# Patient Record
Sex: Female | Born: 1960
Health system: Southern US, Community
[De-identification: ages and names within clinical notes are randomized; demographics above are authoritative.]

## PROBLEM LIST (undated history)

## (undated) DIAGNOSIS — D649 Anemia, unspecified: Secondary | ICD-10-CM

## (undated) DIAGNOSIS — F419 Anxiety disorder, unspecified: Secondary | ICD-10-CM

## (undated) DIAGNOSIS — C787 Secondary malignant neoplasm of liver and intrahepatic bile duct: Secondary | ICD-10-CM

## (undated) DIAGNOSIS — R911 Solitary pulmonary nodule: Secondary | ICD-10-CM

## (undated) DIAGNOSIS — I10 Essential (primary) hypertension: Secondary | ICD-10-CM

## (undated) DIAGNOSIS — C189 Malignant neoplasm of colon, unspecified: Secondary | ICD-10-CM

## (undated) DIAGNOSIS — F329 Major depressive disorder, single episode, unspecified: Secondary | ICD-10-CM

## (undated) DIAGNOSIS — F32A Depression, unspecified: Secondary | ICD-10-CM

## (undated) DIAGNOSIS — D509 Iron deficiency anemia, unspecified: Secondary | ICD-10-CM

## (undated) HISTORY — DX: Essential (primary) hypertension: I10

## (undated) HISTORY — DX: Depression, unspecified: F32.A

## (undated) HISTORY — PX: ABDOMINAL HYSTERECTOMY: SHX81

## (undated) HISTORY — DX: Major depressive disorder, single episode, unspecified: F32.9

## (undated) HISTORY — PX: ENDOMETRIAL ABLATION: SHX621

## (undated) MED FILL — Dexamethasone Sodium Phosphate Inj 100 MG/10ML: INTRAMUSCULAR | Qty: 1 | Status: AC

## (undated) MED FILL — Fluorouracil IV Soln 2.5 GM/50ML (50 MG/ML): INTRAVENOUS | Qty: 12 | Status: AC

## (undated) MED FILL — Fluorouracil IV Soln 500 MG/10ML (50 MG/ML): INTRAVENOUS | Qty: 10 | Status: AC

## (undated) MED FILL — Fluorouracil IV Soln 5 GM/100ML (50 MG/ML): INTRAVENOUS | Qty: 70 | Status: AC

---

## 2014-12-31 ENCOUNTER — Emergency Department (HOSPITAL_COMMUNITY)
Admission: EM | Admit: 2014-12-31 | Discharge: 2014-12-31 | Disposition: A | Discharge status: Home / Self Care | Payer: Self-pay | Attending: Emergency Medicine | Admitting: Emergency Medicine

## 2014-12-31 ENCOUNTER — Emergency Department (HOSPITAL_COMMUNITY): Payer: Self-pay

## 2014-12-31 ENCOUNTER — Encounter (HOSPITAL_COMMUNITY): Payer: Self-pay | Admitting: Emergency Medicine

## 2014-12-31 DIAGNOSIS — W208XXA Other cause of strike by thrown, projected or falling object, initial encounter: Secondary | ICD-10-CM | POA: Insufficient documentation

## 2014-12-31 DIAGNOSIS — Z87891 Personal history of nicotine dependence: Secondary | ICD-10-CM | POA: Insufficient documentation

## 2014-12-31 DIAGNOSIS — Y9289 Other specified places as the place of occurrence of the external cause: Secondary | ICD-10-CM | POA: Insufficient documentation

## 2014-12-31 DIAGNOSIS — Y9389 Activity, other specified: Secondary | ICD-10-CM | POA: Insufficient documentation

## 2014-12-31 DIAGNOSIS — S61219A Laceration without foreign body of unspecified finger without damage to nail, initial encounter: Secondary | ICD-10-CM

## 2014-12-31 DIAGNOSIS — Y998 Other external cause status: Secondary | ICD-10-CM | POA: Insufficient documentation

## 2014-12-31 DIAGNOSIS — S61215A Laceration without foreign body of left ring finger without damage to nail, initial encounter: Secondary | ICD-10-CM | POA: Insufficient documentation

## 2014-12-31 MED ORDER — LIDOCAINE HCL (PF) 1 % IJ SOLN
INTRAMUSCULAR | Status: AC
  Administered 2014-12-31: 15:00:00
  Filled 2014-12-31: qty 5

## 2014-12-31 MED ORDER — BACITRACIN ZINC 500 UNIT/GM EX OINT
TOPICAL_OINTMENT | CUTANEOUS | Status: AC
  Filled 2014-12-31: qty 0.9

## 2014-12-31 MED ORDER — LIDOCAINE HCL (PF) 1 % IJ SOLN
INTRAMUSCULAR | Status: AC
  Filled 2014-12-31: qty 5

## 2014-12-31 MED ORDER — BACITRACIN ZINC 500 UNIT/GM EX OINT
1.0000 "application " | TOPICAL_OINTMENT | Freq: Two times a day (BID) | CUTANEOUS | Status: DC
  Administered 2014-12-31: 1 via TOPICAL

## 2014-12-31 MED ORDER — HYDROCODONE-ACETAMINOPHEN 5-325 MG PO TABS
1.0000 | ORAL_TABLET | Freq: Once | ORAL | Status: AC
  Administered 2014-12-31: 1 via ORAL
  Filled 2014-12-31: qty 1

## 2014-12-31 MED ORDER — CEPHALEXIN 500 MG PO CAPS: 500.0000 mg | ORAL_CAPSULE | Freq: Four times a day (QID) | ORAL | Status: DC

## 2014-12-31 MED ORDER — HYDROCODONE-ACETAMINOPHEN 5-325 MG PO TABS: ORAL_TABLET | ORAL | Status: DC

## 2014-12-31 NOTE — Discharge Instructions (Signed)

## 2014-12-31 NOTE — ED Notes (Signed)
Placed ice pack on patient's left hand. Patient tolerated well.

## 2014-12-31 NOTE — ED Provider Notes (Signed)
CSN: [id number]     Arrival date & time 12/31/14  1214 History   This chart was scribed for Kem Parkinson, PA-C working with Veryl Speak, MD by Mercy Moore, ED Scribe. This patient was seen in room [facility] and the patient's care was started at 1:14 PM.   Chief Complaint  Patient presents with  . Hand Injury   The history is provided by the patient. No language interpreter was used.   HPI Comments: Ayako Tapanes is a 54 y.o. female who presents to the Emergency Department with left hand injury incurred today when dropping a wooden rabbit cage that slide from her hand. Patient sustained laceration to length of left fourth finger. Patient reports severe pain with movement, but maintains sensation and strength to the affected digit.  She denies use of blood thinners, numbness or weakness of the finger   History reviewed. No pertinent past medical history. History reviewed. No pertinent past surgical history. Family History  Problem Relation Age of Onset  . Hypertension Mother   . Diabetes Mother    History  Substance Use Topics  . Smoking status: Former Research scientist (life sciences)  . Smokeless tobacco: Never Used  . Alcohol Use: No   OB History    Gravida Para Term Preterm AB TAB SAB Ectopic Multiple Living   3 2 2  1  1         Review of Systems  Constitutional: Negative for fever and chills.  Musculoskeletal: Positive for arthralgias. Negative for back pain and joint swelling.  Skin: Positive for wound.       Laceration   Neurological: Negative for dizziness, weakness and numbness.  Hematological: Does not bruise/bleed easily.  All other systems reviewed and are negative.     Allergies  Review of patient's allergies indicates no known allergies.  Home Medications   Prior to Admission medications   Not on File   Triage Vitals: BP 162/93 mmHg  Temp(Src) 97.8 F (36.6 C) (Oral)  Resp 19  Ht 5\' 4"  (1.626 m)  Wt 160 lb (72.576 kg)  BMI 27.45 kg/m2  SpO2 100% Physical Exam   Constitutional: She is oriented to person, place, and time. She appears well-developed and well-nourished. No distress.  HENT:  Head: Normocephalic and atraumatic.  Cardiovascular: Normal rate, regular rhythm, normal heart sounds and intact distal pulses.   No murmur heard. Pulmonary/Chest: Effort normal and breath sounds normal. No respiratory distress.  Musculoskeletal: Normal range of motion. She exhibits tenderness. She exhibits no edema.  Patient has full ROM of finger. Large laceration extending lateral aspect of the left fourth finger and small lac to volar mid finger. Minimal bleeding at the distal end.  Distal sensation intact, CR< 2 sec  Neurological: She is alert and oriented to person, place, and time. She exhibits normal muscle tone. Coordination normal.  Skin: Skin is warm and dry. Laceration noted.  Psychiatric: She has a normal mood and affect. Her behavior is normal.  Nursing note and vitals reviewed.   ED Course  Procedures (including critical care time)  LACERATION REPAIR #1 Performed by: Kem Parkinson, PA-C Consent: Verbal consent obtained. Risks and benefits: risks, benefits and alternatives were discussed Patient identity confirmed: provided demographic data Time out performed prior to procedure Prepped and Draped in normal sterile fashion Wound explored Laceration Location: left fourth finger Laceration Length:4 cm No Foreign Bodies seen or palpated Anesthesia: local infiltration, digital block Local anesthetic: lidocaine 1 % w/o epinephrine Anesthetic total: 4 ml Irrigation method: syringe Amount of cleaning:  standard  Skin closure: 4-0 prolene, 6-0 vicryl  Number of sutures or staples: 10 to skin, 2 subcutaneous to control bleeding  Technique: simple interrupted  Patient tolerance: Patient tolerated the procedure well with no immediate complications.    LACERATION REPAIR #2 Performed by: Kem Parkinson, PA-C  Consent: Verbal consent  obtained. Risks and benefits: risks, benefits and alternatives were discussed Patient identity confirmed: provided demographic data Time out performed prior to procedure Prepped and Draped in normal sterile fashion Wound explored Laceration Location: left fourth finger Laceration Length: 1 cm No Foreign Bodies seen or palpated Anesthesia: local infiltration Local anesthetic: lidocaine 1% w/o epinephrine Anesthetic total: 1 ml Irrigation method: syringe Amount of cleaning: standard  Skin closure: 4-0 prolene  Number of sutures or staples: 2  Technique: simple interrupted  Patient tolerance: Patient tolerated the procedure well with no immediate complications.   COORDINATION OF CARE: 1:18 PM- Plans to cleanse/explore wound before repair. Discussed treatment plan with patient at bedside and patient agreed to plan.   Labs Review Labs Reviewed - No data to display  Imaging Review Dg Hand Complete Left  12/31/2014   CLINICAL DATA:  Injured hand today with large laceration.  EXAM: LEFT HAND - COMPLETE 3+ VIEW  COMPARISON:  None.  FINDINGS: The joint spaces are maintained. No acute fracture or radiopaque foreign body.  IMPRESSION: No acute bony findings or radiopaque foreign body.   Electronically Signed   By: Marijo Sanes M.D.   On: 12/31/2014 12:36     EKG Interpretation None      MDM   Final diagnoses:  Finger laceration, initial encounter    Extensive lac to lateral finger.  Bleeding controlled with 2 SQ sutures prior to closure of skin.  No injury to deep structures visualized prior to skin closure.  Pt has full ROM and sensation to the finger.  Pt agrees to wound care instructions and to return here in 2 days for recheck.  Will also rx keflex.  Given return precautions and agrees to return.    Finger bandaged and splinted.  Pain improved.    I personally performed the services described in this documentation, which was scribed in my presence. The recorded information  has been reviewed and is accurate.    Kem Parkinson, PA-C 01/02/15 [place], MD 01/02/15 1530

## 2014-12-31 NOTE — ED Notes (Signed)
Pt reports dropping a rabbit cage on her L hand.

## 2016-02-21 ENCOUNTER — Other Ambulatory Visit (HOSPITAL_COMMUNITY): Payer: Self-pay | Admitting: Pulmonary Disease

## 2016-02-21 DIAGNOSIS — R1907 Generalized intra-abdominal and pelvic swelling, mass and lump: Secondary | ICD-10-CM

## 2016-02-28 ENCOUNTER — Ambulatory Visit (HOSPITAL_COMMUNITY)
Admission: RE | Admit: 2016-02-28 | Discharge: 2016-02-28 | Disposition: A | Discharge status: Home / Self Care | Payer: Self-pay | Source: Ambulatory Visit | Attending: Pulmonary Disease | Admitting: Pulmonary Disease

## 2016-02-28 DIAGNOSIS — R188 Other ascites: Secondary | ICD-10-CM | POA: Insufficient documentation

## 2016-02-28 DIAGNOSIS — D259 Leiomyoma of uterus, unspecified: Secondary | ICD-10-CM | POA: Insufficient documentation

## 2016-02-28 DIAGNOSIS — R1907 Generalized intra-abdominal and pelvic swelling, mass and lump: Secondary | ICD-10-CM

## 2016-02-28 DIAGNOSIS — R938 Abnormal findings on diagnostic imaging of other specified body structures: Secondary | ICD-10-CM | POA: Insufficient documentation

## 2016-03-05 ENCOUNTER — Ambulatory Visit (INDEPENDENT_AMBULATORY_CARE_PROVIDER_SITE_OTHER): Payer: Self-pay | Admitting: Obstetrics and Gynecology

## 2016-03-05 ENCOUNTER — Encounter: Payer: Self-pay | Admitting: Obstetrics and Gynecology

## 2016-03-05 ENCOUNTER — Other Ambulatory Visit: Payer: Self-pay | Admitting: Obstetrics and Gynecology

## 2016-03-05 DIAGNOSIS — R188 Other ascites: Secondary | ICD-10-CM

## 2016-03-05 DIAGNOSIS — D271 Benign neoplasm of left ovary: Secondary | ICD-10-CM | POA: Insufficient documentation

## 2016-03-05 DIAGNOSIS — R1900 Intra-abdominal and pelvic swelling, mass and lump, unspecified site: Secondary | ICD-10-CM

## 2016-03-05 NOTE — Progress Notes (Addendum)
[facility] Visit  03/05/2016          Patient name: Susan Martin MRN [id number]  Date of birth: 1960/12/05  CC & HPI:  Susan Martin is a 55 y.o. female presenting today for persistent abdominal bloating; referred by Dr. Luan Pulling. Pt had abdominal US done on 02/28/16 that showed a small amount of fluid in the uterus, 1.4 mm thick. CT of the abdomen and pelvis with contrast was recommended but she has not yet had this study done. Pt notes recent weight loss. She denies abdominal pain and appetite change. No change in BMs. No alleviating factors noted. She also denies alcohol consumption.   ROS:  ROS Otherwise negative. Patient has had weight loss over the summer which she attributed to outdoor activity she obviously noticed abdominal girth increasing Family history significant that she describes her families having made a habit of needing medical care and she's been that strong one. She was tearful and upset over the possibility that this may represent cancer. I told her I cannot give her specific answers yet but  will walk with her through the investigation  Pertinent History Reviewed:   Reviewed: Significant for endometrial ablation Medical         Past Medical History:  Diagnosis Date  . Depression   . Hypertension                               Surgical Hx:    Past Surgical History:  Procedure Laterality Date  . ENDOMETRIAL ABLATION     Medications: Reviewed & Updated - see associated section                       Current Outpatient Prescriptions:  .  PARoxetine (PAXIL) 10 MG tablet, Take 10 mg by mouth daily., Disp: , Rfl:  .  Plecanatide (TRULANCE) 3 MG TABS, Take 1 tablet by mouth daily., Disp: , Rfl:  .  triamterene-hydrochlorothiazide (DYAZIDE) 37.5-25 MG capsule, Take 1 capsule by mouth daily., Disp: , Rfl:    Social History: Reviewed -  reports that she has quit smoking. She has never used smokeless tobacco.  Objective Findings:  Vitals: There were no vitals taken  for this visit.  Physical Examination: General appearance - alert, well appearing, and in no distress Mental status - alert, oriented to person, place, and time Abdomen - Severely distended abdomen, taut without guarding or rebound Pelvic - examination not indicated   The provider spent Well over 45 minutes with the visit , including previsit review, and documentation,with >than 50% spent in counseling and coordination of care.  Assessment & Plan:   A:  1. Ascites  2. R/o abdomen/pelvis malignancy  P:  1. One CT A/P at [facility] at [place] AM on 02/07/16. Unable to schedule at [facility] 2. Return next week for pap, internal exam, and probale referral  3. ? endometrial biopsy  4 labs ordered CBC metabolic panel [id number] 5. Will need referral to [facility] discount medical assistance program and/or disability  By signing my name below, I, Evelene Croon, attest that this documentation has been prepared under the direction and in the presence of Jonnie Kind, MD . Electronically Signed: Evelene Croon, Scribe. 03/05/2016. 11:29 AM. I personally performed the services described in this documentation, which was SCRIBED in my presence. The recorded information has been reviewed and considered accurate. It has been edited  as necessary during review. Jonnie Kind, MD

## 2016-03-06 LAB — COMPREHENSIVE METABOLIC PANEL
ALK PHOS: 76 IU/L (ref 39–117)
ALT: 8 IU/L (ref 0–32)
AST: 14 IU/L (ref 0–40)
Albumin/Globulin Ratio: 1.2 (ref 1.2–2.2)
Albumin: 4 g/dL (ref 3.5–5.5)
BUN/Creatinine Ratio: 13 (ref 9–23)
BUN: 10 mg/dL (ref 6–24)
Bilirubin Total: 0.3 mg/dL (ref 0.0–1.2)
CALCIUM: 9.7 mg/dL (ref 8.7–10.2)
CO2: 24 mmol/L (ref 18–29)
CREATININE: 0.76 mg/dL (ref 0.57–1.00)
Chloride: 97 mmol/L (ref 96–106)
GFR calc Af Amer: 102 mL/min/{1.73_m2} (ref >59)
GFR, EST NON AFRICAN AMERICAN: 89 mL/min/{1.73_m2} (ref >59)
GLUCOSE: 111 mg/dL — AB (ref 65–99)
Globulin, Total: 3.4 g/dL (ref 1.5–4.5)
Potassium: 4.6 mmol/L (ref 3.5–5.2)
SODIUM: 140 mmol/L (ref 134–144)
Total Protein: 7.4 g/dL (ref 6.0–8.5)

## 2016-03-06 LAB — CBC WITH DIFFERENTIAL/PLATELET
BASOS ABS: 0 10*3/uL (ref 0.0–0.2)
Basos: 0 %
EOS (ABSOLUTE): 0 10*3/uL (ref 0.0–0.4)
Eos: 1 %
Hematocrit: 43.4 % (ref 34.0–46.6)
Hemoglobin: 14.9 g/dL (ref 11.1–15.9)
IMMATURE GRANULOCYTES: 0 %
Immature Grans (Abs): 0 10*3/uL (ref 0.0–0.1)
LYMPHS ABS: 1 10*3/uL (ref 0.7–3.1)
Lymphs: 12 %
MCH: 30.8 pg (ref 26.6–33.0)
MCHC: 34.3 g/dL (ref 31.5–35.7)
MCV: 90 fL (ref 79–97)
MONOS ABS: 0.5 10*3/uL (ref 0.1–0.9)
Monocytes: 7 %
NEUTROS PCT: 80 %
Neutrophils Absolute: 6.5 10*3/uL (ref 1.4–7.0)
PLATELETS: 340 10*3/uL (ref 150–379)
RBC: 4.83 x10E6/uL (ref 3.77–5.28)
RDW: 12.8 % (ref 12.3–15.4)
WBC: 8.1 10*3/uL (ref 3.4–10.8)

## 2016-03-06 LAB — CA 125: CA 125: 29.2 U/mL (ref 0.0–38.1)

## 2016-03-09 ENCOUNTER — Telehealth: Payer: Self-pay | Admitting: Obstetrics and Gynecology

## 2016-03-09 ENCOUNTER — Ambulatory Visit (HOSPITAL_COMMUNITY)
Admission: RE | Admit: 2016-03-09 | Discharge: 2016-03-09 | Disposition: A | Discharge status: Home / Self Care | Payer: Self-pay | Source: Ambulatory Visit | Attending: Obstetrics and Gynecology | Admitting: Obstetrics and Gynecology

## 2016-03-09 ENCOUNTER — Encounter (HOSPITAL_COMMUNITY): Payer: Self-pay

## 2016-03-09 DIAGNOSIS — R918 Other nonspecific abnormal finding of lung field: Secondary | ICD-10-CM | POA: Insufficient documentation

## 2016-03-09 DIAGNOSIS — N9489 Other specified conditions associated with female genital organs and menstrual cycle: Secondary | ICD-10-CM | POA: Insufficient documentation

## 2016-03-09 DIAGNOSIS — R188 Other ascites: Secondary | ICD-10-CM | POA: Insufficient documentation

## 2016-03-09 MED ORDER — IOPAMIDOL (ISOVUE-300) INJECTION 61%
100.0000 mL | Freq: Once | INTRAVENOUS | Status: AC | PRN
  Administered 2016-03-09: 100 mL via INTRAVENOUS

## 2016-03-11 ENCOUNTER — Ambulatory Visit (INDEPENDENT_AMBULATORY_CARE_PROVIDER_SITE_OTHER): Payer: Self-pay | Admitting: Obstetrics and Gynecology

## 2016-03-11 ENCOUNTER — Encounter: Payer: Self-pay | Admitting: Obstetrics and Gynecology

## 2016-03-11 ENCOUNTER — Other Ambulatory Visit (HOSPITAL_COMMUNITY)
Admission: RE | Admit: 2016-03-11 | Discharge: 2016-03-11 | Disposition: A | Discharge status: Home / Self Care | Payer: Self-pay | Source: Ambulatory Visit | Attending: Obstetrics and Gynecology | Admitting: Obstetrics and Gynecology

## 2016-03-11 VITALS — BP 130/82 | HR 80 | Ht 63.0 in | Wt 149.6 lb

## 2016-03-11 DIAGNOSIS — Z01818 Encounter for other preprocedural examination: Secondary | ICD-10-CM

## 2016-03-11 DIAGNOSIS — K5909 Other constipation: Secondary | ICD-10-CM

## 2016-03-11 DIAGNOSIS — D3911 Neoplasm of uncertain behavior of right ovary: Secondary | ICD-10-CM

## 2016-03-11 DIAGNOSIS — Z1151 Encounter for screening for human papillomavirus (HPV): Secondary | ICD-10-CM | POA: Insufficient documentation

## 2016-03-11 DIAGNOSIS — Z124 Encounter for screening for malignant neoplasm of cervix: Secondary | ICD-10-CM

## 2016-03-11 DIAGNOSIS — Z01419 Encounter for gynecological examination (general) (routine) without abnormal findings: Secondary | ICD-10-CM | POA: Insufficient documentation

## 2016-03-11 DIAGNOSIS — R1900 Intra-abdominal and pelvic swelling, mass and lump, unspecified site: Secondary | ICD-10-CM

## 2016-03-11 DIAGNOSIS — Z113 Encounter for screening for infections with a predominantly sexual mode of transmission: Secondary | ICD-10-CM | POA: Insufficient documentation

## 2016-03-11 NOTE — Progress Notes (Addendum)
Patient ID: Susan Martin, female   DOB: September 18, 1960, 55 y.o.   MRN: [id number]   [facility] Visit  @DATE @            Patient name: Susan Martin MRN [id number]  Date of birth: 12-Apr-1961  CC & HPI:   Chief Complaint  Patient presents with  . Follow-up    discuss CT results and plan     Susan Martin is a 55 y.o. female presenting today for f/u of CT A/P results from 03/09/16. She currently complains of persistent abdominal bloating and constipation. No h/o PE/DVT. She denies leg swelling.  Pt informed of my discussions of case with Dr Denman George this a.m., with proposed surgical planning discussed. Per CT:  1. Large complex cystic mass arising from the pelvis and occupying the near entirety of the peritoneal space of the pelvis and abdomen. 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 Mass is extremely large with a calculated volume of 10 liters. 2. Enhancing tissue in the LEFT adnexa measuring 2.5 by 3.9 by 4.6 cm. 3. No evidence of peritoneal metastasis. 4. Subpleural nodules at the lung bases are indeterminate. Recommend attention on follow-up.  ROS:  Review of Systems  Cardiovascular: Negative for leg swelling.  Gastrointestinal: Positive for constipation.       +persistent abdominal bloating   Pertinent History Reviewed:   Reviewed: Significant for endometrial ablation   Medical         Past Medical History:  Diagnosis Date  . Depression   . Hypertension                               Surgical Hx:    Past Surgical History:  Procedure Laterality Date  . ENDOMETRIAL ABLATION     Medications: Reviewed & Updated - see associated section                       Current Outpatient Prescriptions:  .  PARoxetine (PAXIL) 10 MG tablet, Take 10 mg by mouth daily., Disp: , Rfl:  .  triamterene-hydrochlorothiazide (DYAZIDE) 37.5-25 MG capsule, Take 1 capsule by mouth daily., Disp: , Rfl:  .  Plecanatide  (TRULANCE) 3 MG TABS, Take 1 tablet by mouth daily., Disp: , Rfl:    Social History: Reviewed -  reports that she has quit smoking. She has never used smokeless tobacco.  Objective Findings:  Vitals: Blood pressure 130/82, pulse 80, height 5\' 3"  (1.6 m), weight 149 lb 9.6 oz (67.9 kg).  Physical Examination: General appearance - alert, well appearing, and in no distress Mental status - alert, oriented to person, place, and time Heart- RRR Lungs- Lungs CTA bilaterally.  Breasts- Symmetric in size. No masses, skin changes, nipple drainage, or lymphadenopathy. Abdomen - soft, nontender,  Pelvic -  VULVA: normal appearing vulva with no masses, tenderness or lesions,  VAGINA: normal appearing vagina for age with normal color and discharge, no lesions,  CERVIX: normal appearing cervix without discharge or lesions, normal appearance, pap collected  UTERUS: acutely anteflexed, unable to assess d/t position  Musculoskeletal - no joint tenderness, deformity or swelling Extremities - peripheral pulses normal, no pedal edema, no clubbing or cyanosis Skin - normal coloration and turgor, no rashes, no suspicious skin lesions noted    Endometrial Biopsy: Patient given informed consent, signed copy in the chart, time out was  performed. Time out taken. The patient was placed in the lithotomy position and the cervix brought into view with sterile speculum.  Portio of cervix cleansed x 2 with betadine swabs.  A tenaculum was placed in the anterior lip of the cervix. The uterus was sounded for depth of 4 cm. Unable to complete the biopsy d/t stenotic endometrial cavity and uterus position.   Followup: as scheduled for surgery   _____________________________________________________________  Discussed with pt risks and benefits of cyst excision with or without unilateral or bilateral salping oophorectomy. Pt informed that pathology reports during surgical procedure will indicate which procedure is  necessary. Pt was offered referral to have surgery with gynecologic oncologist, Dr. Denman George, and she declines this option At end of discussion, pt had opportunity to ask questions and has no further questions at this time. Pt reports she is comfortable proceeding with surgery at [facility] with myself.   Greater than 50% was spent in counseling and coordination of care with the patient. Total time greater than: 45 minutes   Assessment & Plan:   A:  1. Encounter for discussion of CT results, surgical options  2. 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 Pap smear collected today, endometrial biopsy unsuccessful  4. Case discussed with GYN gynecological oncologist, Dr. Denman George, who agrees with treatment plan.   P:  1. Schedule cyst excision with salping oophorectomy and possible hysterectomy, with frozen section 03/31/16 2. Advised pt to use OTC Miralax to alleviate constipation     By signing my name below, I, Hansel Feinstein, attest that this documentation has been prepared under the direction and in the presence of Jonnie Kind, MD. Electronically Signed: Hansel Feinstein, ED Scribe. 03/11/16. 8:55 AM.   I personally performed the services described in this documentation, which was SCRIBED in my presence. The recorded information has been reviewed and considered accurate. It has been edited as necessary during review. Jonnie Kind, MD

## 2016-03-12 LAB — CYTOLOGY - PAP

## 2016-03-12 NOTE — Telephone Encounter (Signed)
Pt notified of CT findings, has appt

## 2016-03-26 NOTE — Patient Instructions (Signed)
Susan Martin  03/26/2016     @PREFPERIOPPHARMACY @   Your procedure is scheduled on  03/31/2016   Report to [facility] at  615  A.M.  Call this number if you have problems the morning of surgery:  [phone]   Remember:  Do not eat food or drink liquids after midnight.  Take these medicines the morning of surgery with A SIP OF WATER  Paxil, triamterene.   Do not wear jewelry, make-up or nail polish.  Do not wear lotions, powders, or perfumes, or deoderant.  Do not shave 48 hours prior to surgery.  Men may shave face and neck.  Do not bring valuables to the hospital.  [facility] is not responsible for any belongings or valuables.  Contacts, dentures or bridgework may not be worn into surgery.  Leave your suitcase in the car.  After surgery it may be brought to your room.  For patients admitted to the hospital, discharge time will be determined by your treatment team.  Patients discharged the day of surgery will not be allowed to drive home.   Name and phone number of your driver:   family Special instructions:  Follow the diet and prep instructions given to you by Dr Glo Herring.  Please read over the following fact sheets that you were given. Anesthesia Post-op Instructions and Care and Recovery After Surgery       Abdominal Hysterectomy, Care After These instructions give you information on caring for yourself after your procedure. Your doctor may also give you more specific instructions. Call your doctor if you have any problems or questions after your procedure.  HOME CARE It takes 4-6 weeks to recover from this surgery. Follow all of your doctor's instructions.   Only take medicines as told by your doctor.  Change your bandage as told by your doctor.  Return to your doctor to have your stitches taken out.  Take showers for 2-3 weeks. Ask your doctor when it is okay to shower.  Do not douche, use tampons, or have sex (intercourse) for at  least 6 weeks or as told.  Follow your doctor's advice about exercise, lifting objects, driving, and general activities.  Get plenty of rest and sleep.  Do not lift anything heavier than a gallon of milk (about 10 pounds [4.5 kilograms]) for the first month after surgery.  Get back to your normal diet as told by your doctor.  Do not drink alcohol until your doctor says it is okay.  Take a medicine to help you poop (laxative) as told by your doctor.  Eating foods high in fiber may help you poop. Eat a lot of raw fruits and vegetables, whole grains, and beans.  Drink enough fluids to keep your pee (urine) clear or pale yellow.  Have someone help you at home for 1-2 weeks after your surgery.  Keep follow-up doctor visits as told. GET HELP IF:  You have chills or fever.  You have puffiness, redness, or pain in area of the cut (incision).  You have yellowish-white fluid (pus) coming from the cut.  You have a bad smell coming from the cut or bandage.  Your cut pulls apart.  You feel dizzy or light-headed.  You have pain or bleeding when you pee.  You keep having watery poop (diarrhea).  You keep feeling sick to your stomach (nauseous) or keep throwing up (vomiting).  You have fluid (discharge) coming from  your vagina.  You have a rash.  You have a reaction to your medicine.  You need stronger pain medicine. GET HELP RIGHT AWAY IF:   You have a fever and your symptoms suddenly get worse.  You have bad belly (abdominal) pain.  You have chest pain.  You are short of breath.  You pass out (faint).  You have pain, puffiness, or redness of your leg.  You bleed a lot from your vagina and notice clumps of tissue (clots). MAKE SURE YOU:   Understand these instructions.  Will watch your condition.  Will get help right away if you are not doing well or get worse.   This information is not intended to replace advice given to you by your health care provider. Make  sure you discuss any questions you have with your health care provider.   Document Released: 03/24/2008 Document Revised: 06/20/2013 Document Reviewed: 04/07/2013 Elsevier Interactive Patient Education 2016 [employer]  Abdominal Hysterectomy Abdominal hysterectomy is a surgery to remove your womb (uterus). Your womb is the part of your body that contains a growing baby. The surgery may be done for many reasons. These may include cancer, growths (tumors), long-term pain, or bleeding. You may also need other reproductive parts removed during this surgery. This will depend on why you need to have the surgery. BEFORE THE PROCEDURE  Talk to your doctor about the changes to your body. These changes may be physical and emotional.  You may need to have blood work done. You may also need X-rays done.  Quit smoking if you smoke. Ask your doctor for help.  Stop taking medicines that thin your blood as told by your doctor.  Your doctor may have you take other medicines. Take all medicines as told by your doctor.  Do not eat or drink anything for 6-8 hours before surgery.  Take your normal medicines with a small sip of water.  Shower or take a bath the night or morning before surgery. PROCEDURE  This surgery is done in the hospital.  You are given a medicine that makes you go to sleep (general anesthetic).  The doctor will make a cut (incision) through the skin in your lower belly.  The cut may be about 5-7 inches long. It may go side-to-side or up-and-down.  The doctor will move the body tissue that covers your womb. The doctor will carefully remove your womb. The doctor may remove any other reproductive parts that need to be removed.  The doctor will use clamps or stitches (sutures) to control bleeding.  The doctor will close your cut with stitches or metal clips. AFTER THE PROCEDURE  You will have pain right after the procedure.  You will be given pain medicine in the recovery  room.  You will be taken to your hospital room after the medicines that made you go to sleep wear off.  You will be told how to take care of yourself at home.   This information is not intended to replace advice given to you by your health care provider. Make sure you discuss any questions you have with your health care provider.   Document Released: 06/20/2013 Document Reviewed: 06/20/2013 Elsevier Interactive Patient Education 2016 [employer]  Bilateral Salpingo-Oophorectomy Bilateral salpingo-oophorectomy is the surgical removal of both fallopian tubes and both ovaries. The ovaries are small organs that produce eggs in women. The fallopian tubes transport the egg from the ovary to the womb (uterus). Usually, when this surgery is done, the uterus  was previously removed. A bilateral salpingo-oophorectomy may be done to treat cancer or to reduce the risk of cancer in women who are at high risk. Removing both fallopian tubes and both ovaries will make you unable to become pregnant (sterile). It will also put you into menopause so that you will no longer have menstrual periods and may have menopausal symptoms such as hot flashes, night sweats, and mood changes. It will not affect your sex drive. LET [facility] CARE PROVIDER KNOW ABOUT:  Any allergies you have.  All medicines you are taking, including vitamins, herbs, eye drops, creams, and over-the-counter medicines.  Previous problems you or members of your family have had with the use of anesthetics.  Any blood disorders you have.  Previous surgeries you have had.  Medical conditions you have. RISKS AND COMPLICATIONS Generally, this is a safe procedure. However, as with any procedure, complications can occur. Possible complications include:  Injury to surrounding organs.  Bleeding.  Infection.  Blood clots in the legs or lungs.  Problems related to anesthesia. BEFORE THE PROCEDURE  Ask your health care provider about  changing or stopping your regular medicines. You may need to stop taking certain medicines, such as aspirin or blood thinners, at least 1 week before the surgery.  Do not eat or drink anything for at least 8 hours before the surgery.  If you smoke, do not smoke for at least 2 weeks before the surgery.  Make plans to have someone drive you home after the procedure or after your hospital stay. Also arrange for someone to help you with activities during recovery. PROCEDURE   You will be given medicine to help you relax before the procedure (sedative). You will then be given medicine to make you sleep through the procedure (general anesthetic). These medicines will be given through an IV access tube that is put into one of your veins.  Once you are asleep, your lower abdomen will be shaved and cleaned. A thin, flexible tube (catheter) will be placed in your bladder.  The surgeon may use a laparoscopic, robotic, or open technique for this surgery:  In the laparoscopic technique, the surgery is done through two small cuts (incisions) in the abdomen. A thin, lighted tube with a tiny camera on the end (laparoscope) is inserted into one of the incisions. The tools needed for the procedure are put through the other incision.  A robotic technique may be chosen to perform complex surgery in a small space. In the robotic technique, small incisions will be made. A camera and surgical instruments are passed through the incisions. Surgical instruments will be controlled with the help of a robotic arm.  In the open technique, the surgery is done through one large incision in the abdomen.  Using any of these techniques, the surgeon removes the fallopian tubes and ovaries. The blood vessels will be clamped and tied.  The surgeon then uses staples or stitches to close the incision or incisions. AFTER THE PROCEDURE  You will be taken to a recovery area where you will be monitored for 1 to 3 hours. Your blood  pressure, pulse, and temperature will be checked often. You will remain in the recovery area until you are stable and waking up.  If the laparoscopic technique was used, you may be allowed to go home after several hours. You may have some shoulder pain after the laparoscopic procedure. This is normal and usually goes away in a day or two.  If the open technique  was used, you will be admitted to the hospital for a couple of days.  You will be given pain medicine as needed.  The IV access tube and catheter will be removed before you are discharged.   This information is not intended to replace advice given to you by your health care provider. Make sure you discuss any questions you have with your health care provider.   Document Released: 06/15/2005 Document Revised: 06/20/2013 Document Reviewed: 12/07/2012 Elsevier Interactive Patient Education 2016 Elsevier Inc[employer] Bilateral Salpingo-Oophorectomy, Care After Refer to this sheet in the next few weeks. These instructions provide you with information on caring for yourself after your procedure. Your health care provider may also give you more specific instructions. Your treatment has been planned according to current medical practices, but problems sometimes occur. Call your health care provider if you have any problems or questions after your procedure. WHAT TO EXPECT AFTER THE PROCEDURE After your procedure, it is typical to have the following:   Abdominal pain that can be controlled with medicine.  Vaginal spotting.  Constipation.  Menopausal symptoms such as hot flashes, vaginal dryness, and mood swings. HOME CARE INSTRUCTIONS   Get plenty of rest and sleep.  Only take over-the-counter or prescription medicines as directed by your health care provider. Do not take aspirin. It can cause bleeding.  Keep incision areas clean and dry. Remove or change bandages (dressings) only as directed by your health care provider.  Take showers  instead of baths for a few weeks as directed by your health care provider.  Limit exercise and activities as directed by your health care provider. Do not lift anything heavier than 5 pounds (2.3 kg) until your health care provider approves.  Do not drive until your health care provider approves.  Follow your health care provider's advice regarding diet. You may be able to resume your usual diet right away.  Drink enough fluids to keep your urine clear or pale yellow.  Do not douche, use tampons, or have sexual intercourse for 6 weeks after the procedure.  Do not drink alcohol until your health care provider says it is okay.  Take your temperature twice a day and write it down.  If you become constipated, you may:  Ask your health care provider about taking a mild laxative.  Add more fruit and bran to your diet.  Drink more fluids.  Follow up with your health care provider as directed. SEEK MEDICAL CARE IF:   You have swelling, redness, or increasing pain in the incision area.  You see pus coming from the incision area.  You notice a bad smell coming from the wound or dressing.  You have pain, redness, or swelling where the IV access tube was placed.  Your incision is breaking open (the edges are not staying together).  You feel dizzy or feel like fainting.  You develop pain or bleeding when you urinate.  You develop diarrhea.  You develop nausea and vomiting.  You develop abnormal vaginal discharge.  You develop a rash.  You have pain that is not controlled with medicine. SEEK IMMEDIATE MEDICAL CARE IF:   You develop a fever.  You develop abdominal pain.  You have chest pain.  You develop shortness of breath.  You pass out.  You develop pain, swelling, or redness in your leg.  You develop heavy vaginal bleeding with or without blood clots.   This information is not intended to replace advice given to you by your health care provider.  Make sure you  discuss any questions you have with your health care provider.   Document Released: 06/15/2005 Document Revised: 02/15/2013 Document Reviewed: 12/07/2012 Elsevier Interactive Patient Education 2016 [place] Anesthesia, Adult General anesthesia is a sleep-like state of non-feeling produced by medicines (anesthetics). General anesthesia prevents you from being alert and feeling pain during a medical procedure. Your caregiver may recommend general anesthesia if your procedure:  Is long.  Is painful or uncomfortable.  Would be frightening to see or hear.  Requires you to be still.  Affects your breathing.  Causes significant blood loss. LET YOUR CAREGIVER KNOW ABOUT:  Allergies to food or medicine.  Medicines taken, including vitamins, herbs, eyedrops, over-the-counter medicines, and creams.  Use of steroids (by mouth or creams).  Previous problems with anesthetics or numbing medicines, including problems experienced by relatives.  History of bleeding problems or blood clots.  Previous surgeries and types of anesthetics received.  Possibility of pregnancy, if this applies.  Use of cigarettes, alcohol, or illegal drugs.  Any health condition(s), especially diabetes, sleep apnea, and high blood pressure. RISKS AND COMPLICATIONS General anesthesia rarely causes complications. However, if complications do occur, they can be life threatening. Complications include:  A lung infection.  A stroke.  A heart attack.  Waking up during the procedure. When this occurs, the patient may be unable to move and communicate that he or she is awake. The patient may feel severe pain. Older adults and adults with serious medical problems are more likely to have complications than adults who are young and healthy. Some complications can be prevented by answering all of your caregiver's questions thoroughly and by following all pre-procedure instructions. It is important to tell your  caregiver if any of the pre-procedure instructions, especially those related to diet, were not followed. Any food or liquid in the stomach can cause problems when you are under general anesthesia. BEFORE THE PROCEDURE  Ask your caregiver if you will have to spend the night at the hospital. If you will not have to spend the night, arrange to have an adult drive you and stay with you for 24 hours.  Follow your caregiver's instructions if you are taking dietary supplements or medicines. Your caregiver may tell you to stop taking them or to reduce your dosage.  Do not smoke for as long as possible before your procedure. If possible, stop smoking 3-6 weeks before the procedure.  Do not take new dietary supplements or medicines within 1 week of your procedure unless your caregiver approves them.  Do not eat within 8 hours of your procedure or as directed by your caregiver. Drink only clear liquids, such as water, black coffee (without milk or cream), and fruit juices (without pulp).  Do not drink within 3 hours of your procedure or as directed by your caregiver.  You may brush your teeth on the morning of the procedure, but make sure to spit out the toothpaste and water when finished. PROCEDURE  You will receive anesthetics through a mask, through an intravenous (IV) access tube, or through both. A doctor who specializes in anesthesia (anesthesiologist) or a nurse who specializes in anesthesia (nurse anesthetist) or both will stay with you throughout the procedure to make sure you remain unconscious. He or she will also watch your blood pressure, pulse, and oxygen levels to make sure that the anesthetics do not cause any problems. Once you are asleep, a breathing tube or mask may be used to help you breathe. AFTER THE  PROCEDURE You will wake up after the procedure is complete. You may be in the room where the procedure was performed or in a recovery area. You may have a sore throat if a breathing tube  was used. You may also feel:  Dizzy.  Weak.  Drowsy.  Confused.  Nauseous.  Cold. These are all normal responses and can be expected to last for up to 24 hours after the procedure is complete. A caregiver will tell you when you are ready to go home. This will usually be when you are fully awake and in stable condition.   This information is not intended to replace advice given to you by your health care provider. Make sure you discuss any questions you have with your health care provider.   Document Released: 09/22/2007 Document Revised: 07/06/2014 Document Reviewed: 10/14/2011 Elsevier Interactive Patient Education 2016 [place] Anesthesia, Adult, Care After Refer to this sheet in the next few weeks. These instructions provide you with information on caring for yourself after your procedure. Your health care provider may also give you more specific instructions. Your treatment has been planned according to current medical practices, but problems sometimes occur. Call your health care provider if you have any problems or questions after your procedure. WHAT TO EXPECT AFTER THE PROCEDURE After the procedure, it is typical to experience:  Sleepiness.  Nausea and vomiting. HOME CARE INSTRUCTIONS  For the first 24 hours after general anesthesia:  Have a responsible person with you.  Do not drive a car. If you are alone, do not take public transportation.  Do not drink alcohol.  Do not take medicine that has not been prescribed by your health care provider.  Do not sign important papers or make important decisions.  You may resume a normal diet and activities as directed by your health care provider.  Change bandages (dressings) as directed.  If you have questions or problems that seem related to general anesthesia, call the hospital and ask for the anesthetist or anesthesiologist on call. SEEK MEDICAL CARE IF:  You have nausea and vomiting that continue the  day after anesthesia.  You develop a rash. SEEK IMMEDIATE MEDICAL CARE IF:   You have difficulty breathing.  You have chest pain.  You have any allergic problems.   This information is not intended to replace advice given to you by your health care provider. Make sure you discuss any questions you have with your health care provider.   Document Released: 09/21/2000 Document Revised: 07/06/2014 Document Reviewed: 10/14/2011 Elsevier Interactive Patient Education [employer].

## 2016-03-27 ENCOUNTER — Encounter (HOSPITAL_COMMUNITY): Payer: Self-pay

## 2016-03-27 ENCOUNTER — Other Ambulatory Visit: Payer: Self-pay

## 2016-03-27 ENCOUNTER — Encounter (HOSPITAL_COMMUNITY)
Admission: RE | Admit: 2016-03-27 | Discharge: 2016-03-27 | Disposition: A | Discharge status: Home / Self Care | Payer: Self-pay | Source: Ambulatory Visit | Attending: Obstetrics and Gynecology | Admitting: Obstetrics and Gynecology

## 2016-03-27 DIAGNOSIS — R9431 Abnormal electrocardiogram [ECG] [EKG]: Secondary | ICD-10-CM | POA: Insufficient documentation

## 2016-03-27 DIAGNOSIS — N83202 Unspecified ovarian cyst, left side: Secondary | ICD-10-CM | POA: Insufficient documentation

## 2016-03-27 DIAGNOSIS — Z01818 Encounter for other preprocedural examination: Secondary | ICD-10-CM | POA: Insufficient documentation

## 2016-03-27 DIAGNOSIS — Z01810 Encounter for preprocedural cardiovascular examination: Secondary | ICD-10-CM | POA: Insufficient documentation

## 2016-03-27 HISTORY — DX: Anemia, unspecified: D64.9

## 2016-03-27 LAB — COMPREHENSIVE METABOLIC PANEL
ALT: 11 U/L — AB (ref 14–54)
ANION GAP: 7 (ref 5–15)
AST: 16 U/L (ref 15–41)
Albumin: 3.6 g/dL (ref 3.5–5.0)
Alkaline Phosphatase: 66 U/L (ref 38–126)
BUN: 10 mg/dL (ref 6–20)
CHLORIDE: 99 mmol/L — AB (ref 101–111)
CO2: 29 mmol/L (ref 22–32)
CREATININE: 0.73 mg/dL (ref 0.44–1.00)
Calcium: 8.9 mg/dL (ref 8.9–10.3)
Glucose, Bld: 103 mg/dL — ABNORMAL HIGH (ref 65–99)
POTASSIUM: 4.1 mmol/L (ref 3.5–5.1)
Sodium: 135 mmol/L (ref 135–145)
TOTAL PROTEIN: 7.3 g/dL (ref 6.5–8.1)
Total Bilirubin: 0.6 mg/dL (ref 0.3–1.2)

## 2016-03-27 LAB — CBC
HCT: 41.1 % (ref 36.0–46.0)
Hemoglobin: 13.9 g/dL (ref 12.0–15.0)
MCH: 30.8 pg (ref 26.0–34.0)
MCHC: 33.8 g/dL (ref 30.0–36.0)
MCV: 90.9 fL (ref 78.0–100.0)
PLATELETS: 297 10*3/uL (ref 150–400)
RBC: 4.52 MIL/uL (ref 3.87–5.11)
RDW: 12.7 % (ref 11.5–15.5)
WBC: 7.1 10*3/uL (ref 4.0–10.5)

## 2016-03-27 LAB — TYPE AND SCREEN
ABO/RH(D): A POS
ANTIBODY SCREEN: NEGATIVE

## 2016-03-27 LAB — URINALYSIS, ROUTINE W REFLEX MICROSCOPIC
GLUCOSE, UA: NEGATIVE mg/dL
Hgb urine dipstick: NEGATIVE
KETONES UR: 15 mg/dL — AB
LEUKOCYTES UA: NEGATIVE
NITRITE: NEGATIVE
PH: 5.5 (ref 5.0–8.0)
SPECIFIC GRAVITY, URINE: 1.025 (ref 1.005–1.030)

## 2016-03-27 LAB — URINE MICROSCOPIC-ADD ON: WBC UA: NONE SEEN WBC/hpf (ref 0–5)

## 2016-03-27 LAB — HCG, SERUM, QUALITATIVE: Preg, Serum: NEGATIVE

## 2016-03-29 LAB — RPR: RPR Ser Ql: NONREACTIVE

## 2016-03-31 ENCOUNTER — Encounter (HOSPITAL_COMMUNITY)
Admission: RE | Disposition: A | Discharge status: Home / Self Care | Payer: Self-pay | Source: Ambulatory Visit | Attending: Obstetrics and Gynecology

## 2016-03-31 ENCOUNTER — Encounter (HOSPITAL_COMMUNITY): Payer: Self-pay | Admitting: *Deleted

## 2016-03-31 ENCOUNTER — Ambulatory Visit (HOSPITAL_COMMUNITY): Payer: Self-pay | Admitting: Anesthesiology

## 2016-03-31 ENCOUNTER — Inpatient Hospital Stay (HOSPITAL_COMMUNITY)
Admission: RE | Admit: 2016-03-31 | Discharge: 2016-04-02 | DRG: 743 | Disposition: A | Discharge status: Home / Self Care | Payer: Self-pay | Source: Ambulatory Visit | Attending: Obstetrics and Gynecology | Admitting: Obstetrics and Gynecology

## 2016-03-31 DIAGNOSIS — I10 Essential (primary) hypertension: Secondary | ICD-10-CM | POA: Diagnosis present

## 2016-03-31 DIAGNOSIS — D271 Benign neoplasm of left ovary: Principal | ICD-10-CM | POA: Diagnosis present

## 2016-03-31 DIAGNOSIS — K5900 Constipation, unspecified: Secondary | ICD-10-CM | POA: Diagnosis present

## 2016-03-31 DIAGNOSIS — Z87891 Personal history of nicotine dependence: Secondary | ICD-10-CM

## 2016-03-31 DIAGNOSIS — Z23 Encounter for immunization: Secondary | ICD-10-CM

## 2016-03-31 HISTORY — PX: SALPINGOOPHORECTOMY: SHX82

## 2016-03-31 LAB — CBC
HCT: 38.4 % (ref 36.0–46.0)
Hemoglobin: 12.9 g/dL (ref 12.0–15.0)
MCH: 30.6 pg (ref 26.0–34.0)
MCHC: 33.6 g/dL (ref 30.0–36.0)
MCV: 91 fL (ref 78.0–100.0)
PLATELETS: 262 10*3/uL (ref 150–400)
RBC: 4.22 MIL/uL (ref 3.87–5.11)
RDW: 12.6 % (ref 11.5–15.5)
WBC: 8.8 10*3/uL (ref 4.0–10.5)

## 2016-03-31 LAB — CREATININE, SERUM
CREATININE: 0.72 mg/dL (ref 0.44–1.00)
GFR calc Af Amer: >60 mL/min (ref >60)

## 2016-03-31 SURGERY — SALPINGO-OOPHORECTOMY, OPEN
Anesthesia: General | Site: Abdomen | Laterality: Left

## 2016-03-31 MED ORDER — HYDROMORPHONE HCL 1 MG/ML IJ SOLN
1.0000 mg | INTRAMUSCULAR | Status: DC | PRN
  Administered 2016-03-31 – 2016-04-02 (×4): 1 mg via INTRAVENOUS
  Filled 2016-03-31 (×5): qty 1

## 2016-03-31 MED ORDER — BUPIVACAINE-EPINEPHRINE 0.5% -1:200000 IJ SOLN
INTRAMUSCULAR | Status: DC | PRN
  Administered 2016-03-31: 9 mL

## 2016-03-31 MED ORDER — MIDAZOLAM HCL 2 MG/2ML IJ SOLN
INTRAMUSCULAR | Status: AC
  Filled 2016-03-31: qty 2

## 2016-03-31 MED ORDER — PHENYLEPHRINE HCL 10 MG/ML IJ SOLN
INTRAMUSCULAR | Status: AC
  Filled 2016-03-31: qty 1

## 2016-03-31 MED ORDER — CEFAZOLIN SODIUM-DEXTROSE 2-4 GM/100ML-% IV SOLN
2.0000 g | INTRAVENOUS | Status: AC
  Administered 2016-03-31: 2 g via INTRAVENOUS
  Filled 2016-03-31: qty 100

## 2016-03-31 MED ORDER — SUGAMMADEX SODIUM 500 MG/5ML IV SOLN
INTRAVENOUS | Status: AC
  Filled 2016-03-31: qty 5

## 2016-03-31 MED ORDER — ROCURONIUM BROMIDE 100 MG/10ML IV SOLN
INTRAVENOUS | Status: DC | PRN
  Administered 2016-03-31: 10 mg via INTRAVENOUS
  Administered 2016-03-31: 20 mg via INTRAVENOUS

## 2016-03-31 MED ORDER — EPHEDRINE SULFATE 50 MG/ML IJ SOLN
INTRAMUSCULAR | Status: AC
  Filled 2016-03-31: qty 1

## 2016-03-31 MED ORDER — ONDANSETRON HCL 4 MG/2ML IJ SOLN: 4.0000 mg | Freq: Four times a day (QID) | INTRAMUSCULAR | Status: DC | PRN

## 2016-03-31 MED ORDER — PANTOPRAZOLE SODIUM 40 MG PO TBEC
40.0000 mg | DELAYED_RELEASE_TABLET | Freq: Every day | ORAL | Status: DC
  Administered 2016-04-01 – 2016-04-02 (×2): 40 mg via ORAL
  Filled 2016-03-31 (×2): qty 1

## 2016-03-31 MED ORDER — FENTANYL CITRATE (PF) 100 MCG/2ML IJ SOLN
INTRAMUSCULAR | Status: DC | PRN
  Administered 2016-03-31 (×2): 50 ug via INTRAVENOUS
  Administered 2016-03-31: 100 ug via INTRAVENOUS
  Administered 2016-03-31 (×2): 50 ug via INTRAVENOUS
  Administered 2016-03-31: 100 ug via INTRAVENOUS

## 2016-03-31 MED ORDER — ONDANSETRON HCL 4 MG PO TABS: 4.0000 mg | ORAL_TABLET | Freq: Four times a day (QID) | ORAL | Status: DC | PRN

## 2016-03-31 MED ORDER — SUGAMMADEX SODIUM 500 MG/5ML IV SOLN
INTRAVENOUS | Status: DC | PRN
  Administered 2016-03-31: 300 mg via INTRAVENOUS

## 2016-03-31 MED ORDER — 0.9 % SODIUM CHLORIDE (POUR BTL) OPTIME
TOPICAL | Status: DC | PRN
  Administered 2016-03-31: 1000 mL
  Administered 2016-03-31: 2000 mL

## 2016-03-31 MED ORDER — PROPOFOL 10 MG/ML IV BOLUS
INTRAVENOUS | Status: AC
  Filled 2016-03-31: qty 20

## 2016-03-31 MED ORDER — ONDANSETRON HCL 4 MG/2ML IJ SOLN
4.0000 mg | Freq: Once | INTRAMUSCULAR | Status: AC
  Administered 2016-03-31: 4 mg via INTRAVENOUS

## 2016-03-31 MED ORDER — KETOROLAC TROMETHAMINE 30 MG/ML IJ SOLN: 30.0000 mg | Freq: Four times a day (QID) | INTRAMUSCULAR | Status: DC

## 2016-03-31 MED ORDER — PROPOFOL 10 MG/ML IV BOLUS
INTRAVENOUS | Status: DC | PRN
  Administered 2016-03-31: 200 mg via INTRAVENOUS

## 2016-03-31 MED ORDER — SODIUM CHLORIDE 0.9 % IV SOLN
INTRAVENOUS | Status: DC
  Administered 2016-03-31 – 2016-04-01 (×4): via INTRAVENOUS

## 2016-03-31 MED ORDER — OXYCODONE-ACETAMINOPHEN 5-325 MG PO TABS
1.0000 | ORAL_TABLET | ORAL | Status: DC | PRN
  Administered 2016-04-01 – 2016-04-02 (×5): 2 via ORAL
  Filled 2016-03-31 (×5): qty 2

## 2016-03-31 MED ORDER — KETOROLAC TROMETHAMINE 30 MG/ML IJ SOLN
30.0000 mg | Freq: Once | INTRAMUSCULAR | Status: AC
  Administered 2016-03-31: 30 mg via INTRAVENOUS

## 2016-03-31 MED ORDER — LACTATED RINGERS IV SOLN
INTRAVENOUS | Status: DC
  Administered 2016-03-31 (×3): via INTRAVENOUS

## 2016-03-31 MED ORDER — INFLUENZA VAC SPLIT QUAD 0.5 ML IM SUSY
0.5000 mL | PREFILLED_SYRINGE | INTRAMUSCULAR | Status: AC
  Administered 2016-04-01: 0.5 mL via INTRAMUSCULAR
  Filled 2016-03-31: qty 0.5

## 2016-03-31 MED ORDER — FENTANYL CITRATE (PF) 250 MCG/5ML IJ SOLN
INTRAMUSCULAR | Status: AC
  Filled 2016-03-31: qty 5

## 2016-03-31 MED ORDER — HYDROMORPHONE HCL 1 MG/ML IJ SOLN
0.2500 mg | INTRAMUSCULAR | Status: DC | PRN
  Administered 2016-03-31 (×2): 0.5 mg via INTRAVENOUS
  Filled 2016-03-31: qty 1

## 2016-03-31 MED ORDER — ENOXAPARIN SODIUM 40 MG/0.4ML ~~LOC~~ SOLN: 40.0000 mg | SUBCUTANEOUS | Status: DC

## 2016-03-31 MED ORDER — PHENYLEPHRINE HCL 10 MG/ML IJ SOLN
INTRAMUSCULAR | Status: DC | PRN
  Administered 2016-03-31: 80 ug via INTRAVENOUS

## 2016-03-31 MED ORDER — TRIAMTERENE-HCTZ 37.5-25 MG PO TABS
1.0000 | ORAL_TABLET | Freq: Every day | ORAL | Status: DC
  Administered 2016-04-01 – 2016-04-02 (×2): 1 via ORAL
  Filled 2016-03-31 (×2): qty 1

## 2016-03-31 MED ORDER — ROCURONIUM BROMIDE 50 MG/5ML IV SOLN
INTRAVENOUS | Status: AC
  Filled 2016-03-31: qty 1

## 2016-03-31 MED ORDER — SUCCINYLCHOLINE CHLORIDE 20 MG/ML IJ SOLN
INTRAMUSCULAR | Status: AC
  Filled 2016-03-31: qty 1

## 2016-03-31 MED ORDER — LIDOCAINE HCL (PF) 1 % IJ SOLN
INTRAMUSCULAR | Status: AC
  Filled 2016-03-31: qty 5

## 2016-03-31 MED ORDER — KETOROLAC TROMETHAMINE 30 MG/ML IJ SOLN
30.0000 mg | Freq: Four times a day (QID) | INTRAMUSCULAR | Status: DC
  Administered 2016-03-31 – 2016-04-02 (×8): 30 mg via INTRAVENOUS
  Filled 2016-03-31 (×8): qty 1

## 2016-03-31 MED ORDER — BUPIVACAINE-EPINEPHRINE (PF) 0.5% -1:200000 IJ SOLN
INTRAMUSCULAR | Status: AC
  Filled 2016-03-31: qty 30

## 2016-03-31 MED ORDER — KETOROLAC TROMETHAMINE 30 MG/ML IJ SOLN
INTRAMUSCULAR | Status: AC
  Filled 2016-03-31: qty 1

## 2016-03-31 MED ORDER — ONDANSETRON HCL 4 MG/2ML IJ SOLN
INTRAMUSCULAR | Status: AC
  Filled 2016-03-31: qty 2

## 2016-03-31 MED ORDER — EPHEDRINE SULFATE 50 MG/ML IJ SOLN
INTRAMUSCULAR | Status: DC | PRN
  Administered 2016-03-31: 10 mg via INTRAVENOUS

## 2016-03-31 MED ORDER — PAROXETINE HCL 20 MG PO TABS
40.0000 mg | ORAL_TABLET | ORAL | Status: DC
  Administered 2016-04-01 – 2016-04-02 (×2): 40 mg via ORAL
  Filled 2016-03-31 (×2): qty 2

## 2016-03-31 MED ORDER — MIDAZOLAM HCL 2 MG/2ML IJ SOLN
1.0000 mg | INTRAMUSCULAR | Status: DC | PRN
  Administered 2016-03-31: 2 mg via INTRAVENOUS

## 2016-03-31 MED ORDER — IBUPROFEN 600 MG PO TABS: 600.0000 mg | ORAL_TABLET | Freq: Four times a day (QID) | ORAL | Status: DC | PRN

## 2016-03-31 MED ORDER — TRIAMTERENE-HCTZ 37.5-25 MG PO CAPS
1.0000 | ORAL_CAPSULE | Freq: Every day | ORAL | Status: DC
  Filled 2016-03-31: qty 1

## 2016-03-31 MED ORDER — SUCCINYLCHOLINE CHLORIDE 20 MG/ML IJ SOLN
INTRAMUSCULAR | Status: DC | PRN
  Administered 2016-03-31: 140 mg via INTRAVENOUS

## 2016-03-31 SURGICAL SUPPLY — 52 items
APPLIER CLIP 13 LRG OPEN (CLIP)
BAG HAMPER (MISCELLANEOUS) ×2 IMPLANT
BENZOIN TINCTURE PRP APPL 2/3 (GAUZE/BANDAGES/DRESSINGS) ×2 IMPLANT
CELLS DAT CNTRL 66122 CELL SVR (MISCELLANEOUS) IMPLANT
CLIP APPLIE 13 LRG OPEN (CLIP) IMPLANT
CLOTH BEACON ORANGE TIMEOUT ST (SAFETY) ×2 IMPLANT
CONT SPEC 4OZ CLIKSEAL STRL BL (MISCELLANEOUS) ×2 IMPLANT
COVER LIGHT HANDLE STERIS (MISCELLANEOUS) ×4 IMPLANT
DRAPE WARM FLUID 44X44 (DRAPE) ×2 IMPLANT
DRESSING TELFA 8X3 (GAUZE/BANDAGES/DRESSINGS) ×2 IMPLANT
DRSG OPSITE POSTOP 4X8 (GAUZE/BANDAGES/DRESSINGS) ×2 IMPLANT
ELECT REM PT RETURN 9FT ADLT (ELECTROSURGICAL) ×2
ELECTRODE REM PT RTRN 9FT ADLT (ELECTROSURGICAL) ×1 IMPLANT
FORMALIN 10 PREFIL 480ML (MISCELLANEOUS) ×2 IMPLANT
GAUZE SPONGE 4X4 12PLY STRL (GAUZE/BANDAGES/DRESSINGS) ×2 IMPLANT
GLOVE BIOGEL PI IND STRL 7.0 (GLOVE) ×2 IMPLANT
GLOVE BIOGEL PI IND STRL 9 (GLOVE) ×1 IMPLANT
GLOVE BIOGEL PI INDICATOR 7.0 (GLOVE) ×2
GLOVE BIOGEL PI INDICATOR 9 (GLOVE) ×1
GLOVE ECLIPSE 6.5 STRL STRAW (GLOVE) ×8 IMPLANT
GLOVE ECLIPSE 9.0 STRL (GLOVE) ×4 IMPLANT
GOWN SPEC L3 XXLG W/TWL (GOWN DISPOSABLE) ×4 IMPLANT
GOWN STRL REUS W/TWL LRG LVL3 (GOWN DISPOSABLE) ×4 IMPLANT
INST SET MAJOR GENERAL (KITS) ×2 IMPLANT
KIT ROOM TURNOVER APOR (KITS) ×2 IMPLANT
MANIFOLD NEPTUNE II (INSTRUMENTS) ×2 IMPLANT
NEEDLE HYPO 25X1 1.5 SAFETY (NEEDLE) ×2 IMPLANT
NS IRRIG 1000ML POUR BTL (IV SOLUTION) ×6 IMPLANT
PACK ABDOMINAL MAJOR (CUSTOM PROCEDURE TRAY) ×2 IMPLANT
PAD ARMBOARD 7.5X6 YLW CONV (MISCELLANEOUS) ×2 IMPLANT
RETRACTOR WND ALEXIS 25 LRG (MISCELLANEOUS) IMPLANT
RTRCTR WOUND ALEXIS 18CM MED (MISCELLANEOUS)
RTRCTR WOUND ALEXIS 25CM LRG (MISCELLANEOUS)
SET BASIN LINEN APH (SET/KITS/TRAYS/PACK) ×2 IMPLANT
SPONGE LAP 18X18 X RAY DECT (DISPOSABLE) ×2 IMPLANT
STAPLER VISISTAT 35W (STAPLE) IMPLANT
STRIP CLOSURE SKIN 1/4X3 (GAUZE/BANDAGES/DRESSINGS) ×4 IMPLANT
SUT CHROMIC 0 CT 1 (SUTURE) ×26 IMPLANT
SUT CHROMIC 2 0 CT 1 (SUTURE) ×4 IMPLANT
SUT MON AB 3-0 SH 27 (SUTURE) IMPLANT
SUT PDS AB CT VIOLET #0 27IN (SUTURE) ×4 IMPLANT
SUT PLAIN CT 1/2CIR 2-0 27IN (SUTURE) ×2 IMPLANT
SUT VIC AB 0 CT1 27 (SUTURE)
SUT VIC AB 0 CT1 27XBRD ANTBC (SUTURE) IMPLANT
SUT VIC AB 0 CTX 36 (SUTURE)
SUT VIC AB 0 CTX36XBRD ANTBCTR (SUTURE) IMPLANT
SUT VICRYL 3 0 (SUTURE) ×2 IMPLANT
SYR CONTROL 10ML LL (SYRINGE) ×2 IMPLANT
TAPE CLOTH SURG 4X10 WHT LF (GAUZE/BANDAGES/DRESSINGS) ×2 IMPLANT
TAPE MEDIFIX FOAM 3 (GAUZE/BANDAGES/DRESSINGS) ×2 IMPLANT
TRAY FOLEY CATH SILVER 16FR (SET/KITS/TRAYS/PACK) ×2 IMPLANT
TROCAR XCEL NON-BLD 5MMX100MML (ENDOMECHANICALS) ×2 IMPLANT

## 2016-03-31 NOTE — H&P (Signed)
Susan Martin is a 55 y.o. female presenting today for f/u of CT A/P results from 03/09/16. She currently complains of persistent abdominal bloating and constipation. No h/o PE/DVT. She denies leg swelling.  Pt informed of my discussions of case with Dr Denman George this a.m., with proposed surgical planning discussed. Per CT:  1. Large complex cystic mass arising from the pelvis and occupying the near entirety of the peritoneal space of the pelvis and abdomen. 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 Mass is extremely large with a calculated volume of 10 liters. 2. Enhancing tissue in the LEFT adnexa measuring 2.5 by 3.9 by 4.6 cm. 3. No evidence of peritoneal metastasis. 4. Subpleural nodules at the lung bases are indeterminate. Recommend attention on follow-up.  ROS:  Review of Systems  Cardiovascular: Negative for leg swelling.  Gastrointestinal: Positive for constipation.       +persistent abdominal bloating   Pertinent History Reviewed:   Reviewed: Significant for endometrial ablation   Medical             Past Medical History:  Diagnosis Date  . Depression   . Hypertension                               Surgical Hx:         Past Surgical History:  Procedure Laterality Date  . ENDOMETRIAL ABLATION     Medications: Reviewed & Updated - see associated section                       Current Outpatient Prescriptions:  .  PARoxetine (PAXIL) 10 MG tablet, Take 10 mg by mouth daily., Disp: , Rfl:  .  triamterene-hydrochlorothiazide (DYAZIDE) 37.5-25 MG capsule, Take 1 capsule by mouth daily., Disp: , Rfl:  .  Plecanatide (TRULANCE) 3 MG TABS, Take 1 tablet by mouth daily., Disp: , Rfl:    Social History: Reviewed -  reports that she has quit smoking. She has never used smokeless tobacco.  Objective Findings:  Vitals: Blood pressure 130/82, pulse 80, height 5\' 3"  (1.6 m), weight 149 lb 9.6 oz  (67.9 kg).  Physical Examination: General appearance - alert, well appearing, and in no distress Mental status - alert, oriented to person, place, and time Heart- RRR Lungs- Lungs CTA bilaterally.  Breasts- Symmetric in size. No masses, skin changes, nipple drainage, or lymphadenopathy. Abdomen - soft, nontender,  Pelvic -  VULVA: normal appearing vulva with no masses, tenderness or lesions,  VAGINA: normal appearing vagina for age with normal color and discharge, no lesions,  CERVIX: normal appearing cervix without discharge or lesions, normal appearance, pap collected  UTERUS: acutely anteflexed, unable to assess d/t position  Musculoskeletal - no joint tenderness, deformity or swelling Extremities - peripheral pulses normal, no pedal edema, no clubbing or cyanosis Skin - normal coloration and turgor, no rashes, no suspicious skin lesions noted   CBC    Component Value Date/Time   WBC 7.1 03/27/2016 0959   RBC 4.52 03/27/2016 0959   HGB 13.9 03/27/2016 0959   HCT 41.1 03/27/2016 0959   HCT 43.4 03/05/2016 1201   PLT 297 03/27/2016 0959   PLT 340 03/05/2016 1201   MCV 90.9 03/27/2016 0959   MCV 90 03/05/2016 1201   MCH 30.8 03/27/2016 0959   MCHC 33.8 03/27/2016 0959   RDW 12.7 03/27/2016 0959  RDW 12.8 03/05/2016 1201   LYMPHSABS 1.0 03/05/2016 1201   EOSABS 0.0 03/05/2016 1201   BASOSABS 0.0 03/05/2016 1201   CMP Latest Ref Rng & Units 03/27/2016 03/05/2016  Glucose 65 - 99 mg/dL 103(H) 111(H)  BUN 6 - 20 mg/dL 10 10  Creatinine 0.44 - 1.00 mg/dL 0.73 0.76  Sodium 135 - 145 mmol/L 135 140  Potassium 3.5 - 5.1 mmol/L 4.1 4.6  Chloride 101 - 111 mmol/L 99(L) 97  CO2 22 - 32 mmol/L 29 24  Calcium 8.9 - 10.3 mg/dL 8.9 9.7  Total Protein 6.5 - 8.1 g/dL 7.3 7.4  Total Bilirubin 0.3 - 1.2 mg/dL 0.6 0.3  Alkaline Phos 38 - 126 U/L 66 76  AST 15 - 41 U/L 16 14  ALT 14 - 54 U/L 11(L) 8    Endometrial Biopsy: Patient given informed consent, signed copy in the chart, time  out was performed. Time out taken. The patient was placed in the lithotomy position and the cervix brought into view with sterile speculum.  Portio of cervix cleansed x 2 with betadine swabs.  A tenaculum was placed in the anterior lip of the cervix. The uterus was sounded for depth of 4 cm. Unable to complete the biopsy d/t stenotic endometrial cavity and uterus position.   Followup: as scheduled for surgery   _____________________________________________________________  Discussed with pt risks and benefits of cyst excision with or without unilateral or bilateral salping oophorectomy. Pt informed that pathology reports during surgical procedure will indicate which procedure is necessary. Pt was offered referral to have surgery with gynecologic oncologist, Dr. Denman George, and she declines this option At end of discussion, pt had opportunity to ask questions and has no further questions at this time. Pt reports she is comfortable proceeding with surgery at [facility] with myself.   Greater than 50% was spent in counseling and coordination of care with the patient. Total time greater than: 45 minutes   Assessment & Plan:   A:  1. Encounter for discussion of CT results, surgical options  2. 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 Pap smear collected today, endometrial biopsy unsuccessful  4. Case discussed with GYN gynecological oncologist, Dr. Denman George, who agrees with treatment plan.   P:  1. Schedule cyst excision with salping oophorectomy and possible hysterectomy, with frozen section 03/31/16 2. Advised pt to use OTC Miralax to alleviate constipation     By signing my name below, I, Hansel Feinstein, attest that this documentation has been prepared under the direction and in the presence of Jonnie Kind, MD. Electronically Signed: Hansel Feinstein, ED Scribe. 03/11/16. 8:55 AM.   I personally performed the services described in this documentation, which was SCRIBED in my presence. The  recorded information has been reviewed and considered accurate. It has been edited as necessary during review. Jonnie Kind, MD

## 2016-03-31 NOTE — Anesthesia Preprocedure Evaluation (Signed)
Anesthesia Evaluation  Patient identified by MRN, date of birth, ID band Patient awake    Reviewed: Allergy & Precautions, NPO status , Patient's Chart, lab work & pertinent test results  Airway Mallampati: I  TM Distance: >3 FB     Dental  (+) Teeth Intact, Implants, Dental Advisory Given   Pulmonary former smoker,    Pulmonary exam normal        Cardiovascular hypertension, Pt. on medications  Rhythm:Regular Rate:Normal     Neuro/Psych PSYCHIATRIC DISORDERS Depression    GI/Hepatic negative GI ROS,   Endo/Other    Renal/GU      Musculoskeletal   Abdominal   Peds  Hematology   Anesthesia Other Findings   Reproductive/Obstetrics                             Anesthesia Physical Anesthesia Plan  ASA: II  Anesthesia Plan: General   Post-op Pain Management:    Induction: Intravenous, Rapid sequence and Cricoid pressure planned  Airway Management Planned: Oral ETT  Additional Equipment:   Intra-op Plan:   Post-operative Plan: Extubation in OR  Informed Consent: I have reviewed the patients History and Physical, chart, labs and discussed the procedure including the risks, benefits and alternatives for the proposed anesthesia with the patient or authorized representative who has indicated his/her understanding and acceptance.     Plan Discussed with:   Anesthesia Plan Comments:         Anesthesia Quick Evaluation

## 2016-03-31 NOTE — Addendum Note (Signed)
Addendum  created 03/31/16 1204 by Alvy Bimler, CRNA   Sign clinical note

## 2016-03-31 NOTE — Op Note (Signed)
Please see the brief operative note for surgical details 

## 2016-03-31 NOTE — Anesthesia Postprocedure Evaluation (Signed)
Anesthesia Post Note  Patient: Susan Martin  Procedure(s) Performed: Procedure(s) (LRB): LEFT SALPINGO OOPHORECTOMY WITH FROZEN SECTION,  ABDOMINAL HYSTERECTOMY WITH BILATERAL SALPINGO-OOPHORECTOMY (Left)  Patient location during evaluation: PACU Anesthesia Type: General Level of consciousness: awake and alert Pain management: pain level controlled Vital Signs Assessment: post-procedure vital signs reviewed and stable Respiratory status: spontaneous breathing, nonlabored ventilation, respiratory function stable and patient connected to nasal cannula oxygen Cardiovascular status: blood pressure returned to baseline and stable Postop Assessment: no signs of nausea or vomiting Anesthetic complications: no    Last Vitals:  Vitals:   03/31/16 1132 03/31/16 1150  BP: (!) 148/77 (!) 149/73  Pulse: 98 81  Resp: 16 16  Temp:  36.4 C    Last Pain:  Vitals:   03/31/16 1154  TempSrc:   PainSc: 4                  Geran Haithcock,Carlon

## 2016-03-31 NOTE — Brief Op Note (Signed)
03/31/2016  10:30 AM  PATIENT:  Susan Martin  55 y.o. female  PRE-OPERATIVE DIAGNOSIS:  Large cystic ovarian mass   POST-OPERATIVE DIAGNOSIS:  Large cystic ovarian mass, mucinous cystadenoma left ovary PROCEDURE:  Procedure(s): LEFT SALPINGO OOPHORECTOMY WITH FROZEN SECTION,  ABDOMINAL HYSTERECTOMY WITH BILATERAL SALPINGO-OOPHORECTOMY (Left)  SURGEON:  Surgeon(s) and Role:    * Jonnie Kind, MD - Primary  PHYSICIAN ASSISTANT:   ASSISTANTS: Hinda Glatter RNFA   ANESTHESIA:   local and general  EBL:  Total I/O In: 2100 [I.V.:2100] Out: 600 [Urine:400; Blood:200]  BLOOD ADMINISTERED:none  DRAINS: Urinary Catheter (Foley)   LOCAL MEDICATIONS USED:  MARCAINE    and Amount: 9 ml  SPECIMEN:  Source of Specimen:  Uterus bilateral fallopian tubes and ovaries, frozen section on left ovary  DISPOSITION OF SPECIMEN:  PATHOLOGY  COUNTS:  YES  TOURNIQUET:  * No tourniquets in log *  DICTATION: .Dragon Dictation  PLAN OF CARE: Admit to inpatient   PATIENT DISPOSITION:  PACU - hemodynamically stable.   Delay start of Pharmacological VTE agent (>24hrs) due to surgical blood loss or risk of bleeding: not applicable  Indications large cystic mass felt to originate from left ovary of unknown duration, with minimally elevated CA-125, no ascites suspected is benign Details of procedure: Patient was taken operating room prepped and draped for abdominal procedure, Foley catheter in place vagina prepped. Antibodies were administered timeout was conducted. Midline vertical incision was made from symphysis pubis up and about 8 cm initially with sharp dissection down to the fascia and then into the peritoneal cavity which was opened. There was minimal peritoneal fluid which was collected future cytology the key smooth cystic mass could be identified and was decompressed with a IV millimeter trocar inserted into one of the cystic components of the mass and allowed to evacuate a huge amount of  cyst fluid. There was some spillage around the trocar to avoid this. The cyst was then quite firm pressure initially. Cyst fluid with clear yellow and easily absorbed. This huge cyst cyst was removed there were some smaller cysts in the back portion of the mass. The opening into the cyst itself was enlarged slightly in a couple of these other cystic loculations within opened which revealed a more mucinous material which was kept within the confines of the original intraovarian cyst pocket. Once decompression admission completed it was felt that the remaining 15 x 15 cm mass would not be able be removed through the current incisions so the incision was extended up approximately 12 cm in total length Toward the umbilicus. Some cyst adhesions to the area of the umbilicus were sharply dissected down. The cyst could then be  pulled  through the incision. It did originate from left tube and ovary. The ovarian ligament and infundibulopelvic ligaments could then be cross clamped close to the close to the ovary and removed. Specimen was sent for frozen section. Abdomen was inspected there was no adenopathy no ascites and no suspicious areas suggesting malignancy. Decision to proceed with hysterectomy was and we proceeded with standard hysterectomy take down the round ligaments bilaterally with suture ligature, and sharp dissection and development of bladder flap anteriorly. Right IP ligament could be identified with the ureter clearly identified well out of the surgical field and the IP ligament on the right clamped cut and suture ligated. On the left side the retroperitoneum was sufficiently to identify the ureter with the banjo string plucking technique and confirmed as well out of the surgical field.  The ligament on the left was again ligated, and March down the side of the uterus on each side using curved Heaney clamps to clamp the uterine vessels Kelly clamps for backbleeding, with transection the uterine vessels and  suture ligature of 0 chromic. Upper and lower cardinal ligaments were taken down with straight Heaney clamps knife dissection and 0 chromic suture ligature. Upon the recent level of the cervix, a stab incision was made into the anterior cervicovaginal fornix and the cervix circumscribed off completely removing Cintron to leave a remnant of the paracervical ring and supporting tissue. The cuff was then closed using Aldridge stitches at each lateral vaginal angle and then sewing the cuff closed with a series of interrupted sutures and figure-of-eight sutures. Pelvis was irrigated and inspected for hemostasis. Point cautery was used 2 on the cuff and otherwise hemostasis was satisfactory. His were inspected and again the ureters confirmed as well out of the surgical area abdomen was irrigated extensively. Laparotomy equipment was taken out and sponge and needle counts correct. Closed using 2-0 chromic followed by 0 PDS posterior of the fascia. subcutaneous tissues were then reapproximated using interrupted 20 plain and then subcuticular 4-0 Vicryl close the skin sponge and needle counts were correct pathology frozen section report from Dr. Bess Harvest indicated benign cystic structures consistent with mucinous cystadenoma

## 2016-03-31 NOTE — Anesthesia Procedure Notes (Signed)
Procedure Name: Intubation Date/Time: 03/31/2016 7:38 AM Performed by: Gilmer Mor R Pre-anesthesia Checklist: Patient identified, Emergency Drugs available, Suction available and Patient being monitored Patient Re-evaluated:Patient Re-evaluated prior to inductionOxygen Delivery Method: Circle system utilized Preoxygenation: Pre-oxygenation with 100% oxygen Intubation Type: IV induction, Cricoid Pressure applied and Rapid sequence Laryngoscope Size: Mac and 3 Grade View: Grade II Tube type: Oral Tube size: 7.0 mm Number of attempts: 1 Airway Equipment and Method: Stylet Placement Confirmation: ETT inserted through vocal cords under direct vision,  positive ETCO2 and breath sounds checked- equal and bilateral Secured at: 21 (right lip) cm Tube secured with: Tape Dental Injury: Teeth and Oropharynx as per pre-operative assessment

## 2016-03-31 NOTE — Anesthesia Postprocedure Evaluation (Signed)
Anesthesia Post Note  Patient: Susan Martin  Procedure(s) Performed: Procedure(s) (LRB): LEFT SALPINGO OOPHORECTOMY WITH FROZEN SECTION,  ABDOMINAL HYSTERECTOMY WITH BILATERAL SALPINGO-OOPHORECTOMY (Left)  Patient location during evaluation: Nursing Unit Anesthesia Type: General Level of consciousness: awake Pain management: satisfactory to patient Vital Signs Assessment: post-procedure vital signs reviewed and stable Respiratory status: spontaneous breathing Cardiovascular status: stable Anesthetic complications: no    Last Vitals:  Vitals:   03/31/16 1115 03/31/16 1132  BP: 138/77 (!) 148/77  Pulse: 87 98  Resp: 14 16  Temp:      Last Pain:  Vitals:   03/31/16 1115  TempSrc:   PainSc: Asleep                 Lovelee Forner

## 2016-03-31 NOTE — Transfer of Care (Signed)
Immediate Anesthesia Transfer of Care Note  Patient: Susan Martin  Procedure(s) Performed: Procedure(s): LEFT SALPINGO OOPHORECTOMY WITH FROZEN SECTION,  ABDOMINAL HYSTERECTOMY WITH BILATERAL SALPINGO-OOPHORECTOMY (Left)  Patient Location: PACU  Anesthesia Type:General  Level of Consciousness: awake, alert , oriented and patient cooperative  Airway & Oxygen Therapy: Patient Spontanous Breathing and Patient connected to face mask oxygen  Post-op Assessment: Report given to RN, Post -op Vital signs reviewed and stable and Patient moving all extremities X 4  Post vital signs: Reviewed and stable  Last Vitals:  Vitals:   03/31/16 0730 03/31/16 1008  BP: (!) 142/101 134/74  Pulse:  74  Resp: (!) 23 11  Temp:  36.6 C    Last Pain:  Vitals:   03/31/16 0645  TempSrc: Oral      Patients Stated Pain Goal: 8 ([id number])  Complications: No apparent anesthesia complications

## 2016-04-01 LAB — CBC
HCT: 32.8 % — ABNORMAL LOW (ref 36.0–46.0)
Hemoglobin: 11 g/dL — ABNORMAL LOW (ref 12.0–15.0)
MCH: 30.6 pg (ref 26.0–34.0)
MCHC: 33.5 g/dL (ref 30.0–36.0)
MCV: 91.4 fL (ref 78.0–100.0)
Platelets: 212 10*3/uL (ref 150–400)
RBC: 3.59 MIL/uL — ABNORMAL LOW (ref 3.87–5.11)
RDW: 12.8 % (ref 11.5–15.5)
WBC: 7.3 10*3/uL (ref 4.0–10.5)

## 2016-04-01 LAB — BASIC METABOLIC PANEL
Anion gap: 5 (ref 5–15)
BUN: 6 mg/dL (ref 6–20)
CALCIUM: 7.6 mg/dL — AB (ref 8.9–10.3)
CO2: 26 mmol/L (ref 22–32)
CREATININE: 0.58 mg/dL (ref 0.44–1.00)
Chloride: 105 mmol/L (ref 101–111)
GFR calc Af Amer: >60 mL/min (ref >60)
GLUCOSE: 80 mg/dL (ref 65–99)
Potassium: 3.5 mmol/L (ref 3.5–5.1)
Sodium: 136 mmol/L (ref 135–145)

## 2016-04-01 NOTE — Addendum Note (Signed)
Addendum  created 04/01/16 [id number] by Mickel Baas, CRNA   Sign clinical note

## 2016-04-01 NOTE — Progress Notes (Signed)
1 Day Post-Op Procedure(s) (LRB): LEFT SALPINGO OOPHORECTOMY WITH FROZEN SECTION,  ABDOMINAL HYSTERECTOMY WITH BILATERAL SALPINGO-OOPHORECTOMY (Left)  Subjective: Patient reports incisional pain and tolerating PO.  Incision pain is rated as a 3.  Objective: I have reviewed patient's vital signs, intake and output and medications.  General: alert, cooperative and no distress Resp: clear to auscultation bilaterally GI: soft, non-tender; bowel sounds normal; no masses,  no organomegaly and incision: clean, dry and intact CBC Latest Ref Rng & Units 04/01/2016 03/31/2016 03/27/2016  WBC 4.0 - 10.5 K/uL 7.3 8.8 7.1  Hemoglobin 12.0 - 15.0 g/dL 11.0(L) 12.9 13.9  Hematocrit 36.0 - 46.0 % 32.8(L) 38.4 41.1  Platelets 150 - 400 K/uL 212 262 297    Assessment: s/p Procedure(s): LEFT SALPINGO OOPHORECTOMY WITH FROZEN SECTION,  ABDOMINAL HYSTERECTOMY WITH BILATERAL SALPINGO-OOPHORECTOMY (Left): stable, progressing well and tolerating diet  Plan: Advance diet Encourage ambulation Advance to PO medication Will keep until tomorrow and discharge anticipated 04/02/2016  Will not start Lovenox as precaution for postoperative bleeding  LOS: 1 day    Susan Martin V 04/01/2016, 8:33 AM

## 2016-04-01 NOTE — Anesthesia Postprocedure Evaluation (Signed)
Anesthesia Post Note  Patient: Nzinga Greenspan  Procedure(s) Performed: Procedure(s) (LRB): LEFT SALPINGO OOPHORECTOMY WITH FROZEN SECTION,  ABDOMINAL HYSTERECTOMY WITH BILATERAL SALPINGO-OOPHORECTOMY (Left)  Patient location during evaluation: Nursing Unit Anesthesia Type: General Level of consciousness: awake and alert and oriented Pain management: pain level controlled Vital Signs Assessment: post-procedure vital signs reviewed and stable Respiratory status: spontaneous breathing Cardiovascular status: stable Postop Assessment: no signs of nausea or vomiting Anesthetic complications: no    Last Vitals:  Vitals:   04/01/16 0235 04/01/16 0630  BP: 112/60 114/63  Pulse: 73 67  Resp: 20 20  Temp: 36.7 C 37 C    Last Pain:  Vitals:   04/01/16 0630  TempSrc: Oral  PainSc:                  ADAMS, AMY A

## 2016-04-01 NOTE — Progress Notes (Addendum)
Note deleted as was placed on the wrong chart

## 2016-04-02 DIAGNOSIS — D251 Intramural leiomyoma of uterus: Secondary | ICD-10-CM

## 2016-04-02 DIAGNOSIS — N736 Female pelvic peritoneal adhesions (postinfective): Secondary | ICD-10-CM

## 2016-04-02 DIAGNOSIS — N838 Other noninflammatory disorders of ovary, fallopian tube and broad ligament: Secondary | ICD-10-CM

## 2016-04-02 DIAGNOSIS — D271 Benign neoplasm of left ovary: Principal | ICD-10-CM

## 2016-04-02 DIAGNOSIS — N888 Other specified noninflammatory disorders of cervix uteri: Secondary | ICD-10-CM

## 2016-04-02 DIAGNOSIS — N879 Dysplasia of cervix uteri, unspecified: Secondary | ICD-10-CM

## 2016-04-02 MED ORDER — IBUPROFEN 600 MG PO TABS: 600.0000 mg | ORAL_TABLET | Freq: Four times a day (QID) | ORAL | 0 refills | Status: DC | PRN

## 2016-04-02 MED ORDER — OXYCODONE-ACETAMINOPHEN 5-325 MG PO TABS: 1.0000 | ORAL_TABLET | ORAL | 0 refills | Status: DC | PRN

## 2016-04-02 NOTE — Discharge Summary (Signed)
Physician Discharge Summary  Patient ID: Susan Martin MRN: [id number] DOB/AGE: 02/26/1961 55 y.o.  Admit date: 03/31/2016 Discharge date: 04/02/2016  Admission Diagnoses:Left ovarian mass  Discharge Diagnoses: Mucinous cystadenoma left ovary Active Problems:   * No active hospital problems. *   Discharged Condition: good  Hospital Course: This 55 year old postmenopausal female was admitted for removal of large abdominal mass estimated at 25+ pounds, with normal laboratory evaluation, minimally elevated CA-125. Doing laparotomy on admission removing a large mucinous cystadenoma through midline vertical incision. The largest cyst was decompressed, the remainder of the cyst removed through the midline lower abdominal incision and abdominal hysterectomy and bilateral salpingo-oophorectomy performed. Pathology is returned showing a benign mucinous cystadenoma and otherwise benign tissues. Postoperative course has been uneventful and patient was stable for discharge on postop day 2, afebrile tolerating diet with normal return of bowel function, with passage of flatus  Consults: None  Significant Diagnostic Studies: labs:  CBC Latest Ref Rng & Units 04/01/2016 03/31/2016 03/27/2016  WBC 4.0 - 10.5 K/uL 7.3 8.8 7.1  Hemoglobin 12.0 - 15.0 g/dL 11.0(L) 12.9 13.9  Hematocrit 36.0 - 46.0 % 32.8(L) 38.4 41.1  Platelets 150 - 400 K/uL 212 262 297     Treatments: surgery: Abdominal hysterectomy bilateral salpingo-oophorectomy with frozen section confirmation of diagnosis of mucinous cystadenoma  Discharge Exam: Blood pressure (!) 153/70, pulse 67, temperature 97.5 F (36.4 C), temperature source Oral, resp. rate 20, height 5\' 3"  (1.6 m), weight 152 lb (68.9 kg), SpO2 98 %. General appearance: alert, cooperative and no distress Head: Normocephalic, without obvious abnormality, atraumatic Resp: clear to auscultation bilaterally GI: soft, non-tender; bowel sounds normal; no masses,  no organomegaly  and Incision clean dry and intact  Disposition: 01-Home or Self Care  Discharge Instructions    Call MD for:  persistant nausea and vomiting    Complete by:  As directed    Call MD for:  severe uncontrolled pain    Complete by:  As directed    Call MD for:  temperature >100.4    Complete by:  As directed    Diet - low sodium heart healthy    Complete by:  As directed    Increase activity slowly    Complete by:  As directed        Medication List    TAKE these medications   ibuprofen 600 MG tablet Commonly known as:  ADVIL,MOTRIN Take 1 tablet (600 mg total) by mouth every 6 (six) hours as needed (mild pain).   oxyCODONE-acetaminophen 5-325 MG tablet Commonly known as:  PERCOCET/ROXICET Take 1-2 tablets by mouth every 4 (four) hours as needed for severe pain (moderate to severe pain (when tolerating fluids)).   PARoxetine 40 MG tablet Commonly known as:  PAXIL Take 40 mg by mouth every morning.   polyethylene glycol packet Commonly known as:  MIRALAX / GLYCOLAX Take 17 g by mouth every other day.   triamterene-hydrochlorothiazide 37.5-25 MG capsule Commonly known as:  DYAZIDE Take 1 capsule by mouth daily.      Follow-up Information    Jonnie Kind, MD Follow up in 2 week(s).   Specialties:  Obstetrics and Gynecology, Radiology Contact information: [place] [address] [phone]           Signed: Jonnie Kind 04/02/2016, 10:07 AM

## 2016-04-02 NOTE — Progress Notes (Signed)
Pt's IV catheter removed and intact. Pt's IV site clean dry and intact. Discharge instructions including medications and follow up appointments were reviewed and discussed with patient. Pt verbalized understanding of discharge instructions including medications and follow up appointments. All questions were answered and no further questions at this time. Pt in stable condition and in no acute distress at time of discharge. Pt will be escorted by nurse tech.  

## 2016-04-02 NOTE — Discharge Instructions (Signed)
Abdominal Hysterectomy, Care After These instructions give you information on caring for yourself after your procedure. Your doctor may also give you more specific instructions. Call your doctor if you have any problems or questions after your procedure.  HOME CARE It takes 4-6 weeks to recover from this surgery. Follow all of your doctor's instructions.   Only take medicines as told by your doctor.  Change your bandage as told by your doctor.  Return to your doctor to have your stitches taken out.  Take showers for 2-3 weeks. Ask your doctor when it is okay to shower.  Do not douche, use tampons, or have sex (intercourse) for at least 6 weeks or as told.  Follow your doctor's advice about exercise, lifting objects, driving, and general activities.  Get plenty of rest and sleep.  Do not lift anything heavier than a gallon of milk (about 10 pounds [4.5 kilograms]) for the first month after surgery.  Get back to your normal diet as told by your doctor.  Do not drink alcohol until your doctor says it is okay.  Take a medicine to help you poop (laxative) as told by your doctor.  Eating foods high in fiber may help you poop. Eat a lot of raw fruits and vegetables, whole grains, and beans.  Drink enough fluids to keep your pee (urine) clear or pale yellow.  Have someone help you at home for 1-2 weeks after your surgery.  Keep follow-up doctor visits as told. GET HELP IF:  You have chills or fever.  You have puffiness, redness, or pain in area of the cut (incision).  You have yellowish-white fluid (pus) coming from the cut.  You have a bad smell coming from the cut or bandage.  Your cut pulls apart.  You feel dizzy or light-headed.  You have pain or bleeding when you pee.  You keep having watery poop (diarrhea).  You keep feeling sick to your stomach (nauseous) or keep throwing up (vomiting).  You have fluid (discharge) coming from your vagina.  You have a  rash.  You have a reaction to your medicine.  You need stronger pain medicine. GET HELP RIGHT AWAY IF:   You have a fever and your symptoms suddenly get worse.  You have bad belly (abdominal) pain.  You have chest pain.  You are short of breath.  You pass out (faint).  You have pain, puffiness, or redness of your leg.  You bleed a lot from your vagina and notice clumps of tissue (clots). MAKE SURE YOU:   Understand these instructions.  Will watch your condition.  Will get help right away if you are not doing well or get worse.   This information is not intended to replace advice given to you by your health care provider. Make sure you discuss any questions you have with your health care provider.   Document Released: 03/24/2008 Document Revised: 06/20/2013 Document Reviewed: 04/07/2013 Elsevier Interactive Patient Education [employer].

## 2016-04-02 NOTE — Care Management Note (Signed)
Case Management Note  Patient Details  Name: Rubi Ooten MRN: [id number] Date of Birth: 1961/01/27  Subjective/Objective:                  Pt admitted s/p hysterectomy. Chart reviewed for DC needs. She is from home, lives with family and is ind with ADL's. She has no insurance and [facility] is aware. She has f/u provider and no medication needs.   Action/Plan: No CM needs anticipated. Pt anticipates DC today.   Expected Discharge Date:  04/02/16               Expected Discharge Plan:  Home/Self Care  In-House Referral:  Financial Counselor  Discharge planning Services  CM Consult  Post Acute Care Choice:  NA Choice offered to:  NA  DME Arranged:    DME Agency:     HH Arranged:    HH Agency:     Status of Service:  Completed, signed off  If discussed at [employer] of Stay Meetings, dates discussed:    Additional Comments:  Sherald Barge, RN 04/02/2016, 9:26 AM

## 2016-04-03 ENCOUNTER — Encounter (HOSPITAL_COMMUNITY): Payer: Self-pay | Admitting: Obstetrics and Gynecology

## 2016-04-13 ENCOUNTER — Encounter: Payer: Self-pay | Admitting: Obstetrics and Gynecology

## 2016-04-16 ENCOUNTER — Ambulatory Visit (INDEPENDENT_AMBULATORY_CARE_PROVIDER_SITE_OTHER): Payer: Self-pay | Admitting: Obstetrics and Gynecology

## 2016-04-16 ENCOUNTER — Encounter: Payer: Self-pay | Admitting: Obstetrics and Gynecology

## 2016-04-16 VITALS — BP 160/82 | HR 68 | Ht 63.0 in | Wt 118.8 lb

## 2016-04-16 DIAGNOSIS — Z9889 Other specified postprocedural states: Secondary | ICD-10-CM

## 2016-04-16 DIAGNOSIS — Z09 Encounter for follow-up examination after completed treatment for conditions other than malignant neoplasm: Secondary | ICD-10-CM

## 2016-04-16 DIAGNOSIS — D271 Benign neoplasm of left ovary: Secondary | ICD-10-CM

## 2016-04-16 NOTE — Progress Notes (Signed)
  Subjective:  Susan Martin is a 55 y.o. 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 Pt has not tried any medications for the relief of her symptoms. Pt denies any other symptoms.   Review of Systems Negative except    Diet:    Bowel movements : normal.  The patient is not having any pain.  Objective:  BP (!) 160/82   Pulse 68   Ht 5\' 3"  (1.6 m)   Wt 118 lb 12.8 oz (53.9 kg)   BMI 21.04 kg/m  General:Well developed, well nourished.  No acute distress. Abdomen: Bowel sounds normal, soft, non-tender.  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   Plan:  1.Wound care discussed   2. Rx estradiol tablets. 3. 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 return to work: not applicable. 5. Follow up in 4 weeks.  By signing my name below, I, Soijett Blue, attest that this documentation has been prepared under the direction and in the presence of Jonnie Kind, MD. Electronically Signed: Soijett Blue, ED Scribe. 04/16/16. 10:24 AM.  I personally performed the services described in this documentation, which was SCRIBED in my presence. The recorded information has been reviewed and considered accurate. It has been edited as necessary during review. Jonnie Kind, MD

## 2016-04-17 ENCOUNTER — Telehealth: Payer: Self-pay | Admitting: Obstetrics and Gynecology

## 2016-04-17 NOTE — Telephone Encounter (Signed)
Pt states saw Dr.Ferguson yesterday and thought Dr.Ferguson was going to prescribe HRT. Please advise.

## 2016-05-14 ENCOUNTER — Ambulatory Visit (INDEPENDENT_AMBULATORY_CARE_PROVIDER_SITE_OTHER): Payer: Self-pay | Admitting: Obstetrics and Gynecology

## 2016-05-14 ENCOUNTER — Encounter: Payer: Self-pay | Admitting: Obstetrics and Gynecology

## 2016-05-14 VITALS — BP 148/80 | HR 83 | Ht 63.0 in | Wt 125.4 lb

## 2016-05-14 DIAGNOSIS — D271 Benign neoplasm of left ovary: Secondary | ICD-10-CM

## 2016-05-14 DIAGNOSIS — Z09 Encounter for follow-up examination after completed treatment for conditions other than malignant neoplasm: Secondary | ICD-10-CM

## 2016-05-14 DIAGNOSIS — N951 Menopausal and female climacteric states: Secondary | ICD-10-CM

## 2016-05-14 DIAGNOSIS — Z9889 Other specified postprocedural states: Secondary | ICD-10-CM

## 2016-05-14 MED ORDER — ESTRADIOL 1 MG PO TABS: 1.0000 mg | ORAL_TABLET | Freq: Every day | ORAL | 11 refills | Status: DC

## 2016-05-14 NOTE — Patient Instructions (Signed)
Menopause Menopause is the normal time of life when menstrual periods stop completely. Menopause is complete when you have missed 12 consecutive menstrual periods. It usually occurs between the ages of 48 years and 55 years. Very rarely does a woman develop menopause before the age of 40 years. At menopause, your ovaries stop producing the female hormones estrogen and progesterone. This can cause undesirable symptoms and also affect your health. Sometimes the symptoms may occur 4-5 years before the menopause begins. There is no relationship between menopause and:  Oral contraceptives.  Number of children you had.  Race.  The age your menstrual periods started (menarche).  Heavy smokers and very thin women may develop menopause earlier in life. What are the causes?  The ovaries stop producing the female hormones estrogen and progesterone. Other causes include:  Surgery to remove both ovaries.  The ovaries stop functioning for no known reason.  Tumors of the pituitary gland in the brain.  Medical disease that affects the ovaries and hormone production.  Radiation treatment to the abdomen or pelvis.  Chemotherapy that affects the ovaries.  What are the signs or symptoms?  Hot flashes.  Night sweats.  Decrease in sex drive.  Vaginal dryness and thinning of the vagina causing painful intercourse.  Dryness of the skin and developing wrinkles.  Headaches.  Tiredness.  Irritability.  Memory problems.  Weight gain.  Bladder infections.  Hair growth of the face and chest.  Infertility. More serious symptoms include:  Loss of bone (osteoporosis) causing breaks (fractures).  Depression.  Hardening and narrowing of the arteries (atherosclerosis) causing heart attacks and strokes.  How is this diagnosed?  When the menstrual periods have stopped for 12 straight months.  Physical exam.  Hormone studies of the blood. How is this treated? There are many treatment  choices and nearly as many questions about them. The decisions to treat or not to treat menopausal changes is an individual choice made with your health care provider. Your health care provider can discuss the treatments with you. Together, you can decide which treatment will work best for you. Your treatment choices may include:  Hormone therapy (estrogen and progesterone).  Non-hormonal medicines.  Treating the individual symptoms with medicine (for example antidepressants for depression).  Herbal medicines that may help specific symptoms.  Counseling by a psychiatrist or psychologist.  Group therapy.  Lifestyle changes including: ? Eating healthy. ? Regular exercise. ? Limiting caffeine and alcohol. ? Stress management and meditation.  No treatment.  Follow these instructions at home:  Take the medicine your health care provider gives you as directed.  Get plenty of sleep and rest.  Exercise regularly.  Eat a diet that contains calcium (good for the bones) and soy products (acts like estrogen hormone).  Avoid alcoholic beverages.  Do not smoke.  If you have hot flashes, dress in layers.  Take supplements, calcium, and vitamin D to strengthen bones.  You can use over-the-counter lubricants or moisturizers for vaginal dryness.  Group therapy is sometimes very helpful.  Acupuncture may be helpful in some cases. Contact a health care provider if:  You are not sure you are in menopause.  You are having menopausal symptoms and need advice and treatment.  You are still having menstrual periods after age 55 years.  You have pain with intercourse.  Menopause is complete (no menstrual period for 12 months) and you develop vaginal bleeding.  You need a referral to a specialist (gynecologist, psychiatrist, or psychologist) for treatment. Get help right   away if:  You have severe depression.  You have excessive vaginal bleeding.  You fell and think you have a  broken bone.  You have pain when you urinate.  You develop leg or chest pain.  You have a fast pounding heart beat (palpitations).  You have severe headaches.  You develop vision problems.  You feel a lump in your breast.  You have abdominal pain or severe indigestion. This information is not intended to replace advice given to you by your health care provider. Make sure you discuss any questions you have with your health care provider. Document Released: 09/05/2003 Document Revised: 11/21/2015 Document Reviewed: 01/12/2013 Elsevier Interactive Patient Education  2017 Elsevier Inc.  

## 2016-05-14 NOTE — Progress Notes (Addendum)
Patient ID: Susan Martin, female   DOB: 04/28/1961, 55 y.o.   MRN: [id number]  Subjective:  Susan Martin is a 55 y.o. female now 6 weeks 2 days status post left salpingooophorectomy with frozen section, abdominal hysterectomy with bilateral salpingooophorectomy. Pt notes that she hasn't tried any medications for the relief of her symptoms. Pt denies any other symptoms.  Review of Systems Negative except   Diet:     Bowel movements : normal.  The patient is not having any pain.  + hot sweats much worse since surgery. No sex interest   Objective:  BP (!) 148/80    Pulse 83    Ht 5\' 3"  (1.6 m)    Wt 125 lb 6.4 oz (56.9 kg)    BMI 22.21 kg/m  General:Well developed, well nourished.  No acute distress. Abdomen: Bowel sounds normal, soft, non-tender. Pelvic Exam:    External Genitalia:  Normal.    Vagina: Normal, stiffness on left, well healed across the top, and good support noted.    Cervix: Normal    Uterus: surgically absent  Cuff : good support, some increased stiffness at left corner of vaginal cuff.    Adnexa/Bimanual: Normal  Incision(s):   Healing well, no drainage, no erythema, no hernia, no swelling, no dehiscence,     Assessment:  Post-Op 6 weeks 2 days s/p left salpingooophorectomy with frozen section, abdominal hysterectomy with bilateral salpingooophorectomy   Doing well postoperatively.   Plan:  1.Wound care discussed   2. Current medications. Estradiol Rx 3. Activity restrictions: none 4. return to work: not applicable. 5. Follow up in 1 year or PRN. reasses 3 months if has questions once she's been on HT x 3 months and resumed sexual activity.  By signing my name below, I, Soijett Blue, attest that this documentation has been prepared under the direction and in the presence of Jonnie Kind, MD. Electronically Signed: Soijett Blue, ED Scribe. 05/14/16. 9:03 AM.  I personally performed the services described in this documentation, which was SCRIBED in my  presence. The recorded information has been reviewed and considered accurate. It has been edited as necessary during review. Jonnie Kind, MD

## 2016-05-14 NOTE — Progress Notes (Signed)
Patient ID: Susan Martin, female   DOB: [birth year], 55 y.o.   MRN: [id number]  Subjective:  Susan Martin is a 55 y.o. female now 6 weeks 2 days status post left salpingooophorectomy with frozen section, abdominal hysterectomy with bilateral salpingooophorectomy. Pt notes that she hasn't tried any medications for the relief of her symptoms. Pt denies any other symptoms.  Review of Systems Negative except   Diet:     Bowel movements : normal.  The patient is not having any pain.  + hot sweats much worse since surgery. No sex interest   Objective:  BP (!) 148/80   Pulse 83   Ht 5\' 3"  (1.6 m)   Wt 125 lb 6.4 oz (56.9 kg)   BMI 22.21 kg/m  General:Well developed, well nourished.  No acute distress. Abdomen: Bowel sounds normal, soft, non-tender. Pelvic Exam:    External Genitalia:  Normal.    Vagina: Normal, stiffness on left, well healed across the top, and good support noted.    Cervix: Normal    Uterus: surgically absent  Cuff : good support, some increased stiffness at left corner of vaginal cuff.    Adnexa/Bimanual: Normal  Incision(s):   Healing well, no drainage, no erythema, no hernia, no swelling, no dehiscence,     Assessment:  Post-Op 6 weeks 2 days s/p left salpingooophorectomy with frozen section, abdominal hysterectomy with bilateral salpingooophorectomy   Doing well postoperatively.   Plan:  1.Wound care discussed   2. Current medications. Estradiol Rx 3. Activity restrictions: none 4. return to work: not applicable. 5. Follow up in 1 year or PRN. reasses 3 months if has questions once she's been on HT x 3 months and resumed sexual activity.  By signing my name below, I, Soijett Blue, attest that this documentation has been prepared under the direction and in the presence of Jonnie Kind, MD. Electronically Signed: Soijett Blue, ED Scribe. 05/14/16. 9:03 AM.  I personally performed the services described in this documentation, which was SCRIBED in my  presence. The recorded information has been reviewed and considered accurate. It has been edited as necessary during review. Jonnie Kind, MD

## 2016-05-28 ENCOUNTER — Encounter (HOSPITAL_COMMUNITY): Payer: Self-pay | Admitting: Obstetrics and Gynecology

## 2017-05-06 ENCOUNTER — Other Ambulatory Visit: Payer: Self-pay | Admitting: Obstetrics and Gynecology

## 2017-05-11 ENCOUNTER — Other Ambulatory Visit: Payer: Self-pay | Admitting: *Deleted

## 2017-05-12 ENCOUNTER — Other Ambulatory Visit: Payer: Self-pay | Admitting: *Deleted

## 2017-05-13 ENCOUNTER — Other Ambulatory Visit: Payer: Self-pay | Admitting: Obstetrics and Gynecology

## 2017-05-14 ENCOUNTER — Other Ambulatory Visit: Payer: Self-pay | Admitting: Obstetrics and Gynecology

## 2017-05-14 ENCOUNTER — Other Ambulatory Visit: Payer: Self-pay | Admitting: *Deleted

## 2017-05-14 MED ORDER — ESTRADIOL 1 MG PO TABS: 1.0000 mg | ORAL_TABLET | Freq: Every day | ORAL | 11 refills | Status: DC

## 2017-05-14 NOTE — Telephone Encounter (Signed)
Estradiol called in for 1 yr.

## 2017-05-25 NOTE — Telephone Encounter (Signed)
Request delayed in reaching inbasket. Taken care of thru other contacts with pt. 

## 2018-05-18 ENCOUNTER — Other Ambulatory Visit: Payer: Self-pay | Admitting: Obstetrics and Gynecology

## 2018-05-19 ENCOUNTER — Other Ambulatory Visit: Payer: Self-pay | Admitting: Obstetrics and Gynecology

## 2019-10-31 ENCOUNTER — Telehealth: Payer: Self-pay | Admitting: Obstetrics and Gynecology

## 2019-10-31 NOTE — Telephone Encounter (Signed)

## 2019-11-01 ENCOUNTER — Encounter: Payer: Self-pay | Admitting: Obstetrics and Gynecology

## 2019-11-01 ENCOUNTER — Ambulatory Visit: Payer: Self-pay | Admitting: Obstetrics and Gynecology

## 2019-11-01 ENCOUNTER — Other Ambulatory Visit: Payer: Self-pay

## 2019-11-01 VITALS — BP 171/80 | HR 74 | Ht 62.0 in | Wt 136.4 lb

## 2019-11-01 DIAGNOSIS — I10 Essential (primary) hypertension: Secondary | ICD-10-CM

## 2019-11-01 DIAGNOSIS — Z0000 Encounter for general adult medical examination without abnormal findings: Secondary | ICD-10-CM

## 2019-11-01 MED ORDER — PAROXETINE HCL 40 MG PO TABS: 40.0000 mg | ORAL_TABLET | ORAL | 3 refills | Status: DC

## 2019-11-01 MED ORDER — TRIAMTERENE-HCTZ 37.5-25 MG PO CAPS: 1.0000 | ORAL_CAPSULE | Freq: Every day | ORAL | 11 refills | Status: DC

## 2019-11-01 NOTE — Progress Notes (Signed)
PATIENT ID: Susan Martin, female     DOB: 03-Jan-1961, 59 y.o.     MRN: [id number]    [facility] Visit  11/01/19        Patient name: Susan Martin MRN [id number]  Date of birth: [birth year]  CC & HPI:  Susan Martin is a 59 y.o. female presenting today for medication management.  She was trying to come off of her paxol. She was off for about 5 months, but she felt so drained without it. She has also not been on her blood pressure medication in some time. Blood pressure today is 171/80. Patient notes that this is what her blood pressure has been running at home.   She does not have health insurance and is paying for blood work out of pocket.   ROS:  Review of Systems  Constitutional: Negative for diaphoresis, fever, malaise/fatigue and weight loss.  HENT: Negative for congestion and sore throat.   Eyes: Negative for blurred vision and double vision.  Respiratory: Negative for cough and shortness of breath.   Cardiovascular: Negative for chest pain, palpitations and leg swelling.  Gastrointestinal: Negative for constipation, diarrhea, nausea and vomiting.  Genitourinary: Negative for frequency and urgency.  Musculoskeletal: Negative for back pain, falls and myalgias.  Skin: Negative for rash.  Neurological: Negative for dizziness, weakness and headaches.  Psychiatric/Behavioral: Negative for depression. The patient is not nervous/anxious.     Pertinent History Reviewed:   Reviewed: Significant for off all bp meds x months, has BP cuff. Medical         Past Medical History:  Diagnosis Date  . Anemia   . Depression   . Hypertension                               Surgical Hx:    Past Surgical History:  Procedure Laterality Date  . ABDOMINAL HYSTERECTOMY    . ENDOMETRIAL ABLATION    . SALPINGOOPHORECTOMY Left 03/31/2016   Procedure: LEFT SALPINGO OOPHORECTOMY WITH FROZEN SECTION,  ABDOMINAL HYSTERECTOMY WITH BILATERAL SALPINGO-OOPHORECTOMY;  Surgeon: Jonnie Kind, MD;   Location: [facility];  Service: Gynecology;  Laterality: Left;   Medications: Reviewed & Updated - see associated section                       Current Outpatient Medications:  .  PARoxetine (PAXIL) 40 MG tablet, Take 40 mg by mouth every morning., Disp: , Rfl:  .  triamterene-hydrochlorothiazide (DYAZIDE) 37.5-25 MG capsule, Take 1 capsule by mouth daily., Disp: , Rfl:  .  estradiol (ESTRACE) 1 MG tablet, Take 1 tablet (1 mg total) daily by mouth., Disp: 30 tablet, Rfl: 11 .  ibuprofen (ADVIL,MOTRIN) 600 MG tablet, Take 1 tablet (600 mg total) by mouth every 6 (six) hours as needed (mild pain). (Patient not taking: Reported on 11/01/2019), Disp: 30 tablet, Rfl: 0 .  oxyCODONE-acetaminophen (PERCOCET/ROXICET) 5-325 MG tablet, Take 1-2 tablets by mouth every 4 (four) hours as needed for severe pain (moderate to severe pain (when tolerating fluids)). (Patient not taking: Reported on 05/14/2016), Disp: 30 tablet, Rfl: 0 .  polyethylene glycol (MIRALAX / GLYCOLAX) packet, Take 17 g by mouth every other day., Disp: , Rfl:    Social History: Reviewed -  reports that she quit smoking about 4 years ago. Her smoking use included cigarettes. She has a 2.50 pack-year smoking history. She has never used smokeless tobacco.  Objective Findings:  Vitals: Blood pressure (!) 171/80, pulse 74, height 5\' 2"  (1.575 m), weight 136 lb 6.4 oz (61.9 kg).  PHYSICAL EXAMINATION General appearance - alert, well appearing, and in no distress, oriented to person, place, and time and normal appearing weight Mental status - alert, oriented to person, place, and time, normal mood, behavior, speech, dress, motor activity, and thought processes, affect appropriate to mood Chest - not examined Heart - not examined Abdomen - not examined Breasts - not examined Skin - normal coloration and turgor, no rashes, no suspicious skin lesions noted   Assessment & Plan:   A:  1.  uncontrolled HTN may need 2 meds 2. paxil refill.  P:   1.  renew diuretic,  May need a seondmedication for htn ordered. 2. Refill paxil 40 mg  By signing my name below, I, [employer], attest that this documentation has been prepared under the direction and in the presence of Jonnie Kind, MD. Electronically Signed: Cerulean. 11/01/19. 11:17 AM.  I personally performed the services described in this documentation, which was SCRIBED in my presence. The recorded information has been reviewed and considered accurate. It has been edited as necessary during review. Jonnie Kind, MD

## 2020-04-28 ENCOUNTER — Other Ambulatory Visit: Payer: Self-pay

## 2020-04-28 ENCOUNTER — Inpatient Hospital Stay (HOSPITAL_COMMUNITY)
Admission: EM | Admit: 2020-04-28 | Discharge: 2020-05-08 | DRG: 329 | Disposition: A | Discharge status: Home Health | Payer: Medicaid Other | Attending: Surgery | Admitting: Surgery

## 2020-04-28 ENCOUNTER — Emergency Department (HOSPITAL_COMMUNITY): Payer: Medicaid Other

## 2020-04-28 DIAGNOSIS — Z8249 Family history of ischemic heart disease and other diseases of the circulatory system: Secondary | ICD-10-CM

## 2020-04-28 DIAGNOSIS — Z79899 Other long term (current) drug therapy: Secondary | ICD-10-CM

## 2020-04-28 DIAGNOSIS — F419 Anxiety disorder, unspecified: Secondary | ICD-10-CM | POA: Diagnosis present

## 2020-04-28 DIAGNOSIS — K5720 Diverticulitis of large intestine with perforation and abscess without bleeding: Secondary | ICD-10-CM | POA: Diagnosis present

## 2020-04-28 DIAGNOSIS — D630 Anemia in neoplastic disease: Secondary | ICD-10-CM | POA: Diagnosis present

## 2020-04-28 DIAGNOSIS — C187 Malignant neoplasm of sigmoid colon: Principal | ICD-10-CM

## 2020-04-28 DIAGNOSIS — Z87891 Personal history of nicotine dependence: Secondary | ICD-10-CM

## 2020-04-28 DIAGNOSIS — C787 Secondary malignant neoplasm of liver and intrahepatic bile duct: Secondary | ICD-10-CM

## 2020-04-28 DIAGNOSIS — E860 Dehydration: Secondary | ICD-10-CM | POA: Diagnosis present

## 2020-04-28 DIAGNOSIS — Z90710 Acquired absence of both cervix and uterus: Secondary | ICD-10-CM

## 2020-04-28 DIAGNOSIS — K567 Ileus, unspecified: Secondary | ICD-10-CM | POA: Diagnosis present

## 2020-04-28 DIAGNOSIS — I10 Essential (primary) hypertension: Secondary | ICD-10-CM | POA: Diagnosis present

## 2020-04-28 DIAGNOSIS — D75839 Thrombocytosis, unspecified: Secondary | ICD-10-CM | POA: Diagnosis not present

## 2020-04-28 DIAGNOSIS — Z9889 Other specified postprocedural states: Secondary | ICD-10-CM

## 2020-04-28 DIAGNOSIS — K631 Perforation of intestine (nontraumatic): Secondary | ICD-10-CM

## 2020-04-28 DIAGNOSIS — Z20822 Contact with and (suspected) exposure to covid-19: Secondary | ICD-10-CM | POA: Diagnosis present

## 2020-04-28 DIAGNOSIS — D509 Iron deficiency anemia, unspecified: Secondary | ICD-10-CM | POA: Diagnosis present

## 2020-04-28 DIAGNOSIS — F32A Depression, unspecified: Secondary | ICD-10-CM | POA: Diagnosis present

## 2020-04-28 DIAGNOSIS — K658 Other peritonitis: Secondary | ICD-10-CM | POA: Diagnosis present

## 2020-04-28 LAB — CBC WITH DIFFERENTIAL/PLATELET
Abs Immature Granulocytes: 0.06 10*3/uL (ref 0.00–0.07)
Basophils Absolute: 0 10*3/uL (ref 0.0–0.1)
Basophils Relative: 0 %
Eosinophils Absolute: 0 10*3/uL (ref 0.0–0.5)
Eosinophils Relative: 0 %
HCT: 27.3 % — ABNORMAL LOW (ref 36.0–46.0)
Hemoglobin: 7.8 g/dL — ABNORMAL LOW (ref 12.0–15.0)
Immature Granulocytes: 1 %
Lymphocytes Relative: 2 %
Lymphs Abs: 0.3 10*3/uL — ABNORMAL LOW (ref 0.7–4.0)
MCH: 18.8 pg — ABNORMAL LOW (ref 26.0–34.0)
MCHC: 28.6 g/dL — ABNORMAL LOW (ref 30.0–36.0)
MCV: 65.9 fL — ABNORMAL LOW (ref 80.0–100.0)
Monocytes Absolute: 0.3 10*3/uL (ref 0.1–1.0)
Monocytes Relative: 3 %
Neutro Abs: 12.6 10*3/uL — ABNORMAL HIGH (ref 1.7–7.7)
Neutrophils Relative %: 94 %
Platelets: 609 10*3/uL — ABNORMAL HIGH (ref 150–400)
RBC: 4.14 MIL/uL (ref 3.87–5.11)
RDW: 16.8 % — ABNORMAL HIGH (ref 11.5–15.5)
WBC: 13.2 10*3/uL — ABNORMAL HIGH (ref 4.0–10.5)
nRBC: 0 % (ref 0.0–0.2)

## 2020-04-28 LAB — COMPREHENSIVE METABOLIC PANEL
ALT: 13 U/L (ref 0–44)
AST: 24 U/L (ref 15–41)
Albumin: 3 g/dL — ABNORMAL LOW (ref 3.5–5.0)
Alkaline Phosphatase: 102 U/L (ref 38–126)
Anion gap: 15 (ref 5–15)
BUN: 19 mg/dL (ref 6–20)
CO2: 22 mmol/L (ref 22–32)
Calcium: 8.9 mg/dL (ref 8.9–10.3)
Chloride: 93 mmol/L — ABNORMAL LOW (ref 98–111)
Creatinine, Ser: 0.94 mg/dL (ref 0.44–1.00)
GFR, Estimated: >60 mL/min (ref >60)
Glucose, Bld: 137 mg/dL — ABNORMAL HIGH (ref 70–99)
Potassium: 2.9 mmol/L — ABNORMAL LOW (ref 3.5–5.1)
Sodium: 130 mmol/L — ABNORMAL LOW (ref 135–145)
Total Bilirubin: 0.9 mg/dL (ref 0.3–1.2)
Total Protein: 7.6 g/dL (ref 6.5–8.1)

## 2020-04-28 LAB — RESPIRATORY PANEL BY RT PCR (FLU A&B, COVID)
Influenza A by PCR: NEGATIVE
Influenza B by PCR: NEGATIVE
SARS Coronavirus 2 by RT PCR: NEGATIVE

## 2020-04-28 LAB — LIPASE, BLOOD: Lipase: 13 U/L (ref 11–51)

## 2020-04-28 LAB — MAGNESIUM: Magnesium: 1.8 mg/dL (ref 1.7–2.4)

## 2020-04-28 LAB — PROTIME-INR
INR: 1.1 (ref 0.8–1.2)
Prothrombin Time: 13.8 seconds (ref 11.4–15.2)

## 2020-04-28 MED ORDER — SODIUM CHLORIDE 0.9 % IV SOLN
10.0000 mL/h | Freq: Once | INTRAVENOUS | Status: AC
  Administered 2020-04-29: 10 mL/h via INTRAVENOUS

## 2020-04-28 MED ORDER — LACTATED RINGERS IV SOLN: INTRAVENOUS | Status: DC

## 2020-04-28 MED ORDER — POTASSIUM CHLORIDE 10 MEQ/100ML IV SOLN
10.0000 meq | INTRAVENOUS | Status: AC
  Administered 2020-04-28 (×3): 10 meq via INTRAVENOUS
  Filled 2020-04-28 (×2): qty 100

## 2020-04-28 MED ORDER — KETOROLAC TROMETHAMINE 30 MG/ML IJ SOLN
15.0000 mg | Freq: Once | INTRAMUSCULAR | Status: AC
  Administered 2020-04-28: 15 mg via INTRAVENOUS
  Filled 2020-04-28: qty 1

## 2020-04-28 MED ORDER — SODIUM CHLORIDE 0.9 % IV BOLUS
1000.0000 mL | Freq: Once | INTRAVENOUS | Status: AC
  Administered 2020-04-28: 1000 mL via INTRAVENOUS

## 2020-04-28 MED ORDER — PIPERACILLIN-TAZOBACTAM 3.375 G IVPB
3.3750 g | Freq: Three times a day (TID) | INTRAVENOUS | Status: DC
  Administered 2020-04-28 – 2020-05-04 (×17): 3.375 g via INTRAVENOUS
  Filled 2020-04-28 (×17): qty 50

## 2020-04-28 MED ORDER — IOHEXOL 300 MG/ML  SOLN
100.0000 mL | Freq: Once | INTRAMUSCULAR | Status: AC | PRN
  Administered 2020-04-28: 80 mL via INTRAVENOUS

## 2020-04-28 MED ORDER — MORPHINE SULFATE (PF) 4 MG/ML IV SOLN
4.0000 mg | Freq: Once | INTRAVENOUS | Status: AC
  Administered 2020-04-28: 4 mg via INTRAVENOUS
  Filled 2020-04-28: qty 1

## 2020-04-28 MED ORDER — HYDROMORPHONE HCL 1 MG/ML IJ SOLN: 1.0000 mg | Freq: Once | INTRAMUSCULAR | Status: DC

## 2020-04-28 NOTE — ED Notes (Signed)
Pt verbalized she did not have covid vaccine or flu vaccine.

## 2020-04-28 NOTE — ED Provider Notes (Signed)
[facility]ERGENCY DEPARTMENT Provider Note   CSN: [id number] Arrival date & time: 04/28/20  1846     History Chief Complaint  Patient presents with  . Abdominal Pain    Susan Martin is a 59 y.o. female.  Patient presents as a transfer from [facility] for surgical consultation.  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  She has had 2 to 3 weeks of crampy abdominal pain and progressively worsening to start having some emesis as well.  She went there she was found to have with concern for abscess and perforation colonic inflammation or neoplasm.  Also with multiple liver findings and other issues however most concerning is the need for urgent surgical consult in [facility] does not have inpatient beds necessary for postoperative care so  was discussed with Dr. Kieth Brightly here and transferred for further management.   Abdominal Pain      Past Medical History:  Diagnosis Date  . Anemia   . Depression   . Hypertension     Patient Active Problem List   Diagnosis Date Noted  . Postop check 05/14/2016  . Vasomotor symptoms due to menopause 05/14/2016    Past Surgical History:  Procedure Laterality Date  . ABDOMINAL HYSTERECTOMY    . ENDOMETRIAL ABLATION    . SALPINGOOPHORECTOMY Left 03/31/2016   Procedure: LEFT SALPINGO OOPHORECTOMY WITH FROZEN SECTION,  ABDOMINAL HYSTERECTOMY WITH BILATERAL SALPINGO-OOPHORECTOMY;  Surgeon: Jonnie Kind, MD;  Location: [facility];  Service: Gynecology;  Laterality: Left;     OB History    Gravida  3   Para  2   Term  2   Preterm      AB  1   Living        SAB  1   TAB      Ectopic      Multiple      Live Births              Family History  Problem Relation Age of Onset  . Hypertension Mother   . Diabetes Mother   . Cirrhosis Father     Social History   Tobacco Use  . Smoking status: Former Smoker    Packs/day:  0.50    Years: 5.00    Pack years: 2.50    Types: Cigarettes    Quit date: 03/28/2015    Years since quitting: 5.0  . Smokeless tobacco: Never Used  Substance Use Topics  . Alcohol use: No  . Drug use: No    Home Medications Prior to Admission medications   Medication Sig Start Date End Date Taking? Authorizing Provider  estradiol (ESTRACE) 1 MG tablet Take 1 tablet (1 mg total) daily by mouth. 05/14/17 05/14/18  Jonnie Kind, MD  ibuprofen (ADVIL,MOTRIN) 600 MG tablet Take 1 tablet (600 mg total) by mouth every 6 (six) hours as needed (mild pain). Patient not taking: Reported on 11/01/2019 04/02/16   Jonnie Kind, MD  oxyCODONE-acetaminophen (PERCOCET/ROXICET) 5-325 MG tablet Take 1-2 tablets by mouth every 4 (four) hours as needed for severe pain (moderate to severe pain (when tolerating fluids)). Patient not taking: Reported on 05/14/2016 04/02/16   Jonnie Kind, MD  PARoxetine (PAXIL) 40 MG tablet Take 1 tablet (40 mg total) by mouth every morning. 11/01/19   Jonnie Kind, MD  polyethylene glycol [facility] / Floria Raveling) packet Take 17 g by mouth every other day.  [provider]  triamterene-hydrochlorothiazide (DYAZIDE) 37.5-25 MG capsule Take 1 each (1 capsule total) by mouth daily. 11/01/19   Jonnie Kind, MD    Allergies    Patient has no known allergies.  Review of Systems   Review of Systems  Gastrointestinal: Positive for abdominal pain.  All other systems reviewed and are negative.   Physical Exam Updated Vital Signs BP (!) 143/80   Pulse (!) 114   Temp 97.7 F (36.5 C) (Oral)   Resp 19   Ht 5\' 3"  (1.6 m)   Wt 61.2 kg   SpO2 100%   BMI 23.91 kg/m   Physical Exam Vitals and nursing note reviewed.  Constitutional:      Appearance: She is well-developed.  HENT:     Head: Normocephalic and atraumatic.     Mouth/Throat:     Mouth: Mucous membranes are moist.     Pharynx: Oropharynx is clear.  Eyes:     Pupils: Pupils are equal, round,  and reactive to light.  Cardiovascular:     Rate and Rhythm: Regular rhythm. Tachycardia present.  Pulmonary:     Effort: No respiratory distress.     Breath sounds: No stridor.  Abdominal:     General: There is distension.     Tenderness: There is abdominal tenderness. There is rebound.  Musculoskeletal:        General: Normal range of motion.     Cervical back: Normal range of motion.  Skin:    General: Skin is warm and dry.  Neurological:     General: No focal deficit present.     Mental Status: She is alert.     ED Results / Procedures / Treatments   Labs (all labs ordered are listed, but only abnormal results are displayed) Labs Reviewed  COMPREHENSIVE METABOLIC PANEL - Abnormal; Notable for the following components:      Result Value   Sodium 130 (*)    Potassium 2.9 (*)    Chloride 93 (*)    Glucose, Bld 137 (*)    Albumin 3.0 (*)    All other components within normal limits  CBC WITH DIFFERENTIAL/PLATELET - Abnormal; Notable for the following components:   WBC 13.2 (*)    Hemoglobin 7.8 (*)    HCT 27.3 (*)    MCV 65.9 (*)    MCH 18.8 (*)    MCHC 28.6 (*)    RDW 16.8 (*)    Platelets 609 (*)    Neutro Abs 12.6 (*)    Lymphs Abs 0.3 (*)    All other components within normal limits  RESPIRATORY PANEL BY RT PCR (FLU A&B, COVID)  LIPASE, BLOOD  MAGNESIUM  PROTIME-INR  URINALYSIS, ROUTINE W REFLEX MICROSCOPIC  PREPARE RBC (CROSSMATCH)  TYPE AND SCREEN  TYPE AND SCREEN    EKG None  Radiology CT ABDOMEN PELVIS W CONTRAST  Result Date: 04/28/2020 CLINICAL DATA:  Abdominal pain, fever EXAM: CT ABDOMEN AND PELVIS WITH CONTRAST TECHNIQUE: Multidetector CT imaging of the abdomen and pelvis was performed using the standard protocol following bolus administration of intravenous contrast. CONTRAST:  75mL OMNIPAQUE IOHEXOL 300 MG/ML  SOLN COMPARISON:  03/09/2016 FINDINGS: Lower chest: No acute abnormality. Hepatobiliary: Numerous low-density masses throughout the  liver are new since prior study. Index measurement of a right hepatic lesion on image 17 measures 3.7 cm in greatest diameter. These are concerning for metastases. Gallbladder unremarkable. Pancreas: No focal abnormality or ductal dilatation. Spleen: No focal abnormality.  Normal size.  Adrenals/Urinary Tract: No adrenal abnormality. No focal renal abnormality. No stones or hydronephrosis. Urinary bladder is unremarkable. Stomach/Bowel: Extensive inflammation noted around the sigmoid colon with locules of extraluminal gas throughout the abdomen and pelvis and large gas and fluid collection adjacent to the sigmoid colon compatible with abscess. Circumferential wall thickening seen within the mid sigmoid colon. Large stool burden throughout the colon. Stomach and small bowel decompressed, unremarkable. Vascular/Lymphatic: No evidence of aneurysm or adenopathy. Reproductive: Prior hysterectomy.  No adnexal masses. Other: Fluid and gas collection in the pelvis mentioned above measures 8.7 x 4.5 cm. Free air in the abdomen and pelvis. Musculoskeletal: No acute bony abnormality. IMPRESSION: Evidence of bowel perforation in the sigmoid colon. There is for pharyngeal wall thickening within the mid sigmoid colon with surrounding extraluminal gas and fluid collections compatible with abscess is. Free air is also noted in the upper abdomen adjacent to the liver. This could reflect perforated diverticulitis or perforated cancer. Numerous low-density masses throughout the liver. Appearance is concerning for metastases. Hepatic abscesses cannot be completely excluded but felt less likely given the Sheer number. Large stool burden throughout the colon. These results were called by telephone at the time of interpretation on 04/28/2020 at 10:26 pm to provider MATTHEW TRIFAN , who verbally acknowledged these results. Electronically Signed   By: Rolm Baptise M.D.   On: 04/28/2020 22:29    Procedures .Critical Care Performed by:  Merrily Pew, MD Authorized by: Merrily Pew, MD   Critical care provider statement:    Critical care time (minutes):  35   Critical care was necessary to treat or prevent imminent or life-threatening deterioration of the following conditions:  Sepsis   Critical care was time spent personally by me on the following activities:  Discussions with consultants, evaluation of patient's response to treatment, examination of patient, ordering and performing treatments and interventions, ordering and review of laboratory studies, ordering and review of radiographic studies, pulse oximetry, re-evaluation of patient's condition, obtaining history from patient or surrogate and review of old charts   (including critical care time)  Medications Ordered in ED Medications  piperacillin-tazobactam (ZOSYN) IVPB 3.375 g (0 g Intravenous Stopped 04/28/20 2302)  0.9 %  sodium chloride infusion (has no administration in time range)  HYDROmorphone (DILAUDID) injection 1 mg (has no administration in time range)  lactated ringers infusion (has no administration in time range)  sodium chloride 0.9 % bolus 1,000 mL (0 mLs Intravenous Stopped 04/28/20 2110)  morphine 4 MG/ML injection 4 mg (4 mg Intravenous Given 04/28/20 2119)  ketorolac (TORADOL) 30 MG/ML injection 15 mg (15 mg Intravenous Given 04/28/20 2119)  potassium chloride 10 mEq in 100 mL IVPB (10 mEq Intravenous New Bag/Given 04/28/20 2230)  sodium chloride 0.9 % bolus 1,000 mL (0 mLs Intravenous Stopped 04/28/20 2234)  iohexol (OMNIPAQUE) 300 MG/ML solution 100 mL (80 mLs Intravenous Contrast Given 04/28/20 2207)  morphine 4 MG/ML injection 4 mg (4 mg Intravenous Given 04/28/20 2256)    ED Course  I have reviewed the triage vital signs and the nursing notes.  Pertinent labs & imaging results that were available during my care of the patient were reviewed by me and considered in my medical decision making (see chart for details).   MDM  Rules/Calculators/A&P                         Review of her labs and imaging from [facility] and appears that she was anemic so a repeat  type and screen will be ordered here.  She is already received antibiotics, pain medications and nausea medications with fluids we will repeat those as she does seem to be symptomatic again.  Dr. Kieth Brightly was paged to make aware of patient's arrival. To the OR and subsequent admission.   Final Clinical Impression(s) / ED Diagnoses Final diagnoses:  None    Rx / DC Orders ED Discharge Orders    None       Rolando Hessling, Corene Cornea, MD 04/29/20 [phone]

## 2020-04-28 NOTE — ED Triage Notes (Signed)
Arrived EMS c/o abd pain, n/v and constipation with loose stools noted.

## 2020-04-28 NOTE — Progress Notes (Signed)
Pharmacy Antibiotic Note  Susan Martin is a 59 y.o. female admitted on 04/28/2020 with intra abdominal infection.  Pharmacy has been consulted for zosyn dosing.  Plan: Zosyn 3.375g IV q8h (4 hour infusion).  Height: 5\' 3"  (160 cm) Weight: 61.2 kg (135 lb) IBW/kg (Calculated) : 52.4  Temp (24hrs), Avg:97.7 F (36.5 C), Min:97.7 F (36.5 C), Max:97.7 F (36.5 C)  Recent Labs  Lab 04/28/20 1950  WBC 13.2*  CREATININE 0.94    Estimated Creatinine Clearance: 52.6 mL/min (by C-G formula based on SCr of 0.94 mg/dL).    No Known Allergies  Antimicrobials this admission: 10/31 zosyn >>   Microbiology results: 10/31 Covid/flu: negative   Thank you for allowing pharmacy to be a part of this patient's care.  Donna Christen Baylin Cabal 04/28/2020 9:31 PM

## 2020-04-28 NOTE — ED Notes (Signed)
Paged Dr. Kieth Brightly to Dr Yevonne Pax

## 2020-04-28 NOTE — ED Provider Notes (Signed)
[facility] EMERGENCY DEPARTMENT Provider Note   CSN: [id number] Arrival date & time: 04/28/20  1846     History Chief Complaint  Patient presents with   Abdominal Pain    Susan Martin is a 59 y.o. female with history of hypertension present emergency department abdominal pain.  The patient reports that 2 to 3 weeks ago she been having cramping pain inside her lower abdomen.  She said intermittent constipation and loose stools.  She reports nausea and vomiting beginning today.  She has a cramping pain is all across her lower abdomen.  She has never had it before.  It comes and goes but never goes away completely.  It is currently a 9 out of 10.  It is throbbing in quality.  She came to the ER today because she was feeling weak and dehydrated having difficulty keeping down any food or fluids.  She reports history of hysterectomy.  She denies any other abdominal surgeries.  She denies any known history of diverticulitis.  She has not received COVID vaccine.  HPI     Past Medical History:  Diagnosis Date   Anemia    Depression    Hypertension     Patient Active Problem List   Diagnosis Date Noted   Postop check 05/14/2016   Vasomotor symptoms due to menopause 05/14/2016    Past Surgical History:  Procedure Laterality Date   ABDOMINAL HYSTERECTOMY     ENDOMETRIAL ABLATION     SALPINGOOPHORECTOMY Left 03/31/2016   Procedure: LEFT SALPINGO OOPHORECTOMY WITH FROZEN SECTION,  ABDOMINAL HYSTERECTOMY WITH BILATERAL SALPINGO-OOPHORECTOMY;  Surgeon: Jonnie Kind, MD;  Location: [facility];  Service: Gynecology;  Laterality: Left;     OB History    Gravida  3   Para  2   Term  2   Preterm      AB  1   Living        SAB  1   TAB      Ectopic      Multiple      Live Births              Family History  Problem Relation Age of Onset   Hypertension Mother    Diabetes Mother    Cirrhosis Father     Social History   Tobacco Use   Smoking  status: Former Smoker    Packs/day: 0.50    Years: 5.00    Pack years: 2.50    Types: Cigarettes    Quit date: 03/28/2015    Years since quitting: 5.0   Smokeless tobacco: Never Used  Substance Use Topics   Alcohol use: No   Drug use: No    Home Medications Prior to Admission medications   Medication Sig Start Date End Date Taking? Authorizing Provider  estradiol (ESTRACE) 1 MG tablet Take 1 tablet (1 mg total) daily by mouth. 05/14/17 05/14/18  Jonnie Kind, MD  ibuprofen (ADVIL,MOTRIN) 600 MG tablet Take 1 tablet (600 mg total) by mouth every 6 (six) hours as needed (mild pain). Patient not taking: Reported on 11/01/2019 04/02/16   Jonnie Kind, MD  oxyCODONE-acetaminophen (PERCOCET/ROXICET) 5-325 MG tablet Take 1-2 tablets by mouth every 4 (four) hours as needed for severe pain (moderate to severe pain (when tolerating fluids)). Patient not taking: Reported on 05/14/2016 04/02/16   Jonnie Kind, MD  PARoxetine (PAXIL) 40 MG tablet Take 1 tablet (40 mg total) by mouth every morning. 11/01/19   Jonnie Kind,  MD  polyethylene glycol (MIRALAX / GLYCOLAX) packet Take 17 g by mouth every other day.    [provider]  triamterene-hydrochlorothiazide (DYAZIDE) 37.5-25 MG capsule Take 1 each (1 capsule total) by mouth daily. 11/01/19   Jonnie Kind, MD    Allergies    Patient has no known allergies.  Review of Systems   Review of Systems  Constitutional: Negative for chills and fever.  HENT: Negative for ear pain and sore throat.   Eyes: Negative for pain and visual disturbance.  Respiratory: Negative for cough and shortness of breath.   Cardiovascular: Negative for chest pain and palpitations.  Gastrointestinal: Positive for abdominal pain, constipation, diarrhea, nausea and vomiting.  Genitourinary: Negative for dysuria and hematuria.  Musculoskeletal: Negative for arthralgias and myalgias.  Skin: Negative for color change and rash.  Neurological: Positive  for light-headedness. Negative for syncope.  Psychiatric/Behavioral: Negative for agitation and confusion.  All other systems reviewed and are negative.   Physical Exam Updated Vital Signs BP (!) 144/75 (BP Location: Right Arm)    Pulse (!) 114    Temp (!) 97.5 F (36.4 C) (Tympanic)    Resp 18    Ht 5\' 3"  (1.6 m)    Wt 61.2 kg    SpO2 97%    BMI 23.91 kg/m   Physical Exam Vitals and nursing note reviewed.  Constitutional:      General: She is not in acute distress.    Appearance: She is well-developed.  HENT:     Head: Normocephalic and atraumatic.  Eyes:     Conjunctiva/sclera: Conjunctivae normal.  Cardiovascular:     Rate and Rhythm: Regular rhythm. Tachycardia present.     Heart sounds: Normal heart sounds.  Pulmonary:     Effort: Pulmonary effort is normal. No respiratory distress.     Breath sounds: Normal breath sounds.  Abdominal:     Palpations: Abdomen is soft.     Tenderness: There is generalized abdominal tenderness. There is no guarding. Negative signs include Murphy's sign.  Musculoskeletal:     Cervical back: Neck supple.  Skin:    General: Skin is warm and dry.  Neurological:     General: No focal deficit present.     Mental Status: She is alert and oriented to person, place, and time.  Psychiatric:        Mood and Affect: Mood normal.        Behavior: Behavior normal.     ED Results / Procedures / Treatments   Labs (all labs ordered are listed, but only abnormal results are displayed) Labs Reviewed  COMPREHENSIVE METABOLIC PANEL - Abnormal; Notable for the following components:      Result Value   Sodium 130 (*)    Potassium 2.9 (*)    Chloride 93 (*)    Glucose, Bld 137 (*)    Albumin 3.0 (*)    All other components within normal limits  CBC WITH DIFFERENTIAL/PLATELET - Abnormal; Notable for the following components:   WBC 13.2 (*)    Hemoglobin 7.8 (*)    HCT 27.3 (*)    MCV 65.9 (*)    MCH 18.8 (*)    MCHC 28.6 (*)    RDW 16.8 (*)     Platelets 609 (*)    Neutro Abs 12.6 (*)    Lymphs Abs 0.3 (*)    All other components within normal limits  RESPIRATORY PANEL BY RT PCR (FLU A&B, COVID)  LIPASE, BLOOD  MAGNESIUM  PROTIME-INR  URINALYSIS, ROUTINE W REFLEX MICROSCOPIC  TYPE AND SCREEN  PREPARE RBC (CROSSMATCH)  TYPE AND SCREEN    EKG None  Radiology CT ABDOMEN PELVIS W CONTRAST  Result Date: 04/28/2020 CLINICAL DATA:  Abdominal pain, fever EXAM: CT ABDOMEN AND PELVIS WITH CONTRAST TECHNIQUE: Multidetector CT imaging of the abdomen and pelvis was performed using the standard protocol following bolus administration of intravenous contrast. CONTRAST:  58mL OMNIPAQUE IOHEXOL 300 MG/ML  SOLN COMPARISON:  03/09/2016 FINDINGS: Lower chest: No acute abnormality. Hepatobiliary: Numerous low-density masses throughout the liver are new since prior study. Index measurement of a right hepatic lesion on image 17 measures 3.7 cm in greatest diameter. These are concerning for metastases. Gallbladder unremarkable. Pancreas: No focal abnormality or ductal dilatation. Spleen: No focal abnormality.  Normal size. Adrenals/Urinary Tract: No adrenal abnormality. No focal renal abnormality. No stones or hydronephrosis. Urinary bladder is unremarkable. Stomach/Bowel: Extensive inflammation noted around the sigmoid colon with locules of extraluminal gas throughout the abdomen and pelvis and large gas and fluid collection adjacent to the sigmoid colon compatible with abscess. Circumferential wall thickening seen within the mid sigmoid colon. Large stool burden throughout the colon. Stomach and small bowel decompressed, unremarkable. Vascular/Lymphatic: No evidence of aneurysm or adenopathy. Reproductive: Prior hysterectomy.  No adnexal masses. Other: Fluid and gas collection in the pelvis mentioned above measures 8.7 x 4.5 cm. Free air in the abdomen and pelvis. Musculoskeletal: No acute bony abnormality. IMPRESSION: Evidence of bowel perforation in the  sigmoid colon. There is for pharyngeal wall thickening within the mid sigmoid colon with surrounding extraluminal gas and fluid collections compatible with abscess is. Free air is also noted in the upper abdomen adjacent to the liver. This could reflect perforated diverticulitis or perforated cancer. Numerous low-density masses throughout the liver. Appearance is concerning for metastases. Hepatic abscesses cannot be completely excluded but felt less likely given the Sheer number. Large stool burden throughout the colon. These results were called by telephone at the time of interpretation on 04/28/2020 at 10:26 pm to provider Sameena Artus , who verbally acknowledged these results. Electronically Signed   By: Rolm Baptise M.D.   On: 04/28/2020 22:29    Procedures .Critical Care Performed by: Wyvonnia Dusky, MD Authorized by: Wyvonnia Dusky, MD   Critical care provider statement:    Critical care time (minutes):  45   Critical care was time spent personally by me on the following activities:  Discussions with consultants, evaluation of patient's response to treatment, examination of patient, ordering and performing treatments and interventions, ordering and review of laboratory studies, ordering and review of radiographic studies, pulse oximetry, re-evaluation of patient's condition, obtaining history from patient or surrogate and review of old charts   (including critical care time)  Medications Ordered in ED Medications  piperacillin-tazobactam (ZOSYN) IVPB 3.375 g ( Intravenous Automatically Held 05/14/20 2200)  HYDROmorphone (DILAUDID) injection 1 mg ( Intravenous MAR Hold 04/29/20 0055)  lactated ringers infusion (has no administration in time range)  sodium chloride 0.9 % bolus 1,000 mL (0 mLs Intravenous Stopped 04/28/20 2110)  morphine 4 MG/ML injection 4 mg (4 mg Intravenous Given 04/28/20 2119)  ketorolac (TORADOL) 30 MG/ML injection 15 mg (15 mg Intravenous Given 04/28/20 2119)   potassium chloride 10 mEq in 100 mL IVPB (10 mEq Intravenous New Bag/Given 04/28/20 2230)  sodium chloride 0.9 % bolus 1,000 mL (0 mLs Intravenous Stopped 04/28/20 2234)  iohexol (OMNIPAQUE) 300 MG/ML solution 100 mL (80 mLs Intravenous Contrast Given 04/28/20 2207)  0.9 %  sodium chloride infusion (10 mL/hr Intravenous New Bag/Given 04/29/20 0024)  morphine 4 MG/ML injection 4 mg (4 mg Intravenous Given 04/28/20 2256)    ED Course  I have reviewed the triage vital signs and the nursing notes.  Pertinent labs & imaging results that were available during my care of the patient were reviewed by me and considered in my medical decision making (see chart for details).   This patient presents to the Emergency Department with complaint of abdominal pain. This involves an extensive number of treatment options, and is a complaint that carries with it a high risk of complications and morbidity.  The differential diagnosis includes gastritis vs constipation vs inflammatory bowel disease vs intraabdominal infection vs bowel perforation vs other  I ordered, reviewed, and interpreted labs.   I ordered medication IV morphine, IV zofran, IV toradol for abdominal pain and/or nausea.  IV zosyn for intraabdominal infection coverage.   I ordered imaging studies which included CT abdomen pelvis  I independently visualized and interpreted imaging which showed intrabdominal perforation and abscess and the monitor tracing which showed sinus tachycardia  I consulted general surgery and discussed lab and imaging findings as noted below   Clinical Course as of Apr 29 114  Sun Apr 28, 2020  2108 HR 112   [MT]  2231 Spoke to radiologist regarding bowel perforation of sigmoid colon with abscess noted.  Surgery paged    [MT]  2249 I was informed by charge staff that we have no available inpatient beds at [facility] and therefore do not have capacity to manage this patient post-operatively.  I discussed the case  with Dr Tenny Craw who is the general surgeon at [facility] and he has accepted the patient as a transfer.  Dr Darl Householder in the [facility] ED accepted as an ED to ED transfer.  The patient was updated about her diagnosis and arrangements are being made for ED to ED transfer to [facility]   [MT]    Clinical Course User Index [MT] Langston Masker Carola Rhine, MD    Final Clinical Impression(s) / ED Diagnoses Final diagnoses:  Bowel perforation [facility])    Rx / DC Orders ED Discharge Orders    None       Kaitlyn Skowron, Carola Rhine, MD 04/29/20 [phone]

## 2020-04-29 ENCOUNTER — Emergency Department (HOSPITAL_COMMUNITY): Payer: Medicaid Other | Admitting: Anesthesiology

## 2020-04-29 ENCOUNTER — Encounter (HOSPITAL_COMMUNITY): Payer: Self-pay | Admitting: Anesthesiology

## 2020-04-29 ENCOUNTER — Other Ambulatory Visit: Payer: Self-pay

## 2020-04-29 ENCOUNTER — Encounter (HOSPITAL_COMMUNITY): Admission: EM | Disposition: A | Discharge status: Home Health | Payer: Self-pay | Source: Home / Self Care

## 2020-04-29 DIAGNOSIS — Z20822 Contact with and (suspected) exposure to covid-19: Secondary | ICD-10-CM | POA: Diagnosis present

## 2020-04-29 DIAGNOSIS — Z87891 Personal history of nicotine dependence: Secondary | ICD-10-CM | POA: Diagnosis not present

## 2020-04-29 DIAGNOSIS — Z90710 Acquired absence of both cervix and uterus: Secondary | ICD-10-CM | POA: Diagnosis not present

## 2020-04-29 DIAGNOSIS — K5720 Diverticulitis of large intestine with perforation and abscess without bleeding: Secondary | ICD-10-CM | POA: Diagnosis present

## 2020-04-29 DIAGNOSIS — F419 Anxiety disorder, unspecified: Secondary | ICD-10-CM | POA: Diagnosis present

## 2020-04-29 DIAGNOSIS — Z9889 Other specified postprocedural states: Secondary | ICD-10-CM

## 2020-04-29 DIAGNOSIS — D75839 Thrombocytosis, unspecified: Secondary | ICD-10-CM | POA: Diagnosis not present

## 2020-04-29 DIAGNOSIS — Z8249 Family history of ischemic heart disease and other diseases of the circulatory system: Secondary | ICD-10-CM | POA: Diagnosis not present

## 2020-04-29 DIAGNOSIS — F32A Depression, unspecified: Secondary | ICD-10-CM | POA: Diagnosis present

## 2020-04-29 DIAGNOSIS — C187 Malignant neoplasm of sigmoid colon: Secondary | ICD-10-CM | POA: Diagnosis present

## 2020-04-29 DIAGNOSIS — D630 Anemia in neoplastic disease: Secondary | ICD-10-CM | POA: Diagnosis present

## 2020-04-29 DIAGNOSIS — I10 Essential (primary) hypertension: Secondary | ICD-10-CM | POA: Diagnosis present

## 2020-04-29 DIAGNOSIS — K658 Other peritonitis: Secondary | ICD-10-CM | POA: Diagnosis present

## 2020-04-29 DIAGNOSIS — K567 Ileus, unspecified: Secondary | ICD-10-CM | POA: Diagnosis present

## 2020-04-29 DIAGNOSIS — Z79899 Other long term (current) drug therapy: Secondary | ICD-10-CM | POA: Diagnosis not present

## 2020-04-29 DIAGNOSIS — E860 Dehydration: Secondary | ICD-10-CM | POA: Diagnosis present

## 2020-04-29 DIAGNOSIS — D509 Iron deficiency anemia, unspecified: Secondary | ICD-10-CM | POA: Diagnosis present

## 2020-04-29 DIAGNOSIS — C787 Secondary malignant neoplasm of liver and intrahepatic bile duct: Secondary | ICD-10-CM | POA: Diagnosis present

## 2020-04-29 HISTORY — PX: PARTIAL COLECTOMY: SHX5273

## 2020-04-29 HISTORY — PX: LAPAROTOMY: SHX154

## 2020-04-29 HISTORY — PX: APPLICATION OF WOUND VAC: SHX5189

## 2020-04-29 LAB — POCT I-STAT, CHEM 8
BUN: 14 mg/dL (ref 6–20)
Calcium, Ion: 1.11 mmol/L — ABNORMAL LOW (ref 1.15–1.40)
Chloride: 100 mmol/L (ref 98–111)
Creatinine, Ser: 0.8 mg/dL (ref 0.44–1.00)
Glucose, Bld: 92 mg/dL (ref 70–99)
HCT: 25 % — ABNORMAL LOW (ref 36.0–46.0)
Hemoglobin: 8.5 g/dL — ABNORMAL LOW (ref 12.0–15.0)
Potassium: 3.1 mmol/L — ABNORMAL LOW (ref 3.5–5.1)
Sodium: 136 mmol/L (ref 135–145)
TCO2: 25 mmol/L (ref 22–32)

## 2020-04-29 LAB — CBC
HCT: 23 % — ABNORMAL LOW (ref 36.0–46.0)
Hemoglobin: 6.6 g/dL — CL (ref 12.0–15.0)
MCH: 18.8 pg — ABNORMAL LOW (ref 26.0–34.0)
MCHC: 28.7 g/dL — ABNORMAL LOW (ref 30.0–36.0)
MCV: 65.3 fL — ABNORMAL LOW (ref 80.0–100.0)
Platelets: 497 10*3/uL — ABNORMAL HIGH (ref 150–400)
RBC: 3.52 MIL/uL — ABNORMAL LOW (ref 3.87–5.11)
RDW: 16.7 % — ABNORMAL HIGH (ref 11.5–15.5)
WBC: 13.7 10*3/uL — ABNORMAL HIGH (ref 4.0–10.5)
nRBC: 0 % (ref 0.0–0.2)

## 2020-04-29 LAB — BASIC METABOLIC PANEL
Anion gap: 10 (ref 5–15)
BUN: 13 mg/dL (ref 6–20)
CO2: 24 mmol/L (ref 22–32)
Calcium: 7.9 mg/dL — ABNORMAL LOW (ref 8.9–10.3)
Chloride: 103 mmol/L (ref 98–111)
Creatinine, Ser: 0.8 mg/dL (ref 0.44–1.00)
GFR, Estimated: >60 mL/min (ref >60)
Glucose, Bld: 108 mg/dL — ABNORMAL HIGH (ref 70–99)
Potassium: 3.4 mmol/L — ABNORMAL LOW (ref 3.5–5.1)
Sodium: 137 mmol/L (ref 135–145)

## 2020-04-29 LAB — HIV ANTIBODY (ROUTINE TESTING W REFLEX): HIV Screen 4th Generation wRfx: NONREACTIVE

## 2020-04-29 LAB — IRON AND TIBC
Iron: 7 ug/dL — ABNORMAL LOW (ref 28–170)
Saturation Ratios: 3 % — ABNORMAL LOW (ref 10.4–31.8)
TIBC: 248 ug/dL — ABNORMAL LOW (ref 250–450)
UIBC: 241 ug/dL

## 2020-04-29 LAB — RETICULOCYTES
Immature Retic Fract: 15.5 % (ref 2.3–15.9)
RBC.: 3.41 MIL/uL — ABNORMAL LOW (ref 3.87–5.11)
Retic Count, Absolute: 34.4 10*3/uL (ref 19.0–186.0)
Retic Ct Pct: 1 % (ref 0.4–3.1)

## 2020-04-29 LAB — HEMOGLOBIN AND HEMATOCRIT, BLOOD
HCT: 25.8 % — ABNORMAL LOW (ref 36.0–46.0)
Hemoglobin: 7.7 g/dL — ABNORMAL LOW (ref 12.0–15.0)

## 2020-04-29 LAB — PREPARE RBC (CROSSMATCH)

## 2020-04-29 LAB — FERRITIN: Ferritin: 57 ng/mL (ref 11–307)

## 2020-04-29 LAB — VITAMIN B12: Vitamin B-12: 639 pg/mL (ref 180–914)

## 2020-04-29 LAB — FOLATE: Folate: 13 ng/mL (ref >5.9)

## 2020-04-29 SURGERY — LAPAROTOMY, EXPLORATORY
Anesthesia: General | Site: Abdomen

## 2020-04-29 MED ORDER — ONDANSETRON 4 MG PO TBDP: 4.0000 mg | ORAL_TABLET | Freq: Four times a day (QID) | ORAL | Status: DC | PRN

## 2020-04-29 MED ORDER — ONDANSETRON HCL 4 MG/2ML IJ SOLN
INTRAMUSCULAR | Status: DC | PRN
  Administered 2020-04-29: 4 mg via INTRAVENOUS

## 2020-04-29 MED ORDER — LACTATED RINGERS IV SOLN: INTRAVENOUS | Status: DC | PRN

## 2020-04-29 MED ORDER — METHOCARBAMOL 1000 MG/10ML IJ SOLN
1000.0000 mg | Freq: Three times a day (TID) | INTRAVENOUS | Status: DC
  Administered 2020-04-29 – 2020-05-06 (×21): 1000 mg via INTRAVENOUS
  Filled 2020-04-29 (×25): qty 10

## 2020-04-29 MED ORDER — DEXTROSE-NACL 5-0.45 % IV SOLN: INTRAVENOUS | Status: DC

## 2020-04-29 MED ORDER — PROPOFOL 10 MG/ML IV BOLUS
INTRAVENOUS | Status: DC | PRN
  Administered 2020-04-29: 90 mg via INTRAVENOUS

## 2020-04-29 MED ORDER — DEXAMETHASONE SODIUM PHOSPHATE 10 MG/ML IJ SOLN
INTRAMUSCULAR | Status: DC | PRN
  Administered 2020-04-29: 5 mg via INTRAVENOUS

## 2020-04-29 MED ORDER — ACETAMINOPHEN 650 MG RE SUPP: 650.0000 mg | Freq: Four times a day (QID) | RECTAL | Status: DC | PRN

## 2020-04-29 MED ORDER — MIDAZOLAM HCL 2 MG/2ML IJ SOLN
INTRAMUSCULAR | Status: AC
  Filled 2020-04-29: qty 2

## 2020-04-29 MED ORDER — MORPHINE SULFATE (PF) 2 MG/ML IV SOLN
1.0000 mg | INTRAVENOUS | Status: DC | PRN
  Administered 2020-04-29 – 2020-05-02 (×9): 2 mg via INTRAVENOUS
  Administered 2020-05-02 (×2): 4 mg via INTRAVENOUS
  Administered 2020-05-02: 2 mg via INTRAVENOUS
  Administered 2020-05-03 – 2020-05-04 (×4): 4 mg via INTRAVENOUS
  Administered 2020-05-05: 2 mg via INTRAVENOUS
  Filled 2020-04-29 (×2): qty 1
  Filled 2020-04-29: qty 2
  Filled 2020-04-29 (×4): qty 1
  Filled 2020-04-29: qty 2
  Filled 2020-04-29 (×5): qty 1
  Filled 2020-04-29: qty 2
  Filled 2020-04-29: qty 1
  Filled 2020-04-29 (×3): qty 2

## 2020-04-29 MED ORDER — ENOXAPARIN SODIUM 40 MG/0.4ML ~~LOC~~ SOLN
40.0000 mg | SUBCUTANEOUS | Status: DC
  Administered 2020-04-30 – 2020-05-08 (×8): 40 mg via SUBCUTANEOUS
  Filled 2020-04-29 (×8): qty 0.4

## 2020-04-29 MED ORDER — STERILE WATER FOR IRRIGATION IR SOLN
Status: DC | PRN
  Administered 2020-04-29: 1000 mL

## 2020-04-29 MED ORDER — CHLORHEXIDINE GLUCONATE CLOTH 2 % EX PADS
6.0000 | MEDICATED_PAD | Freq: Every day | CUTANEOUS | Status: DC
  Administered 2020-04-29: 6 via TOPICAL

## 2020-04-29 MED ORDER — SUCCINYLCHOLINE CHLORIDE 20 MG/ML IJ SOLN
INTRAMUSCULAR | Status: DC | PRN
  Administered 2020-04-29: 160 mg via INTRAVENOUS

## 2020-04-29 MED ORDER — OXYCODONE HCL 5 MG PO TABS
5.0000 mg | ORAL_TABLET | ORAL | Status: DC | PRN
  Administered 2020-04-29: 5 mg via ORAL
  Filled 2020-04-29: qty 1

## 2020-04-29 MED ORDER — ONDANSETRON HCL 4 MG/2ML IJ SOLN
4.0000 mg | Freq: Four times a day (QID) | INTRAMUSCULAR | Status: DC | PRN
  Administered 2020-05-03 – 2020-05-04 (×3): 4 mg via INTRAVENOUS
  Filled 2020-04-29 (×3): qty 2

## 2020-04-29 MED ORDER — LIDOCAINE 2% (20 MG/ML) 5 ML SYRINGE
INTRAMUSCULAR | Status: DC | PRN
  Administered 2020-04-29: 100 mg via INTRAVENOUS

## 2020-04-29 MED ORDER — POTASSIUM CHLORIDE 10 MEQ/100ML IV SOLN
10.0000 meq | INTRAVENOUS | Status: AC
  Administered 2020-04-29 (×4): 10 meq via INTRAVENOUS
  Filled 2020-04-29 (×4): qty 100

## 2020-04-29 MED ORDER — METOPROLOL TARTRATE 5 MG/5ML IV SOLN: 5.0000 mg | Freq: Four times a day (QID) | INTRAVENOUS | Status: DC | PRN

## 2020-04-29 MED ORDER — ENOXAPARIN SODIUM 40 MG/0.4ML ~~LOC~~ SOLN: 40.0000 mg | SUBCUTANEOUS | Status: DC

## 2020-04-29 MED ORDER — DIPHENHYDRAMINE HCL 12.5 MG/5ML PO ELIX: 12.5000 mg | ORAL_SOLUTION | Freq: Four times a day (QID) | ORAL | Status: DC | PRN

## 2020-04-29 MED ORDER — ROCURONIUM BROMIDE 10 MG/ML (PF) SYRINGE
PREFILLED_SYRINGE | INTRAVENOUS | Status: DC | PRN
  Administered 2020-04-29: 10 mg via INTRAVENOUS
  Administered 2020-04-29: 40 mg via INTRAVENOUS

## 2020-04-29 MED ORDER — MEPERIDINE HCL 25 MG/ML IJ SOLN: 6.2500 mg | INTRAMUSCULAR | Status: DC | PRN

## 2020-04-29 MED ORDER — SUGAMMADEX SODIUM 200 MG/2ML IV SOLN
INTRAVENOUS | Status: DC | PRN
  Administered 2020-04-29: 150 mg via INTRAVENOUS

## 2020-04-29 MED ORDER — ACETAMINOPHEN 500 MG PO TABS
1000.0000 mg | ORAL_TABLET | Freq: Four times a day (QID) | ORAL | Status: DC
  Filled 2020-04-29: qty 2

## 2020-04-29 MED ORDER — MORPHINE SULFATE (PF) 2 MG/ML IV SOLN: 2.0000 mg | INTRAVENOUS | Status: DC | PRN

## 2020-04-29 MED ORDER — 0.9 % SODIUM CHLORIDE (POUR BTL) OPTIME
TOPICAL | Status: DC | PRN
  Administered 2020-04-29 (×4): 1000 mL

## 2020-04-29 MED ORDER — OXYCODONE HCL 5 MG PO TABS
5.0000 mg | ORAL_TABLET | ORAL | Status: DC | PRN
  Administered 2020-04-30 – 2020-05-04 (×13): 10 mg via ORAL
  Administered 2020-05-04: 5 mg via ORAL
  Administered 2020-05-04 – 2020-05-08 (×9): 10 mg via ORAL
  Filled 2020-04-29 (×25): qty 2

## 2020-04-29 MED ORDER — FENTANYL CITRATE (PF) 250 MCG/5ML IJ SOLN
INTRAMUSCULAR | Status: AC
  Filled 2020-04-29: qty 5

## 2020-04-29 MED ORDER — DIPHENHYDRAMINE HCL 50 MG/ML IJ SOLN
12.5000 mg | Freq: Four times a day (QID) | INTRAMUSCULAR | Status: DC | PRN
  Administered 2020-04-29: 12.5 mg via INTRAVENOUS
  Filled 2020-04-29 (×2): qty 1

## 2020-04-29 MED ORDER — ONDANSETRON HCL 4 MG/2ML IJ SOLN: 4.0000 mg | Freq: Once | INTRAMUSCULAR | Status: DC | PRN

## 2020-04-29 MED ORDER — ACETAMINOPHEN 10 MG/ML IV SOLN
1000.0000 mg | Freq: Four times a day (QID) | INTRAVENOUS | Status: AC
  Administered 2020-04-29 – 2020-04-30 (×4): 1000 mg via INTRAVENOUS
  Filled 2020-04-29 (×6): qty 100

## 2020-04-29 MED ORDER — ACETAMINOPHEN 325 MG PO TABS: 650.0000 mg | ORAL_TABLET | Freq: Four times a day (QID) | ORAL | Status: DC | PRN

## 2020-04-29 MED ORDER — PHENYLEPHRINE HCL-NACL 10-0.9 MG/250ML-% IV SOLN
INTRAVENOUS | Status: DC | PRN
  Administered 2020-04-29: 50 ug/min via INTRAVENOUS

## 2020-04-29 MED ORDER — SODIUM CHLORIDE 0.9% IV SOLUTION: Freq: Once | INTRAVENOUS | Status: AC

## 2020-04-29 MED ORDER — PROPOFOL 10 MG/ML IV BOLUS
INTRAVENOUS | Status: AC
  Filled 2020-04-29: qty 20

## 2020-04-29 MED ORDER — MIDAZOLAM HCL 5 MG/5ML IJ SOLN
INTRAMUSCULAR | Status: DC | PRN
  Administered 2020-04-29 (×2): 1 mg via INTRAVENOUS

## 2020-04-29 MED ORDER — FENTANYL CITRATE (PF) 100 MCG/2ML IJ SOLN
INTRAMUSCULAR | Status: DC | PRN
  Administered 2020-04-29: 50 ug via INTRAVENOUS
  Administered 2020-04-29 (×2): 25 ug via INTRAVENOUS
  Administered 2020-04-29 (×2): 50 ug via INTRAVENOUS

## 2020-04-29 MED ORDER — HYDROMORPHONE HCL 1 MG/ML IJ SOLN: 0.2500 mg | INTRAMUSCULAR | Status: DC | PRN

## 2020-04-29 SURGICAL SUPPLY — 49 items
BIOPATCH RED 1 DISK 7.0 (GAUZE/BANDAGES/DRESSINGS) ×2 IMPLANT
CANISTER WOUND CARE 500ML ATS (WOUND CARE) ×2 IMPLANT
CHLORAPREP W/TINT 26 (MISCELLANEOUS) ×2 IMPLANT
CLIP VESOCCLUDE LG 6/CT (CLIP) ×2 IMPLANT
CLIP VESOCCLUDE MED 6/CT (CLIP) ×2 IMPLANT
CLIP VESOCCLUDE SM WIDE 6/CT (CLIP) ×2 IMPLANT
COVER SURGICAL LIGHT HANDLE (MISCELLANEOUS) ×2 IMPLANT
COVER WAND RF STERILE (DRAPES) ×2 IMPLANT
DRAIN CHANNEL 19F RND (DRAIN) ×2 IMPLANT
DRAPE LAPAROSCOPIC ABDOMINAL (DRAPES) ×2 IMPLANT
DRAPE WARM FLUID 44X44 (DRAPES) ×2 IMPLANT
DRSG VAC ATS MED SENSATRAC (GAUZE/BANDAGES/DRESSINGS) ×2 IMPLANT
ELECT BLADE 6.5 EXT (BLADE) ×2 IMPLANT
ELECT CAUTERY BLADE 6.4 (BLADE) ×2 IMPLANT
ELECT REM PT RETURN 9FT ADLT (ELECTROSURGICAL) ×2
ELECTRODE REM PT RTRN 9FT ADLT (ELECTROSURGICAL) ×1 IMPLANT
EVACUATOR SILICONE 100CC (DRAIN) ×2 IMPLANT
GLOVE BIOGEL PI IND STRL 7.0 (GLOVE) ×1 IMPLANT
GLOVE BIOGEL PI INDICATOR 7.0 (GLOVE) ×1
GLOVE SURG SS PI 7.0 STRL IVOR (GLOVE) ×4 IMPLANT
GOWN STRL REUS W/ TWL LRG LVL3 (GOWN DISPOSABLE) ×3 IMPLANT
GOWN STRL REUS W/TWL LRG LVL3 (GOWN DISPOSABLE) ×3
HANDLE SUCTION POOLE (INSTRUMENTS) ×1 IMPLANT
KIT BASIN OR (CUSTOM PROCEDURE TRAY) ×2 IMPLANT
KIT OSTOMY DRAINABLE 2.75 STR (WOUND CARE) ×2 IMPLANT
LIGASURE IMPACT 36 18CM CVD LR (INSTRUMENTS) ×4 IMPLANT
NEEDLE 22X1 1/2 (OR ONLY) (NEEDLE) ×2 IMPLANT
PACK GENERAL/GYN (CUSTOM PROCEDURE TRAY) ×2 IMPLANT
PENCIL SMOKE EVACUATOR (MISCELLANEOUS) ×2 IMPLANT
SPONGE LAP 18X18 RF (DISPOSABLE) ×2 IMPLANT
STAPLER CUT CVD 40MM BLUE (STAPLE) ×2 IMPLANT
STAPLER PROXIMATE 75MM BLUE (STAPLE) ×2 IMPLANT
STAPLER VISISTAT 35W (STAPLE) ×2 IMPLANT
SUCTION POOLE HANDLE (INSTRUMENTS) ×2
SUT ETHILON 2 0 FS 18 (SUTURE) ×2 IMPLANT
SUT PDS AB 1 TP1 96 (SUTURE) ×2 IMPLANT
SUT PDS II 0 TP-1 LOOPED 60 (SUTURE) ×4 IMPLANT
SUT PROLENE 2 0 SH DA (SUTURE) ×2 IMPLANT
SUT SILK 2 0 (SUTURE) ×1
SUT SILK 2 0 SH CR/8 (SUTURE) ×2 IMPLANT
SUT SILK 2-0 18XBRD TIE 12 (SUTURE) ×1 IMPLANT
SUT SILK 3 0 (SUTURE) ×1
SUT SILK 3 0 SH CR/8 (SUTURE) ×2 IMPLANT
SUT SILK 3-0 18XBRD TIE 12 (SUTURE) ×1 IMPLANT
SUT VIC AB 2-0 SH 18 (SUTURE) ×2 IMPLANT
SUT VIC AB 2-0 SH 27 (SUTURE) ×1
SUT VIC AB 2-0 SH 27XBRD (SUTURE) ×1 IMPLANT
TOWEL GREEN STERILE (TOWEL DISPOSABLE) ×2 IMPLANT
TRAY FOLEY W/BAG SLVR 14FR (SET/KITS/TRAYS/PACK) ×2 IMPLANT

## 2020-04-29 NOTE — Op Note (Signed)
Preoperative diagnosis: perforated colon  Postoperative diagnosis: same   Procedure: sigmoid colectomy with end colostomy  Surgeon: Gurney Maxin, M.D.  Asst: none  Anesthesia: general  Indications for procedure: Susan Martin is a 59 y.o. year old female with symptoms of abdominal pain, nausea, vomiting, diarrhea constipation and findings concerning with perforated of sigmoid colon.  Description of procedure: The patient was brought into the operative suite. Anesthesia was administered with General endotracheal anesthesia. WHO checklist was applied. The patient was then placed in supine position. The area was prepped and draped in the usual sterile fashion.  Next, a midline incision was made. Cautery was used to dissect through the subcutaneous tissues and fascia. The fascia was safely entered. Upon initial inspection, there was a large amount of purulent and feculent peritonitis which was evacuated. The sigmoid colon was inflamed and dilated proximally. There was a firmness distally. Next, the left White line of Toldt was incised moving proximal. A location of healthy colon was identified for proximal margin. Further blunt and ligasure dissection distally freed the sigmoid and proximal rectum from the surrounding contents. The proximal colon was divided with 75 mm GIA blue load stapler. The rectosigmoid cunction was divided with blue load contour stapler. The rectual stump was sutured with 2 2-0 prolenes on each end of the staple line. A 19 fr blake drain was placed via left abdominal incision with tip in the pelvis. A large amount of fluid was used to irrigated the abdomen and additional poop was removed.  A location on the left abdominal wall was chosen for colostomy location. A circular incision was made in the skin and blunt dissection was used to identify the anterior rectus sheath. A cruciate incision was made through the anterior rectus sheath, the muscle fibers were bluntly dissected to  identify the posterior rectus sheath and a cruciate incision was made in the posterior rectus sheath and dilated to fit the colon. The colon was passed through the site and clamped extracorporeally.  Next, the abdomen was irrigated with warm saline. The peritoneum was closed with a 2-0 vicryl in running fashion The fascia was closed using a 0 PDS in running fashion. The subcutaneous wound was covered with a towel. The ostomy was matured with 3-0 vicryl with a small amount of Brooking in the cardinal direction.The skin was left open and packed with a black sponge wound vac.  Findings: purulent and feculent peritonitis, inflamed sigmoid colon  Specimen: sigmoid colon  Implant: 19 fr blake drain left side  Incision with tip in lower abdomen, vac sponge, ostomy appliance   Blood loss: 100 ml  Local anesthesia: none  Complications: none  Gurney Maxin, M.D. General, Bariatric, & Minimally Invasive Surgery [facility]

## 2020-04-29 NOTE — Anesthesia Procedure Notes (Signed)
Procedure Name: Intubation Date/Time: 04/29/2020 1:05 AM Performed by: Suzy Bouchard, CRNA Pre-anesthesia Checklist: Patient identified, Emergency Drugs available, Suction available, Patient being monitored and Timeout performed Patient Re-evaluated:Patient Re-evaluated prior to induction Oxygen Delivery Method: Circle system utilized Preoxygenation: Pre-oxygenation with 100% oxygen Induction Type: IV induction and Rapid sequence Laryngoscope Size: Miller and 2 Grade View: Grade II Tube type: Oral Tube size: 7.5 mm Number of attempts: 1 Airway Equipment and Method: Stylet Placement Confirmation: ETT inserted through vocal cords under direct vision,  positive ETCO2 and breath sounds checked- equal and bilateral Secured at: 21 cm Tube secured with: Tape Dental Injury: Teeth and Oropharynx as per pre-operative assessment

## 2020-04-29 NOTE — Progress Notes (Signed)
Day of Surgery  Subjective: Patient having some pain as expected.  Pulling only about 500-750 on IS.  475cc of urine since OR.  NGT in place that was not secured to anything and falling out.  ROS: See above, otherwise other systems negative  Objective: Vital signs in last 24 hours: Temp:  [97.5 F (36.4 C)-98.4 F (36.9 C)] 98.1 F (36.7 C) (11/01 0723) Pulse Rate:  [22-117] 98 (11/01 0723) Resp:  [12-22] 17 (11/01 0723) BP: (132-168)/(71-89) 132/71 (11/01 0723) SpO2:  [92 %-100 %] 97 % (11/01 0723) Weight:  [61.2 kg] 61.2 kg (10/31 1920)    Intake/Output from previous day: 10/31 0701 - 11/01 0700 In: 3350 [I.V.:1300; IV Piggyback:2050] Out: 815 [Urine:475; Drains:140; Blood:100] Intake/Output this shift: No intake/output data recorded.  PE: Heart: regular Lungs: CTAB Abd: NGT reinserted and flushed with lopez valve in place.  Midline wound with VAC just placed earlier today.  New LUQ colostomy with viable pink stoma.  No output.  JP drain in place with slightly cloudy serosang output. GU: foley in place with clear yellow urine  Lab Results:  Recent Labs    04/28/20 1950 04/28/20 1950 04/29/20 0121 04/29/20 0839  WBC 13.2*  --   --  13.7*  HGB 7.8*   < > 8.5* 6.6*  HCT 27.3*   < > 25.0* 23.0*  PLT 609*  --   --  497*   < > = values in this interval not displayed.   BMET Recent Labs    04/28/20 1950 04/28/20 1950 04/29/20 0121 04/29/20 0856  NA 130*   < > 136 137  K 2.9*   < > 3.1* 3.4*  CL 93*   < > 100 103  CO2 22  --   --  24  GLUCOSE 137*   < > 92 108*  BUN 19   < > 14 13  CREATININE 0.94   < > 0.80 0.80  CALCIUM 8.9  --   --  7.9*   < > = values in this interval not displayed.   PT/INR Recent Labs    04/28/20 1950  LABPROT 13.8  INR 1.1   CMP     Component Value Date/Time   NA 137 04/29/2020 0856   NA 140 03/05/2016 1201   K 3.4 (L) 04/29/2020 0856   CL 103 04/29/2020 0856   CO2 24 04/29/2020 0856   GLUCOSE 108 (H) 04/29/2020 0856    BUN 13 04/29/2020 0856   BUN 10 03/05/2016 1201   CREATININE 0.80 04/29/2020 0856   CALCIUM 7.9 (L) 04/29/2020 0856   PROT 7.6 04/28/2020 1950   PROT 7.4 03/05/2016 1201   ALBUMIN 3.0 (L) 04/28/2020 1950   ALBUMIN 4.0 03/05/2016 1201   AST 24 04/28/2020 1950   ALT 13 04/28/2020 1950   ALKPHOS 102 04/28/2020 1950   BILITOT 0.9 04/28/2020 1950   BILITOT 0.3 03/05/2016 1201   GFRNONAA >60 04/29/2020 0856   GFRAA >60 04/01/2016 0541   Lipase     Component Value Date/Time   LIPASE 13 04/28/2020 1950       Studies/Results: CT ABDOMEN PELVIS W CONTRAST  Result Date: 04/28/2020 CLINICAL DATA:  Abdominal pain, fever EXAM: CT ABDOMEN AND PELVIS WITH CONTRAST TECHNIQUE: Multidetector CT imaging of the abdomen and pelvis was performed using the standard protocol following bolus administration of intravenous contrast. CONTRAST:  18mL OMNIPAQUE IOHEXOL 300 MG/ML  SOLN COMPARISON:  03/09/2016 FINDINGS: Lower chest: No acute abnormality. Hepatobiliary: Numerous low-density masses  throughout the liver are new since prior study. Index measurement of a right hepatic lesion on image 17 measures 3.7 cm in greatest diameter. These are concerning for metastases. Gallbladder unremarkable. Pancreas: No focal abnormality or ductal dilatation. Spleen: No focal abnormality.  Normal size. Adrenals/Urinary Tract: No adrenal abnormality. No focal renal abnormality. No stones or hydronephrosis. Urinary bladder is unremarkable. Stomach/Bowel: Extensive inflammation noted around the sigmoid colon with locules of extraluminal gas throughout the abdomen and pelvis and large gas and fluid collection adjacent to the sigmoid colon compatible with abscess. Circumferential wall thickening seen within the mid sigmoid colon. Large stool burden throughout the colon. Stomach and small bowel decompressed, unremarkable. Vascular/Lymphatic: No evidence of aneurysm or adenopathy. Reproductive: Prior hysterectomy.  No adnexal masses.  Other: Fluid and gas collection in the pelvis mentioned above measures 8.7 x 4.5 cm. Free air in the abdomen and pelvis. Musculoskeletal: No acute bony abnormality. IMPRESSION: Evidence of bowel perforation in the sigmoid colon. There is for pharyngeal wall thickening within the mid sigmoid colon with surrounding extraluminal gas and fluid collections compatible with abscess is. Free air is also noted in the upper abdomen adjacent to the liver. This could reflect perforated diverticulitis or perforated cancer. Numerous low-density masses throughout the liver. Appearance is concerning for metastases. Hepatic abscesses cannot be completely excluded but felt less likely given the Sheer number. Large stool burden throughout the colon. These results were called by telephone at the time of interpretation on 04/28/2020 at 10:26 pm to provider MATTHEW TRIFAN , who verbally acknowledged these results. Electronically Signed   By: Rolm Baptise M.D.   On: 04/28/2020 22:29    Anti-infectives: Anti-infectives (From admission, onward)   Start     Dose/Rate Route Frequency Ordered Stop   04/28/20 2130  piperacillin-tazobactam (ZOSYN) IVPB 3.375 g        3.375 g 12.5 mL/hr over 240 Minutes Intravenous Every 8 hours 04/28/20 2123         Assessment/Plan HTN - home med on hold while NGT in place, prn IV meds ordered Anemia - unclear etiology.  Admit hgb 7.8.  Down to 6.6 this am.  Will transfuse 1 unit.  Will also order an anemia panel.  Will need c-scope as outpatient post op.   DOS s/p ex lap with Hartman's procedure for perforated diverticulitis with abscess, Dr. Kieth Brightly 11/1 -keep foley til POD 1 -monitor JP drain and continue with this for now -cont NGT and await bowel function -follow labs -pulm toilet/IS -mobilize -check labs in am -await path  FEN - replace K, IVFs VTE - lovenox to start tomorrow ID - [email or web address]   LOS: 0 days    Henreitta Cea , [facility] 04/29/2020, 9:59  AM Please see Amion for pager number during day hours 7:00am-4:30pm or 7:00am -11:30am on weekends

## 2020-04-29 NOTE — H&P (Signed)
Reason for Consult: abdominal pain Referring Physician: Maelys Kinnick is an 59 y.o. female.  HPI: 59 yo female with 2 weeks of abdominal pain diarrhea and constipation. She initially thought it was related to her paxil but it did not improve. Pain is constant. Worse with movement. It is a dull ache. She has nausea and vomiting. She denies fevers.  Past Medical History:  Diagnosis Date  . Anemia   . Depression   . Hypertension     Past Surgical History:  Procedure Laterality Date  . ABDOMINAL HYSTERECTOMY    . ENDOMETRIAL ABLATION    . SALPINGOOPHORECTOMY Left 03/31/2016   Procedure: LEFT SALPINGO OOPHORECTOMY WITH FROZEN SECTION,  ABDOMINAL HYSTERECTOMY WITH BILATERAL SALPINGO-OOPHORECTOMY;  Surgeon: Jonnie Kind, MD;  Location: [facility];  Service: Gynecology;  Laterality: Left;    Family History  Problem Relation Age of Onset  . Hypertension Mother   . Diabetes Mother   . Cirrhosis Father     Social History:  reports that she quit smoking about 5 years ago. Her smoking use included cigarettes. She has a 2.50 pack-year smoking history. She has never used smokeless tobacco. She reports that she does not drink alcohol and does not use drugs.  Allergies: No Known Allergies  Medications: I have reviewed the patient's current medications.  Results for orders placed or performed during the hospital encounter of 04/28/20 (from the past 48 hour(s))  Respiratory Panel by RT PCR (Flu A&B, Covid) - Nasopharyngeal Swab     Status: None   Collection Time: 04/28/20  7:50 PM   Specimen: Nasopharyngeal Swab  Result Value Ref Range   SARS Coronavirus 2 by RT PCR NEGATIVE NEGATIVE    Comment: (NOTE) SARS-CoV-2 target nucleic acids are NOT DETECTED.  The SARS-CoV-2 RNA is generally detectable in upper respiratoy specimens during the acute phase of infection. The lowest concentration of SARS-CoV-2 viral copies this assay can detect is 131 copies/mL. A negative result does  not preclude SARS-Cov-2 infection and should not be used as the sole basis for treatment or other patient management decisions. A negative result may occur with  improper specimen collection/handling, submission of specimen other than nasopharyngeal swab, presence of viral mutation(s) within the areas targeted by this assay, and inadequate number of viral copies (<131 copies/mL). A negative result must be combined with clinical observations, patient history, and epidemiological information. The expected result is Negative.  Fact Sheet for Patients:  PinkCheek.be  Fact Sheet for Healthcare Providers:  GravelBags.it  This test is no t yet approved or cleared by the [place] FDA and  has been authorized for detection and/or diagnosis of SARS-CoV-2 by FDA under an Emergency Use Authorization (EUA). This EUA will remain  in effect (meaning this test can be used) for the duration of the COVID-19 declaration under Section 564(b)(1) of the Act, 21 U.S.C. section 360bbb-3(b)(1), unless the authorization is terminated or revoked sooner.     Influenza A by PCR NEGATIVE NEGATIVE   Influenza B by PCR NEGATIVE NEGATIVE    Comment: (NOTE) The Xpert Xpress SARS-CoV-2/FLU/RSV assay is intended as an aid in  the diagnosis of influenza from Nasopharyngeal swab specimens and  should not be used as a sole basis for treatment. Nasal washings and  aspirates are unacceptable for Xpert Xpress SARS-CoV-2/FLU/RSV  testing.  Fact Sheet for Patients: PinkCheek.be  Fact Sheet for Healthcare Providers: GravelBags.it  This test is not yet approved or cleared by the [place] FDA and  has  been authorized for detection and/or diagnosis of SARS-CoV-2 by  FDA under an Emergency Use Authorization (EUA). This EUA will remain  in effect (meaning this test can be used) for the duration of the   Covid-19 declaration under Section 564(b)(1) of the Act, 21  U.S.C. section 360bbb-3(b)(1), unless the authorization is  terminated or revoked. Performed at [name], [address], [place], [place] [address]   Comprehensive metabolic panel     Status: Abnormal   Collection Time: 04/28/20  7:50 PM  Result Value Ref Range   Sodium 130 (L) 135 - 145 mmol/L   Potassium 2.9 (L) 3.5 - 5.1 mmol/L   Chloride 93 (L) 98 - 111 mmol/L   CO2 22 22 - 32 mmol/L   Glucose, Bld 137 (H) 70 - 99 mg/dL    Comment: Glucose reference range applies only to samples taken after fasting for at least 8 hours.   BUN 19 6 - 20 mg/dL   Creatinine, Ser 0.94 0.44 - 1.00 mg/dL   Calcium 8.9 8.9 - 10.3 mg/dL   Total Protein 7.6 6.5 - 8.1 g/dL   Albumin 3.0 (L) 3.5 - 5.0 g/dL   AST 24 15 - 41 U/L   ALT 13 0 - 44 U/L   Alkaline Phosphatase 102 38 - 126 U/L   Total Bilirubin 0.9 0.3 - 1.2 mg/dL   GFR, Estimated >60 >60 mL/min    Comment: (NOTE) Calculated using the CKD-EPI Creatinine Equation (2021)    Anion gap 15 5 - 15    Comment: Performed at [facility], [address], [place], [place] [address]  CBC with Differential     Status: Abnormal   Collection Time: 04/28/20  7:50 PM  Result Value Ref Range   WBC 13.2 (H) 4.0 - 10.5 K/uL   RBC 4.14 3.87 - 5.11 MIL/uL   Hemoglobin 7.8 (L) 12.0 - 15.0 g/dL    Comment: Reticulocyte Hemoglobin testing may be clinically indicated, consider ordering this additional test [id number]    HCT 27.3 (L) 36 - 46 %   MCV 65.9 (L) 80.0 - 100.0 fL   MCH 18.8 (L) 26.0 - 34.0 pg   MCHC 28.6 (L) 30.0 - 36.0 g/dL   RDW 16.8 (H) 11.5 - 15.5 %   Platelets 609 (H) 150 - 400 K/uL   nRBC 0.0 0.0 - 0.2 %   Neutrophils Relative % 94 %   Neutro Abs 12.6 (H) 1.7 - 7.7 K/uL   Lymphocytes Relative 2 %   Lymphs Abs 0.3 (L) 0.7 - 4.0 K/uL   Monocytes Relative 3 %   Monocytes Absolute 0.3 0.1 - 1.0 K/uL   Eosinophils Relative 0 %   Eosinophils Absolute 0.0 0.0 - 0.5 K/uL    Basophils Relative 0 %   Basophils Absolute 0.0 0.0 - 0.1 K/uL   Immature Granulocytes 1 %   Abs Immature Granulocytes 0.06 0.00 - 0.07 K/uL    Comment: Performed at [facility], [address] [place], [place] [address]  Lipase, blood     Status: None   Collection Time: 04/28/20  7:50 PM  Result Value Ref Range   Lipase 13 11 - 51 U/L    Comment: Performed at [facility], [address] [place], [place] [address]  Protime-INR     Status: None   Collection Time: 04/28/20  7:50 PM  Result Value Ref Range   Prothrombin Time 13.8 11.4 - 15.2 seconds   INR 1.1 0.8 - 1.2  Comment: (NOTE) INR goal varies based on device and disease states. Performed at [facility], [address] [place], [place] [address]   Magnesium     Status: None   Collection Time: 04/28/20  9:06 PM  Result Value Ref Range   Magnesium 1.8 1.7 - 2.4 mg/dL    Comment: Performed at [facility], [address], [place], [place] [address]    CT ABDOMEN PELVIS W CONTRAST  Result Date: 04/28/2020 CLINICAL DATA:  Abdominal pain, fever EXAM: CT ABDOMEN AND PELVIS WITH CONTRAST TECHNIQUE: Multidetector CT imaging of the abdomen and pelvis was performed using the standard protocol following bolus administration of intravenous contrast. CONTRAST:  76mL OMNIPAQUE IOHEXOL 300 MG/ML  SOLN COMPARISON:  03/09/2016 FINDINGS: Lower chest: No acute abnormality. Hepatobiliary: Numerous low-density masses throughout the liver are new since prior study. Index measurement of a right hepatic lesion on image 17 measures 3.7 cm in greatest diameter. These are concerning for metastases. Gallbladder unremarkable. Pancreas: No focal abnormality or ductal dilatation. Spleen: No focal abnormality.  Normal size. Adrenals/Urinary Tract: No adrenal abnormality. No focal renal abnormality. No stones or hydronephrosis. Urinary bladder is unremarkable. Stomach/Bowel: Extensive inflammation noted around the sigmoid colon with locules of extraluminal gas  throughout the abdomen and pelvis and large gas and fluid collection adjacent to the sigmoid colon compatible with abscess. Circumferential wall thickening seen within the mid sigmoid colon. Large stool burden throughout the colon. Stomach and small bowel decompressed, unremarkable. Vascular/Lymphatic: No evidence of aneurysm or adenopathy. Reproductive: Prior hysterectomy.  No adnexal masses. Other: Fluid and gas collection in the pelvis mentioned above measures 8.7 x 4.5 cm. Free air in the abdomen and pelvis. Musculoskeletal: No acute bony abnormality. IMPRESSION: Evidence of bowel perforation in the sigmoid colon. There is for pharyngeal wall thickening within the mid sigmoid colon with surrounding extraluminal gas and fluid collections compatible with abscess is. Free air is also noted in the upper abdomen adjacent to the liver. This could reflect perforated diverticulitis or perforated cancer. Numerous low-density masses throughout the liver. Appearance is concerning for metastases. Hepatic abscesses cannot be completely excluded but felt less likely given the Sheer number. Large stool burden throughout the colon. These results were called by telephone at the time of interpretation on 04/28/2020 at 10:26 pm to provider MATTHEW TRIFAN , who verbally acknowledged these results. Electronically Signed   By: Rolm Baptise M.D.   On: 04/28/2020 22:29   Review of Systems  Constitutional: Negative for chills and fever.  HENT: Negative for hearing loss.   Eyes: Negative for blurred vision and double vision.  Respiratory: Negative for cough and hemoptysis.   Cardiovascular: Negative for chest pain and palpitations.  Gastrointestinal: Positive for abdominal pain, nausea and vomiting.  Genitourinary: Negative for dysuria and urgency.  Musculoskeletal: Negative for myalgias and neck pain.  Skin: Negative for itching and rash.  Neurological: Negative for dizziness, tingling and headaches.  Endo/Heme/Allergies:  Does not bruise/bleed easily.  Psychiatric/Behavioral: Negative for depression and suicidal ideas.   PE Blood pressure (!) 143/80, pulse (!) 114, temperature 97.7 F (36.5 C), temperature source Oral, resp. rate 19, height 5\' 3"  (1.6 m), weight 61.2 kg, SpO2 100 %. Constitutional: NAD; conversant; no deformities Eyes: Moist conjunctiva; no lid lag; anicteric; PERRL Neck: Trachea midline; no thyromegaly Lungs: Normal respiratory effort; no tactile fremitus CV: RRR; no palpable thrills; no pitting edema GI: Abd soft, distended, +guarding throughout abdomen; no palpable hepatosplenomegaly MSK: unable to assess gait; no clubbing/cyanosis Psychiatric: Appropriate affect; alert and  oriented x3 Lymphatic: No palpable cervical or axillary lymphadenopathy   Assessment/Plan: 59 yo female with concern for diverticulitis with perforation and large fluid in the lower abdomen/pelvis and small amount of gas throughout the abdomen. -OR for colectomy/exploration -IV abx -type and screen -admit to surgery post op  Arta Bruce Moon Budde 04/29/2020, 12:03 AM

## 2020-04-29 NOTE — ED Notes (Signed)
To the OR.

## 2020-04-29 NOTE — ED Notes (Signed)
Dr. Kieth Brightly in to assess pt

## 2020-04-29 NOTE — Anesthesia Postprocedure Evaluation (Signed)
Anesthesia Post Note  Patient: Susan Martin  Procedure(s) Performed: EXPLORATORY LAPAROTOMY (N/A Abdomen) PARTIAL COLECTOMY WITH END COLOSTOMY (N/A Abdomen) APPLICATION OF WOUND VAC (N/A Abdomen)     Patient location during evaluation: PACU Anesthesia Type: General Level of consciousness: awake and alert Pain management: pain level controlled Vital Signs Assessment: post-procedure vital signs reviewed and stable Respiratory status: spontaneous breathing, nonlabored ventilation, respiratory function stable and patient connected to nasal cannula oxygen Cardiovascular status: blood pressure returned to baseline and stable Postop Assessment: no apparent nausea or vomiting Anesthetic complications: no   No complications documented.  Last Vitals:  Vitals:   04/29/20 0341 04/29/20 0409  BP: (!) 158/74 (!) 161/73  Pulse: (!) 111 (!) 108  Resp: 12 18  Temp:  36.8 C  SpO2: 100% 95%    Last Pain:  Vitals:   04/29/20 0409  TempSrc: Oral  PainSc:                  [name] DAVID

## 2020-04-29 NOTE — Anesthesia Preprocedure Evaluation (Addendum)
Anesthesia Evaluation  Patient identified by MRN, date of birth, ID band Patient awake    Reviewed: Allergy & Precautions, NPO status , Patient's Chart, lab work & pertinent test results  Airway Mallampati: I  TM Distance: >3 FB Neck ROM: Full    Dental  (+) Teeth Intact, Dental Advisory Given, Caps   Pulmonary former smoker,    Pulmonary exam normal        Cardiovascular hypertension, Pt. on medications Normal cardiovascular exam     Neuro/Psych Depression    GI/Hepatic   Endo/Other    Renal/GU      Musculoskeletal   Abdominal   Peds  Hematology   Anesthesia Other Findings   Reproductive/Obstetrics                            Anesthesia Physical Anesthesia Plan  ASA: III and emergent  Anesthesia Plan: General   Post-op Pain Management:    Induction: Intravenous, Rapid sequence and Cricoid pressure planned  PONV Risk Score and Plan: 3 and Ondansetron, Midazolam and Dexamethasone  Airway Management Planned: Oral ETT  Additional Equipment:   Intra-op Plan:   Post-operative Plan: Extubation in OR  Informed Consent: I have reviewed the patients History and Physical, chart, labs and discussed the procedure including the risks, benefits and alternatives for the proposed anesthesia with the patient or authorized representative who has indicated his/her understanding and acceptance.       Plan Discussed with: CRNA and Surgeon  Anesthesia Plan Comments:         Anesthesia Quick Evaluation

## 2020-04-29 NOTE — Progress Notes (Signed)
Initial Nutrition Assessment  DOCUMENTATION CODES:   Not applicable  INTERVENTION:   -RD will follow for diet advancement and add supplements as appropriate -If prolonged NPO status is anticipated, consider initiation of nutrition support  NUTRITION DIAGNOSIS:   Increased nutrient needs related to post-op healing as evidenced by estimated needs.  GOAL:   Patient will meet greater than or equal to 90% of their needs  MONITOR:   Diet advancement, Labs, Weight trends, Skin, I & O's  REASON FOR ASSESSMENT:   Malnutrition Screening Tool    ASSESSMENT:   59 yo female with 2 weeks of abdominal pain diarrhea and constipation. She initially thought it was related to her paxil but it did not improve. Pain is constant. Worse with movement. It is a dull ache. She has nausea and vomiting. She denies fevers  Pt admitted with diverticulitis with perforation.   11/1- s/p Procedure: sigmoid colectomy with end colostomy  Reviewed I/O's: +2.5 L x 24 hours  UOP: 475 ml x 24 hours  Drain output: 140 ml x 24 hours  Pt sleeping soundly at time of visit. She did not respond to voice or touch.   Pt currently NPO; NGT connected to low, intermittent suction. No colostomy output yet.   Reviewed wt hx; wt has been stable over the past 5 months.   Medications reviewed and include dextrose 5%-0.45% sodium chloride infusion @ 75 ml/hr.   Labs reviewed: K: 3.1.   NUTRITION - FOCUSED PHYSICAL EXAM:    Most Recent Value  Orbital Region No depletion  Upper Arm Region No depletion  Thoracic and Lumbar Region No depletion  Buccal Region No depletion  Temple Region No depletion  Clavicle Bone Region Mild depletion  Clavicle and Acromion Bone Region No depletion  Scapular Bone Region No depletion  Dorsal Hand No depletion  Patellar Region No depletion  Anterior Thigh Region No depletion  Posterior Calf Region No depletion  Edema (RD Assessment) None  Hair Reviewed  Eyes Reviewed  Mouth  Reviewed  Skin Reviewed  Nails Reviewed       Diet Order:   Diet Order            Diet NPO time specified Except for: Sips with Meds  Diet effective now                 EDUCATION NEEDS:   No education needs have been identified at this time  Skin:  Skin Assessment: Skin Integrity Issues: Skin Integrity Issues:: Wound VAC Wound Vac: abdomen  Last BM:  Unknown (with new colostomy- no output yet)  Height:   Ht Readings from Last 1 Encounters:  04/28/20 5\' 3"  (1.6 m)    Weight:   Wt Readings from Last 1 Encounters:  04/28/20 61.2 kg    Ideal Body Weight:  52.3 kg  BMI:  Body mass index is 23.91 kg/m.  Estimated Nutritional Needs:   Kcal:  1800-2000  Protein:  105-120 grams  Fluid:  > 1.8 L    Loistine Chance, RD, LDN, [place] Registered Dietitian II Certified Diabetes Care and Education Specialist Please refer to [facility] for RD and/or RD on-call/weekend/after hours pager

## 2020-04-29 NOTE — Transfer of Care (Signed)
Immediate Anesthesia Transfer of Care Note  Patient: Susan Martin  Procedure(s) Performed: EXPLORATORY LAPAROTOMY (N/A Abdomen) PARTIAL COLECTOMY WITH END COLOSTOMY (N/A Abdomen) APPLICATION OF WOUND VAC (N/A Abdomen)  Patient Location: PACU  Anesthesia Type:General  Level of Consciousness: awake and alert   Airway & Oxygen Therapy: Patient Spontanous Breathing and Patient connected to nasal cannula oxygen  Post-op Assessment: Report given to RN, Post -op Vital signs reviewed and stable and Patient moving all extremities X 4  Post vital signs: Reviewed and stable  Last Vitals:  Vitals Value Taken Time  BP 150/73 04/29/20 0312  Temp    Pulse 112 04/29/20 0314  Resp 20 04/29/20 0314  SpO2 94 % 04/29/20 0314  Vitals shown include unvalidated device data.  Last Pain:  Vitals:   04/29/20 0311  TempSrc:   PainSc: (P) 0-No pain         Complications: No complications documented.

## 2020-04-29 NOTE — Consult Note (Signed)
WOC Nurse Consult Note: Patient receiving care in [facility] [id number]. Reason for Consult: abdominal VAC dressing changes; to begin 11/3 Wound type: surgical Pressure Injury POA: Yes/No/NA VAC supplies have been requested by [place].  [place] Nurse ostomy follow up Stoma type/location: LUQ colostomy 2 piece pouching system and barrier rings requested by [place].  2 and 3/4 inches.  Orders entered. Will begin teaching 11/2. Val Riles, RN, MSN, CWOCN, CNS-BC, pager [phone]

## 2020-04-30 ENCOUNTER — Encounter (HOSPITAL_COMMUNITY): Payer: Self-pay | Admitting: General Surgery

## 2020-04-30 LAB — TYPE AND SCREEN
ABO/RH(D): A POS
Antibody Screen: NEGATIVE
Unit division: 0

## 2020-04-30 LAB — MAGNESIUM: Magnesium: 2.3 mg/dL (ref 1.7–2.4)

## 2020-04-30 LAB — BASIC METABOLIC PANEL
Anion gap: 8 (ref 5–15)
BUN: 18 mg/dL (ref 6–20)
CO2: 27 mmol/L (ref 22–32)
Calcium: 8.1 mg/dL — ABNORMAL LOW (ref 8.9–10.3)
Chloride: 100 mmol/L (ref 98–111)
Creatinine, Ser: 0.82 mg/dL (ref 0.44–1.00)
GFR, Estimated: >60 mL/min (ref >60)
Glucose, Bld: 130 mg/dL — ABNORMAL HIGH (ref 70–99)
Potassium: 3.4 mmol/L — ABNORMAL LOW (ref 3.5–5.1)
Sodium: 135 mmol/L (ref 135–145)

## 2020-04-30 LAB — CBC
HCT: 23.9 % — ABNORMAL LOW (ref 36.0–46.0)
Hemoglobin: 7.2 g/dL — ABNORMAL LOW (ref 12.0–15.0)
MCH: 20.5 pg — ABNORMAL LOW (ref 26.0–34.0)
MCHC: 30.1 g/dL (ref 30.0–36.0)
MCV: 67.9 fL — ABNORMAL LOW (ref 80.0–100.0)
Platelets: 434 10*3/uL — ABNORMAL HIGH (ref 150–400)
RBC: 3.52 MIL/uL — ABNORMAL LOW (ref 3.87–5.11)
RDW: 18.2 % — ABNORMAL HIGH (ref 11.5–15.5)
WBC: 10.4 10*3/uL (ref 4.0–10.5)
nRBC: 0 % (ref 0.0–0.2)

## 2020-04-30 LAB — BPAM RBC
Blood Product Expiration Date: 202111182359
ISSUE DATE / TIME: 202111011054
Unit Type and Rh: 6200

## 2020-04-30 LAB — PHOSPHORUS: Phosphorus: 2.7 mg/dL (ref 2.5–4.6)

## 2020-04-30 MED ORDER — SODIUM CHLORIDE 0.9 % IV SOLN
510.0000 mg | Freq: Once | INTRAVENOUS | Status: AC
  Administered 2020-04-30: 510 mg via INTRAVENOUS
  Filled 2020-04-30: qty 17

## 2020-04-30 MED ORDER — POTASSIUM CHLORIDE 2 MEQ/ML IV SOLN: INTRAVENOUS | Status: DC

## 2020-04-30 MED ORDER — MENTHOL 3 MG MT LOZG
1.0000 | LOZENGE | OROMUCOSAL | Status: DC | PRN
  Filled 2020-04-30: qty 9

## 2020-04-30 MED ORDER — PHENOL 1.4 % MT LIQD: 1.0000 | OROMUCOSAL | Status: DC | PRN

## 2020-04-30 MED ORDER — POTASSIUM CHLORIDE 10 MEQ/100ML IV SOLN
10.0000 meq | INTRAVENOUS | Status: AC
  Administered 2020-04-30 (×4): 10 meq via INTRAVENOUS
  Filled 2020-04-30 (×4): qty 100

## 2020-04-30 MED ORDER — KCL IN DEXTROSE-NACL 20-5-0.45 MEQ/L-%-% IV SOLN
INTRAVENOUS | Status: DC
  Filled 2020-04-30 (×6): qty 1000

## 2020-04-30 MED ORDER — ACETAMINOPHEN 10 MG/ML IV SOLN
1000.0000 mg | Freq: Four times a day (QID) | INTRAVENOUS | Status: AC
  Administered 2020-04-30 – 2020-05-01 (×4): 1000 mg via INTRAVENOUS
  Filled 2020-04-30 (×4): qty 100

## 2020-04-30 MED ORDER — PAROXETINE HCL 20 MG PO TABS
40.0000 mg | ORAL_TABLET | Freq: Every day | ORAL | Status: DC
  Administered 2020-04-30: 40 mg via NASOGASTRIC
  Filled 2020-04-30 (×2): qty 2

## 2020-04-30 NOTE — Progress Notes (Signed)
1 Day Post-Op  Subjective: Patient having a lot of pain in her throat this am.  Did not get up to the chair yesterday, but is going to today and to ambulate in halls she says.  Minimal NGT output around 400cc yesterday.  ROS: See above, otherwise other systems negative  Objective: Vital signs in last 24 hours: Temp:  [97.7 F (36.5 C)-98.5 F (36.9 C)] 97.7 F (36.5 C) (11/02 0758) Pulse Rate:  [85-100] 85 (11/02 0758) Resp:  [16-18] 18 (11/02 0758) BP: (134-160)/(71-85) 146/74 (11/02 0758) SpO2:  [95 %-98 %] 97 % (11/02 0758)    Intake/Output from previous day: 11/01 0701 - 11/02 0700 In: 1593.6 [I.V.:183.7; Blood:627.5; IV Piggyback:782.4] Out: 1620 [Urine:1000; Emesis/NG output:400; Drains:220] Intake/Output this shift: No intake/output data recorded.  PE: Heart: regular Lungs: CTAB Abd: soft, few BS, ostomy with no air just sweat, stoma is pink and viable, JP drain with serous fluid, 220cc documented yesterday, wound VAC in place.  Lab Results:  Recent Labs    04/29/20 0839 04/29/20 0839 04/29/20 1846 04/30/20 0237  WBC 13.7*  --   --  10.4  HGB 6.6*   < > 7.7* 7.2*  HCT 23.0*   < > 25.8* 23.9*  PLT 497*  --   --  434*   < > = values in this interval not displayed.   BMET Recent Labs    04/29/20 0856 04/30/20 0237  NA 137 135  K 3.4* 3.4*  CL 103 100  CO2 24 27  GLUCOSE 108* 130*  BUN 13 18  CREATININE 0.80 0.82  CALCIUM 7.9* 8.1*   PT/INR Recent Labs    04/28/20 1950  LABPROT 13.8  INR 1.1   CMP     Component Value Date/Time   NA 135 04/30/2020 0237   NA 140 03/05/2016 1201   K 3.4 (L) 04/30/2020 0237   CL 100 04/30/2020 0237   CO2 27 04/30/2020 0237   GLUCOSE 130 (H) 04/30/2020 0237   BUN 18 04/30/2020 0237   BUN 10 03/05/2016 1201   CREATININE 0.82 04/30/2020 0237   CALCIUM 8.1 (L) 04/30/2020 0237   PROT 7.6 04/28/2020 1950   PROT 7.4 03/05/2016 1201   ALBUMIN 3.0 (L) 04/28/2020 1950   ALBUMIN 4.0 03/05/2016 1201   AST 24  04/28/2020 1950   ALT 13 04/28/2020 1950   ALKPHOS 102 04/28/2020 1950   BILITOT 0.9 04/28/2020 1950   BILITOT 0.3 03/05/2016 1201   GFRNONAA >60 04/30/2020 0237   GFRAA >60 04/01/2016 0541   Lipase     Component Value Date/Time   LIPASE 13 04/28/2020 1950       Studies/Results: CT ABDOMEN PELVIS W CONTRAST  Result Date: 04/28/2020 CLINICAL DATA:  Abdominal pain, fever EXAM: CT ABDOMEN AND PELVIS WITH CONTRAST TECHNIQUE: Multidetector CT imaging of the abdomen and pelvis was performed using the standard protocol following bolus administration of intravenous contrast. CONTRAST:  91mL OMNIPAQUE IOHEXOL 300 MG/ML  SOLN COMPARISON:  03/09/2016 FINDINGS: Lower chest: No acute abnormality. Hepatobiliary: Numerous low-density masses throughout the liver are new since prior study. Index measurement of a right hepatic lesion on image 17 measures 3.7 cm in greatest diameter. These are concerning for metastases. Gallbladder unremarkable. Pancreas: No focal abnormality or ductal dilatation. Spleen: No focal abnormality.  Normal size. Adrenals/Urinary Tract: No adrenal abnormality. No focal renal abnormality. No stones or hydronephrosis. Urinary bladder is unremarkable. Stomach/Bowel: Extensive inflammation noted around the sigmoid colon with locules of extraluminal gas throughout the  abdomen and pelvis and large gas and fluid collection adjacent to the sigmoid colon compatible with abscess. Circumferential wall thickening seen within the mid sigmoid colon. Large stool burden throughout the colon. Stomach and small bowel decompressed, unremarkable. Vascular/Lymphatic: No evidence of aneurysm or adenopathy. Reproductive: Prior hysterectomy.  No adnexal masses. Other: Fluid and gas collection in the pelvis mentioned above measures 8.7 x 4.5 cm. Free air in the abdomen and pelvis. Musculoskeletal: No acute bony abnormality. IMPRESSION: Evidence of bowel perforation in the sigmoid colon. There is for pharyngeal  wall thickening within the mid sigmoid colon with surrounding extraluminal gas and fluid collections compatible with abscess is. Free air is also noted in the upper abdomen adjacent to the liver. This could reflect perforated diverticulitis or perforated cancer. Numerous low-density masses throughout the liver. Appearance is concerning for metastases. Hepatic abscesses cannot be completely excluded but felt less likely given the [place] number. Large stool burden throughout the colon. These results were called by telephone at the time of interpretation on 04/28/2020 at 10:26 pm to provider MATTHEW TRIFAN , who verbally acknowledged these results. Electronically Signed   By: Rolm Baptise M.D.   On: 04/28/2020 22:29    Anti-infectives: Anti-infectives (From admission, onward)   Start     Dose/Rate Route Frequency Ordered Stop   04/28/20 2130  piperacillin-tazobactam (ZOSYN) IVPB 3.375 g        3.375 g 12.5 mL/hr over 240 Minutes Intravenous Every 8 hours 04/28/20 2123         Assessment/Plan HTN - home med on hold while NGT in place, prn IV meds ordered Iron deficiency anemia - panel shows significant deficiency in iron.  Will give iron transfusion today and then will need to be on supplementation when able to in orals. Depression - resume paxil  POD 1 s/p ex lap with Hartman's procedure for perforated diverticulitis with abscess, Dr. Kieth Brightly 11/1 -DC foley -monitor JP drain and continue with this for now -cont NGT and await bowel function -follow labs, WBC trending down -pulm toilet/IS -mobilize -await path  FEN - replace K, and K to IVFs, mag and phos are normal VTE - lovenox to start today ID - [email or web address]   LOS: 1 day    Henreitta Cea , [facility] 04/30/2020, 9:22 AM Please see Amion for pager number during day hours 7:00am-4:30pm or 7:00am -11:30am on weekends

## 2020-04-30 NOTE — Consult Note (Signed)
[place] Nurse ostomy follow up Stoma type/location: LLQ, end colstomy Stomal assessment/size: 1 3/4" visualized through pouch Peristomal assessment: NA Treatment options for stomal/peristomal skin: NA Output sweat; scant bloody Ostomy pouching: 2pc. 2 3/4" in place and over VAC dressing Will required both dressing and pouch change at the same time.  Education provided:  Explained role of ostomy nurse and creation of stoma  Explained stoma characteristics (budded, flush, color, texture, care) Left educational materials in the patient's room; discussed with patient needs to have support person in the home. She is not sure if her boyfriend or her sister will be support person. I will follow up with patient later today on timing for dressing change and ostomy teaching tomorrow. Will need to attempt to offset ostomy pouching from NPWT dressing if possible.   Enrolled patient in [place] Start Discharge program: Yes  [place] Nurse will follow along with you for continued support with ostomy teaching and care [place] MSN, [place], [place], [place], [place]

## 2020-04-30 NOTE — Plan of Care (Signed)

## 2020-04-30 NOTE — Plan of Care (Signed)
  Problem: Activity: Goal: Risk for activity intolerance will decrease Outcome: Progressing   Problem: Elimination: Goal: Will not experience complications related to urinary retention Outcome: Completed/Met   Problem: Pain Managment: Goal: General experience of comfort will improve Outcome: Progressing   

## 2020-05-01 ENCOUNTER — Encounter (HOSPITAL_COMMUNITY): Payer: Self-pay

## 2020-05-01 LAB — URINALYSIS, ROUTINE W REFLEX MICROSCOPIC
Bilirubin Urine: NEGATIVE
Glucose, UA: NEGATIVE mg/dL
Hgb urine dipstick: NEGATIVE
Ketones, ur: NEGATIVE mg/dL
Leukocytes,Ua: NEGATIVE
Nitrite: NEGATIVE
Protein, ur: NEGATIVE mg/dL
Specific Gravity, Urine: 1.025 (ref 1.005–1.030)
pH: 6 (ref 5.0–8.0)

## 2020-05-01 LAB — BASIC METABOLIC PANEL
Anion gap: 9 (ref 5–15)
BUN: 13 mg/dL (ref 6–20)
CO2: 26 mmol/L (ref 22–32)
Calcium: 8.1 mg/dL — ABNORMAL LOW (ref 8.9–10.3)
Chloride: 100 mmol/L (ref 98–111)
Creatinine, Ser: 0.69 mg/dL (ref 0.44–1.00)
GFR, Estimated: >60 mL/min (ref >60)
Glucose, Bld: 105 mg/dL — ABNORMAL HIGH (ref 70–99)
Potassium: 3.4 mmol/L — ABNORMAL LOW (ref 3.5–5.1)
Sodium: 135 mmol/L (ref 135–145)

## 2020-05-01 LAB — CBC
HCT: 24.5 % — ABNORMAL LOW (ref 36.0–46.0)
Hemoglobin: 7.2 g/dL — ABNORMAL LOW (ref 12.0–15.0)
MCH: 19.9 pg — ABNORMAL LOW (ref 26.0–34.0)
MCHC: 29.4 g/dL — ABNORMAL LOW (ref 30.0–36.0)
MCV: 67.9 fL — ABNORMAL LOW (ref 80.0–100.0)
Platelets: 419 10*3/uL — ABNORMAL HIGH (ref 150–400)
RBC: 3.61 MIL/uL — ABNORMAL LOW (ref 3.87–5.11)
RDW: 18.2 % — ABNORMAL HIGH (ref 11.5–15.5)
WBC: 9.7 10*3/uL (ref 4.0–10.5)
nRBC: 0 % (ref 0.0–0.2)

## 2020-05-01 MED ORDER — ACETAMINOPHEN 500 MG PO TABS
1000.0000 mg | ORAL_TABLET | Freq: Four times a day (QID) | ORAL | Status: DC
  Administered 2020-05-01 – 2020-05-08 (×26): 1000 mg via ORAL
  Filled 2020-05-01 (×26): qty 2

## 2020-05-01 MED ORDER — TRIAMTERENE-HCTZ 37.5-25 MG PO TABS
1.0000 | ORAL_TABLET | Freq: Every day | ORAL | Status: DC
  Administered 2020-05-01 – 2020-05-08 (×8): 1 via ORAL
  Filled 2020-05-01 (×8): qty 1

## 2020-05-01 MED ORDER — ALPRAZOLAM 0.25 MG PO TABS
0.2500 mg | ORAL_TABLET | Freq: Two times a day (BID) | ORAL | Status: DC | PRN
  Administered 2020-05-02 – 2020-05-08 (×10): 0.25 mg via ORAL
  Filled 2020-05-01 (×10): qty 1

## 2020-05-01 MED ORDER — PAROXETINE HCL 20 MG PO TABS
40.0000 mg | ORAL_TABLET | Freq: Every day | ORAL | Status: DC
  Administered 2020-05-01 – 2020-05-08 (×8): 40 mg via ORAL
  Filled 2020-05-01 (×7): qty 2

## 2020-05-01 MED ORDER — POTASSIUM CHLORIDE 20 MEQ/15ML (10%) PO SOLN
40.0000 meq | Freq: Two times a day (BID) | ORAL | Status: AC
  Administered 2020-05-01 (×2): 40 meq via ORAL
  Filled 2020-05-01 (×2): qty 30

## 2020-05-01 MED ORDER — BOOST / RESOURCE BREEZE PO LIQD CUSTOM
1.0000 | Freq: Three times a day (TID) | ORAL | Status: DC
  Administered 2020-05-01: 1 via ORAL

## 2020-05-01 NOTE — Progress Notes (Signed)
2 Days Post-Op  Subjective: Sitting up in chair.  Voiding well with foley removed.  Hears some flatus in bag.  Unclear that pain is well controlled.  Not sure based off of questions that patient is aware she needs to ask for pain medicine.  RN states she just gave her oxy.  Patient asked if she got more than just Tylenol for pain...  ROS: See above, otherwise other systems negative  Objective: Vital signs in last 24 hours: Temp:  [97.7 F (36.5 C)-98.3 F (36.8 C)] 98.3 F (36.8 C) (11/03 0844) Pulse Rate:  [74-83] 75 (11/03 0844) Resp:  [17] 17 (11/03 0844) BP: (142-162)/(66-79) 162/66 (11/03 0844) SpO2:  [92 %-95 %] 95 % (11/03 0844)    Intake/Output from previous day: 11/02 0701 - 11/03 0700 In: 3211.3 [P.O.:960; I.V.:1000; IV Piggyback:1251.3] Out: 751 [Urine:351; Emesis/NG output:350; Drains:50] Intake/Output this shift: Total I/O In: -  Out: 150 [Emesis/NG output:150]  PE: Heart: regular Lungs: CTAB Abd: soft, appropriately tender, vac in place (scheduled for first change today), ostomy with small amount of air present along with some brown colored thin liquid.  Stoma is pink and viable.  JP drain with serous fluid present.  NGT with only 150cc of output documented yesterday.  Lab Results:  Recent Labs    04/30/20 0237 05/01/20 0156  WBC 10.4 9.7  HGB 7.2* 7.2*  HCT 23.9* 24.5*  PLT 434* 419*   BMET Recent Labs    04/30/20 0237 05/01/20 0156  NA 135 135  K 3.4* 3.4*  CL 100 100  CO2 27 26  GLUCOSE 130* 105*  BUN 18 13  CREATININE 0.82 0.69  CALCIUM 8.1* 8.1*   PT/INR Recent Labs    04/28/20 1950  LABPROT 13.8  INR 1.1   CMP     Component Value Date/Time   NA 135 05/01/2020 0156   NA 140 03/05/2016 1201   K 3.4 (L) 05/01/2020 0156   CL 100 05/01/2020 0156   CO2 26 05/01/2020 0156   GLUCOSE 105 (H) 05/01/2020 0156   BUN 13 05/01/2020 0156   BUN 10 03/05/2016 1201   CREATININE 0.69 05/01/2020 0156   CALCIUM 8.1 (L) 05/01/2020 0156    PROT 7.6 04/28/2020 1950   PROT 7.4 03/05/2016 1201   ALBUMIN 3.0 (L) 04/28/2020 1950   ALBUMIN 4.0 03/05/2016 1201   AST 24 04/28/2020 1950   ALT 13 04/28/2020 1950   ALKPHOS 102 04/28/2020 1950   BILITOT 0.9 04/28/2020 1950   BILITOT 0.3 03/05/2016 1201   GFRNONAA >60 05/01/2020 0156   GFRAA >60 04/01/2016 0541   Lipase     Component Value Date/Time   LIPASE 13 04/28/2020 1950       Studies/Results: No results found.  Anti-infectives: Anti-infectives (From admission, onward)   Start     Dose/Rate Route Frequency Ordered Stop   04/28/20 2130  piperacillin-tazobactam (ZOSYN) IVPB 3.375 g        3.375 g 12.5 mL/hr over 240 Minutes Intravenous Every 8 hours 04/28/20 2123         Assessment/Plan HTN - resume home med Iron deficiency anemia - s/p iron transfusion, will start iron when tolerating more solid diet Depression - paxil  POD 2, s/p ex lap with Hartman's procedure for perforated diverticulitis with abscess, Dr. Kieth Brightly 11/1 -WBC normal, hgb stable -some flatus in ostomy pouch, will DC NGT and give clear liquids -PT/OT for mobilization and recommendations for home.  Sister is going to help her at  home as well as BF when he isn't at work. -cont JP drain for now -pulm toilet/IS -mobilize -await path  FEN -replace K, add K to IVFs, mag and phos are normal VTE -lovenox  ID -[email or web address]   LOS: 2 days    Henreitta Cea , [facility] 05/01/2020, 9:31 AM Please see Amion for pager number during day hours 7:00am-4:30pm or 7:00am -11:30am on weekends

## 2020-05-01 NOTE — Consult Note (Addendum)
[facility]  Telephone:([phone] Fax:(336) [phone]   [place]  Referral MD: Dr. Gurney Maxin  Reason for Referral: Colon adenocarcinoma  HPI: Susan Martin is a 59 year old female with a past medical history significant for hypertension, depression, anemia.  The patient presented to the emergency room due to abdominal pain, diarrhea, constipation.  She had a 2-week history of abdominal pain which was constant and worse with movement.  CT of the abdomen/pelvis performed on 04/28/2020 showed a bowel perforation in the sigmoid colon with pharyngeal wall thickening within the mid sigmoid colon within the surrounding extraluminal gas and fluid collections compatible with abscess.  There was also free air in the upper abdomen adjacent to the liver which could reflect perforated diverticulitis or perforated cancer.  There were numerous low-density masses throughout the liver in appearance is concerning for metastases.  The patient was taken to the OR on 04/29/2020 for sigmoid colectomy with end colostomy.  Surgical pathology showed adenocarcinoma, moderately differentiated, 3 cm.  There was lymphovascular space and perineural invasion present, 0/8 lymph nodes positive, one tumor deposit present.  MMR and MSI testing pending.  When I initially saw the patient, there was no family at the bedside.  The patient provided some history to me but asked for me to return to speak to her boyfriend, [name].  The patient tells me that she has been having abdominal pain at home which is gotten progressively worse.  She noticed some black stools.  She did not develop any nausea or vomiting until she arrived to the hospital.  She reports that her appetite has been poor recently and is not sure if she has lost weight.  She is not complaining of any headaches, dizziness, chest pain, shortness of breath.  She continues to have abdominal pain following surgery.  Medical oncology was  asked to see the patient to make recommendations regarding her newly diagnosed colon adenocarcinoma.    Past Medical History:  Diagnosis Date  . Anemia   . Depression   . Hypertension   :  Past Surgical History:  Procedure Laterality Date  . ABDOMINAL HYSTERECTOMY    . APPLICATION OF WOUND VAC N/A 04/29/2020   Procedure: APPLICATION OF WOUND VAC;  Surgeon: Kinsinger, Arta Bruce, MD;  Location: [place];  Service: General;  Laterality: N/A;  . ENDOMETRIAL ABLATION    . LAPAROTOMY N/A 04/29/2020   Procedure: EXPLORATORY LAPAROTOMY;  Surgeon: Kieth Brightly Arta Bruce, MD;  Location: [place];  Service: General;  Laterality: N/A;  . PARTIAL COLECTOMY N/A 04/29/2020   Procedure: PARTIAL COLECTOMY WITH END COLOSTOMY;  Surgeon: Kieth Brightly Arta Bruce, MD;  Location: [place];  Service: General;  Laterality: N/A;  . SALPINGOOPHORECTOMY Left 03/31/2016   Procedure: LEFT SALPINGO OOPHORECTOMY WITH FROZEN SECTION,  ABDOMINAL HYSTERECTOMY WITH BILATERAL SALPINGO-OOPHORECTOMY;  Surgeon: Jonnie Kind, MD;  Location: [facility];  Service: Gynecology;  Laterality: Left;  :  Current Facility-Administered Medications  Medication Dose Route Frequency Provider Last Rate Last Admin  . acetaminophen (TYLENOL) tablet 1,000 mg  1,000 mg Oral Q6H Saverio Danker, PA-C   1,000 mg at 05/01/20 1822  . ALPRAZolam Duanne Moron) tablet 0.25 mg  0.25 mg Oral BID PRN Saverio Danker, PA-C      . dextrose 5 % and 0.45 % NaCl with KCl 20 mEq/L infusion   Intravenous Continuous Joselyn Glassman A, RPH 75 mL/hr at 05/01/20 1054 New Bag at 05/01/20 1054  . diphenhydrAMINE (BENADRYL) 12.5 MG/5ML elixir 12.5 mg  12.5 mg  Oral Q6H PRN Kinsinger, Arta Bruce, MD       Or  . diphenhydrAMINE (BENADRYL) injection 12.5 mg  12.5 mg Intravenous Q6H PRN Kinsinger, Arta Bruce, MD   12.5 mg at 04/29/20 2108  . enoxaparin (LOVENOX) injection 40 mg  40 mg Subcutaneous Q24H Saverio Danker, PA-C   40 mg at 05/01/20 1051  . feeding supplement (BOOST / RESOURCE  BREEZE) liquid 1 Container  1 Container Oral TID BM Saverio Danker, PA-C   1 Container at 05/01/20 1402  . menthol-cetylpyridinium (CEPACOL) lozenge 3 mg  1 lozenge Oral PRN Saverio Danker, PA-C      . methocarbamol (ROBAXIN) 1,000 mg in dextrose 5 % 100 mL IVPB  1,000 mg Intravenous Q8H Saverio Danker, PA-C 200 mL/hr at 05/01/20 1359 1,000 mg at 05/01/20 1359  . metoprolol tartrate (LOPRESSOR) injection 5 mg  5 mg Intravenous Q6H PRN Kinsinger, Arta Bruce, MD      . morphine 2 MG/ML injection 1-4 mg  1-4 mg Intravenous Q2H PRN Saverio Danker, PA-C   2 mg at 05/01/20 1047  . ondansetron (ZOFRAN-ODT) disintegrating tablet 4 mg  4 mg Oral Q6H PRN Kinsinger, Arta Bruce, MD       Or  . ondansetron [facility]) injection 4 mg  4 mg Intravenous Q6H PRN Kinsinger, Arta Bruce, MD      . oxyCODONE (Oxy IR/ROXICODONE) immediate release tablet 5-10 mg  5-10 mg Oral Q4H PRN Saverio Danker, PA-C   10 mg at 05/01/20 1532  . PARoxetine (PAXIL) tablet 40 mg  40 mg Oral Daily Pierce, Dwayne A, RPH   40 mg at 05/01/20 1042  . phenol (CHLORASEPTIC) mouth spray 1 spray  1 spray Mouth/Throat PRN Saverio Danker, PA-C      . piperacillin-tazobactam (ZOSYN) IVPB 3.375 g  3.375 g Intravenous Q8H Kinsinger, Arta Bruce, MD 12.5 mL/hr at 05/01/20 1402 3.375 g at 05/01/20 1402  . potassium chloride 20 MEQ/15ML (10%) solution 40 mEq  40 mEq Oral BID Saverio Danker, PA-C   40 mEq at 05/01/20 1124  . triamterene-hydrochlorothiazide (MAXZIDE-25) 37.5-25 MG per tablet 1 tablet  1 tablet Oral Daily Saverio Danker, PA-C   1 tablet at 05/01/20 1125     No Known Allergies:  Family History  Problem Relation Age of Onset  . Hypertension Mother   . Diabetes Mother   . Cirrhosis Father   :  Social History   Socioeconomic History  . Marital status: Divorced    Spouse name: Not on file  . Number of children: Not on file  . Years of education: Not on file  . Highest education level: Not on file  Occupational History  . Not on  file  Tobacco Use  . Smoking status: Former Smoker    Packs/day: 0.50    Years: 5.00    Pack years: 2.50    Types: Cigarettes    Quit date: 03/28/2015    Years since quitting: 5.0  . Smokeless tobacco: Never Used  Substance and Sexual Activity  . Alcohol use: No  . Drug use: No  . Sexual activity: Yes    Partners: Male    Birth control/protection: None  Other Topics Concern  . Not on file  Social History Narrative  . Not on file   Social Determinants of Health   Financial Resource Strain: Low Risk   . Difficulty of Paying Living Expenses: Not very hard  Food Insecurity: No Food Insecurity  . Worried About Charity fundraiser in the Last  Year: Never true  . Ran Out of Food in the Last Year: Never true  Transportation Needs: No Transportation Needs  . Lack of Transportation (Medical): No  . Lack of Transportation (Non-Medical): No  Physical Activity: Insufficiently Active  . Days of Exercise per Week: 2 days  . Minutes of Exercise per Session: 30 min  Stress: Stress Concern Present  . Feeling of Stress : To some extent  Social Connections: Moderately Integrated  . Frequency of Communication with Friends and Family: More than three times a week  . Frequency of Social Gatherings with Friends and Family: Once a week  . Attends Religious Services: More than 4 times per year  . Active Member of Clubs or Organizations: No  . Attends Archivist Meetings: Never  . Marital Status: Living with partner  Intimate Partner Violence: Not At Risk  . Fear of Current or Ex-Partner: No  . Emotionally Abused: No  . Physically Abused: No  . Sexually Abused: No  :  Review of Systems: A comprehensive 14 point review of systems was negative except as noted in the HPI.  Exam: Patient Vitals for the past 24 hrs:  BP Temp Temp src Pulse Resp SpO2  05/01/20 1823 (!) 175/74 98.5 F (36.9 C) Oral 76 16 100 %  05/01/20 0844 (!) 162/66 98.3 F (36.8 C) -- 75 17 95 %  05/01/20 0446  (!) 158/78 -- -- 74 17 95 %  04/30/20 2150 (!) 148/79 97.7 F (36.5 C) Oral 83 17 92 %    General: Awake and alert, appears to have abdominal pain Eyes:  no scleral icterus.   ENT:  There were no oropharyngeal lesions.   Lymphatics:  Negative cervical, supraclavicular or axillary adenopathy.   Respiratory: lungs were clear bilaterally without wheezing or crackles.   Cardiovascular:  Regular rate and rhythm, S1/S2, without murmur, rub or gallop.  There was no pedal edema.   GI: Positive bowel sounds, soft, mild tenderness, midline back in place, JP drain in place, left upper quadrant colostomy pink, no stool in colostomy bag Musculoskeletal: Strength symmetrical in the upper and lower extremities Skin exam was without echymosis, petichae.   Neuro exam was nonfocal. Patient was alert and oriented.  Tearful at times and asked that I direct my discussion to her boyfriend.   Lab Results  Component Value Date   WBC 9.7 05/01/2020   HGB 7.2 (L) 05/01/2020   HCT 24.5 (L) 05/01/2020   PLT 419 (H) 05/01/2020   GLUCOSE 105 (H) 05/01/2020   ALT 13 04/28/2020   AST 24 04/28/2020   NA 135 05/01/2020   K 3.4 (L) 05/01/2020   CL 100 05/01/2020   CREATININE 0.69 05/01/2020   BUN 13 05/01/2020   CO2 26 05/01/2020    CT ABDOMEN PELVIS W CONTRAST  Result Date: 04/28/2020 CLINICAL DATA:  Abdominal pain, fever EXAM: CT ABDOMEN AND PELVIS WITH CONTRAST TECHNIQUE: Multidetector CT imaging of the abdomen and pelvis was performed using the standard protocol following bolus administration of intravenous contrast. CONTRAST:  72m OMNIPAQUE IOHEXOL 300 MG/ML  SOLN COMPARISON:  03/09/2016 FINDINGS: Lower chest: No acute abnormality. Hepatobiliary: Numerous low-density masses throughout the liver are new since prior study. Index measurement of a right hepatic lesion on image 17 measures 3.7 cm in greatest diameter. These are concerning for metastases. Gallbladder unremarkable. Pancreas: No focal abnormality or  ductal dilatation. Spleen: No focal abnormality.  Normal size. Adrenals/Urinary Tract: No adrenal abnormality. No focal renal abnormality. No stones or  hydronephrosis. Urinary bladder is unremarkable. Stomach/Bowel: Extensive inflammation noted around the sigmoid colon with locules of extraluminal gas throughout the abdomen and pelvis and large gas and fluid collection adjacent to the sigmoid colon compatible with abscess. Circumferential wall thickening seen within the mid sigmoid colon. Large stool burden throughout the colon. Stomach and small bowel decompressed, unremarkable. Vascular/Lymphatic: No evidence of aneurysm or adenopathy. Reproductive: Prior hysterectomy.  No adnexal masses. Other: Fluid and gas collection in the pelvis mentioned above measures 8.7 x 4.5 cm. Free air in the abdomen and pelvis. Musculoskeletal: No acute bony abnormality. IMPRESSION: Evidence of bowel perforation in the sigmoid colon. There is for pharyngeal wall thickening within the mid sigmoid colon with surrounding extraluminal gas and fluid collections compatible with abscess is. Free air is also noted in the upper abdomen adjacent to the liver. This could reflect perforated diverticulitis or perforated cancer. Numerous low-density masses throughout the liver. Appearance is concerning for metastases. Hepatic abscesses cannot be completely excluded but felt less likely given the Sheer number. Large stool burden throughout the colon. These results were called by telephone at the time of interpretation on 04/28/2020 at 10:26 pm to provider MATTHEW TRIFAN , who verbally acknowledged these results. Electronically Signed   By: Rolm Baptise M.D.   On: 04/28/2020 22:29     CT ABDOMEN PELVIS W CONTRAST  Result Date: 04/28/2020 CLINICAL DATA:  Abdominal pain, fever EXAM: CT ABDOMEN AND PELVIS WITH CONTRAST TECHNIQUE: Multidetector CT imaging of the abdomen and pelvis was performed using the standard protocol following bolus  administration of intravenous contrast. CONTRAST:  68m OMNIPAQUE IOHEXOL 300 MG/ML  SOLN COMPARISON:  03/09/2016 FINDINGS: Lower chest: No acute abnormality. Hepatobiliary: Numerous low-density masses throughout the liver are new since prior study. Index measurement of a right hepatic lesion on image 17 measures 3.7 cm in greatest diameter. These are concerning for metastases. Gallbladder unremarkable. Pancreas: No focal abnormality or ductal dilatation. Spleen: No focal abnormality.  Normal size. Adrenals/Urinary Tract: No adrenal abnormality. No focal renal abnormality. No stones or hydronephrosis. Urinary bladder is unremarkable. Stomach/Bowel: Extensive inflammation noted around the sigmoid colon with locules of extraluminal gas throughout the abdomen and pelvis and large gas and fluid collection adjacent to the sigmoid colon compatible with abscess. Circumferential wall thickening seen within the mid sigmoid colon. Large stool burden throughout the colon. Stomach and small bowel decompressed, unremarkable. Vascular/Lymphatic: No evidence of aneurysm or adenopathy. Reproductive: Prior hysterectomy.  No adnexal masses. Other: Fluid and gas collection in the pelvis mentioned above measures 8.7 x 4.5 cm. Free air in the abdomen and pelvis. Musculoskeletal: No acute bony abnormality. IMPRESSION: Evidence of bowel perforation in the sigmoid colon. There is for pharyngeal wall thickening within the mid sigmoid colon with surrounding extraluminal gas and fluid collections compatible with abscess is. Free air is also noted in the upper abdomen adjacent to the liver. This could reflect perforated diverticulitis or perforated cancer. Numerous low-density masses throughout the liver. Appearance is concerning for metastases. Hepatic abscesses cannot be completely excluded but felt less likely given the Sheer number. Large stool burden throughout the colon. These results were called by telephone at the time of  interpretation on 04/28/2020 at 10:26 pm to provider MATTHEW TRIFAN , who verbally acknowledged these results. Electronically Signed   By: KRolm BaptiseM.D.   On: 04/28/2020 22:29    Pathology:  SURGICAL PATHOLOGY  CASE: [id number]  PATIENT: Susan Martin Surgical Pathology Report   Clinical History: diverticulitis (cm)    FINAL  MICROSCOPIC DIAGNOSIS:   A. COLON, SIGMOID, RESECTION:  - Adenocarcinoma, moderately differentiated, 3 cm  - Lymphovascular space and perineural invasion present  - No carcinoma identified in eight lymph nodes (0/8)  - One tumor deposit present  - See oncology table and comment below   ONCOLOGY TABLE:   COLON AND RECTUM: Resection, Including Transanal Disk Excision of  Rectal Neoplasms   Procedure: Sigmoidectomy  Tumor Site: Sigmoid  Tumor Size: 3.0 cm  Macroscopic Tumor Perforation: Present  Histologic Type: Adenocarcinoma  Histologic Grade: G2, moderate  Tumor Extension: Invades visceral peritoneum  Margins: Tumor involves inked mesenteric margin; see comment  Treatment Effect: No known presurgical therapy  Lymphovascular Invasion: Present (large vessel (venous, intramural)  Perineural Invasion: Present  Tumor Deposits: Present (1)  Regional Lymph Nodes:    Number of Lymph Nodes Involved: 0    Number of Lymph Nodes Examined: 8  Pathologic Stage Classification (pTNM, AJCC 8th Edition): pT4a, pN1c  Ancillary Studies: MMR / MSI testing will be ordered.  Representative Tumor Block: A7  Comments: There is tumor present in the area of perforation (inked at  the time of gross). Focal tumor is present on ink; therefore, the  mesenteric margin is considered positive. The proximal and distal  margins are uninvolved by carcinoma.   Dr. Kieth Brightly was notified of these results on May 01, 2020. Dr.  Saralyn Pilar reviewed the case and agrees with the above diagnosis.  (v4.1.0.0)   GROSS DESCRIPTION:   Specimen: Sigmoid colon segment,  unoriented, received fresh.  Specimen integrity: There is a 0.5 cm in diameter transmural defect  involving the mesenteric margin, which is contiguous with an ulcerated  mucosal mass. The tissue surrounding the defect is roughened, and is  inked black at time of gross. Serosa and attached fat have diffuse  exudate.  Specimen length: 21 cm  Mesorectal intactness: Not applicable  Tumor location: Circumferential mass in sigmoid.  Tumor size: 3 cm in length and 3 cm in width tan-red firm centrally  ulcerated sessile mass, with the transmural defect involving the central  ulcerated area.  Percent of bowel circumference involved: 100%, with mass stenosing the  lumen to less than 1 cm.  Tumor distance to margins:            Margins: 5 and 13 cm from the mass.            Mesenteric (sigmoid and transverse): Transmural  defect involving mesenteric margin, contiguous with mucosal mass.  Macroscopic extent of tumor invasion: Mass involves full-thickness of  the wall, invading into underlying fat, with possible involvement of  mesenteric fat at transmural defect inked outer surface.  Total presumed lymph nodes: On initial search, 8 possible lymph nodes  are found. The fat is placed in clearing solution, and 2 additional  possible nodes are submitted following fat clearing. Total of 10  possible nodes.  Extramural satellite tumor nodules: None  Mucosal polyp(s): None  Additional findings: None  Block summary:  Block 1 = margin nearest mass  Block 2 = margin furthest from mass  Block 3 = mass  Blocks 4, 5 = transmural defect involving mass  Block 6 = mass and adjacent mucosa  Block 7 = tissue for molecular testing  Block 8 = 4 possible nodes  Block 9 = 4 possible nodes  Block 10 = 2 possible nodes found after fat clearing   SW 04/30/2020   Final Diagnosis performed by Thressa Sheller, MD.  Electronically signed  05/01/2020  Assessment and Plan:  1.  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  Microcytic anemia due to iron deficiency 3.  Thrombocytosis  -Reviewed imaging findings and surgical pathology with the patient and her boyfriend.  CEA has been ordered and is pending.  Recommend CT of the chest to evaluate for distant metastasis.  Also recommend liver biopsy to confirm if liver lesions are metastatic disease.  Once we complete work-up, will discuss treatment options with the patient and her boyfriend. -The patient has microcytic anemia consistent with iron deficiency.  Status post Feraheme 510 mg IV on 04/30/2020.  Recommend a repeat dose of Feraheme 510 mg IV in 1 week. -Thrombocytosis likely reactive due to iron deficiency.  Monitor for now.   Thank you for this referral.   Mikey Bussing, DNP, AGPCNP-BC, AOCNP  Addendum  I have seen the patient, examined her. I agree with the assessment and and plan and have edited the notes.   Ms Susan Martin is a 60 year old Caucasian female, with past medical history of hypertension, depression and anemia, presented with bowel perforation from sigmoid colon cancer.  She underwent sigmoid colectomy with end colostomy.  I reviewed her surgical pathology findings, and her staging CT image with patient and her fianc Don in detail.  Unfortunately CT scan showed numerous liver lesions, which is highly suspicious for metastatic cancer.  I recommend liver biopsy, to confirm metastasis, and further molecular testing. She needs CT chest to complete staging. I reviewed overall prognosis of metastatic colon cancer, and treatment options with patient and [name]. I recommend chemotherapy when she recovers from surgery.  She lives in [place], would like to be seen at any pain cancer center.  I will set up her follow-up with Dr. Roselyn Bering in 2-3 weeks. All questions were answered.   Truitt Merle  05/02/2020

## 2020-05-01 NOTE — Consult Note (Signed)
WOC Nurse Consult Note: Patient receiving care in [facility] 5N18.  PAs to bedside for wound and stoma assessment. Reason for Consult: VAC change to abdominal wound Wound type: surgical Pressure Injury POA: Yes/No/NA Measurement: 11.2 cm x 3 cm x 1.7 cm Wound bed: pink Drainage (amount, consistency, odor) none observed Periwound: intact Dressing procedure/placement/frequency: 1 piece of black foam removed from wound bed, one piece place; drape applied, immediate seal obtained. Patient tolerated well.  [place] Nurse ostomy follow up Patient's sister to room for ostomy teaching session and pouch change. Stoma type/location: LUQ colostomy Stomal assessment/size: 1 5/8 inches, round, pink, budded Peristomal assessment: intact Treatment options for stomal/peristomal skin: none needed at this time Output: none at this time  Ostomy pouching: 2pc. Flat 2 and 3/4 inches  Education provided: Complete pouch change process, cleansing of peristomal area, sizing, cutting skin barrier, application of pouch, opening/closing pouch, cleaning pouch after emptying.  I also reviewed the materials in the [facility] education brochure.  Patient and her sister were able to open/close pouch. Sister applied new pouch to skin barrier.  Sister has agreed to return this Friday at 1 pm for another teaching session.  Additional supplies in room. Enrolled patient in [place] Start Discharge program: Yes on 11/2. Val Riles, RN, MSN, CWOCN, CNS-BC, pager [phone]

## 2020-05-01 NOTE — Evaluation (Addendum)
Physical Therapy Evaluation & Discharge Patient Details Name: Susan Martin MRN: [id number] DOB: 15-Feb-1961 Today's Date: 05/01/2020   History of Present Illness  Pt is a 59 y.o. female admitted 04/28/20 with abdominal pain, diarrhea and constipation. Workup for perforated colon s/p sigmoid colectomy with end colostomy 11/1. Pathology results revealed adenocarcinoma of colon. PMH includes anemia, depression, HTN.    Clinical Impression  Patient evaluated by Physical Therapy with no further acute PT needs identified. PTA, pt independent, lives with significant other. Today, pt mobilizing well with supervision for safety/lines. Educ re: abdominal precautions for comfort, positioning, activity recommendations, and importance of mobility. Sister present and supportive. All education has been completed and the patient has no further questions. Encouraged more frequent mobility during hospital admission. Acute PT is signing off. Thank you for this referral.    Follow Up Recommendations No PT follow up;Supervision - Intermittent    Equipment Recommendations  None recommended by PT    Recommendations for Other Services       Precautions / Restrictions Precautions Precautions: Fall;Other (comment) Precaution Comments: JP drain, wound vac, colostomy; abdominal precautions for comfort Restrictions Weight Bearing Restrictions: No      Mobility  Bed Mobility Overal bed mobility: Modified Independent             General bed mobility comments: Able to perform log roll for supine<>sit, use of bed rail; mod indep    Transfers Overall transfer level: Needs assistance Equipment used: None Transfers: Sit to/from Stand Sit to Stand: Modified independent (Device/Increase time)         General transfer comment: Guarded mobility secondary to abdominal pain; mod indep for increased time standing from EOB and low toilet height  Ambulation/Gait Ambulation/Gait assistance: Supervision Gait  Distance (Feet): 120 Feet Assistive device: None;IV Pole Gait Pattern/deviations: Step-through pattern;Decreased stride length;Trunk flexed Gait velocity: Decreased   General Gait Details: Slow, guarded gait with and without pushing IV pole, supervision for safety/lines; 1x self-corrected instability. Sister present to observe mobility  Stairs Stairs:  (Declined secondary to pain; educ on safe technique with rail support and family close to guard)          Wheelchair Mobility    Modified Rankin (Stroke Patients Only)       Balance Overall balance assessment: Modified Independent                                           Pertinent Vitals/Pain Pain Assessment: Faces Faces Pain Scale: Hurts little more Pain Location: Abdomen Pain Descriptors / Indicators: Operative site guarding;Discomfort Pain Intervention(s): Monitored during session;Repositioned;Patient requesting pain meds-RN notified    Home Living Family/patient expects to be discharged to:: Private residence Living Arrangements: Spouse/significant other Available Help at Discharge: Family;Available 24 hours/day Type of Home: House Home Access: Stairs to enter Entrance Stairs-Rails: Right Entrance Stairs-Number of Steps: 4 Home Layout: One level Home Equipment: Environmental consultant - 2 wheels Additional Comments: Lives with boyfriend who works. Sister also available so pt will have 24/7 help if needed. Sister trained on colostomy care this morning with wound care nurse    Prior Function Level of Independence: Independent               Hand Dominance        Extremity/Trunk Assessment   Upper Extremity Assessment Upper Extremity Assessment: Overall WFL for tasks assessed    Lower Extremity Assessment Lower Extremity  Assessment: Overall WFL for tasks assessed    Cervical / Trunk Assessment Cervical / Trunk Assessment:  (s/p colostomy; able to tolerate laying flat)  Communication    Communication: HOH  Cognition Arousal/Alertness: Awake/alert Behavior During Therapy: WFL for tasks assessed/performed Overall Cognitive Status: Within Functional Limits for tasks assessed                                        General Comments General comments (skin integrity, edema, etc.): Discussed use of RW versus no DME (pt has been using RW with staff, but moving well today without); pt ultimately feels more confident with walker and sister able to provide one for home use    Exercises     Assessment/Plan    PT Assessment Patent does not need any further PT services  PT Problem List         PT Treatment Interventions      PT Goals (Current goals can be found in the Care Plan section)  Acute Rehab PT Goals PT Goal Formulation: All assessment and education complete, DC therapy    Frequency     Barriers to discharge        Co-evaluation               AM-PAC PT "6 Clicks" Mobility  Outcome Measure Help needed turning from your back to your side while in a flat bed without using bedrails?: None Help needed moving from lying on your back to sitting on the side of a flat bed without using bedrails?: None Help needed moving to and from a bed to a chair (including a wheelchair)?: None Help needed standing up from a chair using your arms (e.g., wheelchair or bedside chair)?: None Help needed to walk in hospital room?: None Help needed climbing 3-5 steps with a railing? : A Little 6 Click Score: 23    End of Session   Activity Tolerance: Patient tolerated treatment well Patient left: in bed;with call bell/phone within reach;with family/visitor present Nurse Communication: Mobility status PT Visit Diagnosis: Other abnormalities of gait and mobility (R26.89)    Time: 4163-8453 PT Time Calculation (min) (ACUTE ONLY): 20 min   Charges:   PT Evaluation $PT Eval Moderate Complexity: [place], PT, DPT Acute Rehabilitation Services   Pager [phone] Office [place] 05/01/2020, 2:22 PM

## 2020-05-01 NOTE — Progress Notes (Signed)
Pathology results called back to Dr. Kieth Brightly and relayed to me.  Patient has adenocarcinoma of her colon.  She has a + mesenteric margin per his report.  Unclear yet as report has not been posted if she has + LNs.  Her CT scan on admission reveals numerous low-density masses throughout the liver which are certainly now concerning for metastatic disease given pathology report.  This information was discussed with the patient and her sister at the bedside.  Dr. Kieth Brightly has reached out to Dr. Burr Medico to work on arranging oncologic follow up.  All questions were welcomed and answered.  Susan Martin 11:28 AM 05/01/2020

## 2020-05-01 NOTE — Evaluation (Signed)
Occupational Therapy Evaluation & Discharge Patient Details Name: Susan Martin MRN: [id number] DOB: 11-30-1960 Today's Date: 05/01/2020    History of Present Illness Pt is a 59 y.o. female admitted 04/28/20 with abdominal pain, diarrhea and constipation. Workup for perforated colon s/p sigmoid colectomy with end colostomy 11/1. Pathology results revealed adenocarcinoma of colon. PMH includes anemia, depression, HTN.   Clinical Impression   PTA,pt lives with boyfriend and reports Independence in all daily tasks without DME use. Pt presents with pain from procedure noted above, but pleasant and agreeable to participate. With increased activity, pt steadiness improved to Modified Independent during session. Pt requires Setup for ADLs, min guard for tub transfer simulation to ensure safety. Pt reports boyfriend and sister able to provide 24/7 assistance with ADLs/IADLs while pt recovers. Educated on compensatory strategies to minimize pain for LB ADLs, use of DME in shower, and energy conservation. Pt ans sister verbalized understanding of all education. No further skilled OT services needed at this time. OT to sign off.     Follow Up Recommendations  No OT follow up;Supervision - Intermittent (assistance for tub transfers)    Equipment Recommendations  3 in 1 bedside commode    Recommendations for Other Services       Precautions / Restrictions Precautions Precautions: Fall;Other (comment) Precaution Comments: JP drain, wound vac, colostomy; abdominal precautions for comfort Restrictions Weight Bearing Restrictions: No      Mobility Bed Mobility Overal bed mobility: Modified Independent             General bed mobility comments: Able to perform log roll for supine<>sit, use of bed rail; mod indep    Transfers Overall transfer level: Needs assistance Equipment used: None Transfers: Sit to/from Stand Sit to Stand: Modified independent (Device/Increase time)          General transfer comment: Supervision initially secondary to minor LOB increasing to Modified Independent    Balance Overall balance assessment: Modified Independent                                         ADL either performed or assessed with clinical judgement   ADL Overall ADL's : Needs assistance/impaired Eating/Feeding: Independent;Sitting   Grooming: Set up;Standing   Upper Body Bathing: Independent;Sitting   Lower Body Bathing: Sit to/from stand;Set up   Upper Body Dressing : Set up;Sitting   Lower Body Dressing: Set up;Sit to/from stand   Toilet Transfer: Supervision/safety;Ambulation;Grab bars Toilet Transfer Details (indicate cue type and reason): Supervision, one mild lateral LOB but able to self correct, reaching out for support Toileting- Clothing Manipulation and Hygiene: Set up;Sit to/from stand Toileting - Clothing Manipulation Details (indicate cue type and reason): Setup, no assist needed Tub/ Shower Transfer: Min guard;Ambulation;Grab bars Tub/Shower Transfer Details (indicate cue type and reason): min guard for trial of various stepping over tub methods, use of grab. Pt reports plan to have family with her before tub transfers. Educated to use BSC in shower if needed  Functional mobility during ADLs: Supervision/safety General ADL Comments: mild limitations in strength and endurance     Vision Baseline Vision/History: No visual deficits Vision Assessment?: No apparent visual deficits     Perception     Praxis      Pertinent Vitals/Pain Pain Assessment: 0-10 Pain Score: 10-Worst pain ever Faces Pain Scale: Hurts little more Pain Location: Abdomen Pain Descriptors / Indicators: Operative site guarding;Discomfort Pain Intervention(s):  Limited activity within patient's tolerance;Monitored during session;Patient requesting pain meds-RN notified     Hand Dominance Right   Extremity/Trunk Assessment Upper Extremity Assessment Upper  Extremity Assessment: Generalized weakness   Lower Extremity Assessment Lower Extremity Assessment: Defer to PT evaluation   Cervical / Trunk Assessment Cervical / Trunk Assessment: Other exceptions (s/p colostomy)   Communication Communication Communication: HOH   Cognition Arousal/Alertness: Awake/alert Behavior During Therapy: WFL for tasks assessed/performed Overall Cognitive Status: Within Functional Limits for tasks assessed                                     General Comments  Discussed use of RW versus no DME with pt's sister having access to RW if needed. Educated on energy conservation, techniques to ease pain for LB Dressing using figure four position and use of BSC vs shower chair in tub    Exercises     Shoulder Instructions      Home Living Family/patient expects to be discharged to:: Private residence Living Arrangements: Spouse/significant other Available Help at Discharge: Family;Available 24 hours/day Type of Home: House Home Access: Stairs to enter [employer] of Steps: 4 Entrance Stairs-Rails: Right Home Layout: One level     Bathroom Shower/Tub: Teacher, early years/pre: Standard     Home Equipment: Environmental consultant - 2 wheels;Grab bars - tub/shower   Additional Comments: Lives with boyfriend who works. Sister also available so pt will have 24/7 help if needed. Sister trained on colostomy care this morning with wound care nurse      Prior Functioning/Environment Level of Independence: Independent        Comments: Independent in all daily tasks. Enjoys yard work and cross Engineer, manufacturing Problem List:        OT Treatment/Interventions:      OT Goals(Current goals can be found in the care plan section) Acute Rehab OT Goals Patient Stated Goal: pain control, rest, return to independence  OT Frequency:     Barriers to D/C:            Co-evaluation              AM-PAC OT "6 Clicks" Daily Activity      Outcome Measure Help from another person eating meals?: None Help from another person taking care of personal grooming?: A Little Help from another person toileting, which includes using toliet, bedpan, or urinal?: A Little Help from another person bathing (including washing, rinsing, drying)?: A Little Help from another person to put on and taking off regular upper body clothing?: A Little Help from another person to put on and taking off regular lower body clothing?: A Little 6 Click Score: 19   End of Session Nurse Communication: Mobility status;Patient requests pain meds  Activity Tolerance: Patient tolerated treatment well Patient left: in bed;with call bell/phone within reach;with family/visitor present                   Time: 0086-7619 OT Time Calculation (min): 17 min Charges:  OT General Charges $OT Visit: 1 Visit OT Evaluation $OT Eval Moderate Complexity: 1 Mod  Layla Maw, OTR/L  Layla Maw 05/01/2020, 2:33 PM

## 2020-05-01 NOTE — TOC Initial Note (Signed)
Transition of Care [facility]) - Initial/Assessment Note    Patient Details  Name: Susan Martin MRN: [id number] Date of Birth: 1960-10-03  Transition of [facility]) CM/SW Contact:    Curlene Labrum, RN Phone Number: 05/01/2020, 1:21 PM  Clinical Narrative:                 Case management met with the patient at the bedside with the sister regarding transitions of care to home S/P colostomy by Dr. Kieth Brightly.  The patient plans to discharge home to her own home with the assistance of family.  The patient does not have active insurance and is not working at this time.  The patient's discharge medications will need to be provided through [facility] and the sister is able to pay for the co-pay if needed - sister - Rodney Booze - [phone].  The patient's only primary care provider is a gynecology physician in [place] - so she was set up with a PCP follow up at the [facility] - listed in the discharge instructions.  Please provide the patient with needed colostomy and dressing supplies upon discharge home since the patient will not be discharged home with a wound vac according to [facility].  The patient plans to discharge home with her boyfriend - Hermina Barters.  Will continue to follow for discharge needs.   Expected Discharge Plan: [place] Barriers to Discharge: Inadequate or no insurance, Continued Medical Work up   Patient Goals and CMS Choice Patient states their goals for this hospitalization and ongoing recovery are:: Patient plans to discharge home with family. CMS Medicare.gov Compare Post Acute Care list provided to:: Patient Choice offered to / list presented to : Patient  Expected Discharge Plan and Services Expected Discharge Plan: [place] In-house Referral: PCP / [facility] Discharge Planning Services: CM Consult, [facility], Follow-up appt scheduled, Medication Assistance, Other - See comment  (Plan to provide simple dressing supplies for home upon discharge to home.) Post Acute Care Choice: [place] arrangements for the past 2 months: Single Family Home                 DME Arranged:  (dressing supplies to be provided for home.)         HH Arranged: RN HH Agency: [facility] [place] Date [facility] Agency Contacted: 05/01/20 Time HH Agency Contacted: 48 Representative spoke with at [place]: Meredeth Ide  Prior Living Arrangements/Services Living arrangements for the past 2 months: [place] with:: Significant Other Patient language and need for interpreter reviewed:: Yes Do you feel safe going back to the place where you live?: Yes      Need for Family Participation in Patient Care: Yes (Comment) Care giver support system in place?: Yes (comment)   Criminal Activity/Legal Involvement Pertinent to Current Situation/Hospitalization: No - Comment as needed  Activities of Daily Living Home Assistive Devices/Equipment: None ADL Screening (condition at time of admission) Patient's cognitive ability adequate to safely complete daily activities?: Yes Is the patient deaf or have difficulty hearing?: Yes Does the patient have difficulty seeing, even when wearing glasses/contacts?: No Does the patient have difficulty concentrating, remembering, or making decisions?: No Patient able to express need for assistance with ADLs?: Yes Does the patient have difficulty dressing or bathing?: No Independently performs ADLs?: Yes (appropriate for developmental age) Does the patient have difficulty walking or climbing stairs?: No Weakness of Legs: None Weakness of  Arms/Hands: None  Permission Sought/Granted Permission sought to share information with : Case Manager Permission granted to share information with : Yes, Verbal Permission Granted  Share Information with NAME: sister - Rodney Booze - [phone]  Permission granted to share info w AGENCY: [employer] [facility], [place] [facility]        Emotional Assessment Appearance:: Appears stated age Attitude/Demeanor/Rapport: Gracious Affect (typically observed): Accepting Orientation: : Oriented to Self, Oriented to Place, Oriented to  Time, Oriented to Situation Alcohol / Substance Use: Tobacco Use Psych Involvement: No (comment)  Admission diagnosis:  Status post surgery [Z98.890] Perforated diverticulum of large intestine [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 Status post surgery 04/29/2020  . Perforated diverticulum of large intestine 04/29/2020  . Postop check 05/14/2016  . 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 [place] [address] Phone: [phone] Fax: [phone]     Social Determinants of Health (SDOH) Interventions    Readmission Risk Interventions Readmission Risk Prevention Plan 05/01/2020  Post Dischage Appt Complete  Medication Screening Complete  Transportation Screening Complete  Some recent data might be hidden

## 2020-05-02 ENCOUNTER — Inpatient Hospital Stay (HOSPITAL_COMMUNITY): Payer: Medicaid Other

## 2020-05-02 DIAGNOSIS — C187 Malignant neoplasm of sigmoid colon: Principal | ICD-10-CM

## 2020-05-02 DIAGNOSIS — C787 Secondary malignant neoplasm of liver and intrahepatic bile duct: Secondary | ICD-10-CM

## 2020-05-02 LAB — BASIC METABOLIC PANEL
Anion gap: 8 (ref 5–15)
BUN: <5 mg/dL — ABNORMAL LOW (ref 6–20)
CO2: 27 mmol/L (ref 22–32)
Calcium: 8.4 mg/dL — ABNORMAL LOW (ref 8.9–10.3)
Chloride: 98 mmol/L (ref 98–111)
Creatinine, Ser: 0.61 mg/dL (ref 0.44–1.00)
GFR, Estimated: >60 mL/min (ref >60)
Glucose, Bld: 118 mg/dL — ABNORMAL HIGH (ref 70–99)
Potassium: 4.2 mmol/L (ref 3.5–5.1)
Sodium: 133 mmol/L — ABNORMAL LOW (ref 135–145)

## 2020-05-02 LAB — CBC
HCT: 28.4 % — ABNORMAL LOW (ref 36.0–46.0)
Hemoglobin: 8.4 g/dL — ABNORMAL LOW (ref 12.0–15.0)
MCH: 20.2 pg — ABNORMAL LOW (ref 26.0–34.0)
MCHC: 29.6 g/dL — ABNORMAL LOW (ref 30.0–36.0)
MCV: 68.4 fL — ABNORMAL LOW (ref 80.0–100.0)
Platelets: 477 10*3/uL — ABNORMAL HIGH (ref 150–400)
RBC: 4.15 MIL/uL (ref 3.87–5.11)
RDW: 19.5 % — ABNORMAL HIGH (ref 11.5–15.5)
WBC: 12.5 10*3/uL — ABNORMAL HIGH (ref 4.0–10.5)
nRBC: 0.3 % — ABNORMAL HIGH (ref 0.0–0.2)

## 2020-05-02 MED ORDER — IOHEXOL 300 MG/ML  SOLN
75.0000 mL | Freq: Once | INTRAMUSCULAR | Status: AC | PRN
  Administered 2020-05-02: 75 mL via INTRAVENOUS

## 2020-05-02 MED ORDER — ADULT MULTIVITAMIN W/MINERALS CH
1.0000 | ORAL_TABLET | Freq: Every day | ORAL | Status: DC
  Administered 2020-05-02 – 2020-05-08 (×7): 1 via ORAL
  Filled 2020-05-02 (×6): qty 1

## 2020-05-02 MED ORDER — ENSURE ENLIVE PO LIQD
237.0000 mL | Freq: Two times a day (BID) | ORAL | Status: DC
  Administered 2020-05-02 – 2020-05-08 (×10): 237 mL via ORAL

## 2020-05-02 NOTE — Progress Notes (Signed)
Nutrition Follow-up  DOCUMENTATION CODES:   Not applicable  INTERVENTION:   -Magic cup TID with meals, each supplement provides 290 kcal and 9 grams of protein -Ensure Enlive po BID, each supplement provides 350 kcal and 20 grams of protein -MVI with minerals daily -D/c Boost Breeze po TID, each supplement provides 250 kcal and 9 grams of protein  NUTRITION DIAGNOSIS:   Increased nutrient needs related to post-op healing as evidenced by estimated needs.  Ongoing  GOAL:   Patient will meet greater than or equal to 90% of their needs  Progressing   MONITOR:   Diet advancement, Labs, Weight trends, Skin, I & O's  REASON FOR ASSESSMENT:   Malnutrition Screening Tool    ASSESSMENT:   59 yo female with 2 weeks of abdominal pain diarrhea and constipation. She initially thought it was related to her paxil but it did not improve. Pain is constant. Worse with movement. It is a dull ache. She has nausea and vomiting. She denies fevers  11/1- s/p Procedure:sigmoid colectomy with end colostomy 11/3- NGT d/c, advanced to clear liquid diet 11/4- advanced to full liquid diet  Reviewed I/O's: +980 ml x 24 hours and +5.9 L since admission  Drain output: 20 ml x 24 hours  Per general surgery notes, pathology notable for adenocarcinoma with suspicion for liver metastasis. Pt tolerated full liquid diet well. Pt with flatus, but no output via colostomy yet.  Attempted to speak with pt via call to hospital room phone, however, no answer.   Pt just advanced to full liquid diet this morning. No meal completion records available to assess at this time. Pt has Boost Breeze supplements ordered- RD will trial Ensure Enlive due to diet advancement and increased nutritional density.   Oncology consult pending.   Medications reviewed and include lovenox and dextrose 5% and 0.45% NaCl with KCl 20 mEq/L infusion @ 75 ml/hr.   Labs reviewed: Na: 133.   Diet Order:   Diet Order             Diet full liquid Room service appropriate? Yes; Fluid consistency: Thin  Diet effective now                 EDUCATION NEEDS:   No education needs have been identified at this time  Skin:  Skin Assessment: Skin Integrity Issues: Skin Integrity Issues:: Wound VAC Wound Vac: abdomen  Last BM:  Unknown (with new colostomy- no output yet)  Height:   Ht Readings from Last 1 Encounters:  04/28/20 5\' 3"  (1.6 m)    Weight:   Wt Readings from Last 1 Encounters:  04/28/20 61.2 kg    Ideal Body Weight:  52.3 kg  BMI:  Body mass index is 23.91 kg/m.  Estimated Nutritional Needs:   Kcal:  1800-2000  Protein:  105-120 grams  Fluid:  > 1.8 L    Loistine Chance, RD, LDN, [place] Registered Dietitian II Certified Diabetes Care and Education Specialist Please refer to [facility] for RD and/or RD on-call/weekend/after hours pager

## 2020-05-02 NOTE — Progress Notes (Signed)
3 Days Post-Op  Subjective: Patient appropriately with sad affect today given news from yesterday.  Tolerated clear liquids well.  Still having some flatus from her colostomy but no output yet.  Mobilized with therapy yesterday who recommended no follow-up.  ROS: See above, otherwise other systems negative  Objective: Vital signs in last 24 hours: Temp:  [97.6 F (36.4 C)-98.5 F (36.9 C)] 97.9 F (36.6 C) (11/04 0800) Pulse Rate:  [71-79] 76 (11/04 0800) Resp:  [16-17] 17 (11/04 0426) BP: (158-175)/(72-81) 158/73 (11/04 0800) SpO2:  [95 %-100 %] 96 % (11/04 0800)    Intake/Output from previous day: 11/03 0701 - 11/04 0700 In: 1150 [I.V.:1000; IV Piggyback:150] Out: 170 [Emesis/NG output:150; Drains:20] Intake/Output this shift: Total I/O In: 120 [P.O.:120] Out: -   PE: Heart: Regular Lungs: Clear to auscultation bilaterally Abdomen: Soft, appropriately tender, midline VAC in place, JP drain in place with serous output, left upper quadrant colostomy with pink and viable stoma.  Some air in the bag but no feculent output currently.  Lab Results:  Recent Labs    05/01/20 0156 05/02/20 0551  WBC 9.7 12.5*  HGB 7.2* 8.4*  HCT 24.5* 28.4*  PLT 419* 477*   BMET Recent Labs    05/01/20 0156 05/02/20 0551  NA 135 133*  K 3.4* 4.2  CL 100 98  CO2 26 27  GLUCOSE 105* 118*  BUN 13 <5*  CREATININE 0.69 0.61  CALCIUM 8.1* 8.4*   PT/INR No results for input(s): LABPROT, INR in the last 72 hours. CMP     Component Value Date/Time   NA 133 (L) 05/02/2020 0551   NA 140 03/05/2016 1201   K 4.2 05/02/2020 0551   CL 98 05/02/2020 0551   CO2 27 05/02/2020 0551   GLUCOSE 118 (H) 05/02/2020 0551   BUN <5 (L) 05/02/2020 0551   BUN 10 03/05/2016 1201   CREATININE 0.61 05/02/2020 0551   CALCIUM 8.4 (L) 05/02/2020 0551   PROT 7.6 04/28/2020 1950   PROT 7.4 03/05/2016 1201   ALBUMIN 3.0 (L) 04/28/2020 1950   ALBUMIN 4.0 03/05/2016 1201   AST 24 04/28/2020 1950    ALT 13 04/28/2020 1950   ALKPHOS 102 04/28/2020 1950   BILITOT 0.9 04/28/2020 1950   BILITOT 0.3 03/05/2016 1201   GFRNONAA >60 05/02/2020 0551   GFRAA >60 04/01/2016 0541   Lipase     Component Value Date/Time   LIPASE 13 04/28/2020 1950       Studies/Results: No results found.  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 Kieth Brightly 11/1 -follow WBC, up slightly but no overt signs of infection -some flatus in ostomy pouch but no feculent output yet.  Tolerating clear liquids so will advance to full liquids today. -PT/OT for mobilization but recommend no follow-up at discharge.  Sister is going to help her at home as well as BF when he isn't at work. -  cont JP drain for now, but pending this remains serous can be removed prior to discharge. -pulm toilet/IS -mobilize -Dr. Burr Medico has been made aware of the patient and will see the patient. -pathology: - Adenocarcinoma, moderately differentiated, 3 cm  - Lymphovascular space and perineural invasion present  - No carcinoma identified in eight lymph nodes (0/8)  - One tumor deposit present  -  T4   FEN -full liquid diet, IV fluids VTE -lovenox  ID -zosyn x 5 days then stop   LOS: 3 days    Henreitta Cea , [facility] Surgery 05/02/2020, 8:54 AM Please see Amion for pager number during day hours 7:00am-4:30pm or 7:00am -11:30am on weekends

## 2020-05-02 NOTE — Progress Notes (Signed)
Patient arrived to unit transferred from 5N. Report received from [place], [place]. Patient alert and oriented x4. Skin assessed with CN L[place], RN. Wound vac in place, incision site clean and intact. Colostomy in place. Patient resting. No complaints. Will continue to monitor.

## 2020-05-03 ENCOUNTER — Inpatient Hospital Stay (HOSPITAL_COMMUNITY): Payer: Medicaid Other

## 2020-05-03 ENCOUNTER — Encounter (HOSPITAL_COMMUNITY): Payer: Self-pay

## 2020-05-03 ENCOUNTER — Other Ambulatory Visit: Payer: Self-pay | Admitting: Oncology

## 2020-05-03 LAB — CBC
HCT: 26.4 % — ABNORMAL LOW (ref 36.0–46.0)
Hemoglobin: 7.8 g/dL — ABNORMAL LOW (ref 12.0–15.0)
MCH: 20.3 pg — ABNORMAL LOW (ref 26.0–34.0)
MCHC: 29.5 g/dL — ABNORMAL LOW (ref 30.0–36.0)
MCV: 68.8 fL — ABNORMAL LOW (ref 80.0–100.0)
Platelets: 438 10*3/uL — ABNORMAL HIGH (ref 150–400)
RBC: 3.84 MIL/uL — ABNORMAL LOW (ref 3.87–5.11)
RDW: 20.2 % — ABNORMAL HIGH (ref 11.5–15.5)
WBC: 14.4 10*3/uL — ABNORMAL HIGH (ref 4.0–10.5)
nRBC: 0.6 % — ABNORMAL HIGH (ref 0.0–0.2)

## 2020-05-03 LAB — PROTIME-INR
INR: 1 (ref 0.8–1.2)
Prothrombin Time: 13 seconds (ref 11.4–15.2)

## 2020-05-03 LAB — CEA: CEA: 60.2 ng/mL — ABNORMAL HIGH (ref 0.0–4.7)

## 2020-05-03 MED ORDER — FENTANYL CITRATE (PF) 250 MCG/5ML IJ SOLN
INTRAMUSCULAR | Status: AC
  Filled 2020-05-03: qty 5

## 2020-05-03 MED ORDER — DEXAMETHASONE SODIUM PHOSPHATE 10 MG/ML IJ SOLN
INTRAMUSCULAR | Status: AC
  Filled 2020-05-03: qty 1

## 2020-05-03 MED ORDER — GELATIN ABSORBABLE 12-7 MM EX MISC
CUTANEOUS | Status: AC
  Filled 2020-05-03: qty 1

## 2020-05-03 MED ORDER — ONDANSETRON HCL 4 MG/2ML IJ SOLN
INTRAMUSCULAR | Status: AC
  Filled 2020-05-03: qty 2

## 2020-05-03 MED ORDER — MIDAZOLAM HCL 2 MG/2ML IJ SOLN
INTRAMUSCULAR | Status: AC
  Filled 2020-05-03: qty 2

## 2020-05-03 MED ORDER — LIDOCAINE HCL (PF) 1 % IJ SOLN
INTRAMUSCULAR | Status: AC
  Filled 2020-05-03: qty 30

## 2020-05-03 MED ORDER — FENTANYL CITRATE (PF) 100 MCG/2ML IJ SOLN
INTRAMUSCULAR | Status: AC
  Filled 2020-05-03: qty 2

## 2020-05-03 MED ORDER — PROPOFOL 10 MG/ML IV BOLUS
INTRAVENOUS | Status: AC
  Filled 2020-05-03: qty 20

## 2020-05-03 MED ORDER — FENTANYL CITRATE (PF) 100 MCG/2ML IJ SOLN
INTRAMUSCULAR | Status: AC | PRN
Start: 2020-05-03 — End: 2020-05-03
  Administered 2020-05-03: 50 ug via INTRAVENOUS

## 2020-05-03 MED ORDER — PHENYLEPHRINE 40 MCG/ML (10ML) SYRINGE FOR IV PUSH (FOR BLOOD PRESSURE SUPPORT)
PREFILLED_SYRINGE | INTRAVENOUS | Status: AC
  Filled 2020-05-03: qty 10

## 2020-05-03 MED ORDER — MIDAZOLAM HCL 2 MG/2ML IJ SOLN
INTRAMUSCULAR | Status: AC | PRN
  Administered 2020-05-03: 1 mg via INTRAVENOUS

## 2020-05-03 NOTE — Progress Notes (Addendum)
4 Days Post-Op  Subjective: Woke up from sleep.  Doing ok.  Tolerating some of her full liquids.  Still no stool in her pouch, but more air today.  Oncology saw patient yesterday.  CT chest negative for definitive mets in chest.    ROS: See above, otherwise other systems negative  Objective: Vital signs in last 24 hours: Temp:  [97.8 F (36.6 C)-98.5 F (36.9 C)] 97.8 F (36.6 C) (11/05 0517) Pulse Rate:  [76-84] 84 (11/05 0517) Resp:  [17-18] 17 (11/05 0517) BP: (130-165)/(66-77) 155/73 (11/05 0517) SpO2:  [95 %-99 %] 95 % (11/05 0517)    Intake/Output from previous day: 11/04 0701 - 11/05 0700 In: 2003.8 [P.O.:1080; I.V.:773.8; IV Piggyback:150] Out: 5 [Drains:5] Intake/Output this shift: No intake/output data recorded.  PE: Abd: soft, some bloating, ostomy with some air, but no stool yet.  Stoma is pink and viable.  Midline VAC removed and wound is clean.  VAC to be replaced by WOC.  Drain with minimal serous output.  Lab Results:  Recent Labs    05/02/20 0551 05/03/20 0217  WBC 12.5* 14.4*  HGB 8.4* 7.8*  HCT 28.4* 26.4*  PLT 477* 438*   BMET Recent Labs    05/01/20 0156 05/02/20 0551  NA 135 133*  K 3.4* 4.2  CL 100 98  CO2 26 27  GLUCOSE 105* 118*  BUN 13 <5*  CREATININE 0.69 0.61  CALCIUM 8.1* 8.4*   PT/INR No results for input(s): LABPROT, INR in the last 72 hours. CMP     Component Value Date/Time   NA 133 (L) 05/02/2020 0551   NA 140 03/05/2016 1201   K 4.2 05/02/2020 0551   CL 98 05/02/2020 0551   CO2 27 05/02/2020 0551   GLUCOSE 118 (H) 05/02/2020 0551   BUN <5 (L) 05/02/2020 0551   BUN 10 03/05/2016 1201   CREATININE 0.61 05/02/2020 0551   CALCIUM 8.4 (L) 05/02/2020 0551   PROT 7.6 04/28/2020 1950   PROT 7.4 03/05/2016 1201   ALBUMIN 3.0 (L) 04/28/2020 1950   ALBUMIN 4.0 03/05/2016 1201   AST 24 04/28/2020 1950   ALT 13 04/28/2020 1950   ALKPHOS 102 04/28/2020 1950   BILITOT 0.9 04/28/2020 1950   BILITOT 0.3 03/05/2016 1201    GFRNONAA >60 05/02/2020 0551   GFRAA >60 04/01/2016 0541   Lipase     Component Value Date/Time   LIPASE 13 04/28/2020 1950       Studies/Results: CT CHEST W CONTRAST  Result Date: 05/02/2020 CLINICAL DATA:  Colorectal cancer staging evaluation EXAM: CT CHEST WITH CONTRAST TECHNIQUE: Multidetector CT imaging of the chest was performed during intravenous contrast administration. CONTRAST:  58mL OMNIPAQUE IOHEXOL 300 MG/ML  SOLN COMPARISON:  CT abdomen and pelvis 04/28/2020 FINDINGS: Cardiovascular: Heart size is normal. No pericardial effusion. Normal caliber thoracic aorta. Normal appearance of central pulmonary vasculature. Limited on venous phase assessment. Mediastinum/Nodes: Thoracic inlet structures are normal. No axillary lymphadenopathy. No thoracic inlet lymphadenopathy. No mediastinal lymphadenopathy. No hilar lymphadenopathy. Lungs/Pleura: Tiny pulmonary nodule on image 97 of series 5 measuring 2 mm in the RIGHT middle lobe. Basilar atelectasis.  Airways are patent. Upper Abdomen: Small amount of pneumoperitoneum over the liver diminished compared to the previous CT of the abdomen and pelvis in this patient now postoperative for resection of colonic mass/perforated colon cancer. Numerous hepatic lesions are noted which are unchanged compared to recent imaging. Findings are more suggestive of hepatic metastatic disease than abscess. Musculoskeletal: No acute musculoskeletal process.  Spinal degenerative changes. IMPRESSION: 1. No definite evidence of metastatic disease to the chest with tiny 2 mm RIGHT middle lobe pulmonary nodule. Attention on follow-up. 2. Small amount of pneumoperitoneum over the liver diminished compared to the previous CT of the abdomen and pelvis in this patient now postoperative for resection of colonic mass/perforated colon cancer. Numerous low-attenuation lesions in the liver are more compatible with hepatic metastatic disease rather than hepatic abscess. These are  stable compared to recent imaging. Ultrasound with tissue sampling could be helpful as warranted for further assessment. 3. Aortic atherosclerosis. Aortic Atherosclerosis (ICD10-I70.0). Electronically Signed   By: Zetta Bills M.D.   On: 05/02/2020 21:17    Anti-infectives: Anti-infectives (From admission, onward)   Start     Dose/Rate Route Frequency Ordered Stop   04/28/20 2130  piperacillin-tazobactam (ZOSYN) IVPB 3.375 g        3.375 g 12.5 mL/hr over 240 Minutes Intravenous Every 8 hours 04/28/20 2123         Assessment/Plan HTN -resume home med Iron deficiency anemia and likely anemia secondary to malignancy -s/p iron transfusion, per oncology will need another transfusion this upcoming week Depression - paxil Anxiety - prn xanax  POD4,s/p ex lap with Hartman's procedure for perforated adenocarcinoma of colon, possible liver mets on CT scan, Dr. Kieth Brightly 11/1 -follow WBC, up slightly but no overt signs of infection -some flatus in ostomy pouch but no feculent output yet.  Tolerating fulls but some distention.  Will leave on fulls today. -PT/OT for mobilization but recommend no follow-up at discharge. Sister is going to help her at home as well as BF when he isn't at work. -cont JP drain for now, but pending this remains serous can be removed prior to discharge. -pulm toilet/IS -mobilize -appreciate oncology assistance.  Patient had her staging CT chest.  Plan for US liver biospy by IR.  Patient will follow up with Dr. Roselyn Bering at [facility] cancer center at discharge. -plan to [place] at discharge as she has no insurance and unable to get [facility] for VAC change -pathology: - Adenocarcinoma, moderately differentiated, 3 cm  - Lymphovascular space and perineural invasion present  - No carcinoma identified in eight lymph nodes (0/8)  - One tumor deposit present  -  T4   FEN -full liquid diet, IV fluids VTE -lovenox  ID -zosyn x 5 days then stop unless WBC continues to  increase   LOS: 4 days    Henreitta Cea , [facility] Surgery 05/03/2020, 8:23 AM Please see Amion for pager number during day hours 7:00am-4:30pm or 7:00am -11:30am on weekends

## 2020-05-03 NOTE — Consult Note (Signed)
[facility] Nurse wound follow up Patient receiving care in [place]. Wound type: surgical abdominal wound Measurement: na Wound bed: pink Drainage (amount, consistency, odor) serous in cannister Periwound: intact Dressing procedure/placement/frequency: one small piece of black foam placed into wound, drape applied, immediate seal obtained. Patient tolerated well. Val Riles, RN, MSN, CWOCN, CNS-BC, pager [phone]

## 2020-05-03 NOTE — Consult Note (Signed)
Chief Complaint: Patient was seen in consultation today for liver lesion  Referring Physician(s): Dr. Burr Medico  Supervising Physician: Daryll Brod  Patient Status: [facility] - In-pt  History of Present Illness: Susan Martin is a 59 y.o. female with past medical history of anemia, anxiety/depression, HTN who presented to the [facility] ED with abdominal pain.  She was found to have perforated adenocarcinoma of the colon now POD 4 s/p Hartmann's procedure.  CT Abdomen Pelvis with liver lesions concerning for metastasis.   Past Medical History:  Diagnosis Date  . Anemia   . Depression   . Hypertension     Past Surgical History:  Procedure Laterality Date  . ABDOMINAL HYSTERECTOMY    . APPLICATION OF WOUND VAC N/A 04/29/2020   Procedure: APPLICATION OF WOUND VAC;  Surgeon: Kinsinger, Arta Bruce, MD;  Location: [place];  Service: General;  Laterality: N/A;  . ENDOMETRIAL ABLATION    . LAPAROTOMY N/A 04/29/2020   Procedure: EXPLORATORY LAPAROTOMY;  Surgeon: Kieth Brightly Arta Bruce, MD;  Location: [place];  Service: General;  Laterality: N/A;  . PARTIAL COLECTOMY N/A 04/29/2020   Procedure: PARTIAL COLECTOMY WITH END COLOSTOMY;  Surgeon: Kieth Brightly Arta Bruce, MD;  Location: [place];  Service: General;  Laterality: N/A;  . SALPINGOOPHORECTOMY Left 03/31/2016   Procedure: LEFT SALPINGO OOPHORECTOMY WITH FROZEN SECTION,  ABDOMINAL HYSTERECTOMY WITH BILATERAL SALPINGO-OOPHORECTOMY;  Surgeon: Jonnie Kind, MD;  Location: [facility];  Service: Gynecology;  Laterality: Left;    Allergies: Patient has no known allergies.  Medications: Prior to Admission medications   Medication Sig Start Date End Date Taking? Authorizing Provider  acetaminophen (TYLENOL) 325 MG tablet Take 650 mg by mouth every 6 (six) hours as needed for mild pain, fever or headache.   Yes [provider]  fluticasone (FLONASE) 50 MCG/ACT nasal spray Place 1-2 sprays into both nostrils daily as needed for allergies or rhinitis.   Yes  [provider]  PARoxetine (PAXIL) 40 MG tablet Take 1 tablet (40 mg total) by mouth every morning. 11/01/19  Yes Jonnie Kind, MD  polyethylene glycol [facility] / Floria Raveling) packet Take 17 g by mouth every other day.   Yes [provider]  triamterene-hydrochlorothiazide (DYAZIDE) 37.5-25 MG capsule Take 1 each (1 capsule total) by mouth daily. 11/01/19  Yes Jonnie Kind, MD  estradiol (ESTRACE) 1 MG tablet Take 1 tablet (1 mg total) daily by mouth. Patient not taking: Reported on 04/29/2020 05/14/17 05/14/18  Jonnie Kind, MD     Family History  Problem Relation Age of Onset  . Hypertension Mother   . Diabetes Mother   . Cirrhosis Father     Social History   Socioeconomic History  . Marital status: Divorced    Spouse name: Not on file  . Number of children: Not on file  . Years of education: Not on file  . Highest education level: Not on file  Occupational History  . Not on file  Tobacco Use  . Smoking status: Former Smoker    Packs/day: 0.50    Years: 5.00    Pack years: 2.50    Types: Cigarettes    Quit date: 03/28/2015    Years since quitting: 5.1  . Smokeless tobacco: Never Used  Substance and Sexual Activity  . Alcohol use: No  . Drug use: No  . Sexual activity: Yes    Partners: Male    Birth control/protection: None  Other Topics Concern  . Not on file  Social History Narrative  .  Not on file   Social Determinants of Health   Financial Resource Strain: Low Risk   . Difficulty of Paying Living Expenses: Not very hard  Food Insecurity: No Food Insecurity  . Worried About Charity fundraiser in the Last Year: Never true  . Ran Out of Food in the Last Year: Never true  Transportation Needs: No Transportation Needs  . Lack of Transportation (Medical): No  . Lack of Transportation (Non-Medical): No  Physical Activity: Insufficiently Active  . Days of Exercise per Week: 2 days  . Minutes of Exercise per Session: 30 min  Stress: Stress  Concern Present  . Feeling of Stress : To some extent  Social Connections: Moderately Integrated  . Frequency of Communication with Friends and Family: More than three times a week  . Frequency of Social Gatherings with Friends and Family: Once a week  . Attends Religious Services: More than 4 times per year  . Active Member of Clubs or Organizations: No  . Attends Archivist Meetings: Never  . Marital Status: Living with partner     Review of Systems: A 12 point ROS discussed and pertinent positives are indicated in the HPI above.  All other systems are negative.  Review of Systems  Constitutional: Negative for fatigue and fever.  Respiratory: Negative for cough and shortness of breath.   Cardiovascular: Negative for chest pain.  Gastrointestinal: Positive for abdominal pain. Negative for diarrhea, nausea and vomiting.  Musculoskeletal: Negative for back pain.  Psychiatric/Behavioral: Negative for behavioral problems and confusion.    Vital Signs: BP (!) 160/78 (BP Location: Right Arm)   Pulse 79   Temp 98 F (36.7 C) (Oral)   Resp 18   Ht 5\' 3"  (1.6 m)   Wt 135 lb (61.2 kg)   SpO2 97%   BMI 23.91 kg/m   Physical Exam Vitals and nursing note reviewed.  Constitutional:      General: She is not in acute distress.    Appearance: She is well-developed. She is not ill-appearing.  Cardiovascular:     Rate and Rhythm: Normal rate and regular rhythm.  Pulmonary:     Effort: Pulmonary effort is normal. No respiratory distress.     Breath sounds: Normal breath sounds.  Abdominal:     Palpations: Abdomen is soft.     Tenderness: There is generalized abdominal tenderness.     Comments: Midline wound vac Colostomy in place   Skin:    General: Skin is warm and dry.  Neurological:     General: No focal deficit present.     Mental Status: She is alert and oriented to person, place, and time.  Psychiatric:        Mood and Affect: Mood normal.        Behavior:  Behavior normal.      MD Evaluation Airway: WNL Heart: WNL Abdomen: WNL Chest/ Lungs: WNL ASA  Classification: 3 Mallampati/Airway Score: One   Imaging: CT CHEST W CONTRAST  Result Date: 05/02/2020 CLINICAL DATA:  Colorectal cancer staging evaluation EXAM: CT CHEST WITH CONTRAST TECHNIQUE: Multidetector CT imaging of the chest was performed during intravenous contrast administration. CONTRAST:  19mL OMNIPAQUE IOHEXOL 300 MG/ML  SOLN COMPARISON:  CT abdomen and pelvis 04/28/2020 FINDINGS: Cardiovascular: Heart size is normal. No pericardial effusion. Normal caliber thoracic aorta. Normal appearance of central pulmonary vasculature. Limited on venous phase assessment. Mediastinum/Nodes: Thoracic inlet structures are normal. No axillary lymphadenopathy. No thoracic inlet lymphadenopathy. No mediastinal lymphadenopathy. No hilar lymphadenopathy.  Lungs/Pleura: Tiny pulmonary nodule on image 97 of series 5 measuring 2 mm in the RIGHT middle lobe. Basilar atelectasis.  Airways are patent. Upper Abdomen: Small amount of pneumoperitoneum over the liver diminished compared to the previous CT of the abdomen and pelvis in this patient now postoperative for resection of colonic mass/perforated colon cancer. Numerous hepatic lesions are noted which are unchanged compared to recent imaging. Findings are more suggestive of hepatic metastatic disease than abscess. Musculoskeletal: No acute musculoskeletal process. Spinal degenerative changes. IMPRESSION: 1. No definite evidence of metastatic disease to the chest with tiny 2 mm RIGHT middle lobe pulmonary nodule. Attention on follow-up. 2. Small amount of pneumoperitoneum over the liver diminished compared to the previous CT of the abdomen and pelvis in this patient now postoperative for resection of colonic mass/perforated colon cancer. Numerous low-attenuation lesions in the liver are more compatible with hepatic metastatic disease rather than hepatic abscess.  These are stable compared to recent imaging. Ultrasound with tissue sampling could be helpful as warranted for further assessment. 3. Aortic atherosclerosis. Aortic Atherosclerosis (ICD10-I70.0). Electronically Signed   By: Zetta Bills M.D.   On: 05/02/2020 21:17   CT ABDOMEN PELVIS W CONTRAST  Result Date: 04/28/2020 CLINICAL DATA:  Abdominal pain, fever EXAM: CT ABDOMEN AND PELVIS WITH CONTRAST TECHNIQUE: Multidetector CT imaging of the abdomen and pelvis was performed using the standard protocol following bolus administration of intravenous contrast. CONTRAST:  55mL OMNIPAQUE IOHEXOL 300 MG/ML  SOLN COMPARISON:  03/09/2016 FINDINGS: Lower chest: No acute abnormality. Hepatobiliary: Numerous low-density masses throughout the liver are new since prior study. Index measurement of a right hepatic lesion on image 17 measures 3.7 cm in greatest diameter. These are concerning for metastases. Gallbladder unremarkable. Pancreas: No focal abnormality or ductal dilatation. Spleen: No focal abnormality.  Normal size. Adrenals/Urinary Tract: No adrenal abnormality. No focal renal abnormality. No stones or hydronephrosis. Urinary bladder is unremarkable. Stomach/Bowel: Extensive inflammation noted around the sigmoid colon with locules of extraluminal gas throughout the abdomen and pelvis and large gas and fluid collection adjacent to the sigmoid colon compatible with abscess. Circumferential wall thickening seen within the mid sigmoid colon. Large stool burden throughout the colon. Stomach and small bowel decompressed, unremarkable. Vascular/Lymphatic: No evidence of aneurysm or adenopathy. Reproductive: Prior hysterectomy.  No adnexal masses. Other: Fluid and gas collection in the pelvis mentioned above measures 8.7 x 4.5 cm. Free air in the abdomen and pelvis. Musculoskeletal: No acute bony abnormality. IMPRESSION: Evidence of bowel perforation in the sigmoid colon. There is for pharyngeal wall thickening within the  mid sigmoid colon with surrounding extraluminal gas and fluid collections compatible with abscess is. Free air is also noted in the upper abdomen adjacent to the liver. This could reflect perforated diverticulitis or perforated cancer. Numerous low-density masses throughout the liver. Appearance is concerning for metastases. Hepatic abscesses cannot be completely excluded but felt less likely given the Sheer number. Large stool burden throughout the colon. These results were called by telephone at the time of interpretation on 04/28/2020 at 10:26 pm to provider MATTHEW TRIFAN , who verbally acknowledged these results. Electronically Signed   By: Rolm Baptise M.D.   On: 04/28/2020 22:29    Labs:  CBC: Recent Labs    04/30/20 0237 05/01/20 0156 05/02/20 0551 05/03/20 0217  WBC 10.4 9.7 12.5* 14.4*  HGB 7.2* 7.2* 8.4* 7.8*  HCT 23.9* 24.5* 28.4* 26.4*  PLT 434* 419* 477* 438*    COAGS: Recent Labs    04/28/20 1950  INR  1.1    BMP: Recent Labs    04/29/20 0856 04/30/20 0237 05/01/20 0156 05/02/20 0551  NA 137 135 135 133*  K 3.4* 3.4* 3.4* 4.2  CL 103 100 100 98  CO2 24 27 26 27   GLUCOSE 108* 130* 105* 118*  BUN 13 18 13  <5*  CALCIUM 7.9* 8.1* 8.1* 8.4*  CREATININE 0.80 0.82 0.69 0.61  GFRNONAA >60 >60 >60 >60    LIVER FUNCTION TESTS: Recent Labs    04/28/20 1950  BILITOT 0.9  AST 24  ALT 13  ALKPHOS 102  PROT 7.6  ALBUMIN 3.0*    TUMOR MARKERS: No results for input(s): AFPTM, CEA, CA199, CHROMGRNA in the last 8760 hours.  Assessment and Plan: Perforated adenocarcinoma of colon s/p Hartmann's procedure Concern for metastatic disease, liver mets on CT scan IR consulted for liver lesion biopsy at the request of Dr. Burr Medico.  Patient abdomen assessed.  Wound vac and colostomy are more midline. RUQ should be accessible for liver lesion biopsy.  Case reviewed by Dr. Annamaria Boots who approves patient for procedure. Patient NPO this AM.  Lovenox held. INR 1.0  Risks and  benefits of biopsy was discussed with the patient and/or patient's family including, but not limited to bleeding, infection, damage to adjacent structures or low yield requiring additional tests.  All of the questions were answered and there is agreement to proceed.  Consent signed and in chart.  Thank you for this interesting consult.  I greatly enjoyed meeting Rupal Childress and look forward to participating in their care.  A copy of this report was sent to the requesting provider on this date.  Electronically Signed: Docia Barrier, PA 05/03/2020, 11:00 AM   I spent a total of 40 Minutes    in face to face in clinical consultation, greater than 50% of which was counseling/coordinating care for liver lesions.

## 2020-05-03 NOTE — Procedures (Signed)
Interventional Radiology Procedure Note  Procedure: [place] LEFT LIVER MET BX    Complications: None  Estimated Blood Loss:  MIN  Findings: 38 G CORES X3    M. Daryll Brod, MD

## 2020-05-03 NOTE — Consult Note (Signed)
[place] Nurse ostomy follow up Patient receiving care in [place].  Patient's sister, Chong Sicilian, in for second ostomy teaching session. Stoma type/location: LUQ colostomy, moist, sutures intact, gas in pouch Stomal assessment/size: 1 5/8 inches Peristomal assessment: intact Treatment options for stomal/peristomal skin: none needed at this time Output: gas only  Ostomy pouching: 2pc. Flat, 2 and 3/4 inches Education provided: Patty performed the entire ostomy pouch removal and application process with very minimal guidance from [place].  I explained that I don't think Patty needs any further teaching sessions; however, the patient does. Enrolled patient in [place] Start Discharge program: Yes, previously and supplies have been received.  I have sent a Secure Start email to the [facility] enrollment personnel asking to have supplies that match what we are using be sent to the patient.  Everything she received is 2 and 1/4 inches and some are completely beige making it impossible for the patient to see into the pouch. Val Riles, RN, MSN, CWOCN, CNS-BC, pager [phone]

## 2020-05-04 LAB — BASIC METABOLIC PANEL
Anion gap: 8 (ref 5–15)
BUN: <5 mg/dL — ABNORMAL LOW (ref 6–20)
CO2: 28 mmol/L (ref 22–32)
Calcium: 8.5 mg/dL — ABNORMAL LOW (ref 8.9–10.3)
Chloride: 99 mmol/L (ref 98–111)
Creatinine, Ser: 0.64 mg/dL (ref 0.44–1.00)
GFR, Estimated: >60 mL/min (ref >60)
Glucose, Bld: 115 mg/dL — ABNORMAL HIGH (ref 70–99)
Potassium: 4.7 mmol/L (ref 3.5–5.1)
Sodium: 135 mmol/L (ref 135–145)

## 2020-05-04 LAB — SURGICAL PATHOLOGY

## 2020-05-04 LAB — CBC
HCT: 28.2 % — ABNORMAL LOW (ref 36.0–46.0)
Hemoglobin: 8.2 g/dL — ABNORMAL LOW (ref 12.0–15.0)
MCH: 20.2 pg — ABNORMAL LOW (ref 26.0–34.0)
MCHC: 29.1 g/dL — ABNORMAL LOW (ref 30.0–36.0)
MCV: 69.6 fL — ABNORMAL LOW (ref 80.0–100.0)
Platelets: 448 10*3/uL — ABNORMAL HIGH (ref 150–400)
RBC: 4.05 MIL/uL (ref 3.87–5.11)
RDW: 21.2 % — ABNORMAL HIGH (ref 11.5–15.5)
WBC: 11.1 10*3/uL — ABNORMAL HIGH (ref 4.0–10.5)
nRBC: 0.5 % — ABNORMAL HIGH (ref 0.0–0.2)

## 2020-05-04 NOTE — Progress Notes (Signed)
JP noted to have a purulent foul smelling drainage.  We will continue to monitor.

## 2020-05-04 NOTE — Progress Notes (Signed)
5 Days Post-Op  Subjective: Liver biopsy completed yesterday.  Some pain around biopsy site.  Returned to the room after biopsy and felt nauseated, she thinks she drank too much right after the procedure.  ROS: See above, otherwise other systems negative  Objective: Vital signs in last 24 hours: Temp:  [97.6 F (36.4 C)-98 F (36.7 C)] 97.8 F (36.6 C) (11/06 0455) Pulse Rate:  [72-81] 72 (11/06 0455) Resp:  [11-18] 16 (11/06 0455) BP: (145-172)/(69-90) 166/70 (11/06 0455) SpO2:  [95 %-100 %] 98 % (11/06 0455)    Intake/Output from previous day: 11/05 0701 - 11/06 0700 In: 1269.1 [P.O.:560; I.V.:534.5; IV Piggyback:174.6] Out: 60 [Drains:60] Intake/Output this shift: No intake/output data recorded.  PE: Abd: soft, some bloating, ostomy with no air or stool this morning.  Stoma is pink and viable.  Midline VAC removed and wound is clean.  VAC to be replaced by WOC.  Drain with minimal serous output.  Lab Results:  Recent Labs    05/03/20 0217 05/04/20 0157  WBC 14.4* 11.1*  HGB 7.8* 8.2*  HCT 26.4* 28.2*  PLT 438* 448*   BMET Recent Labs    05/02/20 0551 05/04/20 0157  NA 133* 135  K 4.2 4.7  CL 98 99  CO2 27 28  GLUCOSE 118* 115*  BUN <5* <5*  CREATININE 0.61 0.64  CALCIUM 8.4* 8.5*   PT/INR Recent Labs    05/03/20 0957  LABPROT 13.0  INR 1.0   CMP     Component Value Date/Time   NA 135 05/04/2020 0157   NA 140 03/05/2016 1201   K 4.7 05/04/2020 0157   CL 99 05/04/2020 0157   CO2 28 05/04/2020 0157   GLUCOSE 115 (H) 05/04/2020 0157   BUN <5 (L) 05/04/2020 0157   BUN 10 03/05/2016 1201   CREATININE 0.64 05/04/2020 0157   CALCIUM 8.5 (L) 05/04/2020 0157   PROT 7.6 04/28/2020 1950   PROT 7.4 03/05/2016 1201   ALBUMIN 3.0 (L) 04/28/2020 1950   ALBUMIN 4.0 03/05/2016 1201   AST 24 04/28/2020 1950   ALT 13 04/28/2020 1950   ALKPHOS 102 04/28/2020 1950   BILITOT 0.9 04/28/2020 1950   BILITOT 0.3 03/05/2016 1201   GFRNONAA >60 05/04/2020  0157   GFRAA >60 04/01/2016 0541   Lipase     Component Value Date/Time   LIPASE 13 04/28/2020 1950       Studies/Results: CT CHEST W CONTRAST  Result Date: 05/02/2020 CLINICAL DATA:  Colorectal cancer staging evaluation EXAM: CT CHEST WITH CONTRAST TECHNIQUE: Multidetector CT imaging of the chest was performed during intravenous contrast administration. CONTRAST:  105mL OMNIPAQUE IOHEXOL 300 MG/ML  SOLN COMPARISON:  CT abdomen and pelvis 04/28/2020 FINDINGS: Cardiovascular: Heart size is normal. No pericardial effusion. Normal caliber thoracic aorta. Normal appearance of central pulmonary vasculature. Limited on venous phase assessment. Mediastinum/Nodes: Thoracic inlet structures are normal. No axillary lymphadenopathy. No thoracic inlet lymphadenopathy. No mediastinal lymphadenopathy. No hilar lymphadenopathy. Lungs/Pleura: Tiny pulmonary nodule on image 97 of series 5 measuring 2 mm in the RIGHT middle lobe. Basilar atelectasis.  Airways are patent. Upper Abdomen: Small amount of pneumoperitoneum over the liver diminished compared to the previous CT of the abdomen and pelvis in this patient now postoperative for resection of colonic mass/perforated colon cancer. Numerous hepatic lesions are noted which are unchanged compared to recent imaging. Findings are more suggestive of hepatic metastatic disease than abscess. Musculoskeletal: No acute musculoskeletal process. Spinal degenerative changes. IMPRESSION: 1. No definite evidence  of metastatic disease to the chest with tiny 2 mm RIGHT middle lobe pulmonary nodule. Attention on follow-up. 2. Small amount of pneumoperitoneum over the liver diminished compared to the previous CT of the abdomen and pelvis in this patient now postoperative for resection of colonic mass/perforated colon cancer. Numerous low-attenuation lesions in the liver are more compatible with hepatic metastatic disease rather than hepatic abscess. These are stable compared to recent  imaging. Ultrasound with tissue sampling could be helpful as warranted for further assessment. 3. Aortic atherosclerosis. Aortic Atherosclerosis (ICD10-I70.0). Electronically Signed   By: Zetta Bills M.D.   On: 05/02/2020 21:17   US BIOPSY (LIVER)  Result Date: 05/03/2020 INDICATION: Liver metastases, suspect colon cancer EXAM: ULTRASOUND LEFT LIVER METASTASIS CORE BIOPSY MEDICATIONS: 1% LIDOCAINE LOCAL ANESTHESIA/SEDATION: Moderate (conscious) sedation was employed during this procedure. A total of Versed 1.0 mg and Fentanyl 50 mcg was administered intravenously. Moderate Sedation Time: 10 minutes. The patient's level of consciousness and vital signs were monitored continuously by radiology nursing throughout the procedure under my direct supervision. FLUOROSCOPY TIME:  Fluoroscopy Time: NONE. COMPLICATIONS: None immediate. PROCEDURE: Informed written consent was obtained from the patient after a thorough discussion of the procedural risks, benefits and alternatives. All questions were addressed. Maximal Sterile Barrier Technique was utilized including caps, mask, sterile gowns, sterile gloves, sterile drape, hand hygiene and skin antiseptic. A timeout was performed prior to the initiation of the procedure. Previous imaging reviewed. Preliminary ultrasound performed. A subtle hypoechoic lesion in the anterior left lobe was localized and marked in the epigastric region. Under sterile conditions and local anesthesia, the 17 gauge coaxial guide was advanced to the lesion. Needle position confirmed with ultrasound. 18 gauge core biopsies obtained (3). Samples were intact and non fragmented. Needle tract occluded with Gel-Foam. Postprocedure imaging demonstrates no hemorrhage or hematoma. Patient tolerated biopsy well. IMPRESSION: Successful ultrasound left hepatic metastasis 18 gauge core biopsies Electronically Signed   By: Jerilynn Mages.  Shick M.D.   On: 05/03/2020 13:22    Anti-infectives: Anti-infectives (From  admission, onward)   Start     Dose/Rate Route Frequency Ordered Stop   04/28/20 2130  piperacillin-tazobactam (ZOSYN) IVPB 3.375 g        3.375 g 12.5 mL/hr over 240 Minutes Intravenous Every 8 hours 04/28/20 2123         Assessment/Plan HTN -resume home med Iron deficiency anemia and likely anemia secondary to malignancy -s/p iron transfusion, per oncology will need another transfusion this upcoming week Depression - paxil Anxiety - prn xanax  POD5,s/p ex lap with Hartman's procedure for perforated adenocarcinoma of colon, possible liver mets on CT scan, Dr. Kieth Brightly 11/1 -Leukocytosis downtrending today -Has had some flatus in ostomy pouch but no feculent output yet.  Tolerating fulls but some distention and some nausea after the liver biopsy yesterday.  Will continue full liquid diet today -PT/OT for mobilization but recommend no follow-up at discharge. Sister is going to help her at home as well as BF when he isn't at work. -cont JP drain for now, but pending this remains serous can be removed prior to discharge.  Only 60 mL out yesterday -pulm toilet/IS -mobilize -appreciate oncology assistance.  Patient had her staging CT chest.  Liver biopsy completed yesterday, will follow up pathology report.  Patient will follow up with Dr. Roselyn Bering at [facility] cancer center at discharge. -plan to [place] at discharge as she has no insurance and unable to get [facility] for VAC change -pathology: - Adenocarcinoma, moderately differentiated, 3 cm  -  Lymphovascular space and perineural invasion present  - No carcinoma identified in eight lymph nodes (0/8)  - One tumor deposit present  -  T4   FEN -full liquid diet, IV fluids VTE -lovenox  ID -stop Zosyn   LOS: 5 days    [facility] 05/04/2020, 8:46 AM Please see Amion for pager number during day hours 7:00am-4:30pm or 7:00am -11:30am on weekends

## 2020-05-05 NOTE — Progress Notes (Signed)
6 Days Post-Op  Subjective: Pain is mostly around the liver biopsy site on the right upper abdomen.  Has been passing some gas through the ostomy, no stool yet.  Doing better with liquids yesterday, less nausea.  ROS: See above, otherwise other systems negative  Objective: Vital signs in last 24 hours: Temp:  [98.2 F (36.8 C)-98.6 F (37 C)] 98.2 F (36.8 C) (11/07 0835) Pulse Rate:  [73-86] 78 (11/07 0835) Resp:  [17-18] 18 (11/07 0835) BP: (153-171)/(76-89) 153/76 (11/07 0835) SpO2:  [97 %-100 %] 97 % (11/07 0835) Last BM Date: 05/03/20  Intake/Output from previous day: 11/06 0701 - 11/07 0700 In: 1060 [P.O.:960; IV Piggyback:100] Out: 0  Intake/Output this shift: No intake/output data recorded.  PE: Abd: soft, some bloating, ostomy with some air but no stool this morning.  Stoma is pink and viable.  Midline VAC removed and wound is clean.  VAC to be replaced by WOC.  Drain with minimal serous output.  Lab Results:  Recent Labs    05/03/20 0217 05/04/20 0157  WBC 14.4* 11.1*  HGB 7.8* 8.2*  HCT 26.4* 28.2*  PLT 438* 448*   BMET Recent Labs    05/04/20 0157  NA 135  K 4.7  CL 99  CO2 28  GLUCOSE 115*  BUN <5*  CREATININE 0.64  CALCIUM 8.5*   PT/INR Recent Labs    05/03/20 0957  LABPROT 13.0  INR 1.0   CMP     Component Value Date/Time   NA 135 05/04/2020 0157   NA 140 03/05/2016 1201   K 4.7 05/04/2020 0157   CL 99 05/04/2020 0157   CO2 28 05/04/2020 0157   GLUCOSE 115 (H) 05/04/2020 0157   BUN <5 (L) 05/04/2020 0157   BUN 10 03/05/2016 1201   CREATININE 0.64 05/04/2020 0157   CALCIUM 8.5 (L) 05/04/2020 0157   PROT 7.6 04/28/2020 1950   PROT 7.4 03/05/2016 1201   ALBUMIN 3.0 (L) 04/28/2020 1950   ALBUMIN 4.0 03/05/2016 1201   AST 24 04/28/2020 1950   ALT 13 04/28/2020 1950   ALKPHOS 102 04/28/2020 1950   BILITOT 0.9 04/28/2020 1950   BILITOT 0.3 03/05/2016 1201   GFRNONAA >60 05/04/2020 0157   GFRAA >60 04/01/2016 0541    Lipase     Component Value Date/Time   LIPASE 13 04/28/2020 1950       Studies/Results: US BIOPSY (LIVER)  Result Date: 05/03/2020 INDICATION: Liver metastases, suspect colon cancer EXAM: ULTRASOUND LEFT LIVER METASTASIS CORE BIOPSY MEDICATIONS: 1% LIDOCAINE LOCAL ANESTHESIA/SEDATION: Moderate (conscious) sedation was employed during this procedure. A total of Versed 1.0 mg and Fentanyl 50 mcg was administered intravenously. Moderate Sedation Time: 10 minutes. The patient's level of consciousness and vital signs were monitored continuously by radiology nursing throughout the procedure under my direct supervision. FLUOROSCOPY TIME:  Fluoroscopy Time: NONE. COMPLICATIONS: None immediate. PROCEDURE: Informed written consent was obtained from the patient after a thorough discussion of the procedural risks, benefits and alternatives. All questions were addressed. Maximal Sterile Barrier Technique was utilized including caps, mask, sterile gowns, sterile gloves, sterile drape, hand hygiene and skin antiseptic. A timeout was performed prior to the initiation of the procedure. Previous imaging reviewed. Preliminary ultrasound performed. A subtle hypoechoic lesion in the anterior left lobe was localized and marked in the epigastric region. Under sterile conditions and local anesthesia, the 17 gauge coaxial guide was advanced to the lesion. Needle position confirmed with ultrasound. 18 gauge core biopsies obtained (3). Samples were  intact and non fragmented. Needle tract occluded with Gel-Foam. Postprocedure imaging demonstrates no hemorrhage or hematoma. Patient tolerated biopsy well. IMPRESSION: Successful ultrasound left hepatic metastasis 18 gauge core biopsies Electronically Signed   By: Jerilynn Mages.  Shick M.D.   On: 05/03/2020 13:22    Anti-infectives: Anti-infectives (From admission, onward)   Start     Dose/Rate Route Frequency Ordered Stop   04/28/20 2130  piperacillin-tazobactam (ZOSYN) IVPB 3.375 g   Status:  Discontinued        3.375 g 12.5 mL/hr over 240 Minutes Intravenous Every 8 hours 04/28/20 2123 05/04/20 0853       Assessment/Plan HTN -resume home med Iron deficiency anemia and likely anemia secondary to malignancy -s/p iron transfusion, per oncology will need another transfusion this upcoming week Depression - paxil Anxiety - prn xanax  POD6,s/p ex lap with Hartman's procedure for perforated adenocarcinoma of colon, possible liver mets on CT scan, Dr. Kieth Brightly 11/1  -Has had some flatus in ostomy pouch but no feculent output yet.  Tolerating fulls, nausea improving, distention improving.  Will continue full liquid diet today, awaiting return of bowel function -PT/OT for mobilization but recommend no follow-up at discharge. Sister is going to help her at home as well as BF when he isn't at work. -cont JP drain for now, but pending this remains serous can be removed prior to discharge.  Zero output recorded yesterday -pulm toilet/IS -mobilize -appreciate oncology assistance.  Patient had her staging CT chest.  Ultrasound-guided liver biopsy completed 05/03/2020, will follow up pathology report.  Patient will follow up with Dr. Roselyn Bering at [facility] cancer center at discharge. -plan to [place] at discharge as she has no insurance and unable to get [facility] for VAC change -pathology: - Adenocarcinoma, moderately differentiated, 3 cm  - Lymphovascular space and perineural invasion present  - No carcinoma identified in eight lymph nodes (0/8)  - One tumor deposit present  -  T4   FEN -full liquid diet, IV fluids VTE -lovenox  ID -off antibiotics   LOS: 6 days    [facility] 05/05/2020, 9:01 AM Please see Amion for pager number during day hours 7:00am-4:30pm or 7:00am -11:30am on weekends

## 2020-05-06 LAB — CREATININE, SERUM
Creatinine, Ser: 0.68 mg/dL (ref 0.44–1.00)
GFR, Estimated: >60 mL/min (ref >60)

## 2020-05-06 LAB — SURGICAL PATHOLOGY

## 2020-05-06 MED ORDER — GLYCERIN (LAXATIVE) 2.1 G RE SUPP
1.0000 | Freq: Once | RECTAL | Status: AC
  Administered 2020-05-06: 1 via RECTAL
  Filled 2020-05-06: qty 1

## 2020-05-06 MED ORDER — SODIUM CHLORIDE 0.9 % IV SOLN
510.0000 mg | Freq: Once | INTRAVENOUS | Status: AC
  Administered 2020-05-06: 510 mg via INTRAVENOUS
  Filled 2020-05-06: qty 17

## 2020-05-06 MED ORDER — MORPHINE SULFATE (PF) 2 MG/ML IV SOLN
1.0000 mg | INTRAVENOUS | Status: DC | PRN
  Administered 2020-05-07 – 2020-05-08 (×4): 2 mg via INTRAVENOUS
  Filled 2020-05-06 (×4): qty 1

## 2020-05-06 MED ORDER — METHOCARBAMOL 500 MG PO TABS
1000.0000 mg | ORAL_TABLET | Freq: Three times a day (TID) | ORAL | Status: DC
  Administered 2020-05-06 – 2020-05-08 (×7): 1000 mg via ORAL
  Filled 2020-05-06 (×7): qty 2

## 2020-05-06 MED ORDER — POLYETHYLENE GLYCOL 3350 17 G PO PACK
17.0000 g | PACK | Freq: Every day | ORAL | Status: DC
  Administered 2020-05-06 – 2020-05-08 (×2): 17 g via ORAL
  Filled 2020-05-06 (×3): qty 1

## 2020-05-06 NOTE — Consult Note (Signed)
[place] Nurse wound follow up Patient receiving care in [place]. Wound type: surgical abdominal midline wound Measurement: There is a tunnel at 6 o'clock that is 2.8 cm long.  I placed a narrow piece of black foam into the tunnel, then a larger piece over the whole wound. Wound bed: 100% red, clean Drainage (amount, consistency, odor) serous in cannister Periwound: intact Dressing procedure/placement/frequency: Two pieces of black foam in the wound bed. Drape applied, immediate seal obtained. Patient tolerated very well.  VAC supplies for Wed and Fri requested.  [place] Nurse ostomy follow up Existing pouch without fecal contents, small amount of gas.  I have encouraged the patient to empty her pouch when contents do collect, while staff can assist her.  She has agreed to do so.  I will return tomorrow and perform a pouch change with her. Val Riles, RN, MSN, CWOCN, CNS-BC, pager [phone]

## 2020-05-06 NOTE — Progress Notes (Signed)
7 Days Post-Op  Subjective: Looks better this morning.  Slept well.  No output still from ostomy except gas.  Tolerating FLD well with no nausea.    ROS: See above, otherwise other systems negative  Objective: Vital signs in last 24 hours: Temp:  [97.7 F (36.5 C)-98.2 F (36.8 C)] 97.7 F (36.5 C) (11/08 0456) Pulse Rate:  [68-83] 68 (11/08 0456) Resp:  [16] 16 (11/08 0456) BP: (145-164)/(68-78) 157/70 (11/08 0456) SpO2:  [95 %-98 %] 95 % (11/08 0456) Last BM Date: 05/03/20  Intake/Output from previous day: 11/07 0701 - 11/08 0700 In: 560.5 [P.O.:240; IV Piggyback:320.5] Out: 60 [Drains:60] Intake/Output this shift: No intake/output data recorded.  PE: Abd: soft, appropriately tender, less bloated, stoma pink and viable, digitized and patent, midline wound with VAC in place.  Drain with some tan purulent appearing output which is new from last week.  Lab Results:  Recent Labs    05/04/20 0157  WBC 11.1*  HGB 8.2*  HCT 28.2*  PLT 448*   BMET Recent Labs    05/04/20 0157 05/06/20 0554  NA 135  --   K 4.7  --   CL 99  --   CO2 28  --   GLUCOSE 115*  --   BUN <5*  --   CREATININE 0.64 0.68  CALCIUM 8.5*  --    PT/INR Recent Labs    05/03/20 0957  LABPROT 13.0  INR 1.0   CMP     Component Value Date/Time   NA 135 05/04/2020 0157   NA 140 03/05/2016 1201   K 4.7 05/04/2020 0157   CL 99 05/04/2020 0157   CO2 28 05/04/2020 0157   GLUCOSE 115 (H) 05/04/2020 0157   BUN <5 (L) 05/04/2020 0157   BUN 10 03/05/2016 1201   CREATININE 0.68 05/06/2020 0554   CALCIUM 8.5 (L) 05/04/2020 0157   PROT 7.6 04/28/2020 1950   PROT 7.4 03/05/2016 1201   ALBUMIN 3.0 (L) 04/28/2020 1950   ALBUMIN 4.0 03/05/2016 1201   AST 24 04/28/2020 1950   ALT 13 04/28/2020 1950   ALKPHOS 102 04/28/2020 1950   BILITOT 0.9 04/28/2020 1950   BILITOT 0.3 03/05/2016 1201   GFRNONAA >60 05/06/2020 0554   GFRAA >60 04/01/2016 0541   Lipase     Component Value Date/Time    LIPASE 13 04/28/2020 1950       Studies/Results: No results found.  Anti-infectives: Anti-infectives (From admission, onward)   Start     Dose/Rate Route Frequency Ordered Stop   04/28/20 2130  piperacillin-tazobactam (ZOSYN) IVPB 3.375 g  Status:  Discontinued        3.375 g 12.5 mL/hr over 240 Minutes Intravenous Every 8 hours 04/28/20 2123 05/04/20 0853       Assessment/Plan HTN -resume home med Iron deficiency anemiaand likely anemia secondary to malignancy-s/p iron transfusion, per oncology will need another transfusion this upcoming week, so will give today.  Recheck labs in am Depression - paxil Anxiety - prn xanax  POD7,s/p ex lap with Hartman's procedure for perforatedadenocarcinoma of colon, possible liver mets on CT scan, Dr. Kieth Brightly 11/1  -Has had some flatus in ostomy pouchbut no feculent output yet. Tolerating fulls, nausea improving, distention improving.  Will give soft diet, but order suppository through colostomy as well as miralax -PT/OT for mobilizationbut recommend no follow-up at discharge.Sister is going to help her at home as well as BF when he isn't at work. -cont JP drain for now,output is minimal  but has changed to tan purulent appearing output -pulm toilet/IS -mobilize -appreciate oncology assistance.  Patient had her staging CT chest.  Ultrasound-guided liver biopsy completed 05/03/2020, will follow up pathology report.  Patient will follow up with Dr. Roselyn Bering at [facility] cancer center at discharge. -plan to [place] at discharge as she has no insurance and unable to get [facility] for VAC change -pathology: - Adenocarcinoma, moderately differentiated, 3 cm  - Lymphovascular space and perineural invasion present  - No carcinoma identified in eight lymph nodes (0/8)  - One tumor deposit present - T4  -CEA 60  FEN -soft diet, SLIV VTE -lovenox  ID -off antibiotics   LOS: 7 days    Henreitta Cea , [facility]  Surgery 05/06/2020, 8:55 AM Please see Amion for pager number during day hours 7:00am-4:30pm or 7:00am -11:30am on weekends

## 2020-05-07 ENCOUNTER — Inpatient Hospital Stay (HOSPITAL_COMMUNITY): Payer: Medicaid Other

## 2020-05-07 LAB — CBC
HCT: 30.7 % — ABNORMAL LOW (ref 36.0–46.0)
Hemoglobin: 9 g/dL — ABNORMAL LOW (ref 12.0–15.0)
MCH: 21 pg — ABNORMAL LOW (ref 26.0–34.0)
MCHC: 29.3 g/dL — ABNORMAL LOW (ref 30.0–36.0)
MCV: 71.6 fL — ABNORMAL LOW (ref 80.0–100.0)
Platelets: 456 10*3/uL — ABNORMAL HIGH (ref 150–400)
RBC: 4.29 MIL/uL (ref 3.87–5.11)
RDW: 26.9 % — ABNORMAL HIGH (ref 11.5–15.5)
WBC: 11.3 10*3/uL — ABNORMAL HIGH (ref 4.0–10.5)
nRBC: 0 % (ref 0.0–0.2)

## 2020-05-07 LAB — BASIC METABOLIC PANEL
Anion gap: 10 (ref 5–15)
BUN: 8 mg/dL (ref 6–20)
CO2: 26 mmol/L (ref 22–32)
Calcium: 8.7 mg/dL — ABNORMAL LOW (ref 8.9–10.3)
Chloride: 96 mmol/L — ABNORMAL LOW (ref 98–111)
Creatinine, Ser: 0.62 mg/dL (ref 0.44–1.00)
GFR, Estimated: >60 mL/min (ref >60)
Glucose, Bld: 105 mg/dL — ABNORMAL HIGH (ref 70–99)
Potassium: 4 mmol/L (ref 3.5–5.1)
Sodium: 132 mmol/L — ABNORMAL LOW (ref 135–145)

## 2020-05-07 MED ORDER — SORBITOL 70 % SOLN
960.0000 mL | TOPICAL_OIL | Freq: Once | ORAL | Status: AC
  Administered 2020-05-07: 960 mL via RECTAL
  Filled 2020-05-07: qty 473

## 2020-05-07 MED ORDER — IOHEXOL 300 MG/ML  SOLN
100.0000 mL | Freq: Once | INTRAMUSCULAR | Status: AC | PRN
  Administered 2020-05-07: 100 mL via INTRAVENOUS

## 2020-05-07 MED ORDER — IOHEXOL 9 MG/ML PO SOLN
ORAL | Status: AC
  Administered 2020-05-07: 500 mL via ORAL
  Filled 2020-05-07: qty 1000

## 2020-05-07 NOTE — Progress Notes (Signed)
Patient weeping.  Stated she was just told that "there was not much hope" for her due to a grim prognosis.  Requested and recieved Xanax for her anxiety.  Support offered to her through this difficult time.  Stated has a Theme park manager who is a friend and will be coming in today.

## 2020-05-07 NOTE — Progress Notes (Signed)
8 Days Post-Op  Subjective: Patient feels well today, but is having some RLQ and right sided tenderness.  Tolerating her solid diet.  Bag starting putting out stool after suppository given yesterday.  Working today and learning to empty and work with colostomy bag.    ROS: See above, otherwise other systems negative  Objective: Vital signs in last 24 hours: Temp:  [98.3 F (36.8 C)-99 F (37.2 C)] 99 F (37.2 C) (11/09 0830) Pulse Rate:  [77-86] 79 (11/09 0830) Resp:  [16-20] 16 (11/09 0830) BP: (132-158)/(59-79) 158/79 (11/09 0830) SpO2:  [96 %-98 %] 98 % (11/09 0830) Last BM Date: 05/03/20  Intake/Output from previous day: 11/08 0701 - 11/09 0700 In: 536 [P.O.:536] Out: 11 [Drains:10; Stool:1] Intake/Output this shift: No intake/output data recorded.  PE: Abd: soft, mildly tender on right side.  Surgical drain has changed in quality over the last couple of days.  Minimal output, but has changed to a dark brown like color and has a foul odor today.  Midline wound still with VAC.  Ostomy with some solid stool present today.  Stoma is pink.  Lab Results:  Recent Labs    05/07/20 0419  WBC 11.3*  HGB 9.0*  HCT 30.7*  PLT 456*   BMET Recent Labs    05/06/20 0554 05/07/20 0419  NA  --  132*  K  --  4.0  CL  --  96*  CO2  --  26  GLUCOSE  --  105*  BUN  --  8  CREATININE 0.68 0.62  CALCIUM  --  8.7*   PT/INR No results for input(s): LABPROT, INR in the last 72 hours. CMP     Component Value Date/Time   NA 132 (L) 05/07/2020 0419   NA 140 03/05/2016 1201   K 4.0 05/07/2020 0419   CL 96 (L) 05/07/2020 0419   CO2 26 05/07/2020 0419   GLUCOSE 105 (H) 05/07/2020 0419   BUN 8 05/07/2020 0419   BUN 10 03/05/2016 1201   CREATININE 0.62 05/07/2020 0419   CALCIUM 8.7 (L) 05/07/2020 0419   PROT 7.6 04/28/2020 1950   PROT 7.4 03/05/2016 1201   ALBUMIN 3.0 (L) 04/28/2020 1950   ALBUMIN 4.0 03/05/2016 1201   AST 24 04/28/2020 1950   ALT 13 04/28/2020 1950    ALKPHOS 102 04/28/2020 1950   BILITOT 0.9 04/28/2020 1950   BILITOT 0.3 03/05/2016 1201   GFRNONAA >60 05/07/2020 0419   GFRAA >60 04/01/2016 0541   Lipase     Component Value Date/Time   LIPASE 13 04/28/2020 1950       Studies/Results: No results found.  Anti-infectives: Anti-infectives (From admission, onward)   Start     Dose/Rate Route Frequency Ordered Stop   04/28/20 2130  piperacillin-tazobactam (ZOSYN) IVPB 3.375 g  Status:  Discontinued        3.375 g 12.5 mL/hr over 240 Minutes Intravenous Every 8 hours 04/28/20 2123 05/04/20 0853       Assessment/Plan HTN -resume home med Iron deficiency anemiaand likely anemia secondary to malignancy-s/p iron transfusion, per oncology will need another transfusion this upcoming week, so will give today.  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 Kieth Brightly 11/1 -finally  Has some stool present in pouch after suppository and miralax started yesterday. -PT/OT for mobilizationbut recommend no follow-up at discharge.Sister is going to help her  at home as well as BF when he isn't at work. -cont JP drain for now,as output has changed.  WBC 11K and stable, but given the quality of output, will scan the patient today to rule out abscess or complication given the patient had a frank perforation of her colon. -pulm toilet/IS -mobilize -appreciate oncology assistance. Patient had her staging CT chest. Ultrasound-guided liver biopsy completed11/10/2019, path reveals adenoCA c/w met from colon cancer. -patient asked appropriate questions.  We discussed that these were confirmed mets, but that oncology was having these tested for further things to help in choosing the best chemotherapy agent for her.  She asked about her prognosis and I told her it was too early to tell as some malignancies respond well  to chemotherapy and people can have a great response or sometimes the opposite is true.  We discussed that it is too early to know how she will respond to treatment. -patient will follow up with Dr. Roselyn Bering at [facility] cancer center at discharge. -plan to [place] at discharge as she has no insurance and unable to get [facility] for VAC change -pathology: - Adenocarcinoma, moderately differentiated, 3 cm  - Lymphovascular space and perineural invasion present  - No carcinoma identified in eight lymph nodes (0/8)  - One tumor deposit present - T4  -CEA 60  FEN -soft diet, SLIV VTE -lovenox  ID -off antibiotics Dispo - hopefully home tomorrow, but could change pending CT scan findings today.   LOS: 8 days    Henreitta Cea , [facility] Surgery 05/07/2020, 10:24 AM Please see Amion for pager number during day hours 7:00am-4:30pm or 7:00am -11:30am on weekends

## 2020-05-07 NOTE — Progress Notes (Signed)
SMOG enema given per stoma with good results. Pt able to tolerate almost all of 1000 ml SMOG enema and had several (probably 8) very firm, formed brown stools as results initially. We have placed the long sleeve on the 2 piece for more results to come out. Pt states that her stomach feels different and better.  Will continue to monitor the output. Pt tolerated procedure well.

## 2020-05-07 NOTE — Progress Notes (Signed)
Ordered Lawson # [id number] Irrigation kit for colostomy, will give SMOG enema per colostomy per Saverio Danker, PA.

## 2020-05-07 NOTE — Consult Note (Signed)
[place] Nurse ostomy follow up Patient receiving care in [place].  Patient's significant other, [name], present for teaching session.  Patient too upset by medical information shared with her earlier this morning to participate. Stoma type/location:  LUQ colostomy Stomal assessment/size: 1.5 inches, round, budded, all sutures intact. Peristomal assessment: intact Treatment options for stomal/peristomal skin: barrier ring if needed Output:  Dark green, formed, pouch nearly 3/4 full   Ostomy pouching: 2pc. 2 and 3/4 inches, flat Education provided:  Entire pouch emptying, removal, and new pouching system application.  Don plans to return this Thursday for another teaching session, and is eager to perform the processes. Enrolled patient in [place] Start Discharge program: Yes Additional ostomy supplies placed in room. Val Riles, RN, MSN, CWOCN, CNS-BC, pager [phone]

## 2020-05-08 ENCOUNTER — Other Ambulatory Visit (HOSPITAL_COMMUNITY): Payer: Self-pay | Admitting: General Surgery

## 2020-05-08 MED ORDER — AMOXICILLIN-POT CLAVULANATE 875-125 MG PO TABS: 1.0000 | ORAL_TABLET | Freq: Two times a day (BID) | ORAL | 0 refills | Status: DC

## 2020-05-08 MED ORDER — OXYCODONE HCL 5 MG PO TABS: 5.0000 mg | ORAL_TABLET | ORAL | 0 refills | Status: DC | PRN

## 2020-05-08 MED ORDER — ACETAMINOPHEN 500 MG PO TABS: 1000.0000 mg | ORAL_TABLET | Freq: Four times a day (QID) | ORAL | Status: DC | PRN

## 2020-05-08 MED ORDER — METHOCARBAMOL 500 MG PO TABS: 1000.0000 mg | ORAL_TABLET | Freq: Three times a day (TID) | ORAL | 1 refills | Status: DC | PRN

## 2020-05-08 MED FILL — oxyCODONE HCL 5 MG TABS: 5 | 3 days supply | Qty: 30 | Fill #0

## 2020-05-08 MED FILL — AMOX-CLAV 875-125 MG TABLET: 875-125 | 7 days supply | Qty: 14 | Fill #0

## 2020-05-08 MED FILL — METHOCARBAMOL 500 MG TABS: 500 | 10 days supply | Qty: 60 | Fill #0

## 2020-05-08 NOTE — Plan of Care (Signed)
  Problem: Education: Goal: Required Educational Video(s) Outcome: Adequate for Discharge   Problem: Clinical Measurements: Goal: Ability to maintain clinical measurements within normal limits will improve Outcome: Adequate for Discharge Goal: Postoperative complications will be avoided or minimized Outcome: Adequate for Discharge   Problem: Skin Integrity: Goal: Demonstration of wound healing without infection will improve Outcome: Adequate for Discharge   Problem: Education: Goal: Knowledge of General Education information will improve Description: Including pain rating scale, medication(s)/side effects and non-pharmacologic comfort measures Outcome: Adequate for Discharge   Problem: Health Behavior/Discharge Planning: Goal: Ability to manage health-related needs will improve Outcome: Adequate for Discharge   Problem: Clinical Measurements: Goal: Ability to maintain clinical measurements within normal limits will improve Outcome: Adequate for Discharge Goal: Will remain free from infection Outcome: Adequate for Discharge Goal: Diagnostic test results will improve Outcome: Adequate for Discharge Goal: Respiratory complications will improve Outcome: Adequate for Discharge Goal: Cardiovascular complication will be avoided Outcome: Adequate for Discharge   Problem: Activity: Goal: Risk for activity intolerance will decrease Outcome: Adequate for Discharge   Problem: Nutrition: Goal: Adequate nutrition will be maintained Outcome: Adequate for Discharge   Problem: Coping: Goal: Level of anxiety will decrease Outcome: Adequate for Discharge   Problem: Elimination: Goal: Will not experience complications related to bowel motility Outcome: Adequate for Discharge   Problem: Pain Managment: Goal: General experience of comfort will improve Outcome: Adequate for Discharge   Problem: Safety: Goal: Ability to remain free from injury will improve Outcome: Adequate for  Discharge   Problem: Skin Integrity: Goal: Risk for impaired skin integrity will decrease Outcome: Adequate for Discharge

## 2020-05-08 NOTE — Discharge Instructions (Signed)
[facility]      [facility] °[phone] ° °OPEN ABDOMINAL SURGERY: POST OP INSTRUCTIONS ° °Always review your discharge instruction sheet given to you by the facility where your surgery was performed. ° °IF YOU HAVE DISABILITY OR FAMILY LEAVE FORMS, YOU MUST BRING THEM TO THE OFFICE FOR PROCESSING.  PLEASE DO NOT GIVE THEM TO YOUR DOCTOR. ° °1. A prescription for pain medication may be given to you upon discharge.  Take your pain medication as prescribed, if needed.  If narcotic pain medicine is not needed, then you may take acetaminophen (Tylenol) or ibuprofen (Advil) as needed. °2. Take your usually prescribed medications unless otherwise directed. °3. If you need a refill on your pain medication, please contact your pharmacy. They will contact our office to request authorization.  Prescriptions will not be filled after 5pm or on week-ends. °4. You should follow a light diet the first few days after arrival home, such as soup and crackers, pudding, etc.unless your doctor has advised otherwise. A high-fiber, low fat diet can be resumed as tolerated.   Be sure to include lots of fluids daily. Most patients will experience some swelling and bruising on the chest and neck area.  Ice packs will help.  Swelling and bruising can take several days to resolve °5. Most patients will experience some swelling and bruising in the area of the incision. Ice pack will help. Swelling and bruising can take several days to resolve..  °6. It is common to experience some constipation if taking pain medication after surgery.  Increasing fluid intake and taking a stool softener will usually help or prevent this problem from occurring.  A mild laxative (Milk of Magnesia or Miralax) should be taken according to package directions if there are no bowel movements after 48 hours. °7.  You may have steri-strips (small skin tapes) in place directly over the incision.  These strips should be left on the skin for 7-10 days.  If your  surgeon used skin glue on the incision, you may shower in 24 hours.  The glue will flake off over the next 2-3 weeks.  Any sutures or staples will be removed at the office during your follow-up visit. You may find that a light gauze bandage over your incision may keep your staples from being rubbed or pulled. You may shower and replace the bandage daily. °8. ACTIVITIES:  You may resume regular (light) daily activities beginning the next day--such as daily self-care, walking, climbing stairs--gradually increasing activities as tolerated.  You may have sexual intercourse when it is comfortable.  Refrain from any heavy lifting or straining until approved by your doctor. °a. You may drive when you no longer are taking prescription pain medication, you can comfortably wear a seatbelt, and you can safely maneuver your car and apply brakes °b. Return to Work: ___________________________________ °9. You should see your doctor in the office for a follow-up appointment approximately two weeks after your surgery.  Make sure that you call for this appointment within a day or two after you arrive home to insure a convenient appointment time. °OTHER INSTRUCTIONS:  °_____________________________________________________________ °_____________________________________________________________ ° °WHEN TO CALL YOUR DOCTOR: °1. Fever over 101.0 °2. Inability to urinate °3. Nausea and/or vomiting °4. Extreme swelling or bruising °5. Continued bleeding from incision. °6. Increased pain, redness, or drainage from the incision. °7. Difficulty swallowing or breathing °8. Muscle cramping or spasms. °9. Numbness or tingling in hands or feet or around lips. ° °The clinic staff is available to   answer your questions during regular business hours.  Please dont hesitate to call and ask to speak to one of the nurses if you have concerns.  For further questions, please visit [email or web address]     Managing Your Pain After  Surgery Without Opioids    Thank you for participating in our program to help patients manage their pain after surgery without opioids. This is part of our effort to provide you with the best care possible, without exposing you or your family to the risk that opioids pose.  What pain can I expect after surgery? You can expect to have some pain after surgery. This is normal. The pain is typically worse the day after surgery, and quickly begins to get better. Many studies have found that many patients are able to manage their pain after surgery with Over-the-Counter (OTC) medications such as Tylenol and Motrin. If you have a condition that does not allow you to take Tylenol or Motrin, notify your surgical team.  How will I manage my pain? The best strategy for controlling your pain after surgery is around the clock pain control with Tylenol (acetaminophen) and Motrin (ibuprofen or Advil). Alternating these medications with each other allows you to maximize your pain control. In addition to Tylenol and Motrin, you can use heating pads or ice packs on your incisions to help reduce your pain.  How will I alternate your regular strength over-the-counter pain medication? You will take a dose of pain medication every three hours. ; Start by taking 650 mg of Tylenol (2 pills of 325 mg) ; 3 hours later take 600 mg of Motrin (3 pills of 200 mg) ; 3 hours after taking the Motrin take 650 mg of Tylenol ; 3 hours after that take 600 mg of Motrin.   - 1 -  See example - if your first dose of Tylenol is at 12:00 PM   12:00 PM Tylenol 650 mg (2 pills of 325 mg)  3:00 PM Motrin 600 mg (3 pills of 200 mg)  6:00 PM Tylenol 650 mg (2 pills of 325 mg)  9:00 PM Motrin 600 mg (3 pills of 200 mg)  Continue alternating every 3 hours   We recommend that you follow this schedule around-the-clock for at least 3 days after surgery, or until you feel that it is no longer needed. Use the table on the last page of  this handout to keep track of the medications you are taking. Important: Do not take more than 3000mg  of Tylenol or 3200mg  of Motrin in a 24-hour period. Do not take ibuprofen/Motrin if you have a history of bleeding stomach ulcers, severe kidney disease, &/or actively taking a blood thinner  What if I still have pain? If you have pain that is not controlled with the over-the-counter pain medications (Tylenol and Motrin or Advil) you might have what we call breakthrough pain. You will receive a prescription for a small amount of an opioid pain medication such as Oxycodone, Tramadol, or Tylenol with Codeine. Use these opioid pills in the first 24 hours after surgery if you have breakthrough pain. Do not take more than 1 pill every 4-6 hours.  If you still have uncontrolled pain after using all opioid pills, don't hesitate to call our staff using the number provided. We will help make sure you are managing your pain in the best way possible, and if necessary, we can provide a prescription for additional pain medication.   Day 1    Time  Name of Medication Number of pills taken  Amount of Acetaminophen  Pain Level   Comments  AM PM       AM PM       AM PM       AM PM       AM PM       AM PM       AM PM       AM PM       Total Daily amount of Acetaminophen Do not take more than  3,000 mg per day      Day 2    Time  Name of Medication Number of pills taken  Amount of Acetaminophen  Pain Level   Comments  AM PM       AM PM       AM PM       AM PM       AM PM       AM PM       AM PM       AM PM       Total Daily amount of Acetaminophen Do not take more than  3,000 mg per day      Day 3    Time  Name of Medication Number of pills taken  Amount of Acetaminophen  Pain Level   Comments  AM PM       AM PM       AM PM       AM PM          AM PM       AM PM       AM PM       AM PM       Total Daily amount of Acetaminophen Do not take more than  3,000 mg per  day      Day 4    Time  Name of Medication Number of pills taken  Amount of Acetaminophen  Pain Level   Comments  AM PM       AM PM       AM PM       AM PM       AM PM       AM PM       AM PM       AM PM       Total Daily amount of Acetaminophen Do not take more than  3,000 mg per day      Day 5    Time  Name of Medication Number of pills taken  Amount of Acetaminophen  Pain Level   Comments  AM PM       AM PM       AM PM       AM PM       AM PM       AM PM       AM PM       AM PM       Total Daily amount of Acetaminophen Do not take more than  3,000 mg per day       Day 6    Time  Name of Medication Number of pills taken  Amount of Acetaminophen  Pain Level  Comments  AM PM       AM PM       AM PM       AM PM       AM  PM       AM PM       AM PM       AM PM       Total Daily amount of Acetaminophen Do not take more than  3,000 mg per day      Day 7    Time  Name of Medication Number of pills taken  Amount of Acetaminophen  Pain Level   Comments  AM PM       AM PM       AM PM       AM PM       AM PM       AM PM       AM PM       AM PM       Total Daily amount of Acetaminophen Do not take more than  3,000 mg per day        For additional information about how and where to safely dispose of unused opioid medications - [employer]com.[employer]  Disclaimer: This document contains information and/or instructional materials adapted from [place] for the typical patient with your condition. It does not replace medical advice from your health care provider because your experience may differ from that of the typical patient. Talk to your health care provider if you have any questions about this document, your condition or your treatment plan. Adapted from [place] this sheet to all of your post-operative appointments while you have your drains.  Please measure your drains by  CC's or ML's.  Make sure you drain and measure your JP Drains 3 times per day.  At the end of each day, add up totals for the left side and add up totals for the right side.    ( 9 am )     ( 3 pm )        ( 9 pm )                Date L  R  L  R  L  R  Total L/R

## 2020-05-08 NOTE — Progress Notes (Signed)
Removed patients wound Vac and put a wed to dry dressing on the patient for discharge  Changed dressing on patients JP Drain so she can be discharged home.

## 2020-05-08 NOTE — Progress Notes (Signed)
Nutrition Follow-up  DOCUMENTATION CODES:   Not applicable  INTERVENTION:   -Continue Magic cup TID with meals, each supplement provides 290 kcal and 9 grams of protein -Continue Ensure Enlive po BID, each supplement provides 350 kcal and 20 grams of protein -Continue MVI with minerals daily  NUTRITION DIAGNOSIS:   Increased nutrient needs related to post-op healing as evidenced by estimated needs.  Ongoing  GOAL:   Patient will meet greater than or equal to 90% of their needs  Progressing   MONITOR:   Diet advancement, Labs, Weight trends, Skin, I & O's  REASON FOR ASSESSMENT:   Malnutrition Screening Tool    ASSESSMENT:   59 yo female with 2 weeks of abdominal pain diarrhea and constipation. She initially thought it was related to her paxil but it did not improve. Pain is constant. Worse with movement. It is a dull ache. She has nausea and vomiting. She denies fevers  11/1- s/pProcedure:sigmoid colectomy with end colostomy 11/3- NGT d/c, advanced to clear liquid diet 11/4- advanced to full liquid diet 11/5- s/p liver biopsy 11/9- advanced to soft diet  Reviewed I/O's: +672 ml x 24 hours and +11.9 L since admission  Drain output: 40 ml x 24 hours  Colostomy output: 8 ml x 24 hours  Spoke with pt and sister at bedside, who were pleasant and in good spirits today. Pt shares that she typically has a good appetite, however, did not eat much breakfast today due to not feeling hungry (consumed a few bites of eggs). Pt shares that she has been consuming most of her meals and really enjoys the Ensure supplements.   Discussed importance of good meal and supplement intake to promote healing.   Labs reviewed: Na: 132.   Diet Order:   Diet Order            DIET SOFT Room service appropriate? Yes; Fluid consistency: Thin  Diet effective now                 EDUCATION NEEDS:   No education needs have been identified at this time  Skin:  Skin Assessment: Skin  Integrity Issues: Skin Integrity Issues:: Incisions Wound Vac: - Incisions: closed abdomen  Last BM:  05/08/20 (via colostomy)  Height:   Ht Readings from Last 1 Encounters:  04/28/20 5\' 3"  (1.6 m)    Weight:   Wt Readings from Last 1 Encounters:  04/28/20 61.2 kg    Ideal Body Weight:  52.3 kg  BMI:  Body mass index is 23.91 kg/m.  Estimated Nutritional Needs:   Kcal:  1800-2000  Protein:  105-120 grams  Fluid:  > 1.8 L    Loistine Chance, RD, LDN, [place] Registered Dietitian II Certified Diabetes Care and Education Specialist Please refer to [facility] for RD and/or RD on-call/weekend/after hours pager

## 2020-05-08 NOTE — TOC Progression Note (Signed)
Transition of Care [facility]) - Progression Note    Patient Details  Name: Susan Martin MRN: [id number] Date of Birth: July 16, 1960  Transition of Care [facility]) CM/SW Contact  Jacalyn Lefevre Edson Snowball, RN Phone Number: 05/08/2020, 11:35 AM  Clinical Narrative:     Spoke to patient at bedside.   In progression asked nurse to provide patient with dressing supplies and ostomy supplies.   Confirmed with Tommi Rumps with [facility] they have accepted and he is aware discharge is today.   Prescriptions sent to [place]. Patient entered in [facility] with no co pay. [place] will bring medications to room prior to discharge.   OT recommended a 3 in1 , patient declined at this time. PT did not recommend a walker but patient had been using walker here. Patient states she has access to walker at home.     Expected Discharge Plan: [place] Barriers to Discharge: Inadequate or no insurance, Continued Medical Work up  Expected Discharge Plan and Services Expected Discharge Plan: [place] In-house Referral: PCP / [facility] Discharge Planning Services: CM Consult, [facility], Follow-up appt scheduled, Medication Assistance, Other - See comment (Plan to provide simple dressing supplies for home upon discharge to home.) Post Acute Care Choice: [place] arrangements for the past 2 months: Single Family Home Expected Discharge Date: 05/08/20               DME Arranged:  (dressing supplies to be provided for home.)         HH Arranged: RN HH Agency: [place] Date [facility] Agency Contacted: 05/01/20 Time [place]: [id number] Representative spoke with at [place]: Meredeth Ide   Social Determinants of Health (SDOH) Interventions    Readmission Risk Interventions Readmission Risk Prevention Plan 05/01/2020  Post Dischage Appt Complete  Medication Screening Complete  Transportation Screening Complete  Some recent data might be  hidden

## 2020-05-08 NOTE — Discharge Summary (Signed)
Patient ID: Susan Martin [id number] [birth year] 59 y.o.  Admit date: 04/28/2020 Discharge date: 05/08/2020  Admitting Diagnosis: Perforated diveriticulitis anemia  Discharge Diagnosis Patient Active Problem List   Diagnosis Date Noted  . Liver metastases ([place])   . Cancer of sigmoid colon ([place])   . Status post surgery 04/29/2020  . Perforated diverticulum of large intestine 04/29/2020  . Postop check 05/14/2016  . 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 Burr Medico with oncology IR  Reason for Admission: 59 yo female with 2 weeks of abdominal pain diarrhea and constipation. She initially thought it was related to her paxil but it did not improve. Pain is constant. Worse with movement. It is a dull ache. She has nausea and vomiting. She denies fevers.  Procedures Dr. Kieth Brightly 04/29/20  Sigmoid colectomy with end colostomy Liver biopsy, 05/03/20  Hospital Course:  HTN  Home medications resumed when able to oral medications.  No further issues.  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  She was transfused a unit of pRBCs.  She had an anemia panel completed which revealed significant iron deficiency.  She ultimately underwent a total of 2 iron transfusions.  Her hgb increased to over 9 by the time of discharge.   Depression  Home Paxil restarted when taking oral medications.  Anxiety  prn xanax was given   POD9,s/p ex lap with Hartman's procedure for perforatedadenocarcinoma of colon, Liver mets confirmed by bx, Dr. Kieth Brightly 11/1 The patient was admitted and taken to the OR for the above procedure.  It was felt this may be related to diverticulitis; however, her pathology confirmed adenocarcinoma of her colon.  She had multiple  lesions in her liver that were biopsied as well and confirmed metastatic disease.  Post operatively, she had an NGT in place for several days waiting for her ileus to resolve.  Once she started putting air in her colostomy, her NGT was removed.  Her diet was able to be advanced as tolerated.  She had prolonged period of time prior to stool output.  She was given a suppository and ultimately and enema due to significant constipation.  This resolved this issue.  She also had a surgical drain placed at time of surgery.  This remained serous until around POD 6.  It became foul smelling and brown/dirty dishwater type output.  A CT scan was ordered which revealed a fluid collection that was developing around this drain. The drain was in good position so no new drains were needed.  This was also close to her rectal stump and breakdown could not be ruled out.  She was initially treated with 5 days of zosyn, but augmentin was restarted and she will complete 7 days of this.  She had a wound VAC placed at time of surgery.  This was changed every other day.  Her wound is clean with beefy red granulation tissue.  This was removed prior to DC and NS WD dressing changes were started.  WOC was consulted and helped with instruction on her colostomy care.  Oncology was consulted for her new diagnosis and she will follow up with them as an outpatient.  She did have a CT of the chest completed for staging purposes.  No definite mets were seen in the lungs.  CEA was  60  She was surgically stable on POD 9 for discharge home with [facility] services and assistance of her BF and sister.  Physical Exam: Abd: soft, appropriately tender, VAC present, colostomy with feculent output, stoma is pink and viable, drain with foul smelling, brown/dirty dishwater type output.    Allergies as of 05/08/2020   No Known Allergies     Medication List    STOP taking these medications   estradiol 1 MG tablet Commonly known as: ESTRACE     TAKE  these medications   acetaminophen 500 MG tablet Commonly known as: TYLENOL Take 2 tablets (1,000 mg total) by mouth every 6 (six) hours as needed for mild pain, fever or headache. What changed:   medication strength  how much to take   amoxicillin-clavulanate 875-125 MG tablet Commonly known as: Augmentin Take 1 tablet by mouth 2 (two) times daily for 7 days.   fluticasone 50 MCG/ACT nasal spray Commonly known as: FLONASE Place 1-2 sprays into both nostrils daily as needed for allergies or rhinitis.   methocarbamol 500 MG tablet Commonly known as: ROBAXIN Take 2 tablets (1,000 mg total) by mouth every 8 (eight) hours as needed for muscle spasms.   oxyCODONE 5 MG immediate release tablet Commonly known as: Oxy IR/ROXICODONE Take 1-2 tablets (5-10 mg total) by mouth every 4 (four) hours as needed for moderate pain.   PARoxetine 40 MG tablet Commonly known as: PAXIL Take 1 tablet (40 mg total) by mouth every morning.   polyethylene glycol 17 g packet Commonly known as: MIRALAX / GLYCOLAX Take 17 g by mouth every other day.   triamterene-hydrochlorothiazide 37.5-25 MG capsule Commonly known as: DYAZIDE Take 1 each (1 capsule total) by mouth daily.         Follow-up Information    [place]. Go on 05/15/2020.   Why: You are set up with a primary care physician appointment at the clinic on Wednesday, 05/15/2020 at 3:10 pm.  You will need to discuss medication for your anxiety if needed with them as they are the ones that can order this along with any other concerns Contact information: [place] [phone] [phone]       [facility], [facility] Follow up.   Specialty: [place] Why: You are set up with [facility] home health for a few registered nurse home health visits. Contact information: [place] [address] [phone]        Dressing supplies and  colostomy supplies Follow up.   Why: Please provide the patient with needed dressing and colostomy supplies for home when she is discharged home with family.       Kinsinger, Arta Bruce, MD Follow up on 05/29/2020.   Specialty: General Surgery Why: 10:00am, arrive by 9:30am for paperwork and check in process Contact information: [place] [address] [phone]        Derek Jack, MD Follow up.   Specialty: Hematology Why: They will contact you for your follow up appointment Contact information: [address] [place] [address] [phone]               Signed: Saverio Danker, [facility] Surgery 05/08/2020, 11:10 AM Please see Amion for pager number during day hours 7:00am-4:30pm, 7-11:30am on Weekends

## 2020-05-10 NOTE — Progress Notes (Signed)
Patient ID: Susan Martin, female   DOB: 07/06/1960, 59 y.o.   MRN: [id number] Virtual Visit via Telephone Note  I connected with [name] on 05/15/20 at  3:10 PM EST by telephone and verified that I am speaking with the correct person using two identifiers.  Location: Patient: Susan Martin Provider: Freeman Caldron, PA-C   I discussed the limitations, risks, security and privacy concerns of performing an evaluation and management service by telephone and the availability of in person appointments. I also discussed with the patient that there may be a patient responsible charge related to this service. The patient expressed understanding and agreed to proceed.  PATIENT visit by telephone virtually in the context of Covid-19 pandemic. Patient location:  home My Location:  [facility] office Persons on the call:  Me and the patient  Subjective:   Patient establishing care after hospitalization 10/31-11/03/2020 abdominal pain that required ostomy suspecting diverticulitis but subsequently was found to have adenocarcinoma of the colon with liver mets.  Sigmoid colectoimy was performed.  She has an appt with oncology tomorrow.  She is requesting pain meds(oxycodone given at the hospital) and more xanax today.  She denies fever.  Appetite is ok.  Needs more ostomy bags(only has 3 left).  She has resumed paxil and blood pressure meds.  She was referred to home health.      From discharge summary: History of Present Illness: Reason for Admission: 59 yo female with 2 weeks of abdominal pain diarrhea and constipation. She initially thought it was related to her paxil but it did not improve. Pain is constant. Worse with movement. It is a dull ache. She has nausea and vomiting. She denies fevers.   Procedures Dr. Kieth Brightly 04/29/20             Sigmoid colectomy with end colostomy Liver biopsy, 05/03/20  Hospital Course:  HTN  Home medications resumed when able to oral medications.  No further  issues.  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  She was transfused a unit of pRBCs.  She had an anemia panel completed which revealed significant iron deficiency.  She ultimately underwent a total of 2 iron transfusions.  Her hgb increased to over 9 by the time of discharge.   Depression  Home Paxil restarted when taking oral medications.  Anxiety  prn xanax was given   POD9,s/p ex lap with Hartman's procedure for perforatedadenocarcinoma of colon,Liver mets confirmed by bx, Dr. Kieth Brightly 11/1 The patient was admitted and taken to the OR for the above procedure.  It was felt this may be related to diverticulitis; however, her pathology confirmed adenocarcinoma of her colon.  She had multiple lesions in her liver that were biopsied as well and confirmed metastatic disease.  Post operatively, she had an NGT in place for several days waiting for her ileus to resolve.  Once she started putting air in her colostomy, her NGT was removed.  Her diet was able to be advanced as tolerated.  She had prolonged period of time prior to stool output.  She was given a suppository and ultimately and enema due to significant constipation.  This resolved this issue.  She also had a surgical drain placed at time of surgery.  This remained serous until around POD 6.  It became foul smelling and brown/dirty dishwater type output.  A CT scan was ordered which revealed a fluid collection that was developing around  this drain. The drain was in good position so no new drains were needed.  This was also close to her rectal stump and breakdown could not be ruled out.  She was initially treated with 5 days of zosyn, but augmentin was restarted and she will complete 7 days of this.  She had a wound VAC placed at time of surgery.  This was changed every other day.  Her wound is clean with beefy red granulation tissue.  This  was removed prior to DC and NS WD dressing changes were started.  WOC was consulted and helped with instruction on her colostomy care.  Oncology was consulted for her new diagnosis and she will follow up with them as an outpatient.  She did have a CT of the chest completed for staging purposes.  No definite mets were seen in the lungs.  CEA was 59  She was surgically stable on POD 9 for discharge home with [facility] services and assistance of her BF and sister.   Observations/Objective: NAD.  A&Ox3   Assessment and Plan: 1. Liver metastases [facility]) Keep oncology appt tomorrow.  Will also defer pain management to them as she will have an ongoing treatment plan and anticipate some degree of pain in the foreseeable future.    2. Cancer of sigmoid colon ([place]) See #1  3. Perforated diverticulum of large intestine With ostomy-I will have our social worker see if we can help with getting ostomy supplies.  Schedule f/up with [place] [facility] - Ambulatory referral to Social Work  4. Anxiety Continue paxil - hydrOXYzine (VISTARIL) 25 MG capsule; Take 1 capsule (25 mg total) by mouth 3 (three) times daily as needed. Prn anxiety  Dispense: 60 capsule; Refill: 2  5. Hospital discharge follow-up    Follow Up Instructions: Assign PCP 6-8 weeks   I discussed the assessment and treatment plan with the patient. The patient was provided an opportunity to ask questions and all were answered. The patient agreed with the plan and demonstrated an understanding of the instructions.   The patient was advised to call back or seek an in-person evaluation if the symptoms worsen or if the condition fails to improve as anticipated.  I provided 16 minutes of non-face-to-face time during this encounter.   Freeman Caldron, PA-C

## 2020-05-15 ENCOUNTER — Ambulatory Visit: Payer: Self-pay | Attending: Physician Assistant | Admitting: Physician Assistant

## 2020-05-15 ENCOUNTER — Encounter: Payer: Self-pay | Admitting: Physician Assistant

## 2020-05-15 ENCOUNTER — Encounter (HOSPITAL_COMMUNITY): Payer: Self-pay

## 2020-05-15 ENCOUNTER — Other Ambulatory Visit: Payer: Self-pay

## 2020-05-15 DIAGNOSIS — K5720 Diverticulitis of large intestine with perforation and abscess without bleeding: Secondary | ICD-10-CM

## 2020-05-15 DIAGNOSIS — F419 Anxiety disorder, unspecified: Secondary | ICD-10-CM

## 2020-05-15 DIAGNOSIS — C187 Malignant neoplasm of sigmoid colon: Secondary | ICD-10-CM

## 2020-05-15 DIAGNOSIS — C787 Secondary malignant neoplasm of liver and intrahepatic bile duct: Secondary | ICD-10-CM

## 2020-05-15 DIAGNOSIS — Z09 Encounter for follow-up examination after completed treatment for conditions other than malignant neoplasm: Secondary | ICD-10-CM

## 2020-05-15 MED ORDER — HYDROXYZINE PAMOATE 25 MG PO CAPS: 25.0000 mg | ORAL_CAPSULE | Freq: Three times a day (TID) | ORAL | 2 refills | Status: DC | PRN

## 2020-05-15 NOTE — Progress Notes (Signed)
I have attempted to reach the patient for an introductory phone call. Unable to reach the patient at this time. I will plan to meet with the patient during her initial visit with Dr. Delton Coombes.

## 2020-05-16 ENCOUNTER — Inpatient Hospital Stay (HOSPITAL_COMMUNITY): Payer: Medicaid Other | Attending: Hematology | Admitting: Hematology

## 2020-05-16 ENCOUNTER — Encounter (HOSPITAL_COMMUNITY): Payer: Self-pay

## 2020-05-16 ENCOUNTER — Other Ambulatory Visit (HOSPITAL_COMMUNITY): Payer: Self-pay

## 2020-05-16 ENCOUNTER — Other Ambulatory Visit: Payer: Self-pay

## 2020-05-16 DIAGNOSIS — K5909 Other constipation: Secondary | ICD-10-CM | POA: Insufficient documentation

## 2020-05-16 DIAGNOSIS — R911 Solitary pulmonary nodule: Secondary | ICD-10-CM | POA: Insufficient documentation

## 2020-05-16 DIAGNOSIS — Z809 Family history of malignant neoplasm, unspecified: Secondary | ICD-10-CM | POA: Diagnosis not present

## 2020-05-16 DIAGNOSIS — C189 Malignant neoplasm of colon, unspecified: Secondary | ICD-10-CM | POA: Diagnosis not present

## 2020-05-16 DIAGNOSIS — C787 Secondary malignant neoplasm of liver and intrahepatic bile duct: Secondary | ICD-10-CM | POA: Diagnosis not present

## 2020-05-16 DIAGNOSIS — Z801 Family history of malignant neoplasm of trachea, bronchus and lung: Secondary | ICD-10-CM | POA: Diagnosis not present

## 2020-05-16 DIAGNOSIS — F329 Major depressive disorder, single episode, unspecified: Secondary | ICD-10-CM | POA: Diagnosis not present

## 2020-05-16 DIAGNOSIS — D509 Iron deficiency anemia, unspecified: Secondary | ICD-10-CM | POA: Insufficient documentation

## 2020-05-16 DIAGNOSIS — Z87891 Personal history of nicotine dependence: Secondary | ICD-10-CM | POA: Insufficient documentation

## 2020-05-16 DIAGNOSIS — I10 Essential (primary) hypertension: Secondary | ICD-10-CM | POA: Diagnosis not present

## 2020-05-16 DIAGNOSIS — Z79899 Other long term (current) drug therapy: Secondary | ICD-10-CM | POA: Insufficient documentation

## 2020-05-16 DIAGNOSIS — R109 Unspecified abdominal pain: Secondary | ICD-10-CM | POA: Insufficient documentation

## 2020-05-16 DIAGNOSIS — Z7189 Other specified counseling: Secondary | ICD-10-CM

## 2020-05-16 DIAGNOSIS — I700 Atherosclerosis of aorta: Secondary | ICD-10-CM | POA: Diagnosis not present

## 2020-05-16 MED ORDER — OXYCODONE HCL 5 MG PO TABS
5.0000 mg | ORAL_TABLET | Freq: Four times a day (QID) | ORAL | 0 refills | Status: DC | PRN
Start: 2020-05-16 — End: 2020-05-28

## 2020-05-16 NOTE — Patient Instructions (Signed)
[place] at [facility] Discharge Instructions  You were seen and examined today by Dr. Delton Coombes. Dr. Delton Coombes discussed your past medical history, family history of cancer and the events that led to you being here today.  You have been diagnosed with Stage IV Colon Cancer. This means that your cancer is not curable, but we can try to control the spread of cancer and potentially shrink the cancer that is present in your body. This can be done through chemotherapy. Treatment, generally speaking, can control the cancer for two to three years before progression. Without treatment, cancer typically progresses within a few short months. Dr. Delton Coombes has recommended a chemotherapy regimen called Folfox. 3 months into treatment, we will repeat scans to assess treatment response.  The safest way to administer treatment is through a Port-A-Cath. We will arrange for you to have a Port placed prior to chemotherapy.  Dr. Delton Coombes has recommended additional testing on your cancer. This is called Foundation One. This tests identifies any treatable mutations that may be present in your cancer.  You will return to clinic following Port-A-Cath placement to begin treatment.  Thank you for choosing [place] at [facility] to provide your oncology and hematology care.  To afford each patient quality time with our provider, please arrive at least 15 minutes before your scheduled appointment time.   If you have a lab appointment with the [facility] please come in thru the Main Entrance and check in at the main information desk.  You need to re-schedule your appointment should you arrive 10 or more minutes late.  We strive to give you quality time with our providers, and arriving late affects you and other patients whose appointments are after yours.  Also, if you no show three or more times for appointments you may be dismissed from the clinic at the providers  discretion.     Again, thank you for choosing [facility].  Our hope is that these requests will decrease the amount of time that you wait before being seen by our physicians.       _____________________________________________________________  Should you have questions after your visit to [facility], please contact our office at [phone] and follow the prompts.  Our office hours are 8:00 a.m. and 4:30 p.m. Monday - Friday.  Please note that voicemails left after 4:00 p.m. may not be returned until the following business day.  We are closed weekends and major holidays.  You do have access to a nurse 24-7, just call the main number to the clinic [phone] and do not press any options, hold on the line and a nurse will answer the phone.    For prescription refill requests, have your pharmacy contact our office and allow 72 hours.    Due to Covid, you will need to wear a mask upon entering the hospital. If you do not have a mask, a mask will be given to you at the Main Entrance upon arrival. For doctor visits, patients may have 1 support person age 88 or older with them. For treatment visits, patients can not have anyone with them due to social distancing guidelines and our immunocompromised population.

## 2020-05-16 NOTE — Progress Notes (Signed)
[facility] [address] [place] [address]   CLINIC:  Medical Oncology/Hematology  CONSULT NOTE  Patient Care Team: Patient, No Pcp Per as PCP - General (General Practice) Dishmon, Garwin Brothers, RN as Oncology Nurse Navigator (Oncology)  CHIEF COMPLAINTS/PURPOSE OF CONSULTATION:  Evaluation of metastatic colon cancer to liver  HISTORY OF PRESENTING ILLNESS:  [name] 59 y.o. female is here because of evaluation of metastatic colon cancer to liver, at the request of Dr. Truitt Merle. She presented to the hospital with abdominal pain, found to have colon perforation on imaging. She had a sigmoid colectomy with end colostomy by Dr. Gurney Maxin on 04/29/2020 for a perforated colon.  Today she is accompanied by her husband, [name], and she reports feeling better today. She complains of pain around the incision site, but otherwise denies having any other pain. She is ambulatory at home without any aids, though she will get tired after prolonged activity. Her appetite is improving and she is eating her meals and drinking 3 cans of Ensure daily. She reports that prior to her ED visit, she had chronic constipation and did not seek medical attention until it suddenly worsened. She denies having any numbness or tingling. She denies having any cardiac issues, MI's or CVA's. She is on her last dose of Augmentin today. She takes oxycodone 5 mg every 6 hours and her pain is well controlled. Her drain is down to 10 cc per day. She reports having a large complex ovarian cyst in 02/2016 which was removed by Dr. Glo Herring.  She has never had a colonoscopy before. Her mother had some kind of cancer; her maternal uncle had lung cancer d/t smoking; her maternal aunt had lung cancer d/t smoking. She used to work for [employer] in [place]. She smoked for 3-4 years after getting divorced.  She has a follow-up with Dr. Kieth Brightly on 12/1.  MEDICAL HISTORY:  Past Medical History:   Diagnosis Date  . Anemia   . Depression   . Hypertension     SURGICAL HISTORY: Past Surgical History:  Procedure Laterality Date  . ABDOMINAL HYSTERECTOMY    . APPLICATION OF WOUND VAC N/A 04/29/2020   Procedure: APPLICATION OF WOUND VAC;  Surgeon: Kinsinger, Arta Bruce, MD;  Location: [facility];  Service: General;  Laterality: N/A;  . ENDOMETRIAL ABLATION    . LAPAROTOMY N/A 04/29/2020   Procedure: EXPLORATORY LAPAROTOMY;  Surgeon: Kieth Brightly Arta Bruce, MD;  Location: [facility];  Service: General;  Laterality: N/A;  . PARTIAL COLECTOMY N/A 04/29/2020   Procedure: PARTIAL COLECTOMY WITH END COLOSTOMY;  Surgeon: Kieth Brightly Arta Bruce, MD;  Location: [facility];  Service: General;  Laterality: N/A;  . SALPINGOOPHORECTOMY Left 03/31/2016   Procedure: LEFT SALPINGO OOPHORECTOMY WITH FROZEN SECTION,  ABDOMINAL HYSTERECTOMY WITH BILATERAL SALPINGO-OOPHORECTOMY;  Surgeon: Jonnie Kind, MD;  Location: [facility];  Service: Gynecology;  Laterality: Left;    SOCIAL HISTORY: Social History   Socioeconomic History  . Marital status: Divorced    Spouse name: Not on file  . Number of children: Not on file  . Years of education: Not on file  . Highest education level: Not on file  Occupational History  . Not on file  Tobacco Use  . Smoking status: Former Smoker    Packs/day: 0.50    Years: 5.00    Pack years: 2.50    Types: Cigarettes    Quit date: 03/28/2015    Years since quitting: 5.1  . Smokeless tobacco:  Never Used  Substance and Sexual Activity  . Alcohol use: No  . Drug use: No  . Sexual activity: Yes    Partners: Male    Birth control/protection: None  Other Topics Concern  . Not on file  Social History Narrative  . Not on file   Social Determinants of Health   Financial Resource Strain: Low Risk   . Difficulty of Paying Living Expenses: Not very hard  Food Insecurity: No Food Insecurity  . Worried About Charity fundraiser in the Last Year: Never true  . Ran Out of Food in the  Last Year: Never true  Transportation Needs: No Transportation Needs  . Lack of Transportation (Medical): No  . Lack of Transportation (Non-Medical): No  Physical Activity: Insufficiently Active  . Days of Exercise per Week: 2 days  . Minutes of Exercise per Session: 30 min  Stress: Stress Concern Present  . Feeling of Stress : To some extent  Social Connections: Moderately Integrated  . Frequency of Communication with Friends and Family: More than three times a week  . Frequency of Social Gatherings with Friends and Family: Once a week  . Attends Religious Services: More than 4 times per year  . Active Member of Clubs or Organizations: No  . Attends Archivist Meetings: Never  . Marital Status: Living with partner  Intimate Partner Violence: Not At Risk  . Fear of Current or Ex-Partner: No  . Emotionally Abused: No  . Physically Abused: No  . Sexually Abused: No    FAMILY HISTORY: Family History  Problem Relation Age of Onset  . Hypertension Mother   . Diabetes Mother   . Cirrhosis Father     ALLERGIES:  has No Known Allergies.  MEDICATIONS:  Current Outpatient Medications  Medication Sig Dispense Refill  . acetaminophen (TYLENOL) 500 MG tablet Take 2 tablets (1,000 mg total) by mouth every 6 (six) hours as needed for mild pain, fever or headache.    . fluticasone (FLONASE) 50 MCG/ACT nasal spray Place 1-2 sprays into both nostrils daily as needed for allergies or rhinitis.    . hydrOXYzine (VISTARIL) 25 MG capsule Take 1 capsule (25 mg total) by mouth 3 (three) times daily as needed. Prn anxiety 60 capsule 2  . methocarbamol (ROBAXIN) 500 MG tablet Take 2 tablets (1,000 mg total) by mouth every 8 (eight) hours as needed for muscle spasms. 60 tablet 1  . oxyCODONE (OXY IR/ROXICODONE) 5 MG immediate release tablet Take 1-2 tablets (5-10 mg total) by mouth every 4 (four) hours as needed for moderate pain. 30 tablet 0  . PARoxetine (PAXIL) 40 MG tablet Take 1 tablet  (40 mg total) by mouth every morning. 90 tablet 3  . polyethylene glycol (MIRALAX / GLYCOLAX) packet Take 17 g by mouth every other day.    . triamterene-hydrochlorothiazide (DYAZIDE) 37.5-25 MG capsule Take 1 each (1 capsule total) by mouth daily. 30 capsule 11   No current facility-administered medications for this visit.    REVIEW OF SYSTEMS:   Review of Systems  Constitutional: Positive for appetite change (75%) and fatigue (50%).  HENT:   Positive for hearing loss.   Gastrointestinal: Positive for abdominal pain (4/10 abdominal soreness and pain around incision site).  Neurological: Negative for numbness.  All other systems reviewed and are negative.     PHYSICAL EXAMINATION: ECOG PERFORMANCE STATUS: 1 - Symptomatic but completely ambulatory  Vitals:   05/16/20 0832  BP: (!) 141/72  Pulse: 93  Resp:  17  Temp: (!) 97.1 F (36.2 C)  SpO2: 98%   Filed Weights   05/16/20 0832  Weight: 127 lb 12.8 oz (58 kg)   Physical Exam Vitals reviewed.  Constitutional:      Appearance: Normal appearance.  Cardiovascular:     Rate and Rhythm: Normal rate and regular rhythm.     Pulses: Normal pulses.     Heart sounds: Normal heart sounds.  Pulmonary:     Effort: Pulmonary effort is normal.     Breath sounds: Normal breath sounds.  Abdominal:     Palpations: Abdomen is soft. There is hepatomegaly (1 fingerbreath below ribs). There is no mass.     Tenderness: There is no abdominal tenderness.       Comments: Colostomy bag  Neurological:     General: No focal deficit present.     Mental Status: She is alert and oriented to person, place, and time.  Psychiatric:        Mood and Affect: Mood normal.        Behavior: Behavior normal.      LABORATORY DATA:  I have reviewed the data as listed CBC Latest Ref Rng & Units 05/07/2020 05/04/2020 05/03/2020  WBC 4.0 - 10.5 K/uL 11.3(H) 11.1(H) 14.4(H)  Hemoglobin 12.0 - 15.0 g/dL 9.0(L) 8.2(L) 7.8(L)  Hematocrit 36 - 46 % 30.7(L)  28.2(L) 26.4(L)  Platelets 150 - 400 K/uL 456(H) 448(H) 438(H)   CMP Latest Ref Rng & Units 05/07/2020 05/06/2020 05/04/2020  Glucose 70 - 99 mg/dL 105(H) - 115(H)  BUN 6 - 20 mg/dL 8 - <5(L)  Creatinine 0.44 - 1.00 mg/dL 0.62 0.68 0.64  Sodium 135 - 145 mmol/L 132(L) - 135  Potassium 3.5 - 5.1 mmol/L 4.0 - 4.7  Chloride 98 - 111 mmol/L 96(L) - 99  CO2 22 - 32 mmol/L 26 - 28  Calcium 8.9 - 10.3 mg/dL 8.7(L) - 8.5(L)  Total Protein 6.5 - 8.1 g/dL - - -  Total Bilirubin 0.3 - 1.2 mg/dL - - -  Alkaline Phos 38 - 126 U/L - - -  AST 15 - 41 U/L - - -  ALT 0 - 44 U/L - - -   Surgical pathology ([id number]) on 05/03/2020: Left liver needle core biopsy: adenocarcinoma.  RADIOGRAPHIC STUDIES: I have personally reviewed the radiological images as listed and agreed with the findings in the report. CT CHEST W CONTRAST  Result Date: 05/02/2020 CLINICAL DATA:  Colorectal cancer staging evaluation EXAM: CT CHEST WITH CONTRAST TECHNIQUE: Multidetector CT imaging of the chest was performed during intravenous contrast administration. CONTRAST:  59m OMNIPAQUE IOHEXOL 300 MG/ML  SOLN COMPARISON:  CT abdomen and pelvis 04/28/2020 FINDINGS: Cardiovascular: Heart size is normal. No pericardial effusion. Normal caliber thoracic aorta. Normal appearance of central pulmonary vasculature. Limited on venous phase assessment. Mediastinum/Nodes: Thoracic inlet structures are normal. No axillary lymphadenopathy. No thoracic inlet lymphadenopathy. No mediastinal lymphadenopathy. No hilar lymphadenopathy. Lungs/Pleura: Tiny pulmonary nodule on image 97 of series 5 measuring 2 mm in the RIGHT middle lobe. Basilar atelectasis.  Airways are patent. Upper Abdomen: Small amount of pneumoperitoneum over the liver diminished compared to the previous CT of the abdomen and pelvis in this patient now postoperative for resection of colonic mass/perforated colon cancer. Numerous hepatic lesions are noted which are unchanged compared  to recent imaging. Findings are more suggestive of hepatic metastatic disease than abscess. Musculoskeletal: No acute musculoskeletal process. Spinal degenerative changes. IMPRESSION: 1. No definite evidence of metastatic disease to the chest with  tiny 2 mm RIGHT middle lobe pulmonary nodule. Attention on follow-up. 2. Small amount of pneumoperitoneum over the liver diminished compared to the previous CT of the abdomen and pelvis in this patient now postoperative for resection of colonic mass/perforated colon cancer. Numerous low-attenuation lesions in the liver are more compatible with hepatic metastatic disease rather than hepatic abscess. These are stable compared to recent imaging. Ultrasound with tissue sampling could be helpful as warranted for further assessment. 3. Aortic atherosclerosis. Aortic Atherosclerosis (ICD10-I70.0). Electronically Signed   By: Zetta Bills M.D.   On: 05/02/2020 21:17   CT ABDOMEN PELVIS W CONTRAST  Result Date: 05/07/2020 CLINICAL DATA:  Suspected abdominal abscess/infection, recent exploratory laparotomy with partial colectomy and colostomy on 04/29/2020 EXAM: CT ABDOMEN AND PELVIS WITH CONTRAST TECHNIQUE: Multidetector CT imaging of the abdomen and pelvis was performed using the standard protocol following bolus administration of intravenous contrast. Sagittal and coronal MPR images reconstructed from axial data set. CONTRAST:  143m OMNIPAQUE IOHEXOL 300 MG/ML SOLN IV. Dilute oral contrast. COMPARISON:  04/28/2020 FINDINGS: Lower chest: Minimal subsegmental atelectasis at RIGHT lower lobe. LEFT lung base clear. Hepatobiliary: Multiple ill-defined lesions throughout the liver most suspicious for hepatic metastases. Largest of these are RIGHT lobe 3.9 cm diameter image 20, lateral segment LEFT lobe 2.6 cm diameter, and inferior RIGHT lobe confluent lesion 4.2 x 3.7 cm. No definite gallbladder abnormalities. Pancreas: Normal appearance Spleen: Normal appearance  Adrenals/Urinary Tract: Adrenal glands, kidneys, ureters, and bladder normal appearance Stomach/Bowel: Colostomy LEFT lower quadrant with Hartmann pouch. Increased stool in colon. Stomach unremarkable. Dilated small bowel loops are seen throughout the abdomen and pelvis, question postoperative ileus. Small duodenal diverticulum. Vascular/Lymphatic: Aorta normal caliber. Vascular structures patent. No adenopathy. Reproductive: Uterus surgically absent. Nonvisualization of ovaries. Other: Surgical drain in pelvis. Small foci of gas and some fluid in pelvis at surgical bed. No discrete/defined at abscess collection identified though a questionable 3.0 x 2.4 cm developing collection is seen in the pelvis image 68. No hernia. No free air. Musculoskeletal: Unremarkable IMPRESSION: Post distal colonic resection with sigmoid colostomy and Hartmann pouch. Previously identified distal sigmoid tumor no longer seen. Small amount of fluid and tiny foci of gas at surgical bed with questionable developing 3.0 x 2.4 cm diameter collection which could be sterile or infected, not yet a mature/defined collection. Suspected hepatic metastases. Electronically Signed   By: MLavonia DanaM.D.   On: 05/07/2020 14:09   CT ABDOMEN PELVIS W CONTRAST  Result Date: 04/28/2020 CLINICAL DATA:  Abdominal pain, fever EXAM: CT ABDOMEN AND PELVIS WITH CONTRAST TECHNIQUE: Multidetector CT imaging of the abdomen and pelvis was performed using the standard protocol following bolus administration of intravenous contrast. CONTRAST:  817mOMNIPAQUE IOHEXOL 300 MG/ML  SOLN COMPARISON:  03/09/2016 FINDINGS: Lower chest: No acute abnormality. Hepatobiliary: Numerous low-density masses throughout the liver are new since prior study. Index measurement of a right hepatic lesion on image 17 measures 3.7 cm in greatest diameter. These are concerning for metastases. Gallbladder unremarkable. Pancreas: No focal abnormality or ductal dilatation. Spleen: No focal  abnormality.  Normal size. Adrenals/Urinary Tract: No adrenal abnormality. No focal renal abnormality. No stones or hydronephrosis. Urinary bladder is unremarkable. Stomach/Bowel: Extensive inflammation noted around the sigmoid colon with locules of extraluminal gas throughout the abdomen and pelvis and large gas and fluid collection adjacent to the sigmoid colon compatible with abscess. Circumferential wall thickening seen within the mid sigmoid colon. Large stool burden throughout the colon. Stomach and small bowel decompressed, unremarkable. Vascular/Lymphatic: No evidence  of aneurysm or adenopathy. Reproductive: Prior hysterectomy.  No adnexal masses. Other: Fluid and gas collection in the pelvis mentioned above measures 8.7 x 4.5 cm. Free air in the abdomen and pelvis. Musculoskeletal: No acute bony abnormality. IMPRESSION: Evidence of bowel perforation in the sigmoid colon. There is for pharyngeal wall thickening within the mid sigmoid colon with surrounding extraluminal gas and fluid collections compatible with abscess is. Free air is also noted in the upper abdomen adjacent to the liver. This could reflect perforated diverticulitis or perforated cancer. Numerous low-density masses throughout the liver. Appearance is concerning for metastases. Hepatic abscesses cannot be completely excluded but felt less likely given the Sheer number. Large stool burden throughout the colon. These results were called by telephone at the time of interpretation on 04/28/2020 at 10:26 pm to provider MATTHEW TRIFAN , who verbally acknowledged these results. Electronically Signed   By: Rolm Baptise M.D.   On: 04/28/2020 22:29   US BIOPSY (LIVER)  Result Date: 05/03/2020 INDICATION: Liver metastases, suspect colon cancer EXAM: ULTRASOUND LEFT LIVER METASTASIS CORE BIOPSY MEDICATIONS: 1% LIDOCAINE LOCAL ANESTHESIA/SEDATION: Moderate (conscious) sedation was employed during this procedure. A total of Versed 1.0 mg and Fentanyl 50  mcg was administered intravenously. Moderate Sedation Time: 10 minutes. The patient's level of consciousness and vital signs were monitored continuously by radiology nursing throughout the procedure under my direct supervision. FLUOROSCOPY TIME:  Fluoroscopy Time: NONE. COMPLICATIONS: None immediate. PROCEDURE: Informed written consent was obtained from the patient after a thorough discussion of the procedural risks, benefits and alternatives. All questions were addressed. Maximal Sterile Barrier Technique was utilized including caps, mask, sterile gowns, sterile gloves, sterile drape, hand hygiene and skin antiseptic. A timeout was performed prior to the initiation of the procedure. Previous imaging reviewed. Preliminary ultrasound performed. A subtle hypoechoic lesion in the anterior left lobe was localized and marked in the epigastric region. Under sterile conditions and local anesthesia, the 17 gauge coaxial guide was advanced to the lesion. Needle position confirmed with ultrasound. 18 gauge core biopsies obtained (3). Samples were intact and non fragmented. Needle tract occluded with Gel-Foam. Postprocedure imaging demonstrates no hemorrhage or hematoma. Patient tolerated biopsy well. IMPRESSION: Successful ultrasound left hepatic metastasis 18 gauge core biopsies Electronically Signed   By: Jerilynn Mages.  Shick M.D.   On: 05/03/2020 13:22    ASSESSMENT:  1.  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 -CT chest on 05/02/2020 with no evidence of pulmonary metastatic disease. -CTAP on 05/07/2020 with multiple lesions throughout the liver, largest in the right lobe measuring 3.9 cm, lateral segment left lobe measuring 2.6 cm and inferior right lobe confluent lesion 4.2 x 3.7 cm.  No  lymphadenopathy. -CEA on 05/02/2020-60.2.  2.  Iron deficiency anemia: -Feraheme on 04/30/2020 and 05/06/2020.  3.  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 -Smoked for 3 to 4 years and quit. -Mother had cancer, type unknown.  Maternal uncle had lung cancer.  Maternal aunt had lung cancer.    PLAN:  1.  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 -Recommend NGS testing for RAS, BRAF and other mutations. -We will request for port placement. -We talked about chemotherapy with palliative intent.  We have offered FOLFOX with bevacizumab as first-line  treatment. -We discussed chemotherapy schedule and side effects. -She will follow up with Dr. Kieth Brightly for JP drain removal. -RTC after port placement to initiate first cycle of chemotherapy.  2.  Iron deficiency anemia: -Latest CBC on 05/07/2020 showed improvement in hemoglobin to 9 with MCV 71.6. -We will consider checking ferritin and iron panel prior to start of therapy.  3.  Abdominal pain: -She reported some pain at the surgical incision site. -We will give her oxycodone 5 mg every 6 hours as needed.  All questions were answered. The patient knows to call the clinic with any problems, questions or concerns.   Derek Jack, MD, 05/16/20 9:47 AM  [place] [phone]   I, Milinda Antis, am acting as a scribe for Dr. Sanda Linger.  I, Derek Jack MD, have reviewed the above documentation for accuracy and completeness, and I agree with the above.

## 2020-05-16 NOTE — Progress Notes (Signed)
Foundation One requested per Dr. Delton Coombes

## 2020-05-17 DIAGNOSIS — Z7189 Other specified counseling: Secondary | ICD-10-CM | POA: Insufficient documentation

## 2020-05-17 NOTE — Progress Notes (Signed)
START ON PATHWAY REGIMEN - Colorectal     A cycle is every 14 days:     Bevacizumab-xxxx      Oxaliplatin      Leucovorin      Fluorouracil      Fluorouracil   **Always confirm dose/schedule in your pharmacy ordering system**  Patient Characteristics: Distant Metastases, Nonsurgical Candidate, KRAS/NRAS Mutation Positive/Unknown (BRAF V600 Wild-Type/Unknown), Standard Cytotoxic Therapy, First Line Standard Cytotoxic Therapy, Bevacizumab Eligible, PS = 0,1 Tumor Location: Colon Therapeutic Status: Distant Metastases Microsatellite/Mismatch Repair Status: MSS/pMMR BRAF Mutation Status: Awaiting Test Results KRAS/NRAS Mutation Status: Awaiting Test Results Standard Cytotoxic Line of Therapy: First Line Standard Cytotoxic Therapy ECOG Performance Status: 1 Bevacizumab Eligibility: Eligible Intent of Therapy: Non-Curative / Palliative Intent, Discussed with Patient 

## 2020-05-22 ENCOUNTER — Encounter (HOSPITAL_COMMUNITY): Payer: Self-pay

## 2020-05-22 ENCOUNTER — Other Ambulatory Visit (HOSPITAL_COMMUNITY): Payer: Self-pay

## 2020-05-22 DIAGNOSIS — C189 Malignant neoplasm of colon, unspecified: Secondary | ICD-10-CM

## 2020-05-22 NOTE — Progress Notes (Signed)
Phone call received back from [place], [place] with [facility] that they will place the patient's Port-A-Cath. Susan Martin to call clinic with that appt date.

## 2020-05-22 NOTE — Progress Notes (Signed)
Call placed to [facility]. Nursing staff expects delay in port placement and recommends IR port placement. Dr. Delton Coombes aware and orders placed for IR guided port insertion.

## 2020-05-24 ENCOUNTER — Ambulatory Visit: Payer: Self-pay | Admitting: General Surgery

## 2020-05-25 ENCOUNTER — Other Ambulatory Visit (HOSPITAL_COMMUNITY)
Admission: RE | Admit: 2020-05-25 | Discharge: 2020-05-25 | Disposition: A | Discharge status: Home / Self Care | Payer: HRSA Program | Source: Ambulatory Visit | Attending: General Surgery | Admitting: General Surgery

## 2020-05-25 DIAGNOSIS — Z01812 Encounter for preprocedural laboratory examination: Secondary | ICD-10-CM | POA: Diagnosis present

## 2020-05-25 DIAGNOSIS — Z20822 Contact with and (suspected) exposure to covid-19: Secondary | ICD-10-CM | POA: Diagnosis not present

## 2020-05-26 LAB — SARS CORONAVIRUS 2 (TAT 6-24 HRS): SARS Coronavirus 2: NEGATIVE

## 2020-05-27 ENCOUNTER — Other Ambulatory Visit: Payer: Self-pay

## 2020-05-27 ENCOUNTER — Encounter (HOSPITAL_BASED_OUTPATIENT_CLINIC_OR_DEPARTMENT_OTHER): Payer: Self-pay | Admitting: General Surgery

## 2020-05-27 NOTE — Anesthesia Preprocedure Evaluation (Addendum)
Anesthesia Evaluation  Patient identified by MRN, date of birth, ID band Patient awake    Reviewed: Allergy & Precautions, NPO status , Patient's Chart, lab work & pertinent test results  Airway Mallampati: II  TM Distance: >3 FB Neck ROM: Full    Dental no notable dental hx. (+) Teeth Intact, Dental Advisory Given   Pulmonary neg pulmonary ROS, former smoker,    Pulmonary exam normal breath sounds clear to auscultation       Cardiovascular Exercise Tolerance: Good hypertension, Pt. on medications Normal cardiovascular exam Rhythm:Regular Rate:Normal     Neuro/Psych Anxiety Depression    GI/Hepatic negative GI ROS, Neg liver ROS,   Endo/Other  negative endocrine ROS  Renal/GU negative Renal ROSK+ 3.7 Cr 0.60     Musculoskeletal negative musculoskeletal ROS (+)   Abdominal   Peds  Hematology  (+) anemia , Lab Results      Component                Value               Date                      WBC                      11.3 (H)            05/07/2020                HGB                      9.0 (L)             05/07/2020                HCT                      30.7 (L)            05/07/2020                MCV                      71.6 (L)            05/07/2020                PLT                      456 (H)             05/07/2020              Anesthesia Other Findings   Reproductive/Obstetrics                           Anesthesia Physical Anesthesia Plan  ASA: III  Anesthesia Plan: General   Post-op Pain Management:    Induction: Intravenous  PONV Risk Score and Plan: 4 or greater and Treatment may vary due to age or medical condition, Ondansetron, Dexamethasone and Midazolam  Airway Management Planned: LMA  Additional Equipment: None  Intra-op Plan:   Post-operative Plan:   Informed Consent: I have reviewed the patients History and Physical, chart, labs and discussed the  procedure including the risks, benefits and alternatives for the proposed anesthesia with the patient or authorized representative who has indicated his/her understanding and acceptance.  Dental advisory given  Plan Discussed with:   Anesthesia Plan Comments: (GA  w LMA)       Anesthesia Quick Evaluation

## 2020-05-27 NOTE — Progress Notes (Signed)
Spoke w/ via phone for pre-op interview--- PT Lab needs dos---- Istat and EKG               Lab results------ no COVID test ------ done 05-25-2020 negative results in epic Arrive at ------- 0930 NPO after MN NO Solid Food.  Clear liquids from MN until--- 0830 Medications to take morning of surgery ----- Paxil Diabetic medication ----- n/a Patient Special Instructions ----- advised pt to bring colostomy supplies if need  Pre-Op special Istructions ----- n/a Patient verbalized understanding of instructions that were given at this phone interview. Patient denies shortness of breath, chest pain, fever, cough at this phone interview.

## 2020-05-28 ENCOUNTER — Encounter (HOSPITAL_BASED_OUTPATIENT_CLINIC_OR_DEPARTMENT_OTHER)
Admission: RE | Disposition: A | Discharge status: Home / Self Care | Payer: Self-pay | Source: Home / Self Care | Attending: General Surgery

## 2020-05-28 ENCOUNTER — Ambulatory Visit (HOSPITAL_BASED_OUTPATIENT_CLINIC_OR_DEPARTMENT_OTHER): Payer: Medicaid Other | Admitting: Anesthesiology

## 2020-05-28 ENCOUNTER — Encounter (HOSPITAL_BASED_OUTPATIENT_CLINIC_OR_DEPARTMENT_OTHER): Payer: Self-pay | Admitting: General Surgery

## 2020-05-28 ENCOUNTER — Ambulatory Visit (HOSPITAL_COMMUNITY): Payer: Medicaid Other

## 2020-05-28 ENCOUNTER — Encounter (HOSPITAL_COMMUNITY): Payer: Self-pay

## 2020-05-28 ENCOUNTER — Ambulatory Visit (HOSPITAL_BASED_OUTPATIENT_CLINIC_OR_DEPARTMENT_OTHER)
Admission: RE | Admit: 2020-05-28 | Discharge: 2020-05-28 | Disposition: A | Discharge status: Home / Self Care | Payer: Medicaid Other | Attending: General Surgery | Admitting: General Surgery

## 2020-05-28 DIAGNOSIS — Z87891 Personal history of nicotine dependence: Secondary | ICD-10-CM | POA: Diagnosis not present

## 2020-05-28 DIAGNOSIS — C787 Secondary malignant neoplasm of liver and intrahepatic bile duct: Secondary | ICD-10-CM | POA: Diagnosis not present

## 2020-05-28 DIAGNOSIS — Z95828 Presence of other vascular implants and grafts: Secondary | ICD-10-CM

## 2020-05-28 DIAGNOSIS — Z79899 Other long term (current) drug therapy: Secondary | ICD-10-CM | POA: Insufficient documentation

## 2020-05-28 DIAGNOSIS — Z9049 Acquired absence of other specified parts of digestive tract: Secondary | ICD-10-CM | POA: Diagnosis not present

## 2020-05-28 DIAGNOSIS — C189 Malignant neoplasm of colon, unspecified: Secondary | ICD-10-CM | POA: Diagnosis present

## 2020-05-28 DIAGNOSIS — Z85038 Personal history of other malignant neoplasm of large intestine: Secondary | ICD-10-CM | POA: Insufficient documentation

## 2020-05-28 HISTORY — DX: Secondary malignant neoplasm of liver and intrahepatic bile duct: C78.7

## 2020-05-28 HISTORY — DX: Solitary pulmonary nodule: R91.1

## 2020-05-28 HISTORY — DX: Iron deficiency anemia, unspecified: D50.9

## 2020-05-28 HISTORY — PX: PORTACATH PLACEMENT: SHX2246

## 2020-05-28 HISTORY — DX: Anxiety disorder, unspecified: F41.9

## 2020-05-28 HISTORY — DX: Malignant neoplasm of colon, unspecified: C18.9

## 2020-05-28 LAB — POCT I-STAT, CHEM 8
BUN: 15 mg/dL (ref 6–20)
Calcium, Ion: 1.22 mmol/L (ref 1.15–1.40)
Chloride: 100 mmol/L (ref 98–111)
Creatinine, Ser: 0.6 mg/dL (ref 0.44–1.00)
Glucose, Bld: 106 mg/dL — ABNORMAL HIGH (ref 70–99)
HCT: 38 % (ref 36.0–46.0)
Hemoglobin: 12.9 g/dL (ref 12.0–15.0)
Potassium: 3.7 mmol/L (ref 3.5–5.1)
Sodium: 137 mmol/L (ref 135–145)
TCO2: 26 mmol/L (ref 22–32)

## 2020-05-28 SURGERY — INSERTION, TUNNELED CENTRAL VENOUS DEVICE, WITH PORT
Anesthesia: General | Site: Chest | Laterality: Right

## 2020-05-28 MED ORDER — 0.9 % SODIUM CHLORIDE (POUR BTL) OPTIME
TOPICAL | Status: DC | PRN
  Administered 2020-05-28: 500 mL

## 2020-05-28 MED ORDER — FENTANYL CITRATE (PF) 100 MCG/2ML IJ SOLN: 25.0000 ug | INTRAMUSCULAR | Status: DC | PRN

## 2020-05-28 MED ORDER — ONDANSETRON HCL 4 MG/2ML IJ SOLN
INTRAMUSCULAR | Status: AC
  Filled 2020-05-28: qty 2

## 2020-05-28 MED ORDER — EPHEDRINE 5 MG/ML INJ
INTRAVENOUS | Status: AC
  Filled 2020-05-28: qty 10

## 2020-05-28 MED ORDER — ONDANSETRON HCL 4 MG/2ML IJ SOLN
INTRAMUSCULAR | Status: DC | PRN
  Administered 2020-05-28: 4 mg via INTRAVENOUS

## 2020-05-28 MED ORDER — DEXAMETHASONE SODIUM PHOSPHATE 4 MG/ML IJ SOLN
INTRAMUSCULAR | Status: DC | PRN
  Administered 2020-05-28: 10 mg via INTRAVENOUS

## 2020-05-28 MED ORDER — FENTANYL CITRATE (PF) 100 MCG/2ML IJ SOLN
INTRAMUSCULAR | Status: AC
  Filled 2020-05-28: qty 2

## 2020-05-28 MED ORDER — PROPOFOL 10 MG/ML IV BOLUS
INTRAVENOUS | Status: DC | PRN
  Administered 2020-05-28 (×2): 50 mg via INTRAVENOUS
  Administered 2020-05-28: 150 mg via INTRAVENOUS

## 2020-05-28 MED ORDER — FENTANYL CITRATE (PF) 100 MCG/2ML IJ SOLN
INTRAMUSCULAR | Status: DC | PRN
  Administered 2020-05-28 (×4): 25 ug via INTRAVENOUS

## 2020-05-28 MED ORDER — DEXAMETHASONE SODIUM PHOSPHATE 10 MG/ML IJ SOLN
INTRAMUSCULAR | Status: AC
  Filled 2020-05-28: qty 1

## 2020-05-28 MED ORDER — LACTATED RINGERS IV SOLN: INTRAVENOUS | Status: DC

## 2020-05-28 MED ORDER — IBUPROFEN 800 MG PO TABS: 800.0000 mg | ORAL_TABLET | Freq: Three times a day (TID) | ORAL | 0 refills | Status: DC | PRN

## 2020-05-28 MED ORDER — BUPIVACAINE-EPINEPHRINE 0.5% -1:200000 IJ SOLN
INTRAMUSCULAR | Status: DC | PRN
  Administered 2020-05-28: 20 mL

## 2020-05-28 MED ORDER — SODIUM CHLORIDE 0.9 % IV SOLN
INTRAVENOUS | Status: DC | PRN
  Administered 2020-05-28: 5 mL

## 2020-05-28 MED ORDER — ACETAMINOPHEN 500 MG PO TABS
ORAL_TABLET | ORAL | Status: AC
  Filled 2020-05-28: qty 2

## 2020-05-28 MED ORDER — CEFAZOLIN SODIUM-DEXTROSE 2-4 GM/100ML-% IV SOLN
2.0000 g | INTRAVENOUS | Status: AC
  Administered 2020-05-28: 2 g via INTRAVENOUS

## 2020-05-28 MED ORDER — ACETAMINOPHEN 500 MG PO TABS
1000.0000 mg | ORAL_TABLET | ORAL | Status: AC
  Administered 2020-05-28: 1000 mg via ORAL

## 2020-05-28 MED ORDER — ONDANSETRON HCL 4 MG/2ML IJ SOLN: 4.0000 mg | Freq: Once | INTRAMUSCULAR | Status: DC | PRN

## 2020-05-28 MED ORDER — ENSURE PRE-SURGERY PO LIQD: 296.0000 mL | Freq: Once | ORAL | Status: DC

## 2020-05-28 MED ORDER — CHLORHEXIDINE GLUCONATE CLOTH 2 % EX PADS: 6.0000 | MEDICATED_PAD | Freq: Once | CUTANEOUS | Status: DC

## 2020-05-28 MED ORDER — EPHEDRINE SULFATE 50 MG/ML IJ SOLN
INTRAMUSCULAR | Status: DC | PRN
  Administered 2020-05-28 (×2): 10 mg via INTRAVENOUS
  Administered 2020-05-28: 20 mg via INTRAVENOUS

## 2020-05-28 MED ORDER — PROPOFOL 10 MG/ML IV BOLUS
INTRAVENOUS | Status: AC
  Filled 2020-05-28: qty 20

## 2020-05-28 MED ORDER — LIDOCAINE HCL (CARDIAC) PF 100 MG/5ML IV SOSY
PREFILLED_SYRINGE | INTRAVENOUS | Status: DC | PRN
  Administered 2020-05-28: 100 mg via INTRAVENOUS

## 2020-05-28 MED ORDER — CELECOXIB 200 MG PO CAPS
ORAL_CAPSULE | ORAL | Status: AC
  Filled 2020-05-28: qty 2

## 2020-05-28 MED ORDER — CELECOXIB 200 MG PO CAPS
400.0000 mg | ORAL_CAPSULE | ORAL | Status: AC
  Administered 2020-05-28: 400 mg via ORAL

## 2020-05-28 MED ORDER — SODIUM CHLORIDE 0.9 % IV SOLN
Freq: Once | INTRAVENOUS | Status: DC
  Filled 2020-05-28 (×2): qty 1.2

## 2020-05-28 MED ORDER — CEFAZOLIN SODIUM-DEXTROSE 2-4 GM/100ML-% IV SOLN
INTRAVENOUS | Status: AC
  Filled 2020-05-28: qty 100

## 2020-05-28 MED ORDER — HYDROCODONE-ACETAMINOPHEN 5-325 MG PO TABS: 1.0000 | ORAL_TABLET | Freq: Four times a day (QID) | ORAL | 0 refills | Status: DC | PRN

## 2020-05-28 MED ORDER — ACETAMINOPHEN 10 MG/ML IV SOLN: 1000.0000 mg | Freq: Once | INTRAVENOUS | Status: DC | PRN

## 2020-05-28 SURGICAL SUPPLY — 37 items
ADH SKN CLS APL DERMABOND .7 (GAUZE/BANDAGES/DRESSINGS) ×1
APL PRP STRL LF DISP 70% ISPRP (MISCELLANEOUS) ×1
BAG DECANTER FOR FLEXI CONT (MISCELLANEOUS) ×2 IMPLANT
BLADE SURG 11 STRL SS (BLADE) ×2 IMPLANT
BLADE SURG 15 STRL LF DISP TIS (BLADE) ×1 IMPLANT
BLADE SURG 15 STRL SS (BLADE) ×2
CHLORAPREP W/TINT 26 (MISCELLANEOUS) ×2 IMPLANT
COVER BACK TABLE 60X90IN (DRAPES) ×2 IMPLANT
COVER MAYO STAND STRL (DRAPES) ×2 IMPLANT
COVER PROBE U/S 5X48 (MISCELLANEOUS) ×2 IMPLANT
COVER WAND RF STERILE (DRAPES) ×2 IMPLANT
DERMABOND ADVANCED (GAUZE/BANDAGES/DRESSINGS) ×1
DERMABOND ADVANCED .7 DNX12 (GAUZE/BANDAGES/DRESSINGS) ×1 IMPLANT
DRAPE C-ARM 42X120 X-RAY (DRAPES) ×2 IMPLANT
DRAPE LAPAROTOMY TRNSV 102X78 (DRAPES) ×2 IMPLANT
DRAPE UTILITY XL STRL (DRAPES) ×2 IMPLANT
ELECT REM PT RETURN 9FT ADLT (ELECTROSURGICAL) ×2
ELECTRODE REM PT RTRN 9FT ADLT (ELECTROSURGICAL) ×1 IMPLANT
GLOVE BIOGEL PI IND STRL 7.0 (GLOVE) ×1 IMPLANT
GLOVE BIOGEL PI INDICATOR 7.0 (GLOVE) ×1
GLOVE SURG SS PI 7.0 STRL IVOR (GLOVE) ×2 IMPLANT
GOWN STRL REUS W/TWL LRG LVL3 (GOWN DISPOSABLE) ×2 IMPLANT
KIT PORT POWER 8FR ISP CVUE (Port) ×2 IMPLANT
KIT TURNOVER CYSTO (KITS) ×2 IMPLANT
NEEDLE HYPO 22GX1.5 SAFETY (NEEDLE) ×2 IMPLANT
NS IRRIG 1000ML POUR BTL (IV SOLUTION) ×2 IMPLANT
PACK BASIN DAY SURGERY FS (CUSTOM PROCEDURE TRAY) ×2 IMPLANT
PAD ARMBOARD 7.5X6 YLW CONV (MISCELLANEOUS) ×2 IMPLANT
PENCIL SMOKE EVACUATOR (MISCELLANEOUS) ×2 IMPLANT
SUT MNCRL AB 4-0 PS2 18 (SUTURE) ×2 IMPLANT
SUT PROLENE 2 0 SH DA (SUTURE) ×2 IMPLANT
SUT VIC AB 3-0 SH 27 (SUTURE) ×2
SUT VIC AB 3-0 SH 27X BRD (SUTURE) ×1 IMPLANT
SYR 20ML LL LF (SYRINGE) ×2 IMPLANT
SYR 5ML LL (SYRINGE) ×2 IMPLANT
SYR CONTROL 10ML LL (SYRINGE) ×2 IMPLANT
TOWEL OR 17X26 10 PK STRL BLUE (TOWEL DISPOSABLE) ×2 IMPLANT

## 2020-05-28 NOTE — Discharge Instructions (Signed)
Post Anesthesia Home Care Instructions  Activity: Get plenty of rest for the remainder of the day. A responsible individual must stay with you for 24 hours following the procedure.  For the next 24 hours, DO NOT: -Drive a car -Paediatric nurse -Drink alcoholic beverages -Take any medication unless instructed by your physician -Make any legal decisions or sign important papers.  Meals: Start with liquid foods such as gelatin or soup. Progress to regular foods as tolerated. Avoid greasy, spicy, heavy foods. If nausea and/or vomiting occur, drink only clear liquids until the nausea and/or vomiting subsides. Call your physician if vomiting continues.  Special Instructions/Symptoms: Your throat may feel dry or sore from the anesthesia or the breathing tube placed in your throat during surgery. If this causes discomfort, gargle with warm salt water. The discomfort should disappear within 24 hours.  If you had a scopolamine patch placed behind your ear for the management of post- operative nausea and/or vomiting:  1. The medication in the patch is effective for 72 hours, after which it should be removed.  Wrap patch in a tissue and discard in the trash. Wash hands thoroughly with soap and water. 2. You may remove the patch earlier than 72 hours if you experience unpleasant side effects which may include dry mouth, dizziness or visual disturbances. 3. Avoid touching the patch. Wash your hands with soap and water after contact with the patch.     Implanted [facility] Guide  An implanted port is a device that is placed under the skin. It is usually placed in the chest. The device can be used to give IV medicine, to take blood, or for dialysis. You may have an implanted port if:  You need IV medicine that would be irritating to the small veins in your hands or arms.  You need IV medicines, such as antibiotics, for a long period of time.  You need IV nutrition for a long period of time.  You  need dialysis. Having a port means that your health care provider will not need to use the veins in your arms for these procedures. You may have fewer limitations when using a port than you would if you used other types of long-term IVs, and you will likely be able to return to normal activities after your incision heals. An implanted port has two main parts:  Reservoir. The reservoir is the part where a needle is inserted to give medicines or draw blood. The reservoir is round. After it is placed, it appears as a small, raised area under your skin.  Catheter. The catheter is a thin, flexible tube that connects the reservoir to a vein. Medicine that is inserted into the reservoir goes into the catheter and then into the vein. How is my port accessed? To access your port:  A numbing cream may be placed on the skin over the port site.  Your health care provider will put on a mask and sterile gloves.  The skin over your port will be cleaned carefully with a germ-killing soap and allowed to dry.  Your health care provider will gently pinch the port and insert a needle into it.  Your health care provider will check for a blood return to make sure the port is in the vein and is not clogged.  If your port needs to remain accessed to get medicine continuously (constant infusion), your health care provider will place a clear bandage (dressing) over the needle site. The dressing and needle will need  to be changed every week, or as told by your health care provider. What is flushing? Flushing helps keep the port from getting clogged. Follow instructions from your health care provider about how and when to flush the port. Ports are usually flushed with saline solution or a medicine called heparin. The need for flushing will depend on how the port is used:  If the port is only used from time to time to give medicines or draw blood, the port may need to be flushed: ? Before and after medicines have been  given. ? Before and after blood has been drawn. ? As part of routine maintenance. Flushing may be recommended every 4-6 weeks.  If a constant infusion is running, the port may not need to be flushed.  Throw away any syringes in a disposal container that is meant for sharp items (sharps container). You can buy a sharps container from a pharmacy, or you can make one by using an empty hard plastic bottle with a cover. How long will my port stay implanted? The port can stay in for as long as your health care provider thinks it is needed. When it is time for the port to come out, a surgery will be done to remove it. The surgery will be similar to the procedure that was done to put the port in. Follow these instructions at home:   Flush your port as told by your health care provider.  If you need an infusion over several days, follow instructions from your health care provider about how to take care of your port site. Make sure you: ? Wash your hands with soap and water before you change your dressing. If soap and water are not available, use alcohol-based hand sanitizer. ? Change your dressing as told by your health care provider. ? Place any used dressings or infusion bags into a plastic bag. Throw that bag in the trash. ? Keep the dressing that covers the needle clean and dry. Do not get it wet. ? Do not use scissors or sharp objects near the tube. ? Keep the tube clamped, unless it is being used.  Check your port site every day for signs of infection. Check for: ? Redness, swelling, or pain. ? Fluid or blood. ? Pus or a bad smell.  Protect the skin around the port site. ? Avoid wearing bra straps that rub or irritate the site. ? Protect the skin around your port from seat belts. Place a soft pad over your chest if needed.  Bathe or shower as told by your health care provider. The site may get wet as long as you are not actively receiving an infusion.  Return to your normal activities  as told by your health care provider. Ask your health care provider what activities are safe for you.  Carry a medical alert card or wear a medical alert bracelet at all times. This will let health care providers know that you have an implanted port in case of an emergency. Get help right away if:  You have redness, swelling, or pain at the port site.  You have fluid or blood coming from your port site.  You have pus or a bad smell coming from the port site.  You have a fever. Summary  Implanted ports are usually placed in the chest for long-term IV access.  Follow instructions from your health care provider about flushing the port and changing bandages (dressings).  Take care of the area around your port  by avoiding clothing that puts pressure on the area, and by watching for signs of infection.  Protect the skin around your port from seat belts. Place a soft pad over your chest if needed.  Get help right away if you have a fever or you have redness, swelling, pain, drainage, or a bad smell at the port site. This information is not intended to replace advice given to you by your health care provider. Make sure you discuss any questions you have with your health care provider. Document Revised: 10/07/2018 Document Reviewed: 07/18/2016 Elsevier Patient Education  2020 [employer].

## 2020-05-28 NOTE — Anesthesia Postprocedure Evaluation (Signed)
Anesthesia Post Note  Patient: Susan Martin  Procedure(s) Performed: INSERTION PORT-A-CATH (Right Chest)     Patient location during evaluation: PACU Anesthesia Type: General Level of consciousness: awake and alert Pain management: pain level controlled Vital Signs Assessment: post-procedure vital signs reviewed and stable Respiratory status: spontaneous breathing, nonlabored ventilation, respiratory function stable and patient connected to nasal cannula oxygen Cardiovascular status: blood pressure returned to baseline and stable Postop Assessment: no apparent nausea or vomiting Anesthetic complications: no   No complications documented.  Last Vitals:  Vitals:   05/28/20 1445 05/28/20 1500  BP: (!) 144/74 (!) 146/75  Pulse: 84 84  Resp: 11 11  Temp:  36.7 C  SpO2: 95% 95%    Last Pain:  Vitals:   05/28/20 1500  TempSrc:   PainSc: 0-No pain                 Barnet Glasgow

## 2020-05-28 NOTE — H&P (Signed)
Susan Martin is an 59 y.o. female.   Chief Complaint: colon cancer HPI: 59 yo female s/p colon resection for perforated colon cancer. On further work up she has concern for metastasis in the liver. She presents for port-o-cath insertion.  Past Medical History:  Diagnosis Date  . 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 Delton Coombes   04-29-2020 [facility] surgery for perforated colon s/p sigmoid colectomy w/ colostomy; dx  Stage IV colon cancer mets to liver  . Anxiety   . Depression   . Hypertension    followed by dr Glo Herring and oncology  (05-27-2020 per pt does not have pcp yet)  . IDA (iron deficiency anemia)   . Pulmonary nodule, right     Past Surgical History:  Procedure Laterality Date  . ABDOMINAL HYSTERECTOMY  03-31-2016   @[facility]    W/  BILATERAL SALPINOOPHORECTOMY   . APPLICATION OF WOUND VAC N/A 04/29/2020   Procedure: APPLICATION OF WOUND VAC;  Surgeon: Phyliss Hulick, Arta Bruce, MD;  Location: [facility];  Service: General;  Laterality: N/A;  . ENDOMETRIAL ABLATION    . LAPAROTOMY N/A 04/29/2020   Procedure: EXPLORATORY LAPAROTOMY;  Surgeon: Kieth Brightly Arta Bruce, MD;  Location: [facility];  Service: General;  Laterality: N/A;  . PARTIAL COLECTOMY N/A 04/29/2020   Procedure: PARTIAL COLECTOMY WITH END COLOSTOMY;  Surgeon: Kieth Brightly Arta Bruce, MD;  Location: [facility];  Service: General;  Laterality: N/A;  . SALPINGOOPHORECTOMY Left 03/31/2016   Procedure: LEFT SALPINGO OOPHORECTOMY WITH FROZEN SECTION,  ABDOMINAL HYSTERECTOMY WITH BILATERAL SALPINGO-OOPHORECTOMY;  Surgeon: Jonnie Kind, MD;  Location: [facility];  Service: Gynecology;  Laterality: Left;    Family History  Problem Relation Age of Onset  . Hypertension Mother   . Diabetes Mother   . Cirrhosis Father    Social History:  reports that she quit smoking about 5 years ago. Her smoking use included cigarettes. She has a 2.50 pack-year smoking history. She has never used smokeless tobacco. She reports that  she does not drink alcohol and does not use drugs.  Allergies: No Known Allergies  Medications Prior to Admission  Medication Sig Dispense Refill  . acetaminophen (TYLENOL) 500 MG tablet Take 2 tablets (1,000 mg total) by mouth every 6 (six) hours as needed for mild pain, fever or headache.    . hydrOXYzine (VISTARIL) 25 MG capsule Take 1 capsule (25 mg total) by mouth 3 (three) times daily as needed. Prn anxiety 60 capsule 2  . methocarbamol (ROBAXIN) 500 MG tablet Take 2 tablets (1,000 mg total) by mouth every 8 (eight) hours as needed for muscle spasms. 60 tablet 1  . oxyCODONE (OXY IR/ROXICODONE) 5 MG immediate release tablet Take 1 tablet (5 mg total) by mouth every 6 (six) hours as needed for moderate pain. 120 tablet 0  . PARoxetine (PAXIL) 40 MG tablet Take 1 tablet (40 mg total) by mouth every morning. 90 tablet 3  . polyethylene glycol (MIRALAX / GLYCOLAX) packet Take 17 g by mouth every other day.    . triamterene-hydrochlorothiazide (DYAZIDE) 37.5-25 MG capsule Take 1 each (1 capsule total) by mouth daily. (Patient taking differently: Take 1 capsule by mouth daily. ) 30 capsule 11  . fluticasone (FLONASE) 50 MCG/ACT nasal spray Place 1-2 sprays into both nostrils daily as needed for allergies or rhinitis.      Results for orders placed or performed during the hospital encounter of 05/28/20 (from the past 48 hour(s))  I-STAT, chem 8     Status: Abnormal  Collection Time: 05/28/20 10:15 AM  Result Value Ref Range   Sodium 137 135 - 145 mmol/L   Potassium 3.7 3.5 - 5.1 mmol/L   Chloride 100 98 - 111 mmol/L   BUN 15 6 - 20 mg/dL   Creatinine, Ser 0.60 0.44 - 1.00 mg/dL   Glucose, Bld 106 (H) 70 - 99 mg/dL    Comment: Glucose reference range applies only to samples taken after fasting for at least 8 hours.   Calcium, Ion 1.22 1.15 - 1.40 mmol/L   TCO2 26 22 - 32 mmol/L   Hemoglobin 12.9 12.0 - 15.0 g/dL   HCT 38.0 36 - 46 %   No results found.  Review of Systems   Constitutional: Negative for chills and fever.  HENT: Negative for hearing loss.   Respiratory: Negative for cough.   Cardiovascular: Negative for chest pain and palpitations.  Gastrointestinal: Negative for abdominal pain, nausea and vomiting.  Genitourinary: Negative for dysuria and urgency.  Musculoskeletal: Negative for myalgias and neck pain.  Skin: Negative for rash.  Neurological: Negative for dizziness and headaches.  Hematological: Does not bruise/bleed easily.  Psychiatric/Behavioral: Negative for suicidal ideas.    Blood pressure (!) 156/81, pulse 73, temperature (!) 96.9 F (36.1 C), temperature source Oral, resp. rate 14, height 5\' 3"  (1.6 m), weight 58.2 kg, SpO2 99 %. Physical Exam Vitals reviewed.  Constitutional:      Appearance: She is well-developed.  HENT:     Head: Normocephalic and atraumatic.  Eyes:     Conjunctiva/sclera: Conjunctivae normal.     Pupils: Pupils are equal, round, and reactive to light.  Cardiovascular:     Rate and Rhythm: Normal rate and regular rhythm.  Pulmonary:     Effort: Pulmonary effort is normal.     Breath sounds: Normal breath sounds.  Abdominal:     General: Bowel sounds are normal. There is no distension.     Palpations: Abdomen is soft.     Tenderness: There is no abdominal tenderness.     Comments: Left sided ostomy  Musculoskeletal:        General: Normal range of motion.     Cervical back: Normal range of motion and neck supple.  Skin:    General: Skin is warm and dry.  Neurological:     Mental Status: She is alert and oriented to person, place, and time.  Psychiatric:        Behavior: Behavior normal.      Assessment/Plan 59 yo female with colon cancer -port o cath insertion with SU and FL guidance -[facility] protocol -planned outpatient procedure  Mickeal Skinner, MD 05/28/2020, 10:20 AM

## 2020-05-28 NOTE — Transfer of Care (Signed)
Immediate Anesthesia Transfer of Care Note  Patient: Susan Martin  Procedure(s) Performed: Procedure(s) (LRB): INSERTION PORT-A-CATH (Right)  Patient Location: PACU  Anesthesia Type: General  Level of Consciousness: awake, sedated, patient cooperative and responds to stimulation  Airway & Oxygen Therapy: Patient Spontanous Breathing and Patient connected to [place] 02 and soft FM   Post-op Assessment: Report given to PACU RN, Post -op Vital signs reviewed and stable and Patient moving all extremities  Post vital signs: Reviewed and stable  Complications: No apparent anesthesia complications

## 2020-05-28 NOTE — Op Note (Signed)
Preoperative diagnosis: colon cancer  Postoperative diagnosis: same  Procedure: insertion of R IJ port-a-cath with ultrasound and fluoro guidance  Surgeon: Gurney Maxin, M.D.  Asst: none  Anesthesia: gen   Indications for procedure: 59 yo female with recent diagnosis of metastatic colon cancer.  Description of procedure: The patient was brought into the operative suite, anesthesia was administered with LMA, both arms were tucked with offloading foam over all pressure points. The patient was prepped and draped in the usual sterile fashion. Next WHO checklist was completed.   The patient was put in trendelenberg, the [place] was used to assess anatomy and the RIJ was large and lay anterior the the common carotid artery A 18ga needle was used to gain access to the RIJ using ultrasound guidance. Nonpulsatile flow was seen and the J-wire was advanced without tension. XR was used to confirm placement within the venous system. Arrthymias were seen, the wire was clamped in place. The percutaneous access site was widened with a 11 blade. Local anesthesia was used to anesthetize the space inferior to the right clavicle. Next a 3cm incision was made 2cm inferior to the clavicle. Cautery was used to create a pocket along the fascia inferior to incision. The tunneling device was used to make a wide turn from the pocket up to the perc access site. The introducer and sheath were then thread over the J wire in seldinger technique and wire was removed. Next the catheter was thread from the incision to the access site and inserted into the sheath after the introducer was removed. Sheath was then pulled and removed. XR showed the tip of catheter to be at the atrial-caval junction. The port was then attached to the catheter cutting the catheter at appropriate length. The port was then placed in pocket and sewed to the fascia in two places with a 2-0 prolene. The port was accessed with huber needel and flushed and drew  easily and the port was flushed with concentrated heparin. The incision was closed with 3-0 vicryl followed with 4-0 monocryl in running subcu fashion. A single 4-0 was used to close the access site. Dermabond was placed for dressing. Patient was extubated and brought to pacu in stable condition.  Findings: patent RIJ, catheter tip at the SVC atrial junction  Specimen: none  Blood loss: 10cc  Local anesthesia: 20cc 0.25% marcaine w epi  Complications: none  Gurney Maxin, M.D. General, Bariatric, & Minimally Invasive Surgery [facility]

## 2020-05-28 NOTE — Anesthesia Procedure Notes (Signed)
Procedure Name: LMA Insertion Date/Time: 05/28/2020 11:57 AM Performed by: Justice Rocher, CRNA Pre-anesthesia Checklist: Patient identified, Emergency Drugs available, Suction available, Patient being monitored and Timeout performed Patient Re-evaluated:Patient Re-evaluated prior to induction Oxygen Delivery Method: Circle system utilized Preoxygenation: Pre-oxygenation with 100% oxygen Induction Type: IV induction Ventilation: Mask ventilation without difficulty LMA: LMA inserted LMA Size: 3.0 Number of attempts: 1 Airway Equipment and Method: Bite block Placement Confirmation: positive ETCO2,  breath sounds checked- equal and bilateral and CO2 detector Tube secured with: Tape Dental Injury: Teeth and Oropharynx as per pre-operative assessment

## 2020-05-29 ENCOUNTER — Telehealth: Payer: Self-pay | Admitting: Licensed Clinical Social Worker

## 2020-05-29 MED ORDER — PROCHLORPERAZINE MALEATE 10 MG PO TABS: 10.0000 mg | ORAL_TABLET | Freq: Four times a day (QID) | ORAL | 1 refills | Status: DC | PRN

## 2020-05-29 MED ORDER — LIDOCAINE-PRILOCAINE 2.5-2.5 % EX CREA: TOPICAL_CREAM | CUTANEOUS | 3 refills | Status: DC

## 2020-05-29 NOTE — Telephone Encounter (Signed)
[facility] Intern called and spoke with pt about enrolling in SW services in response to referral from [facility], however pt already had an upcoming appt with a SW at the cancer center and denied services here at this time.

## 2020-05-29 NOTE — Patient Instructions (Addendum)
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  We will treat you in the clinic every 2 weeks with a combination of chemotherapy and immunotherapy drugs.  Those drugs are:  bevacizumab (Avastin - immunotherapy); oxaliplatin; leucovorin (not a chemo drug); and fluorouracil (5FU).  The intent of treatment is to control this cancer, prevent it from spreading further, and to alleviate any symptoms you may be having related to your disease.  You will see the doctor regularly throughout treatment.  We will obtain blood work from you prior to every treatment and monitor your results to make sure it is safe to give your treatment. The doctor monitors your response to treatment by the way you are feeling, your blood work, and by obtaining scans periodically. There will be wait times while you are here for treatment.  It will take about 30 minutes to 1 hour for your lab work to result.  Then there will be wait times while pharmacy mixes your medications.    Oxaliplatin (Eloxatin)  About This Drug  Oxaliplatin is used to treat cancer. It is given in the vein (IV).  It takes two hours to infuse.  Possible Side Effects  . Bone marrow suppression. This is a decrease in the number of white blood cells, red blood cells, and platelets. This may raise your risk of infection, make you tired and weak (fatigue), and raise your risk of bleeding.  . Tiredness  . Soreness of the mouth and throat. You may have red areas, white patches, or sores that hurt.  . Nausea and vomiting (throwing up)  . Diarrhea (loose bowel movements)  . Changes in your liver function  . Effects on the nerves called peripheral neuropathy. You may feel numbness, tingling, or pain in your hands and feet, and may be worse in cold temperatures. It may be hard for you to button your clothes, open jars, or walk as usual. The effect on the nerves may  get worse with more doses of the drug. These effects get better in some people after the drug is stopped but it does not get better in all people  Note: Each of the side effects above was reported in 40% or greater of patients treated with oxaliplatin. Not all possible side effects are included above.   Warnings and Precautions  . Allergic reactions, including anaphylaxis, which may be life-threatening are rare but may happen in some patients. Signs of allergic reaction to this drug may be swelling of the face, feeling like your tongue or throat are swelling, trouble breathing, rash, itching, fever, chills, feeling dizzy, and/or feeling that your heart is beating in a fast or not normal way. If this happens, do not take another dose of this drug. You should get urgent medical treatment.  . Inflammation (swelling) of the lungs, which may be life-threatening. You may have a dry cough or trouble breathing.  . Effects on the nerves (neuropathy) may resolve within 14 days, or it may persist beyond 14 days.  . Severe decrease in white blood cells when combined with the chemotherapy agents 5-fluorouracil and leucovorin. This may be life-threatening.  . Severe changes in your liver function  . Abnormal heart beat and/or EKG, which can be life-threatening  . Rhabdomyolysis- damage to your muscles which may release proteins in your blood and affect how your kidneys work, which can be life-threatening. You may have severe muscle weakness and/or pain, or dark  urine.  Important Information  . This drug may impair your ability to drive or use machinery. Talk to your doctor and/or nurse about precautions you may need to take.  . This drug may be present in the saliva, tears, sweat, urine, stool, vomit, semen, and vaginal secretions. Talk to your doctor and/or your nurse about the necessary precautions to take during this time.  * The effects on the nerves can be aggravated by exposure to cold. Avoid  cold beverages, use of ice and make sure you cover your skin and dress warmly prior to being exposed to cold       temperatures while you are receiving treatment with oxaliplatin*   Treating Side Effects  . Manage tiredness by pacing your activities for the day.  . Be sure to include periods of rest between energy-draining activities.  . To decrease the risk of infection, wash your hands regularly.  . Avoid close contact with people who have a cold, the flu, or other infections. . Take your temperature as your doctor or nurse tells you, and whenever you feel like you may have a fever.  . To help decrease the risk of bleeding, use a soft toothbrush. Check with your nurse before using dental floss.  . Be very careful when using knives or tools.  . Use an electric shaver instead of a razor.  . Drink plenty of fluids (a minimum of eight glasses per day is recommended).  . Mouth care is very important. Your mouth care should consist of routine, gentle cleaning of your teeth or dentures and rinsing your mouth with a mixture of 1/2 teaspoon of salt in 8 ounces of water or 1/2 teaspoon of baking soda in 8 ounces of water. This should be done at least after each meal and at bedtime.  . If you have mouth sores, avoid mouthwash that has alcohol. Also avoid alcohol and smoking because they can bother your mouth and throat.  . To help with nausea and vomiting, eat small, frequent meals instead of three large meals a day. Choose foods and drinks that are at room temperature. Ask your nurse or doctor about other helpful tips and medicine that is available to help stop or lessen these symptoms.  . If you throw up or have loose bowel movements, you should drink more fluids so that you do not become dehydrated (lack of water in the body from losing too much fluid).  . If you have diarrhea, eat low-fiber foods that are high in protein and calories and avoid foods that can irritate your digestive tracts or  lead to cramping.  . Ask your nurse or doctor about medicine that can lessen or stop your diarrhea.  . If you have numbness and tingling in your hands and feet, be careful when cooking, walking, and handling sharp objects and hot liquids.  . Do not drink cold drinks or use ice in beverages. Drink fluids at room temperature or warmer, and drink through a straw.  . Wear gloves to touch cold objects, and wear warm clothing and cover you skin during cold weather.   Food and Drug Interactions  . There are no known interactions of oxaliplatin with food and other medications.  . This drug may interact with other medicines. Tell your doctor and pharmacist about all the prescription and over-the-counter medicines and dietary supplements (vitamins, minerals, herbs and others) that you are taking at this time. Also, check with your doctor or pharmacist before starting any new prescription  or over-the-counter medicines, or dietary supplements to make sure that there are no interactions   When to Call the Doctor  Call your doctor or nurse if you have any of these symptoms and/or any new or unusual symptoms:  . Fever of 100.4 F (38 C) or higher  . Chills  . Tiredness that interferes with your daily activities  . Feeling dizzy or lightheaded  . Easy bleeding or bruising  . Feeling that your heart is beating in a fast or not normal way (palpitations)  . Pain in your chest  . Dry cough  . Trouble breathing  . Pain in your mouth or throat that makes it hard to eat or drink  . Nausea that stops you from eating or drinking and/or is not relieved by prescribed medicines  . Throwing up  . Diarrhea, 4 times in one day or diarrhea with lack of strength or a feeling of being dizzy  . Numbness, tingling, or pain in your hands and feet  . Signs of possible liver problems: dark urine, pale bowel movements, bad stomach pain, feeling very tired and weak, unusual itching, or yellowing of the  eyes or skin  . Signs of rhabdomyolysis: decreased urine, very dark urine, muscle pain in the shoulders, thighs, or lower back; muscle weakness or trouble moving arms and legs  . Signs of allergic reaction: swelling of the face, feeling like your tongue or throat are swelling, trouble breathing, rash, itching, fever, chills, feeling dizzy, and/or feeling that your heart is beating in a fast or not normal way. If this happens, call 911 for emergency care.  . If you think you may be pregnant  Reproduction Warnings  . Pregnancy warning: This drug may have harmful effects on the unborn baby. Women of childbearing potential should use effective methods of birth control during your cancer treatment. Let your doctor know right away if you think you may be pregnant or may have impregnated your partner.  . Breastfeeding warning: It is not known if this drug passes into breast milk. For this reason, women should talk to their doctor about the risks and benefits of breastfeeding during treatment with this drug because this drug may enter the breast milk and cause harm to a breastfeeding baby.  . Fertility warning: Human fertility studies have not been done with this drug. Talk with your doctor or nurse if you plan to have children. Ask for information on sperm or egg banking.   Leucovorin Calcium  About This Drug  Leucovorin is a vitamin. It is used in combination with other cancer fighting drugs such as 5-fluorouracil and methotrexate. Leucovorin is given in the vein (IV).  This drug runs at the same time as the oxaliplatin and takes 2 hours to infuse.   Possible Side Effects . Rash and itching  Note: Leucovorin by itself has very few side effects. Other side effects you may have can be caused by the other drugs you are taking, such as 5-fluorouracil.   Warnings and Precautions  . Allergic reactions, including anaphylaxis are rare but may happen in some patients. Signs of allergic reaction to  this drug may be swelling of the face, feeling like your tongue or throat are swelling, trouble breathing, rash, itching, fever, chills, feeling dizzy, and/or feeling that your heart is beating in a fast or not normal way. If this happens, do not take another dose of this drug. You should get urgent medical treatment.  Food and Drug Interactions  . There  are no known interactions of leucovorin with food.  . This drug may interact with other medicines. Tell your doctor and pharmacist about all the prescription and over-the-counter medicines and dietary supplements (vitamins, minerals, herbs and others) that you are taking at this time.  . Also, check with your doctor or pharmacist before starting any new prescription or over-the-counter medicines, or dietary supplements to make sure that there are no interactions.   When to Call the Doctor  Call your doctor or nurse if you have any of these symptoms and/or any new or unusual symptoms:  . A new rash or a rash that is not relieved by prescribed medicines  . Signs of allergic reaction: swelling of the face, feeling like your tongue or throat are swelling, trouble breathing, rash, itching, fever, chills, feeling dizzy, and/or feeling that your heart is beating in a fast or not normal way. If this happens, call 911 for emergency care.  . If you think you may be pregnant   Reproduction Warnings  . Pregnancy warning: It is not known if this drug may harm an unborn child. For this reason, be sure to talk with your doctor if you are pregnant or planning to become pregnant while receiving this drug. Let your doctor know right away if you think you may be pregnant  . Breastfeeding warning: It is not known if this drug passes into breast milk. For this reason, women should talk to their doctor about the risks and benefits of breastfeeding during treatment with this drug because this drug may enter the breast milk and cause harm to a breastfeeding  baby.  . Fertility warning: Human fertility studies have not been done with this drug. Talk with your doctor or nurse if you plan to have children. Ask for information on sperm or egg banking.   5-Fluorouracil (Adrucil; 5FU)  About This Drug  Fluorouracil is used to treat cancer. It is given in the vein (IV). It is given as an IV push from a syringe and also as a continuous infusion given via an ambulatory pump (a pump you take home and wear for a specified amount of time).  Possible Side Effects  . Bone marrow suppression. This is a decrease in the number of white blood cells, red blood cells, and platelets. This may raise your risk of infection, make you tired and weak (fatigue), and raise your risk of bleeding  . Changes in the tissue of the heart and/or heart attack. Some changes may happen that can cause your heart to have less ability to pump blood.  . Blurred vision or other changes in eyesight  . Nausea and throwing up (vomiting)  . Diarrhea (loose bowel movements)  . Ulcers - sores that may cause pain or bleeding in your digestive tract, which includes your mouth, esophagus, stomach, small/large intestines and rectum  . Soreness of the mouth and throat. You may have red areas, white patches, or sores that hurt.  . Allergic reactions, including anaphylaxis are rare but may happen in some patients. Signs of allergic reaction to this drug may be swelling of the face, feeling like your tongue or throat are swelling, trouble breathing, rash, itching, fever, chills, feeling dizzy, and/or feeling that your heart is beating in a fast or not normal way. If this happens, do not take another dose of this drug. You should get urgent medical treatment.  . Sensitivity to light (photosensitivity). Photosensitivity means that you may become more sensitive to the sun and/or light.  You may get a skin rash/reaction if you are in the sun or are exposed to sun lamps and tanning beds. Your eyes may  water more, mostly in bright light.  . Changes in your nail color, nail loss and/or brittle nail  . Darkening of the skin, or changes to the color of your skin and/or veins used for infusion  . Rash, dry skin, or itching  Note: Not all possible side effects are included above.  Warnings and Precautions  . Hand-and-foot syndrome. The palms of your hands or soles of your feet may tingle, become numb, painful, swollen, or red.  . Changes in your central nervous system can happen. The central nervous system is made up of your brain and spinal cord. You could feel extreme tiredness, agitation, confusion, hallucinations (see or hear things that are not there), trouble understanding or speaking, loss of control of your bowels or bladder, eyesight changes, numbness or lack of strength to your arms, legs, face, or body, or coma. If you start to have any of these symptoms let your doctor know right away.  . Side effects of this drug may be unexpectedly severe in some patients  Note: Some of the side effects above are very rare. If you have concerns and/or questions, please discuss them with your medical team.   Important Information  . This drug may be present in the saliva, tears, sweat, urine, stool, vomit, semen, and vaginal secretions. Talk to your doctor and/or your nurse about the necessary precautions to take during this time.   Treating Side Effects  . Manage tiredness by pacing your activities for the day.  . Be sure to include periods of rest between energy-draining activities.  . To help decrease the risk of infections, wash your hands regularly.  . Avoid close contact with people who have a cold, the flu, or other infections.  . Take your temperature as your doctor or nurse tells you, and whenever you feel like you may have a fever.  . Use a soft toothbrush. Check with your nurse before using dental floss.  . Be very careful when using knives or tools.  . Use an electric  shaver instead of a razor.  . If you have a nose bleed, sit with your head tipped slightly forward. Apply pressure by lightly pinching the bridge of your nose between your thumb and forefinger. Call your doctor if you feel dizzy or faint or if the bleeding doesn't stop after 10 to 15 minutes.  . Drink plenty of fluids (a minimum of eight glasses per day is recommended).  . If you throw up or have loose bowel movements, you should drink more fluids so that you do not  become dehydrated (lack of water in the body from losing too much fluid).  . To help with nausea and vomiting, eat small, frequent meals instead of three large meals a day. Choose foods and drinks that are at room temperature. Ask your nurse or doctor about other helpful tips and medicine that is available to help, stop, or lessen these symptoms.  . If you have diarrhea, eat low-fiber foods that are high in protein and calories and avoid foods that can irritate your digestive tracts or lead to cramping.  . Ask your nurse or doctor about medicine that can lessen or stop your diarrhea.  . Mouth care is very important. Your mouth care should consist of routine, gentle cleaning of your teeth or dentures and rinsing your mouth with a mixture  of 1/2 teaspoon of salt in 8 ounces of water or 1/2 teaspoon of baking soda in 8 ounces of water. This should be done at least after each meal and at bedtime.  . If you have mouth sores, avoid mouthwash that has alcohol. Also avoid alcohol and smoking because they can bother your mouth and throat.  Marland Kitchen Keeping your nails moisturized may help with brittleness.  . To help with itching, moisturize your skin several times day.  . Use sunscreen with SPF 30 or higher when you are outdoors even for a short time. Cover up when you are out in the sun. Wear wide-brimmed hats, long-sleeved shirts, and pants. Keep your neck, chest, and back covered. Wear dark sun glasses when in the sun or bright lights.  .  If you get a rash do not put anything on it unless your doctor or nurse says you may. Keep the area around the rash clean and dry. Ask your doctor for medicine if your rash bothers you.  Marland Kitchen Keeping your pain under control is important to your well-being. Please tell your doctor or nurse if you are experiencing pain.   Food and Drug Interactions  . There are no known interactions of fluorouracil with food.  . Check with your doctor or pharmacist about all other prescription medicines and over-the-counter medicines and dietary supplements (vitamins, minerals, herbs and others) you are taking before starting this medicine as there are known drug interactions with 5-fluoroucacil. Also, check with your doctor or pharmacist before starting any new prescription or over-the-counter medicines, or dietary supplements to make sure that there are no interactions.  When to Call the Doctor  Call your doctor or nurse if you have any of these symptoms and/or any new or unusual symptoms:  . Fever of 100.4 F (38 C) or higher  . Chills  . Easy bleeding or bruising  . Nose bleed that doesn't stop bleeding after 10-15 minutes  . Trouble breathing  . Feeling dizzy or lightheaded  . Feeling that your heart is beating in a fast or not normal way (palpitations)  . Chest pain or symptoms of a heart attack. Most heart attacks involve pain in the center of the chest that lasts more than a few minutes. The pain may go away and come back or it can be constant. It can feel like pressure, squeezing, fullness, or pain. Sometimes pain is felt in one or both arms, the back, neck, jaw, or stomach. If any of these symptoms last 2 minutes, call 911.  Marland Kitchen Confusion and/or agitation  . Hallucinations  . Trouble understanding or speaking  . Loss of control of bowels or bladder  . Blurry vision or changes in your eyesight  . Headache that does not go away  . Numbness or lack of strength to your arms, legs, face, or  body  . Nausea that stops you from eating or drinking and/or is not relieved by prescribed medicines  . Throwing up more than 3 times a day  . Diarrhea, 4 times in one day or diarrhea with lack of strength or a feeling of being dizzy  . Pain in your mouth or throat that makes it hard to eat or drink  . Pain along the digestive tract - especially if worse after eating  . Blood in your vomit (bright red or coffee-ground) and/or stools (bright red, or black/tarry)  . Coughing up blood  . Tiredness that interferes with your daily activities  . Painful, red, or  swollen areas on your hands or feet or around your nails  . A new rash or a rash that is not relieved by prescribed medicines  . Develop sensitivity to sunlight/light  . Numbness and/or tingling of your hands and/or feet  . Signs of allergic reaction: swelling of the face, feeling like your tongue or throat are swelling, trouble breathing, rash, itching, fever, chills, feeling dizzy, and/or feeling that your heart is beating in a fast or not normal way. If this happens, call 911 for emergency care.  . If you think you are pregnant or may have impregnated your partner  Reproduction Warnings  . Pregnancy warning: This drug may have harmful effects on the unborn baby. Women of child bearing potential should use effective methods of birth control during your cancer treatment and 3 months after treatment. Men with female partners of childbearing potential should use effective methods of birth control during your cancer treatment and for 3 months after your cancer treatment. Let your doctor know right away if you think you may be pregnant or may have impregnated your partner.  . Breastfeeding warning: It is not known if this drug passes into breast milk. For this reason, Women should not breastfeed during treatment because this drug could enter the breast milk and cause harm to a breastfeeding baby.  . Fertility warning: In men and  women both, this drug may affect your ability to have children in the future. Talk with your doctor or nurse if you plan to have children. Ask for information on sperm or egg banking.   Bevacizumab (Avastin)  About This Drug Bevacizumab is used to treat cancer. It is given in the vein (IV).  Possible Side Effects . Teary eyes  . Runny/stuffy nose  . Nosebleed  . Changes in the way food and drinks taste  . Headache  . Back pain  . Protein in your urine  . Bleeding in your rectum  . Dry skin  . A red skin rash which can be peeling or scaling  . High blood pressure  Note: Each of the side effects above was reported in 10% or greater of patients treated with bevacizumab-xxxx. Not all possible side effects are included above.  Warnings and Precautions  . Perforation or fistula- an abnormal hole in your stomach, intestine, esophagus, or other organ, which can be life-threatening . Slow wound healing, which can be life-threatening . Abnormal bleeding which can be life-threatening - symptoms may be coughing up blood, throwing up blood (may look like coffee grounds), red or black tarry bowel movements, abnormally heavy menstrual flow, nosebleeds or any other unusual bleeding. . Blood clots and events such as stroke and heart attack. A blood clot in your leg may cause your leg to swell, appear red and warm, and/or cause pain. A blood clot in your lungs may cause trouble breathing, pain when breathing, and/or chest pain. . Severe high blood pressure . Changes in your central nervous system can happen. The central nervous system is made up of your brain and spinal cord. You could feel extreme tiredness, agitation, confusion, hallucinations  . This drug may interact with other medicines. Tell your doctor and pharmacist about all the medicines and dietary supplements (vitamins, minerals, herbs and others) that you are taking at this time. Also, check with your doctor or pharmacist before  starting any new prescription or over-the-counter medicines, or dietary supplements to make sure that there are no interactions.  When to Call the Doctor Call your doctor  or nurse if you have any of these symptoms and/or any new or unusual symptoms:  . Fever of 100.4 F (38 C) or higher  . Chills  . Confusion and/or agitation  . Hallucinations  . Trouble understanding or speaking  . Headache that does not go away  . Nose bleed that doesn't stop bleeding after 10 -15 minutes  . Feeling dizzy or lightheaded  . Blurry vision or changes in your eyesight  . Difficulty swallowing  . Easy bleeding or bruising  . Blood in your urine, vomit (bright red or coffee-ground) and/or stools ( bright red, or black/tarry)  . Coughing up blood  . Wheezing and/or trouble breathing  . Chest pain or symptoms of a heart attack. Most heart attacks involve pain in the center of the chest that lasts more than a few minutes. The pain may go away and come back. It can feel like pressure, squeezing, fullness, or pain. Sometimes pain is felt in one or both arms, the back, neck, jaw, or stomach. If any of these symptoms last 2 minutes, call 911.  Marland Kitchen Symptoms of a stroke such as sudden numbness or weakness of your face, arm, or leg, mostly on one side of your body; sudden confusion, trouble speaking or understanding; sudden trouble seeing in one or both eyes; sudden trouble walking, feeling dizzy, loss of balance or coordination; or sudden, bad headache with no known cause. If you have any of these symptoms for 2 minutes, call 911.  . Numbness or lack of strength to your arms, legs, face, or body  . Nausea that stops you from eating or drinking and/or relieved by prescribed medicine  . Throwing up  . Pain in your abdomen that does not go away  . Foamy or bubbly-looking urine  . Signs of infusion reaction: fever or shaking chills, flushing, facial swelling, feeling dizzy, headache, trouble breathing,  rash, itching, chest tightness, or chest pain.  . Pain that does not go away or is not relieved by prescribed medicine  . Your leg or arm is swollen, red, warm and/or painful  . Swelling of arms, hands, legs and/or feet  . Weight gain of 5 pounds in one week (fluid retention)  . If you think you may be pregnant  Reproduction Warnings . Pregnancy warning: This drug can have harmful effects on the unborn baby. Women of child bearing potential should use effective methods of birth control during your cancer treatment and for 6 months after treatment. In women, changes in your ovaries may happen that may cause menstrual bleeding to become irregular or stop, do not assume you cannot get pregnant. Let your doctor know right away if you think you may be pregnant  . Breastfeeding warning: Women should not breastfeed during treatment and for 6 months after treatment because this drug could enter the breast milk and cause harm to a breastfeeding baby.   . Fertility warning: In women, this drug may affect your ability to have children in the future. Talk with your doctor or nurse if you plan to have children. Ask for information on egg banking.     SELF CARE ACTIVITIES WHILE RECEIVING CHEMOTHERAPY:  Hydration Increase your fluid intake 48 hours prior to treatment and drink at least 8 to 12 cups (64 ounces) of water/decaffeinated beverages per day after treatment. You can still have your cup of coffee or soda but these beverages do not count as part of your 8 to 12 cups that you need to drink daily.  No alcohol intake.  Medications Continue taking your normal prescription medication as prescribed.  If you start any new herbal or new supplements please let us know first to make sure it is safe.  Mouth Care Have teeth cleaned professionally before starting treatment. Keep dentures and partial plates clean. Use soft toothbrush and do not use mouthwashes that contain alcohol. Biotene is a good  mouthwash that is available at most pharmacies or may be ordered by calling [phone]. Use warm salt water gargles (1 teaspoon salt per 1 quart warm water) before and after meals and at bedtime. If you need dental work, please let the doctor know before you go for your appointment so that we can coordinate the best possible time for you in regards to your chemo regimen. You need to also let your dentist know that you are actively taking chemo. We may need to do labs prior to your dental appointment.  Skin Care Always use sunscreen that has not expired and with SPF (Sun Protection Factor) of 50 or higher. Wear hats to protect your head from the sun. Remember to use sunscreen on your hands, ears, face, & feet.  Use good moisturizing lotions such as udder cream, eucerin, or even Vaseline. Some chemotherapies can cause dry skin, color changes in your skin and nails.    . Avoid long, hot showers or baths. . Use gentle, fragrance-free soaps and laundry detergent. . Use moisturizers, preferably creams or ointments rather than lotions because the thicker consistency is better at preventing skin dehydration. Apply the cream or ointment within 15 minutes of showering. Reapply moisturizer at night, and moisturize your hands every time after you wash them.  Hair Loss (if your doctor says your hair will fall out)  . If your doctor says that your hair is likely to fall out, decide before you begin chemo whether you want to wear a wig. You may want to shop before treatment to match your hair color. . Hats, turbans, and scarves can also camouflage hair loss, although some people prefer to leave their heads uncovered. If you go bare-headed outdoors, be sure to use sunscreen on your scalp. . Cut your hair short. It eases the inconvenience of shedding lots of hair, but it also can reduce the emotional impact of watching your hair fall out. . Don't perm or color your hair during chemotherapy. Those chemical  treatments are already damaging to hair and can enhance hair loss. Once your chemo treatments are done and your hair has grown back, it's OK to resume dyeing or perming hair.  With chemotherapy, hair loss is almost always temporary. But when it grows back, it may be a different color or texture. In older adults who still had hair color before chemotherapy, the new growth may be completely gray.  Often, new hair is very fine and soft.  Infection Prevention Please wash your hands for at least 30 seconds using warm soapy water. Handwashing is the #1 way to prevent the spread of germs. Stay away from sick people or people who are getting over a cold. If you develop respiratory systems such as green/yellow mucus production or productive cough or persistent cough let us know and we will see if you need an antibiotic. It is a good idea to keep a pair of gloves on when going into grocery stores/[employer] to decrease your risk of coming into contact with germs on the carts, etc. Carry alcohol hand gel with you at all times and use it frequently  if out in public. If your temperature reaches 100.4 or higher please call the clinic and let us know.  If it is after hours or on the weekend please go to the ER if your temperature is over 100.5.  Please have your own personal thermometer at home to use.    Sex and bodily fluids If you are going to have sex, a condom must be used to protect the person that isn't taking chemotherapy. Chemo can decrease your libido (sex drive). For a few days after chemotherapy, chemotherapy can be excreted through your bodily fluids.  When using the toilet please close the lid and flush the toilet twice.  Do this for a few day after you have had chemotherapy.   Effects of chemotherapy on your sex life Some changes are simple and won't last long. They won't affect your sex life permanently.  Sometimes you may feel: . too tired . not strong enough to be very active . sick or sore  . not  in the mood . anxious or low Your anxiety might not seem related to sex. For example, you may be worried about the cancer and how your treatment is going. Or you may be worried about money, or about how you family are coping with your illness.  These things can cause stress, which can affect your interest in sex. It's important to talk to your partner about how you feel.  Remember - the changes to your sex life don't usually last long. There's usually no medical reason to stop having sex during chemo. The drugs won't have any long term physical effects on your performance or enjoyment of sex. Cancer can't be passed on to your partner during sex  Contraception It's important to use reliable contraception during treatment. Avoid getting pregnant while you or your partner are having chemotherapy. This is because the drugs may harm the baby. Sometimes chemotherapy drugs can leave a man or woman infertile.  This means you would not be able to have children in the future. You might want to talk to someone about permanent infertility. It can be very difficult to learn that you may no longer be able to have children. Some people find counselling helpful. There might be ways to preserve your fertility, although this is easier for men than for women. You may want to speak to a fertility expert. You can talk about sperm banking or harvesting your eggs. You can also ask about other fertility options, such as donor eggs. If you have or have had breast cancer, your doctor might advise you not to take the contraceptive pill. This is because the hormones in it might affect the cancer. It is not known for sure whether or not chemotherapy drugs can be passed on through semen or secretions from the vagina. Because of this some doctors advise people to use a barrier method if you have sex during treatment. This applies to vaginal, anal or oral sex. Generally, doctors advise a barrier method only for the time you are actually  having the treatment and for about a week after your treatment. Advice like this can be worrying, but this does not mean that you have to avoid being intimate with your partner. You can still have close contact with your partner and continue to enjoy sex.  Animals If you have cats or birds we just ask that you not change the litter or change the cage.  Please have someone else do this for you while you are on  chemotherapy.   Food Safety During and After Cancer Treatment Food safety is important for people both during and after cancer treatment. Cancer and cancer treatments, such as chemotherapy, radiation therapy, and stem cell/bone marrow transplantation, often weaken the immune system. This makes it harder for your body to protect itself from foodborne illness, also called food poisoning. Foodborne illness is caused by eating food that contains harmful bacteria, parasites, or viruses.  Foods to avoid Some foods have a higher risk of becoming tainted with bacteria. These include: Marland Kitchen Unwashed fresh fruit and vegetables, especially leafy vegetables that can hide dirt and other contaminants . Raw sprouts, such as alfalfa sprouts . Raw or undercooked beef, especially ground beef, or other raw or undercooked meat and poultry . Fatty, fried, or spicy foods immediately before or after treatment.  These can sit heavy on your stomach and make you feel nauseous. . Raw or undercooked shellfish, such as oysters. . Sushi and sashimi, which often contain raw fish.  . Unpasteurized beverages, such as unpasteurized fruit juices, raw milk, raw yogurt, or cider . Undercooked eggs, such as soft boiled, over easy, and poached; raw, unpasteurized eggs; or foods made with raw egg, such as homemade raw cookie dough and homemade mayonnaise  Simple steps for food safety  Shop smart. . Do not buy food stored or displayed in an unclean area. . Do not buy bruised or damaged fruits or vegetables. . Do not buy cans that  have cracks, dents, or bulges. . Pick up foods that can spoil at the end of your shopping trip and store them in a cooler on the way home.  Prepare and clean up foods carefully. . Rinse all fresh fruits and vegetables under running water, and dry them with a clean towel or paper towel. . Clean the top of cans before opening them. . After preparing food, wash your hands for 20 seconds with hot water and soap. Pay special attention to areas between fingers and under nails. . Clean your utensils and dishes with hot water and soap. Marland Kitchen Disinfect your kitchen and cutting boards using 1 teaspoon of liquid, unscented bleach mixed into 1 quart of water.    Dispose of old food. . Eat canned and packaged food before its expiration date (the "use by" or "best before" date). . Consume refrigerated leftovers within 3 to 4 days. After that time, throw out the food. Even if the food does not smell or look spoiled, it still may be unsafe. Some bacteria, such as Listeria, can grow even on foods stored in the refrigerator if they are kept for too long.  Take precautions when eating out. . At restaurants, avoid buffets and salad bars where food sits out for a long time and comes in contact with many people. Food can become contaminated when someone with a virus, often a norovirus, or another "bug" handles it. . Put any leftover food in a "to-go" container yourself, rather than having the server do it. And, refrigerate leftovers as soon as you get home. . Choose restaurants that are clean and that are willing to prepare your food as you order it cooked.   AT HOME MEDICATIONS:  Compazine/Prochlorperazine 10mg  tablet. Take 1 tablet every 6 hours as needed for nausea/vomiting. (This can make you sleepy)   EMLA cream. Apply a quarter size amount to port site 1  hour prior to chemo. Do not rub in. Cover with plastic wrap.    Diarrhea Sheet   If you are having loose stools/diarrhea, please purchase Imodium and begin taking as outlined:  At the first sign of poorly formed or loose stools you should begin taking Imodium (loperamide) 2 mg capsules.  Take two tablets (4mg ) followed by one tablet (2mg ) every 2 hours - DO NOT EXCEED 8 tablets in 24 hours.  If it is bedtime and you are having loose stools, take 2 tablets at bedtime, then 2 tablets every 4 hours until morning.   Always call the [facility] if you are having loose stools/diarrhea that you can't get under control.  Loose stools/diarrhea leads to dehydration (loss of water) in your body.  We have other options of trying to get the loose stools/diarrhea to stop but you must let us know!   Constipation Sheet  Colace - 100 mg capsules - take 2 capsules daily.  If this doesn't help then you can increase to 2 capsules twice daily.  Please call if the above does not work for you. Do not go more than 2 days without a bowel movement.  It is very important that you do not become constipated.  It will make you feel sick to your stomach (nausea) and can cause abdominal pain and vomiting.  Nausea Sheet   Compazine/Prochlorperazine 10mg  tablet. Take 1 tablet every 6 hours as needed for nausea/vomiting (This can make you drowsy).  If you are having persistent nausea (nausea that does not stop) please call the [facility] and let us know the amount of nausea that you are experiencing.  If you begin to vomit, you need to call the [facility] and if it is the weekend and you have vomited more than one time and can't get it to stop-go to the Emergency Room.  Persistent nausea/vomiting can lead to dehydration (loss of fluid in your body) and will make you feel very weak and unwell. Ice chips, sips of clear liquids, foods that are at room temperature, crackers, and toast tend to be better tolerated.   SYMPTOMS  TO REPORT AS SOON AS POSSIBLE AFTER TREATMENT:  FEVER GREATER THAN 100.4 F  CHILLS WITH OR WITHOUT FEVER  NAUSEA AND VOMITING THAT IS NOT CONTROLLED WITH YOUR NAUSEA MEDICATION  UNUSUAL SHORTNESS OF BREATH  UNUSUAL BRUISING OR BLEEDING  TENDERNESS IN MOUTH AND THROAT WITH OR WITHOUT   PRESENCE OF ULCERS  URINARY PROBLEMS  BOWEL PROBLEMS  UNUSUAL RASH      Wear comfortable clothing and clothing appropriate for easy access to any Portacath or PICC line. Let us know if there is anything that we can do to make your therapy better!    What to do if you need assistance after hours or on the weekends: CALL [phone].  HOLD on the line, do not hang up.  You will hear multiple messages but at the end you will be connected with a nurse triage line.  They will contact the doctor if necessary.  Most of the time they will be able to assist you.  Do not call the hospital operator.      I have been informed and understand all of the instructions given to me and have received a copy. I have been instructed to call  the clinic [phone] or my family physician as soon as possible for continued medical care, if indicated. I do not have any more questions at this time but understand that I may call the [facility] or the Patient Navigator at [phone] during office hours should I have questions or need assistance in obtaining follow-up care.

## 2020-05-30 ENCOUNTER — Other Ambulatory Visit: Payer: Self-pay

## 2020-05-30 ENCOUNTER — Inpatient Hospital Stay (HOSPITAL_COMMUNITY): Payer: Medicaid Other | Attending: Hematology

## 2020-05-30 ENCOUNTER — Encounter (HOSPITAL_BASED_OUTPATIENT_CLINIC_OR_DEPARTMENT_OTHER): Payer: Self-pay | Admitting: General Surgery

## 2020-05-30 DIAGNOSIS — Z79899 Other long term (current) drug therapy: Secondary | ICD-10-CM | POA: Insufficient documentation

## 2020-05-30 DIAGNOSIS — C189 Malignant neoplasm of colon, unspecified: Secondary | ICD-10-CM

## 2020-05-30 DIAGNOSIS — E876 Hypokalemia: Secondary | ICD-10-CM | POA: Insufficient documentation

## 2020-05-30 DIAGNOSIS — Z5111 Encounter for antineoplastic chemotherapy: Secondary | ICD-10-CM | POA: Insufficient documentation

## 2020-05-30 DIAGNOSIS — Z933 Colostomy status: Secondary | ICD-10-CM | POA: Insufficient documentation

## 2020-05-30 DIAGNOSIS — D509 Iron deficiency anemia, unspecified: Secondary | ICD-10-CM | POA: Insufficient documentation

## 2020-05-30 DIAGNOSIS — C787 Secondary malignant neoplasm of liver and intrahepatic bile duct: Secondary | ICD-10-CM | POA: Insufficient documentation

## 2020-05-30 DIAGNOSIS — K5900 Constipation, unspecified: Secondary | ICD-10-CM | POA: Insufficient documentation

## 2020-05-30 NOTE — Progress Notes (Signed)
Chemotherapy and immunotherapy education packet given and discussed with pt and significant other in detail. Discussed diagnosis and staging, tx regimen, and intent of tx.  Reviewed chemotherapy and immunotherapy medications and side effects, as well as pre-medications. Instructed on how to manage side effects at home, and when to call the clinic.  Importance of fever/chills discussed with pt and significant other. Discussed precautions to implement at home after receiving tx, as well as self care strategies. Phone numbers provided for clinic during regular working hours, also how to reach the clinic after hours and on weekends. Pt and family provided the opportunity to ask questions - all questions answered to pt's and significant other's satisfaction.

## 2020-06-03 NOTE — Assessment & Plan Note (Signed)
1.  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 -CT chest on 05/02/2020 with no evidence of pulmonary metastatic disease. -CTAP on 05/07/2020 with multiple lesions throughout the liver, largest in the right lobe measuring 3.9 cm, lateral segment left lobe measuring 2.6 cm and inferior right lobe confluent lesion 4.2 x 3.7 cm.  No lymphadenopathy. -CEA on 05/02/2020-60.2.  2.  Iron deficiency anemia: -Feraheme on 04/30/2020 and 05/06/2020.  3.  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 -Smoked for 3 to 4 years and quit. -Mother had cancer, type unknown.  Maternal uncle had lung cancer.  Maternal aunt had lung cancer.    PLAN:  1.  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

## 2020-06-04 ENCOUNTER — Inpatient Hospital Stay (HOSPITAL_COMMUNITY): Payer: Medicaid Other

## 2020-06-04 ENCOUNTER — Other Ambulatory Visit: Payer: Self-pay | Admitting: Hematology and Oncology

## 2020-06-04 ENCOUNTER — Inpatient Hospital Stay (HOSPITAL_COMMUNITY): Payer: Medicaid Other | Admitting: General Practice

## 2020-06-04 ENCOUNTER — Other Ambulatory Visit: Payer: Self-pay

## 2020-06-04 ENCOUNTER — Inpatient Hospital Stay (HOSPITAL_BASED_OUTPATIENT_CLINIC_OR_DEPARTMENT_OTHER): Payer: Medicaid Other | Admitting: Hematology and Oncology

## 2020-06-04 ENCOUNTER — Encounter (HOSPITAL_COMMUNITY): Payer: Self-pay

## 2020-06-04 VITALS — BP 154/77 | HR 77 | Resp 18

## 2020-06-04 DIAGNOSIS — C787 Secondary malignant neoplasm of liver and intrahepatic bile duct: Secondary | ICD-10-CM

## 2020-06-04 DIAGNOSIS — C189 Malignant neoplasm of colon, unspecified: Secondary | ICD-10-CM

## 2020-06-04 DIAGNOSIS — K5900 Constipation, unspecified: Secondary | ICD-10-CM | POA: Diagnosis not present

## 2020-06-04 DIAGNOSIS — Z79899 Other long term (current) drug therapy: Secondary | ICD-10-CM | POA: Diagnosis not present

## 2020-06-04 DIAGNOSIS — Z5111 Encounter for antineoplastic chemotherapy: Secondary | ICD-10-CM | POA: Diagnosis not present

## 2020-06-04 DIAGNOSIS — E876 Hypokalemia: Secondary | ICD-10-CM | POA: Diagnosis not present

## 2020-06-04 DIAGNOSIS — Z933 Colostomy status: Secondary | ICD-10-CM | POA: Diagnosis not present

## 2020-06-04 DIAGNOSIS — D509 Iron deficiency anemia, unspecified: Secondary | ICD-10-CM | POA: Diagnosis not present

## 2020-06-04 LAB — COMPREHENSIVE METABOLIC PANEL
ALT: 24 U/L (ref 0–44)
AST: 44 U/L — ABNORMAL HIGH (ref 15–41)
Albumin: 3.4 g/dL — ABNORMAL LOW (ref 3.5–5.0)
Alkaline Phosphatase: 294 U/L — ABNORMAL HIGH (ref 38–126)
Anion gap: 9 (ref 5–15)
BUN: 15 mg/dL (ref 6–20)
CO2: 28 mmol/L (ref 22–32)
Calcium: 9 mg/dL (ref 8.9–10.3)
Chloride: 95 mmol/L — ABNORMAL LOW (ref 98–111)
Creatinine, Ser: 0.72 mg/dL (ref 0.44–1.00)
GFR, Estimated: >60 mL/min (ref >60)
Glucose, Bld: 116 mg/dL — ABNORMAL HIGH (ref 70–99)
Potassium: 3.3 mmol/L — ABNORMAL LOW (ref 3.5–5.1)
Sodium: 132 mmol/L — ABNORMAL LOW (ref 135–145)
Total Bilirubin: 0.4 mg/dL (ref 0.3–1.2)
Total Protein: 7.5 g/dL (ref 6.5–8.1)

## 2020-06-04 LAB — URINALYSIS, DIPSTICK ONLY
Bilirubin Urine: NEGATIVE
Glucose, UA: NEGATIVE mg/dL
Hgb urine dipstick: NEGATIVE
Ketones, ur: NEGATIVE mg/dL
Leukocytes,Ua: NEGATIVE
Nitrite: NEGATIVE
Protein, ur: NEGATIVE mg/dL
Specific Gravity, Urine: 1.013 (ref 1.005–1.030)
pH: 7 (ref 5.0–8.0)

## 2020-06-04 LAB — CBC WITH DIFFERENTIAL/PLATELET
Abs Immature Granulocytes: 0.02 10*3/uL (ref 0.00–0.07)
Basophils Absolute: 0.1 10*3/uL (ref 0.0–0.1)
Basophils Relative: 1 %
Eosinophils Absolute: 0.1 10*3/uL (ref 0.0–0.5)
Eosinophils Relative: 1 %
HCT: 39.6 % (ref 36.0–46.0)
Hemoglobin: 12.4 g/dL (ref 12.0–15.0)
Immature Granulocytes: 0 %
Lymphocytes Relative: 45 %
Lymphs Abs: 3.4 10*3/uL (ref 0.7–4.0)
MCH: 26.2 pg (ref 26.0–34.0)
MCHC: 31.3 g/dL (ref 30.0–36.0)
MCV: 83.5 fL (ref 80.0–100.0)
Monocytes Absolute: 0.7 10*3/uL (ref 0.1–1.0)
Monocytes Relative: 10 %
Neutro Abs: 3.2 10*3/uL (ref 1.7–7.7)
Neutrophils Relative %: 43 %
Platelets: 257 10*3/uL (ref 150–400)
RBC: 4.74 MIL/uL (ref 3.87–5.11)
WBC: 7.5 10*3/uL (ref 4.0–10.5)
nRBC: 0 % (ref 0.0–0.2)

## 2020-06-04 MED ORDER — LEUCOVORIN CALCIUM INJECTION 350 MG
400.0000 mg/m2 | Freq: Once | INTRAVENOUS | Status: AC
  Administered 2020-06-04: 644 mg via INTRAVENOUS
  Filled 2020-06-04: qty 32.2

## 2020-06-04 MED ORDER — SODIUM CHLORIDE 0.9 % IV SOLN: Freq: Once | INTRAVENOUS | Status: AC

## 2020-06-04 MED ORDER — FLUOROURACIL CHEMO INJECTION 2.5 GM/50ML
400.0000 mg/m2 | Freq: Once | INTRAVENOUS | Status: AC
  Administered 2020-06-04: 650 mg via INTRAVENOUS
  Filled 2020-06-04: qty 13

## 2020-06-04 MED ORDER — POTASSIUM CHLORIDE CRYS ER 20 MEQ PO TBCR
40.0000 meq | EXTENDED_RELEASE_TABLET | Freq: Once | ORAL | Status: AC
  Administered 2020-06-04: 40 meq via ORAL

## 2020-06-04 MED ORDER — SODIUM CHLORIDE 0.9 % IV SOLN: 5.0000 mg/kg | Freq: Once | INTRAVENOUS | Status: DC

## 2020-06-04 MED ORDER — SODIUM CHLORIDE 0.9 % IV SOLN
2400.0000 mg/m2 | INTRAVENOUS | Status: DC
  Administered 2020-06-04: 3850 mg via INTRAVENOUS
  Filled 2020-06-04: qty 77

## 2020-06-04 MED ORDER — DEXTROSE 5 % IV SOLN: Freq: Once | INTRAVENOUS | Status: AC

## 2020-06-04 MED ORDER — PALONOSETRON HCL INJECTION 0.25 MG/5ML
0.2500 mg | Freq: Once | INTRAVENOUS | Status: AC
  Administered 2020-06-04: 0.25 mg via INTRAVENOUS

## 2020-06-04 MED ORDER — PALONOSETRON HCL INJECTION 0.25 MG/5ML
INTRAVENOUS | Status: AC
  Filled 2020-06-04: qty 5

## 2020-06-04 MED ORDER — SODIUM CHLORIDE 0.9 % IV SOLN
5.0000 mg/kg | Freq: Once | INTRAVENOUS | Status: AC
  Administered 2020-06-04: 300 mg via INTRAVENOUS
  Filled 2020-06-04: qty 12

## 2020-06-04 MED ORDER — POTASSIUM CHLORIDE CRYS ER 20 MEQ PO TBCR
EXTENDED_RELEASE_TABLET | ORAL | Status: AC
  Filled 2020-06-04: qty 2

## 2020-06-04 MED ORDER — OXALIPLATIN CHEMO INJECTION 100 MG/20ML
85.0000 mg/m2 | Freq: Once | INTRAVENOUS | Status: AC
  Administered 2020-06-04: 135 mg via INTRAVENOUS
  Filled 2020-06-04: qty 20

## 2020-06-04 MED ORDER — SODIUM CHLORIDE 0.9 % IV SOLN
10.0000 mg | Freq: Once | INTRAVENOUS | Status: AC
  Administered 2020-06-04: 10 mg via INTRAVENOUS
  Filled 2020-06-04: qty 10

## 2020-06-04 NOTE — Patient Instructions (Signed)
[place] at [facility] Discharge Instructions  You were seen today by Dr. Lindi Adie.  He is covering for Dr. Delton Coombes.  He talked with you about your new treatment today. He talked with you about getting imodium for any diarrhea.  He talked with you about the side effects of the new chemotherapy treatment.  If you have any questions or concerns, please don't hesitate to call our office.     Thank you for choosing [place] at [facility] to provide your oncology and hematology care.  To afford each patient quality time with our provider, please arrive at least 15 minutes before your scheduled appointment time.   If you have a lab appointment with the [facility] please come in thru the Main Entrance and check in at the main information desk.  You need to re-schedule your appointment should you arrive 10 or more minutes late.  We strive to give you quality time with our providers, and arriving late affects you and other patients whose appointments are after yours.  Also, if you no show three or more times for appointments you may be dismissed from the clinic at the providers discretion.     Again, thank you for choosing [facility]  Our hope is that these requests will decrease the amount of time that you wait before being seen by our physicians.       _____________________________________________________________  Should you have questions after your visit to [facility], please contact our office at [phone] and follow the prompts.  Our office hours are 8:00 a.m. and 4:30 p.m. Monday - Friday.  Please note that voicemails left after 4:00 p.m. may not be returned until the following business day.  We are closed weekends and major holidays.  You do have access to a nurse 24-7, just call the main number to the clinic [phone] and do not press any options, hold on the line and a nurse will answer the phone.    For  prescription refill requests, have your pharmacy contact our office and allow 72 hours.    Due to Covid, you will need to wear a mask upon entering the hospital. If you do not have a mask, a mask will be given to you at the Main Entrance upon arrival. For doctor visits, patients may have 1 support person age 5 or older with them. For treatment visits, patients can not have anyone with them due to social distancing guidelines and our immunocompromised population.

## 2020-06-04 NOTE — Progress Notes (Signed)
.  The following biosimilar Mvasi ([id number]) has been selected for use in this patient due to no insurance.  Henreitta Leber, PharmD

## 2020-06-04 NOTE — Patient Instructions (Signed)
[facility] Discharge Instructions for Patients Receiving Chemotherapy  Today you received the following chemotherapy agents   To help prevent nausea and vomiting after your treatment, we encourage you to take your nausea medication   If you develop nausea and vomiting that is not controlled by your nausea medication, call the clinic.   BELOW ARE SYMPTOMS THAT SHOULD BE REPORTED IMMEDIATELY:  *FEVER GREATER THAN 100.5 F  *CHILLS WITH OR WITHOUT FEVER  NAUSEA AND VOMITING THAT IS NOT CONTROLLED WITH YOUR NAUSEA MEDICATION  *UNUSUAL SHORTNESS OF BREATH  *UNUSUAL BRUISING OR BLEEDING  TENDERNESS IN MOUTH AND THROAT WITH OR WITHOUT PRESENCE OF ULCERS  *URINARY PROBLEMS  *BOWEL PROBLEMS  UNUSUAL RASH Items with * indicate a potential emergency and should be followed up as soon as possible.  Feel free to call the clinic should you have any questions or concerns. The clinic phone number is [phone].  Please show the CHEMO ALERT CARD at check-in to the Emergency Department and triage nurse.   

## 2020-06-04 NOTE — Progress Notes (Signed)
Patient presents today for treatment.  Was seen by physician.  Labs currently pending.  Vital signs within parameters for treatment.  No significant complaints.

## 2020-06-04 NOTE — Progress Notes (Signed)
Message received from DWilson RN/ Dr. Gudena patient seen by MD, if labs are within parameters proceed with treatment today. D1C1 Avastin/ Folfox.   Reported potassium 3.3 to Dr. Gudena via instant message. Order received to give 40 mEq of Potassium x 1 dose now and provide teaching pertaining to potassium rich foods. Labs within parameters for treatment and reviewed by MD. Proceed with treatment.   Treatment given today per MD orders. Tolerated infusion without adverse affects. Vital signs stable. No complaints at this time. 5FU pump infusing per protocol. RUN noted on screen and verified with the patient. Spill kit and pump checklist given to patient with phone numbers to call with any issues. Understanding verbalized.  Discharged from clinic ambulatory in stable condition. Alert and oriented x 3. F/U with [facility] as scheduled.  

## 2020-06-04 NOTE — Progress Notes (Signed)
Patient Care Team: Patient, No Pcp Per as PCP - General (General Practice) Dishmon, Garwin Brothers, RN as Oncology Nurse Navigator (Oncology)  DIAGNOSIS:  Encounter Diagnosis  Name Primary?  . Metastatic colon cancer to liver ([place])     SUMMARY OF ONCOLOGIC HISTORY: Oncology History  Metastatic colon cancer to liver ([place])  05/16/2020 Initial Diagnosis   Metastatic colon cancer to liver ([place])   06/04/2020 -  Chemotherapy   The patient had palonosetron (ALOXI) injection 0.25 mg, 0.25 mg, Intravenous,  Once, 0 of 4 cycles leucovorin 644 mg in dextrose 5 % 250 mL infusion, 400 mg/m2, Intravenous,  Once, 0 of 4 cycles oxaliplatin (ELOXATIN) 135 mg in dextrose 5 % 500 mL chemo infusion, 85 mg/m2, Intravenous,  Once, 0 of 4 cycles fluorouracil (ADRUCIL) chemo injection 650 mg, 400 mg/m2, Intravenous,  Once, 0 of 4 cycles fluorouracil (ADRUCIL) 3,850 mg in sodium chloride 0.9 % 73 mL chemo infusion, 2,400 mg/m2, Intravenous, 1 Day/Dose, 0 of 4 cycles bevacizumab-bvzr (ZIRABEV) 300 mg in sodium chloride 0.9 % 100 mL chemo infusion, 5 mg/kg, Intravenous,  Once, 0 of 4 cycles  for chemotherapy treatment.      CHIEF COMPLIANT: Cycle 1 FOLFOX bevacizumab  INTERVAL HISTORY: Susan Martin is a 59 year old with above-mentioned history of metastatic colon cancer with liver metastases to is here to receive her first cycle of chemotherapy with FOLFOX and bevacizumab.  Her major complaints today are right upper quadrant abdominal discomfort which is very mild.  The abdominal surgical incisions are healing extremely well.  She does not have any pain or discomfort.  Colostomy is working well.  She is slightly anxious but is ready to start her treatment today.   ALLERGIES:  has No Known Allergies.  MEDICATIONS:  Current Outpatient Medications  Medication Sig Dispense Refill  . acetaminophen (TYLENOL) 500 MG tablet Take 2 tablets (1,000 mg total) by mouth every 6 (six) hours as needed for mild pain, fever or  headache.    Marland Kitchen BEVACIZUMAB IV Inject 5 mg/kg into the vein every 14 (fourteen) days.    . fluorouracil CALGB 62703 in sodium chloride 0.9 % 150 mL Inject 2,400 mg/m2 into the vein over 48 hr.    . fluticasone (FLONASE) 50 MCG/ACT nasal spray Place 1-2 sprays into both nostrils daily as needed for allergies or rhinitis.    Marland Kitchen HYDROcodone-acetaminophen (NORCO/VICODIN) 5-325 MG tablet Take 1 tablet by mouth every 6 (six) hours as needed for moderate pain. 8 tablet 0  . hydrOXYzine (VISTARIL) 25 MG capsule Take 1 capsule (25 mg total) by mouth 3 (three) times daily as needed. Prn anxiety 60 capsule 2  . ibuprofen (ADVIL) 800 MG tablet Take 1 tablet (800 mg total) by mouth every 8 (eight) hours as needed. 15 tablet 0  . LEUCOVORIN CALCIUM IV Inject 400 mg/m2 into the vein every 21 ( twenty-one) days.    . methocarbamol (ROBAXIN) 500 MG tablet Take 2 tablets (1,000 mg total) by mouth every 8 (eight) hours as needed for muscle spasms. 60 tablet 1  . OXALIPLATIN IV Inject 85 mg/m2 into the vein every 14 (fourteen) days.    Marland Kitchen PARoxetine (PAXIL) 40 MG tablet Take 1 tablet (40 mg total) by mouth every morning. 90 tablet 3  . polyethylene glycol (MIRALAX / GLYCOLAX) packet Take 17 g by mouth every other day.    . triamterene-hydrochlorothiazide (DYAZIDE) 37.5-25 MG capsule Take 1 each (1 capsule total) by mouth daily. (Patient taking differently: Take 1 capsule by mouth daily. )  30 capsule 11  . lidocaine-prilocaine (EMLA) cream Apply small amount to port a cath site and cover with plastic wrap 1 hour prior to chemotherapy appointments (Patient not taking: Reported on 06/04/2020) 30 g 3  . prochlorperazine (COMPAZINE) 10 MG tablet Take 1 tablet (10 mg total) by mouth every 6 (six) hours as needed (Nausea or vomiting). (Patient not taking: Reported on 06/04/2020) 30 tablet 1   No current facility-administered medications for this visit.    PHYSICAL EXAMINATION: ECOG PERFORMANCE STATUS: 1 - Symptomatic but  completely ambulatory  Vitals:   06/04/20 0755  BP: (!) 148/71  Pulse: 79  Resp: 18  Temp: (!) 97.2 F (36.2 C)  SpO2: 99%   Filed Weights   06/04/20 0755  Weight: 129 lb 3.2 oz (58.6 kg)    LABORATORY DATA:  I have reviewed the data as listed CMP Latest Ref Rng & Units 06/04/2020 05/28/2020 05/07/2020  Glucose 70 - 99 mg/dL 116(H) 106(H) 105(H)  BUN 6 - 20 mg/dL _0 Creatinine 0.44 - 1.00 mg/dL 0.72 0.60 0.62  Sodium 135 - 145 mmol/L 132(L) 137 132(L)  Potassium 3.5 - 5.1 mmol/L 3.3(L) 3.7 4.0  Chloride 98 - 111 mmol/L 95(L) 100 96(L)  CO2 22 - 32 mmol/L 28 - 26  Calcium 8.9 - 10.3 mg/dL 9.0 - 8.7(L)  Total Protein 6.5 - 8.1 g/dL 7.5 - -  Total Bilirubin 0.3 - 1.2 mg/dL 0.4 - -  Alkaline Phos 38 - 126 U/L 294(H) - -  AST 15 - 41 U/L 44(H) - -  ALT 0 - 44 U/L 24 - -    Lab Results  Component Value Date   WBC 11.3 (H) 05/07/2020   HGB 12.9 05/28/2020   HCT 38.0 05/28/2020   MCV 71.6 (L) 05/07/2020   PLT 456 (H) 05/07/2020   NEUTROABS 12.6 (H) 04/28/2020    ASSESSMENT & PLAN:  Metastatic colon cancer to liver ([place]) 1.  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 -CT chest on 05/02/2020 with no evidence of pulmonary metastatic disease. -CTAP on 05/07/2020 with multiple lesions throughout the liver, largest in the right lobe measuring 3.9 cm, lateral segment left lobe measuring 2.6 cm and inferior right lobe confluent lesion 4.2 x 3.7 cm.  No lymphadenopathy. -CEA on 05/02/2020-60.2.  2.  Iron deficiency anemia: -Feraheme on 04/30/2020 and 05/06/2020.   PLAN:  1.  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 I instructed her to buy  Imodium in case she gets diarrhea.  Provided instructions for how to take the Imodium. I counseled her about the potential adverse effects of chemotherapy and she appears to understand this and is willing to proceed.  2. Mild hypokalemia: Instructed her to take potassium containing foods  Return to clinic in 1 week for toxicity evaluation. She will also come for pump disconnect.   No orders of the defined types were placed in this encounter.  The patient has a good understanding of the overall plan. she agrees with it. she will call with any problems that may develop before the next visit here. Total time spent: 30 mins including face to face time and time spent for planning, charting and co-ordination of care   Harriette Ohara, MD 06/04/20

## 2020-06-04 NOTE — Progress Notes (Signed)
[facility]  Initial Assessment   Susan Martin is a 59 y.o. year old female seen in a treatment room. Clinical Social Work was referred by treatment team for assessment of psychosocial needs.   SDOH (Social Determinants of Health) assessments performed: Yes   Distress Screen completed: No No flowsheet data found.    Family/Social Information:  . Housing Arrangement: patient lives with roommate Susan Martin, stable housing, no concerns . Family members/support persons in your life? Supportive roommate, sister and sons (sons live in [place]) . Transportation concerns: None, family and friends are transporting . Employment: Unemployed - worked as Counselling psychologist at [employer], was laid off several years ago, has been unable to find stable work since then, has struggled due to hearing loss which made communication difficult .   Income source: none but has support from her roommate . Financial concerns: Yes, due to illness and/or loss of work during treatment o Type of concern: Medical bills and general lack of income for needs over and above basic living expenses which are covered by roommate . Food access concerns: none . Religious or spiritual practice: none . Medication Concerns: enrolled in Medication Assistance program, uninsured, is getting colostomy supplies under charity care arrangement w [facility] . Services Currently in place:  [name]; [facility] Susan Martin) submitted Medicaid application to [place] 12/7.  CSW submitted referral to [facility] for disability assistance on 12.7 also. Patient given information on Set designer in order to explore options on ACA exchanges.   Coping/ Adjustment to diagnosis: . Patient understands treatment plan and what happens next? Newly diagnosed with Stage IV colon cancer metastatic to liver.  Diagnosed approx 4-6 weeks ago.  Beginning chemotherapy.  Overwhelmed w the diagnosis and  the pace of events. "All put on me at one time", was not expecting this diagnosis.  Went to ED initially due to abdominal pain, was ultimately diagnosed with cancer.  Hopes to fight "hard for my children and grandchildren."   . Concerns about diagnosis and/or treatment: How I will pay for the services I need . Patient reported stressors: Finances and Children has adult children and also has grandchildren, aware this impacts them as well as her . Hopes and priorities: "fighting this cancer" . Patient enjoys gardening and time with family/ friends . Current coping skills/ strengths: Motivation for treatment/growth and Supportive family/friends    SUMMARY: Current SDOH Barriers:  . Financial constraints related to lack of insurance  Interventions: . Discussed common feeling and emotions when being diagnosed with cancer, and the importance of support during treatment . Informed patient of the support team roles and support services at [facility] . Provided CSW contact information and encouraged patient to call with any questions or concerns . Referred patient to [facility], [place]  . Provided information on Support Center and [employer] programming/support   Follow Up Plan: Patient will contact CSW with any support or resource needs Patient verbalizes understanding of plan: Yes    [place] [facility], [place], [place] Social Worker Phone:  [phone]

## 2020-06-04 NOTE — Patient Instructions (Signed)
[facility] at [facility] Discharge Instructions  Labs drawn from portacath today   Thank you for choosing [facility] at [facility] to provide your oncology and hematology care.  To afford each patient quality time with our provider, please arrive at least 15 minutes before your scheduled appointment time.   If you have a lab appointment with the Cancer Center please come in thru the Main Entrance and check in at the main information desk.  You need to re-schedule your appointment should you arrive 10 or more minutes late.  We strive to give you quality time with our providers, and arriving late affects you and other patients whose appointments are after yours.  Also, if you no show three or more times for appointments you may be dismissed from the clinic at the providers discretion.     Again, thank you for choosing [facility].  Our hope is that these requests will decrease the amount of time that you wait before being seen by our physicians.       _____________________________________________________________  Should you have questions after your visit to [facility], please contact our office at [phone] and follow the prompts.  Our office hours are 8:00 a.m. and 4:30 p.m. Monday - Friday.  Please note that voicemails left after 4:00 p.m. may not be returned until the following business day.  We are closed weekends and major holidays.  You do have access to a nurse 24-7, just call the main number to the clinic [phone] and do not press any options, hold on the line and a nurse will answer the phone.    For prescription refill requests, have your pharmacy contact our office and allow 72 hours.    Due to Covid, you will need to wear a mask upon entering the hospital. If you do not have a mask, a mask will be given to you at the Main Entrance upon arrival. For doctor visits, patients may have 1 support person age 18  or older with them. For treatment visits, patients can not have anyone with them due to social distancing guidelines and our immunocompromised population.     

## 2020-06-05 LAB — CEA: CEA: 24.8 ng/mL — ABNORMAL HIGH (ref 0.0–4.7)

## 2020-06-05 NOTE — Progress Notes (Signed)
06/05/20 1537  24 hour call back.  No answer left message for patient to return our call.

## 2020-06-05 NOTE — Progress Notes (Signed)
..  The following Medication: Mvasi is approved for drug replacement program by [place]. The enrollment period is from 06/05/2020 to 06/05/2021.  Reason for Assistance: Self Pay. ID: [id number] First DOS:06/04/2020.

## 2020-06-06 ENCOUNTER — Inpatient Hospital Stay (HOSPITAL_COMMUNITY): Payer: Medicaid Other

## 2020-06-06 VITALS — BP 131/73 | HR 88 | Temp 97.5°F | Resp 18

## 2020-06-06 DIAGNOSIS — C787 Secondary malignant neoplasm of liver and intrahepatic bile duct: Secondary | ICD-10-CM

## 2020-06-06 DIAGNOSIS — C189 Malignant neoplasm of colon, unspecified: Secondary | ICD-10-CM

## 2020-06-06 DIAGNOSIS — Z5111 Encounter for antineoplastic chemotherapy: Secondary | ICD-10-CM | POA: Diagnosis not present

## 2020-06-06 MED ORDER — HEPARIN SOD (PORK) LOCK FLUSH 100 UNIT/ML IV SOLN
500.0000 [IU] | Freq: Once | INTRAVENOUS | Status: AC | PRN
  Administered 2020-06-06: 500 [IU]

## 2020-06-06 MED ORDER — SODIUM CHLORIDE 0.9% FLUSH
10.0000 mL | INTRAVENOUS | Status: DC | PRN
  Administered 2020-06-06: 10 mL

## 2020-06-06 NOTE — Progress Notes (Signed)
Susan Martin presents today for 5FU pump discontinuation. Pump discontinued and port flushed with NS and heparin per protocol. Port deaccessed and bandaid applied. VSS. Discharged in satisfactory condition with follow up instructions.

## 2020-06-11 ENCOUNTER — Encounter (HOSPITAL_COMMUNITY): Payer: Self-pay | Admitting: Hematology

## 2020-06-12 NOTE — Progress Notes (Signed)
Patient Care Team: Patient, No Pcp Per as PCP - General (General Practice) Dishmon, Garwin Brothers, RN as Oncology Nurse Navigator (Oncology)  DIAGNOSIS:  No diagnosis found.  SUMMARY OF ONCOLOGIC HISTORY: Oncology History  Metastatic colon cancer to liver ([place])  05/16/2020 Initial Diagnosis   Metastatic colon cancer to liver ([place])   06/03/2020 Genetic Testing   Foundation One:     06/04/2020 -  Chemotherapy   The patient had palonosetron (ALOXI) injection 0.25 mg, 0.25 mg, Intravenous,  Once, 1 of 4 cycles Administration: 0.25 mg (06/04/2020) leucovorin 644 mg in dextrose 5 % 250 mL infusion, 400 mg/m2 = 644 mg, Intravenous,  Once, 1 of 4 cycles Administration: 644 mg (06/04/2020) oxaliplatin (ELOXATIN) 135 mg in dextrose 5 % 500 mL chemo infusion, 85 mg/m2 = 135 mg, Intravenous,  Once, 1 of 4 cycles Administration: 135 mg (06/04/2020) fluorouracil (ADRUCIL) chemo injection 650 mg, 400 mg/m2 = 650 mg, Intravenous,  Once, 1 of 4 cycles Administration: 650 mg (06/04/2020) fluorouracil (ADRUCIL) 3,850 mg in sodium chloride 0.9 % 73 mL chemo infusion, 2,400 mg/m2 = 3,850 mg, Intravenous, 1 Day/Dose, 1 of 4 cycles Administration: 3,850 mg (06/04/2020) bevacizumab-awwb (MVASI) 300 mg in sodium chloride 0.9 % 100 mL chemo infusion, 5 mg/kg = 300 mg (100 % of original dose 5 mg/kg), Intravenous,  Once, 1 of 4 cycles Dose modification: 5 mg/kg (original dose 5 mg/kg, Cycle 1) Administration: 300 mg (06/04/2020) bevacizumab-bvzr (ZIRABEV) 300 mg in sodium chloride 0.9 % 100 mL chemo infusion, 5 mg/kg = 300 mg, Intravenous,  Once, 1 of 1 cycle  for chemotherapy treatment.      CHIEF COMPLIANT: Cycle 1 FOLFOX bevacizumab  INTERVAL HISTORY: Susan Martin is a 59 year old with above-mentioned history of metastatic colon cancer with liver metastases to is here for follow up after her first cycle of chemotherapy with FOLFOX and bevacizumab. She is doing really well. No nausea, vomiting or diarrhea She  has some constipation, uses miralax Wound on the abdomen is healing well. Cold hypersensitivity reported.  ALLERGIES:  has No Known Allergies.  MEDICATIONS:  Current Outpatient Medications  Medication Sig Dispense Refill  . BEVACIZUMAB IV Inject 5 mg/kg into the vein every 14 (fourteen) days.    . fluorouracil CALGB 81856 in sodium chloride 0.9 % 150 mL Inject 2,400 mg/m2 into the vein over 48 hr.    . hydrOXYzine (VISTARIL) 25 MG capsule Take 1 capsule (25 mg total) by mouth 3 (three) times daily as needed. Prn anxiety 60 capsule 2  . LEUCOVORIN CALCIUM IV Inject 400 mg/m2 into the vein every 21 ( twenty-one) days.    . OXALIPLATIN IV Inject 85 mg/m2 into the vein every 14 (fourteen) days.    Marland Kitchen PARoxetine (PAXIL) 40 MG tablet Take 1 tablet (40 mg total) by mouth every morning. 90 tablet 3  . polyethylene glycol (MIRALAX / GLYCOLAX) packet Take 17 g by mouth every other day.    . triamterene-hydrochlorothiazide (DYAZIDE) 37.5-25 MG capsule Take 1 each (1 capsule total) by mouth daily. (Patient taking differently: Take 1 capsule by mouth daily.) 30 capsule 11  . acetaminophen (TYLENOL) 500 MG tablet Take 2 tablets (1,000 mg total) by mouth every 6 (six) hours as needed for mild pain, fever or headache. (Patient not taking: Reported on 06/13/2020)    . fluticasone (FLONASE) 50 MCG/ACT nasal spray Place 1-2 sprays into both nostrils daily as needed for allergies or rhinitis. (Patient not taking: Reported on 06/13/2020)    . HYDROcodone-acetaminophen (NORCO/VICODIN) 5-325 MG  tablet Take 1 tablet by mouth every 6 (six) hours as needed for moderate pain. (Patient not taking: Reported on 06/13/2020) 8 tablet 0  . ibuprofen (ADVIL) 800 MG tablet Take 1 tablet (800 mg total) by mouth every 8 (eight) hours as needed. (Patient not taking: Reported on 06/13/2020) 15 tablet 0  . lidocaine-prilocaine (EMLA) cream Apply small amount to port a cath site and cover with plastic wrap 1 hour prior to chemotherapy  appointments (Patient not taking: No sig reported) 30 g 3  . prochlorperazine (COMPAZINE) 10 MG tablet Take 1 tablet (10 mg total) by mouth every 6 (six) hours as needed (Nausea or vomiting). (Patient not taking: No sig reported) 30 tablet 1   No current facility-administered medications for this visit.    PHYSICAL EXAMINATION: ECOG PERFORMANCE STATUS: 1 - Symptomatic but completely ambulatory  Vitals:   06/13/20 0853  BP: (!) 144/74  Pulse: 84  Resp: 18  Temp: 97.8 F (36.6 C)  SpO2: 97%   Filed Weights   06/13/20 0853  Weight: 129 lb (58.5 kg)    Physical Exam Constitutional:      Appearance: Normal appearance.  HENT:     Head: Normocephalic and atraumatic.  Cardiovascular:     Rate and Rhythm: Normal rate and regular rhythm.  Pulmonary:     Effort: Pulmonary effort is normal.     Breath sounds: Normal breath sounds.  Abdominal:     General: Abdomen is flat. Bowel sounds are normal.     Palpations: Abdomen is soft.  Musculoskeletal:        General: Normal range of motion.  Skin:    General: Skin is warm and dry.  Neurological:     Mental Status: She is alert.     LABORATORY DATA:  I have reviewed the data as listed CMP Latest Ref Rng & Units 06/13/2020 06/04/2020 05/28/2020  Glucose 70 - 99 mg/dL 120(H) 116(H) 106(H)  BUN 6 - 20 mg/dL _0 Creatinine 0.44 - 1.00 mg/dL 0.77 0.72 0.60  Sodium 135 - 145 mmol/L 135 132(L) 137  Potassium 3.5 - 5.1 mmol/L 3.8 3.3(L) 3.7  Chloride 98 - 111 mmol/L 97(L) 95(L) 100  CO2 22 - 32 mmol/L 29 28 -  Calcium 8.9 - 10.3 mg/dL 9.5 9.0 -  Total Protein 6.5 - 8.1 g/dL 8.1 7.5 -  Total Bilirubin 0.3 - 1.2 mg/dL 0.3 0.4 -  Alkaline Phos 38 - 126 U/L 270(H) 294(H) -  AST 15 - 41 U/L 36 44(H) -  ALT 0 - 44 U/L 21 24 -    Lab Results  Component Value Date   WBC 6.4 06/13/2020   HGB 13.4 06/13/2020   HCT 41.7 06/13/2020   MCV 84.8 06/13/2020   PLT 264 06/13/2020   NEUTROABS 3.5 06/13/2020    ASSESSMENT & PLAN:   Metastatic colon cancer to liver ([place]) 1.  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 -CT chest on 05/02/2020 with no evidence of pulmonary metastatic disease. -CTAP on 05/07/2020 with multiple lesions throughout the liver, largest in the right lobe measuring 3.9 cm, lateral segment left lobe measuring 2.6 cm and inferior right lobe confluent lesion 4.2 x 3.7 cm.  No lymphadenopathy. -CEA on 05/02/2020-60.2. - Tolerated C 1 well, labs satisfactory  2.  Iron deficiency anemia: Hb normal. -Feraheme on  04/30/2020 and 05/06/2020.   PLAN:  1.  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 ROS and PE satisfactory RTC in 3 weeks before FU for C3. Labs before appointment next week.  2. Mild hypokalemia: Instructed her to take potassium containing foods  No orders of the defined types were placed in this encounter.   Total time spent: 30 mins including face to face time and time spent for planning, charting and co-ordination of care   Benay Pike, MD 06/13/20

## 2020-06-13 ENCOUNTER — Other Ambulatory Visit: Payer: Self-pay

## 2020-06-13 ENCOUNTER — Inpatient Hospital Stay (HOSPITAL_COMMUNITY): Payer: Medicaid Other

## 2020-06-13 ENCOUNTER — Inpatient Hospital Stay (HOSPITAL_BASED_OUTPATIENT_CLINIC_OR_DEPARTMENT_OTHER): Payer: Medicaid Other | Admitting: Hematology and Oncology

## 2020-06-13 ENCOUNTER — Encounter (HOSPITAL_COMMUNITY): Payer: Self-pay | Admitting: Hematology and Oncology

## 2020-06-13 ENCOUNTER — Other Ambulatory Visit (HOSPITAL_COMMUNITY): Payer: Self-pay | Admitting: *Deleted

## 2020-06-13 VITALS — BP 144/74 | HR 84 | Temp 97.8°F | Resp 18 | Wt 129.0 lb

## 2020-06-13 DIAGNOSIS — Z5111 Encounter for antineoplastic chemotherapy: Secondary | ICD-10-CM | POA: Diagnosis not present

## 2020-06-13 DIAGNOSIS — C787 Secondary malignant neoplasm of liver and intrahepatic bile duct: Secondary | ICD-10-CM

## 2020-06-13 DIAGNOSIS — C189 Malignant neoplasm of colon, unspecified: Secondary | ICD-10-CM

## 2020-06-13 LAB — CBC WITH DIFFERENTIAL/PLATELET
Abs Immature Granulocytes: 0.01 10*3/uL (ref 0.00–0.07)
Basophils Absolute: 0 10*3/uL (ref 0.0–0.1)
Basophils Relative: 1 %
Eosinophils Absolute: 0.1 10*3/uL (ref 0.0–0.5)
Eosinophils Relative: 2 %
HCT: 41.7 % (ref 36.0–46.0)
Hemoglobin: 13.4 g/dL (ref 12.0–15.0)
Immature Granulocytes: 0 %
Lymphocytes Relative: 37 %
Lymphs Abs: 2.4 10*3/uL (ref 0.7–4.0)
MCH: 27.2 pg (ref 26.0–34.0)
MCHC: 32.1 g/dL (ref 30.0–36.0)
MCV: 84.8 fL (ref 80.0–100.0)
Monocytes Absolute: 0.4 10*3/uL (ref 0.1–1.0)
Monocytes Relative: 6 %
Neutro Abs: 3.5 10*3/uL (ref 1.7–7.7)
Neutrophils Relative %: 54 %
Platelets: 264 10*3/uL (ref 150–400)
RBC: 4.92 MIL/uL (ref 3.87–5.11)
WBC: 6.4 10*3/uL (ref 4.0–10.5)
nRBC: 0 % (ref 0.0–0.2)

## 2020-06-13 LAB — COMPREHENSIVE METABOLIC PANEL
ALT: 21 U/L (ref 0–44)
AST: 36 U/L (ref 15–41)
Albumin: 3.7 g/dL (ref 3.5–5.0)
Alkaline Phosphatase: 270 U/L — ABNORMAL HIGH (ref 38–126)
Anion gap: 9 (ref 5–15)
BUN: 15 mg/dL (ref 6–20)
CO2: 29 mmol/L (ref 22–32)
Calcium: 9.5 mg/dL (ref 8.9–10.3)
Chloride: 97 mmol/L — ABNORMAL LOW (ref 98–111)
Creatinine, Ser: 0.77 mg/dL (ref 0.44–1.00)
GFR, Estimated: >60 mL/min (ref >60)
Glucose, Bld: 120 mg/dL — ABNORMAL HIGH (ref 70–99)
Potassium: 3.8 mmol/L (ref 3.5–5.1)
Sodium: 135 mmol/L (ref 135–145)
Total Bilirubin: 0.3 mg/dL (ref 0.3–1.2)
Total Protein: 8.1 g/dL (ref 6.5–8.1)

## 2020-06-13 MED ORDER — HYDROCODONE-ACETAMINOPHEN 5-325 MG PO TABS: 1.0000 | ORAL_TABLET | Freq: Four times a day (QID) | ORAL | 0 refills | Status: DC | PRN

## 2020-06-13 MED ORDER — PAROXETINE HCL 40 MG PO TABS: 40.0000 mg | ORAL_TABLET | ORAL | 3 refills | Status: DC

## 2020-06-18 ENCOUNTER — Other Ambulatory Visit (HOSPITAL_COMMUNITY): Payer: Self-pay

## 2020-06-18 ENCOUNTER — Other Ambulatory Visit: Payer: Self-pay

## 2020-06-18 ENCOUNTER — Inpatient Hospital Stay (HOSPITAL_COMMUNITY): Payer: Medicaid Other

## 2020-06-18 ENCOUNTER — Encounter (HOSPITAL_COMMUNITY): Payer: Self-pay

## 2020-06-18 ENCOUNTER — Ambulatory Visit (HOSPITAL_COMMUNITY): Payer: Self-pay | Admitting: Hematology and Oncology

## 2020-06-18 VITALS — BP 150/66 | HR 67 | Temp 97.1°F | Resp 18

## 2020-06-18 DIAGNOSIS — Z5111 Encounter for antineoplastic chemotherapy: Secondary | ICD-10-CM | POA: Diagnosis not present

## 2020-06-18 DIAGNOSIS — C787 Secondary malignant neoplasm of liver and intrahepatic bile duct: Secondary | ICD-10-CM

## 2020-06-18 DIAGNOSIS — C189 Malignant neoplasm of colon, unspecified: Secondary | ICD-10-CM

## 2020-06-18 LAB — CBC WITH DIFFERENTIAL/PLATELET
Band Neutrophils: 1 %
Basophils Absolute: 0.1 10*3/uL (ref 0.0–0.1)
Basophils Relative: 1 %
Eosinophils Absolute: 0.2 10*3/uL (ref 0.0–0.5)
Eosinophils Relative: 3 %
HCT: 35.9 % — ABNORMAL LOW (ref 36.0–46.0)
Hemoglobin: 11.7 g/dL — ABNORMAL LOW (ref 12.0–15.0)
Lymphocytes Relative: 42 %
Lymphs Abs: 2.5 10*3/uL (ref 0.7–4.0)
MCH: 27.5 pg (ref 26.0–34.0)
MCHC: 32.6 g/dL (ref 30.0–36.0)
MCV: 84.3 fL (ref 80.0–100.0)
Monocytes Absolute: 0.6 10*3/uL (ref 0.1–1.0)
Monocytes Relative: 11 %
Neutro Abs: 2.5 10*3/uL (ref 1.7–7.7)
Neutrophils Relative %: 42 %
Platelets: 209 10*3/uL (ref 150–400)
RBC: 4.26 MIL/uL (ref 3.87–5.11)
WBC: 5.9 10*3/uL (ref 4.0–10.5)
nRBC: 0 % (ref 0.0–0.2)

## 2020-06-18 LAB — COMPREHENSIVE METABOLIC PANEL
ALT: 18 U/L (ref 0–44)
AST: 31 U/L (ref 15–41)
Albumin: 3.2 g/dL — ABNORMAL LOW (ref 3.5–5.0)
Alkaline Phosphatase: 190 U/L — ABNORMAL HIGH (ref 38–126)
Anion gap: 9 (ref 5–15)
BUN: 12 mg/dL (ref 6–20)
CO2: 28 mmol/L (ref 22–32)
Calcium: 8.8 mg/dL — ABNORMAL LOW (ref 8.9–10.3)
Chloride: 98 mmol/L (ref 98–111)
Creatinine, Ser: 0.62 mg/dL (ref 0.44–1.00)
GFR, Estimated: >60 mL/min (ref >60)
Glucose, Bld: 88 mg/dL (ref 70–99)
Potassium: 4.1 mmol/L (ref 3.5–5.1)
Sodium: 135 mmol/L (ref 135–145)
Total Bilirubin: 0.5 mg/dL (ref 0.3–1.2)
Total Protein: 7.2 g/dL (ref 6.5–8.1)

## 2020-06-18 MED ORDER — FLUOROURACIL CHEMO INJECTION 2.5 GM/50ML
400.0000 mg/m2 | Freq: Once | INTRAVENOUS | Status: AC
  Administered 2020-06-18: 650 mg via INTRAVENOUS
  Filled 2020-06-18: qty 13

## 2020-06-18 MED ORDER — DEXTROSE 5 % IV SOLN
Freq: Once | INTRAVENOUS | Status: AC
Start: 2020-06-18 — End: 2020-06-18

## 2020-06-18 MED ORDER — SODIUM CHLORIDE 0.9 % IV SOLN
10.0000 mg | Freq: Once | INTRAVENOUS | Status: AC
  Administered 2020-06-18: 10 mg via INTRAVENOUS
  Filled 2020-06-18: qty 10

## 2020-06-18 MED ORDER — SODIUM CHLORIDE 0.9 % IV SOLN: Freq: Once | INTRAVENOUS | Status: AC

## 2020-06-18 MED ORDER — LEUCOVORIN CALCIUM INJECTION 350 MG
400.0000 mg/m2 | Freq: Once | INTRAVENOUS | Status: AC
  Administered 2020-06-18: 644 mg via INTRAVENOUS
  Filled 2020-06-18: qty 32.2

## 2020-06-18 MED ORDER — PALONOSETRON HCL INJECTION 0.25 MG/5ML
0.2500 mg | Freq: Once | INTRAVENOUS | Status: AC
  Administered 2020-06-18: 0.25 mg via INTRAVENOUS

## 2020-06-18 MED ORDER — DEXTROSE 5 % IV SOLN: Freq: Once | INTRAVENOUS | Status: AC

## 2020-06-18 MED ORDER — SODIUM CHLORIDE 0.9 % IV SOLN
2400.0000 mg/m2 | INTRAVENOUS | Status: DC
  Administered 2020-06-18: 3850 mg via INTRAVENOUS
  Filled 2020-06-18: qty 77

## 2020-06-18 MED ORDER — PALONOSETRON HCL INJECTION 0.25 MG/5ML
INTRAVENOUS | Status: AC
  Filled 2020-06-18: qty 5

## 2020-06-18 MED ORDER — OXALIPLATIN CHEMO INJECTION 100 MG/20ML
85.0000 mg/m2 | Freq: Once | INTRAVENOUS | Status: AC
  Administered 2020-06-18: 135 mg via INTRAVENOUS
  Filled 2020-06-18: qty 20

## 2020-06-18 MED ORDER — SODIUM CHLORIDE 0.9% FLUSH: 10.0000 mL | INTRAVENOUS | Status: DC | PRN

## 2020-06-18 MED ORDER — SODIUM CHLORIDE 0.9 % IV SOLN
5.0000 mg/kg | Freq: Once | INTRAVENOUS | Status: AC
  Administered 2020-06-18: 300 mg via INTRAVENOUS
  Filled 2020-06-18: qty 12

## 2020-06-18 NOTE — Progress Notes (Signed)
Pt here for D1C2 of avastin and FOLFOX.  ALK PHOS 190, lower than previous.  Dr Chryl Heck okay for treatment today.  Tolerated treatment well today without incidence.  Discharged ambulatory with ambulatory 64f pump in stable condition.  Vital signs stable prior to discharge.

## 2020-06-18 NOTE — Patient Instructions (Signed)
[facility] Discharge Instructions for Patients Receiving Chemotherapy  Today you received the following chemotherapy agents   To help prevent nausea and vomiting after your treatment, we encourage you to take your nausea medication   If you develop nausea and vomiting that is not controlled by your nausea medication, call the clinic.   BELOW ARE SYMPTOMS THAT SHOULD BE REPORTED IMMEDIATELY:  *FEVER GREATER THAN 100.5 F  *CHILLS WITH OR WITHOUT FEVER  NAUSEA AND VOMITING THAT IS NOT CONTROLLED WITH YOUR NAUSEA MEDICATION  *UNUSUAL SHORTNESS OF BREATH  *UNUSUAL BRUISING OR BLEEDING  TENDERNESS IN MOUTH AND THROAT WITH OR WITHOUT PRESENCE OF ULCERS  *URINARY PROBLEMS  *BOWEL PROBLEMS  UNUSUAL RASH Items with * indicate a potential emergency and should be followed up as soon as possible.  Feel free to call the clinic should you have any questions or concerns. The clinic phone number is [phone].  Please show the CHEMO ALERT CARD at check-in to the Emergency Department and triage nurse.   

## 2020-06-18 NOTE — Progress Notes (Signed)
Patients port flushed without difficulty.  Good blood return noted with no bruising or swelling noted at site.  Transparent dressing applied.  Patient left accessed for treatment.

## 2020-06-20 ENCOUNTER — Inpatient Hospital Stay (HOSPITAL_COMMUNITY): Payer: Medicaid Other

## 2020-06-20 VITALS — BP 141/78 | HR 74 | Resp 18

## 2020-06-20 DIAGNOSIS — C189 Malignant neoplasm of colon, unspecified: Secondary | ICD-10-CM

## 2020-06-20 DIAGNOSIS — C787 Secondary malignant neoplasm of liver and intrahepatic bile duct: Secondary | ICD-10-CM

## 2020-06-20 DIAGNOSIS — Z5111 Encounter for antineoplastic chemotherapy: Secondary | ICD-10-CM | POA: Diagnosis not present

## 2020-06-20 MED ORDER — SODIUM CHLORIDE 0.9% FLUSH
10.0000 mL | INTRAVENOUS | Status: DC | PRN
  Administered 2020-06-20: 10 mL

## 2020-06-20 MED ORDER — HEPARIN SOD (PORK) LOCK FLUSH 100 UNIT/ML IV SOLN
500.0000 [IU] | Freq: Once | INTRAVENOUS | Status: AC | PRN
  Administered 2020-06-20: 500 [IU]

## 2020-06-20 NOTE — Patient Instructions (Signed)
[place] at [facility]  Discharge Instructions:  5FU pump discontinued today. Follow up as previously scheduled. _______________________________________________________________  Thank you for choosing [place] at [facility] to provide your oncology and hematology care.  To afford each patient quality time with our providers, please arrive at least 15 minutes before your scheduled appointment.  You need to re-schedule your appointment if you arrive 10 or more minutes late.  We strive to give you quality time with our providers, and arriving late affects you and other patients whose appointments are after yours.  Also, if you no show three or more times for appointments you may be dismissed from the clinic.  Again, thank you for choosing [place] at [place] hope is that these requests will allow you access to exceptional care and in a timely manner. _______________________________________________________________  If you have questions after your visit, please contact our office at [phone] between the hours of 8:30 a.m. and 5:00 p.m. Voicemails left after 4:30 p.m. will not be returned until the following business day. _______________________________________________________________  For prescription refill requests, have your pharmacy contact our office. _______________________________________________________________  Recommendations made by the consultant and any test results will be sent to your referring physician. _______________________________________________________________

## 2020-06-20 NOTE — Progress Notes (Signed)
Thomas Cowell presents today for 5FU pump discontinuation. Pump discontinued and port flushed with NS and heparin per protocol. Port deaccessed and bandaid applied. VSS. Discharged in satisfactory condition with follow up instructions.  

## 2020-07-02 ENCOUNTER — Inpatient Hospital Stay (HOSPITAL_COMMUNITY): Payer: Medicaid Other | Attending: Hematology

## 2020-07-02 ENCOUNTER — Ambulatory Visit (HOSPITAL_COMMUNITY): Payer: Self-pay | Admitting: Hematology

## 2020-07-02 ENCOUNTER — Encounter (HOSPITAL_COMMUNITY): Payer: Self-pay | Admitting: General Practice

## 2020-07-02 ENCOUNTER — Other Ambulatory Visit (HOSPITAL_COMMUNITY): Payer: Self-pay

## 2020-07-02 ENCOUNTER — Inpatient Hospital Stay (HOSPITAL_BASED_OUTPATIENT_CLINIC_OR_DEPARTMENT_OTHER): Payer: Medicaid Other | Admitting: Hematology

## 2020-07-02 VITALS — BP 133/68 | HR 68 | Temp 96.9°F | Resp 18

## 2020-07-02 VITALS — BP 131/61 | HR 74 | Temp 96.8°F | Resp 18 | Wt 135.5 lb

## 2020-07-02 DIAGNOSIS — D509 Iron deficiency anemia, unspecified: Secondary | ICD-10-CM | POA: Insufficient documentation

## 2020-07-02 DIAGNOSIS — C187 Malignant neoplasm of sigmoid colon: Secondary | ICD-10-CM | POA: Insufficient documentation

## 2020-07-02 DIAGNOSIS — Z5111 Encounter for antineoplastic chemotherapy: Secondary | ICD-10-CM | POA: Insufficient documentation

## 2020-07-02 DIAGNOSIS — R911 Solitary pulmonary nodule: Secondary | ICD-10-CM | POA: Insufficient documentation

## 2020-07-02 DIAGNOSIS — C787 Secondary malignant neoplasm of liver and intrahepatic bile duct: Secondary | ICD-10-CM | POA: Diagnosis not present

## 2020-07-02 DIAGNOSIS — Z87891 Personal history of nicotine dependence: Secondary | ICD-10-CM | POA: Diagnosis not present

## 2020-07-02 DIAGNOSIS — F418 Other specified anxiety disorders: Secondary | ICD-10-CM | POA: Insufficient documentation

## 2020-07-02 DIAGNOSIS — C189 Malignant neoplasm of colon, unspecified: Secondary | ICD-10-CM

## 2020-07-02 DIAGNOSIS — Z90722 Acquired absence of ovaries, bilateral: Secondary | ICD-10-CM | POA: Insufficient documentation

## 2020-07-02 DIAGNOSIS — I10 Essential (primary) hypertension: Secondary | ICD-10-CM | POA: Diagnosis not present

## 2020-07-02 DIAGNOSIS — Z809 Family history of malignant neoplasm, unspecified: Secondary | ICD-10-CM | POA: Insufficient documentation

## 2020-07-02 DIAGNOSIS — Z90710 Acquired absence of both cervix and uterus: Secondary | ICD-10-CM | POA: Diagnosis not present

## 2020-07-02 DIAGNOSIS — Z801 Family history of malignant neoplasm of trachea, bronchus and lung: Secondary | ICD-10-CM | POA: Insufficient documentation

## 2020-07-02 LAB — COMPREHENSIVE METABOLIC PANEL
ALT: 15 U/L (ref 0–44)
AST: 19 U/L (ref 15–41)
Albumin: 3 g/dL — ABNORMAL LOW (ref 3.5–5.0)
Alkaline Phosphatase: 171 U/L — ABNORMAL HIGH (ref 38–126)
Anion gap: 10 (ref 5–15)
BUN: 9 mg/dL (ref 6–20)
CO2: 27 mmol/L (ref 22–32)
Calcium: 8.7 mg/dL — ABNORMAL LOW (ref 8.9–10.3)
Chloride: 93 mmol/L — ABNORMAL LOW (ref 98–111)
Creatinine, Ser: 0.73 mg/dL (ref 0.44–1.00)
GFR, Estimated: >60 mL/min (ref >60)
Glucose, Bld: 140 mg/dL — ABNORMAL HIGH (ref 70–99)
Potassium: 3.7 mmol/L (ref 3.5–5.1)
Sodium: 130 mmol/L — ABNORMAL LOW (ref 135–145)
Total Bilirubin: 0.4 mg/dL (ref 0.3–1.2)
Total Protein: 7.4 g/dL (ref 6.5–8.1)

## 2020-07-02 LAB — CBC WITH DIFFERENTIAL/PLATELET
Abs Immature Granulocytes: 0.01 10*3/uL (ref 0.00–0.07)
Basophils Absolute: 0 10*3/uL (ref 0.0–0.1)
Basophils Relative: 0 %
Eosinophils Absolute: 0.1 10*3/uL (ref 0.0–0.5)
Eosinophils Relative: 1 %
HCT: 37 % (ref 36.0–46.0)
Hemoglobin: 12 g/dL (ref 12.0–15.0)
Immature Granulocytes: 0 %
Lymphocytes Relative: 40 %
Lymphs Abs: 2.3 10*3/uL (ref 0.7–4.0)
MCH: 27.8 pg (ref 26.0–34.0)
MCHC: 32.4 g/dL (ref 30.0–36.0)
MCV: 85.6 fL (ref 80.0–100.0)
Monocytes Absolute: 0.9 10*3/uL (ref 0.1–1.0)
Monocytes Relative: 15 %
Neutro Abs: 2.5 10*3/uL (ref 1.7–7.7)
Neutrophils Relative %: 44 %
Platelets: 239 10*3/uL (ref 150–400)
RBC: 4.32 MIL/uL (ref 3.87–5.11)
RDW: 19 % — ABNORMAL HIGH (ref 11.5–15.5)
WBC: 5.9 10*3/uL (ref 4.0–10.5)
nRBC: 0 % (ref 0.0–0.2)

## 2020-07-02 LAB — URINALYSIS, DIPSTICK ONLY
Bilirubin Urine: NEGATIVE
Glucose, UA: NEGATIVE mg/dL
Hgb urine dipstick: NEGATIVE
Ketones, ur: NEGATIVE mg/dL
Nitrite: NEGATIVE
Protein, ur: NEGATIVE mg/dL
Specific Gravity, Urine: 1.006 (ref 1.005–1.030)
pH: 8 (ref 5.0–8.0)

## 2020-07-02 MED ORDER — FLUOROURACIL CHEMO INJECTION 2.5 GM/50ML
400.0000 mg/m2 | Freq: Once | INTRAVENOUS | Status: AC
  Administered 2020-07-02: 650 mg via INTRAVENOUS
  Filled 2020-07-02: qty 13

## 2020-07-02 MED ORDER — DEXTROSE 5 % IV SOLN: Freq: Once | INTRAVENOUS | Status: AC

## 2020-07-02 MED ORDER — LEUCOVORIN CALCIUM INJECTION 350 MG
400.0000 mg/m2 | Freq: Once | INTRAVENOUS | Status: AC
  Administered 2020-07-02: 644 mg via INTRAVENOUS
  Filled 2020-07-02: qty 32.2

## 2020-07-02 MED ORDER — SODIUM CHLORIDE 0.9% FLUSH: 10.0000 mL | INTRAVENOUS | Status: DC | PRN

## 2020-07-02 MED ORDER — SODIUM CHLORIDE 0.9 % IV SOLN: Freq: Once | INTRAVENOUS | Status: AC

## 2020-07-02 MED ORDER — PALONOSETRON HCL INJECTION 0.25 MG/5ML
INTRAVENOUS | Status: AC
  Filled 2020-07-02: qty 5

## 2020-07-02 MED ORDER — SODIUM CHLORIDE 0.9 % IV SOLN
2400.0000 mg/m2 | INTRAVENOUS | Status: DC
  Administered 2020-07-02: 3850 mg via INTRAVENOUS
  Filled 2020-07-02: qty 77

## 2020-07-02 MED ORDER — OXALIPLATIN CHEMO INJECTION 100 MG/20ML
85.0000 mg/m2 | Freq: Once | INTRAVENOUS | Status: AC
  Administered 2020-07-02: 135 mg via INTRAVENOUS
  Filled 2020-07-02: qty 20

## 2020-07-02 MED ORDER — SODIUM CHLORIDE 0.9 % IV SOLN
10.0000 mg | Freq: Once | INTRAVENOUS | Status: AC
  Administered 2020-07-02: 10 mg via INTRAVENOUS
  Filled 2020-07-02: qty 10

## 2020-07-02 MED ORDER — PALONOSETRON HCL INJECTION 0.25 MG/5ML
0.2500 mg | Freq: Once | INTRAVENOUS | Status: AC
  Administered 2020-07-02: 0.25 mg via INTRAVENOUS

## 2020-07-02 MED ORDER — SODIUM CHLORIDE 0.9 % IV SOLN
5.0000 mg/kg | Freq: Once | INTRAVENOUS | Status: AC
  Administered 2020-07-02: 300 mg via INTRAVENOUS
  Filled 2020-07-02: qty 12

## 2020-07-02 NOTE — Patient Instructions (Signed)
[facility] at [facility] °Discharge Instructions ° °You were seen today by Dr. Katragadda. He went over your recent results. You received your treatment today. Dr. Katragadda will see you back in 2 weeks for labs and follow up. ° ° °Thank you for choosing [facility] at [facility] to provide your oncology and hematology care.  To afford each patient quality time with our provider, please arrive at least 15 minutes before your scheduled appointment time.  ° °If you have a lab appointment with the Cancer Center please come in thru the Main Entrance and check in at the main information desk ° °You need to re-schedule your appointment should you arrive 10 or more minutes late.  We strive to give you quality time with our providers, and arriving late affects you and other patients whose appointments are after yours.  Also, if you no show three or more times for appointments you may be dismissed from the clinic at the providers discretion.     °Again, thank you for choosing [facility].  Our hope is that these requests will decrease the amount of time that you wait before being seen by our physicians.       °_____________________________________________________________ ° °Should you have questions after your visit to [facility], please contact our office at [phone] between the hours of 8:00 a.m. and 4:30 p.m.  Voicemails left after 4:00 p.m. will not be returned until the following business day.  For prescription refill requests, have your pharmacy contact our office and allow 72 hours.   ° °Cancer Center Support Programs:  ° °> Cancer Support Group  °2nd Tuesday of the month 1pm-2pm, Journey Room  ° ° °

## 2020-07-02 NOTE — Progress Notes (Signed)
[facility] CSW Progress Notes  Email from [facility] - they have filed patients application for Social Security disability benefits on 06/14/20.  Santa Genera, LCSW Clinical Social Worker Phone:  [phone]

## 2020-07-02 NOTE — Patient Instructions (Signed)
[facility] Discharge Instructions for Patients Receiving Chemotherapy  Today you received the following chemotherapy agents   To help prevent nausea and vomiting after your treatment, we encourage you to take your nausea medication   If you develop nausea and vomiting that is not controlled by your nausea medication, call the clinic.   BELOW ARE SYMPTOMS THAT SHOULD BE REPORTED IMMEDIATELY:  *FEVER GREATER THAN 100.5 F  *CHILLS WITH OR WITHOUT FEVER  NAUSEA AND VOMITING THAT IS NOT CONTROLLED WITH YOUR NAUSEA MEDICATION  *UNUSUAL SHORTNESS OF BREATH  *UNUSUAL BRUISING OR BLEEDING  TENDERNESS IN MOUTH AND THROAT WITH OR WITHOUT PRESENCE OF ULCERS  *URINARY PROBLEMS  *BOWEL PROBLEMS  UNUSUAL RASH Items with * indicate a potential emergency and should be followed up as soon as possible.  Feel free to call the clinic should you have any questions or concerns. The clinic phone number is [phone].  Please show the CHEMO ALERT CARD at check-in to the Emergency Department and triage nurse.   

## 2020-07-02 NOTE — Progress Notes (Signed)
Susan Martin, [place] [address]   CLINIC:  Medical Oncology/Hematology  PCP:  Patient, No Pcp Per None None   REASON FOR VISIT:  Follow-up for metastatic colon cancer to liver  PRIOR THERAPY: Sigmoid colectomy and end colostomy on 04/29/2020  NGS Results: Foundation 1 KRAS wildtype, MS--stable, TMB 4 Muts/Mb  CURRENT THERAPY: FOLFOX & bevacizumab every 2 weeks  BRIEF ONCOLOGIC HISTORY:  Oncology History  Metastatic colon cancer to liver ([place])  05/16/2020 Initial Diagnosis   Metastatic colon cancer to liver ([place])   06/03/2020 Genetic Testing   Foundation One:     06/04/2020 -  Chemotherapy   The patient had palonosetron (ALOXI) injection 0.25 mg, 0.25 mg, Intravenous,  Once, 2 of 4 cycles Administration: 0.25 mg (06/04/2020), 0.25 mg (06/18/2020) leucovorin 644 mg in dextrose 5 % 250 mL infusion, 400 mg/m2 = 644 mg, Intravenous,  Once, 2 of 4 cycles Administration: 644 mg (06/04/2020), 644 mg (06/18/2020) oxaliplatin (ELOXATIN) 135 mg in dextrose 5 % 500 mL chemo infusion, 85 mg/m2 = 135 mg, Intravenous,  Once, 2 of 4 cycles Administration: 135 mg (06/04/2020), 135 mg (06/18/2020) fluorouracil (ADRUCIL) chemo injection 650 mg, 400 mg/m2 = 650 mg, Intravenous,  Once, 2 of 4 cycles Administration: 650 mg (06/04/2020), 650 mg (06/18/2020) fluorouracil (ADRUCIL) 3,850 mg in sodium chloride 0.9 % 73 mL chemo infusion, 2,400 mg/m2 = 3,850 mg, Intravenous, 1 Day/Dose, 2 of 4 cycles Administration: 3,850 mg (06/04/2020), 3,850 mg (06/18/2020) bevacizumab-awwb (MVASI) 300 mg in sodium chloride 0.9 % 100 mL chemo infusion, 5 mg/kg = 300 mg (100 % of original dose 5 mg/kg), Intravenous,  Once, 2 of 4 cycles Dose modification: 5 mg/kg (original dose 5 mg/kg, Cycle 1) Administration: 300 mg (06/04/2020), 300 mg (06/18/2020) bevacizumab-bvzr (ZIRABEV) 300 mg in sodium chloride 0.9 % 100 mL chemo infusion, 5 mg/kg = 300 mg, Intravenous,  Once, 1 of 1 cycle   for chemotherapy treatment.      CANCER STAGING: Cancer Staging No matching staging information was found for the patient.  INTERVAL HISTORY:  [name], a 60 y.o. female, returns for routine follow-up and consideration for next cycle of chemotherapy. Susan Martin was last seen by Dr. Arletha Pili Iruku on 06/13/2020.  Due for cycle #3 of FOLFOX and bevacizumab today.   Overall, she tells me she has been feeling pretty well. She tolerated her previous treatment okay and reports experiencing cold sensitivity and drawing of lips and fingers when she went grocery shopping, but denies having numbness or tingling. The cold sensitivity fades after 2-3 days and denies having difficulty opening bottles. She denies having nosebleeds, gum bleeds, hematuria, hematochezia, or N/V/D. Her mouth has been sensitive, but denies having mouth sores. Her laparotomy incisions are healing well.  Overall, she feels ready for next cycle of chemo today.    REVIEW OF SYSTEMS:  Review of Systems  Constitutional: Positive for appetite change (75%) and fatigue (50%).  HENT:   Positive for mouth sores. Negative for nosebleeds.   Gastrointestinal: Negative for blood in stool, diarrhea, nausea and vomiting.  Genitourinary: Negative for hematuria.   Neurological: Positive for numbness (cold sensitivity).  All other systems reviewed and are negative.   PAST MEDICAL/SURGICAL HISTORY:  Past Medical History:  Diagnosis Date  . 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 Delton Coombes   04-29-2020 emergerency surgery for perforated colon s/p sigmoid colectomy w/ colostomy; dx  Stage IV colon cancer mets to liver  . Anxiety   .  Depression   . Hypertension    followed by dr Glo Herring and oncology  (05-27-2020 per pt does not have pcp yet)  . IDA (iron deficiency anemia)   . Pulmonary nodule, right    Past Surgical History:  Procedure Laterality Date  . ABDOMINAL HYSTERECTOMY  03-31-2016   _0     W/  BILATERAL SALPINOOPHORECTOMY   . APPLICATION OF WOUND VAC N/A 04/29/2020   Procedure: APPLICATION OF WOUND VAC;  Surgeon: Kinsinger, Arta Bruce, MD;  Location: [place];  Service: General;  Laterality: N/A;  . ENDOMETRIAL ABLATION    . LAPAROTOMY N/A 04/29/2020   Procedure: EXPLORATORY LAPAROTOMY;  Surgeon: Kieth Brightly Arta Bruce, MD;  Location: [place];  Service: General;  Laterality: N/A;  . PARTIAL COLECTOMY N/A 04/29/2020   Procedure: PARTIAL COLECTOMY WITH END COLOSTOMY;  Surgeon: Kieth Brightly Arta Bruce, MD;  Location: [place];  Service: General;  Laterality: N/A;  . PORTACATH PLACEMENT Right 05/28/2020   Procedure: INSERTION PORT-A-CATH;  Surgeon: Mickeal Skinner, MD;  Location: [facility];  Service: General;  Laterality: Right;  . SALPINGOOPHORECTOMY Left 03/31/2016   Procedure: LEFT SALPINGO OOPHORECTOMY WITH FROZEN SECTION,  ABDOMINAL HYSTERECTOMY WITH BILATERAL SALPINGO-OOPHORECTOMY;  Surgeon: Jonnie Kind, MD;  Location: [facility];  Service: Gynecology;  Laterality: Left;    SOCIAL HISTORY:  Social History   Socioeconomic History  . Marital status: Divorced    Spouse name: Not on file  . Number of children: Not on file  . Years of education: Not on file  . Highest education level: Not on file  Occupational History  . Not on file  Tobacco Use  . Smoking status: Former Smoker    Packs/day: 0.50    Years: 5.00    Pack years: 2.50    Types: Cigarettes    Quit date: 03/28/2015    Years since quitting: 5.2  . Smokeless tobacco: Never Used  Vaping Use  . Vaping Use: Never used  Substance and Sexual Activity  . Alcohol use: No  . Drug use: Never  . Sexual activity: Yes    Partners: Male    Birth control/protection: Surgical  Other Topics Concern  . Not on file  Social History Narrative  . Not on file   Social Determinants of Health   Financial Resource Strain: Low Risk   . Difficulty of Paying Living Expenses: Not very hard  Food Insecurity: No Food  Insecurity  . Worried About Charity fundraiser in the Last Year: Never true  . Ran Out of Food in the Last Year: Never true  Transportation Needs: No Transportation Needs  . Lack of Transportation (Medical): No  . Lack of Transportation (Non-Medical): No  Physical Activity: Insufficiently Active  . Days of Exercise per Week: 2 days  . Minutes of Exercise per Session: 30 min  Stress: Stress Concern Present  . Feeling of Stress : To some extent  Social Connections: Moderately Integrated  . Frequency of Communication with Friends and Family: More than three times a week  . Frequency of Social Gatherings with Friends and Family: Once a week  . Attends Religious Services: More than 4 times per year  . Active Member of Clubs or Organizations: No  . Attends Archivist Meetings: Never  . Marital Status: Living with partner  Intimate Partner Violence: Not At Risk  . Fear of Current or Ex-Partner: No  . Emotionally Abused: No  . Physically Abused: No  . Sexually Abused: No    FAMILY  HISTORY:  Family History  Problem Relation Age of Onset  . Hypertension Mother   . Diabetes Mother   . Cirrhosis Father     CURRENT MEDICATIONS:  Current Outpatient Medications  Medication Sig Dispense Refill  . BEVACIZUMAB IV Inject 5 mg/kg into the vein every 14 (fourteen) days.    . fluorouracil CALGB 27782 in sodium chloride 0.9 % 150 mL Inject 2,400 mg/m2 into the vein over 48 hr.    . fluticasone (FLONASE) 50 MCG/ACT nasal spray Place 1-2 sprays into both nostrils daily as needed for allergies or rhinitis.    . hydrOXYzine (VISTARIL) 25 MG capsule Take 1 capsule (25 mg total) by mouth 3 (three) times daily as needed. Prn anxiety 60 capsule 2  . LEUCOVORIN CALCIUM IV Inject 400 mg/m2 into the vein every 21 ( twenty-one) days.    . OXALIPLATIN IV Inject 85 mg/m2 into the vein every 14 (fourteen) days.    Marland Kitchen PARoxetine (PAXIL) 40 MG tablet Take 1 tablet (40 mg total) by mouth every morning. 90  tablet 3  . polyethylene glycol (MIRALAX / GLYCOLAX) packet Take 17 g by mouth every other day.    . prochlorperazine (COMPAZINE) 10 MG tablet Take 1 tablet (10 mg total) by mouth every 6 (six) hours as needed (Nausea or vomiting). 30 tablet 1  . triamterene-hydrochlorothiazide (DYAZIDE) 37.5-25 MG capsule Take 1 each (1 capsule total) by mouth daily. (Patient taking differently: Take 1 capsule by mouth daily.) 30 capsule 11  . HYDROcodone-acetaminophen (NORCO/VICODIN) 5-325 MG tablet Take 1 tablet by mouth every 6 (six) hours as needed for moderate pain. (Patient not taking: Reported on 07/02/2020) 8 tablet 0  . lidocaine-prilocaine (EMLA) cream Apply small amount to port a cath site and cover with plastic wrap 1 hour prior to chemotherapy appointments (Patient not taking: Reported on 07/02/2020) 30 g 3   No current facility-administered medications for this visit.    ALLERGIES:  No Known Allergies  PHYSICAL EXAM:  Performance status (ECOG): 1 - Symptomatic but completely ambulatory  Vitals:   07/02/20 0924  BP: 131/61  Pulse: 74  Resp: 18  Temp: (!) 96.8 F (36 C)  SpO2: 100%   Wt Readings from Last 3 Encounters:  07/02/20 135 lb 8 oz (61.5 kg)  06/18/20 134 lb 9.6 oz (61.1 kg)  06/13/20 129 lb (58.5 kg)   Physical Exam Vitals reviewed.  Constitutional:      Appearance: Normal appearance.  Cardiovascular:     Rate and Rhythm: Normal rate and regular rhythm.     Pulses: Normal pulses.     Heart sounds: Normal heart sounds.  Pulmonary:     Effort: Pulmonary effort is normal.     Breath sounds: Normal breath sounds.  Chest:     Comments: Port-a-Cath in R chest Abdominal:     Palpations: Abdomen is soft.     Tenderness: There is no abdominal tenderness.     Comments: Colostomy bag  Musculoskeletal:     Right lower leg: No edema.     Left lower leg: No edema.  Neurological:     General: No focal deficit present.     Mental Status: She is alert and oriented to person,  place, and time.  Psychiatric:        Mood and Affect: Mood normal.        Behavior: Behavior normal.     LABORATORY DATA:  I have reviewed the labs as listed.  CBC Latest Ref Rng & Units 07/02/2020  06/18/2020 06/13/2020  WBC 4.0 - 10.5 K/uL 5.9 5.9 6.4  Hemoglobin 12.0 - 15.0 g/dL 12.0 11.7(L) 13.4  Hematocrit 36.0 - 46.0 % 37.0 35.9(L) 41.7  Platelets 150 - 400 K/uL 239 209 264   CMP Latest Ref Rng & Units 07/02/2020 06/18/2020 06/13/2020  Glucose 70 - 99 mg/dL 140(H) 88 120(H)  BUN 6 - 20 mg/dL _0 Creatinine 0.44 - 1.00 mg/dL 0.73 0.62 0.77  Sodium 135 - 145 mmol/L 130(L) 135 135  Potassium 3.5 - 5.1 mmol/L 3.7 4.1 3.8  Chloride 98 - 111 mmol/L 93(L) 98 97(L)  CO2 22 - 32 mmol/L _1 Calcium 8.9 - 10.3 mg/dL 8.7(L) 8.8(L) 9.5  Total Protein 6.5 - 8.1 g/dL 7.4 7.2 8.1  Total Bilirubin 0.3 - 1.2 mg/dL 0.4 0.5 0.3  Alkaline Phos 38 - 126 U/L 171(H) 190(H) 270(H)  AST 15 - 41 U/L 19 31 36  ALT 0 - 44 U/L _2 Lab Results  Component Value Date   CEA1 24.8 (H) 06/04/2020   CEA1 60.2 (H) 05/02/2020    DIAGNOSTIC IMAGING:  I have independently reviewed the scans and discussed with the patient. No results found.   ASSESSMENT:  1.  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 -CT chest on 05/02/2020 with no evidence of pulmonary metastatic disease. -CTAP on 05/07/2020 with multiple lesions throughout the liver, largest in the right lobe measuring 3.9 cm, lateral segment left lobe measuring 2.6 cm and inferior right lobe confluent lesion 4.2 x 3.7 cm.  No lymphadenopathy. -CEA on 05/02/2020-60.2. -FOLFOX and bevacizumab started on 06/04/2020. -Foundation 1 testing shows MS-stable, KRAS/NRAS wild-type  2.  Iron deficiency anemia: -Feraheme on  04/30/2020 and 05/06/2020.  3.  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 -Smoked for 3 to 4 years and quit. -Mother had cancer, type unknown.  Maternal uncle had lung cancer.  Maternal aunt had lung cancer.   PLAN:  1.  Metastatic colon adenocarcinoma to liver, MS-stable: -She has tolerated last cycle of chemo very well. -She reported that her fingers and lips have drawn to one side for 1 day when she was out at [facility].  Does not report any constant numbness or feeling cold sensation. -She has some cold sensitivity for the first 3 to 4 days. -Reviewed labs today which showed normal LFTs.  Alk phos is improving. -Proceed with cycle 3 without any dose modifications.  RTC 2 weeks for follow-up.  Check CEA level.  2.  Iron deficiency anemia: -Last Feraheme on 05/06/2020. -Hemoglobin improved to 12.0.  3.  Abdominal pain: -Continue oxycodone every 6 hours as needed.   Orders placed this encounter:  No orders of the defined types were placed in this encounter.    Derek Jack, MD [place] [phone]   I, Milinda Antis, am acting as a scribe for Dr. Sanda Linger.  I, Derek Jack MD, have reviewed the above documentation for accuracy and completeness, and I agree with the above.

## 2020-07-02 NOTE — Progress Notes (Signed)
Patient is good for treatment today per Dr. Ellin Saba.

## 2020-07-02 NOTE — Progress Notes (Signed)
Patients port flushed without difficulty.  Good blood return noted with no bruising or swelling noted at site.  Transparent dressing applied.  Patient left accessed for chemotherapy treatment. 

## 2020-07-02 NOTE — Progress Notes (Signed)
Pt here for D1C3 FOLFOX and avastin. Pt is complaining of increased tingling in hands and feet.  This cycle she states at some point her right hand was drawing up.  Labs WNL for treatment.  Verbal order from Dr Kirtland Bouchard okay for treatment.   Tolerated treatment well today without incidence.  Discharge ambulatory with ambulatory 33fu pump in stable condition.  Vital signs stable prior to discharge.

## 2020-07-03 LAB — CEA: CEA: 13.8 ng/mL — ABNORMAL HIGH (ref 0.0–4.7)

## 2020-07-04 ENCOUNTER — Inpatient Hospital Stay (HOSPITAL_COMMUNITY): Payer: Medicaid Other

## 2020-07-04 ENCOUNTER — Other Ambulatory Visit: Payer: Self-pay

## 2020-07-04 VITALS — BP 131/75 | HR 72 | Temp 97.0°F | Resp 18

## 2020-07-04 DIAGNOSIS — C189 Malignant neoplasm of colon, unspecified: Secondary | ICD-10-CM

## 2020-07-04 DIAGNOSIS — C787 Secondary malignant neoplasm of liver and intrahepatic bile duct: Secondary | ICD-10-CM

## 2020-07-04 MED ORDER — HEPARIN SOD (PORK) LOCK FLUSH 100 UNIT/ML IV SOLN: 500.0000 [IU] | Freq: Once | INTRAVENOUS | Status: DC

## 2020-07-04 MED ORDER — SODIUM CHLORIDE 0.9% FLUSH: 10.0000 mL | Freq: Once | INTRAVENOUS | Status: DC

## 2020-07-04 NOTE — Progress Notes (Signed)
Patients port flushed without difficulty.  Good blood return noted with no bruising or swelling noted at site.  Band aid applied.  VSS with discharge and left in satisfactory condition with no s/s of distress noted.   

## 2020-07-05 ENCOUNTER — Other Ambulatory Visit (HOSPITAL_COMMUNITY): Payer: Self-pay | Admitting: Hematology

## 2020-07-16 ENCOUNTER — Other Ambulatory Visit (HOSPITAL_COMMUNITY): Payer: Self-pay

## 2020-07-16 ENCOUNTER — Ambulatory Visit (HOSPITAL_COMMUNITY): Payer: Self-pay | Admitting: Hematology

## 2020-07-16 ENCOUNTER — Ambulatory Visit (HOSPITAL_COMMUNITY): Payer: Self-pay

## 2020-07-17 ENCOUNTER — Other Ambulatory Visit: Payer: Self-pay

## 2020-07-17 ENCOUNTER — Inpatient Hospital Stay (HOSPITAL_BASED_OUTPATIENT_CLINIC_OR_DEPARTMENT_OTHER): Payer: Medicaid Other | Admitting: Hematology

## 2020-07-17 ENCOUNTER — Inpatient Hospital Stay (HOSPITAL_COMMUNITY): Payer: Medicaid Other

## 2020-07-17 VITALS — BP 152/82 | HR 84 | Temp 97.2°F | Resp 18 | Wt 134.8 lb

## 2020-07-17 VITALS — BP 147/78 | HR 80 | Temp 97.0°F | Resp 18

## 2020-07-17 DIAGNOSIS — C787 Secondary malignant neoplasm of liver and intrahepatic bile duct: Secondary | ICD-10-CM

## 2020-07-17 DIAGNOSIS — Z5111 Encounter for antineoplastic chemotherapy: Secondary | ICD-10-CM | POA: Diagnosis not present

## 2020-07-17 DIAGNOSIS — C189 Malignant neoplasm of colon, unspecified: Secondary | ICD-10-CM

## 2020-07-17 LAB — CBC WITH DIFFERENTIAL/PLATELET
Abs Immature Granulocytes: 0.02 10*3/uL (ref 0.00–0.07)
Basophils Absolute: 0 10*3/uL (ref 0.0–0.1)
Basophils Relative: 1 %
Eosinophils Absolute: 0.1 10*3/uL (ref 0.0–0.5)
Eosinophils Relative: 2 %
HCT: 37.8 % (ref 36.0–46.0)
Hemoglobin: 12.5 g/dL (ref 12.0–15.0)
Immature Granulocytes: 0 %
Lymphocytes Relative: 48 %
Lymphs Abs: 2.2 10*3/uL (ref 0.7–4.0)
MCH: 28.7 pg (ref 26.0–34.0)
MCHC: 33.1 g/dL (ref 30.0–36.0)
MCV: 86.7 fL (ref 80.0–100.0)
Monocytes Absolute: 0.8 10*3/uL (ref 0.1–1.0)
Monocytes Relative: 16 %
Neutro Abs: 1.5 10*3/uL — ABNORMAL LOW (ref 1.7–7.7)
Neutrophils Relative %: 33 %
Platelets: 188 10*3/uL (ref 150–400)
RBC: 4.36 MIL/uL (ref 3.87–5.11)
RDW: 15.9 % — ABNORMAL HIGH (ref 11.5–15.5)
WBC: 4.6 10*3/uL (ref 4.0–10.5)
nRBC: 0 % (ref 0.0–0.2)

## 2020-07-17 LAB — COMPREHENSIVE METABOLIC PANEL
ALT: 15 U/L (ref 0–44)
AST: 22 U/L (ref 15–41)
Albumin: 3.1 g/dL — ABNORMAL LOW (ref 3.5–5.0)
Alkaline Phosphatase: 132 U/L — ABNORMAL HIGH (ref 38–126)
Anion gap: 9 (ref 5–15)
BUN: 10 mg/dL (ref 6–20)
CO2: 28 mmol/L (ref 22–32)
Calcium: 8.7 mg/dL — ABNORMAL LOW (ref 8.9–10.3)
Chloride: 95 mmol/L — ABNORMAL LOW (ref 98–111)
Creatinine, Ser: 0.83 mg/dL (ref 0.44–1.00)
GFR, Estimated: >60 mL/min (ref >60)
Glucose, Bld: 140 mg/dL — ABNORMAL HIGH (ref 70–99)
Potassium: 3.2 mmol/L — ABNORMAL LOW (ref 3.5–5.1)
Sodium: 132 mmol/L — ABNORMAL LOW (ref 135–145)
Total Bilirubin: 0.5 mg/dL (ref 0.3–1.2)
Total Protein: 7.4 g/dL (ref 6.5–8.1)

## 2020-07-17 LAB — MAGNESIUM: Magnesium: 1.7 mg/dL (ref 1.7–2.4)

## 2020-07-17 MED ORDER — DEXTROSE 5 % IV SOLN: Freq: Once | INTRAVENOUS | Status: AC

## 2020-07-17 MED ORDER — DEXTROSE 5 % IV SOLN
68.0000 mg/m2 | Freq: Once | INTRAVENOUS | Status: AC
  Administered 2020-07-17: 110 mg via INTRAVENOUS
  Filled 2020-07-17: qty 20

## 2020-07-17 MED ORDER — LEUCOVORIN CALCIUM INJECTION 350 MG
400.0000 mg/m2 | Freq: Once | INTRAVENOUS | Status: AC
  Administered 2020-07-17: 644 mg via INTRAVENOUS
  Filled 2020-07-17: qty 32.2

## 2020-07-17 MED ORDER — SODIUM CHLORIDE 0.9 % IV SOLN: Freq: Once | INTRAVENOUS | Status: AC

## 2020-07-17 MED ORDER — SODIUM CHLORIDE 0.9 % IV SOLN
5.0000 mg/kg | Freq: Once | INTRAVENOUS | Status: AC
  Administered 2020-07-17: 300 mg via INTRAVENOUS
  Filled 2020-07-17: qty 12

## 2020-07-17 MED ORDER — FLUOROURACIL CHEMO INJECTION 2.5 GM/50ML
400.0000 mg/m2 | Freq: Once | INTRAVENOUS | Status: AC
Start: 2020-07-17 — End: 2020-07-17
  Administered 2020-07-17: 650 mg via INTRAVENOUS
  Filled 2020-07-17: qty 13

## 2020-07-17 MED ORDER — SODIUM CHLORIDE 0.9 % IV SOLN
10.0000 mg | Freq: Once | INTRAVENOUS | Status: AC
  Administered 2020-07-17: 10 mg via INTRAVENOUS
  Filled 2020-07-17: qty 10

## 2020-07-17 MED ORDER — PALONOSETRON HCL INJECTION 0.25 MG/5ML
0.2500 mg | Freq: Once | INTRAVENOUS | Status: AC
  Administered 2020-07-17: 0.25 mg via INTRAVENOUS

## 2020-07-17 MED ORDER — POTASSIUM CHLORIDE CRYS ER 20 MEQ PO TBCR
EXTENDED_RELEASE_TABLET | ORAL | Status: AC
  Filled 2020-07-17: qty 1

## 2020-07-17 MED ORDER — POTASSIUM CHLORIDE CRYS ER 20 MEQ PO TBCR
20.0000 meq | EXTENDED_RELEASE_TABLET | Freq: Once | ORAL | Status: AC
  Administered 2020-07-17: 20 meq via ORAL

## 2020-07-17 MED ORDER — PALONOSETRON HCL INJECTION 0.25 MG/5ML
INTRAVENOUS | Status: AC
  Filled 2020-07-17: qty 5

## 2020-07-17 MED ORDER — HEPARIN SOD (PORK) LOCK FLUSH 100 UNIT/ML IV SOLN: 500.0000 [IU] | Freq: Once | INTRAVENOUS | Status: DC | PRN

## 2020-07-17 MED ORDER — FLUOROURACIL CHEMO INJECTION 5 GM/100ML
2400.0000 mg/m2 | INTRAVENOUS | Status: DC
  Administered 2020-07-17: 3850 mg via INTRAVENOUS
  Filled 2020-07-17: qty 77

## 2020-07-17 MED ORDER — SODIUM CHLORIDE 0.9% FLUSH: 10.0000 mL | INTRAVENOUS | Status: DC | PRN

## 2020-07-17 NOTE — Patient Instructions (Signed)
[facility] Discharge Instructions for Patients Receiving Chemotherapy  Today you received the following chemotherapy agents   To help prevent nausea and vomiting after your treatment, we encourage you to take your nausea medication   If you develop nausea and vomiting that is not controlled by your nausea medication, call the clinic.   BELOW ARE SYMPTOMS THAT SHOULD BE REPORTED IMMEDIATELY:  *FEVER GREATER THAN 100.5 F  *CHILLS WITH OR WITHOUT FEVER  NAUSEA AND VOMITING THAT IS NOT CONTROLLED WITH YOUR NAUSEA MEDICATION  *UNUSUAL SHORTNESS OF BREATH  *UNUSUAL BRUISING OR BLEEDING  TENDERNESS IN MOUTH AND THROAT WITH OR WITHOUT PRESENCE OF ULCERS  *URINARY PROBLEMS  *BOWEL PROBLEMS  UNUSUAL RASH Items with * indicate a potential emergency and should be followed up as soon as possible.  Feel free to call the clinic should you have any questions or concerns. The clinic phone number is [phone].  Please show the CHEMO ALERT CARD at check-in to the Emergency Department and triage nurse.   

## 2020-07-17 NOTE — Patient Instructions (Signed)
[place] at [facility] Discharge Instructions  You were seen today by Dr. Delton Coombes. He went over your recent results. You received your treatment today and a white blood cell booster will be added to your plan, given as an injection on the day that your pump is removed. Purchase Claritin over the counter and start taking 1 tablet daily for 5 days, starting today. Dr. Delton Coombes will see you back in 2 weeks for labs and follow up.   Thank you for choosing [place] at [facility] to provide your oncology and hematology care.  To afford each patient quality time with our provider, please arrive at least 15 minutes before your scheduled appointment time.   If you have a lab appointment with the [facility] please come in thru the Main Entrance and check in at the main information desk  You need to re-schedule your appointment should you arrive 10 or more minutes late.  We strive to give you quality time with our providers, and arriving late affects you and other patients whose appointments are after yours.  Also, if you no show three or more times for appointments you may be dismissed from the clinic at the providers discretion.     Again, thank you for choosing [facility].  Our hope is that these requests will decrease the amount of time that you wait before being seen by our physicians.       _____________________________________________________________  Should you have questions after your visit to [facility], please contact our office at [phone] between the hours of 8:00 a.m. and 4:30 p.m.  Voicemails left after 4:00 p.m. will not be returned until the following business day.  For prescription refill requests, have your pharmacy contact our office and allow 72 hours.    Cancer Center Support Programs:   > Cancer Support Group  2nd Tuesday of the month 1pm-2pm, Journey Room

## 2020-07-17 NOTE — Patient Instructions (Signed)
[facility] at [facility] Discharge Instructions  Labs drawn from portacath today   Thank you for choosing [facility] at [facility] to provide your oncology and hematology care.  To afford each patient quality time with our provider, please arrive at least 15 minutes before your scheduled appointment time.   If you have a lab appointment with the Cancer Center please come in thru the Main Entrance and check in at the main information desk.  You need to re-schedule your appointment should you arrive 10 or more minutes late.  We strive to give you quality time with our providers, and arriving late affects you and other patients whose appointments are after yours.  Also, if you no show three or more times for appointments you may be dismissed from the clinic at the providers discretion.     Again, thank you for choosing [facility].  Our hope is that these requests will decrease the amount of time that you wait before being seen by our physicians.       _____________________________________________________________  Should you have questions after your visit to [facility], please contact our office at [phone] and follow the prompts.  Our office hours are 8:00 a.m. and 4:30 p.m. Monday - Friday.  Please note that voicemails left after 4:00 p.m. may not be returned until the following business day.  We are closed weekends and major holidays.  You do have access to a nurse 24-7, just call the main number to the clinic [phone] and do not press any options, hold on the line and a nurse will answer the phone.    For prescription refill requests, have your pharmacy contact our office and allow 72 hours.    Due to Covid, you will need to wear a mask upon entering the hospital. If you do not have a mask, a mask will be given to you at the Main Entrance upon arrival. For doctor visits, patients may have 1 support person age 18  or older with them. For treatment visits, patients can not have anyone with them due to social distancing guidelines and our immunocompromised population.     

## 2020-07-17 NOTE — Progress Notes (Signed)
[place], [place] [address]   CLINIC:  Medical Oncology/Hematology  PCP:  Patient, No Pcp Per None None   REASON FOR VISIT:  Follow-up for metastatic colon cancer to liver  PRIOR THERAPY: Sigmoid colectomy and end colostomy on 04/29/2020  NGS Results: Foundation 1 KRAS wildtype, MS--stable, TMB 4 Muts/Mb  CURRENT THERAPY: FOLFOX & bevacizumab every 2 weeks  BRIEF ONCOLOGIC HISTORY:  Oncology History  Metastatic colon cancer to liver ([place])  05/16/2020 Initial Diagnosis   Metastatic colon cancer to liver ([place])   06/03/2020 Genetic Testing   Foundation One:     06/04/2020 -  Chemotherapy    Patient is on Treatment Plan: COLORECTAL FOLFOX + BEVACIZUMAB Q14D        CANCER STAGING: Cancer Staging No matching staging information was found for the patient.  INTERVAL HISTORY:  [name], a 60 y.o. female, returns for routine follow-up and consideration for next cycle of chemotherapy. Susan Martin was last seen on 07/02/2020.  Due for cycle #4 of FOLFOX and bevacizumab today.   Overall, Susan Martin tells me Susan Martin has been feeling okay. Susan Martin reports that Susan Martin developed numbness and tingling in her legs when the pump was placed and mostly resolved after the pump was taken off; Susan Martin also has some tingling in her hands especially when touching cold. Otherwise Susan Martin denies numbness and tingling currently. Susan Martin reports having diarrhea for 2-3 days after her last treatment, though Susan Martin was constipated prior to that. Susan Martin denies having nosebleeds, hematuria or hematochezia. Her appetite is excellent and Susan Martin denies leg swelling. Susan Martin reports having bruising on the skin of her forearms which Susan Martin attributes to noseeums which Susan Martin has had prior to starting chemo.  Overall, Susan Martin feels ready for next cycle of chemo today.    REVIEW OF SYSTEMS:  Review of Systems  Constitutional: Positive for fatigue (50%). Negative for appetite change.  HENT:   Negative for  nosebleeds.   Cardiovascular: Negative for leg swelling.  Gastrointestinal: Positive for constipation and diarrhea (lasting 2-3 days after Tx). Negative for blood in stool.  Genitourinary: Negative for hematuria.   Musculoskeletal: Positive for myalgias (3/10 leg pain).  Neurological: Positive for numbness (& tingling in legs; cold sensitivity in hands).  All other systems reviewed and are negative.   PAST MEDICAL/SURGICAL HISTORY:  Past Medical History:  Diagnosis Date  . 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 Delton Coombes   04-29-2020 [facility] surgery for perforated colon s/p sigmoid colectomy w/ colostomy; dx  Stage IV colon cancer mets to liver  . Anxiety   . Depression   . Hypertension    followed by dr Glo Herring and oncology  (05-27-2020 per pt does not have pcp yet)  . IDA (iron deficiency anemia)   . Pulmonary nodule, right    Past Surgical History:  Procedure Laterality Date  . ABDOMINAL HYSTERECTOMY  03-31-2016   _0    W/  BILATERAL SALPINOOPHORECTOMY   . APPLICATION OF WOUND VAC N/A 04/29/2020   Procedure: APPLICATION OF WOUND VAC;  Surgeon: Kinsinger, Arta Bruce, MD;  Location: [place];  Service: General;  Laterality: N/A;  . ENDOMETRIAL ABLATION    . LAPAROTOMY N/A 04/29/2020   Procedure: EXPLORATORY LAPAROTOMY;  Surgeon: Kieth Brightly Arta Bruce, MD;  Location: [place];  Service: General;  Laterality: N/A;  . PARTIAL COLECTOMY N/A 04/29/2020   Procedure: PARTIAL COLECTOMY WITH END COLOSTOMY;  Surgeon: Kieth Brightly Arta Bruce, MD;  Location: [place];  Service: General;  Laterality:  N/A;  . PORTACATH PLACEMENT Right 05/28/2020   Procedure: INSERTION PORT-A-CATH;  Surgeon: Kieth Brightly Arta Bruce, MD;  Location: [facility];  Service: General;  Laterality: Right;  . SALPINGOOPHORECTOMY Left 03/31/2016   Procedure: LEFT SALPINGO OOPHORECTOMY WITH FROZEN SECTION,  ABDOMINAL HYSTERECTOMY WITH BILATERAL SALPINGO-OOPHORECTOMY;  Surgeon: Jonnie Kind, MD;  Location: [facility];  Service: Gynecology;  Laterality: Left;    SOCIAL HISTORY:  Social History   Socioeconomic History  . Marital status: Divorced    Spouse name: Not on file  . Number of children: Not on file  . Years of education: Not on file  . Highest education level: Not on file  Occupational History  . Not on file  Tobacco Use  . Smoking status: Former Smoker    Packs/day: 0.50    Years: 5.00    Pack years: 2.50    Types: Cigarettes    Quit date: 03/28/2015    Years since quitting: 5.3  . Smokeless tobacco: Never Used  Vaping Use  . Vaping Use: Never used  Substance and Sexual Activity  . Alcohol use: No  . Drug use: Never  . Sexual activity: Yes    Partners: Male    Birth control/protection: Surgical  Other Topics Concern  . Not on file  Social History Narrative  . Not on file   Social Determinants of Health   Financial Resource Strain: Low Risk   . Difficulty of Paying Living Expenses: Not very hard  Food Insecurity: No Food Insecurity  . Worried About Charity fundraiser in the Last Year: Never true  . Ran Out of Food in the Last Year: Never true  Transportation Needs: No Transportation Needs  . Lack of Transportation (Medical): No  . Lack of Transportation (Non-Medical): No  Physical Activity: Insufficiently Active  . Days of Exercise per Week: 2 days  . Minutes of Exercise per Session: 30 min  Stress: Stress Concern Present  . Feeling of Stress : To some extent  Social Connections: Moderately Integrated  . Frequency of Communication with Friends and Family: More than three times a week  . Frequency of Social Gatherings with Friends and Family: Once a week  . Attends Religious Services: More than 4 times per year  . Active Member of Clubs or Organizations: No  . Attends Archivist Meetings: Never  . Marital Status: Living with partner  Intimate Partner Violence: Not At Risk  . Fear of Current or Ex-Partner: No  . Emotionally  Abused: No  . Physically Abused: No  . Sexually Abused: No    FAMILY HISTORY:  Family History  Problem Relation Age of Onset  . Hypertension Mother   . Diabetes Mother   . Cirrhosis Father     CURRENT MEDICATIONS:  Current Outpatient Medications  Medication Sig Dispense Refill  . BEVACIZUMAB IV Inject 5 mg/kg into the vein every 14 (fourteen) days.    . fluorouracil CALGB 13086 in sodium chloride 0.9 % 150 mL Inject 2,400 mg/m2 into the vein over 48 hr.    . hydrOXYzine (VISTARIL) 25 MG capsule Take 1 capsule (25 mg total) by mouth 3 (three) times daily as needed. Prn anxiety 60 capsule 2  . LEUCOVORIN CALCIUM IV Inject 400 mg/m2 into the vein every 21 ( twenty-one) days.    Marland Kitchen lidocaine-prilocaine (EMLA) cream Apply small amount to port a cath site and cover with plastic wrap 1 hour prior to chemotherapy appointments 30 g 3  . OXALIPLATIN IV  Inject 85 mg/m2 into the vein every 14 (fourteen) days.    Marland Kitchen oxyCODONE (OXY IR/ROXICODONE) 5 MG immediate release tablet TAKE 1 TABLET BY MOUTH EVERY 6 HOURS AS NEEDED FOR MODERATE PAIN. 120 tablet 0  . PARoxetine (PAXIL) 40 MG tablet Take 1 tablet (40 mg total) by mouth every morning. 90 tablet 3  . polyethylene glycol (MIRALAX / GLYCOLAX) packet Take 17 g by mouth every other day.    . prochlorperazine (COMPAZINE) 10 MG tablet Take 1 tablet (10 mg total) by mouth every 6 (six) hours as needed (Nausea or vomiting). 30 tablet 1  . triamterene-hydrochlorothiazide (DYAZIDE) 37.5-25 MG capsule Take 1 each (1 capsule total) by mouth daily. (Patient taking differently: Take 1 capsule by mouth daily.) 30 capsule 11   No current facility-administered medications for this visit.    ALLERGIES:  No Known Allergies  PHYSICAL EXAM:  Performance status (ECOG): 1 - Symptomatic but completely ambulatory  Vitals:   07/17/20 0756  BP: (!) 152/82  Pulse: 84  Resp: 18  Temp: (!) 97.2 F (36.2 C)  SpO2: 95%   Wt Readings from Last 3 Encounters:   07/17/20 134 lb 12.8 oz (61.1 kg)  07/02/20 135 lb 8 oz (61.5 kg)  06/18/20 134 lb 9.6 oz (61.1 kg)   Physical Exam Vitals reviewed.  Constitutional:      Appearance: Normal appearance.  Cardiovascular:     Rate and Rhythm: Normal rate and regular rhythm.     Pulses: Normal pulses.     Heart sounds: Normal heart sounds.  Pulmonary:     Effort: Pulmonary effort is normal.     Breath sounds: Normal breath sounds.  Chest:     Comments: Port-a-Cath in R chest Abdominal:     Comments: Colostomy bag  Musculoskeletal:     Right lower leg: No edema.     Left lower leg: No edema.  Neurological:     General: No focal deficit present.     Mental Status: Susan Martin is alert and oriented to person, place, and time.  Psychiatric:        Mood and Affect: Mood normal.        Behavior: Behavior normal.     LABORATORY DATA:  I have reviewed the labs as listed.  CBC Latest Ref Rng & Units 07/17/2020 07/02/2020 06/18/2020  WBC 4.0 - 10.5 K/uL 4.6 5.9 5.9  Hemoglobin 12.0 - 15.0 g/dL 12.5 12.0 11.7(L)  Hematocrit 36.0 - 46.0 % 37.8 37.0 35.9(L)  Platelets 150 - 400 K/uL 188 239 209   CMP Latest Ref Rng & Units 07/17/2020 07/02/2020 06/18/2020  Glucose 70 - 99 mg/dL 140(H) 140(H) 88  BUN 6 - 20 mg/dL _0 Creatinine 0.44 - 1.00 mg/dL 0.83 0.73 0.62  Sodium 135 - 145 mmol/L 132(L) 130(L) 135  Potassium 3.5 - 5.1 mmol/L 3.2(L) 3.7 4.1  Chloride 98 - 111 mmol/L 95(L) 93(L) 98  CO2 22 - 32 mmol/L _1 Calcium 8.9 - 10.3 mg/dL 8.7(L) 8.7(L) 8.8(L)  Total Protein 6.5 - 8.1 g/dL 7.4 7.4 7.2  Total Bilirubin 0.3 - 1.2 mg/dL 0.5 0.4 0.5  Alkaline Phos 38 - 126 U/L 132(H) 171(H) 190(H)  AST 15 - 41 U/L _2 ALT 0 - 44 U/L _3 Lab Results  Component Value Date   CEA1 13.8 (H) 07/02/2020   CEA1 24.8 (H) 06/04/2020   CEA1 60.2 (H) 05/02/2020    DIAGNOSTIC IMAGING:  I  have independently reviewed the scans and discussed with the patient. No results found.   ASSESSMENT:  1.  Metastatic colon adenocarcinoma to liver: -Sigmoid colectomy and end colostomy on 04/29/2020 -Pathology pT4a,N1C (1 tumor deposit), 0/8 lymph nodes involved -MMR proficient, MSI-stable -Liver biopsy on May 03, 2020-adenocarcinoma consistent with colon primary. -CT chest on 05/02/2020 with no evidence of pulmonary metastatic disease. -CTAP on 05/07/2020 with multiple lesions throughout the liver, largest in the right lobe measuring 3.9 cm, lateral segment left lobe measuring 2.6 cm and inferior right lobe confluent lesion 4.2 x 3.7 cm. No lymphadenopathy. -CEA on 05/02/2020-60.2. -FOLFOX and bevacizumab started on 06/04/2020. -Foundation 1 testing shows MS-stable, KRAS/NRAS wild-type  2. Iron deficiency anemia: -Feraheme on 04/30/2020 and 05/06/2020.  3. 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 -Smoked for 3 to 4 years and quit. -Mother had cancer, type unknown. Maternal uncle had lung cancer. Maternal aunt had lung cancer.   PLAN:  1. Metastatic colon adenocarcinoma to liver, MS-stable: - CEA level improved to 13.8.  LFTs show improved alkaline phosphatase at 132. - Susan Martin reported numbness tingling in the legs and hands which lasted about 2 to 3 days after last treatment.  Susan Martin still has some cold sensitivity. - I will decrease oxaliplatin dose by 20%. - Her white count is 4.6 with ANC of 1.5.  Potassium is 3.2.  We will give oral potassium today. - We will add udenyca on day 3. - RTC 2 weeks for follow-up.  Plan to repeat scan after cycle 6.  2. Iron deficiency anemia: -Hemoglobin 12.5 today.  Last Feraheme on 05/06/2020.  3. Abdominal pain: -Continue oxycodone every 6 hours as needed.  Susan Martin is not requiring much.   Orders placed this encounter:  No orders of the defined types were placed in this encounter.    Derek Jack, MD [place] [phone]   I, Milinda Antis, am acting as a scribe for Dr. Sanda Linger.  I, Derek Jack MD, have reviewed the above documentation for accuracy and completeness, and I agree with the above.

## 2020-07-17 NOTE — Progress Notes (Signed)
Patient assessed and labs reviewed by Dr. Delton Coombes. Okay to proceed with treatment. Dr. Raliegh Ip reduced Oxaliplatin dose 20% due to neuropathy in legs and ordered potassium 20mg . Primary RN and pharmacy aware.

## 2020-07-17 NOTE — Progress Notes (Signed)
Patient presents today for Avastin/FOLFOX.  Vital signs within parameters for treatment.  Labs pending.  Patient stated that she had severe numbness and weakness to her bilateral legs with last 5FU pump infusion, that resolved within 24 hours of having the pump discontinued.  Advised the patient to inform Dr. Delton Coombes of this issue so it can be addressed.  Patient expressed understanding.  No other complaints at this time.  Labs reviewed.  ANC noted to be 1.5 and Potassium 3.2.  Message received from [place] patient okay for treatment.  Oxaliplatin will be reduced 20% per MD and patient to get a one time dose of 20 mEq of Potassium.  Treatment given today per MD orders.  Tolerated infusion without adverse affects.  5FU pump connected and verified RUN on the screen with the patient.  Vital signs stable.  No complaints at this time.  Discharge from clinic ambulatory in stable condition.  Alert and oriented X 3.  Follow up with [facility] as scheduled.

## 2020-07-18 ENCOUNTER — Encounter (HOSPITAL_COMMUNITY): Payer: Self-pay | Admitting: *Deleted

## 2020-07-18 ENCOUNTER — Encounter (HOSPITAL_COMMUNITY): Payer: Self-pay

## 2020-07-18 NOTE — Progress Notes (Signed)
Patient called the clinic to advise that the needle had come out of her port.  She was advised to stop the pump and report to the clinic.    When patient arrived to clinic, dressing was intact and needle was out of the chest.  No visible fluid on skin.    Consulted with pharmacy and physician.  Port reaccessed and flushed without incidence.  Pump tubing reattached to patient.  Pump restarted. Running without incidence.    Patient was instructed to call the clinic if she has any trouble over night, otherwise we will see her tomorrow to deaccess the pump.

## 2020-07-19 ENCOUNTER — Inpatient Hospital Stay (HOSPITAL_COMMUNITY): Payer: Medicaid Other

## 2020-07-19 ENCOUNTER — Other Ambulatory Visit: Payer: Self-pay

## 2020-07-19 ENCOUNTER — Encounter (HOSPITAL_COMMUNITY): Payer: Self-pay

## 2020-07-19 VITALS — BP 137/72 | HR 89 | Temp 96.9°F | Resp 18

## 2020-07-19 DIAGNOSIS — C189 Malignant neoplasm of colon, unspecified: Secondary | ICD-10-CM

## 2020-07-19 DIAGNOSIS — C787 Secondary malignant neoplasm of liver and intrahepatic bile duct: Secondary | ICD-10-CM

## 2020-07-19 DIAGNOSIS — Z5111 Encounter for antineoplastic chemotherapy: Secondary | ICD-10-CM | POA: Diagnosis not present

## 2020-07-19 MED ORDER — PEGFILGRASTIM-CBQV 6 MG/0.6ML ~~LOC~~ SOSY
PREFILLED_SYRINGE | SUBCUTANEOUS | Status: AC
  Filled 2020-07-19: qty 0.6

## 2020-07-19 MED ORDER — PEGFILGRASTIM-CBQV 6 MG/0.6ML ~~LOC~~ SOSY
6.0000 mg | PREFILLED_SYRINGE | Freq: Once | SUBCUTANEOUS | Status: AC
  Administered 2020-07-19: 6 mg via SUBCUTANEOUS

## 2020-07-19 MED ORDER — HEPARIN SOD (PORK) LOCK FLUSH 100 UNIT/ML IV SOLN
500.0000 [IU] | Freq: Once | INTRAVENOUS | Status: AC | PRN
  Administered 2020-07-19: 500 [IU]

## 2020-07-19 MED ORDER — SODIUM CHLORIDE 0.9% FLUSH
10.0000 mL | INTRAVENOUS | Status: DC | PRN
  Administered 2020-07-19: 10 mL

## 2020-07-19 NOTE — Progress Notes (Signed)
..  The following Medication: Susan Martin is approved for drug replacement program by [employer]. The enrollment period is from 07/18/2020 to 07/18/2021.  Reason for Assistance: Self Pay. ID: [id number] First DOS:TBD.

## 2020-07-19 NOTE — Progress Notes (Signed)
Chemotherapy pump disconnected.  Udenyca injection given with no complaints voiced.  site clean and dry with no bruising or swelling noted.  band aid applied.  Patients port flushed without difficulty.  Good blood return noted with no bruising or swelling noted at site.  Band aid applied.  VSS with discharge and left in satisfactory condition with no s/s of distress noted.

## 2020-07-22 ENCOUNTER — Ambulatory Visit (HOSPITAL_COMMUNITY): Payer: Self-pay

## 2020-07-31 ENCOUNTER — Other Ambulatory Visit: Payer: Self-pay

## 2020-07-31 ENCOUNTER — Other Ambulatory Visit (HOSPITAL_COMMUNITY): Payer: Self-pay | Admitting: *Deleted

## 2020-07-31 ENCOUNTER — Inpatient Hospital Stay (HOSPITAL_COMMUNITY): Payer: Medicaid Other

## 2020-07-31 ENCOUNTER — Inpatient Hospital Stay (HOSPITAL_BASED_OUTPATIENT_CLINIC_OR_DEPARTMENT_OTHER): Payer: Medicaid Other | Admitting: Hematology

## 2020-07-31 ENCOUNTER — Inpatient Hospital Stay (HOSPITAL_COMMUNITY): Payer: Medicaid Other | Attending: Hematology

## 2020-07-31 VITALS — BP 145/86 | HR 86 | Temp 97.0°F | Resp 18 | Wt 132.4 lb

## 2020-07-31 VITALS — BP 150/77 | HR 75 | Temp 97.1°F | Resp 18

## 2020-07-31 DIAGNOSIS — R109 Unspecified abdominal pain: Secondary | ICD-10-CM | POA: Insufficient documentation

## 2020-07-31 DIAGNOSIS — C189 Malignant neoplasm of colon, unspecified: Secondary | ICD-10-CM | POA: Insufficient documentation

## 2020-07-31 DIAGNOSIS — C787 Secondary malignant neoplasm of liver and intrahepatic bile duct: Secondary | ICD-10-CM

## 2020-07-31 DIAGNOSIS — D509 Iron deficiency anemia, unspecified: Secondary | ICD-10-CM | POA: Insufficient documentation

## 2020-07-31 DIAGNOSIS — F419 Anxiety disorder, unspecified: Secondary | ICD-10-CM

## 2020-07-31 DIAGNOSIS — Z79899 Other long term (current) drug therapy: Secondary | ICD-10-CM | POA: Diagnosis not present

## 2020-07-31 DIAGNOSIS — Z90710 Acquired absence of both cervix and uterus: Secondary | ICD-10-CM | POA: Insufficient documentation

## 2020-07-31 DIAGNOSIS — Z809 Family history of malignant neoplasm, unspecified: Secondary | ICD-10-CM | POA: Diagnosis not present

## 2020-07-31 DIAGNOSIS — Z90722 Acquired absence of ovaries, bilateral: Secondary | ICD-10-CM | POA: Diagnosis not present

## 2020-07-31 DIAGNOSIS — Z801 Family history of malignant neoplasm of trachea, bronchus and lung: Secondary | ICD-10-CM | POA: Insufficient documentation

## 2020-07-31 DIAGNOSIS — R200 Anesthesia of skin: Secondary | ICD-10-CM | POA: Insufficient documentation

## 2020-07-31 DIAGNOSIS — R5383 Other fatigue: Secondary | ICD-10-CM | POA: Insufficient documentation

## 2020-07-31 DIAGNOSIS — G629 Polyneuropathy, unspecified: Secondary | ICD-10-CM | POA: Insufficient documentation

## 2020-07-31 DIAGNOSIS — F418 Other specified anxiety disorders: Secondary | ICD-10-CM | POA: Insufficient documentation

## 2020-07-31 DIAGNOSIS — I10 Essential (primary) hypertension: Secondary | ICD-10-CM | POA: Insufficient documentation

## 2020-07-31 DIAGNOSIS — R232 Flushing: Secondary | ICD-10-CM | POA: Insufficient documentation

## 2020-07-31 DIAGNOSIS — R911 Solitary pulmonary nodule: Secondary | ICD-10-CM | POA: Insufficient documentation

## 2020-07-31 DIAGNOSIS — Z87891 Personal history of nicotine dependence: Secondary | ICD-10-CM | POA: Diagnosis not present

## 2020-07-31 DIAGNOSIS — Z5111 Encounter for antineoplastic chemotherapy: Secondary | ICD-10-CM | POA: Insufficient documentation

## 2020-07-31 LAB — CBC WITH DIFFERENTIAL/PLATELET
Abs Immature Granulocytes: 0.43 10*3/uL — ABNORMAL HIGH (ref 0.00–0.07)
Basophils Absolute: 0.1 10*3/uL (ref 0.0–0.1)
Basophils Relative: 0 %
Eosinophils Absolute: 0 10*3/uL (ref 0.0–0.5)
Eosinophils Relative: 0 %
HCT: 39.3 % (ref 36.0–46.0)
Hemoglobin: 13 g/dL (ref 12.0–15.0)
Immature Granulocytes: 2 %
Lymphocytes Relative: 14 %
Lymphs Abs: 2.8 10*3/uL (ref 0.7–4.0)
MCH: 28.8 pg (ref 26.0–34.0)
MCHC: 33.1 g/dL (ref 30.0–36.0)
MCV: 87.1 fL (ref 80.0–100.0)
Monocytes Absolute: 1.5 10*3/uL — ABNORMAL HIGH (ref 0.1–1.0)
Monocytes Relative: 8 %
Neutro Abs: 15.1 10*3/uL — ABNORMAL HIGH (ref 1.7–7.7)
Neutrophils Relative %: 76 %
Platelets: 190 10*3/uL (ref 150–400)
RBC: 4.51 MIL/uL (ref 3.87–5.11)
RDW: 14.8 % (ref 11.5–15.5)
WBC: 20 10*3/uL — ABNORMAL HIGH (ref 4.0–10.5)
nRBC: 0 % (ref 0.0–0.2)

## 2020-07-31 LAB — COMPREHENSIVE METABOLIC PANEL
ALT: 15 U/L (ref 0–44)
AST: 20 U/L (ref 15–41)
Albumin: 3.1 g/dL — ABNORMAL LOW (ref 3.5–5.0)
Alkaline Phosphatase: 161 U/L — ABNORMAL HIGH (ref 38–126)
Anion gap: 10 (ref 5–15)
BUN: 11 mg/dL (ref 6–20)
CO2: 29 mmol/L (ref 22–32)
Calcium: 8.9 mg/dL (ref 8.9–10.3)
Chloride: 91 mmol/L — ABNORMAL LOW (ref 98–111)
Creatinine, Ser: 0.74 mg/dL (ref 0.44–1.00)
GFR, Estimated: >60 mL/min (ref >60)
Glucose, Bld: 138 mg/dL — ABNORMAL HIGH (ref 70–99)
Potassium: 3.4 mmol/L — ABNORMAL LOW (ref 3.5–5.1)
Sodium: 130 mmol/L — ABNORMAL LOW (ref 135–145)
Total Bilirubin: 0.5 mg/dL (ref 0.3–1.2)
Total Protein: 7.9 g/dL (ref 6.5–8.1)

## 2020-07-31 LAB — MAGNESIUM: Magnesium: 2 mg/dL (ref 1.7–2.4)

## 2020-07-31 MED ORDER — PALONOSETRON HCL INJECTION 0.25 MG/5ML
INTRAVENOUS | Status: AC
  Filled 2020-07-31: qty 5

## 2020-07-31 MED ORDER — SODIUM CHLORIDE 0.9 % IV SOLN: Freq: Once | INTRAVENOUS | Status: AC

## 2020-07-31 MED ORDER — DEXTROSE 5 % IV SOLN: Freq: Once | INTRAVENOUS | Status: AC

## 2020-07-31 MED ORDER — SODIUM CHLORIDE 0.9 % IV SOLN
5.0000 mg/kg | Freq: Once | INTRAVENOUS | Status: AC
  Administered 2020-07-31: 300 mg via INTRAVENOUS
  Filled 2020-07-31: qty 12

## 2020-07-31 MED ORDER — SODIUM CHLORIDE 0.9 % IV SOLN
2400.0000 mg/m2 | INTRAVENOUS | Status: DC
  Administered 2020-07-31: 3850 mg via INTRAVENOUS
  Filled 2020-07-31: qty 77

## 2020-07-31 MED ORDER — OXALIPLATIN CHEMO INJECTION 100 MG/20ML
68.0000 mg/m2 | Freq: Once | INTRAVENOUS | Status: AC
  Administered 2020-07-31: 110 mg via INTRAVENOUS
  Filled 2020-07-31: qty 20

## 2020-07-31 MED ORDER — SODIUM CHLORIDE 0.9% FLUSH: 10.0000 mL | INTRAVENOUS | Status: DC | PRN

## 2020-07-31 MED ORDER — LEUCOVORIN CALCIUM INJECTION 350 MG
400.0000 mg/m2 | Freq: Once | INTRAVENOUS | Status: AC
  Administered 2020-07-31: 644 mg via INTRAVENOUS
  Filled 2020-07-31: qty 32.2

## 2020-07-31 MED ORDER — FLUOROURACIL CHEMO INJECTION 2.5 GM/50ML
400.0000 mg/m2 | Freq: Once | INTRAVENOUS | Status: AC
  Administered 2020-07-31: 650 mg via INTRAVENOUS
  Filled 2020-07-31: qty 13

## 2020-07-31 MED ORDER — PALONOSETRON HCL INJECTION 0.25 MG/5ML
0.2500 mg | Freq: Once | INTRAVENOUS | Status: AC
  Administered 2020-07-31: 0.25 mg via INTRAVENOUS

## 2020-07-31 MED ORDER — HYDROXYZINE PAMOATE 25 MG PO CAPS: 25.0000 mg | ORAL_CAPSULE | Freq: Three times a day (TID) | ORAL | 2 refills | Status: DC | PRN

## 2020-07-31 MED ORDER — SODIUM CHLORIDE 0.9 % IV SOLN
10.0000 mg | Freq: Once | INTRAVENOUS | Status: AC
  Administered 2020-07-31: 10 mg via INTRAVENOUS
  Filled 2020-07-31: qty 1

## 2020-07-31 NOTE — Patient Instructions (Signed)
[facility] at [facility] Discharge Instructions  Labs drawn from portacath today   Thank you for choosing [facility] at [facility] to provide your oncology and hematology care.  To afford each patient quality time with our provider, please arrive at least 15 minutes before your scheduled appointment time.   If you have a lab appointment with the Cancer Center please come in thru the Main Entrance and check in at the main information desk.  You need to re-schedule your appointment should you arrive 10 or more minutes late.  We strive to give you quality time with our providers, and arriving late affects you and other patients whose appointments are after yours.  Also, if you no show three or more times for appointments you may be dismissed from the clinic at the providers discretion.     Again, thank you for choosing [facility].  Our hope is that these requests will decrease the amount of time that you wait before being seen by our physicians.       _____________________________________________________________  Should you have questions after your visit to [facility], please contact our office at [phone] and follow the prompts.  Our office hours are 8:00 a.m. and 4:30 p.m. Monday - Friday.  Please note that voicemails left after 4:00 p.m. may not be returned until the following business day.  We are closed weekends and major holidays.  You do have access to a nurse 24-7, just call the main number to the clinic [phone] and do not press any options, hold on the line and a nurse will answer the phone.    For prescription refill requests, have your pharmacy contact our office and allow 72 hours.    Due to Covid, you will need to wear a mask upon entering the hospital. If you do not have a mask, a mask will be given to you at the Main Entrance upon arrival. For doctor visits, patients may have 1 support person age 18  or older with them. For treatment visits, patients can not have anyone with them due to social distancing guidelines and our immunocompromised population.     

## 2020-07-31 NOTE — Patient Instructions (Signed)
[place] at [facility] Discharge Instructions  You were seen today by Dr. Delton Coombes. He went over your recent results. You received your treatment today; continue getting your treatment every 2 weeks. Continue staying physically active to maintain your energy levels. You will be scheduled for a CT scan of your chest and abdomen before your next visit. Dr. Delton Coombes will see you back in 4 weeks for labs and follow up.   Thank you for choosing [place] at [facility] to provide your oncology and hematology care.  To afford each patient quality time with our provider, please arrive at least 15 minutes before your scheduled appointment time.   If you have a lab appointment with the [place] please come in thru the Main Entrance and check in at the main information desk  You need to re-schedule your appointment should you arrive 10 or more minutes late.  We strive to give you quality time with our providers, and arriving late affects you and other patients whose appointments are after yours.  Also, if you no show three or more times for appointments you may be dismissed from the clinic at the providers discretion.     Again, thank you for choosing [facility].  Our hope is that these requests will decrease the amount of time that you wait before being seen by our physicians.       _____________________________________________________________  Should you have questions after your visit to [facility], please contact our office at [phone] between the hours of 8:00 a.m. and 4:30 p.m.  Voicemails left after 4:00 p.m. will not be returned until the following business day.  For prescription refill requests, have your pharmacy contact our office and allow 72 hours.    Cancer Center Support Programs:   > Cancer Support Group  2nd Tuesday of the month 1pm-2pm, Journey Room

## 2020-07-31 NOTE — Patient Instructions (Signed)
[facility] Discharge Instructions for Patients Receiving Chemotherapy  Today you received the following chemotherapy agents   To help prevent nausea and vomiting after your treatment, we encourage you to take your nausea medication   If you develop nausea and vomiting that is not controlled by your nausea medication, call the clinic.   BELOW ARE SYMPTOMS THAT SHOULD BE REPORTED IMMEDIATELY:  *FEVER GREATER THAN 100.5 F  *CHILLS WITH OR WITHOUT FEVER  NAUSEA AND VOMITING THAT IS NOT CONTROLLED WITH YOUR NAUSEA MEDICATION  *UNUSUAL SHORTNESS OF BREATH  *UNUSUAL BRUISING OR BLEEDING  TENDERNESS IN MOUTH AND THROAT WITH OR WITHOUT PRESENCE OF ULCERS  *URINARY PROBLEMS  *BOWEL PROBLEMS  UNUSUAL RASH Items with * indicate a potential emergency and should be followed up as soon as possible.  Feel free to call the clinic should you have any questions or concerns. The clinic phone number is [phone].  Please show the CHEMO ALERT CARD at check-in to the Emergency Department and triage nurse.   

## 2020-07-31 NOTE — Progress Notes (Signed)
[address], [place] [address]   CLINIC:  Medical Oncology/Hematology  PCP:  Patient, Susan Martin Martin   REASON FOR VISIT:  Follow-up for metastatic colon cancer to liver  PRIOR THERAPY: Sigmoid colectomy and end colostomy on 04/29/2020  NGS Results: Foundation 1 KRAS wildtype, MS--stable, TMB 4 Muts/Mb  CURRENT THERAPY: FOLFOX, Avastin & Aloxi every 2 weeks  BRIEF ONCOLOGIC HISTORY:  Oncology History  Metastatic colon cancer to liver ([place])  05/16/2020 Initial Diagnosis   Metastatic colon cancer to liver ([place])   06/03/2020 Genetic Testing   Foundation One:     06/04/2020 -  Chemotherapy    Patient is on Treatment Plan: COLORECTAL FOLFOX + BEVACIZUMAB Q14D        CANCER STAGING: Cancer Staging Susan matching staging information was found for the patient.  INTERVAL HISTORY:  Susan Martin, a 60 y.o. female, returns for routine follow-up and consideration for next cycle of chemotherapy. [name] was last seen on 07/17/2020.  Due for cycle #5 of FOLFOX, Avastin and Aloxi today.   Overall, she tells me she has been feeling okay. She tolerated the previous treatment and reports having some diarrhea and tingling in her fingertips which resolve after the pump comes off; she denies having N/V. She also complains of having lower abdominal pain whenever she gets gassy and resolves after having a BM. She is taking oxycodone only as needed for pain. Her energy levels are depleted.  Overall, she feels ready for next cycle of chemo today.    REVIEW OF SYSTEMS:  Review of Systems  Constitutional: Positive for appetite change (50%) and fatigue (depleted).  Gastrointestinal: Positive for abdominal pain (3/10 lower abdominal pain) and diarrhea. Negative for nausea and vomiting.  Endocrine: Positive for hot flashes.  Neurological: Positive for numbness (tingling in fingertips until pump is removed).  All other systems reviewed and are  negative.   PAST MEDICAL/SURGICAL HISTORY:  Past Medical History:  Diagnosis Date  . 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 Delton Coombes   04-29-2020 emergerency surgery for perforated colon s/p sigmoid colectomy w/ colostomy; dx  Stage IV colon cancer mets to liver  . Anxiety   . Depression   . Hypertension    followed by dr Glo Herring and oncology  (05-27-2020 per pt does not have pcp yet)  . IDA (iron deficiency anemia)   . Pulmonary nodule, right    Past Surgical History:  Procedure Laterality Date  . ABDOMINAL HYSTERECTOMY  03-31-2016   @[facility]    W/  BILATERAL SALPINOOPHORECTOMY   . APPLICATION OF WOUND VAC N/A 04/29/2020   Procedure: APPLICATION OF WOUND VAC;  Surgeon: Kinsinger, Arta Bruce, MD;  Location: [place];  Service: General;  Laterality: N/A;  . ENDOMETRIAL ABLATION    . LAPAROTOMY N/A 04/29/2020   Procedure: EXPLORATORY LAPAROTOMY;  Surgeon: Kieth Brightly Arta Bruce, MD;  Location: [place];  Service: General;  Laterality: N/A;  . PARTIAL COLECTOMY N/A 04/29/2020   Procedure: PARTIAL COLECTOMY WITH END COLOSTOMY;  Surgeon: Kieth Brightly Arta Bruce, MD;  Location: [place];  Service: General;  Laterality: N/A;  . PORTACATH PLACEMENT Right 05/28/2020   Procedure: INSERTION PORT-A-CATH;  Surgeon: Mickeal Skinner, MD;  Location: [facility];  Service: General;  Laterality: Right;  . SALPINGOOPHORECTOMY Left 03/31/2016   Procedure: LEFT SALPINGO OOPHORECTOMY WITH FROZEN SECTION,  ABDOMINAL HYSTERECTOMY WITH BILATERAL SALPINGO-OOPHORECTOMY;  Surgeon: Jonnie Kind, MD;  Location: [facility];  Service: Gynecology;  Laterality: Left;    SOCIAL HISTORY:  Social History   Socioeconomic History  . Marital status: Divorced    Spouse name: Not on file  . Number of children: Not on file  . Years of education: Not on file  . Highest education level: Not on file  Occupational History  . Not on file  Tobacco Use  . Smoking status: Former Smoker     Packs/day: 0.50    Years: 5.00    Pack years: 2.50    Types: Cigarettes    Quit date: 03/28/2015    Years since quitting: 5.3  . Smokeless tobacco: Never Used  Vaping Use  . Vaping Use: Never used  Substance and Sexual Activity  . Alcohol use: Susan  . Drug use: Never  . Sexual activity: Yes    Partners: Male    Birth control/protection: Surgical  Other Topics Concern  . Not on file  Social History Narrative  . Not on file   Social Determinants of Health   Financial Resource Strain: Low Risk   . Difficulty of Paying Living Expenses: Not very hard  Food Insecurity: Susan Food Insecurity  . Worried About Charity fundraiser in the Last Year: Never true  . Ran Out of Food in the Last Year: Never true  Transportation Needs: Susan Transportation Needs  . Lack of Transportation (Medical): Susan  . Lack of Transportation (Non-Medical): Susan  Physical Activity: Insufficiently Active  . Days of Exercise per Week: 2 days  . Minutes of Exercise per Session: 30 min  Stress: Stress Concern Present  . Feeling of Stress : To some extent  Social Connections: Moderately Integrated  . Frequency of Communication with Friends and Family: More than three times a week  . Frequency of Social Gatherings with Friends and Family: Once a week  . Attends Religious Services: More than 4 times per year  . Active Member of Clubs or Organizations: Susan  . Attends Archivist Meetings: Never  . Marital Status: Living with partner  Intimate Partner Violence: Not At Risk  . Fear of Current or Ex-Partner: Susan  . Emotionally Abused: Susan  . Physically Abused: Susan  . Sexually Abused: Susan    FAMILY HISTORY:  Family History  Problem Relation Age of Onset  . Hypertension Mother   . Diabetes Mother   . Cirrhosis Father     CURRENT MEDICATIONS:  Current Outpatient Medications  Medication Sig Dispense Refill  . BEVACIZUMAB IV Inject 5 mg/kg into the vein every 14 (fourteen) days.    . fluorouracil CALGB 60454 in  sodium chloride 0.9 % 150 mL Inject 2,400 mg/m2 into the vein over 48 hr.    . LEUCOVORIN CALCIUM IV Inject 400 mg/m2 into the vein every 21 ( twenty-one) days.    . OXALIPLATIN IV Inject 85 mg/m2 into the vein every 14 (fourteen) days.    Marland Kitchen PARoxetine (PAXIL) 40 MG tablet Take 1 tablet (40 mg total) by mouth every morning. 90 tablet 3  . polyethylene glycol (MIRALAX / GLYCOLAX) packet Take 17 g by mouth every other day.    . triamterene-hydrochlorothiazide (DYAZIDE) 37.5-25 MG capsule Take 1 each (1 capsule total) by mouth daily. (Patient taking differently: Take 1 capsule by mouth daily.) 30 capsule 11  . hydrOXYzine (VISTARIL) 25 MG capsule Take 1 capsule (25 mg total) by mouth 3 (three) times daily as needed. Prn anxiety (Patient not taking: Reported on 07/31/2020) 60 capsule 2  . lidocaine-prilocaine (EMLA) cream Apply small  amount to port a cath site and cover with plastic wrap 1 hour prior to chemotherapy appointments (Patient not taking: Reported on 07/31/2020) 30 g 3  . oxyCODONE (OXY IR/ROXICODONE) 5 MG immediate release tablet TAKE 1 TABLET BY MOUTH EVERY 6 HOURS AS NEEDED FOR MODERATE PAIN. (Patient not taking: Reported on 07/31/2020) 120 tablet 0  . prochlorperazine (COMPAZINE) 10 MG tablet Take 1 tablet (10 mg total) by mouth every 6 (six) hours as needed (Nausea or vomiting). (Patient not taking: Reported on 07/31/2020) 30 tablet 1   Susan current facility-administered medications for this visit.    ALLERGIES:  Susan Known Allergies  PHYSICAL EXAM:  Performance status (ECOG): 1 - Symptomatic but completely ambulatory  Vitals:   07/31/20 0824  BP: (!) 145/86  Pulse: 86  Resp: 18  Temp: (!) 97 F (36.1 C)  SpO2: 98%   Wt Readings from Last 3 Encounters:  07/31/20 132 lb 6.4 oz (60.1 kg)  07/17/20 134 lb 12.8 oz (61.1 kg)  07/02/20 135 lb 8 oz (61.5 kg)   Physical Exam Vitals reviewed.  Constitutional:      Appearance: Normal appearance.  Cardiovascular:     Rate and Rhythm:  Normal rate and regular rhythm.     Pulses: Normal pulses.     Heart sounds: Normal heart sounds.  Pulmonary:     Effort: Pulmonary effort is normal.     Breath sounds: Normal breath sounds.  Chest:  Breasts:     Right: Susan axillary adenopathy.     Left: Susan axillary adenopathy.      Comments: Port-a-Cath in R chest Abdominal:     Palpations: Abdomen is soft. There is hepatomegaly (liver margin palpable and TTP w/ deep inspiration). There is Susan mass.     Tenderness: There is Susan abdominal tenderness.     Hernia: Susan hernia is present.     Comments: Colostomy bag  Musculoskeletal:     Right lower leg: Susan edema.     Left lower leg: Susan edema.  Lymphadenopathy:     Cervical: Susan cervical adenopathy.     Upper Body:     Right upper body: Susan axillary or pectoral adenopathy.     Left upper body: Susan axillary or pectoral adenopathy.  Neurological:     General: Susan focal deficit present.     Mental Status: She is alert and oriented to person, place, and time.  Psychiatric:        Mood and Affect: Mood normal.        Behavior: Behavior normal.     LABORATORY DATA:  I have reviewed the labs as listed.  CBC Latest Ref Rng & Units 07/31/2020 07/17/2020 07/02/2020  WBC 4.0 - 10.5 K/uL 20.0(H) 4.6 5.9  Hemoglobin 12.0 - 15.0 g/dL 13.0 12.5 12.0  Hematocrit 36.0 - 46.0 % 39.3 37.8 37.0  Platelets 150 - 400 K/uL 190 188 239   CMP Latest Ref Rng & Units 07/31/2020 07/17/2020 07/02/2020  Glucose 70 - 99 mg/dL 138(H) 140(H) 140(H)  BUN 6 - 20 mg/dL 11 10 9   Creatinine 0.44 - 1.00 mg/dL 0.74 0.83 0.73  Sodium 135 - 145 mmol/L 130(L) 132(L) 130(L)  Potassium 3.5 - 5.1 mmol/L 3.4(L) 3.2(L) 3.7  Chloride 98 - 111 mmol/L 91(L) 95(L) 93(L)  CO2 22 - 32 mmol/L 29 28 27   Calcium 8.9 - 10.3 mg/dL 8.9 8.7(L) 8.7(L)  Total Protein 6.5 - 8.1 g/dL 7.9 7.4 7.4  Total Bilirubin 0.3 - 1.2 mg/dL 0.5 0.5 0.4  Alkaline Phos 38 - 126 U/L 161(H) 132(H) 171(H)  AST 15 - 41 U/L 20 22 19   ALT 0 - 44 U/L 15 15 15    Lab  Results  Component Value Date   CEA1 13.8 (H) 07/02/2020   CEA1 24.8 (H) 06/04/2020   CEA1 60.2 (H) 05/02/2020    DIAGNOSTIC IMAGING:  I have independently reviewed the scans and discussed with the patient. Susan results found.   ASSESSMENT:  1. Metastatic colon adenocarcinoma to liver: -Sigmoid colectomy and end colostomy on 04/29/2020 -Pathology pT4a,N1C (1 tumor deposit), 0/8 lymph nodes involved -MMR proficient, MSI-stable -Liver biopsy on May 03, 2020-adenocarcinoma consistent with colon primary. -CT chest on 05/02/2020 with Susan evidence of pulmonary metastatic disease. -CTAP on 05/07/2020 with multiple lesions throughout the liver, largest in the right lobe measuring 3.9 cm, lateral segment left lobe measuring 2.6 cm and inferior right lobe confluent lesion 4.2 x 3.7 cm. Susan lymphadenopathy. -CEA on 05/02/2020-60.2. -FOLFOX and bevacizumab started on 06/04/2020. -Foundation 1 testing shows MS-stable, KRAS/NRAS wild-type  2. Iron deficiency anemia: -Feraheme on 04/30/2020 and 05/06/2020.  3. 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 -Smoked for 3 to 4 years and quit. -Mother had cancer, type unknown. Maternal uncle had lung cancer. Maternal aunt had lung cancer.   PLAN:  1. Metastatic colon adenocarcinoma to liver, MS-stable: - Most recent CEA improved to 13.8. -I have reviewed her labs.  Alk phos is elevated at 161 and albumin low at 3.1.  Total bilirubin is normal.  Other LFTs are normal.  White count is elevated secondary to G-CSF. -We will proceed with cycle 5 today.  I plan to see her back in 4 weeks.  I plan to repeat CT CAP prior to next visit.  2. Iron deficiency anemia: -Hemoglobin is staying stable at 12-13.  She does not require any parenteral iron.  3. Abdominal pain: -She has more abdominal pain when she has gas in the bag.  She takes Percocet as needed which is helping.  4.  Peripheral neuropathy: -She had tingling in the  feet which went away after 2 days.  She denies any tingling at this time.  Continue oxaliplatin at a 20% dose reduction.  5.  Anxiety: -She takes hydroxyzine 25 mg 3 times a day as needed which helps.  We have given refill.   Orders placed this encounter:  Susan orders of the defined types were placed in this encounter.    Derek Jack, MD [place] [phone]   I, Milinda Antis, am acting as a scribe for Dr. Sanda Linger.  I, Derek Jack MD, have reviewed the above documentation for accuracy and completeness, and I agree with the above.

## 2020-07-31 NOTE — Progress Notes (Signed)
Patient was assessed by Dr. Katragadda and labs have been reviewed.  Patient is okay to proceed with treatment today. Primary RN and pharmacy aware.   

## 2020-07-31 NOTE — Progress Notes (Signed)
Pt is here for FOLFOX plus avastin.  ALK PHOS 161. K 3.4. okay for treatment today.  Tolerated treatment well today without incidence.  Discharged in stable condition ambulatory with ambulatory 14f pump.  Vital signs stable prior to discharge.

## 2020-08-01 LAB — CEA: CEA: 5.2 ng/mL — ABNORMAL HIGH (ref 0.0–4.7)

## 2020-08-02 ENCOUNTER — Other Ambulatory Visit: Payer: Self-pay

## 2020-08-02 ENCOUNTER — Inpatient Hospital Stay (HOSPITAL_COMMUNITY): Payer: Medicaid Other

## 2020-08-02 VITALS — BP 144/82 | HR 97 | Temp 96.9°F | Resp 18

## 2020-08-02 DIAGNOSIS — C787 Secondary malignant neoplasm of liver and intrahepatic bile duct: Secondary | ICD-10-CM

## 2020-08-02 DIAGNOSIS — C189 Malignant neoplasm of colon, unspecified: Secondary | ICD-10-CM

## 2020-08-02 DIAGNOSIS — Z5111 Encounter for antineoplastic chemotherapy: Secondary | ICD-10-CM | POA: Diagnosis not present

## 2020-08-02 MED ORDER — PEGFILGRASTIM-CBQV 6 MG/0.6ML ~~LOC~~ SOSY
PREFILLED_SYRINGE | SUBCUTANEOUS | Status: AC
  Filled 2020-08-02: qty 0.6

## 2020-08-02 MED ORDER — PEGFILGRASTIM-CBQV 6 MG/0.6ML ~~LOC~~ SOSY
6.0000 mg | PREFILLED_SYRINGE | Freq: Once | SUBCUTANEOUS | Status: AC
  Administered 2020-08-02: 6 mg via SUBCUTANEOUS

## 2020-08-02 NOTE — Progress Notes (Signed)
Patient tolerated Udencya injection with no complaints voiced.  Site clean and dry with no bruising or swelling noted.  No complaints of pain.  Patients port flushed without difficulty.  Good blood return noted with no bruising or swelling noted at site.  Band aid applied.  VSS with discharge and left in satisfactory condition with no s/s of distress noted.

## 2020-08-14 ENCOUNTER — Other Ambulatory Visit: Payer: Self-pay

## 2020-08-14 ENCOUNTER — Inpatient Hospital Stay (HOSPITAL_COMMUNITY): Payer: Medicaid Other | Attending: Hematology

## 2020-08-14 ENCOUNTER — Inpatient Hospital Stay (HOSPITAL_COMMUNITY): Payer: Medicaid Other

## 2020-08-14 VITALS — BP 161/79 | HR 74 | Temp 97.2°F | Resp 17

## 2020-08-14 DIAGNOSIS — C787 Secondary malignant neoplasm of liver and intrahepatic bile duct: Secondary | ICD-10-CM | POA: Insufficient documentation

## 2020-08-14 DIAGNOSIS — Z5111 Encounter for antineoplastic chemotherapy: Secondary | ICD-10-CM | POA: Diagnosis not present

## 2020-08-14 DIAGNOSIS — C189 Malignant neoplasm of colon, unspecified: Secondary | ICD-10-CM | POA: Insufficient documentation

## 2020-08-14 LAB — URINALYSIS, DIPSTICK ONLY
Bacteria, UA: NONE SEEN
Bilirubin Urine: NEGATIVE
Glucose, UA: NEGATIVE mg/dL
Hgb urine dipstick: NEGATIVE
Ketones, ur: NEGATIVE mg/dL
Nitrite: NEGATIVE
Protein, ur: NEGATIVE mg/dL
Specific Gravity, Urine: 1.012 (ref 1.005–1.030)
pH: 7 (ref 5.0–8.0)

## 2020-08-14 LAB — CBC WITH DIFFERENTIAL/PLATELET
Abs Immature Granulocytes: 0.37 10*3/uL — ABNORMAL HIGH (ref 0.00–0.07)
Basophils Absolute: 0.1 10*3/uL (ref 0.0–0.1)
Basophils Relative: 1 %
Eosinophils Absolute: 0.1 10*3/uL (ref 0.0–0.5)
Eosinophils Relative: 1 %
HCT: 39.1 % (ref 36.0–46.0)
Hemoglobin: 12.5 g/dL (ref 12.0–15.0)
Immature Granulocytes: 4 %
Lymphocytes Relative: 28 %
Lymphs Abs: 2.6 10*3/uL (ref 0.7–4.0)
MCH: 29.2 pg (ref 26.0–34.0)
MCHC: 32 g/dL (ref 30.0–36.0)
MCV: 91.4 fL (ref 80.0–100.0)
Monocytes Absolute: 0.9 10*3/uL (ref 0.1–1.0)
Monocytes Relative: 10 %
Neutro Abs: 5.2 10*3/uL (ref 1.7–7.7)
Neutrophils Relative %: 56 %
Platelets: 196 10*3/uL (ref 150–400)
RBC: 4.28 MIL/uL (ref 3.87–5.11)
RDW: 16.5 % — ABNORMAL HIGH (ref 11.5–15.5)
WBC: 9.2 10*3/uL (ref 4.0–10.5)
nRBC: 0 % (ref 0.0–0.2)

## 2020-08-14 LAB — COMPREHENSIVE METABOLIC PANEL
ALT: 28 U/L (ref 0–44)
AST: 32 U/L (ref 15–41)
Albumin: 3.2 g/dL — ABNORMAL LOW (ref 3.5–5.0)
Alkaline Phosphatase: 138 U/L — ABNORMAL HIGH (ref 38–126)
Anion gap: 7 (ref 5–15)
BUN: 9 mg/dL (ref 6–20)
CO2: 27 mmol/L (ref 22–32)
Calcium: 8.9 mg/dL (ref 8.9–10.3)
Chloride: 101 mmol/L (ref 98–111)
Creatinine, Ser: 0.78 mg/dL (ref 0.44–1.00)
GFR, Estimated: >60 mL/min (ref >60)
Glucose, Bld: 133 mg/dL — ABNORMAL HIGH (ref 70–99)
Potassium: 3.6 mmol/L (ref 3.5–5.1)
Sodium: 135 mmol/L (ref 135–145)
Total Bilirubin: 0.4 mg/dL (ref 0.3–1.2)
Total Protein: 6.7 g/dL (ref 6.5–8.1)

## 2020-08-14 LAB — MAGNESIUM: Magnesium: 2.1 mg/dL (ref 1.7–2.4)

## 2020-08-14 MED ORDER — DEXTROSE 5 % IV SOLN: Freq: Once | INTRAVENOUS | Status: AC

## 2020-08-14 MED ORDER — SODIUM CHLORIDE 0.9 % IV SOLN
10.0000 mg | Freq: Once | INTRAVENOUS | Status: AC
  Administered 2020-08-14: 10 mg via INTRAVENOUS
  Filled 2020-08-14: qty 10

## 2020-08-14 MED ORDER — SODIUM CHLORIDE 0.9 % IV SOLN: INTRAVENOUS | Status: DC

## 2020-08-14 MED ORDER — SODIUM CHLORIDE 0.9 % IV SOLN
2400.0000 mg/m2 | INTRAVENOUS | Status: DC
  Administered 2020-08-14: 3850 mg via INTRAVENOUS
  Filled 2020-08-14: qty 77

## 2020-08-14 MED ORDER — FLUOROURACIL CHEMO INJECTION 2.5 GM/50ML
400.0000 mg/m2 | Freq: Once | INTRAVENOUS | Status: AC
  Administered 2020-08-14: 650 mg via INTRAVENOUS
  Filled 2020-08-14: qty 13

## 2020-08-14 MED ORDER — PALONOSETRON HCL INJECTION 0.25 MG/5ML
0.2500 mg | Freq: Once | INTRAVENOUS | Status: AC
  Administered 2020-08-14: 0.25 mg via INTRAVENOUS
  Filled 2020-08-14: qty 5

## 2020-08-14 MED ORDER — BEVACIZUMAB-AWWB CHEMO INJECTION 400 MG/16ML
5.0000 mg/kg | Freq: Once | INTRAVENOUS | Status: AC
Start: 2020-08-14 — End: 2020-08-14
  Administered 2020-08-14: 300 mg via INTRAVENOUS
  Filled 2020-08-14: qty 12

## 2020-08-14 MED ORDER — LEUCOVORIN CALCIUM INJECTION 350 MG
400.0000 mg/m2 | Freq: Once | INTRAVENOUS | Status: AC
  Administered 2020-08-14: 644 mg via INTRAVENOUS
  Filled 2020-08-14: qty 25

## 2020-08-14 MED ORDER — SODIUM CHLORIDE 0.9% FLUSH: 10.0000 mL | INTRAVENOUS | Status: DC | PRN

## 2020-08-14 MED ORDER — HEPARIN SOD (PORK) LOCK FLUSH 100 UNIT/ML IV SOLN: 500.0000 [IU] | Freq: Once | INTRAVENOUS | Status: DC | PRN

## 2020-08-14 MED ORDER — SODIUM CHLORIDE 0.9 % IV SOLN: Freq: Once | INTRAVENOUS | Status: AC

## 2020-08-14 MED ORDER — OXALIPLATIN CHEMO INJECTION 100 MG/20ML
68.0000 mg/m2 | Freq: Once | INTRAVENOUS | Status: AC
  Administered 2020-08-14: 110 mg via INTRAVENOUS
  Filled 2020-08-14: qty 20

## 2020-08-14 NOTE — Progress Notes (Signed)
Patient presents today for Avastin/FOLFOX infusion with pump start.  Vital signs within parameters for treatment.  Patient states that she feels better than she has in weeks.  Labs pending.  Treatment given today per MD orders.  Tolerated infusion without adverse affects. 5FU pump connected and verified RUN with the patient.  Vital signs stable.  No complaints at this time.  Discharge from clinic ambulatory in stable condition.  Alert and oriented X 3.  Follow up with [facility] as scheduled.

## 2020-08-14 NOTE — Patient Instructions (Signed)
[facility] Discharge Instructions for Patients Receiving Chemotherapy  Today you received the following chemotherapy agents   To help prevent nausea and vomiting after your treatment, we encourage you to take your nausea medication   If you develop nausea and vomiting that is not controlled by your nausea medication, call the clinic.   BELOW ARE SYMPTOMS THAT SHOULD BE REPORTED IMMEDIATELY:  *FEVER GREATER THAN 100.5 F  *CHILLS WITH OR WITHOUT FEVER  NAUSEA AND VOMITING THAT IS NOT CONTROLLED WITH YOUR NAUSEA MEDICATION  *UNUSUAL SHORTNESS OF BREATH  *UNUSUAL BRUISING OR BLEEDING  TENDERNESS IN MOUTH AND THROAT WITH OR WITHOUT PRESENCE OF ULCERS  *URINARY PROBLEMS  *BOWEL PROBLEMS  UNUSUAL RASH Items with * indicate a potential emergency and should be followed up as soon as possible.  Feel free to call the clinic should you have any questions or concerns. The clinic phone number is [phone].  Please show the CHEMO ALERT CARD at check-in to the Emergency Department and triage nurse.   

## 2020-08-16 ENCOUNTER — Other Ambulatory Visit: Payer: Self-pay

## 2020-08-16 ENCOUNTER — Inpatient Hospital Stay (HOSPITAL_COMMUNITY): Payer: Medicaid Other

## 2020-08-16 VITALS — BP 126/84 | HR 83 | Temp 96.8°F | Resp 19 | Wt 140.0 lb

## 2020-08-16 DIAGNOSIS — C189 Malignant neoplasm of colon, unspecified: Secondary | ICD-10-CM

## 2020-08-16 DIAGNOSIS — Z5111 Encounter for antineoplastic chemotherapy: Secondary | ICD-10-CM | POA: Diagnosis not present

## 2020-08-16 DIAGNOSIS — C787 Secondary malignant neoplasm of liver and intrahepatic bile duct: Secondary | ICD-10-CM

## 2020-08-16 MED ORDER — PEGFILGRASTIM-CBQV 6 MG/0.6ML ~~LOC~~ SOSY
6.0000 mg | PREFILLED_SYRINGE | Freq: Once | SUBCUTANEOUS | Status: AC
Start: 2020-08-16 — End: 2020-08-16
  Administered 2020-08-16: 6 mg via SUBCUTANEOUS

## 2020-08-16 NOTE — Progress Notes (Signed)
Patients port flushed without difficulty.  Good blood return noted with no bruising or swelling noted at site.  Band aid applied.  VSS with discharge and left in satisfactory condition with no s/s of distress noted.   Patient tolerated injection with no complaints voiced.  Site clean and dry with no bruising or swelling noted.  No complaints of pain.  Discharged with vital signs stable and no signs or symptoms of distress noted.

## 2020-08-27 ENCOUNTER — Other Ambulatory Visit: Payer: Self-pay

## 2020-08-27 ENCOUNTER — Ambulatory Visit (HOSPITAL_COMMUNITY)
Admission: RE | Admit: 2020-08-27 | Discharge: 2020-08-27 | Disposition: A | Discharge status: Home / Self Care | Payer: Medicaid Other | Source: Ambulatory Visit | Attending: Hematology | Admitting: Hematology

## 2020-08-27 DIAGNOSIS — C189 Malignant neoplasm of colon, unspecified: Secondary | ICD-10-CM | POA: Insufficient documentation

## 2020-08-27 DIAGNOSIS — C787 Secondary malignant neoplasm of liver and intrahepatic bile duct: Secondary | ICD-10-CM | POA: Diagnosis present

## 2020-08-27 MED ORDER — IOHEXOL 300 MG/ML  SOLN
100.0000 mL | Freq: Once | INTRAMUSCULAR | Status: AC | PRN
  Administered 2020-08-27: 100 mL via INTRAVENOUS

## 2020-08-28 ENCOUNTER — Inpatient Hospital Stay (HOSPITAL_COMMUNITY): Payer: Medicaid Other | Attending: Hematology

## 2020-08-28 ENCOUNTER — Inpatient Hospital Stay (HOSPITAL_BASED_OUTPATIENT_CLINIC_OR_DEPARTMENT_OTHER): Payer: Medicaid Other | Admitting: Hematology

## 2020-08-28 ENCOUNTER — Inpatient Hospital Stay (HOSPITAL_COMMUNITY): Payer: Medicaid Other

## 2020-08-28 VITALS — BP 165/85 | HR 83 | Temp 96.6°F | Resp 18 | Wt 138.8 lb

## 2020-08-28 VITALS — BP 172/79 | HR 82 | Temp 97.0°F | Resp 18

## 2020-08-28 DIAGNOSIS — R911 Solitary pulmonary nodule: Secondary | ICD-10-CM | POA: Insufficient documentation

## 2020-08-28 DIAGNOSIS — G629 Polyneuropathy, unspecified: Secondary | ICD-10-CM | POA: Insufficient documentation

## 2020-08-28 DIAGNOSIS — Z5111 Encounter for antineoplastic chemotherapy: Secondary | ICD-10-CM | POA: Insufficient documentation

## 2020-08-28 DIAGNOSIS — F418 Other specified anxiety disorders: Secondary | ICD-10-CM | POA: Diagnosis not present

## 2020-08-28 DIAGNOSIS — C189 Malignant neoplasm of colon, unspecified: Secondary | ICD-10-CM | POA: Diagnosis not present

## 2020-08-28 DIAGNOSIS — C787 Secondary malignant neoplasm of liver and intrahepatic bile duct: Secondary | ICD-10-CM | POA: Diagnosis not present

## 2020-08-28 DIAGNOSIS — Z90722 Acquired absence of ovaries, bilateral: Secondary | ICD-10-CM | POA: Diagnosis not present

## 2020-08-28 DIAGNOSIS — I10 Essential (primary) hypertension: Secondary | ICD-10-CM | POA: Diagnosis not present

## 2020-08-28 DIAGNOSIS — Z90710 Acquired absence of both cervix and uterus: Secondary | ICD-10-CM | POA: Insufficient documentation

## 2020-08-28 DIAGNOSIS — D509 Iron deficiency anemia, unspecified: Secondary | ICD-10-CM | POA: Insufficient documentation

## 2020-08-28 DIAGNOSIS — R109 Unspecified abdominal pain: Secondary | ICD-10-CM | POA: Diagnosis not present

## 2020-08-28 LAB — CBC WITH DIFFERENTIAL/PLATELET
Abs Immature Granulocytes: 0.13 10*3/uL — ABNORMAL HIGH (ref 0.00–0.07)
Basophils Absolute: 0.1 10*3/uL (ref 0.0–0.1)
Basophils Relative: 1 %
Eosinophils Absolute: 0.2 10*3/uL (ref 0.0–0.5)
Eosinophils Relative: 2 %
HCT: 42.1 % (ref 36.0–46.0)
Hemoglobin: 13.6 g/dL (ref 12.0–15.0)
Immature Granulocytes: 1 %
Lymphocytes Relative: 30 %
Lymphs Abs: 3.2 10*3/uL (ref 0.7–4.0)
MCH: 29.7 pg (ref 26.0–34.0)
MCHC: 32.3 g/dL (ref 30.0–36.0)
MCV: 91.9 fL (ref 80.0–100.0)
Monocytes Absolute: 1.1 10*3/uL — ABNORMAL HIGH (ref 0.1–1.0)
Monocytes Relative: 10 %
Neutro Abs: 5.9 10*3/uL (ref 1.7–7.7)
Neutrophils Relative %: 56 %
Platelets: 182 10*3/uL (ref 150–400)
RBC: 4.58 MIL/uL (ref 3.87–5.11)
RDW: 18.1 % — ABNORMAL HIGH (ref 11.5–15.5)
WBC: 10.6 10*3/uL — ABNORMAL HIGH (ref 4.0–10.5)
nRBC: 0 % (ref 0.0–0.2)

## 2020-08-28 LAB — COMPREHENSIVE METABOLIC PANEL
ALT: 26 U/L (ref 0–44)
AST: 33 U/L (ref 15–41)
Albumin: 3.6 g/dL (ref 3.5–5.0)
Alkaline Phosphatase: 149 U/L — ABNORMAL HIGH (ref 38–126)
Anion gap: 11 (ref 5–15)
BUN: 10 mg/dL (ref 6–20)
CO2: 27 mmol/L (ref 22–32)
Calcium: 9.1 mg/dL (ref 8.9–10.3)
Chloride: 98 mmol/L (ref 98–111)
Creatinine, Ser: 0.76 mg/dL (ref 0.44–1.00)
GFR, Estimated: >60 mL/min (ref >60)
Glucose, Bld: 121 mg/dL — ABNORMAL HIGH (ref 70–99)
Potassium: 3.5 mmol/L (ref 3.5–5.1)
Sodium: 136 mmol/L (ref 135–145)
Total Bilirubin: 0.6 mg/dL (ref 0.3–1.2)
Total Protein: 7.4 g/dL (ref 6.5–8.1)

## 2020-08-28 MED ORDER — PALONOSETRON HCL INJECTION 0.25 MG/5ML
0.2500 mg | Freq: Once | INTRAVENOUS | Status: AC
  Administered 2020-08-28: 0.25 mg via INTRAVENOUS
  Filled 2020-08-28: qty 5

## 2020-08-28 MED ORDER — BEVACIZUMAB-AWWB CHEMO INJECTION 400 MG/16ML
5.0000 mg/kg | Freq: Once | INTRAVENOUS | Status: AC
Start: 2020-08-28 — End: 2020-08-28
  Administered 2020-08-28: 300 mg via INTRAVENOUS
  Filled 2020-08-28: qty 12

## 2020-08-28 MED ORDER — DEXTROSE 5 % IV SOLN: Freq: Once | INTRAVENOUS | Status: AC

## 2020-08-28 MED ORDER — SODIUM CHLORIDE 0.9% FLUSH: 10.0000 mL | INTRAVENOUS | Status: DC | PRN

## 2020-08-28 MED ORDER — SODIUM CHLORIDE 0.9 % IV SOLN
10.0000 mg | Freq: Once | INTRAVENOUS | Status: AC
  Administered 2020-08-28: 10 mg via INTRAVENOUS
  Filled 2020-08-28: qty 10

## 2020-08-28 MED ORDER — DEXTROSE 5 % IV SOLN
Freq: Once | INTRAVENOUS | Status: AC
Start: 2020-08-28 — End: 2020-08-28

## 2020-08-28 MED ORDER — HEPARIN SOD (PORK) LOCK FLUSH 100 UNIT/ML IV SOLN: 500.0000 [IU] | Freq: Once | INTRAVENOUS | Status: DC | PRN

## 2020-08-28 MED ORDER — FLUOROURACIL CHEMO INJECTION 2.5 GM/50ML
400.0000 mg/m2 | Freq: Once | INTRAVENOUS | Status: AC
  Administered 2020-08-28: 650 mg via INTRAVENOUS
  Filled 2020-08-28: qty 13

## 2020-08-28 MED ORDER — LEUCOVORIN CALCIUM INJECTION 350 MG
400.0000 mg/m2 | Freq: Once | INTRAVENOUS | Status: AC
  Administered 2020-08-28: 644 mg via INTRAVENOUS
  Filled 2020-08-28: qty 32.2

## 2020-08-28 MED ORDER — OXALIPLATIN CHEMO INJECTION 100 MG/20ML
68.0000 mg/m2 | Freq: Once | INTRAVENOUS | Status: AC
  Administered 2020-08-28: 110 mg via INTRAVENOUS
  Filled 2020-08-28: qty 20

## 2020-08-28 MED ORDER — SODIUM CHLORIDE 0.9 % IV SOLN
2400.0000 mg/m2 | INTRAVENOUS | Status: DC
  Administered 2020-08-28: 3850 mg via INTRAVENOUS
  Filled 2020-08-28: qty 77

## 2020-08-28 MED ORDER — SODIUM CHLORIDE 0.9 % IV SOLN: Freq: Once | INTRAVENOUS | Status: AC

## 2020-08-28 NOTE — Patient Instructions (Signed)
[facility] Discharge Instructions for Patients Receiving Chemotherapy  Today you received the following chemotherapy agents   To help prevent nausea and vomiting after your treatment, we encourage you to take your nausea medication   If you develop nausea and vomiting that is not controlled by your nausea medication, call the clinic.   BELOW ARE SYMPTOMS THAT SHOULD BE REPORTED IMMEDIATELY:  *FEVER GREATER THAN 100.5 F  *CHILLS WITH OR WITHOUT FEVER  NAUSEA AND VOMITING THAT IS NOT CONTROLLED WITH YOUR NAUSEA MEDICATION  *UNUSUAL SHORTNESS OF BREATH  *UNUSUAL BRUISING OR BLEEDING  TENDERNESS IN MOUTH AND THROAT WITH OR WITHOUT PRESENCE OF ULCERS  *URINARY PROBLEMS  *BOWEL PROBLEMS  UNUSUAL RASH Items with * indicate a potential emergency and should be followed up as soon as possible.  Feel free to call the clinic should you have any questions or concerns. The clinic phone number is [phone].  Please show the CHEMO ALERT CARD at check-in to the Emergency Department and triage nurse.   

## 2020-08-28 NOTE — Progress Notes (Signed)
[place], [place] [address]   CLINIC:  Medical Oncology/Hematology  PCP:  Patient, No Pcp Per None None   REASON FOR VISIT:  Follow-up for metastatic colon cancer to liver  PRIOR THERAPY: Sigmoid colectomy and end colostomy on 04/29/2020  NGS Results: Foundation 1 KRAS wildtype, MS--stable, TMB 4 Muts/Mb  CURRENT THERAPY: FOLFOX, Avastin & Aloxi every 2 weeks  BRIEF ONCOLOGIC HISTORY:  Oncology History  Metastatic colon cancer to liver ([place])  05/16/2020 Initial Diagnosis   Metastatic colon cancer to liver ([facility])   06/03/2020 Genetic Testing   Foundation One:     06/04/2020 -  Chemotherapy    Patient is on Treatment Plan: COLORECTAL FOLFOX + BEVACIZUMAB Q14D        CANCER STAGING: Cancer Staging No matching staging information was found for the patient.  INTERVAL HISTORY:  [name], a 60 y.o. female, returns for routine follow-up and consideration for next cycle of chemotherapy. Susan Martin was last seen on 07/31/2020.  Due for cycle #7 of FOLFOX, Avastin and Aloxi today.   Overall, she tells me she has been feeling pretty well. She reports having mouth sores after the previous treatment which lasted for about 1 week, but denies having N/V/D, nosebleeds, hematochezia or hematuria. She has numbness and cold sensitivity which resolves after 1 week after chemo. Her appetite is excellent.  Overall, she feels ready for next cycle of chemo today.    REVIEW OF SYSTEMS:  Review of Systems  Constitutional: Positive for fatigue (90%). Negative for appetite change.  HENT:   Positive for mouth sores (for 1 week after chemo). Negative for nosebleeds.   Gastrointestinal: Negative for blood in stool, diarrhea, nausea and vomiting.  Genitourinary: Negative for hematuria.   Neurological: Positive for numbness (cold sensitivity & burning after chemo).  All other systems reviewed and are negative.   PAST MEDICAL/SURGICAL HISTORY:   Past Medical History:  Diagnosis Date  . 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 Delton Coombes   04-29-2020 [facility] surgery for perforated colon s/p sigmoid colectomy w/ colostomy; dx  Stage IV colon cancer mets to liver  . Anxiety   . Depression   . Hypertension    followed by dr Glo Herring and oncology  (05-27-2020 per pt does not have pcp yet)  . IDA (iron deficiency anemia)   . Pulmonary nodule, right    Past Surgical History:  Procedure Laterality Date  . ABDOMINAL HYSTERECTOMY  03-31-2016   @[facility]    W/  BILATERAL SALPINOOPHORECTOMY   . APPLICATION OF WOUND VAC N/A 04/29/2020   Procedure: APPLICATION OF WOUND VAC;  Surgeon: Kinsinger, Arta Bruce, MD;  Location: [facility];  Service: General;  Laterality: N/A;  . ENDOMETRIAL ABLATION    . LAPAROTOMY N/A 04/29/2020   Procedure: EXPLORATORY LAPAROTOMY;  Surgeon: Kieth Brightly Arta Bruce, MD;  Location: [facility];  Service: General;  Laterality: N/A;  . PARTIAL COLECTOMY N/A 04/29/2020   Procedure: PARTIAL COLECTOMY WITH END COLOSTOMY;  Surgeon: Kieth Brightly Arta Bruce, MD;  Location: [facility];  Service: General;  Laterality: N/A;  . PORTACATH PLACEMENT Right 05/28/2020   Procedure: INSERTION PORT-A-CATH;  Surgeon: Mickeal Skinner, MD;  Location: [facility];  Service: General;  Laterality: Right;  . SALPINGOOPHORECTOMY Left 03/31/2016   Procedure: LEFT SALPINGO OOPHORECTOMY WITH FROZEN SECTION,  ABDOMINAL HYSTERECTOMY WITH BILATERAL SALPINGO-OOPHORECTOMY;  Surgeon: Jonnie Kind, MD;  Location: [facility];  Service: Gynecology;  Laterality: Left;    SOCIAL HISTORY:  Social History   Socioeconomic History  . Marital status: Divorced    Spouse name: Not on file  . Number of children: Not on file  . Years of education: Not on file  . Highest education level: Not on file  Occupational History  . Not on file  Tobacco Use  . Smoking status: Former Smoker    Packs/day: 0.50    Years: 5.00    Pack years:  2.50    Types: Cigarettes    Quit date: 03/28/2015    Years since quitting: 5.4  . Smokeless tobacco: Never Used  Vaping Use  . Vaping Use: Never used  Substance and Sexual Activity  . Alcohol use: No  . Drug use: Never  . Sexual activity: Yes    Partners: Male    Birth control/protection: Surgical  Other Topics Concern  . Not on file  Social History Narrative  . Not on file   Social Determinants of Health   Financial Resource Strain: Low Risk   . Difficulty of Paying Living Expenses: Not very hard  Food Insecurity: No Food Insecurity  . Worried About Charity fundraiser in the Last Year: Never true  . Ran Out of Food in the Last Year: Never true  Transportation Needs: No Transportation Needs  . Lack of Transportation (Medical): No  . Lack of Transportation (Non-Medical): No  Physical Activity: Insufficiently Active  . Days of Exercise per Week: 2 days  . Minutes of Exercise per Session: 30 min  Stress: Stress Concern Present  . Feeling of Stress : To some extent  Social Connections: Moderately Integrated  . Frequency of Communication with Friends and Family: More than three times a week  . Frequency of Social Gatherings with Friends and Family: Once a week  . Attends Religious Services: More than 4 times per year  . Active Member of Clubs or Organizations: No  . Attends Archivist Meetings: Never  . Marital Status: Living with partner  Intimate Partner Violence: Not At Risk  . Fear of Current or Ex-Partner: No  . Emotionally Abused: No  . Physically Abused: No  . Sexually Abused: No    FAMILY HISTORY:  Family History  Problem Relation Age of Onset  . Hypertension Mother   . Diabetes Mother   . Cirrhosis Father     CURRENT MEDICATIONS:  Current Outpatient Medications  Medication Sig Dispense Refill  . BEVACIZUMAB IV Inject 5 mg/kg into the vein every 14 (fourteen) days.    . fluorouracil CALGB 69678 in sodium chloride 0.9 % 150 mL Inject 2,400 mg/m2  into the vein over 48 hr.    . hydrOXYzine (VISTARIL) 25 MG capsule Take 1 capsule (25 mg total) by mouth 3 (three) times daily as needed. Prn anxiety 60 capsule 2  . LEUCOVORIN CALCIUM IV Inject 400 mg/m2 into the vein every 21 ( twenty-one) days.    Marland Kitchen lidocaine-prilocaine (EMLA) cream Apply small amount to port a cath site and cover with plastic wrap 1 hour prior to chemotherapy appointments 30 g 3  . OXALIPLATIN IV Inject 85 mg/m2 into the vein every 14 (fourteen) days.    Marland Kitchen oxyCODONE (OXY IR/ROXICODONE) 5 MG immediate release tablet TAKE 1 TABLET BY MOUTH EVERY 6 HOURS AS NEEDED FOR MODERATE PAIN. 120 tablet 0  . PARoxetine (PAXIL) 40 MG tablet Take 1 tablet (40 mg total) by mouth every morning. 90 tablet 3  . polyethylene glycol (MIRALAX / GLYCOLAX) packet Take 17 g by mouth every  other day.    . prochlorperazine (COMPAZINE) 10 MG tablet Take 1 tablet (10 mg total) by mouth every 6 (six) hours as needed (Nausea or vomiting). 30 tablet 1  . triamterene-hydrochlorothiazide (MAXZIDE-25) 37.5-25 MG tablet Take 1 tablet by mouth daily.     No current facility-administered medications for this visit.    ALLERGIES:  No Known Allergies  PHYSICAL EXAM:  Performance status (ECOG): 1 - Symptomatic but completely ambulatory  Vitals:   08/28/20 0814  BP: (!) 165/85  Pulse: 83  Resp: 18  Temp: (!) 96.6 F (35.9 C)  SpO2: 96%   Wt Readings from Last 3 Encounters:  08/28/20 138 lb 12.8 oz (63 kg)  08/16/20 139 lb 15.9 oz (63.5 kg)  08/14/20 139 lb 3.2 oz (63.1 kg)   Physical Exam Vitals reviewed.  Constitutional:      Appearance: Normal appearance.  Cardiovascular:     Rate and Rhythm: Normal rate and regular rhythm.     Pulses: Normal pulses.     Heart sounds: Normal heart sounds.  Pulmonary:     Effort: Pulmonary effort is normal.     Breath sounds: Normal breath sounds.  Chest:     Comments: Port-a-Cath in R chest Abdominal:     Palpations: Abdomen is soft. There is no mass.      Tenderness: There is no abdominal tenderness.     Hernia: No hernia is present.     Comments: Colostomy bag  Musculoskeletal:     Right lower leg: No edema.     Left lower leg: No edema.  Neurological:     General: No focal deficit present.     Mental Status: She is alert and oriented to person, place, and time.  Psychiatric:        Mood and Affect: Mood normal.        Behavior: Behavior normal.     LABORATORY DATA:  I have reviewed the labs as listed.  CBC Latest Ref Rng & Units 08/28/2020 08/14/2020 07/31/2020  WBC 4.0 - 10.5 K/uL 10.6(H) 9.2 20.0(H)  Hemoglobin 12.0 - 15.0 g/dL 13.6 12.5 13.0  Hematocrit 36.0 - 46.0 % 42.1 39.1 39.3  Platelets 150 - 400 K/uL 182 196 190   CMP Latest Ref Rng & Units 08/28/2020 08/14/2020 07/31/2020  Glucose 70 - 99 mg/dL 121(H) 133(H) 138(H)  BUN 6 - 20 mg/dL 10 9 11   Creatinine 0.44 - 1.00 mg/dL 0.76 0.78 0.74  Sodium 135 - 145 mmol/L 136 135 130(L)  Potassium 3.5 - 5.1 mmol/L 3.5 3.6 3.4(L)  Chloride 98 - 111 mmol/L 98 101 91(L)  CO2 22 - 32 mmol/L 27 27 29   Calcium 8.9 - 10.3 mg/dL 9.1 8.9 8.9  Total Protein 6.5 - 8.1 g/dL 7.4 6.7 7.9  Total Bilirubin 0.3 - 1.2 mg/dL 0.6 0.4 0.5  Alkaline Phos 38 - 126 U/L 149(H) 138(H) 161(H)  AST 15 - 41 U/L 33 32 20  ALT 0 - 44 U/L 26 28 15     DIAGNOSTIC IMAGING:  I have independently reviewed the scans and discussed with the patient. No results found.   ASSESSMENT:  1. Metastatic colon adenocarcinoma to liver: -Sigmoid colectomy and end colostomy on 04/29/2020 -Pathology pT4a,N1C (1 tumor deposit), 0/8 lymph nodes involved -MMR proficient, MSI-stable -Liver biopsy on May 03, 2020-adenocarcinoma consistent with colon primary. -CT chest on 05/02/2020 with no evidence of pulmonary metastatic disease. -CTAP on 05/07/2020 with multiple lesions throughout the liver, largest in the right lobe measuring 3.9 cm,  lateral segment left lobe measuring 2.6 cm and inferior right lobe confluent lesion 4.2 x  3.7 cm. No lymphadenopathy. -CEA on 05/02/2020-60.2. -FOLFOX and bevacizumab started on 06/04/2020. -Foundation 1 testing shows MS-stable, KRAS/NRAS wild-type  2. Iron deficiency anemia: -Feraheme on 04/30/2020 and 05/06/2020.  3. 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 -Smoked for 3 to 4 years and quit. -Mother had cancer, type unknown. Maternal uncle had lung cancer. Maternal aunt had lung cancer.   PLAN:  1. Metastatic colon adenocarcinoma to liver, MS-stable: -CEA today improved to 3.2. -We reviewed the CT CAP from 08/27/2020.  Liver lesions have both decreased in size and number.  Small stable lung lesions indicating benign etiology.  No new areas of concern. -Reviewed her labs today.  Alk phos is 149 and rest of the LFTs are normal.  White count and platelet count is adequate to proceed with her next treatment.  She has not developed any neuropathy at this time.  Plan to continue oxaliplatin for 9-12 cycles if there is no development of neuropathy. -She had mouth sores for 1 week last time and cold sensitivity which lasted about a week.  We will see her back in 2 weeks for follow-up.  2. Iron deficiency anemia: -Parenteral iron therapy not needed.  Hemoglobin normal.  3. Abdominal pain: -Continue Percocet as needed which is helping.  4.  Peripheral neuropathy: -Cold sensitivity has lasted about 1 week.  Denies any tingling or numbness.  Continue oxaliplatin at 20% dose reduction.  5.  Anxiety: -Continue hydroxyzine 25 mg 3 times daily as needed.   Orders placed this encounter:  Orders Placed This Encounter  Procedures  . CBC with Differential/Platelet     Derek Jack, MD [place] [phone]   I, Milinda Antis, am acting as a scribe for Dr. Sanda Linger.  I, Derek Jack MD, have reviewed the above documentation for accuracy and completeness, and I agree with the above.

## 2020-08-28 NOTE — Patient Instructions (Signed)
[facility] Discharge Instructions  You were seen today by Dr. Katragadda. He went over your recent results and scans. You received your treatment today; continue getting your treatment every 2 weeks. Dr. Katragadda will see you back in 4 weeks for labs and follow up.   Thank you for choosing [facility] at [facility] to provide your oncology and hematology care.  To afford each patient quality time with our provider, please arrive at least 15 minutes before your scheduled appointment time.   If you have a lab appointment with the Cancer Center please come in thru the Main Entrance and check in at the main information desk  You need to re-schedule your appointment should you arrive 10 or more minutes late.  We strive to give you quality time with our providers, and arriving late affects you and other patients whose appointments are after yours.  Also, if you no show three or more times for appointments you may be dismissed from the clinic at the providers discretion.     Again, thank you for choosing [facility].  Our hope is that these requests will decrease the amount of time that you wait before being seen by our physicians.       _____________________________________________________________  Should you have questions after your visit to [facility], please contact our office at [phone] between the hours of 8:00 a.m. and 4:30 p.m.  Voicemails left after 4:00 p.m. will not be returned until the following business day.  For prescription refill requests, have your pharmacy contact our office and allow 72 hours.    Cancer Center Support Programs:   > Cancer Support Group  2nd Tuesday of the month 1pm-2pm, Journey Room    

## 2020-08-28 NOTE — Progress Notes (Signed)
Pt here for C1D16 of FOLFOX plus avastin. ALK PHOS 149. Okay for treatment today.  Tolerated treatment well today without incidence.  Discharged ambulatory in stable condition with ambulatory 5FU pump. Vital signs stable prior to discharge.

## 2020-08-28 NOTE — Progress Notes (Signed)
Patient was assessed by Dr. Katragadda and labs have been reviewed.  Patient is okay to proceed with treatment today. Primary RN and pharmacy aware.   

## 2020-08-29 LAB — CEA: CEA: 3.6 ng/mL (ref 0.0–4.7)

## 2020-08-30 ENCOUNTER — Inpatient Hospital Stay (HOSPITAL_COMMUNITY): Payer: Medicaid Other

## 2020-08-30 ENCOUNTER — Other Ambulatory Visit: Payer: Self-pay

## 2020-08-30 VITALS — BP 141/80 | HR 84 | Temp 96.9°F | Resp 16

## 2020-08-30 DIAGNOSIS — C189 Malignant neoplasm of colon, unspecified: Secondary | ICD-10-CM

## 2020-08-30 DIAGNOSIS — Z5111 Encounter for antineoplastic chemotherapy: Secondary | ICD-10-CM | POA: Diagnosis not present

## 2020-08-30 DIAGNOSIS — C787 Secondary malignant neoplasm of liver and intrahepatic bile duct: Secondary | ICD-10-CM

## 2020-08-30 MED ORDER — PEGFILGRASTIM-CBQV 6 MG/0.6ML ~~LOC~~ SOSY
6.0000 mg | PREFILLED_SYRINGE | Freq: Once | SUBCUTANEOUS | Status: AC
  Administered 2020-08-30: 6 mg via SUBCUTANEOUS
  Filled 2020-08-30: qty 0.6

## 2020-08-30 MED ORDER — SODIUM CHLORIDE 0.9% FLUSH
10.0000 mL | INTRAVENOUS | Status: DC | PRN
  Administered 2020-08-30: 10 mL

## 2020-08-30 MED ORDER — HEPARIN SOD (PORK) LOCK FLUSH 100 UNIT/ML IV SOLN
500.0000 [IU] | Freq: Once | INTRAVENOUS | Status: AC | PRN
Start: 2020-08-30 — End: 2020-08-30
  Administered 2020-08-30: 500 [IU]

## 2020-08-30 NOTE — Patient Instructions (Signed)
[facility] Discharge Instructions for Patients Receiving Chemotherapy  Today you received the following chemotherapy agents   To help prevent nausea and vomiting after your treatment, we encourage you to take your nausea medication   If you develop nausea and vomiting that is not controlled by your nausea medication, call the clinic.   BELOW ARE SYMPTOMS THAT SHOULD BE REPORTED IMMEDIATELY:  *FEVER GREATER THAN 100.5 F  *CHILLS WITH OR WITHOUT FEVER  NAUSEA AND VOMITING THAT IS NOT CONTROLLED WITH YOUR NAUSEA MEDICATION  *UNUSUAL SHORTNESS OF BREATH  *UNUSUAL BRUISING OR BLEEDING  TENDERNESS IN MOUTH AND THROAT WITH OR WITHOUT PRESENCE OF ULCERS  *URINARY PROBLEMS  *BOWEL PROBLEMS  UNUSUAL RASH Items with * indicate a potential emergency and should be followed up as soon as possible.  Feel free to call the clinic should you have any questions or concerns. The clinic phone number is [phone].  Please show the CHEMO ALERT CARD at check-in to the Emergency Department and triage nurse.   

## 2020-08-30 NOTE — Progress Notes (Signed)
Patient presents today for 5FU pump disconnection and Udenacya injection.  Vital signs WNL.  Patient has no new complaints since last visit.  5FU pump disconnected and Udenacya injection given today per MD orders.  Stable during disconnection and injection without adverse affects.  Injection site WNL.  Vital signs stable.  No complaints at this time.  Discharge from clinic ambulatory in stable condition.  Alert and oriented X 3.  Follow up with [facility] as scheduled.

## 2020-09-11 ENCOUNTER — Inpatient Hospital Stay (HOSPITAL_COMMUNITY): Payer: Medicaid Other

## 2020-09-11 ENCOUNTER — Other Ambulatory Visit: Payer: Self-pay

## 2020-09-11 ENCOUNTER — Encounter (HOSPITAL_COMMUNITY): Payer: Self-pay

## 2020-09-11 VITALS — BP 155/90 | HR 77 | Temp 98.8°F | Resp 18

## 2020-09-11 DIAGNOSIS — C189 Malignant neoplasm of colon, unspecified: Secondary | ICD-10-CM

## 2020-09-11 DIAGNOSIS — C787 Secondary malignant neoplasm of liver and intrahepatic bile duct: Secondary | ICD-10-CM

## 2020-09-11 DIAGNOSIS — Z5111 Encounter for antineoplastic chemotherapy: Secondary | ICD-10-CM | POA: Diagnosis not present

## 2020-09-11 LAB — COMPREHENSIVE METABOLIC PANEL
ALT: 32 U/L (ref 0–44)
AST: 35 U/L (ref 15–41)
Albumin: 3.6 g/dL (ref 3.5–5.0)
Alkaline Phosphatase: 155 U/L — ABNORMAL HIGH (ref 38–126)
Anion gap: 8 (ref 5–15)
BUN: 19 mg/dL (ref 6–20)
CO2: 28 mmol/L (ref 22–32)
Calcium: 9.2 mg/dL (ref 8.9–10.3)
Chloride: 102 mmol/L (ref 98–111)
Creatinine, Ser: 0.65 mg/dL (ref 0.44–1.00)
GFR, Estimated: >60 mL/min (ref >60)
Glucose, Bld: 108 mg/dL — ABNORMAL HIGH (ref 70–99)
Potassium: 3.7 mmol/L (ref 3.5–5.1)
Sodium: 138 mmol/L (ref 135–145)
Total Bilirubin: 0.5 mg/dL (ref 0.3–1.2)
Total Protein: 7.3 g/dL (ref 6.5–8.1)

## 2020-09-11 LAB — URINALYSIS, DIPSTICK ONLY
Bilirubin Urine: NEGATIVE
Glucose, UA: NEGATIVE mg/dL
Hgb urine dipstick: NEGATIVE
Ketones, ur: NEGATIVE mg/dL
Nitrite: NEGATIVE
Protein, ur: NEGATIVE mg/dL
Specific Gravity, Urine: 1.014 (ref 1.005–1.030)
pH: 7 (ref 5.0–8.0)

## 2020-09-11 LAB — CBC WITH DIFFERENTIAL/PLATELET
Abs Immature Granulocytes: 0.15 10*3/uL — ABNORMAL HIGH (ref 0.00–0.07)
Basophils Absolute: 0.1 10*3/uL (ref 0.0–0.1)
Basophils Relative: 1 %
Eosinophils Absolute: 0.3 10*3/uL (ref 0.0–0.5)
Eosinophils Relative: 2 %
HCT: 39.4 % (ref 36.0–46.0)
Hemoglobin: 13 g/dL (ref 12.0–15.0)
Immature Granulocytes: 1 %
Lymphocytes Relative: 27 %
Lymphs Abs: 3.6 10*3/uL (ref 0.7–4.0)
MCH: 30.4 pg (ref 26.0–34.0)
MCHC: 33 g/dL (ref 30.0–36.0)
MCV: 92.3 fL (ref 80.0–100.0)
Monocytes Absolute: 1.2 10*3/uL — ABNORMAL HIGH (ref 0.1–1.0)
Monocytes Relative: 9 %
Neutro Abs: 8.3 10*3/uL — ABNORMAL HIGH (ref 1.7–7.7)
Neutrophils Relative %: 60 %
Platelets: 149 10*3/uL — ABNORMAL LOW (ref 150–400)
RBC: 4.27 MIL/uL (ref 3.87–5.11)
RDW: 19.3 % — ABNORMAL HIGH (ref 11.5–15.5)
WBC: 13.6 10*3/uL — ABNORMAL HIGH (ref 4.0–10.5)
nRBC: 0 % (ref 0.0–0.2)

## 2020-09-11 LAB — MAGNESIUM: Magnesium: 2 mg/dL (ref 1.7–2.4)

## 2020-09-11 MED ORDER — SODIUM CHLORIDE 0.9 % IV SOLN
2400.0000 mg/m2 | INTRAVENOUS | Status: DC
  Administered 2020-09-11: 3850 mg via INTRAVENOUS
  Filled 2020-09-11: qty 77

## 2020-09-11 MED ORDER — SODIUM CHLORIDE 0.9 % IV SOLN
10.0000 mg | Freq: Once | INTRAVENOUS | Status: AC
  Administered 2020-09-11: 10 mg via INTRAVENOUS
  Filled 2020-09-11: qty 10

## 2020-09-11 MED ORDER — OXALIPLATIN CHEMO INJECTION 100 MG/20ML
68.0000 mg/m2 | Freq: Once | INTRAVENOUS | Status: AC
  Administered 2020-09-11: 110 mg via INTRAVENOUS
  Filled 2020-09-11: qty 20

## 2020-09-11 MED ORDER — PALONOSETRON HCL INJECTION 0.25 MG/5ML
0.2500 mg | Freq: Once | INTRAVENOUS | Status: AC
  Administered 2020-09-11: 0.25 mg via INTRAVENOUS

## 2020-09-11 MED ORDER — LEUCOVORIN CALCIUM INJECTION 350 MG
400.0000 mg/m2 | Freq: Once | INTRAVENOUS | Status: AC
  Administered 2020-09-11: 644 mg via INTRAVENOUS
  Filled 2020-09-11: qty 32.2

## 2020-09-11 MED ORDER — DEXTROSE 5 % IV SOLN: Freq: Once | INTRAVENOUS | Status: AC

## 2020-09-11 MED ORDER — SODIUM CHLORIDE 0.9 % IV SOLN
5.0000 mg/kg | Freq: Once | INTRAVENOUS | Status: AC
  Administered 2020-09-11: 300 mg via INTRAVENOUS
  Filled 2020-09-11: qty 12

## 2020-09-11 MED ORDER — PALONOSETRON HCL INJECTION 0.25 MG/5ML
INTRAVENOUS | Status: AC
  Filled 2020-09-11: qty 5

## 2020-09-11 MED ORDER — SODIUM CHLORIDE 0.9 % IV SOLN: Freq: Once | INTRAVENOUS | Status: AC

## 2020-09-11 MED ORDER — FLUOROURACIL CHEMO INJECTION 2.5 GM/50ML
400.0000 mg/m2 | Freq: Once | INTRAVENOUS | Status: AC
  Administered 2020-09-11: 650 mg via INTRAVENOUS
  Filled 2020-09-11: qty 13

## 2020-09-11 NOTE — Patient Instructions (Signed)
[facility] Discharge Instructions for Patients Receiving Chemotherapy  Today you received the following chemotherapy agents Avastin, and Folfox.   To help prevent nausea and vomiting after your treatment, we encourage you to take your nausea medication.    If you develop nausea and vomiting that is not controlled by your nausea medication, call the clinic.   BELOW ARE SYMPTOMS THAT SHOULD BE REPORTED IMMEDIATELY:  *FEVER GREATER THAN 100.5 F  *CHILLS WITH OR WITHOUT FEVER  NAUSEA AND VOMITING THAT IS NOT CONTROLLED WITH YOUR NAUSEA MEDICATION  *UNUSUAL SHORTNESS OF BREATH  *UNUSUAL BRUISING OR BLEEDING  TENDERNESS IN MOUTH AND THROAT WITH OR WITHOUT PRESENCE OF ULCERS  *URINARY PROBLEMS  *BOWEL PROBLEMS  UNUSUAL RASH Items with * indicate a potential emergency and should be followed up as soon as possible.  Feel free to call the clinic should you have any questions or concerns. The clinic phone number is [phone].  Please show the [place] at check-in to the Emergency Department and triage nurse.

## 2020-09-11 NOTE — Progress Notes (Signed)
Patient presents today for treatment. Labs pending. Vital signs within parameters for treatment today. Patient denies any significant changes since her last appointment. Patient denies pain today. Patient states , " I have some cold sensitivity after treatment on my fingers then it goes away about the 3 rd day after treatment." MAR reviewed and updated.   Labs within parameters for treatment.   Treatment given today per MD orders. Tolerated infusion without adverse affects. Vital signs stable. No complaints at this time. RUN noted on screen. Verified with patient. Discharged from clinic ambulatory in stable condition. Alert and oriented x 3. F/U with [facility] as scheduled.

## 2020-09-13 ENCOUNTER — Inpatient Hospital Stay (HOSPITAL_COMMUNITY): Payer: Medicaid Other

## 2020-09-13 ENCOUNTER — Other Ambulatory Visit: Payer: Self-pay

## 2020-09-13 VITALS — BP 127/67 | HR 78 | Temp 96.8°F | Resp 17

## 2020-09-13 DIAGNOSIS — Z5111 Encounter for antineoplastic chemotherapy: Secondary | ICD-10-CM | POA: Diagnosis not present

## 2020-09-13 DIAGNOSIS — C787 Secondary malignant neoplasm of liver and intrahepatic bile duct: Secondary | ICD-10-CM

## 2020-09-13 DIAGNOSIS — C189 Malignant neoplasm of colon, unspecified: Secondary | ICD-10-CM

## 2020-09-13 MED ORDER — SODIUM CHLORIDE 0.9% FLUSH
10.0000 mL | Freq: Once | INTRAVENOUS | Status: AC
  Administered 2020-09-13: 10 mL via INTRAVENOUS

## 2020-09-13 MED ORDER — PEGFILGRASTIM-CBQV 6 MG/0.6ML ~~LOC~~ SOSY
PREFILLED_SYRINGE | SUBCUTANEOUS | Status: AC
  Filled 2020-09-13: qty 0.6

## 2020-09-13 MED ORDER — PEGFILGRASTIM-CBQV 6 MG/0.6ML ~~LOC~~ SOSY
6.0000 mg | PREFILLED_SYRINGE | Freq: Once | SUBCUTANEOUS | Status: AC
  Administered 2020-09-13: 6 mg via SUBCUTANEOUS

## 2020-09-13 MED ORDER — HEPARIN SOD (PORK) LOCK FLUSH 100 UNIT/ML IV SOLN
500.0000 [IU] | Freq: Once | INTRAVENOUS | Status: AC
  Administered 2020-09-13: 500 [IU] via INTRAVENOUS

## 2020-09-13 NOTE — Progress Notes (Signed)
Patients port flushed without difficulty.  Good blood return noted with no bruising or swelling noted at site.  Band aid applied.  VSS with discharge and left in satisfactory condition with no s/s of distress noted.   Patient tolerated Udenyca injection with no complaints voiced.  Site clean and dry with no bruising or swelling noted.  No complaints of pain.  Discharged with vital signs stable and no signs or symptoms of distress noted.

## 2020-09-25 ENCOUNTER — Inpatient Hospital Stay (HOSPITAL_COMMUNITY): Payer: Medicaid Other

## 2020-09-25 ENCOUNTER — Other Ambulatory Visit: Payer: Self-pay

## 2020-09-25 ENCOUNTER — Inpatient Hospital Stay (HOSPITAL_BASED_OUTPATIENT_CLINIC_OR_DEPARTMENT_OTHER): Payer: Medicaid Other | Admitting: Hematology

## 2020-09-25 ENCOUNTER — Ambulatory Visit (HOSPITAL_COMMUNITY): Payer: Self-pay

## 2020-09-25 VITALS — BP 151/76 | HR 75 | Temp 98.0°F | Resp 18

## 2020-09-25 VITALS — BP 145/77 | HR 81 | Temp 96.8°F | Resp 18 | Wt 145.4 lb

## 2020-09-25 DIAGNOSIS — Z5111 Encounter for antineoplastic chemotherapy: Secondary | ICD-10-CM | POA: Diagnosis not present

## 2020-09-25 DIAGNOSIS — C189 Malignant neoplasm of colon, unspecified: Secondary | ICD-10-CM

## 2020-09-25 DIAGNOSIS — C787 Secondary malignant neoplasm of liver and intrahepatic bile duct: Secondary | ICD-10-CM

## 2020-09-25 LAB — CBC WITH DIFFERENTIAL/PLATELET
Abs Immature Granulocytes: 0.04 10*3/uL (ref 0.00–0.07)
Basophils Absolute: 0 10*3/uL (ref 0.0–0.1)
Basophils Relative: 1 %
Eosinophils Absolute: 0.2 10*3/uL (ref 0.0–0.5)
Eosinophils Relative: 2 %
HCT: 40.2 % (ref 36.0–46.0)
Hemoglobin: 13.2 g/dL (ref 12.0–15.0)
Immature Granulocytes: 1 %
Lymphocytes Relative: 29 %
Lymphs Abs: 2.6 10*3/uL (ref 0.7–4.0)
MCH: 31.2 pg (ref 26.0–34.0)
MCHC: 32.8 g/dL (ref 30.0–36.0)
MCV: 95 fL (ref 80.0–100.0)
Monocytes Absolute: 0.7 10*3/uL (ref 0.1–1.0)
Monocytes Relative: 8 %
Neutro Abs: 5.3 10*3/uL (ref 1.7–7.7)
Neutrophils Relative %: 59 %
Platelets: 167 10*3/uL (ref 150–400)
RBC: 4.23 MIL/uL (ref 3.87–5.11)
RDW: 19.9 % — ABNORMAL HIGH (ref 11.5–15.5)
WBC: 8.8 10*3/uL (ref 4.0–10.5)
nRBC: 0 % (ref 0.0–0.2)

## 2020-09-25 LAB — COMPREHENSIVE METABOLIC PANEL
ALT: 28 U/L (ref 0–44)
AST: 31 U/L (ref 15–41)
Albumin: 3.6 g/dL (ref 3.5–5.0)
Alkaline Phosphatase: 144 U/L — ABNORMAL HIGH (ref 38–126)
Anion gap: 9 (ref 5–15)
BUN: 17 mg/dL (ref 6–20)
CO2: 28 mmol/L (ref 22–32)
Calcium: 9.6 mg/dL (ref 8.9–10.3)
Chloride: 98 mmol/L (ref 98–111)
Creatinine, Ser: 0.71 mg/dL (ref 0.44–1.00)
GFR, Estimated: >60 mL/min (ref >60)
Glucose, Bld: 139 mg/dL — ABNORMAL HIGH (ref 70–99)
Potassium: 3.7 mmol/L (ref 3.5–5.1)
Sodium: 135 mmol/L (ref 135–145)
Total Bilirubin: 0.6 mg/dL (ref 0.3–1.2)
Total Protein: 7.3 g/dL (ref 6.5–8.1)

## 2020-09-25 MED ORDER — SODIUM CHLORIDE 0.9 % IV SOLN
10.0000 mg | Freq: Once | INTRAVENOUS | Status: AC
  Administered 2020-09-25: 10 mg via INTRAVENOUS
  Filled 2020-09-25: qty 10

## 2020-09-25 MED ORDER — SODIUM CHLORIDE 0.9 % IV SOLN: Freq: Once | INTRAVENOUS | Status: AC

## 2020-09-25 MED ORDER — SODIUM CHLORIDE 0.9 % IV SOLN
5.0000 mg/kg | Freq: Once | INTRAVENOUS | Status: AC
  Administered 2020-09-25: 300 mg via INTRAVENOUS
  Filled 2020-09-25: qty 12

## 2020-09-25 MED ORDER — PALONOSETRON HCL INJECTION 0.25 MG/5ML
INTRAVENOUS | Status: AC
  Filled 2020-09-25: qty 5

## 2020-09-25 MED ORDER — FLUOROURACIL CHEMO INJECTION 2.5 GM/50ML
400.0000 mg/m2 | Freq: Once | INTRAVENOUS | Status: AC
  Administered 2020-09-25: 650 mg via INTRAVENOUS
  Filled 2020-09-25: qty 13

## 2020-09-25 MED ORDER — SODIUM CHLORIDE 0.9% FLUSH: 10.0000 mL | INTRAVENOUS | Status: DC | PRN

## 2020-09-25 MED ORDER — DEXTROSE 5 % IV SOLN: Freq: Once | INTRAVENOUS | Status: AC

## 2020-09-25 MED ORDER — LEUCOVORIN CALCIUM INJECTION 350 MG
400.0000 mg/m2 | Freq: Once | INTRAVENOUS | Status: AC
  Administered 2020-09-25: 644 mg via INTRAVENOUS
  Filled 2020-09-25: qty 32.2

## 2020-09-25 MED ORDER — PALONOSETRON HCL INJECTION 0.25 MG/5ML
0.2500 mg | Freq: Once | INTRAVENOUS | Status: AC
  Administered 2020-09-25: 0.25 mg via INTRAVENOUS

## 2020-09-25 MED ORDER — HEPARIN SOD (PORK) LOCK FLUSH 100 UNIT/ML IV SOLN: 500.0000 [IU] | Freq: Once | INTRAVENOUS | Status: DC | PRN

## 2020-09-25 MED ORDER — SODIUM CHLORIDE 0.9 % IV SOLN
2400.0000 mg/m2 | INTRAVENOUS | Status: DC
  Administered 2020-09-25: 3850 mg via INTRAVENOUS
  Filled 2020-09-25: qty 77

## 2020-09-25 MED ORDER — OXALIPLATIN CHEMO INJECTION 100 MG/20ML
68.0000 mg/m2 | Freq: Once | INTRAVENOUS | Status: AC
  Administered 2020-09-25: 110 mg via INTRAVENOUS
  Filled 2020-09-25: qty 20

## 2020-09-25 NOTE — Progress Notes (Signed)
Patient presents today for Avastin/FOLFOX with 5FU pump connection.  Vital signs within parameters for treatment.  Labs pending.  Patient states that she feels good and that she only has nausea for a week after treatment then it resolves.  No other complaints at this time.  Labs within parameters for treatment.  Avastin/FOLFOX infusions with 5FU Pump connection given today per MD orders.  Stable during infusion without adverse affects.  Vital signs stable.  No complaints at this time.  Discharge from clinic ambulatory in stable condition.  Alert and oriented X 3.  Follow up with [facility] as scheduled.

## 2020-09-25 NOTE — Progress Notes (Signed)
Patient was assessed by Dr. Katragadda and labs have been reviewed.  Patient is okay to proceed with treatment today. Primary RN and pharmacy aware.   

## 2020-09-25 NOTE — Progress Notes (Signed)
Susan Martin, [name]   CLINIC:  Medical Oncology/Hematology  PCP:  Patient, No Pcp Per (Inactive) None None   REASON FOR VISIT:  Follow-up for metastatic colon cancer to liver  PRIOR THERAPY: Sigmoid colectomy and end colostomy on 04/29/2020  NGS Results: Foundation 1 KRAS wildtype, MS--stable, TMB 4 Muts/Mb  CURRENT THERAPY: FOLFOX, Avastin & Aloxi every 2 weeks  BRIEF ONCOLOGIC HISTORY:  Oncology History  Metastatic colon cancer to liver ([facility])  05/16/2020 Initial Diagnosis   Metastatic colon cancer to liver ([facility])   06/03/2020 Genetic Testing   Foundation One:     06/04/2020 -  Chemotherapy    Patient is on Treatment Plan: COLORECTAL FOLFOX + BEVACIZUMAB Q14D        CANCER STAGING: Cancer Staging No matching staging information was found for the patient.  INTERVAL HISTORY:  [name], a 60 y.o. female, returns for routine follow-up and consideration for next cycle of chemotherapy. Niurka was last seen on 08/28/2020.  Due for cycle #9 of FOLFOX, Avastin and Aloxi today.   Overall, she tells me she has been feeling pretty well. She reports having soreness in her mouth and occasional tingling and numbness and cold sensitivity in her hands lasting 1 week after chemo. She denies having abdominal pain, diarrhea, constipation, hematochezia, hematuria or dysuria; she has occasional nosebleeds when she blows her nose forcefully. She no longer needs pain meds for her abdominal pain.   Overall, she feels ready for next cycle of chemo today.    REVIEW OF SYSTEMS:  Review of Systems  Constitutional: Positive for fatigue (75%). Negative for appetite change.  HENT:   Positive for nosebleeds (w/ forceful nose blowing).        Mouth soreness lasting 1 week Xerostomia  Gastrointestinal: Negative for abdominal pain, blood in stool, constipation and diarrhea.  Genitourinary: Negative for dysuria and hematuria.    Neurological: Positive for numbness (occasional numbness & cold sensitivity lasting 1 week post Tx).  All other systems reviewed and are negative.   PAST MEDICAL/SURGICAL HISTORY:  Past Medical History:  Diagnosis Date  . 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 Delton Coombes   04-29-2020 [facility] surgery for perforated colon s/p sigmoid colectomy w/ colostomy; dx  Stage IV colon cancer mets to liver  . Anxiety   . Depression   . Hypertension    followed by dr Glo Herring and oncology  (05-27-2020 per pt does not have pcp yet)  . IDA (iron deficiency anemia)   . Pulmonary nodule, right    Past Surgical History:  Procedure Laterality Date  . ABDOMINAL HYSTERECTOMY  03-31-2016   _0    W/  BILATERAL SALPINOOPHORECTOMY   . APPLICATION OF WOUND VAC N/A 04/29/2020   Procedure: APPLICATION OF WOUND VAC;  Surgeon: Kinsinger, Arta Bruce, MD;  Location: [facility];  Service: General;  Laterality: N/A;  . ENDOMETRIAL ABLATION    . LAPAROTOMY N/A 04/29/2020   Procedure: EXPLORATORY LAPAROTOMY;  Surgeon: Kieth Brightly Arta Bruce, MD;  Location: [facility];  Service: General;  Laterality: N/A;  . PARTIAL COLECTOMY N/A 04/29/2020   Procedure: PARTIAL COLECTOMY WITH END COLOSTOMY;  Surgeon: Kieth Brightly Arta Bruce, MD;  Location: [facility];  Service: General;  Laterality: N/A;  . PORTACATH PLACEMENT Right 05/28/2020   Procedure: INSERTION PORT-A-CATH;  Surgeon: Mickeal Skinner, MD;  Location: [facility];  Service: General;  Laterality: Right;  . SALPINGOOPHORECTOMY Left 03/31/2016   Procedure: LEFT SALPINGO OOPHORECTOMY  WITH FROZEN SECTION,  ABDOMINAL HYSTERECTOMY WITH BILATERAL SALPINGO-OOPHORECTOMY;  Surgeon: Jonnie Kind, MD;  Location: [facility];  Service: Gynecology;  Laterality: Left;    SOCIAL HISTORY:  Social History   Socioeconomic History  . Marital status: Divorced    Spouse name: Not on file  . Number of children: Not on file  . Years of education: Not on  file  . Highest education level: Not on file  Occupational History  . Not on file  Tobacco Use  . Smoking status: Former Smoker    Packs/day: 0.50    Years: 5.00    Pack years: 2.50    Types: Cigarettes    Quit date: 03/28/2015    Years since quitting: 5.5  . Smokeless tobacco: Never Used  Vaping Use  . Vaping Use: Never used  Substance and Sexual Activity  . Alcohol use: No  . Drug use: Never  . Sexual activity: Yes    Partners: Male    Birth control/protection: Surgical  Other Topics Concern  . Not on file  Social History Narrative  . Not on file   Social Determinants of Health   Financial Resource Strain: Low Risk   . Difficulty of Paying Living Expenses: Not very hard  Food Insecurity: No Food Insecurity  . Worried About Charity fundraiser in the Last Year: Never true  . Ran Out of Food in the Last Year: Never true  Transportation Needs: No Transportation Needs  . Lack of Transportation (Medical): No  . Lack of Transportation (Non-Medical): No  Physical Activity: Insufficiently Active  . Days of Exercise per Week: 2 days  . Minutes of Exercise per Session: 30 min  Stress: Stress Concern Present  . Feeling of Stress : To some extent  Social Connections: Moderately Integrated  . Frequency of Communication with Friends and Family: More than three times a week  . Frequency of Social Gatherings with Friends and Family: Once a week  . Attends Religious Services: More than 4 times per year  . Active Member of Clubs or Organizations: No  . Attends Archivist Meetings: Never  . Marital Status: Living with partner  Intimate Partner Violence: Not At Risk  . Fear of Current or Ex-Partner: No  . Emotionally Abused: No  . Physically Abused: No  . Sexually Abused: No    FAMILY HISTORY:  Family History  Problem Relation Age of Onset  . Hypertension Mother   . Diabetes Mother   . Cirrhosis Father     CURRENT MEDICATIONS:  Current Outpatient Medications   Medication Sig Dispense Refill  . BEVACIZUMAB IV Inject 5 mg/kg into the vein every 14 (fourteen) days.    . fluorouracil CALGB 36629 in sodium chloride 0.9 % 150 mL Inject 2,400 mg/m2 into the vein over 48 hr.    . hydrOXYzine (VISTARIL) 25 MG capsule Take 1 capsule (25 mg total) by mouth 3 (three) times daily as needed. Prn anxiety 60 capsule 2  . LEUCOVORIN CALCIUM IV Inject 400 mg/m2 into the vein every 21 ( twenty-one) days.    Marland Kitchen lidocaine-prilocaine (EMLA) cream Apply small amount to port a cath site and cover with plastic wrap 1 hour prior to chemotherapy appointments 30 g 3  . OXALIPLATIN IV Inject 85 mg/m2 into the vein every 14 (fourteen) days.    Marland Kitchen oxyCODONE (OXY IR/ROXICODONE) 5 MG immediate release tablet TAKE 1 TABLET BY MOUTH EVERY 6 HOURS AS NEEDED FOR MODERATE PAIN. 120 tablet 0  . PARoxetine (  PAXIL) 40 MG tablet Take 1 tablet (40 mg total) by mouth every morning. 90 tablet 3  . polyethylene glycol (MIRALAX / GLYCOLAX) packet Take 17 g by mouth every other day.    . prochlorperazine (COMPAZINE) 10 MG tablet Take 1 tablet (10 mg total) by mouth every 6 (six) hours as needed (Nausea or vomiting). 30 tablet 1  . triamterene-hydrochlorothiazide (MAXZIDE-25) 37.5-25 MG tablet Take 1 tablet by mouth daily.     No current facility-administered medications for this visit.    ALLERGIES:  No Known Allergies  PHYSICAL EXAM:  Performance status (ECOG): 1 - Symptomatic but completely ambulatory  Vitals:   09/25/20 0836  BP: (!) 145/77  Pulse: 81  Resp: 18  Temp: (!) 96.8 F (36 C)  SpO2: 97%   Wt Readings from Last 3 Encounters:  09/25/20 145 lb 6.4 oz (66 kg)  09/11/20 144 lb 6.4 oz (65.5 kg)  08/28/20 138 lb 12.8 oz (63 kg)   Physical Exam Vitals reviewed.  Constitutional:      Appearance: Normal appearance.  Cardiovascular:     Rate and Rhythm: Normal rate and regular rhythm.     Pulses: Normal pulses.     Heart sounds: Normal heart sounds.  Pulmonary:      Effort: Pulmonary effort is normal.     Breath sounds: Normal breath sounds.  Chest:     Comments: Port-a-Cath in R chest Abdominal:     Palpations: Abdomen is soft. There is no hepatomegaly or mass.     Tenderness: There is no abdominal tenderness.     Comments: Colostomy bag  Musculoskeletal:     Right lower leg: No edema.     Left lower leg: No edema.  Neurological:     General: No focal deficit present.     Mental Status: She is alert and oriented to person, place, and time.  Psychiatric:        Mood and Affect: Mood normal.        Behavior: Behavior normal.     LABORATORY DATA:  I have reviewed the labs as listed.  CBC Latest Ref Rng & Units 09/25/2020 09/11/2020 08/28/2020  WBC 4.0 - 10.5 K/uL 8.8 13.6(H) 10.6(H)  Hemoglobin 12.0 - 15.0 g/dL 13.2 13.0 13.6  Hematocrit 36.0 - 46.0 % 40.2 39.4 42.1  Platelets 150 - 400 K/uL 167 149(L) 182   CMP Latest Ref Rng & Units 09/25/2020 09/11/2020 08/28/2020  Glucose 70 - 99 mg/dL 139(H) 108(H) 121(H)  BUN 6 - 20 mg/dL _0 Creatinine 0.44 - 1.00 mg/dL 0.71 0.65 0.76  Sodium 135 - 145 mmol/L 135 138 136  Potassium 3.5 - 5.1 mmol/L 3.7 3.7 3.5  Chloride 98 - 111 mmol/L 98 102 98  CO2 22 - 32 mmol/L _1 Calcium 8.9 - 10.3 mg/dL 9.6 9.2 9.1  Total Protein 6.5 - 8.1 g/dL 7.3 7.3 7.4  Total Bilirubin 0.3 - 1.2 mg/dL 0.6 0.5 0.6  Alkaline Phos 38 - 126 U/L 144(H) 155(H) 149(H)  AST 15 - 41 U/L 31 35 33  ALT 0 - 44 U/L 28 32 26    DIAGNOSTIC IMAGING:  I have independently reviewed the scans and discussed with the patient. CT CHEST ABDOMEN PELVIS W CONTRAST  Result Date: 08/28/2020 CLINICAL DATA:  60 year old female with history of colorectal cancer with metastatic disease to the liver. Follow-up study. EXAM: CT CHEST, ABDOMEN, AND PELVIS WITH CONTRAST TECHNIQUE: Multidetector CT imaging of the chest, abdomen and pelvis  was performed following the standard protocol during bolus administration of intravenous contrast. CONTRAST:   158m OMNIPAQUE IOHEXOL 300 MG/ML  SOLN COMPARISON:  CT of the abdomen and pelvis 05/07/2020. Chest CT 05/02/2020. FINDINGS: CT CHEST FINDINGS Cardiovascular: Heart size is normal. There is no significant pericardial fluid, thickening or pericardial calcification. Calcifications of the aortic valve. Right internal jugular single-lumen porta cath with tip terminating in the distal superior vena cava. Mediastinum/Nodes: No pathologically enlarged mediastinal or hilar lymph nodes. Esophagus is unremarkable in appearance. No axillary lymphadenopathy. Lungs/Pleura: Multiple tiny pulmonary nodules are noted throughout the lungs bilaterally, stable in size and number compared to the prior examination. The largest of these is in the left lower lobe associated with the major fissure (axial image 81 of series 8) measuring 9 x 3 mm (mean diameter of 6 mm). No other new suspicious appearing pulmonary nodules or masses are noted. No acute consolidative airspace disease. No pleural effusions. Musculoskeletal: There are no aggressive appearing lytic or blastic lesions noted in the visualized portions of the skeleton. CT ABDOMEN PELVIS FINDINGS Hepatobiliary: Multiple previously noted hypovascular lesions appear generally decreased in size compared to the prior examination. The largest of these lesions on the prior study in the right lobe of the liver (axial image 58 of series 5) currently measures 2.4 x 2.0 cm, previously 3.9 x 3.0 cm on 05/07/2020. Several of the very small lesions seen on the prior examination are not confidently identified on today's examination. No new suspicious appearing hepatic lesions. No intra or extrahepatic biliary ductal dilatation. Gallbladder is normal in appearance. Pancreas: No pancreatic mass. No pancreatic ductal dilatation. No pancreatic or peripancreatic fluid collections or inflammatory changes. Spleen: Unremarkable. Adrenals/Urinary Tract: Bilateral kidneys and bilateral adrenal glands are  normal in appearance. No hydroureteronephrosis. Urinary bladder is normal in appearance. Stomach/Bowel: Normal appearance of the stomach. No pathologic dilatation of small bowel or colon. Status post sigmoid colectomy with Hartmann's pouch and left lower quadrant colostomy. The appendix is not confidently identified and may be surgically absent. Regardless, there are no inflammatory changes noted adjacent to the cecum to suggest the presence of an acute appendicitis at this time. Vascular/Lymphatic: Aortic atherosclerosis, without evidence of aneurysm or dissection noted in the abdominal or pelvic vasculature. No lymphadenopathy noted in the abdomen or pelvis. Reproductive: Status post hysterectomy. Ovaries are not confidently identified may be surgically absent or atrophic. Other: No significant volume of ascites.  No pneumoperitoneum. Musculoskeletal: There are no aggressive appearing lytic or blastic lesions noted in the visualized portions of the skeleton. IMPRESSION: 1. Today's study demonstrates positive response to therapy with regression of numerous hepatic metastatic lesions, which appear generally decreased in number and size compared to prior examinations. 2. Small pulmonary nodules scattered throughout the lungs bilaterally, stable in size and number compared to the prior examination, favored to be benign. Continued attention on follow-up studies is recommended. 3. Status post sigmoid colectomy with left lower quadrant colostomy. 4. Aortic atherosclerosis. 5. There are calcifications of the aortic valve. Echocardiographic correlation for evaluation of potential valvular dysfunction may be warranted if clinically indicated. 6. Additional incidental findings, as above. Electronically Signed   By: DVinnie LangtonM.D.   On: 08/28/2020 10:47     ASSESSMENT:  1. Metastatic colon adenocarcinoma to liver: -Sigmoid colectomy and end colostomy on 04/29/2020 -Pathology pT4a,N1C (1 tumor deposit), 0/8 lymph  nodes involved -MMR proficient, MSI-stable -Liver biopsy on May 03, 2020-adenocarcinoma consistent with colon primary. -CT chest on 05/02/2020 with no evidence of pulmonary metastatic  disease. -CTAP on 05/07/2020 with multiple lesions throughout the liver, largest in the right lobe measuring 3.9 cm, lateral segment left lobe measuring 2.6 cm and inferior right lobe confluent lesion 4.2 x 3.7 cm. No lymphadenopathy. -CEA on 05/02/2020-60.2. -FOLFOX and bevacizumab started on 06/04/2020. -Foundation 1 testing shows MS-stable, KRAS/NRAS wild-type  2. Iron deficiency anemia: -Feraheme on 04/30/2020 and 05/06/2020.  3. 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 -Smoked for 3 to 4 years and quit. -Mother had cancer, type unknown. Maternal uncle had lung cancer. Maternal aunt had lung cancer.   PLAN:  1. Metastatic colon adenocarcinoma to liver, MS-stable: -CT CAP on 08/27/2020 showed liver lesions decreased in size and number.  Small stable lung lesions indicating benign etiology.  CEA improved to 3.6 on 08/29/2020. -She is having cold sensitivity for 1 week.  She is also having some mucositis lasting first few days.  I have recommended rinsing mouth with a solution of baking soda and salt in the first week. -Reviewed labs which showed improved alkaline phosphatase of 144 and rest of the normal LFTs.  CBC was normal.  Urine was negative for proteinuria. -She will proceed with next cycle of FOLFOX and bevacizumab today and in 2 weeks.  RTC 4 weeks for follow-up.  2. Iron deficiency anemia: -Hemoglobin improved to 13.2 after parenteral iron therapy.  3. Abdominal pain: -Abdominal pain has improved.  She is not requiring Percocet lately.  4. Peripheral neuropathy: -Cold sensitivity is lasting for about 1 week.  No constant numbness reported.  Continue oxaliplatin at 20% dose reduction.  5. Anxiety: -Continue hydroxyzine 25 mg 3 times a day.   Orders placed  this encounter:  No orders of the defined types were placed in this encounter.    Derek Jack, MD [place] [phone]   I, Milinda Antis, am acting as a scribe for Dr. Sanda Linger.  I, Derek Jack MD, have reviewed the above documentation for accuracy and completeness, and I agree with the above.

## 2020-09-25 NOTE — Patient Instructions (Signed)
[place] at [facility] Discharge Instructions  You were seen today by Dr. Delton Coombes. He went over your recent results. You received your treatment today; continue getting your treatment every 2 weeks. Dr. Delton Coombes will see you back in 1 month for labs and follow up.   Thank you for choosing [place] at [facility] to provide your oncology and hematology care.  To afford each patient quality time with our provider, please arrive at least 15 minutes before your scheduled appointment time.   If you have a lab appointment with the [facility] please come in thru the Main Entrance and check in at the main information desk  You need to re-schedule your appointment should you arrive 10 or more minutes late.  We strive to give you quality time with our providers, and arriving late affects you and other patients whose appointments are after yours.  Also, if you no show three or more times for appointments you may be dismissed from the clinic at the providers discretion.     Again, thank you for choosing [facility].  Our hope is that these requests will decrease the amount of time that you wait before being seen by our physicians.       _____________________________________________________________  Should you have questions after your visit to [facility], please contact our office at [phone] between the hours of 8:00 a.m. and 4:30 p.m.  Voicemails left after 4:00 p.m. will not be returned until the following business day.  For prescription refill requests, have your pharmacy contact our office and allow 72 hours.    Cancer Center Support Programs:   > Cancer Support Group  2nd Tuesday of the month 1pm-2pm, Journey Room

## 2020-09-25 NOTE — Patient Instructions (Signed)
[facility] Discharge Instructions for Patients Receiving Chemotherapy  Today you received the following chemotherapy agents Avastin/FOLFOX,  5FU pump connection  To help prevent nausea and vomiting after your treatment, we encourage you to take your nausea medication    If you develop nausea and vomiting that is not controlled by your nausea medication, call the clinic.   BELOW ARE SYMPTOMS THAT SHOULD BE REPORTED IMMEDIATELY:  *FEVER GREATER THAN 100.5 F  *CHILLS WITH OR WITHOUT FEVER  NAUSEA AND VOMITING THAT IS NOT CONTROLLED WITH YOUR NAUSEA MEDICATION  *UNUSUAL SHORTNESS OF BREATH  *UNUSUAL BRUISING OR BLEEDING  TENDERNESS IN MOUTH AND THROAT WITH OR WITHOUT PRESENCE OF ULCERS  *URINARY PROBLEMS  *BOWEL PROBLEMS  UNUSUAL RASH Items with * indicate a potential emergency and should be followed up as soon as possible.  Feel free to call the clinic should you have any questions or concerns. The clinic phone number is [phone].  Please show the [place] at check-in to the Emergency Department and triage nurse.

## 2020-09-27 ENCOUNTER — Inpatient Hospital Stay (HOSPITAL_COMMUNITY): Payer: Medicaid Other | Attending: Hematology

## 2020-09-27 ENCOUNTER — Other Ambulatory Visit: Payer: Self-pay

## 2020-09-27 ENCOUNTER — Encounter (HOSPITAL_COMMUNITY): Payer: Self-pay

## 2020-09-27 VITALS — BP 146/69 | HR 76 | Temp 97.0°F | Resp 18

## 2020-09-27 DIAGNOSIS — C189 Malignant neoplasm of colon, unspecified: Secondary | ICD-10-CM

## 2020-09-27 DIAGNOSIS — C787 Secondary malignant neoplasm of liver and intrahepatic bile duct: Secondary | ICD-10-CM | POA: Insufficient documentation

## 2020-09-27 DIAGNOSIS — Z5111 Encounter for antineoplastic chemotherapy: Secondary | ICD-10-CM | POA: Diagnosis not present

## 2020-09-27 DIAGNOSIS — Z5189 Encounter for other specified aftercare: Secondary | ICD-10-CM | POA: Diagnosis not present

## 2020-09-27 MED ORDER — HEPARIN SOD (PORK) LOCK FLUSH 100 UNIT/ML IV SOLN
500.0000 [IU] | Freq: Once | INTRAVENOUS | Status: AC | PRN
  Administered 2020-09-27: 500 [IU]

## 2020-09-27 MED ORDER — SODIUM CHLORIDE 0.9% FLUSH
10.0000 mL | INTRAVENOUS | Status: DC | PRN
  Administered 2020-09-27: 10 mL

## 2020-09-27 MED ORDER — PEGFILGRASTIM-CBQV 6 MG/0.6ML ~~LOC~~ SOSY
6.0000 mg | PREFILLED_SYRINGE | Freq: Once | SUBCUTANEOUS | Status: AC
  Administered 2020-09-27: 6 mg via SUBCUTANEOUS
  Filled 2020-09-27: qty 0.6

## 2020-09-27 NOTE — Patient Instructions (Signed)
Pump removal today. Return as scheduled for chemotherapy treatment and office visit.

## 2020-09-27 NOTE — Progress Notes (Signed)
Susan Martin presents to have home infusion pump d/c'd and for port-a-cath deaccess with flush.  Portacath located right chest wall accessed with  H 20 needle.  Good blood return present. Portacath flushed with NS and 500U/72ml Heparin, and needle removed intact.  Procedure tolerated well and without incident.  Discharged ambulatory in stable condition.

## 2020-10-08 ENCOUNTER — Inpatient Hospital Stay (HOSPITAL_COMMUNITY): Payer: Medicaid Other

## 2020-10-08 ENCOUNTER — Other Ambulatory Visit: Payer: Self-pay

## 2020-10-08 ENCOUNTER — Encounter (HOSPITAL_COMMUNITY): Payer: Self-pay

## 2020-10-08 VITALS — BP 140/62 | HR 73 | Temp 97.4°F | Resp 18

## 2020-10-08 DIAGNOSIS — C787 Secondary malignant neoplasm of liver and intrahepatic bile duct: Secondary | ICD-10-CM

## 2020-10-08 DIAGNOSIS — C189 Malignant neoplasm of colon, unspecified: Secondary | ICD-10-CM

## 2020-10-08 DIAGNOSIS — Z5111 Encounter for antineoplastic chemotherapy: Secondary | ICD-10-CM | POA: Diagnosis not present

## 2020-10-08 LAB — CBC WITH DIFFERENTIAL/PLATELET
Abs Immature Granulocytes: 0.18 10*3/uL — ABNORMAL HIGH (ref 0.00–0.07)
Basophils Absolute: 0.1 10*3/uL (ref 0.0–0.1)
Basophils Relative: 1 %
Eosinophils Absolute: 0.2 10*3/uL (ref 0.0–0.5)
Eosinophils Relative: 2 %
HCT: 38.3 % (ref 36.0–46.0)
Hemoglobin: 12.8 g/dL (ref 12.0–15.0)
Immature Granulocytes: 2 %
Lymphocytes Relative: 21 %
Lymphs Abs: 2.3 10*3/uL (ref 0.7–4.0)
MCH: 32.3 pg (ref 26.0–34.0)
MCHC: 33.4 g/dL (ref 30.0–36.0)
MCV: 96.7 fL (ref 80.0–100.0)
Monocytes Absolute: 0.9 10*3/uL (ref 0.1–1.0)
Monocytes Relative: 8 %
Neutro Abs: 7.3 10*3/uL (ref 1.7–7.7)
Neutrophils Relative %: 66 %
Platelets: 142 10*3/uL — ABNORMAL LOW (ref 150–400)
RBC: 3.96 MIL/uL (ref 3.87–5.11)
RDW: 18.7 % — ABNORMAL HIGH (ref 11.5–15.5)
WBC: 10.9 10*3/uL — ABNORMAL HIGH (ref 4.0–10.5)
nRBC: 0 % (ref 0.0–0.2)

## 2020-10-08 LAB — COMPREHENSIVE METABOLIC PANEL
ALT: 32 U/L (ref 0–44)
AST: 29 U/L (ref 15–41)
Albumin: 3.3 g/dL — ABNORMAL LOW (ref 3.5–5.0)
Alkaline Phosphatase: 149 U/L — ABNORMAL HIGH (ref 38–126)
Anion gap: 10 (ref 5–15)
BUN: 14 mg/dL (ref 6–20)
CO2: 27 mmol/L (ref 22–32)
Calcium: 9.1 mg/dL (ref 8.9–10.3)
Chloride: 100 mmol/L (ref 98–111)
Creatinine, Ser: 0.67 mg/dL (ref 0.44–1.00)
GFR, Estimated: >60 mL/min (ref >60)
Glucose, Bld: 142 mg/dL — ABNORMAL HIGH (ref 70–99)
Potassium: 3.5 mmol/L (ref 3.5–5.1)
Sodium: 137 mmol/L (ref 135–145)
Total Bilirubin: 0.5 mg/dL (ref 0.3–1.2)
Total Protein: 6.9 g/dL (ref 6.5–8.1)

## 2020-10-08 LAB — URINALYSIS, DIPSTICK ONLY
Bilirubin Urine: NEGATIVE
Glucose, UA: NEGATIVE mg/dL
Hgb urine dipstick: NEGATIVE
Ketones, ur: NEGATIVE mg/dL
Leukocytes,Ua: NEGATIVE
Nitrite: NEGATIVE
Protein, ur: NEGATIVE mg/dL
Specific Gravity, Urine: 1.013 (ref 1.005–1.030)
pH: 8 (ref 5.0–8.0)

## 2020-10-08 LAB — MAGNESIUM: Magnesium: 2 mg/dL (ref 1.7–2.4)

## 2020-10-08 MED ORDER — SODIUM CHLORIDE 0.9 % IV SOLN: Freq: Once | INTRAVENOUS | Status: AC

## 2020-10-08 MED ORDER — PALONOSETRON HCL INJECTION 0.25 MG/5ML
0.2500 mg | Freq: Once | INTRAVENOUS | Status: AC
  Administered 2020-10-08: 0.25 mg via INTRAVENOUS
  Filled 2020-10-08: qty 5

## 2020-10-08 MED ORDER — FLUOROURACIL CHEMO INJECTION 2.5 GM/50ML
400.0000 mg/m2 | Freq: Once | INTRAVENOUS | Status: AC
  Administered 2020-10-08: 650 mg via INTRAVENOUS
  Filled 2020-10-08: qty 13

## 2020-10-08 MED ORDER — DEXTROSE 5 % IV SOLN: Freq: Once | INTRAVENOUS | Status: AC

## 2020-10-08 MED ORDER — SODIUM CHLORIDE 0.9% FLUSH: 10.0000 mL | INTRAVENOUS | Status: DC | PRN

## 2020-10-08 MED ORDER — SODIUM CHLORIDE 0.9 % IV SOLN
10.0000 mg | Freq: Once | INTRAVENOUS | Status: AC
  Administered 2020-10-08: 10 mg via INTRAVENOUS
  Filled 2020-10-08: qty 10

## 2020-10-08 MED ORDER — LEUCOVORIN CALCIUM INJECTION 350 MG
400.0000 mg/m2 | Freq: Once | INTRAVENOUS | Status: AC
  Administered 2020-10-08: 644 mg via INTRAVENOUS
  Filled 2020-10-08: qty 32.2

## 2020-10-08 MED ORDER — SODIUM CHLORIDE 0.9 % IV SOLN
5.0000 mg/kg | Freq: Once | INTRAVENOUS | Status: AC
  Administered 2020-10-08: 300 mg via INTRAVENOUS
  Filled 2020-10-08: qty 12

## 2020-10-08 MED ORDER — SODIUM CHLORIDE 0.9 % IV SOLN
2400.0000 mg/m2 | INTRAVENOUS | Status: DC
  Administered 2020-10-08: 3850 mg via INTRAVENOUS
  Filled 2020-10-08: qty 77

## 2020-10-08 MED ORDER — DEXTROSE 5 % IV SOLN
Freq: Once | INTRAVENOUS | Status: AC
Start: 2020-10-08 — End: 2020-10-08

## 2020-10-08 MED ORDER — OXALIPLATIN CHEMO INJECTION 100 MG/20ML
68.0000 mg/m2 | Freq: Once | INTRAVENOUS | Status: AC
  Administered 2020-10-08: 110 mg via INTRAVENOUS
  Filled 2020-10-08: qty 20

## 2020-10-08 NOTE — Progress Notes (Signed)
Labs reviewed today. They meet parameters for treatment. No new changes voiced from patient today. Pt states her Peripheral Neuropathy last one week and a day. Reports mouth burning still, last for the whole two weeks in between treatments. Pt rinsing with baking soda and water mixture per MD.   Treatment given per orders. Patient tolerated it well without problems. Vitals stable and discharged home from clinic ambulatory. Follow up as scheduled.

## 2020-10-08 NOTE — Progress Notes (Signed)
Maintain dose of bevacizumab at 300 mg today, Dr Delton Coombes will access with her next dose if increase is needed.  Henreitta Leber, PharmD 10/08/20 @ [phone]

## 2020-10-08 NOTE — Patient Instructions (Signed)
[facility] Discharge Instructions for Patients Receiving Chemotherapy  Today you received the following chemotherapy agents   To help prevent nausea and vomiting after your treatment, we encourage you to take your nausea medication   If you develop nausea and vomiting that is not controlled by your nausea medication, call the clinic.   BELOW ARE SYMPTOMS THAT SHOULD BE REPORTED IMMEDIATELY:  *FEVER GREATER THAN 100.5 F  *CHILLS WITH OR WITHOUT FEVER  NAUSEA AND VOMITING THAT IS NOT CONTROLLED WITH YOUR NAUSEA MEDICATION  *UNUSUAL SHORTNESS OF BREATH  *UNUSUAL BRUISING OR BLEEDING  TENDERNESS IN MOUTH AND THROAT WITH OR WITHOUT PRESENCE OF ULCERS  *URINARY PROBLEMS  *BOWEL PROBLEMS  UNUSUAL RASH Items with * indicate a potential emergency and should be followed up as soon as possible.  Feel free to call the clinic should you have any questions or concerns. The clinic phone number is [phone].  Please show the CHEMO ALERT CARD at check-in to the Emergency Department and triage nurse.   

## 2020-10-09 LAB — CEA: CEA: 2.5 ng/mL (ref 0.0–4.7)

## 2020-10-10 ENCOUNTER — Other Ambulatory Visit: Payer: Self-pay

## 2020-10-10 ENCOUNTER — Inpatient Hospital Stay (HOSPITAL_COMMUNITY): Payer: Medicaid Other

## 2020-10-10 ENCOUNTER — Encounter (HOSPITAL_COMMUNITY): Payer: Self-pay

## 2020-10-10 VITALS — BP 137/82 | HR 76 | Resp 18

## 2020-10-10 DIAGNOSIS — C787 Secondary malignant neoplasm of liver and intrahepatic bile duct: Secondary | ICD-10-CM

## 2020-10-10 DIAGNOSIS — Z5111 Encounter for antineoplastic chemotherapy: Secondary | ICD-10-CM | POA: Diagnosis not present

## 2020-10-10 DIAGNOSIS — C189 Malignant neoplasm of colon, unspecified: Secondary | ICD-10-CM

## 2020-10-10 MED ORDER — HEPARIN SOD (PORK) LOCK FLUSH 100 UNIT/ML IV SOLN
500.0000 [IU] | Freq: Once | INTRAVENOUS | Status: AC | PRN
  Administered 2020-10-10: 500 [IU]

## 2020-10-10 MED ORDER — PEGFILGRASTIM-CBQV 6 MG/0.6ML ~~LOC~~ SOSY
PREFILLED_SYRINGE | SUBCUTANEOUS | Status: AC
  Filled 2020-10-10: qty 0.6

## 2020-10-10 MED ORDER — SODIUM CHLORIDE 0.9% FLUSH
10.0000 mL | INTRAVENOUS | Status: DC | PRN
  Administered 2020-10-10: 10 mL

## 2020-10-10 MED ORDER — PEGFILGRASTIM-CBQV 6 MG/0.6ML ~~LOC~~ SOSY
6.0000 mg | PREFILLED_SYRINGE | Freq: Once | SUBCUTANEOUS | Status: AC
  Administered 2020-10-10: 6 mg via SUBCUTANEOUS

## 2020-10-10 NOTE — Progress Notes (Signed)
Patient tolerated Udenyca injection with no complaints voiced. Site clean and dry with no bruising or swelling noted at site. See MAR for details. Band aid applied.  Patient stable during and after injection.  Chemo Pump d/c. Port flushed with good blood return noted. No bruising or swelling at site. Bandaid applied and patient discharged in satisfactory condition. VVS stable with no signs or symptoms of distressed noted.

## 2020-10-10 NOTE — Patient Instructions (Signed)
[place] at [facility]  Discharge Instructions:  Your pump was d/c and Udenyca injection given. Return as scheduled. _______________________________________________________________  Thank you for choosing [place] at [facility] to provide your oncology and hematology care.  To afford each patient quality time with our providers, please arrive at least 15 minutes before your scheduled appointment.  You need to re-schedule your appointment if you arrive 10 or more minutes late.  We strive to give you quality time with our providers, and arriving late affects you and other patients whose appointments are after yours.  Also, if you no show three or more times for appointments you may be dismissed from the clinic.  Again, thank you for choosing [place] at [place] hope is that these requests will allow you access to exceptional care and in a timely manner. _______________________________________________________________  If you have questions after your visit, please contact our office at [phone] between the hours of 8:30 a.m. and 5:00 p.m. Voicemails left after 4:30 p.m. will not be returned until the following business day. _______________________________________________________________  For prescription refill requests, have your pharmacy contact our office. _______________________________________________________________  Recommendations made by the consultant and any test results will be sent to your referring physician. _______________________________________________________________

## 2020-10-22 NOTE — Progress Notes (Addendum)
[place], [place] [address]   CLINIC:  Medical Oncology/Hematology  PCP:  Patient, No Pcp Per (Inactive) None None   REASON FOR VISIT:  Follow-up for metastatic colon cancer to liver  PRIOR THERAPY: Sigmoid colectomy and end colostomy on 04/29/2020  NGS Results: Foundation 1 KRAS wildtype, MS--stable, TMB 4 Muts/Mb  CURRENT THERAPY: FOLFOX, Avastin & Aloxi every 2 weeks  BRIEF ONCOLOGIC HISTORY:  Oncology History  Metastatic colon cancer to liver ([place])  05/16/2020 Initial Diagnosis   Metastatic colon cancer to liver ([name])   06/03/2020 Genetic Testing   Foundation One:     06/04/2020 -  Chemotherapy    Patient is on Treatment Plan: COLORECTAL FOLFOX + BEVACIZUMAB Q14D        CANCER STAGING: Cancer Staging No matching staging information was found for the patient.  INTERVAL HISTORY:  [name], a 60 y.o. female, returns for routine follow-up and consideration for next cycle of chemotherapy. Azie was last seen on 09/25/2020.  Due for cycle #11 of FOLFOX, Avastin and Aloxi today.   Overall, she tells me she has been feeling pretty well.  She has tolerated her last treatment fairly well.  No major GI side effects noted.  Energy levels are 60% and appetite is 100%.  She reported cold sensitivity lasting about 1 week.  No constant numbness or tingling reported.  Overall, she feels ready for next cycle of chemo today.    REVIEW OF SYSTEMS:  Review of Systems  All other systems reviewed and are negative.   PAST MEDICAL/SURGICAL HISTORY:  Past Medical History:  Diagnosis Date  . 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 Delton Coombes   04-29-2020 emergerency surgery for perforated colon s/p sigmoid colectomy w/ colostomy; dx  Stage IV colon cancer mets to liver  . Anxiety   . Depression   . Hypertension    followed by dr Glo Herring and oncology  (05-27-2020 per pt does not have pcp yet)  . IDA  (iron deficiency anemia)   . Pulmonary nodule, right    Past Surgical History:  Procedure Laterality Date  . ABDOMINAL HYSTERECTOMY  03-31-2016   '@[facility]'$    W/  BILATERAL SALPINOOPHORECTOMY   . APPLICATION OF WOUND VAC N/A 04/29/2020   Procedure: APPLICATION OF WOUND VAC;  Surgeon: Kinsinger, Arta Bruce, MD;  Location: [facility];  Service: General;  Laterality: N/A;  . ENDOMETRIAL ABLATION    . LAPAROTOMY N/A 04/29/2020   Procedure: EXPLORATORY LAPAROTOMY;  Surgeon: Kieth Brightly Arta Bruce, MD;  Location: [facility];  Service: General;  Laterality: N/A;  . PARTIAL COLECTOMY N/A 04/29/2020   Procedure: PARTIAL COLECTOMY WITH END COLOSTOMY;  Surgeon: Kieth Brightly Arta Bruce, MD;  Location: [facility];  Service: General;  Laterality: N/A;  . PORTACATH PLACEMENT Right 05/28/2020   Procedure: INSERTION PORT-A-CATH;  Surgeon: Mickeal Skinner, MD;  Location: [facility];  Service: General;  Laterality: Right;  . SALPINGOOPHORECTOMY Left 03/31/2016   Procedure: LEFT SALPINGO OOPHORECTOMY WITH FROZEN SECTION,  ABDOMINAL HYSTERECTOMY WITH BILATERAL SALPINGO-OOPHORECTOMY;  Surgeon: Jonnie Kind, MD;  Location: [facility];  Service: Gynecology;  Laterality: Left;    SOCIAL HISTORY:  Social History   Socioeconomic History  . Marital status: Divorced    Spouse name: Not on file  . Number of children: Not on file  . Years of education: Not on file  . Highest education level: Not on file  Occupational History  . Not on file  Tobacco Use  . Smoking status: Former Smoker    Packs/day: 0.50    Years: 5.00    Pack years: 2.50    Types: Cigarettes    Quit date: 03/28/2015    Years since quitting: 5.5  . Smokeless tobacco: Never Used  Vaping Use  . Vaping Use: Never used  Substance and Sexual Activity  . Alcohol use: No  . Drug use: Never  . Sexual activity: Yes    Partners: Male    Birth control/protection: Surgical  Other Topics Concern  . Not on file  Social History Narrative  . Not on file    Social Determinants of Health   Financial Resource Strain: Low Risk   . Difficulty of Paying Living Expenses: Not very hard  Food Insecurity: No Food Insecurity  . Worried About Charity fundraiser in the Last Year: Never true  . Ran Out of Food in the Last Year: Never true  Transportation Needs: No Transportation Needs  . Lack of Transportation (Medical): No  . Lack of Transportation (Non-Medical): No  Physical Activity: Insufficiently Active  . Days of Exercise per Week: 2 days  . Minutes of Exercise per Session: 30 min  Stress: Stress Concern Present  . Feeling of Stress : To some extent  Social Connections: Moderately Integrated  . Frequency of Communication with Friends and Family: More than three times a week  . Frequency of Social Gatherings with Friends and Family: Once a week  . Attends Religious Services: More than 4 times per year  . Active Member of Clubs or Organizations: No  . Attends Archivist Meetings: Never  . Marital Status: Living with partner  Intimate Partner Violence: Not At Risk  . Fear of Current or Ex-Partner: No  . Emotionally Abused: No  . Physically Abused: No  . Sexually Abused: No    FAMILY HISTORY:  Family History  Problem Relation Age of Onset  . Hypertension Mother   . Diabetes Mother   . Cirrhosis Father     CURRENT MEDICATIONS:  Current Outpatient Medications  Medication Sig Dispense Refill  . amoxicillin-clavulanate (AUGMENTIN) 875-125 MG tablet TAKE 1 TABLET BY MOUTH TWO TIMES DAILY FOR 7 DAYS. (Patient taking differently: Take 1 tablet by mouth 2 (two) times daily. for 7 days) 14 tablet 0  . BEVACIZUMAB IV Inject 5 mg/kg into the vein every 14 (fourteen) days.    . fluorouracil CALGB 44010 in sodium chloride 0.9 % 150 mL Inject 2,400 mg/m2 into the vein over 48 hr.    . hydrOXYzine (VISTARIL) 25 MG capsule Take 1 capsule (25 mg total) by mouth 3 (three) times daily as needed. Prn anxiety 60 capsule 2  . LEUCOVORIN  CALCIUM IV Inject 400 mg/m2 into the vein every 21 ( twenty-one) days.    Marland Kitchen lidocaine-prilocaine (EMLA) cream Apply small amount to port a cath site and cover with plastic wrap 1 hour prior to chemotherapy appointments 30 g 3  . OXALIPLATIN IV Inject 85 mg/m2 into the vein every 14 (fourteen) days.    Marland Kitchen oxyCODONE (OXY IR/ROXICODONE) 5 MG immediate release tablet TAKE 1 TABLET BY MOUTH EVERY 6 HOURS AS NEEDED FOR MODERATE PAIN. 120 tablet 0  . PARoxetine (PAXIL) 40 MG tablet Take 1 tablet (40 mg total) by mouth every morning. 90 tablet 3  . polyethylene glycol (MIRALAX / GLYCOLAX) packet Take 17 g by mouth every other day.    . prochlorperazine (COMPAZINE) 10 MG tablet Take 1 tablet (10 mg  total) by mouth every 6 (six) hours as needed (Nausea or vomiting). 30 tablet 1  . triamterene-hydrochlorothiazide (MAXZIDE-25) 37.5-25 MG tablet Take 1 tablet by mouth daily.     No current facility-administered medications for this visit.    ALLERGIES:  No Known Allergies  PHYSICAL EXAM:  Performance status (ECOG): 1 - Symptomatic but completely ambulatory  There were no vitals filed for this visit. Wt Readings from Last 3 Encounters:  10/08/20 150 lb 3.2 oz (68.1 kg)  09/25/20 145 lb 6.4 oz (66 kg)  09/11/20 144 lb 6.4 oz (65.5 kg)   Physical Exam Vitals reviewed.  Constitutional:      Appearance: Normal appearance.  Cardiovascular:     Rate and Rhythm: Normal rate and regular rhythm.     Heart sounds: Normal heart sounds.  Pulmonary:     Effort: Pulmonary effort is normal.     Breath sounds: Normal breath sounds.  Chest:     Comments: Port-a-Cath in R chest Abdominal:     General: There is no distension.     Palpations: Abdomen is soft. There is no mass.     Comments: Colostomy bag  Musculoskeletal:        General: No swelling.  Skin:    General: Skin is warm.  Neurological:     General: No focal deficit present.     Mental Status: She is alert.  Psychiatric:        Mood and  Affect: Mood normal.        Behavior: Behavior normal.     LABORATORY DATA:  I have reviewed the labs as listed.  CBC Latest Ref Rng & Units 10/08/2020 09/25/2020 09/11/2020  WBC 4.0 - 10.5 K/uL 10.9(H) 8.8 13.6(H)  Hemoglobin 12.0 - 15.0 g/dL 12.8 13.2 13.0  Hematocrit 36.0 - 46.0 % 38.3 40.2 39.4  Platelets 150 - 400 K/uL 142(L) 167 149(L)   CMP Latest Ref Rng & Units 10/08/2020 09/25/2020 09/11/2020  Glucose 70 - 99 mg/dL 142(H) 139(H) 108(H)  BUN 6 - 20 mg/dL _0 Creatinine 0.44 - 1.00 mg/dL 0.67 0.71 0.65  Sodium 135 - 145 mmol/L 137 135 138  Potassium 3.5 - 5.1 mmol/L 3.5 3.7 3.7  Chloride 98 - 111 mmol/L 100 98 102  CO2 22 - 32 mmol/L _1 Calcium 8.9 - 10.3 mg/dL 9.1 9.6 9.2  Total Protein 6.5 - 8.1 g/dL 6.9 7.3 7.3  Total Bilirubin 0.3 - 1.2 mg/dL 0.5 0.6 0.5  Alkaline Phos 38 - 126 U/L 149(H) 144(H) 155(H)  AST 15 - 41 U/L 29 31 35  ALT 0 - 44 U/L 32 28 32   Lab Results  Component Value Date   CEA1 2.5 10/08/2020   CEA1 3.6 08/28/2020   CEA1 5.2 (H) 07/31/2020    DIAGNOSTIC IMAGING:  I have independently reviewed the scans and discussed with the patient. No results found.   ASSESSMENT:  1. Metastatic colon adenocarcinoma to liver: -Sigmoid colectomy and end colostomy on 04/29/2020 -Pathology pT4a,N1C (1 tumor deposit), 0/8 lymph nodes involved -MMR proficient, MSI-stable -Liver biopsy on May 03, 2020-adenocarcinoma consistent with colon primary. -CT chest on 05/02/2020 with no evidence of pulmonary metastatic disease. -CTAP on 05/07/2020 with multiple lesions throughout the liver, largest in the right lobe measuring 3.9 cm, lateral segment left lobe measuring 2.6 cm and inferior right lobe confluent lesion 4.2 x 3.7 cm. No lymphadenopathy. -CEA on 05/02/2020-60.2. -FOLFOX and bevacizumab started on 06/04/2020. -Foundation 1 testing shows MS-stable, KRAS/NRAS  wild-type  2. Iron deficiency anemia: -Feraheme on 04/30/2020 and 05/06/2020.  3.  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 -Smoked for 3 to 4 years and quit. -Mother had cancer, type unknown. Maternal uncle had lung cancer. Maternal aunt had lung cancer.   PLAN:  1. Metastatic colon adenocarcinoma to liver, MS-stable: -CT CAP on 08/27/2020 showed liver lesions decreased in size and number.  Small stable lung lesions indicating benign etiology. - Last CEA improved to 2.5 on 10/08/2020. - I reviewed her labs which showed normal LFTs except elevated alk phos which also improved to 133.  Platelets are mildly low at 116. - UA was negative for protein. - Proceed with next cycle of chemotherapy today and in 2 weeks.  RTC 4 weeks with repeat CT CAP. - We will consider discontinuation of oxaliplatin after next scan which shows good results.  2. Iron deficiency anemia: -Hemoglobin today is 13.0 and improved after parenteral iron therapy.  3. Abdominal pain: -She is not requiring Percocet lately.  Abdominal pain has improved.  4. Peripheral neuropathy: -Cold sensitivity is lasting for about a week.  No constant numbness. - Continue oxaliplatin at 20% dose reduction.  5. Anxiety: -Continue hydroxyzine 25 mg 3 times a day as needed.   Orders placed this encounter:  No orders of the defined types were placed in this encounter.    Derek Jack, MD [place] [phone]   I, Milinda Antis, am acting as a scribe for Dr. Sanda Linger.  I, Derek Jack MD, have reviewed the above documentation for accuracy and completeness, and I agree with the above.

## 2020-10-23 ENCOUNTER — Inpatient Hospital Stay (HOSPITAL_COMMUNITY): Payer: Medicaid Other

## 2020-10-23 ENCOUNTER — Other Ambulatory Visit: Payer: Self-pay

## 2020-10-23 ENCOUNTER — Inpatient Hospital Stay (HOSPITAL_BASED_OUTPATIENT_CLINIC_OR_DEPARTMENT_OTHER): Payer: Medicaid Other | Admitting: Hematology

## 2020-10-23 VITALS — BP 151/80 | HR 74 | Temp 97.0°F | Resp 18 | Wt 153.6 lb

## 2020-10-23 VITALS — BP 147/81 | HR 72 | Temp 98.2°F | Resp 16

## 2020-10-23 DIAGNOSIS — C189 Malignant neoplasm of colon, unspecified: Secondary | ICD-10-CM

## 2020-10-23 DIAGNOSIS — C787 Secondary malignant neoplasm of liver and intrahepatic bile duct: Secondary | ICD-10-CM

## 2020-10-23 DIAGNOSIS — Z5111 Encounter for antineoplastic chemotherapy: Secondary | ICD-10-CM | POA: Diagnosis not present

## 2020-10-23 LAB — CBC WITH DIFFERENTIAL/PLATELET
Abs Immature Granulocytes: 0.14 10*3/uL — ABNORMAL HIGH (ref 0.00–0.07)
Basophils Absolute: 0.1 10*3/uL (ref 0.0–0.1)
Basophils Relative: 0 %
Eosinophils Absolute: 0.2 10*3/uL (ref 0.0–0.5)
Eosinophils Relative: 2 %
HCT: 39.1 % (ref 36.0–46.0)
Hemoglobin: 13 g/dL (ref 12.0–15.0)
Immature Granulocytes: 1 %
Lymphocytes Relative: 23 %
Lymphs Abs: 2.6 10*3/uL (ref 0.7–4.0)
MCH: 32.7 pg (ref 26.0–34.0)
MCHC: 33.2 g/dL (ref 30.0–36.0)
MCV: 98.5 fL (ref 80.0–100.0)
Monocytes Absolute: 1.1 10*3/uL — ABNORMAL HIGH (ref 0.1–1.0)
Monocytes Relative: 10 %
Neutro Abs: 7.4 10*3/uL (ref 1.7–7.7)
Neutrophils Relative %: 64 %
Platelets: 116 10*3/uL — ABNORMAL LOW (ref 150–400)
RBC: 3.97 MIL/uL (ref 3.87–5.11)
RDW: 17 % — ABNORMAL HIGH (ref 11.5–15.5)
WBC: 11.5 10*3/uL — ABNORMAL HIGH (ref 4.0–10.5)
nRBC: 0 % (ref 0.0–0.2)

## 2020-10-23 LAB — COMPREHENSIVE METABOLIC PANEL
ALT: 23 U/L (ref 0–44)
AST: 24 U/L (ref 15–41)
Albumin: 3.5 g/dL (ref 3.5–5.0)
Alkaline Phosphatase: 133 U/L — ABNORMAL HIGH (ref 38–126)
Anion gap: 9 (ref 5–15)
BUN: 12 mg/dL (ref 6–20)
CO2: 28 mmol/L (ref 22–32)
Calcium: 8.9 mg/dL (ref 8.9–10.3)
Chloride: 98 mmol/L (ref 98–111)
Creatinine, Ser: 0.7 mg/dL (ref 0.44–1.00)
GFR, Estimated: >60 mL/min (ref >60)
Glucose, Bld: 108 mg/dL — ABNORMAL HIGH (ref 70–99)
Potassium: 3.6 mmol/L (ref 3.5–5.1)
Sodium: 135 mmol/L (ref 135–145)
Total Bilirubin: 0.5 mg/dL (ref 0.3–1.2)
Total Protein: 7.1 g/dL (ref 6.5–8.1)

## 2020-10-23 MED ORDER — FLUOROURACIL CHEMO INJECTION 2.5 GM/50ML
400.0000 mg/m2 | Freq: Once | INTRAVENOUS | Status: AC
  Administered 2020-10-23: 650 mg via INTRAVENOUS
  Filled 2020-10-23: qty 13

## 2020-10-23 MED ORDER — DEXTROSE 5 % IV SOLN: Freq: Once | INTRAVENOUS | Status: AC

## 2020-10-23 MED ORDER — SODIUM CHLORIDE 0.9 % IV SOLN
10.0000 mg | Freq: Once | INTRAVENOUS | Status: AC
  Administered 2020-10-23: 10 mg via INTRAVENOUS
  Filled 2020-10-23: qty 10

## 2020-10-23 MED ORDER — SODIUM CHLORIDE 0.9 % IV SOLN: Freq: Once | INTRAVENOUS | Status: AC

## 2020-10-23 MED ORDER — SODIUM CHLORIDE 0.9 % IV SOLN
350.0000 mg | Freq: Once | INTRAVENOUS | Status: AC
  Administered 2020-10-23: 350 mg via INTRAVENOUS
  Filled 2020-10-23: qty 14

## 2020-10-23 MED ORDER — LEUCOVORIN CALCIUM INJECTION 350 MG
400.0000 mg/m2 | Freq: Once | INTRAVENOUS | Status: AC
  Administered 2020-10-23: 644 mg via INTRAVENOUS
  Filled 2020-10-23: qty 32.2

## 2020-10-23 MED ORDER — OXALIPLATIN CHEMO INJECTION 100 MG/20ML
68.0000 mg/m2 | Freq: Once | INTRAVENOUS | Status: AC
  Administered 2020-10-23: 110 mg via INTRAVENOUS
  Filled 2020-10-23: qty 20

## 2020-10-23 MED ORDER — SODIUM CHLORIDE 0.9 % IV SOLN
2400.0000 mg/m2 | INTRAVENOUS | Status: DC
  Administered 2020-10-23: 3850 mg via INTRAVENOUS
  Filled 2020-10-23: qty 77

## 2020-10-23 MED ORDER — PALONOSETRON HCL INJECTION 0.25 MG/5ML
0.2500 mg | Freq: Once | INTRAVENOUS | Status: AC
Start: 2020-10-23 — End: 2020-10-23
  Administered 2020-10-23: 0.25 mg via INTRAVENOUS
  Filled 2020-10-23: qty 5

## 2020-10-23 NOTE — Progress Notes (Signed)
Patient was assessed by Dr. Katragadda and labs have been reviewed.  Patient is okay to proceed with treatment today. Primary RN and pharmacy aware.   

## 2020-10-23 NOTE — Patient Instructions (Addendum)
[place] at [facility] Discharge Instructions  You were seen and examined today by Dr. Delton Coombes. You received your treatment today. Dr. Delton Coombes is ordering CT of your chest abdomen and pelvis before you return for your next appointment.  Please follow up as scheduled.   Thank you for choosing [place] at [facility] to provide your oncology and hematology care.  To afford each patient quality time with our provider, please arrive at least 15 minutes before your scheduled appointment time.   If you have a lab appointment with the [facility] please come in thru the Main Entrance and check in at the main information desk.  You need to re-schedule your appointment should you arrive 10 or more minutes late.  We strive to give you quality time with our providers, and arriving late affects you and other patients whose appointments are after yours.  Also, if you no show three or more times for appointments you may be dismissed from the clinic at the providers discretion.     Again, thank you for choosing [facility].  Our hope is that these requests will decrease the amount of time that you wait before being seen by our physicians.       _____________________________________________________________  Should you have questions after your visit to [facility], please contact our office at [phone] and follow the prompts.  Our office hours are 8:00 a.m. and 4:30 p.m. Monday - Friday.  Please note that voicemails left after 4:00 p.m. may not be returned until the following business day.  We are closed weekends and major holidays.  You do have access to a nurse 24-7, just call the main number to the clinic [phone] and do not press any options, hold on the line and a nurse will answer the phone.    For prescription refill requests, have your pharmacy contact our office and allow 72 hours.    Due to Covid, you will  need to wear a mask upon entering the hospital. If you do not have a mask, a mask will be given to you at the Main Entrance upon arrival. For doctor visits, patients may have 1 support person age 74 or older with them. For treatment visits, patients can not have anyone with them due to social distancing guidelines and our immunocompromised population.

## 2020-10-23 NOTE — Progress Notes (Signed)
Adjust dose of bevacizumab with new weight: 69.7kg  T.O. Dr Rhys Martini, PharmD

## 2020-10-23 NOTE — Progress Notes (Signed)
Labs reviewed with MD today. Will proceed with treatment per MD.   Treatment given per orders. Patient tolerated it well without problems. Vitals stable and discharged home from clinic ambulatory. Follow up as scheduled.  

## 2020-10-23 NOTE — Patient Instructions (Signed)
[facility]  Discharge Instructions: Thank you for choosing [facility] to provide your oncology and hematology care.  If you have a lab appointment with the Cancer Center, please come in thru the Main Entrance and check in at the main information desk.  Wear comfortable clothing and clothing appropriate for easy access to any Portacath or PICC line.   We strive to give you quality time with your provider. You may need to reschedule your appointment if you arrive late (15 or more minutes).  Arriving late affects you and other patients whose appointments are after yours.  Also, if you miss three or more appointments without notifying the office, you may be dismissed from the clinic at the provider's discretion.      For prescription refill requests, have your pharmacy contact our office and allow 72 hours for refills to be completed.    Today you received the following chemotherapy    To help prevent nausea and vomiting after your treatment, we encourage you to take your nausea medication as directed.  BELOW ARE SYMPTOMS THAT SHOULD BE REPORTED IMMEDIATELY: . *FEVER GREATER THAN 100.4 F (38 C) OR HIGHER . *CHILLS OR SWEATING . *NAUSEA AND VOMITING THAT IS NOT CONTROLLED WITH YOUR NAUSEA MEDICATION . *UNUSUAL SHORTNESS OF BREATH . *UNUSUAL BRUISING OR BLEEDING . *URINARY PROBLEMS (pain or burning when urinating, or frequent urination) . *BOWEL PROBLEMS (unusual diarrhea, constipation, pain near the anus) . TENDERNESS IN MOUTH AND THROAT WITH OR WITHOUT PRESENCE OF ULCERS (sore throat, sores in mouth, or a toothache) . UNUSUAL RASH, SWELLING OR PAIN  . UNUSUAL VAGINAL DISCHARGE OR ITCHING   Items with * indicate a potential emergency and should be followed up as soon as possible or go to the Emergency Department if any problems should occur.  Please show the CHEMOTHERAPY ALERT CARD or IMMUNOTHERAPY ALERT CARD at check-in to the Emergency Department and triage  nurse.  Should you have questions after your visit or need to cancel or reschedule your appointment, please contact [facility] [phone]  and follow the prompts.  Office hours are 8:00 a.m. to 4:30 p.m. Monday - Friday. Please note that voicemails left after 4:00 p.m. may not be returned until the following business day.  We are closed weekends and major holidays. You have access to a nurse at all times for urgent questions. Please call the main number to the clinic [phone] and follow the prompts.  For any non-urgent questions, you may also contact your provider using MyChart. We now offer e-Visits for anyone 18 and older to request care online for non-urgent symptoms. For details visit [email or web address].   Also download the MyChart app! Go to the app store, search "MyChart", open the app, select [facility], and log in with your MyChart username and password.  Due to Covid, a mask is required upon entering the hospital/clinic. If you do not have a mask, one will be given to you upon arrival. For doctor visits, patients may have 1 support person aged 18 or older with them. For treatment visits, patients cannot have anyone with them due to current Covid guidelines and our immunocompromised population.  

## 2020-10-24 LAB — CEA: CEA: 2.1 ng/mL (ref 0.0–4.7)

## 2020-10-25 ENCOUNTER — Inpatient Hospital Stay (HOSPITAL_COMMUNITY): Payer: Medicaid Other

## 2020-10-25 ENCOUNTER — Other Ambulatory Visit: Payer: Self-pay

## 2020-10-25 VITALS — BP 142/69 | HR 78 | Temp 98.4°F | Resp 18 | Wt 156.0 lb

## 2020-10-25 DIAGNOSIS — C787 Secondary malignant neoplasm of liver and intrahepatic bile duct: Secondary | ICD-10-CM

## 2020-10-25 DIAGNOSIS — C189 Malignant neoplasm of colon, unspecified: Secondary | ICD-10-CM

## 2020-10-25 DIAGNOSIS — Z5111 Encounter for antineoplastic chemotherapy: Secondary | ICD-10-CM | POA: Diagnosis not present

## 2020-10-25 MED ORDER — SODIUM CHLORIDE 0.9% FLUSH
10.0000 mL | INTRAVENOUS | Status: DC | PRN
  Administered 2020-10-25: 10 mL via INTRAVENOUS

## 2020-10-25 MED ORDER — PEGFILGRASTIM-CBQV 6 MG/0.6ML ~~LOC~~ SOSY
6.0000 mg | PREFILLED_SYRINGE | Freq: Once | SUBCUTANEOUS | Status: AC
  Administered 2020-10-25: 6 mg via SUBCUTANEOUS

## 2020-10-25 MED ORDER — PEGFILGRASTIM-CBQV 6 MG/0.6ML ~~LOC~~ SOSY
PREFILLED_SYRINGE | SUBCUTANEOUS | Status: AC
  Filled 2020-10-25: qty 0.6

## 2020-10-25 MED ORDER — HEPARIN SOD (PORK) LOCK FLUSH 100 UNIT/ML IV SOLN
500.0000 [IU] | Freq: Once | INTRAVENOUS | Status: AC
  Administered 2020-10-25: 500 [IU] via INTRAVENOUS

## 2020-10-25 NOTE — Progress Notes (Signed)
Patients port flushed without difficulty.  Good blood return noted with no bruising or swelling noted at site.  Band aid applied.  VSS with discharge and left in satisfactory condition with no s/s of distress noted.   Patient tolerated Udenyca injection with no complaints voiced.  Site clean and dry with no bruising or swelling noted.  No complaints of pain.  Discharged with vital signs stable and no signs or symptoms of distress noted.  

## 2020-11-06 ENCOUNTER — Other Ambulatory Visit: Payer: Self-pay

## 2020-11-06 ENCOUNTER — Inpatient Hospital Stay (HOSPITAL_COMMUNITY): Payer: Medicaid Other | Attending: Hematology

## 2020-11-06 ENCOUNTER — Inpatient Hospital Stay (HOSPITAL_COMMUNITY): Payer: Medicaid Other

## 2020-11-06 ENCOUNTER — Inpatient Hospital Stay (HOSPITAL_BASED_OUTPATIENT_CLINIC_OR_DEPARTMENT_OTHER): Payer: Medicaid Other | Admitting: Hematology

## 2020-11-06 VITALS — BP 157/78 | HR 74 | Temp 97.1°F | Resp 18 | Wt 154.0 lb

## 2020-11-06 DIAGNOSIS — C189 Malignant neoplasm of colon, unspecified: Secondary | ICD-10-CM

## 2020-11-06 DIAGNOSIS — R918 Other nonspecific abnormal finding of lung field: Secondary | ICD-10-CM | POA: Diagnosis not present

## 2020-11-06 DIAGNOSIS — Z5111 Encounter for antineoplastic chemotherapy: Secondary | ICD-10-CM | POA: Insufficient documentation

## 2020-11-06 DIAGNOSIS — I10 Essential (primary) hypertension: Secondary | ICD-10-CM | POA: Insufficient documentation

## 2020-11-06 DIAGNOSIS — Z87891 Personal history of nicotine dependence: Secondary | ICD-10-CM | POA: Insufficient documentation

## 2020-11-06 DIAGNOSIS — C787 Secondary malignant neoplasm of liver and intrahepatic bile duct: Secondary | ICD-10-CM | POA: Diagnosis not present

## 2020-11-06 DIAGNOSIS — F419 Anxiety disorder, unspecified: Secondary | ICD-10-CM | POA: Insufficient documentation

## 2020-11-06 DIAGNOSIS — G629 Polyneuropathy, unspecified: Secondary | ICD-10-CM | POA: Insufficient documentation

## 2020-11-06 DIAGNOSIS — D696 Thrombocytopenia, unspecified: Secondary | ICD-10-CM | POA: Insufficient documentation

## 2020-11-06 DIAGNOSIS — C187 Malignant neoplasm of sigmoid colon: Secondary | ICD-10-CM | POA: Diagnosis not present

## 2020-11-06 DIAGNOSIS — R109 Unspecified abdominal pain: Secondary | ICD-10-CM | POA: Diagnosis not present

## 2020-11-06 DIAGNOSIS — Z79899 Other long term (current) drug therapy: Secondary | ICD-10-CM | POA: Insufficient documentation

## 2020-11-06 DIAGNOSIS — D509 Iron deficiency anemia, unspecified: Secondary | ICD-10-CM | POA: Insufficient documentation

## 2020-11-06 LAB — COMPREHENSIVE METABOLIC PANEL
ALT: 27 U/L (ref 0–44)
AST: 26 U/L (ref 15–41)
Albumin: 3.3 g/dL — ABNORMAL LOW (ref 3.5–5.0)
Alkaline Phosphatase: 151 U/L — ABNORMAL HIGH (ref 38–126)
Anion gap: 6 (ref 5–15)
BUN: 11 mg/dL (ref 6–20)
CO2: 28 mmol/L (ref 22–32)
Calcium: 8.6 mg/dL — ABNORMAL LOW (ref 8.9–10.3)
Chloride: 102 mmol/L (ref 98–111)
Creatinine, Ser: 0.65 mg/dL (ref 0.44–1.00)
GFR, Estimated: >60 mL/min (ref >60)
Glucose, Bld: 123 mg/dL — ABNORMAL HIGH (ref 70–99)
Potassium: 3.6 mmol/L (ref 3.5–5.1)
Sodium: 136 mmol/L (ref 135–145)
Total Bilirubin: 0.5 mg/dL (ref 0.3–1.2)
Total Protein: 7.1 g/dL (ref 6.5–8.1)

## 2020-11-06 LAB — CBC WITH DIFFERENTIAL/PLATELET
Abs Immature Granulocytes: 0.07 10*3/uL (ref 0.00–0.07)
Basophils Absolute: 0 10*3/uL (ref 0.0–0.1)
Basophils Relative: 0 %
Eosinophils Absolute: 0.2 10*3/uL (ref 0.0–0.5)
Eosinophils Relative: 2 %
HCT: 38.3 % (ref 36.0–46.0)
Hemoglobin: 12.5 g/dL (ref 12.0–15.0)
Immature Granulocytes: 1 %
Lymphocytes Relative: 24 %
Lymphs Abs: 2.2 10*3/uL (ref 0.7–4.0)
MCH: 32.5 pg (ref 26.0–34.0)
MCHC: 32.6 g/dL (ref 30.0–36.0)
MCV: 99.5 fL (ref 80.0–100.0)
Monocytes Absolute: 0.9 10*3/uL (ref 0.1–1.0)
Monocytes Relative: 10 %
Neutro Abs: 5.8 10*3/uL (ref 1.7–7.7)
Neutrophils Relative %: 63 %
Platelets: 97 10*3/uL — ABNORMAL LOW (ref 150–400)
RBC: 3.85 MIL/uL — ABNORMAL LOW (ref 3.87–5.11)
RDW: 15.9 % — ABNORMAL HIGH (ref 11.5–15.5)
WBC: 9.2 10*3/uL (ref 4.0–10.5)
nRBC: 0 % (ref 0.0–0.2)

## 2020-11-06 LAB — MAGNESIUM: Magnesium: 2 mg/dL (ref 1.7–2.4)

## 2020-11-06 MED ORDER — DEXTROSE 5 % IV SOLN: Freq: Once | INTRAVENOUS | Status: AC

## 2020-11-06 MED ORDER — FLUOROURACIL CHEMO INJECTION 2.5 GM/50ML
544.0000 mg | Freq: Once | INTRAVENOUS | Status: AC
  Administered 2020-11-06: 550 mg via INTRAVENOUS
  Filled 2020-11-06: qty 11

## 2020-11-06 MED ORDER — OXALIPLATIN CHEMO INJECTION 100 MG/20ML
115.0000 mg | Freq: Once | INTRAVENOUS | Status: AC
  Administered 2020-11-06: 115 mg via INTRAVENOUS
  Filled 2020-11-06: qty 20

## 2020-11-06 MED ORDER — SODIUM CHLORIDE 0.9 % IV SOLN
3200.0000 mg | INTRAVENOUS | Status: DC
  Administered 2020-11-06: 3200 mg via INTRAVENOUS
  Filled 2020-11-06: qty 64

## 2020-11-06 MED ORDER — LEUCOVORIN CALCIUM INJECTION 350 MG
680.0000 mg | Freq: Once | INTRAVENOUS | Status: AC
  Administered 2020-11-06: 680 mg via INTRAVENOUS
  Filled 2020-11-06: qty 34

## 2020-11-06 MED ORDER — SODIUM CHLORIDE 0.9 % IV SOLN
10.0000 mg | Freq: Once | INTRAVENOUS | Status: AC
  Administered 2020-11-06: 10 mg via INTRAVENOUS
  Filled 2020-11-06: qty 10

## 2020-11-06 MED ORDER — PALONOSETRON HCL INJECTION 0.25 MG/5ML
0.2500 mg | Freq: Once | INTRAVENOUS | Status: AC
  Administered 2020-11-06: 0.25 mg via INTRAVENOUS
  Filled 2020-11-06: qty 5

## 2020-11-06 MED ORDER — SODIUM CHLORIDE 0.9 % IV SOLN
5.0000 mg/kg | Freq: Once | INTRAVENOUS | Status: AC
  Administered 2020-11-06: 350 mg via INTRAVENOUS
  Filled 2020-11-06: qty 14

## 2020-11-06 MED ORDER — SODIUM CHLORIDE 0.9 % IV SOLN: Freq: Once | INTRAVENOUS | Status: AC

## 2020-11-06 NOTE — Progress Notes (Signed)
Labs reviewed with MD today. Will proceed as planned with treatment today. Decreased 5FU bolus and treatment by 20% per MD.    Treatment given per orders. Patient tolerated it well without problems. Vitals stable and discharged home from clinic ambulatory. Follow up as scheduled.

## 2020-11-06 NOTE — Progress Notes (Signed)
[place], [place] [address]   CLINIC:  Medical Oncology/Hematology  PCP:  Patient, No Pcp Per (Inactive) None None   REASON FOR VISIT:  Follow-up for metastatic colon cancer to liver  PRIOR THERAPY: Sigmoid colectomy and end colostomy on 04/29/2020  NGS Results: Foundation 1 KRAS wildtype, MS--stable, TMB 4 Muts/Mb  CURRENT THERAPY:  FOLFOX, Avastin & Aloxi every 2 weeks  BRIEF ONCOLOGIC HISTORY:  Oncology History  Metastatic colon cancer to liver ([facility])  05/16/2020 Initial Diagnosis   Metastatic colon cancer to liver ([place])   06/03/2020 Genetic Testing   Foundation One:     06/04/2020 -  Chemotherapy    Patient is on Treatment Plan: COLORECTAL FOLFOX + BEVACIZUMAB Q14D        CANCER STAGING: Cancer Staging No matching staging information was found for the patient.  INTERVAL HISTORY:  [name], a 60 y.o. female, returns for routine follow-up and consideration for next cycle of chemotherapy. Susan Martin was last seen on 10/23/2020.  Due for cycle #12 of FOLFOX and Avastin today.   Overall, she tells me she has been feeling pretty well. She has been tolerating her Tx well without any new symptoms. She reports mild numbness and tingling for a few days following Tx, and cold sensitivity 8 days after Tx. The sensitivity is only present upon touching a cold item. She denies weakness in hands, n/v/d, or abdominal pain. She denies any fevers or new infections. Her energy levels are baseline and appetite is good but she reports abnormal taste and sore mouth that wane after Tx.   Overall, she feels ready for next cycle of chemo today.   REVIEW OF SYSTEMS:  Review of Systems  Constitutional: Positive for fatigue (50%). Negative for appetite change and fever.  HENT:   Positive for mouth sores.   Gastrointestinal: Negative for abdominal pain, diarrhea, nausea and vomiting.  Neurological: Negative for extremity weakness.    PAST  MEDICAL/SURGICAL HISTORY:  Past Medical History:  Diagnosis Date  . 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 Delton Coombes   04-29-2020 [facility] surgery for perforated colon s/p sigmoid colectomy w/ colostomy; dx  Stage IV colon cancer mets to liver  . Anxiety   . Depression   . Hypertension    followed by dr Glo Herring and oncology  (05-27-2020 per pt does not have pcp yet)  . IDA (iron deficiency anemia)   . Pulmonary nodule, right    Past Surgical History:  Procedure Laterality Date  . ABDOMINAL HYSTERECTOMY  03-31-2016   _0    W/  BILATERAL SALPINOOPHORECTOMY   . APPLICATION OF WOUND VAC N/A 04/29/2020   Procedure: APPLICATION OF WOUND VAC;  Surgeon: Kinsinger, Arta Bruce, MD;  Location: [facility];  Service: General;  Laterality: N/A;  . ENDOMETRIAL ABLATION    . LAPAROTOMY N/A 04/29/2020   Procedure: EXPLORATORY LAPAROTOMY;  Surgeon: Kieth Brightly Arta Bruce, MD;  Location: [facility];  Service: General;  Laterality: N/A;  . PARTIAL COLECTOMY N/A 04/29/2020   Procedure: PARTIAL COLECTOMY WITH END COLOSTOMY;  Surgeon: Kieth Brightly Arta Bruce, MD;  Location: [facility];  Service: General;  Laterality: N/A;  . PORTACATH PLACEMENT Right 05/28/2020   Procedure: INSERTION PORT-A-CATH;  Surgeon: Mickeal Skinner, MD;  Location: [facility];  Service: General;  Laterality: Right;  . SALPINGOOPHORECTOMY Left 03/31/2016   Procedure: LEFT SALPINGO OOPHORECTOMY WITH FROZEN SECTION,  ABDOMINAL HYSTERECTOMY WITH BILATERAL SALPINGO-OOPHORECTOMY;  Surgeon: Jonnie Kind, MD;  Location:  [facility];  Service: Gynecology;  Laterality: Left;    SOCIAL HISTORY:  Social History   Socioeconomic History  . Marital status: Divorced    Spouse name: Not on file  . Number of children: Not on file  . Years of education: Not on file  . Highest education level: Not on file  Occupational History  . Not on file  Tobacco Use  . Smoking status: Former Smoker    Packs/day: 0.50     Years: 5.00    Pack years: 2.50    Types: Cigarettes    Quit date: 03/28/2015    Years since quitting: 5.6  . Smokeless tobacco: Never Used  Vaping Use  . Vaping Use: Never used  Substance and Sexual Activity  . Alcohol use: No  . Drug use: Never  . Sexual activity: Yes    Partners: Male    Birth control/protection: Surgical  Other Topics Concern  . Not on file  Social History Narrative  . Not on file   Social Determinants of Health   Financial Resource Strain: Not on file  Food Insecurity: Not on file  Transportation Needs: Not on file  Physical Activity: Not on file  Stress: Not on file  Social Connections: Not on file  Intimate Partner Violence: Not on file    FAMILY HISTORY:  Family History  Problem Relation Age of Onset  . Hypertension Mother   . Diabetes Mother   . Cirrhosis Father     CURRENT MEDICATIONS:  Current Outpatient Medications  Medication Sig Dispense Refill  . BEVACIZUMAB IV Inject 5 mg/kg into the vein every 14 (fourteen) days.    . fluorouracil CALGB 56256 in sodium chloride 0.9 % 150 mL Inject 2,400 mg/m2 into the vein over 48 hr.    . hydrOXYzine (VISTARIL) 25 MG capsule Take 1 capsule (25 mg total) by mouth 3 (three) times daily as needed. Prn anxiety 60 capsule 2  . LEUCOVORIN CALCIUM IV Inject 400 mg/m2 into the vein every 21 ( twenty-one) days.    Marland Kitchen lidocaine-prilocaine (EMLA) cream Apply small amount to port a cath site and cover with plastic wrap 1 hour prior to chemotherapy appointments 30 g 3  . OXALIPLATIN IV Inject 85 mg/m2 into the vein every 14 (fourteen) days.    Marland Kitchen oxyCODONE (OXY IR/ROXICODONE) 5 MG immediate release tablet TAKE 1 TABLET BY MOUTH EVERY 6 HOURS AS NEEDED FOR MODERATE PAIN. 120 tablet 0  . PARoxetine (PAXIL) 40 MG tablet Take 1 tablet (40 mg total) by mouth every morning. 90 tablet 3  . polyethylene glycol (MIRALAX / GLYCOLAX) packet Take 17 g by mouth every other day.    . prochlorperazine (COMPAZINE) 10 MG tablet  Take 1 tablet (10 mg total) by mouth every 6 (six) hours as needed (Nausea or vomiting). 30 tablet 1  . triamterene-hydrochlorothiazide (MAXZIDE-25) 37.5-25 MG tablet Take 1 tablet by mouth daily.     No current facility-administered medications for this visit.    ALLERGIES:  No Known Allergies  PHYSICAL EXAM:  Performance status (ECOG): 1 - Symptomatic but completely ambulatory  There were no vitals filed for this visit. Wt Readings from Last 3 Encounters:  10/25/20 156 lb (70.8 kg)  10/23/20 153 lb 9.6 oz (69.7 kg)  10/08/20 150 lb 3.2 oz (68.1 kg)   Physical Exam Vitals reviewed.  Constitutional:      Appearance: Normal appearance.  Cardiovascular:     Rate and Rhythm: Normal rate and regular rhythm.  Pulses: Normal pulses.     Heart sounds: Normal heart sounds.  Pulmonary:     Effort: Pulmonary effort is normal.     Breath sounds: Normal breath sounds.  Abdominal:     Palpations: Abdomen is soft. There is no hepatomegaly, splenomegaly or mass.     Tenderness: There is no abdominal tenderness.     Comments: Colostomy bag left abdomen  Musculoskeletal:     Right lower leg: No edema.     Left lower leg: No edema.  Neurological:     General: No focal deficit present.     Mental Status: She is alert and oriented to person, place, and time.  Psychiatric:        Mood and Affect: Mood normal.        Behavior: Behavior normal.     LABORATORY DATA:  I have reviewed the labs as listed.  CBC Latest Ref Rng & Units 10/23/2020 10/08/2020 09/25/2020  WBC 4.0 - 10.5 K/uL 11.5(H) 10.9(H) 8.8  Hemoglobin 12.0 - 15.0 g/dL 13.0 12.8 13.2  Hematocrit 36.0 - 46.0 % 39.1 38.3 40.2  Platelets 150 - 400 K/uL 116(L) 142(L) 167   CMP Latest Ref Rng & Units 10/23/2020 10/08/2020 09/25/2020  Glucose 70 - 99 mg/dL 108(H) 142(H) 139(H)  BUN 6 - 20 mg/dL _0 Creatinine 0.44 - 1.00 mg/dL 0.70 0.67 0.71  Sodium 135 - 145 mmol/L 135 137 135  Potassium 3.5 - 5.1 mmol/L 3.6 3.5 3.7   Chloride 98 - 111 mmol/L 98 100 98  CO2 22 - 32 mmol/L _1 Calcium 8.9 - 10.3 mg/dL 8.9 9.1 9.6  Total Protein 6.5 - 8.1 g/dL 7.1 6.9 7.3  Total Bilirubin 0.3 - 1.2 mg/dL 0.5 0.5 0.6  Alkaline Phos 38 - 126 U/L 133(H) 149(H) 144(H)  AST 15 - 41 U/L _2 ALT 0 - 44 U/L 23 32 28    DIAGNOSTIC IMAGING:  I have independently reviewed the scans and discussed with the patient. No results found.   ASSESSMENT:  1. Metastatic colon adenocarcinoma to liver: -Sigmoid colectomy and end colostomy on 04/29/2020 -Pathology pT4a,N1C (1 tumor deposit), 0/8 lymph nodes involved -MMR proficient, MSI-stable -Liver biopsy on May 03, 2020-adenocarcinoma consistent with colon primary. -CT chest on 05/02/2020 with no evidence of pulmonary metastatic disease. -CTAP on 05/07/2020 with multiple lesions throughout the liver, largest in the right lobe measuring 3.9 cm, lateral segment left lobe measuring 2.6 cm and inferior right lobe confluent lesion 4.2 x 3.7 cm. No lymphadenopathy. -CEA on 05/02/2020-60.2. -FOLFOX and bevacizumab started on 06/04/2020. -Foundation 1 testing shows MS-stable, KRAS/NRAS wild-type  2. Iron deficiency anemia: -Feraheme on 04/30/2020 and 05/06/2020.  3. 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 -Smoked for 3 to 4 years and quit. -Mother had cancer, type unknown. Maternal uncle had lung cancer. Maternal aunt had lung cancer.   PLAN:  1. Metastatic colon adenocarcinoma to liver, MS-stable: -CT CAP on 312 100,022 showed liver lesions decreased in size and number.  Small stable lung lesions. - Last CEA was 2.1 on 10/23/2020. - Alk phos was elevated at 151 with rest of the LFTs normal.  CBC shows mild thrombocytopenia with platelet count 97.  White count was normal. - She reported mouth sores at the angles of the mouth for the last 2 cycles.  There sometimes lasting up to 10 days.  Denied any other GI symptoms. - We will decrease his 5-FU  dose by 20%. -  RTC 2 weeks with CT CAP.  2. Iron deficiency anemia: -After parenteral iron therapy, hemoglobin has improved to 12.5 and stable.  3. Abdominal pain: -She is not requiring any Percocet lately.  Abdominal pain is improved.  4. Peripheral neuropathy: -She is reporting cold sensitivity lasting for over a week.  No constant numbness. - Oxaliplatin is dose reduced by 20%. - We will consider discontinuing oxaliplatin after next cycle if scans are stable.  5. Anxiety: -Continue hydroxyzine 25 mg 3 times a day as needed.   Orders placed this encounter:  No orders of the defined types were placed in this encounter.    Derek Jack, MD [place] [phone]   I, Thana Ates, am acting as a scribe for Dr. Sanda Linger.  I, Derek Jack MD, have reviewed the above documentation for accuracy and completeness, and I agree with the above.

## 2020-11-06 NOTE — Patient Instructions (Signed)
[place] at [facility] Discharge Instructions  You were seen today by Dr. Delton Coombes. He went over your recent results. You will scheduled for a CT of the chest and abdomen in 2 weeks. Dr. Delton Coombes will see you back in 2 weeks for labs and follow up.   Thank you for choosing [place] at [facility] to provide your oncology and hematology care.  To afford each patient quality time with our provider, please arrive at least 15 minutes before your scheduled appointment time.   If you have a lab appointment with the [facility] please come in thru the Main Entrance and check in at the main information desk  You need to re-schedule your appointment should you arrive 10 or more minutes late.  We strive to give you quality time with our providers, and arriving late affects you and other patients whose appointments are after yours.  Also, if you no show three or more times for appointments you may be dismissed from the clinic at the providers discretion.     Again, thank you for choosing [facility]  Our hope is that these requests will decrease the amount of time that you wait before being seen by our physicians.       _____________________________________________________________  Should you have questions after your visit to [facility], please contact our office at [phone] between the hours of 8:00 a.m. and 4:30 p.m.  Voicemails left after 4:00 p.m. will not be returned until the following business day.  For prescription refill requests, have your pharmacy contact our office and allow 72 hours.    Cancer Center Support Programs:   > Cancer Support Group  2nd Tuesday of the month 1pm-2pm, Journey Room

## 2020-11-06 NOTE — Progress Notes (Signed)
Patient assessed and labs reviewed by Dr Delton Coombes.  Treatment plan adjusted.  No distress noted.  Okay for treatment today,

## 2020-11-06 NOTE — Patient Instructions (Signed)
[facility]  Discharge Instructions: Thank you for choosing [facility] to provide your oncology and hematology care.  If you have a lab appointment with the Cancer Center, please come in thru the Main Entrance and check in at the main information desk.  Wear comfortable clothing and clothing appropriate for easy access to any Portacath or PICC line.   We strive to give you quality time with your provider. You may need to reschedule your appointment if you arrive late (15 or more minutes).  Arriving late affects you and other patients whose appointments are after yours.  Also, if you miss three or more appointments without notifying the office, you may be dismissed from the clinic at the provider's discretion.      For prescription refill requests, have your pharmacy contact our office and allow 72 hours for refills to be completed.    Today you received the following chemotherapy    To help prevent nausea and vomiting after your treatment, we encourage you to take your nausea medication as directed.  BELOW ARE SYMPTOMS THAT SHOULD BE REPORTED IMMEDIATELY: . *FEVER GREATER THAN 100.4 F (38 C) OR HIGHER . *CHILLS OR SWEATING . *NAUSEA AND VOMITING THAT IS NOT CONTROLLED WITH YOUR NAUSEA MEDICATION . *UNUSUAL SHORTNESS OF BREATH . *UNUSUAL BRUISING OR BLEEDING . *URINARY PROBLEMS (pain or burning when urinating, or frequent urination) . *BOWEL PROBLEMS (unusual diarrhea, constipation, pain near the anus) . TENDERNESS IN MOUTH AND THROAT WITH OR WITHOUT PRESENCE OF ULCERS (sore throat, sores in mouth, or a toothache) . UNUSUAL RASH, SWELLING OR PAIN  . UNUSUAL VAGINAL DISCHARGE OR ITCHING   Items with * indicate a potential emergency and should be followed up as soon as possible or go to the Emergency Department if any problems should occur.  Please show the CHEMOTHERAPY ALERT CARD or IMMUNOTHERAPY ALERT CARD at check-in to the Emergency Department and triage  nurse.  Should you have questions after your visit or need to cancel or reschedule your appointment, please contact [facility] [phone]  and follow the prompts.  Office hours are 8:00 a.m. to 4:30 p.m. Monday - Friday. Please note that voicemails left after 4:00 p.m. may not be returned until the following business day.  We are closed weekends and major holidays. You have access to a nurse at all times for urgent questions. Please call the main number to the clinic [phone] and follow the prompts.  For any non-urgent questions, you may also contact your provider using MyChart. We now offer e-Visits for anyone 18 and older to request care online for non-urgent symptoms. For details visit [email or web address].   Also download the MyChart app! Go to the app store, search "MyChart", open the app, select [place], and log in with your MyChart username and password.  Due to Covid, a mask is required upon entering the hospital/clinic. If you do not have a mask, one will be given to you upon arrival. For doctor visits, patients may have 1 support person aged 18 or older with them. For treatment visits, patients cannot have anyone with them due to current Covid guidelines and our immunocompromised population.  

## 2020-11-08 ENCOUNTER — Inpatient Hospital Stay (HOSPITAL_COMMUNITY): Payer: Medicaid Other

## 2020-11-08 ENCOUNTER — Other Ambulatory Visit: Payer: Self-pay

## 2020-11-08 VITALS — BP 141/69 | HR 75 | Temp 97.0°F | Resp 18

## 2020-11-08 DIAGNOSIS — Z5111 Encounter for antineoplastic chemotherapy: Secondary | ICD-10-CM | POA: Diagnosis not present

## 2020-11-08 DIAGNOSIS — C189 Malignant neoplasm of colon, unspecified: Secondary | ICD-10-CM

## 2020-11-08 DIAGNOSIS — C787 Secondary malignant neoplasm of liver and intrahepatic bile duct: Secondary | ICD-10-CM

## 2020-11-08 MED ORDER — SODIUM CHLORIDE 0.9% FLUSH
10.0000 mL | INTRAVENOUS | Status: DC | PRN
  Administered 2020-11-08: 10 mL

## 2020-11-08 MED ORDER — PEGFILGRASTIM-CBQV 6 MG/0.6ML ~~LOC~~ SOSY
6.0000 mg | PREFILLED_SYRINGE | Freq: Once | SUBCUTANEOUS | Status: AC
Start: 2020-11-08 — End: 2020-11-08
  Administered 2020-11-08: 6 mg via SUBCUTANEOUS

## 2020-11-08 MED ORDER — HEPARIN SOD (PORK) LOCK FLUSH 100 UNIT/ML IV SOLN
500.0000 [IU] | Freq: Once | INTRAVENOUS | Status: AC | PRN
  Administered 2020-11-08: 500 [IU]

## 2020-11-08 NOTE — Progress Notes (Signed)
Susan Martin returns today for port de access and flush after 46 hr continous infusion of 51fu. Tolerated infusion without problems. Portacath located right chest wall was  deaccessed and flushed with 70ml NS and 500U/11ml Heparin and needle removed intact.  Procedure without incident. Patient tolerated procedure well.

## 2020-11-08 NOTE — Patient Instructions (Signed)
[facility]  Discharge Instructions: Thank you for choosing [facility] to provide your oncology and hematology care.  If you have a lab appointment with the Cancer Center, please come in thru the Main Entrance and check in at the main information desk.  Wear comfortable clothing and clothing appropriate for easy access to any Portacath or PICC line.   We strive to give you quality time with your provider. You may need to reschedule your appointment if you arrive late (15 or more minutes).  Arriving late affects you and other patients whose appointments are after yours.  Also, if you miss three or more appointments without notifying the office, you may be dismissed from the clinic at the provider's discretion.      For prescription refill requests, have your pharmacy contact our office and allow 72 hours for refills to be completed.       To help prevent nausea and vomiting after your treatment, we encourage you to take your nausea medication as directed.   Items with * indicate a potential emergency and should be followed up as soon as possible or go to the Emergency Department if any problems should occur.  Please show the CHEMOTHERAPY ALERT CARD or IMMUNOTHERAPY ALERT CARD at check-in to the Emergency Department and triage nurse.  Should you have questions after your visit or need to cancel or reschedule your appointment, please contact [facility] [phone]  and follow the prompts.  Office hours are 8:00 a.m. to 4:30 p.m. Monday - Friday. Please note that voicemails left after 4:00 p.m. may not be returned until the following business day.  We are closed weekends and major holidays. You have access to a nurse at all times for urgent questions. Please call the main number to the clinic [phone] and follow the prompts.  For any non-urgent questions, you may also contact your provider using MyChart. We now offer e-Visits for anyone 18 and older to  request care online for non-urgent symptoms. For details visit [email or web address].   Also download the MyChart app! Go to the app store, search "MyChart", open the app, select [facility], and log in with your MyChart username and password.  Due to Covid, a mask is required upon entering the hospital/clinic. If you do not have a mask, one will be given to you upon arrival. For doctor visits, patients may have 1 support person aged 18 or older with them. For treatment visits, patients cannot have anyone with them due to current Covid guidelines and our immunocompromised population.  

## 2020-11-18 ENCOUNTER — Ambulatory Visit (HOSPITAL_COMMUNITY)
Admission: RE | Admit: 2020-11-18 | Discharge: 2020-11-18 | Disposition: A | Discharge status: Home / Self Care | Payer: Medicaid Other | Source: Ambulatory Visit | Attending: Hematology | Admitting: Hematology

## 2020-11-18 DIAGNOSIS — C189 Malignant neoplasm of colon, unspecified: Secondary | ICD-10-CM | POA: Diagnosis not present

## 2020-11-18 DIAGNOSIS — C787 Secondary malignant neoplasm of liver and intrahepatic bile duct: Secondary | ICD-10-CM | POA: Insufficient documentation

## 2020-11-18 MED ORDER — IOHEXOL 300 MG/ML  SOLN
100.0000 mL | Freq: Once | INTRAMUSCULAR | Status: AC | PRN
  Administered 2020-11-18: 100 mL via INTRAVENOUS

## 2020-11-19 NOTE — Progress Notes (Signed)
 [facility] [address] [place], [place] [address]   CLINIC:  Medical Oncology/Hematology  PCP:  Patient, No Pcp Per (Inactive) None None   REASON FOR VISIT:  Follow-up for metastatic colon cancer to liver  PRIOR THERAPY: Sigmoid colectomy and end colostomy on 04/29/2020  NGS Results: Foundation 1 KRAS wildtype, MS--stable, TMB 4 Muts/Mb  CURRENT THERAPY: FOLFOX, Avastin & Aloxi every 2 weeks  BRIEF ONCOLOGIC HISTORY:  Oncology History  Metastatic colon cancer to liver (HCC)  05/16/2020 Initial Diagnosis   Metastatic colon cancer to liver (HCC)   06/03/2020 Genetic Testing   Foundation One:     06/04/2020 -  Chemotherapy    Patient is on Treatment Plan: COLORECTAL FOLFOX + BEVACIZUMAB Q14D        CANCER STAGING: Cancer Staging No matching staging information was found for the patient.  INTERVAL HISTORY:  [name], a 59 y.o. female, returns for routine follow-up and consideration for next cycle of chemotherapy. Susan Martin was last seen on 11/06/2020.  Due for cycle #13 of FOLFOX, Avastin & Aloxi today.   Overall, she tells me she has been feeling pretty well. She reports mouth sores and cold sensitivity for 1 week following treatment. The sores decrease the amount of food consumed for the first few days after treatment due to discomfort. She has gained 9 lbs in 4 weeks, but she attributes this to increased appetite and craving for sweets. Sweets do not irritate the mouth sores and are more tolerable to consume. She denies any dysuria or increased frequency of urination. She reports occasional numbness and tingling in her fingers, but denies any weakness in her fingers. She also reports cold sensitivity for 1 week following treatment. She denies any abdominal pain.   Overall, she feels ready for next cycle of chemo today.   REVIEW OF SYSTEMS:  Review of Systems  Constitutional: Positive for appetite change (75%) and fatigue (50%).  HENT:    Positive for mouth sores (improved).   Genitourinary: Negative for dysuria and frequency.   Neurological: Positive for numbness.  All other systems reviewed and are negative.   PAST MEDICAL/SURGICAL HISTORY:  Past Medical History:  Diagnosis Date  . 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 katragadda   04-29-2020 emergerency surgery for perforated colon s/p sigmoid colectomy w/ colostomy; dx  Stage IV colon cancer mets to liver  . Anxiety   . Depression   . Hypertension    followed by dr ferguson and oncology  (05-27-2020 per pt does not have pcp yet)  . IDA (iron deficiency anemia)   . Pulmonary nodule, right    Past Surgical History:  Procedure Laterality Date  . ABDOMINAL HYSTERECTOMY  03-31-2016   @[facility]   W/  BILATERAL SALPINOOPHORECTOMY   . APPLICATION OF WOUND VAC N/A 04/29/2020   Procedure: APPLICATION OF WOUND VAC;  Surgeon: Kinsinger, Luke Aaron, MD;  Location: [facility] OR;  Service: General;  Laterality: N/A;  . ENDOMETRIAL ABLATION    . LAPAROTOMY N/A 04/29/2020   Procedure: EXPLORATORY LAPAROTOMY;  Surgeon: Kinsinger, Luke Aaron, MD;  Location: [facility] OR;  Service: General;  Laterality: N/A;  . PARTIAL COLECTOMY N/A 04/29/2020   Procedure: PARTIAL COLECTOMY WITH END COLOSTOMY;  Surgeon: Kinsinger, Luke Aaron, MD;  Location: [facility] OR;  Service: General;  Laterality: N/A;  . PORTACATH PLACEMENT Right 05/28/2020   Procedure: INSERTION PORT-A-CATH;  Surgeon: Kinsinger, Luke Aaron, MD;  Location: [facility];  Service: General;  Laterality: Right;  .   SALPINGOOPHORECTOMY Left 03/31/2016   Procedure: LEFT SALPINGO OOPHORECTOMY WITH FROZEN SECTION,  ABDOMINAL HYSTERECTOMY WITH BILATERAL SALPINGO-OOPHORECTOMY;  Surgeon: Jonnie Kind, MD;  Location: [facility];  Service: Gynecology;  Laterality: Left;    SOCIAL HISTORY:  Social History   Socioeconomic History  . Marital status: Divorced    Spouse name: Not on file  . Number of children: Not on file   . Years of education: Not on file  . Highest education level: Not on file  Occupational History  . Not on file  Tobacco Use  . Smoking status: Former Smoker    Packs/day: 0.50    Years: 5.00    Pack years: 2.50    Types: Cigarettes    Quit date: 03/28/2015    Years since quitting: 5.6  . Smokeless tobacco: Never Used  Vaping Use  . Vaping Use: Never used  Substance and Sexual Activity  . Alcohol use: No  . Drug use: Never  . Sexual activity: Yes    Partners: Male    Birth control/protection: Surgical  Other Topics Concern  . Not on file  Social History Narrative  . Not on file   Social Determinants of Health   Financial Resource Strain: Not on file  Food Insecurity: Not on file  Transportation Needs: Not on file  Physical Activity: Not on file  Stress: Not on file  Social Connections: Not on file  Intimate Partner Violence: Not on file    FAMILY HISTORY:  Family History  Problem Relation Age of Onset  . Hypertension Mother   . Diabetes Mother   . Cirrhosis Father     CURRENT MEDICATIONS:  Current Outpatient Medications  Medication Sig Dispense Refill  . BEVACIZUMAB IV Inject 5 mg/kg into the vein every 14 (fourteen) days.    . fluorouracil CALGB 94503 in sodium chloride 0.9 % 150 mL Inject 2,400 mg/m2 into the vein over 48 hr.    . hydrOXYzine (VISTARIL) 25 MG capsule Take 1 capsule (25 mg total) by mouth 3 (three) times daily as needed. Prn anxiety 60 capsule 2  . LEUCOVORIN CALCIUM IV Inject 400 mg/m2 into the vein every 21 ( twenty-one) days.    Marland Kitchen lidocaine-prilocaine (EMLA) cream Apply small amount to port a cath site and cover with plastic wrap 1 hour prior to chemotherapy appointments 30 g 3  . nystatin (MYCOSTATIN) 100000 UNIT/ML suspension Take by mouth.    . OXALIPLATIN IV Inject 85 mg/m2 into the vein every 14 (fourteen) days.    Marland Kitchen oxyCODONE (OXY IR/ROXICODONE) 5 MG immediate release tablet TAKE 1 TABLET BY MOUTH EVERY 6 HOURS AS NEEDED FOR MODERATE  PAIN. 120 tablet 0  . PARoxetine (PAXIL) 40 MG tablet Take 1 tablet (40 mg total) by mouth every morning. 90 tablet 3  . polyethylene glycol (MIRALAX / GLYCOLAX) packet Take 17 g by mouth every other day.    . prochlorperazine (COMPAZINE) 10 MG tablet Take 1 tablet (10 mg total) by mouth every 6 (six) hours as needed (Nausea or vomiting). 30 tablet 1  . triamterene-hydrochlorothiazide (MAXZIDE-25) 37.5-25 MG tablet Take 1 tablet by mouth daily.     No current facility-administered medications for this visit.    ALLERGIES:  No Known Allergies  PHYSICAL EXAM:  Performance status (ECOG): 1 - Symptomatic but completely ambulatory  There were no vitals filed for this visit. Wt Readings from Last 3 Encounters:  11/06/20 154 lb (69.9 kg)  10/25/20 156 lb (70.8 kg)  10/23/20 153 lb 9.6 oz (  69.7 kg)   Physical Exam Vitals reviewed.  Constitutional:      Appearance: Normal appearance.  Cardiovascular:     Rate and Rhythm: Normal rate and regular rhythm.     Pulses: Normal pulses.     Heart sounds: Normal heart sounds.  Pulmonary:     Effort: Pulmonary effort is normal.     Breath sounds: Normal breath sounds.  Musculoskeletal:     Right lower leg: No edema.     Left lower leg: No edema.  Neurological:     General: No focal deficit present.     Mental Status: She is alert and oriented to person, place, and time.  Psychiatric:        Mood and Affect: Mood normal.        Behavior: Behavior normal.     LABORATORY DATA:  I have reviewed the labs as listed.  CBC Latest Ref Rng & Units 11/06/2020 10/23/2020 10/08/2020  WBC 4.0 - 10.5 K/uL 9.2 11.5(H) 10.9(H)  Hemoglobin 12.0 - 15.0 g/dL 12.5 13.0 12.8  Hematocrit 36.0 - 46.0 % 38.3 39.1 38.3  Platelets 150 - 400 K/uL 97(L) 116(L) 142(L)   CMP Latest Ref Rng & Units 11/06/2020 10/23/2020 10/08/2020  Glucose 70 - 99 mg/dL 123(H) 108(H) 142(H)  BUN 6 - 20 mg/dL 11 12 14  Creatinine 0.44 - 1.00 mg/dL 0.65 0.70 0.67  Sodium 135 - 145  mmol/L 136 135 137  Potassium 3.5 - 5.1 mmol/L 3.6 3.6 3.5  Chloride 98 - 111 mmol/L 102 98 100  CO2 22 - 32 mmol/L 28 28 27  Calcium 8.9 - 10.3 mg/dL 8.6(L) 8.9 9.1  Total Protein 6.5 - 8.1 g/dL 7.1 7.1 6.9  Total Bilirubin 0.3 - 1.2 mg/dL 0.5 0.5 0.5  Alkaline Phos 38 - 126 U/L 151(H) 133(H) 149(H)  AST 15 - 41 U/L 26 24 29  ALT 0 - 44 U/L 27 23 32    DIAGNOSTIC IMAGING:  I have independently reviewed the scans and discussed with the patient. No results found.   ASSESSMENT:  1. Metastatic colon adenocarcinoma to liver: -Sigmoid colectomy and end colostomy on 04/29/2020 -Pathology pT4a,N1C (1 tumor deposit), 0/8 lymph nodes involved -MMR proficient, MSI-stable -Liver biopsy on May 03, 2020-adenocarcinoma consistent with colon primary. -CT chest on 05/02/2020 with no evidence of pulmonary metastatic disease. -CTAP on 05/07/2020 with multiple lesions throughout the liver, largest in the right lobe measuring 3.9 cm, lateral segment left lobe measuring 2.6 cm and inferior right lobe confluent lesion 4.2 x 3.7 cm. No lymphadenopathy. -CEA on 05/02/2020-60.2. -FOLFOX and bevacizumab started on 06/04/2020. -Foundation 1 testing shows MS-stable, KRAS/NRAS wild-type  2. Iron deficiency anemia: -Feraheme on 04/30/2020 and 05/06/2020.  3. 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 -Smoked for 3 to 4 years and quit. -Mother had cancer, type unknown. Maternal uncle had lung cancer. Maternal aunt had lung cancer.   PLAN:  1. Metastatic colon adenocarcinoma to liver, MS-stable: -We reviewed results of CT CAP from 11/18/2020 which showed stable exam with no significant changes in the bilobar hepatic metastasis.  No new lesions seen.  Multiple small bilateral pulmonary nodules stable, favored to be benign. - CEA was 2.1. - She reports that she can taste sweets better than other foods.  She gained about 9 pounds in the last 1 month. - We reviewed UA which showed  protein at 30. - We talked about discontinuing oxaliplatin and starting her on maintenance 5-FU and bevacizumab. - We reviewed her labs   today which showed normal LFTs.  She will proceed with her maintenance treatment today and in 2 weeks.  I plan to see her back in 4 weeks.  2. Iron deficiency anemia: -Hemoglobin improved to 12.0.  Parenteral iron therapy not indicated.  3. Abdominal pain: -Abdominal pain has improved since treatment started.  She is not requiring Percocet.  4. Peripheral neuropathy: -She has cold sensitivity lasting about 1 week.  Denies any tingling or numbness in extremities. - Oxaliplatin dose is already reduced by 20%. - We will discontinue oxaliplatin today.  5. Anxiety: -Continue hydroxyzine 25 mg 3 times daily as needed.   Orders placed this encounter:  No orders of the defined types were placed in this encounter.    Derek Jack, MD [facility] [phone]   I, Thana Ates, am acting as a scribe for Dr. Derek Jack.  I, Derek Jack MD, have reviewed the above documentation for accuracy and completeness, and I agree with the above.

## 2020-11-20 ENCOUNTER — Other Ambulatory Visit: Payer: Self-pay

## 2020-11-20 ENCOUNTER — Inpatient Hospital Stay (HOSPITAL_COMMUNITY): Payer: Medicaid Other

## 2020-11-20 ENCOUNTER — Inpatient Hospital Stay (HOSPITAL_BASED_OUTPATIENT_CLINIC_OR_DEPARTMENT_OTHER): Payer: Medicaid Other | Admitting: Hematology

## 2020-11-20 VITALS — BP 163/78 | HR 66 | Temp 97.0°F | Resp 17

## 2020-11-20 VITALS — BP 154/78 | HR 73 | Temp 96.9°F | Resp 18 | Wt 162.0 lb

## 2020-11-20 DIAGNOSIS — C189 Malignant neoplasm of colon, unspecified: Secondary | ICD-10-CM

## 2020-11-20 DIAGNOSIS — C787 Secondary malignant neoplasm of liver and intrahepatic bile duct: Secondary | ICD-10-CM

## 2020-11-20 DIAGNOSIS — Z5111 Encounter for antineoplastic chemotherapy: Secondary | ICD-10-CM | POA: Diagnosis not present

## 2020-11-20 LAB — URINALYSIS, DIPSTICK ONLY
Bilirubin Urine: NEGATIVE
Glucose, UA: NEGATIVE mg/dL
Hgb urine dipstick: NEGATIVE
Ketones, ur: 5 mg/dL — AB
Nitrite: NEGATIVE
Protein, ur: 30 mg/dL — AB
Specific Gravity, Urine: 1.024 (ref 1.005–1.030)
pH: 5 (ref 5.0–8.0)

## 2020-11-20 LAB — CBC WITH DIFFERENTIAL/PLATELET
Abs Immature Granulocytes: 0.07 10*3/uL (ref 0.00–0.07)
Basophils Absolute: 0 10*3/uL (ref 0.0–0.1)
Basophils Relative: 1 %
Eosinophils Absolute: 0.1 10*3/uL (ref 0.0–0.5)
Eosinophils Relative: 2 %
HCT: 36.3 % (ref 36.0–46.0)
Hemoglobin: 12 g/dL (ref 12.0–15.0)
Immature Granulocytes: 1 %
Lymphocytes Relative: 24 %
Lymphs Abs: 1.7 10*3/uL (ref 0.7–4.0)
MCH: 33.2 pg (ref 26.0–34.0)
MCHC: 33.1 g/dL (ref 30.0–36.0)
MCV: 100.6 fL — ABNORMAL HIGH (ref 80.0–100.0)
Monocytes Absolute: 0.8 10*3/uL (ref 0.1–1.0)
Monocytes Relative: 11 %
Neutro Abs: 4.5 10*3/uL (ref 1.7–7.7)
Neutrophils Relative %: 61 %
Platelets: 76 10*3/uL — ABNORMAL LOW (ref 150–400)
RBC: 3.61 MIL/uL — ABNORMAL LOW (ref 3.87–5.11)
RDW: 15.7 % — ABNORMAL HIGH (ref 11.5–15.5)
WBC: 7.2 10*3/uL (ref 4.0–10.5)
nRBC: 0 % (ref 0.0–0.2)

## 2020-11-20 LAB — COMPREHENSIVE METABOLIC PANEL
ALT: 18 U/L (ref 0–44)
AST: 22 U/L (ref 15–41)
Albumin: 3.1 g/dL — ABNORMAL LOW (ref 3.5–5.0)
Alkaline Phosphatase: 134 U/L — ABNORMAL HIGH (ref 38–126)
Anion gap: 4 — ABNORMAL LOW (ref 5–15)
BUN: 9 mg/dL (ref 6–20)
CO2: 27 mmol/L (ref 22–32)
Calcium: 8.2 mg/dL — ABNORMAL LOW (ref 8.9–10.3)
Chloride: 106 mmol/L (ref 98–111)
Creatinine, Ser: 0.62 mg/dL (ref 0.44–1.00)
GFR, Estimated: >60 mL/min (ref >60)
Glucose, Bld: 103 mg/dL — ABNORMAL HIGH (ref 70–99)
Potassium: 3.6 mmol/L (ref 3.5–5.1)
Sodium: 137 mmol/L (ref 135–145)
Total Bilirubin: 0.6 mg/dL (ref 0.3–1.2)
Total Protein: 6.9 g/dL (ref 6.5–8.1)

## 2020-11-20 LAB — MAGNESIUM: Magnesium: 2 mg/dL (ref 1.7–2.4)

## 2020-11-20 MED ORDER — SODIUM CHLORIDE 0.9 % IV SOLN
10.0000 mg | Freq: Once | INTRAVENOUS | Status: AC
  Administered 2020-11-20: 10 mg via INTRAVENOUS
  Filled 2020-11-20: qty 10

## 2020-11-20 MED ORDER — SODIUM CHLORIDE 0.9 % IV SOLN: Freq: Once | INTRAVENOUS | Status: AC

## 2020-11-20 MED ORDER — FLUOROURACIL CHEMO INJECTION 2.5 GM/50ML
320.0000 mg/m2 | Freq: Once | INTRAVENOUS | Status: AC
  Administered 2020-11-20: 550 mg via INTRAVENOUS
  Filled 2020-11-20: qty 11

## 2020-11-20 MED ORDER — BEVACIZUMAB-AWWB CHEMO INJECTION 400 MG/16ML
5.0000 mg/kg | Freq: Once | INTRAVENOUS | Status: AC
  Administered 2020-11-20: 350 mg via INTRAVENOUS
  Filled 2020-11-20: qty 14

## 2020-11-20 MED ORDER — PALONOSETRON HCL INJECTION 0.25 MG/5ML
0.2500 mg | Freq: Once | INTRAVENOUS | Status: AC
  Administered 2020-11-20: 0.25 mg via INTRAVENOUS

## 2020-11-20 MED ORDER — PALONOSETRON HCL INJECTION 0.25 MG/5ML
INTRAVENOUS | Status: AC
  Filled 2020-11-20: qty 5

## 2020-11-20 MED ORDER — HEPARIN SOD (PORK) LOCK FLUSH 100 UNIT/ML IV SOLN: 500.0000 [IU] | Freq: Once | INTRAVENOUS | Status: DC | PRN

## 2020-11-20 MED ORDER — SODIUM CHLORIDE 0.9 % IV SOLN
1920.0000 mg/m2 | INTRAVENOUS | Status: DC
  Administered 2020-11-20: 3250 mg via INTRAVENOUS
  Filled 2020-11-20: qty 65

## 2020-11-20 MED ORDER — SODIUM CHLORIDE 0.9% FLUSH: 10.0000 mL | INTRAVENOUS | Status: DC | PRN

## 2020-11-20 MED ORDER — SODIUM CHLORIDE 0.9 % IV SOLN
400.0000 mg/m2 | Freq: Once | INTRAVENOUS | Status: AC
  Administered 2020-11-20: 680 mg via INTRAVENOUS
  Filled 2020-11-20: qty 34

## 2020-11-20 NOTE — Patient Instructions (Signed)
Susan Martin  Discharge Instructions: Thank you for choosing [facility] to provide your oncology and hematology care.  If you have a lab appointment with the [facility], please come in thru the Main Entrance and check in at the main information desk.  Wear comfortable clothing and clothing appropriate for easy access to any Portacath or PICC line.   We strive to give you quality time with your provider. You may need to reschedule your appointment if you arrive late (15 or more minutes).  Arriving late affects you and other patients whose appointments are after yours.  Also, if you miss three or more appointments without notifying the office, you may be dismissed from the clinic at the provider's discretion.      For prescription refill requests, have your pharmacy contact our office and allow 72 hours for refills to be completed.    Today you received the following chemotherapy and/or immunotherapy agents Avastin/FOLFOX/5FU     To help prevent nausea and vomiting after your treatment, we encourage you to take your nausea medication as directed.  BELOW ARE SYMPTOMS THAT SHOULD BE REPORTED IMMEDIATELY: . *FEVER GREATER THAN 100.4 F (38 C) OR HIGHER . *CHILLS OR SWEATING . *NAUSEA AND VOMITING THAT IS NOT CONTROLLED WITH YOUR NAUSEA MEDICATION . *UNUSUAL SHORTNESS OF BREATH . *UNUSUAL BRUISING OR BLEEDING . *URINARY PROBLEMS (pain or burning when urinating, or frequent urination) . *BOWEL PROBLEMS (unusual diarrhea, constipation, pain near the anus) . TENDERNESS IN MOUTH AND THROAT WITH OR WITHOUT PRESENCE OF ULCERS (sore throat, sores in mouth, or a toothache) . UNUSUAL RASH, SWELLING OR PAIN  . UNUSUAL VAGINAL DISCHARGE OR ITCHING   Items with * indicate a potential emergency and should be followed up as soon as possible or go to the Emergency Department if any problems should occur.  Please show the CHEMOTHERAPY ALERT CARD or IMMUNOTHERAPY ALERT CARD at  check-in to the Emergency Department and triage nurse.  Should you have questions after your visit or need to cancel or reschedule your appointment, please contact [facility] [phone]  and follow the prompts.  Office hours are 8:00 a.m. to 4:30 p.m. Monday - Friday. Please note that voicemails left after 4:00 p.m. may not be returned until the following business day.  We are closed weekends and major holidays. You have access to a nurse at all times for urgent questions. Please call the main number to the clinic [phone] and follow the prompts.  For any non-urgent questions, you may also contact your provider using MyChart. We now offer e-Visits for anyone 58 and older to request care online for non-urgent symptoms. For details visit [email or web address].   Also download the MyChart app! Go to the app store, search "MyChart", open the app, select [facility], and log in with your MyChart username and password.  Due to Covid, a mask is required upon entering the hospital/clinic. If you do not have a mask, one will be given to you upon arrival. For doctor visits, patients may have 1 support person aged 77 or older with them. For treatment visits, patients cannot have anyone with them due to current Covid guidelines and our immunocompromised population.

## 2020-11-20 NOTE — Progress Notes (Signed)
Patient was assessed by Dr. Delton Coombes and labs have been reviewed.  Patient is okay to proceed with treatment today pending CBC/Diff.  Aware of protein.  Removed oxaliplaitin from treatment plan due to neuropathy. Primary RN and pharmacy aware.

## 2020-11-20 NOTE — Progress Notes (Signed)
Patient presents today for Avastin/FOLFOX with 5FU pump start.  Vital signs within parameters for treatment.  Labs pending.  Patients only complaint is neuropathy.  Oxaliplatin removed from treatment plan by MD due to neuropathy.  Labs reviewed and platelets noted to be 76.  Dr. Delton Coombes notified.  Message from Dr. Delton Coombes, Patient okay for treatment.  Avastin/Leucovorin/Fluorouracil/5FU pump start given today per MD orders.  Stable during infusion without adverse affects. RUN verified on the pump screen with patient.  Vital signs stable.  No complaints at this time.  Discharge from clinic ambulatory in stable condition.  Alert and oriented X 3.  Follow up with [facility] as scheduled.

## 2020-11-20 NOTE — Progress Notes (Signed)
Discontinue Udenyca per Dr Delton Coombes.  T.O. Dr Rhys Martini, PharmD

## 2020-11-20 NOTE — Patient Instructions (Signed)
[facility] Discharge Instructions  You were seen today by Dr. Katragadda. He went over your recent results. Dr. Katragadda will see you back in for labs and follow up.   Thank you for choosing  Can[facility] Center at [facility] to provide your oncology and hematology care.  To afford each patient quality time with our provider, please arrive at least 15 minutes before your scheduled appointment time.   If you have a lab appointment with the Cancer Center please come in thru the Main Entrance and check in at the main information desk  You need to re-schedule your appointment should you arrive 10 or more minutes late.  We strive to give you quality time with our providers, and arriving late affects you and other patients whose appointments are after yours.  Also, if you no show three or more times for appointments you may be dismissed from the clinic at the providers discretion.     Again, thank you for choosing [facility].  Our hope is that these requests will decrease the amount of time that you wait before being seen by our physicians.       _____________________________________________________________  Should you have questions after your visit to [facility], please contact our office at [phone] between the hours of 8:00 a.m. and 4:30 p.m.  Voicemails left after 4:00 p.m. will not be returned until the following business day.  For prescription refill requests, have your pharmacy contact our office and allow 72 hours.    Cancer Center Support Programs:   > Cancer Support Group  2nd Tuesday of the month 1pm-2pm, Journey Room    

## 2020-11-21 ENCOUNTER — Other Ambulatory Visit: Payer: Self-pay | Admitting: Hematology

## 2020-11-22 ENCOUNTER — Inpatient Hospital Stay (HOSPITAL_COMMUNITY): Payer: Medicaid Other

## 2020-11-22 ENCOUNTER — Other Ambulatory Visit: Payer: Self-pay

## 2020-11-22 VITALS — BP 148/84 | HR 64 | Temp 97.7°F | Resp 18

## 2020-11-22 DIAGNOSIS — C189 Malignant neoplasm of colon, unspecified: Secondary | ICD-10-CM

## 2020-11-22 DIAGNOSIS — C787 Secondary malignant neoplasm of liver and intrahepatic bile duct: Secondary | ICD-10-CM

## 2020-11-22 DIAGNOSIS — Z5111 Encounter for antineoplastic chemotherapy: Secondary | ICD-10-CM | POA: Diagnosis not present

## 2020-11-22 MED ORDER — PEGFILGRASTIM-CBQV 6 MG/0.6ML ~~LOC~~ SOSY
PREFILLED_SYRINGE | SUBCUTANEOUS | Status: AC
  Filled 2020-11-22: qty 0.6

## 2020-11-22 MED ORDER — HEPARIN SOD (PORK) LOCK FLUSH 100 UNIT/ML IV SOLN
500.0000 [IU] | Freq: Once | INTRAVENOUS | Status: AC | PRN
Start: 2020-11-22 — End: 2020-11-22
  Administered 2020-11-22: 500 [IU]

## 2020-11-22 MED ORDER — SODIUM CHLORIDE 0.9% FLUSH
10.0000 mL | INTRAVENOUS | Status: DC | PRN
  Administered 2020-11-22: 10 mL

## 2020-11-22 NOTE — Patient Instructions (Signed)
[facility]  Discharge Instructions: Thank you for choosing [facility] to provide your oncology and hematology care.  If you have a lab appointment with the Cancer Center, please come in thru the Main Entrance and check in at the main information desk.  Wear comfortable clothing and clothing appropriate for easy access to any Portacath or PICC line.   We strive to give you quality time with your provider. You may need to reschedule your appointment if you arrive late (15 or more minutes).  Arriving late affects you and other patients whose appointments are after yours.  Also, if you miss three or more appointments without notifying the office, you may be dismissed from the clinic at the provider's discretion.      For prescription refill requests, have your pharmacy contact our office and allow 72 hours for refills to be completed.        To help prevent nausea and vomiting after your treatment, we encourage you to take your nausea medication as directed.  BELOW ARE SYMPTOMS THAT SHOULD BE REPORTED IMMEDIATELY: *FEVER GREATER THAN 100.4 F (38 C) OR HIGHER *CHILLS OR SWEATING *NAUSEA AND VOMITING THAT IS NOT CONTROLLED WITH YOUR NAUSEA MEDICATION *UNUSUAL SHORTNESS OF BREATH *UNUSUAL BRUISING OR BLEEDING *URINARY PROBLEMS (pain or burning when urinating, or frequent urination) *BOWEL PROBLEMS (unusual diarrhea, constipation, pain near the anus) TENDERNESS IN MOUTH AND THROAT WITH OR WITHOUT PRESENCE OF ULCERS (sore throat, sores in mouth, or a toothache) UNUSUAL RASH, SWELLING OR PAIN  UNUSUAL VAGINAL DISCHARGE OR ITCHING   Items with * indicate a potential emergency and should be followed up as soon as possible or go to the Emergency Department if any problems should occur.  Please show the CHEMOTHERAPY ALERT CARD or IMMUNOTHERAPY ALERT CARD at check-in to the Emergency Department and triage nurse.  Should you have questions after your visit or need to cancel  or reschedule your appointment, please contact [facility] [phone]  and follow the prompts.  Office hours are 8:00 a.m. to 4:30 p.m. Monday - Friday. Please note that voicemails left after 4:00 p.m. may not be returned until the following business day.  We are closed weekends and major holidays. You have access to a nurse at all times for urgent questions. Please call the main number to the clinic [phone] and follow the prompts.  For any non-urgent questions, you may also contact your provider using MyChart. We now offer e-Visits for anyone 18 and older to request care online for non-urgent symptoms. For details visit [email or web address].   Also download the MyChart app! Go to the app store, search "MyChart", open the app, select [facility], and log in with your MyChart username and password.  Due to Covid, a mask is required upon entering the hospital/clinic. If you do not have a mask, one will be given to you upon arrival. For doctor visits, patients may have 1 support person aged 18 or older with them. For treatment visits, patients cannot have anyone with them due to current Covid guidelines and our immunocompromised population.  

## 2020-11-22 NOTE — Progress Notes (Signed)
Susan Martin presents today for PORT de access after 46 hours continuous infusion of 5FU.  Tolerated infusion without problems.  Patients port flushed without difficulty.  Good blood return noted with no bruising or swelling noted at site.  Band aid applied.  VSS with discharge and left in satisfactory condition with no s/s of distress noted.

## 2020-11-25 ENCOUNTER — Encounter (HOSPITAL_COMMUNITY): Payer: Self-pay | Admitting: Hematology

## 2020-12-04 ENCOUNTER — Encounter (HOSPITAL_COMMUNITY): Payer: Self-pay | Admitting: Hematology and Oncology

## 2020-12-04 ENCOUNTER — Other Ambulatory Visit: Payer: Self-pay

## 2020-12-04 ENCOUNTER — Inpatient Hospital Stay (HOSPITAL_COMMUNITY): Payer: Medicaid Other | Attending: Hematology

## 2020-12-04 ENCOUNTER — Inpatient Hospital Stay (HOSPITAL_COMMUNITY): Payer: Medicaid Other

## 2020-12-04 ENCOUNTER — Inpatient Hospital Stay (HOSPITAL_BASED_OUTPATIENT_CLINIC_OR_DEPARTMENT_OTHER): Payer: Medicaid Other | Admitting: Hematology and Oncology

## 2020-12-04 VITALS — BP 165/81 | HR 77 | Temp 97.6°F | Resp 16

## 2020-12-04 DIAGNOSIS — I700 Atherosclerosis of aorta: Secondary | ICD-10-CM | POA: Insufficient documentation

## 2020-12-04 DIAGNOSIS — D509 Iron deficiency anemia, unspecified: Secondary | ICD-10-CM | POA: Diagnosis not present

## 2020-12-04 DIAGNOSIS — R911 Solitary pulmonary nodule: Secondary | ICD-10-CM | POA: Insufficient documentation

## 2020-12-04 DIAGNOSIS — R918 Other nonspecific abnormal finding of lung field: Secondary | ICD-10-CM | POA: Diagnosis not present

## 2020-12-04 DIAGNOSIS — C189 Malignant neoplasm of colon, unspecified: Secondary | ICD-10-CM

## 2020-12-04 DIAGNOSIS — Z933 Colostomy status: Secondary | ICD-10-CM | POA: Diagnosis not present

## 2020-12-04 DIAGNOSIS — G620 Drug-induced polyneuropathy: Secondary | ICD-10-CM

## 2020-12-04 DIAGNOSIS — T451X5A Adverse effect of antineoplastic and immunosuppressive drugs, initial encounter: Secondary | ICD-10-CM | POA: Insufficient documentation

## 2020-12-04 DIAGNOSIS — K0889 Other specified disorders of teeth and supporting structures: Secondary | ICD-10-CM | POA: Insufficient documentation

## 2020-12-04 DIAGNOSIS — K1379 Other lesions of oral mucosa: Secondary | ICD-10-CM

## 2020-12-04 DIAGNOSIS — D696 Thrombocytopenia, unspecified: Secondary | ICD-10-CM

## 2020-12-04 DIAGNOSIS — T50905A Adverse effect of unspecified drugs, medicaments and biological substances, initial encounter: Secondary | ICD-10-CM

## 2020-12-04 DIAGNOSIS — C20 Malignant neoplasm of rectum: Secondary | ICD-10-CM | POA: Insufficient documentation

## 2020-12-04 DIAGNOSIS — Z79899 Other long term (current) drug therapy: Secondary | ICD-10-CM | POA: Insufficient documentation

## 2020-12-04 DIAGNOSIS — C787 Secondary malignant neoplasm of liver and intrahepatic bile duct: Secondary | ICD-10-CM | POA: Diagnosis not present

## 2020-12-04 DIAGNOSIS — Z5111 Encounter for antineoplastic chemotherapy: Secondary | ICD-10-CM | POA: Diagnosis not present

## 2020-12-04 DIAGNOSIS — Z87891 Personal history of nicotine dependence: Secondary | ICD-10-CM | POA: Diagnosis not present

## 2020-12-04 DIAGNOSIS — E876 Hypokalemia: Secondary | ICD-10-CM | POA: Insufficient documentation

## 2020-12-04 LAB — COMPREHENSIVE METABOLIC PANEL
ALT: 30 U/L (ref 0–44)
AST: 39 U/L (ref 15–41)
Albumin: 3.4 g/dL — ABNORMAL LOW (ref 3.5–5.0)
Alkaline Phosphatase: 150 U/L — ABNORMAL HIGH (ref 38–126)
Anion gap: 7 (ref 5–15)
BUN: 12 mg/dL (ref 6–20)
CO2: 28 mmol/L (ref 22–32)
Calcium: 8.8 mg/dL — ABNORMAL LOW (ref 8.9–10.3)
Chloride: 101 mmol/L (ref 98–111)
Creatinine, Ser: 0.63 mg/dL (ref 0.44–1.00)
GFR, Estimated: >60 mL/min (ref >60)
Glucose, Bld: 90 mg/dL (ref 70–99)
Potassium: 3 mmol/L — ABNORMAL LOW (ref 3.5–5.1)
Sodium: 136 mmol/L (ref 135–145)
Total Bilirubin: 0.5 mg/dL (ref 0.3–1.2)
Total Protein: 7.2 g/dL (ref 6.5–8.1)

## 2020-12-04 LAB — CBC WITH DIFFERENTIAL/PLATELET
Abs Immature Granulocytes: 0.02 10*3/uL (ref 0.00–0.07)
Basophils Absolute: 0.1 10*3/uL (ref 0.0–0.1)
Basophils Relative: 1 %
Eosinophils Absolute: 0.2 10*3/uL (ref 0.0–0.5)
Eosinophils Relative: 3 %
HCT: 36.8 % (ref 36.0–46.0)
Hemoglobin: 12.2 g/dL (ref 12.0–15.0)
Immature Granulocytes: 0 %
Lymphocytes Relative: 44 %
Lymphs Abs: 3.6 10*3/uL (ref 0.7–4.0)
MCH: 32.9 pg (ref 26.0–34.0)
MCHC: 33.2 g/dL (ref 30.0–36.0)
MCV: 99.2 fL (ref 80.0–100.0)
Monocytes Absolute: 0.9 10*3/uL (ref 0.1–1.0)
Monocytes Relative: 11 %
Neutro Abs: 3.3 10*3/uL (ref 1.7–7.7)
Neutrophils Relative %: 41 %
Platelets: 131 10*3/uL — ABNORMAL LOW (ref 150–400)
RBC: 3.71 MIL/uL — ABNORMAL LOW (ref 3.87–5.11)
RDW: 14.9 % (ref 11.5–15.5)
WBC: 8.1 10*3/uL (ref 4.0–10.5)
nRBC: 0 % (ref 0.0–0.2)

## 2020-12-04 MED ORDER — POTASSIUM CHLORIDE CRYS ER 20 MEQ PO TBCR: 20.0000 meq | EXTENDED_RELEASE_TABLET | Freq: Every day | ORAL | 0 refills | Status: DC

## 2020-12-04 MED ORDER — PALONOSETRON HCL INJECTION 0.25 MG/5ML
0.2500 mg | Freq: Once | INTRAVENOUS | Status: AC
Start: 2020-12-04 — End: 2020-12-04
  Administered 2020-12-04: 0.25 mg via INTRAVENOUS

## 2020-12-04 MED ORDER — SODIUM CHLORIDE 0.9 % IV SOLN
400.0000 mg/m2 | Freq: Once | INTRAVENOUS | Status: AC
  Administered 2020-12-04: 680 mg via INTRAVENOUS
  Filled 2020-12-04: qty 34

## 2020-12-04 MED ORDER — SODIUM CHLORIDE 0.9 % IV SOLN: Freq: Once | INTRAVENOUS | Status: AC

## 2020-12-04 MED ORDER — SODIUM CHLORIDE 0.9% FLUSH
10.0000 mL | INTRAVENOUS | Status: DC | PRN
  Administered 2020-12-04: 10 mL

## 2020-12-04 MED ORDER — SODIUM CHLORIDE 0.9 % IV SOLN
5.0000 mg/kg | Freq: Once | INTRAVENOUS | Status: AC
  Administered 2020-12-04: 350 mg via INTRAVENOUS
  Filled 2020-12-04: qty 14

## 2020-12-04 MED ORDER — SODIUM CHLORIDE 0.9 % IV SOLN
1920.0000 mg/m2 | INTRAVENOUS | Status: DC
  Administered 2020-12-04: 3250 mg via INTRAVENOUS
  Filled 2020-12-04: qty 65

## 2020-12-04 MED ORDER — DEXAMETHASONE SODIUM PHOSPHATE 100 MG/10ML IJ SOLN
10.0000 mg | Freq: Once | INTRAMUSCULAR | Status: AC
  Administered 2020-12-04: 10 mg via INTRAVENOUS
  Filled 2020-12-04: qty 10

## 2020-12-04 MED ORDER — PALONOSETRON HCL INJECTION 0.25 MG/5ML
INTRAVENOUS | Status: AC
  Filled 2020-12-04: qty 5

## 2020-12-04 MED ORDER — HEPARIN SOD (PORK) LOCK FLUSH 100 UNIT/ML IV SOLN: 500.0000 [IU] | Freq: Once | INTRAVENOUS | Status: DC | PRN

## 2020-12-04 MED ORDER — FLUOROURACIL CHEMO INJECTION 2.5 GM/50ML
320.0000 mg/m2 | Freq: Once | INTRAVENOUS | Status: AC
  Administered 2020-12-04: 550 mg via INTRAVENOUS
  Filled 2020-12-04: qty 11

## 2020-12-04 MED ORDER — DEXTROSE 5 % IV SOLN: Freq: Once | INTRAVENOUS | Status: DC

## 2020-12-04 NOTE — Assessment & Plan Note (Signed)
This is improved since discontinuation of oxaliplatin Monitor close

## 2020-12-04 NOTE — Assessment & Plan Note (Signed)
Likely caused by her diuretic therapy Recommend short course of oral potassium supplement

## 2020-12-04 NOTE — Progress Notes (Signed)
Patient tolerated chemotherapy with no complaints voiced. Side effects with management reviewed understanding verbalized. 5FU Pump started. Patient left in satisfactory condition with VSS and no s/s of distress noted.

## 2020-12-04 NOTE — Assessment & Plan Note (Signed)
She tolerated last cycle of treatment better since discontinuation of oxaliplatin The neuropathy is stable/improved Her main complaint is sensitivity in her mouth and gums, and related to side effects of treatment She has appointment to see dentist and the prescribed amoxicillin is helping We will proceed with treatment as scheduled

## 2020-12-04 NOTE — Patient Instructions (Signed)
Susan Martin  Discharge Instructions: Thank you for choosing [facility] to provide your oncology and hematology care.  If you have a lab appointment with the [facility], please come in thru the Main Entrance and check in at the main information desk.  Wear comfortable clothing and clothing appropriate for easy access to any Portacath or PICC line.   We strive to give you quality time with your provider. You may need to reschedule your appointment if you arrive late (15 or more minutes).  Arriving late affects you and other patients whose appointments are after yours.  Also, if you miss three or more appointments without notifying the office, you may be dismissed from the clinic at the provider's discretion.      For prescription refill requests, have your pharmacy contact our office and allow 72 hours for refills to be completed.    Today you received the following chemotherapy and/or immunotherapy agents Avastin/Folfox, 5FU pump start.   To help prevent nausea and vomiting after your treatment, we encourage you to take your nausea medication as directed.  BELOW ARE SYMPTOMS THAT SHOULD BE REPORTED IMMEDIATELY: . *FEVER GREATER THAN 100.4 F (38 C) OR HIGHER . *CHILLS OR SWEATING . *NAUSEA AND VOMITING THAT IS NOT CONTROLLED WITH YOUR NAUSEA MEDICATION . *UNUSUAL SHORTNESS OF BREATH . *UNUSUAL BRUISING OR BLEEDING . *URINARY PROBLEMS (pain or burning when urinating, or frequent urination) . *BOWEL PROBLEMS (unusual diarrhea, constipation, pain near the anus) . TENDERNESS IN MOUTH AND THROAT WITH OR WITHOUT PRESENCE OF ULCERS (sore throat, sores in mouth, or a toothache) . UNUSUAL RASH, SWELLING OR PAIN  . UNUSUAL VAGINAL DISCHARGE OR ITCHING   Items with * indicate a potential emergency and should be followed up as soon as possible or go to the Emergency Department if any problems should occur.  Please show the CHEMOTHERAPY ALERT CARD or IMMUNOTHERAPY ALERT  CARD at check-in to the Emergency Department and triage nurse.  Should you have questions after your visit or need to cancel or reschedule your appointment, please contact [facility] [phone]  and follow the prompts.  Office hours are 8:00 a.m. to 4:30 p.m. Monday - Friday. Please note that voicemails left after 4:00 p.m. may not be returned until the following business day.  We are closed weekends and major holidays. You have access to a nurse at all times for urgent questions. Please call the main number to the clinic [phone] and follow the prompts.  For any non-urgent questions, you may also contact your provider using MyChart. We now offer e-Visits for anyone 66 and older to request care online for non-urgent symptoms. For details visit [email or web address].   Also download the MyChart app! Go to the app store, search "MyChart", open the app, select [facility], and log in with your MyChart username and password.  Due to Covid, a mask is required upon entering the hospital/clinic. If you do not have a mask, one will be given to you upon arrival. For doctor visits, patients may have 1 support person aged 29 or older with them. For treatment visits, patients cannot have anyone with them due to current Covid guidelines and our immunocompromised population.

## 2020-12-04 NOTE — Progress Notes (Signed)
[facility] FOLLOW-UP progress notes  Patient Care Team: Patient, No Pcp Per (Inactive) as PCP - General (General Practice) Brien Mates, RN as Oncology Nurse Navigator (Oncology)  CHIEF COMPLAINTS/PURPOSE OF VISIT:  Colon cancer, for further treatment  HISTORY OF PRESENTING ILLNESS:  Susan Martin 60 y.o. female  seen because her oncologist is not present today From her last treatment, oxaliplatin was discontinued Her neuropathy is better She had no cold sensitivity at this time She is complaining of oral dental pain She was prescribed amoxicillin She denies difficulties with eating No recent nausea, vomiting or changes in bowel habits The patient denies any recent signs or symptoms of bleeding such as spontaneous epistaxis, hematuria or hematochezia.  I reviewed the patient's records extensive and collaborated the history with the patient. Summary of her history is as follows: Oncology History  Metastatic colon cancer to liver ([place] [facility])  05/16/2020 Initial Diagnosis   Metastatic colon cancer to liver ([facility])   06/03/2020 Genetic Testing   [facility]:     06/04/2020 -  Chemotherapy    Patient is on Treatment Plan: COLORECTAL FOLFOX + BEVACIZUMAB Q14D        MEDICAL HISTORY:  Past Medical History:  Diagnosis Date  . 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 Delton Coombes   04-29-2020 [facility] surgery for perforated colon s/p sigmoid colectomy w/ colostomy; dx  Stage IV colon cancer mets to liver  . Anxiety   . Depression   . Hypertension    followed by dr Glo Herring and oncology  (05-27-2020 per pt does not have pcp yet)  . IDA (iron deficiency anemia)   . Pulmonary nodule, right     SURGICAL HISTORY: Past Surgical History:  Procedure Laterality Date  . ABDOMINAL HYSTERECTOMY  03-31-2016   @[facility]    W/  BILATERAL SALPINOOPHORECTOMY   . APPLICATION OF WOUND VAC N/A 04/29/2020   Procedure: APPLICATION OF WOUND VAC;  Surgeon:  Kinsinger, Arta Bruce, MD;  Location: [facility];  Service: General;  Laterality: N/A;  . ENDOMETRIAL ABLATION    . LAPAROTOMY N/A 04/29/2020   Procedure: EXPLORATORY LAPAROTOMY;  Surgeon: Kieth Brightly Arta Bruce, MD;  Location: [facility];  Service: General;  Laterality: N/A;  . PARTIAL COLECTOMY N/A 04/29/2020   Procedure: PARTIAL COLECTOMY WITH END COLOSTOMY;  Surgeon: Kieth Brightly Arta Bruce, MD;  Location: [facility];  Service: General;  Laterality: N/A;  . PORTACATH PLACEMENT Right 05/28/2020   Procedure: INSERTION PORT-A-CATH;  Surgeon: Mickeal Skinner, MD;  Location: [facility];  Service: General;  Laterality: Right;  . SALPINGOOPHORECTOMY Left 03/31/2016   Procedure: LEFT SALPINGO OOPHORECTOMY WITH FROZEN SECTION,  ABDOMINAL HYSTERECTOMY WITH BILATERAL SALPINGO-OOPHORECTOMY;  Surgeon: Jonnie Kind, MD;  Location: [facility];  Service: Gynecology;  Laterality: Left;    SOCIAL HISTORY: Social History   Socioeconomic History  . Marital status: Divorced    Spouse name: Not on file  . Number of children: Not on file  . Years of education: Not on file  . Highest education level: Not on file  Occupational History  . Not on file  Tobacco Use  . Smoking status: Former Smoker    Packs/day: 0.50    Years: 5.00    Pack years: 2.50    Types: Cigarettes    Quit date: 03/28/2015    Years since quitting: 5.6  . Smokeless tobacco: Never Used  Vaping Use  . Vaping Use: Never used  Substance and Sexual Activity  . Alcohol use: No  .  Drug use: Never  . Sexual activity: Yes    Partners: Male    Birth control/protection: Surgical  Other Topics Concern  . Not on file  Social History Narrative  . Not on file   Social Determinants of Health   Financial Resource Strain: Not on file  Food Insecurity: Not on file  Transportation Needs: Not on file  Physical Activity: Not on file  Stress: Not on file  Social Connections: Not on file  Intimate Partner Violence: Not on file    FAMILY  HISTORY: Family History  Problem Relation Age of Onset  . Hypertension Mother   . Diabetes Mother   . Cirrhosis Father     ALLERGIES:  has No Known Allergies.  MEDICATIONS:  Current Outpatient Medications  Medication Sig Dispense Refill  . amoxicillin (AMOXIL) 500 MG capsule Take 500 mg by mouth 3 (three) times daily.    Marland Kitchen BEVACIZUMAB IV Inject 5 mg/kg into the vein every 14 (fourteen) days.    . fluorouracil CALGB 70263 in sodium chloride 0.9 % 150 mL Inject 2,400 mg/m2 into the vein over 48 hr.    . LEUCOVORIN CALCIUM IV Inject 400 mg/m2 into the vein every 21 ( twenty-one) days.    Marland Kitchen nystatin (MYCOSTATIN) 100000 UNIT/ML suspension Take by mouth.    . OXALIPLATIN IV Inject 85 mg/m2 into the vein every 14 (fourteen) days.    Marland Kitchen PARoxetine (PAXIL) 40 MG tablet Take 1 tablet (40 mg total) by mouth every morning. 90 tablet 3  . polyethylene glycol (MIRALAX / GLYCOLAX) packet Take 17 g by mouth every other day.    . potassium chloride SA (KLOR-CON) 20 MEQ tablet Take 1 tablet (20 mEq total) by mouth daily. 30 tablet 0  . triamterene-hydrochlorothiazide (MAXZIDE-25) 37.5-25 MG tablet TAKE 1 TABLET ONCE DAILY. 30 tablet 0  . hydrOXYzine (VISTARIL) 25 MG capsule Take 1 capsule (25 mg total) by mouth 3 (three) times daily as needed. Prn anxiety (Patient not taking: Reported on 12/04/2020) 60 capsule 2  . lidocaine-prilocaine (EMLA) cream Apply small amount to port a cath site and cover with plastic wrap 1 hour prior to chemotherapy appointments (Patient not taking: Reported on 12/04/2020) 30 g 3  . prochlorperazine (COMPAZINE) 10 MG tablet Take 1 tablet (10 mg total) by mouth every 6 (six) hours as needed (Nausea or vomiting). (Patient not taking: Reported on 12/04/2020) 30 tablet 1   No current facility-administered medications for this visit.    REVIEW OF SYSTEMS:   Constitutional: Denies fevers, chills or abnormal night sweats Eyes: Denies blurriness of vision, double vision or watery  eyes Ears, nose, mouth, throat, and face: Denies mucositis or sore throat Respiratory: Denies cough, dyspnea or wheezes Cardiovascular: Denies palpitation, chest discomfort or lower extremity swelling Skin: Denies abnormal skin rashes Lymphatics: Denies new lymphadenopathy or easy bruising Neurological:Denies numbness, tingling or new weaknesses Behavioral/Psych: Mood is stable, no new changes  All other systems were reviewed with the patient and are negative.  PHYSICAL EXAMINATION: ECOG PERFORMANCE STATUS: 1 - Symptomatic but completely ambulatory  Vitals:   12/04/20 0825  BP: 137/60  Pulse: 72  Resp: 18  Temp: (!) 97.2 F (36.2 C)  SpO2: 98%   Filed Weights   12/04/20 0825  Weight: 152 lb (68.9 kg)    GENERAL:alert, no distress and comfortable SKIN: skin color, texture, turgor are normal, no rashes or significant lesions EYES: normal, conjunctiva are pink and non-injected, sclera clear OROPHARYNX:no exudate, normal lips, buccal mucosa, and tongue  NECK:  supple, thyroid normal size, non-tender, without nodularity LYMPH:  no palpable lymphadenopathy in the cervical, axillary or inguinal LUNGS: clear to auscultation and percussion with normal breathing effort HEART: regular rate & rhythm and no murmurs without lower extremity edema ABDOMEN:abdomen soft, non-tender and normal bowel sounds Musculoskeletal:no cyanosis of digits and no clubbing  PSYCH: alert & oriented x 3 with fluent speech NEURO: no focal motor/sensory deficits  LABORATORY DATA:  I have reviewed the data as listed Lab Results  Component Value Date   WBC 8.1 12/04/2020   HGB 12.2 12/04/2020   HCT 36.8 12/04/2020   MCV 99.2 12/04/2020   PLT 131 (L) 12/04/2020   Recent Labs    11/06/20 0827 11/20/20 0826 12/04/20 0805  NA 136 137 136  K 3.6 3.6 3.0*  CL 102 106 101  CO2 28 27 28   GLUCOSE 123* 103* 90  BUN 11 9 12   CREATININE 0.65 0.62 0.63  CALCIUM 8.6* 8.2* 8.8*  GFRNONAA >60 >60 >60  PROT  7.1 6.9 7.2  ALBUMIN 3.3* 3.1* 3.4*  AST 26 22 39  ALT 27 18 30   ALKPHOS 151* 134* 150*  BILITOT 0.5 0.6 0.5    RADIOGRAPHIC STUDIES: I have personally reviewed the radiological images as listed and agreed with the findings in the report. CT CHEST ABDOMEN PELVIS W CONTRAST  Result Date: 11/19/2020 CLINICAL DATA:  History of colorectal cancer with metastatic disease to the liver, follow-up EXAM: CT CHEST, ABDOMEN, AND PELVIS WITH CONTRAST TECHNIQUE: Multidetector CT imaging of the chest, abdomen and pelvis was performed following the standard protocol during bolus administration of intravenous contrast. CONTRAST:  170mL OMNIPAQUE IOHEXOL 300 MG/ML  SOLN COMPARISON:  Multiple priors including most recent CT chest abdomen and pelvis August 27, 2020. FINDINGS: CT CHEST FINDINGS Cardiovascular: Right chest Port-A-Cath with tip at the superior cavoatrial junction. No thoracic aortic aneurysm. No central pulmonary embolus. Calcifications of the aortic valve. Normal size heart. No significant pericardial effusion/thickening. Mediastinum/Nodes: No pathologically enlarged mediastinal, hilar or axillary lymph nodes. Trachea esophagus are grossly unremarkable. Hypodense 5 mm left thyroid nodule. Not clinically significant; no follow-up imaging recommended (ref: J Am Coll Radiol. 2015 Feb;12(2): 143-50). Lungs/Pleura: Again seen are multiple tiny bilateral pulmonary nodules, stable in size and number compared to prior examinations. The largest of these is in the left lower lobe associated with major fissure measuring 9 x 3 mm on image 73/10, unchanged. No new or enlarging suspicious pulmonary nodules or masses visualized. No acute consolidative airspace opacities. Similar biapical pleuroparenchymal scarring. No pleural effusion. No pneumothorax. Musculoskeletal: No aggressive lytic or blastic lesions of bone. CT ABDOMEN PELVIS FINDINGS Hepatobiliary: No significant change in size or number of the bilobar hepatic  metastases. For reference the previously index largest lesion in the right lobe of the liver measures 2.4 x 1.9 cm on image 146/3 to 3 1 to no new suspicious appearing hepatic lesions. Gallbladder is unremarkable. No biliary ductal dilation. Previously 2.4 x 2.0 cm. Pancreas: No pancreatic ductal dilatation or surrounding inflammatory changes. Spleen: Within normal limits. Adrenals/Urinary Tract: Adrenal glands are unremarkable. Kidneys are normal, without renal calculi, solid lesion, or hydronephrosis. Bladder is unremarkable for degree of distension. Stomach/Bowel: Stomach is grossly unremarkable. 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 Slightly increased fat containing peristomal hernia. The appendix is not definitely visualized and may be surgically absent however there is no pericecal inflammation. No pathologic dilation of small  bowel. Vascular/Lymphatic: Aortic atherosclerosis without aneurysmal dilation. No pathologically enlarged abdominal or pelvic lymph nodes. Reproductive: Status post hysterectomy. No adnexal masses. Other: Similar stranding in the presacral space/mesorectal fat, without discrete nodularity. No abdominopelvic ascites. No peritoneal or omental nodularity visualized. Musculoskeletal: Multilevel degenerative changes spine. No aggressive lytic or blastic lesion of bone. IMPRESSION: 1. Stable examination with no significant change in the bilobar hepatic metastases. No new or enlarging hepatic lesions visualized. 2. Multiple small bilateral pulmonary nodules, which appears stable compared to prior examinations and are favored benign. Continued attention on follow-up studies is recommended. 3. Stable postsurgical changes of sigmoid colectomy with Hartmann's pouch and left lower quadrant colostomy, with similar stranding without discrete nodularity in the presacral  space/mesorectal fat, and no findings to suggest local recurrence. 4. Aortic atherosclerosis. Aortic Atherosclerosis (ICD10-I70.0). Electronically Signed   By: Dahlia Bailiff MD   On: 11/19/2020 11:44    ASSESSMENT & PLAN:  Metastatic colon cancer to liver [facility]) She tolerated last cycle of treatment better since discontinuation of oxaliplatin The neuropathy is stable/improved Her main complaint is sensitivity in her mouth and gums, and related to side effects of treatment She has appointment to see dentist and the prescribed amoxicillin is helping We will proceed with treatment as scheduled  Thrombocytopenia ([facility]) This is improved since discontinuation of oxaliplatin Monitor close  Peripheral neuropathy due to chemotherapy [facility]) Her neuropathy is stable/improved since discontinuation of oxaliplatin Monitor closely  Oral pain I did not see any signs of mucositis Her symptoms has improved with amoxicillin She has dental appointment this week I do not recommend narcotic prescription for this minor pain I recommend her to take maximum dose allow of over-the-counter acetaminophen or NSAID as needed  Drug-induced hypokalemia Likely caused by her diuretic therapy Recommend short course of oral potassium supplement   No orders of the defined types were placed in this encounter.   All questions were answered. The patient knows to call the clinic with any problems, questions or concerns. The total time spent in the appointment was 20 minutes encounter with patients including review of chart and various tests results, discussions about plan of care and coordination of care plan   Heath Lark, MD 12/04/2020 9:14 AM

## 2020-12-04 NOTE — Assessment & Plan Note (Signed)
Her neuropathy is stable/improved since discontinuation of oxaliplatin Monitor closely

## 2020-12-04 NOTE — Assessment & Plan Note (Signed)
I did not see any signs of mucositis Her symptoms has improved with amoxicillin She has dental appointment this week I do not recommend narcotic prescription for this minor pain I recommend her to take maximum dose allow of over-the-counter acetaminophen or NSAID as needed

## 2020-12-04 NOTE — Progress Notes (Signed)
Patients port flushed without difficulty.  Good blood return noted with no bruising or swelling noted at site.  Stable during access and blood draw.  Patient to remain accessed for treatment. 

## 2020-12-06 ENCOUNTER — Encounter (HOSPITAL_COMMUNITY): Payer: Self-pay

## 2020-12-06 ENCOUNTER — Other Ambulatory Visit: Payer: Self-pay

## 2020-12-06 ENCOUNTER — Inpatient Hospital Stay (HOSPITAL_COMMUNITY): Payer: Medicaid Other

## 2020-12-06 VITALS — BP 151/73 | HR 67 | Temp 97.7°F | Resp 18

## 2020-12-06 DIAGNOSIS — Z5111 Encounter for antineoplastic chemotherapy: Secondary | ICD-10-CM | POA: Diagnosis not present

## 2020-12-06 DIAGNOSIS — C189 Malignant neoplasm of colon, unspecified: Secondary | ICD-10-CM

## 2020-12-06 MED ORDER — HEPARIN SOD (PORK) LOCK FLUSH 100 UNIT/ML IV SOLN
500.0000 [IU] | Freq: Once | INTRAVENOUS | Status: AC | PRN
  Administered 2020-12-06: 500 [IU]

## 2020-12-06 MED ORDER — SODIUM CHLORIDE 0.9% FLUSH
10.0000 mL | INTRAVENOUS | Status: DC | PRN
  Administered 2020-12-06: 10 mL

## 2020-12-06 NOTE — Progress Notes (Signed)
Pump deaccesed. Port flushed with good blood return noted. No bruising or swelling at site. Bandaid applied and patient discharged in satisfactory condition. VVS stable with no signs or symptoms of distressed noted.

## 2020-12-06 NOTE — Patient Instructions (Signed)
[facility]  Discharge Instructions: Thank you for choosing [facility] to provide your oncology and hematology care.  If you have a lab appointment with the [facility], please come in thru the Main Entrance and check in at the main information desk.  Wear comfortable clothing and clothing appropriate for easy access to any Portacath or PICC line.   We strive to give you quality time with your provider. You may need to reschedule your appointment if you arrive late (15 or more minutes).  Arriving late affects you and other patients whose appointments are after yours.  Also, if you miss three or more appointments without notifying the office, you may be dismissed from the clinic at the provider's discretion.      For prescription refill requests, have your pharmacy contact our office and allow 72 hours for refills to be completed.    Today your pump was deaccessed.   To help prevent nausea and vomiting after your treatment, we encourage you to take your nausea medication as directed.  BELOW ARE SYMPTOMS THAT SHOULD BE REPORTED IMMEDIATELY: *FEVER GREATER THAN 100.4 F (38 C) OR HIGHER *CHILLS OR SWEATING *NAUSEA AND VOMITING THAT IS NOT CONTROLLED WITH YOUR NAUSEA MEDICATION *UNUSUAL SHORTNESS OF BREATH *UNUSUAL BRUISING OR BLEEDING *URINARY PROBLEMS (pain or burning when urinating, or frequent urination) *BOWEL PROBLEMS (unusual diarrhea, constipation, pain near the anus) TENDERNESS IN MOUTH AND THROAT WITH OR WITHOUT PRESENCE OF ULCERS (sore throat, sores in mouth, or a toothache) UNUSUAL RASH, SWELLING OR PAIN  UNUSUAL VAGINAL DISCHARGE OR ITCHING   Items with * indicate a potential emergency and should be followed up as soon as possible or go to the Emergency Department if any problems should occur.  Please show the CHEMOTHERAPY ALERT CARD or IMMUNOTHERAPY ALERT CARD at check-in to the Emergency Department and triage nurse.  Should you have questions  after your visit or need to cancel or reschedule your appointment, please contact [facility] [phone]  and follow the prompts.  Office hours are 8:00 a.m. to 4:30 p.m. Monday - Friday. Please note that voicemails left after 4:00 p.m. may not be returned until the following business day.  We are closed weekends and major holidays. You have access to a nurse at all times for urgent questions. Please call the main number to the clinic [phone] and follow the prompts.  For any non-urgent questions, you may also contact your provider using MyChart. We now offer e-Visits for anyone 67 and older to request care online for non-urgent symptoms. For details visit [email or web address].   Also download the MyChart app! Go to the app store, search "MyChart", open the app, select [facility], and log in with your MyChart username and password.  Due to Covid, a mask is required upon entering the hospital/clinic. If you do not have a mask, one will be given to you upon arrival. For doctor visits, patients may have 1 support person aged 90 or older with them. For treatment visits, patients cannot have anyone with them due to current Covid guidelines and our immunocompromised population.

## 2020-12-18 ENCOUNTER — Inpatient Hospital Stay (HOSPITAL_COMMUNITY): Payer: Medicaid Other

## 2020-12-18 ENCOUNTER — Other Ambulatory Visit: Payer: Self-pay

## 2020-12-18 ENCOUNTER — Encounter (HOSPITAL_COMMUNITY): Payer: Self-pay | Admitting: Hematology and Oncology

## 2020-12-18 ENCOUNTER — Other Ambulatory Visit (HOSPITAL_COMMUNITY): Payer: Self-pay | Admitting: Hematology and Oncology

## 2020-12-18 ENCOUNTER — Inpatient Hospital Stay (HOSPITAL_BASED_OUTPATIENT_CLINIC_OR_DEPARTMENT_OTHER): Payer: Medicaid Other | Admitting: Hematology and Oncology

## 2020-12-18 VITALS — BP 157/91 | HR 78 | Temp 97.1°F | Resp 18 | Ht 63.0 in

## 2020-12-18 VITALS — BP 192/78 | HR 68 | Temp 97.0°F | Resp 18 | Wt 164.2 lb

## 2020-12-18 DIAGNOSIS — I10 Essential (primary) hypertension: Secondary | ICD-10-CM

## 2020-12-18 DIAGNOSIS — C189 Malignant neoplasm of colon, unspecified: Secondary | ICD-10-CM

## 2020-12-18 DIAGNOSIS — C787 Secondary malignant neoplasm of liver and intrahepatic bile duct: Secondary | ICD-10-CM

## 2020-12-18 DIAGNOSIS — F419 Anxiety disorder, unspecified: Secondary | ICD-10-CM | POA: Diagnosis not present

## 2020-12-18 DIAGNOSIS — D696 Thrombocytopenia, unspecified: Secondary | ICD-10-CM

## 2020-12-18 DIAGNOSIS — T50905A Adverse effect of unspecified drugs, medicaments and biological substances, initial encounter: Secondary | ICD-10-CM

## 2020-12-18 DIAGNOSIS — Z5111 Encounter for antineoplastic chemotherapy: Secondary | ICD-10-CM | POA: Diagnosis not present

## 2020-12-18 DIAGNOSIS — E876 Hypokalemia: Secondary | ICD-10-CM

## 2020-12-18 DIAGNOSIS — G620 Drug-induced polyneuropathy: Secondary | ICD-10-CM

## 2020-12-18 DIAGNOSIS — T451X5A Adverse effect of antineoplastic and immunosuppressive drugs, initial encounter: Secondary | ICD-10-CM

## 2020-12-18 LAB — COMPREHENSIVE METABOLIC PANEL
ALT: 27 U/L (ref 0–44)
AST: 34 U/L (ref 15–41)
Albumin: 3.6 g/dL (ref 3.5–5.0)
Alkaline Phosphatase: 130 U/L — ABNORMAL HIGH (ref 38–126)
Anion gap: 10 (ref 5–15)
BUN: 16 mg/dL (ref 6–20)
CO2: 25 mmol/L (ref 22–32)
Calcium: 8.8 mg/dL — ABNORMAL LOW (ref 8.9–10.3)
Chloride: 99 mmol/L (ref 98–111)
Creatinine, Ser: 0.77 mg/dL (ref 0.44–1.00)
GFR, Estimated: >60 mL/min (ref >60)
Glucose, Bld: 112 mg/dL — ABNORMAL HIGH (ref 70–99)
Potassium: 3.4 mmol/L — ABNORMAL LOW (ref 3.5–5.1)
Sodium: 134 mmol/L — ABNORMAL LOW (ref 135–145)
Total Bilirubin: 0.6 mg/dL (ref 0.3–1.2)
Total Protein: 7.7 g/dL (ref 6.5–8.1)

## 2020-12-18 LAB — CBC WITH DIFFERENTIAL/PLATELET
Abs Immature Granulocytes: 0 10*3/uL (ref 0.00–0.07)
Basophils Absolute: 0 10*3/uL (ref 0.0–0.1)
Basophils Relative: 1 %
Eosinophils Absolute: 0.1 10*3/uL (ref 0.0–0.5)
Eosinophils Relative: 3 %
HCT: 37.3 % (ref 36.0–46.0)
Hemoglobin: 12.5 g/dL (ref 12.0–15.0)
Immature Granulocytes: 0 %
Lymphocytes Relative: 45 %
Lymphs Abs: 2 10*3/uL (ref 0.7–4.0)
MCH: 33.3 pg (ref 26.0–34.0)
MCHC: 33.5 g/dL (ref 30.0–36.0)
MCV: 99.5 fL (ref 80.0–100.0)
Monocytes Absolute: 0.5 10*3/uL (ref 0.1–1.0)
Monocytes Relative: 10 %
Neutro Abs: 1.9 10*3/uL (ref 1.7–7.7)
Neutrophils Relative %: 41 %
Platelets: 92 10*3/uL — ABNORMAL LOW (ref 150–400)
RBC: 3.75 MIL/uL — ABNORMAL LOW (ref 3.87–5.11)
RDW: 14.9 % (ref 11.5–15.5)
WBC: 4.5 10*3/uL (ref 4.0–10.5)
nRBC: 0 % (ref 0.0–0.2)

## 2020-12-18 LAB — MAGNESIUM: Magnesium: 1.9 mg/dL (ref 1.7–2.4)

## 2020-12-18 MED ORDER — SODIUM CHLORIDE 0.9 % IV SOLN
1920.0000 mg/m2 | INTRAVENOUS | Status: DC
  Administered 2020-12-18: 3250 mg via INTRAVENOUS
  Filled 2020-12-18: qty 65

## 2020-12-18 MED ORDER — SODIUM CHLORIDE 0.9 % IV SOLN
Freq: Once | INTRAVENOUS | Status: AC
Start: 2020-12-18 — End: 2020-12-18

## 2020-12-18 MED ORDER — HYDROXYZINE PAMOATE 25 MG PO CAPS: 25.0000 mg | ORAL_CAPSULE | Freq: Three times a day (TID) | ORAL | 2 refills | Status: DC | PRN

## 2020-12-18 MED ORDER — SODIUM CHLORIDE 0.9 % IV SOLN
10.0000 mg | Freq: Once | INTRAVENOUS | Status: AC
  Administered 2020-12-18: 10 mg via INTRAVENOUS
  Filled 2020-12-18: qty 10

## 2020-12-18 MED ORDER — PALONOSETRON HCL INJECTION 0.25 MG/5ML
INTRAVENOUS | Status: AC
  Filled 2020-12-18: qty 5

## 2020-12-18 MED ORDER — FLUOROURACIL CHEMO INJECTION 2.5 GM/50ML
320.0000 mg/m2 | Freq: Once | INTRAVENOUS | Status: AC
  Administered 2020-12-18: 550 mg via INTRAVENOUS
  Filled 2020-12-18: qty 11

## 2020-12-18 MED ORDER — SODIUM CHLORIDE 0.9 % IV SOLN
400.0000 mg/m2 | Freq: Once | INTRAVENOUS | Status: AC
  Administered 2020-12-18: 680 mg via INTRAVENOUS
  Filled 2020-12-18: qty 34

## 2020-12-18 MED ORDER — PALONOSETRON HCL INJECTION 0.25 MG/5ML
0.2500 mg | Freq: Once | INTRAVENOUS | Status: AC
  Administered 2020-12-18: 0.25 mg via INTRAVENOUS

## 2020-12-18 NOTE — Assessment & Plan Note (Signed)
She has uncontrolled hypertension I discussed with the patient's the importance of aggressive blood pressure management I will continue to hold bevacizumab today

## 2020-12-18 NOTE — Progress Notes (Signed)
Patient presents today for treatment and follow up visit with Dr. Alvy Bimler. Blood pressure elevated on arrival. Patient denies headaches, blurred vision. Platelets 92. MD aware and labs reviewed.   Message received from Dr. Alvy Bimler to proceed with treatment today. HOLD Avastin.   Treatment given today per MD orders. Tolerated infusion without adverse affects. Vital signs stable. No complaints at this time. RUN noted on 5FU pump screen and verfied with the patient. Discharged from clinic ambulatory in stable condition. Alert and oriented x 3. F/U with [facility] as scheduled.

## 2020-12-18 NOTE — Progress Notes (Signed)
[facility] OFFICE PROGRESS NOTE  Patient Care Team: Patient, No Pcp Per (Inactive) as PCP - General (General Practice) Brien Mates, RN as Oncology Nurse Navigator (Oncology)  ASSESSMENT & PLAN:  Metastatic colon cancer to liver [facility]) She tolerated last cycle of treatment better since discontinuation of oxaliplatin The neuropathy is stable/improved Her main complaint is sensitivity in her mouth and gums, and related to side effects of treatment; that is also improving We will proceed with chemo as scheduled  Essential hypertension She has uncontrolled hypertension I discussed with the patient's the importance of aggressive blood pressure management I will continue to hold bevacizumab today  Thrombocytopenia ([place]) She has intermittent thrombocytopenia, likely due to side effects of treatment We will proceed with treatment She has no signs or symptoms of bleeding Uncontrolled hypertension can also cause thrombocytopenia  Drug-induced hypokalemia Likely caused by her diuretic therapy Recommend short course of oral potassium supplement  Peripheral neuropathy due to chemotherapy ([place]) Her neuropathy is stable/improved since discontinuation of oxaliplatin Monitor closely  Orders Placed This Encounter  Procedures   Magnesium    Standing Status:   Standing    Number of Occurrences:   10    Standing Expiration Date:   12/18/2021    Order Specific Question:   Release to patient    Answer:   Immediate    All questions were answered. The patient knows to call the clinic with any problems, questions or concerns. The total time spent in the appointment was 30 minutes encounter with patients including review of chart and various tests results, discussions about plan of care and coordination of care plan   Heath Lark, MD 12/18/2020 12:42 PM  INTERVAL HISTORY: Please see below for problem oriented charting. She returns for treatment and follow-up She has not been  monitoring her blood pressure regularly at home Her dental visit is next week Her dental pain is still present but slightly better She continues to have mild peripheral neuropathy but not worse No recent mucositis, nausea or diarrhea The patient denies any recent signs or symptoms of bleeding such as spontaneous epistaxis, hematuria or hematochezia.   SUMMARY OF ONCOLOGIC HISTORY: Oncology History  Metastatic colon cancer to liver ([place])  05/16/2020 Initial Diagnosis   Metastatic colon cancer to liver ([place])    06/03/2020 Genetic Testing   [facility] One:     06/04/2020 -  Chemotherapy    Patient is on Treatment Plan: COLORECTAL FOLFOX + BEVACIZUMAB Q14D         REVIEW OF SYSTEMS:   Constitutional: Denies fevers, chills or abnormal weight loss Eyes: Denies blurriness of vision Ears, nose, mouth, throat, and face: Denies mucositis or sore throat Respiratory: Denies cough, dyspnea or wheezes Cardiovascular: Denies palpitation, chest discomfort or lower extremity swelling Gastrointestinal:  Denies nausea, heartburn or change in bowel habits Skin: Denies abnormal skin rashes Lymphatics: Denies new lymphadenopathy or easy bruising Neurological:Denies numbness, tingling or new weaknesses Behavioral/Psych: Mood is stable, no new changes  All other systems were reviewed with the patient and are negative.  I have reviewed the past medical history, past surgical history, social history and family history with the patient and they are unchanged from previous note.  ALLERGIES:  has No Known Allergies.  MEDICATIONS:  Current Outpatient Medications  Medication Sig Dispense Refill   amoxicillin (AMOXIL) 500 MG capsule Take 500 mg by mouth 3 (three) times daily.     BEVACIZUMAB IV Inject 5 mg/kg into the vein every 14 (fourteen) days.  fluorouracil CALGB 81191 in sodium chloride 0.9 % 150 mL Inject 2,400 mg/m2 into the vein over 48 hr.     LEUCOVORIN CALCIUM IV Inject 400 mg/m2 into  the vein every 21 ( twenty-one) days.     lidocaine-prilocaine (EMLA) cream Apply small amount to port a cath site and cover with plastic wrap 1 hour prior to chemotherapy appointments 30 g 3   nystatin (MYCOSTATIN) 100000 UNIT/ML suspension Take by mouth.     OXALIPLATIN IV Inject 85 mg/m2 into the vein every 14 (fourteen) days.     PARoxetine (PAXIL) 40 MG tablet Take 1 tablet (40 mg total) by mouth every morning. 90 tablet 3   polyethylene glycol (MIRALAX / GLYCOLAX) packet Take 17 g by mouth every other day.     potassium chloride SA (KLOR-CON) 20 MEQ tablet Take 1 tablet (20 mEq total) by mouth daily. 30 tablet 0   prochlorperazine (COMPAZINE) 10 MG tablet Take 1 tablet (10 mg total) by mouth every 6 (six) hours as needed (Nausea or vomiting). 30 tablet 1   triamterene-hydrochlorothiazide (MAXZIDE-25) 37.5-25 MG tablet TAKE 1 TABLET ONCE DAILY. 30 tablet 0   hydrOXYzine (VISTARIL) 25 MG capsule Take 1 capsule (25 mg total) by mouth 3 (three) times daily as needed. Prn anxiety 60 capsule 2   No current facility-administered medications for this visit.   Facility-Administered Medications Ordered in Other Visits  Medication Dose Route Frequency Provider Last Rate Last Admin   fluorouracil (ADRUCIL) 3,250 mg in sodium chloride 0.9 % 85 mL chemo infusion  1,920 mg/m2 (Order-Specific) Intravenous 1 day or 1 dose Alvy Bimler, Garhett Bernhard, MD   3,250 mg at 12/18/20 1155    PHYSICAL EXAMINATION: ECOG PERFORMANCE STATUS: 1 - Symptomatic but completely ambulatory  Vitals:   12/18/20 0823 12/18/20 0834  BP:  (!) 192/78  Pulse: 68   Resp: 18   Temp: (!) 97 F (36.1 C)   SpO2: 96%    Filed Weights   12/18/20 0823  Weight: 164 lb 3.2 oz (74.5 kg)    GENERAL:alert, no distress and comfortable SKIN: skin color, texture, turgor are normal, no rashes or significant lesions EYES: normal, Conjunctiva are pink and non-injected, sclera clear OROPHARYNX:no exudate, no erythema and lips, buccal mucosa, and  tongue normal  NECK: supple, thyroid normal size, non-tender, without nodularity LYMPH:  no palpable lymphadenopathy in the cervical, axillary or inguinal LUNGS: clear to auscultation and percussion with normal breathing effort HEART: regular rate & rhythm and no murmurs and no lower extremity edema ABDOMEN:abdomen soft, non-tender and normal bowel sounds Musculoskeletal:no cyanosis of digits and no clubbing  NEURO: alert & oriented x 3 with fluent speech, no focal motor/sensory deficits  LABORATORY DATA:  I have reviewed the data as listed    Component Value Date/Time   NA 134 (L) 12/18/2020 0831   NA 140 03/05/2016 1201   K 3.4 (L) 12/18/2020 0831   CL 99 12/18/2020 0831   CO2 25 12/18/2020 0831   GLUCOSE 112 (H) 12/18/2020 0831   BUN 16 12/18/2020 0831   BUN 10 03/05/2016 1201   CREATININE 0.77 12/18/2020 0831   CALCIUM 8.8 (L) 12/18/2020 0831   PROT 7.7 12/18/2020 0831   PROT 7.4 03/05/2016 1201   ALBUMIN 3.6 12/18/2020 0831   ALBUMIN 4.0 03/05/2016 1201   AST 34 12/18/2020 0831   ALT 27 12/18/2020 0831   ALKPHOS 130 (H) 12/18/2020 0831   BILITOT 0.6 12/18/2020 0831   BILITOT 0.3 03/05/2016 1201   GFRNONAA >60 12/18/2020  0831   GFRAA >60 04/01/2016 0541    No results found for: SPEP, UPEP  Lab Results  Component Value Date   WBC 4.5 12/18/2020   NEUTROABS 1.9 12/18/2020   HGB 12.5 12/18/2020   HCT 37.3 12/18/2020   MCV 99.5 12/18/2020   PLT 92 (L) 12/18/2020      Chemistry      Component Value Date/Time   NA 134 (L) 12/18/2020 0831   NA 140 03/05/2016 1201   K 3.4 (L) 12/18/2020 0831   CL 99 12/18/2020 0831   CO2 25 12/18/2020 0831   BUN 16 12/18/2020 0831   BUN 10 03/05/2016 1201   CREATININE 0.77 12/18/2020 0831      Component Value Date/Time   CALCIUM 8.8 (L) 12/18/2020 0831   ALKPHOS 130 (H) 12/18/2020 0831   AST 34 12/18/2020 0831   ALT 27 12/18/2020 0831   BILITOT 0.6 12/18/2020 0831   BILITOT 0.3 03/05/2016 1201       RADIOGRAPHIC  STUDIES: I have personally reviewed the radiological images as listed and agreed with the findings in the report. CT CHEST ABDOMEN PELVIS W CONTRAST  Result Date: 11/19/2020 CLINICAL DATA:  History of colorectal cancer with metastatic disease to the liver, follow-up EXAM: CT CHEST, ABDOMEN, AND PELVIS WITH CONTRAST TECHNIQUE: Multidetector CT imaging of the chest, abdomen and pelvis was performed following the standard protocol during bolus administration of intravenous contrast. CONTRAST:  158mL OMNIPAQUE IOHEXOL 300 MG/ML  SOLN COMPARISON:  Multiple priors including most recent CT chest abdomen and pelvis August 27, 2020. FINDINGS: CT CHEST FINDINGS Cardiovascular: Right chest Port-A-Cath with tip at the superior cavoatrial junction. No thoracic aortic aneurysm. No central pulmonary embolus. Calcifications of the aortic valve. Normal size heart. No significant pericardial effusion/thickening. Mediastinum/Nodes: No pathologically enlarged mediastinal, hilar or axillary lymph nodes. Trachea esophagus are grossly unremarkable. Hypodense 5 mm left thyroid nodule. Not clinically significant; no follow-up imaging recommended (ref: J Am Coll Radiol. 2015 Feb;12(2): 143-50). Lungs/Pleura: Again seen are multiple tiny bilateral pulmonary nodules, stable in size and number compared to prior examinations. The largest of these is in the left lower lobe associated with major fissure measuring 9 x 3 mm on image 73/10, unchanged. No new or enlarging suspicious pulmonary nodules or masses visualized. No acute consolidative airspace opacities. Similar biapical pleuroparenchymal scarring. No pleural effusion. No pneumothorax. Musculoskeletal: No aggressive lytic or blastic lesions of bone. CT ABDOMEN PELVIS FINDINGS Hepatobiliary: No significant change in size or number of the bilobar hepatic metastases. For reference the previously index largest lesion in the right lobe of the liver measures 2.4 x 1.9 cm on image 146/3 to 3 1 to  no new suspicious appearing hepatic lesions. Gallbladder is unremarkable. No biliary ductal dilation. Previously 2.4 x 2.0 cm. Pancreas: No pancreatic ductal dilatation or surrounding inflammatory changes. Spleen: Within normal limits. Adrenals/Urinary Tract: Adrenal glands are unremarkable. Kidneys are normal, without renal calculi, solid lesion, or hydronephrosis. Bladder is unremarkable for degree of distension. Stomach/Bowel: Stomach is grossly unremarkable. 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 Slightly increased fat containing peristomal hernia. The appendix is not definitely visualized and may be surgically absent however there is no pericecal inflammation. No pathologic dilation of small bowel. Vascular/Lymphatic: Aortic atherosclerosis without aneurysmal dilation. No pathologically enlarged abdominal or pelvic lymph nodes. Reproductive: Status post hysterectomy. No adnexal masses. Other: Similar stranding in the presacral space/mesorectal fat, without discrete nodularity.  No abdominopelvic ascites. No peritoneal or omental nodularity visualized. Musculoskeletal: Multilevel degenerative changes spine. No aggressive lytic or blastic lesion of bone. IMPRESSION: 1. Stable examination with no significant change in the bilobar hepatic metastases. No new or enlarging hepatic lesions visualized. 2. Multiple small bilateral pulmonary nodules, which appears stable compared to prior examinations and are favored benign. Continued attention on follow-up studies is recommended. 3. 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 4. Aortic atherosclerosis. Aortic Atherosclerosis (ICD10-I70.0). Electronically  Signed   By: Dahlia Bailiff MD   On: 11/19/2020 11:44

## 2020-12-18 NOTE — Patient Instructions (Signed)
[facility]  Discharge Instructions: Thank you for choosing [facility] to provide your oncology and hematology care.  If you have a lab appointment with the [facility], please come in thru the Main Entrance and check in at the main information desk.  Wear comfortable clothing and clothing appropriate for easy access to any Portacath or PICC line.   We strive to give you quality time with your provider. You may need to reschedule your appointment if you arrive late (15 or more minutes).  Arriving late affects you and other patients whose appointments are after yours.  Also, if you miss three or more appointments without notifying the office, you may be dismissed from the clinic at the provider's discretion.      For prescription refill requests, have your pharmacy contact our office and allow 72 hours for refills to be completed.    Today you received the following chemotherapy and/or immunotherapy agents Leucovorin/5FU pump.       To help prevent nausea and vomiting after your treatment, we encourage you to take your nausea medication as directed.  BELOW ARE SYMPTOMS THAT SHOULD BE REPORTED IMMEDIATELY: *FEVER GREATER THAN 100.4 F (38 C) OR HIGHER *CHILLS OR SWEATING *NAUSEA AND VOMITING THAT IS NOT CONTROLLED WITH YOUR NAUSEA MEDICATION *UNUSUAL SHORTNESS OF BREATH *UNUSUAL BRUISING OR BLEEDING *URINARY PROBLEMS (pain or burning when urinating, or frequent urination) *BOWEL PROBLEMS (unusual diarrhea, constipation, pain near the anus) TENDERNESS IN MOUTH AND THROAT WITH OR WITHOUT PRESENCE OF ULCERS (sore throat, sores in mouth, or a toothache) UNUSUAL RASH, SWELLING OR PAIN  UNUSUAL VAGINAL DISCHARGE OR ITCHING   Items with * indicate a potential emergency and should be followed up as soon as possible or go to the Emergency Department if any problems should occur.  Please show the CHEMOTHERAPY ALERT CARD or IMMUNOTHERAPY ALERT CARD at check-in to the  Emergency Department and triage nurse.  Should you have questions after your visit or need to cancel or reschedule your appointment, please contact [facility] [phone]  and follow the prompts.  Office hours are 8:00 a.m. to 4:30 p.m. Monday - Friday. Please note that voicemails left after 4:00 p.m. may not be returned until the following business day.  We are closed weekends and major holidays. You have access to a nurse at all times for urgent questions. Please call the main number to the clinic [phone] and follow the prompts.  For any non-urgent questions, you may also contact your provider using MyChart. We now offer e-Visits for anyone 62 and older to request care online for non-urgent symptoms. For details visit [email or web address].   Also download the MyChart app! Go to the app store, search "MyChart", open the app, select [facility], and log in with your MyChart username and password.  Due to Covid, a mask is required upon entering the hospital/clinic. If you do not have a mask, one will be given to you upon arrival. For doctor visits, patients may have 1 support person aged 67 or older with them. For treatment visits, patients cannot have anyone with them due to current Covid guidelines and our immunocompromised population.

## 2020-12-18 NOTE — Assessment & Plan Note (Signed)
Likely caused by her diuretic therapy Recommend short course of oral potassium supplement

## 2020-12-18 NOTE — Assessment & Plan Note (Signed)
She tolerated last cycle of treatment better since discontinuation of oxaliplatin The neuropathy is stable/improved Her main complaint is sensitivity in her mouth and gums, and related to side effects of treatment; that is also improving We will proceed with chemo as scheduled

## 2020-12-18 NOTE — Assessment & Plan Note (Signed)
Her neuropathy is stable/improved since discontinuation of oxaliplatin Monitor closely

## 2020-12-18 NOTE — Assessment & Plan Note (Signed)
She has intermittent thrombocytopenia, likely due to side effects of treatment We will proceed with treatment She has no signs or symptoms of bleeding Uncontrolled hypertension can also cause thrombocytopenia

## 2020-12-20 ENCOUNTER — Inpatient Hospital Stay (HOSPITAL_COMMUNITY): Payer: Medicaid Other

## 2020-12-20 VITALS — BP 141/77 | HR 72 | Temp 97.0°F | Resp 18

## 2020-12-20 DIAGNOSIS — C189 Malignant neoplasm of colon, unspecified: Secondary | ICD-10-CM

## 2020-12-20 DIAGNOSIS — C787 Secondary malignant neoplasm of liver and intrahepatic bile duct: Secondary | ICD-10-CM

## 2020-12-20 DIAGNOSIS — Z5111 Encounter for antineoplastic chemotherapy: Secondary | ICD-10-CM | POA: Diagnosis not present

## 2020-12-20 MED ORDER — HEPARIN SOD (PORK) LOCK FLUSH 100 UNIT/ML IV SOLN
500.0000 [IU] | Freq: Once | INTRAVENOUS | Status: AC | PRN
  Administered 2020-12-20: 500 [IU]

## 2020-12-20 MED ORDER — SODIUM CHLORIDE 0.9% FLUSH
10.0000 mL | INTRAVENOUS | Status: DC | PRN
  Administered 2020-12-20: 10 mL

## 2020-12-20 NOTE — Patient Instructions (Signed)
[facility]  Discharge Instructions: Thank you for choosing [facility] to provide your oncology and hematology care.  If you have a lab appointment with the [facility], please come in thru the Main Entrance and check in at the main information desk.  Wear comfortable clothing and clothing appropriate for easy access to any Portacath or PICC line.   We strive to give you quality time with your provider. You may need to reschedule your appointment if you arrive late (15 or more minutes).  Arriving late affects you and other patients whose appointments are after yours.  Also, if you miss three or more appointments without notifying the office, you may be dismissed from the clinic at the provider's discretion.      For prescription refill requests, have your pharmacy contact our office and allow 72 hours for refills to be completed.    Today you received the following chemotherapy and/or immunotherapy agents 5FU pump stop with port flush and deaccess.      To help prevent nausea and vomiting after your treatment, we encourage you to take your nausea medication as directed.  BELOW ARE SYMPTOMS THAT SHOULD BE REPORTED IMMEDIATELY: *FEVER GREATER THAN 100.4 F (38 C) OR HIGHER *CHILLS OR SWEATING *NAUSEA AND VOMITING THAT IS NOT CONTROLLED WITH YOUR NAUSEA MEDICATION *UNUSUAL SHORTNESS OF BREATH *UNUSUAL BRUISING OR BLEEDING *URINARY PROBLEMS (pain or burning when urinating, or frequent urination) *BOWEL PROBLEMS (unusual diarrhea, constipation, pain near the anus) TENDERNESS IN MOUTH AND THROAT WITH OR WITHOUT PRESENCE OF ULCERS (sore throat, sores in mouth, or a toothache) UNUSUAL RASH, SWELLING OR PAIN  UNUSUAL VAGINAL DISCHARGE OR ITCHING   Items with * indicate a potential emergency and should be followed up as soon as possible or go to the Emergency Department if any problems should occur.  Please show the CHEMOTHERAPY ALERT CARD or IMMUNOTHERAPY ALERT CARD  at check-in to the Emergency Department and triage nurse.  Should you have questions after your visit or need to cancel or reschedule your appointment, please contact [facility] [phone]  and follow the prompts.  Office hours are 8:00 a.m. to 4:30 p.m. Monday - Friday. Please note that voicemails left after 4:00 p.m. may not be returned until the following business day.  We are closed weekends and major holidays. You have access to a nurse at all times for urgent questions. Please call the main number to the clinic [phone] and follow the prompts.  For any non-urgent questions, you may also contact your provider using MyChart. We now offer e-Visits for anyone 45 and older to request care online for non-urgent symptoms. For details visit [email or web address].   Also download the MyChart app! Go to the app store, search "MyChart", open the app, select [facility], and log in with your MyChart username and password.  Due to Covid, a mask is required upon entering the hospital/clinic. If you do not have a mask, one will be given to you upon arrival. For doctor visits, patients may have 1 support person aged 72 or older with them. For treatment visits, patients cannot have anyone with them due to current Covid guidelines and our immunocompromised population.

## 2020-12-20 NOTE — Progress Notes (Signed)
5FU Pump stopped, 150 total volume infused.  Patients port flushed without difficulty.  Good blood return noted with no bruising or swelling noted at site.  Band aid applied.  VSS with discharge and left in satisfactory condition with no s/s of distress noted.

## 2021-01-01 ENCOUNTER — Inpatient Hospital Stay (HOSPITAL_COMMUNITY): Payer: Medicaid Other | Attending: Hematology

## 2021-01-01 ENCOUNTER — Inpatient Hospital Stay (HOSPITAL_BASED_OUTPATIENT_CLINIC_OR_DEPARTMENT_OTHER): Payer: Medicaid Other | Admitting: Hematology and Oncology

## 2021-01-01 ENCOUNTER — Encounter (HOSPITAL_COMMUNITY): Payer: Self-pay | Admitting: Hematology and Oncology

## 2021-01-01 ENCOUNTER — Other Ambulatory Visit: Payer: Self-pay

## 2021-01-01 ENCOUNTER — Inpatient Hospital Stay (HOSPITAL_COMMUNITY): Payer: Medicaid Other

## 2021-01-01 VITALS — BP 149/71 | HR 71 | Temp 97.9°F | Resp 18 | Wt 160.8 lb

## 2021-01-01 DIAGNOSIS — C189 Malignant neoplasm of colon, unspecified: Secondary | ICD-10-CM | POA: Diagnosis not present

## 2021-01-01 DIAGNOSIS — I10 Essential (primary) hypertension: Secondary | ICD-10-CM | POA: Insufficient documentation

## 2021-01-01 DIAGNOSIS — Z5111 Encounter for antineoplastic chemotherapy: Secondary | ICD-10-CM | POA: Diagnosis not present

## 2021-01-01 DIAGNOSIS — C787 Secondary malignant neoplasm of liver and intrahepatic bile duct: Secondary | ICD-10-CM

## 2021-01-01 DIAGNOSIS — C169 Malignant neoplasm of stomach, unspecified: Secondary | ICD-10-CM | POA: Insufficient documentation

## 2021-01-01 DIAGNOSIS — T451X5A Adverse effect of antineoplastic and immunosuppressive drugs, initial encounter: Secondary | ICD-10-CM

## 2021-01-01 DIAGNOSIS — Z79899 Other long term (current) drug therapy: Secondary | ICD-10-CM | POA: Insufficient documentation

## 2021-01-01 DIAGNOSIS — G620 Drug-induced polyneuropathy: Secondary | ICD-10-CM | POA: Insufficient documentation

## 2021-01-01 LAB — CBC WITH DIFFERENTIAL/PLATELET
Abs Immature Granulocytes: 0.01 10*3/uL (ref 0.00–0.07)
Basophils Absolute: 0 10*3/uL (ref 0.0–0.1)
Basophils Relative: 1 %
Eosinophils Absolute: 0.1 10*3/uL (ref 0.0–0.5)
Eosinophils Relative: 2 %
HCT: 39.9 % (ref 36.0–46.0)
Hemoglobin: 13.6 g/dL (ref 12.0–15.0)
Immature Granulocytes: 0 %
Lymphocytes Relative: 27 %
Lymphs Abs: 1.4 10*3/uL (ref 0.7–4.0)
MCH: 33 pg (ref 26.0–34.0)
MCHC: 34.1 g/dL (ref 30.0–36.0)
MCV: 96.8 fL (ref 80.0–100.0)
Monocytes Absolute: 0.5 10*3/uL (ref 0.1–1.0)
Monocytes Relative: 8 %
Neutro Abs: 3.4 10*3/uL (ref 1.7–7.7)
Neutrophils Relative %: 62 %
Platelets: 154 10*3/uL (ref 150–400)
RBC: 4.12 MIL/uL (ref 3.87–5.11)
RDW: 14.4 % (ref 11.5–15.5)
WBC: 5.4 10*3/uL (ref 4.0–10.5)
nRBC: 0 % (ref 0.0–0.2)

## 2021-01-01 LAB — COMPREHENSIVE METABOLIC PANEL
ALT: 25 U/L (ref 0–44)
AST: 24 U/L (ref 15–41)
Albumin: 3.7 g/dL (ref 3.5–5.0)
Alkaline Phosphatase: 128 U/L — ABNORMAL HIGH (ref 38–126)
Anion gap: 10 (ref 5–15)
BUN: 18 mg/dL (ref 6–20)
CO2: 28 mmol/L (ref 22–32)
Calcium: 9.2 mg/dL (ref 8.9–10.3)
Chloride: 98 mmol/L (ref 98–111)
Creatinine, Ser: 0.66 mg/dL (ref 0.44–1.00)
GFR, Estimated: >60 mL/min (ref >60)
Glucose, Bld: 109 mg/dL — ABNORMAL HIGH (ref 70–99)
Potassium: 3.4 mmol/L — ABNORMAL LOW (ref 3.5–5.1)
Sodium: 136 mmol/L (ref 135–145)
Total Bilirubin: 0.8 mg/dL (ref 0.3–1.2)
Total Protein: 8 g/dL (ref 6.5–8.1)

## 2021-01-01 LAB — URINALYSIS, DIPSTICK ONLY
Bilirubin Urine: NEGATIVE
Glucose, UA: NEGATIVE mg/dL
Hgb urine dipstick: NEGATIVE
Ketones, ur: NEGATIVE mg/dL
Leukocytes,Ua: NEGATIVE
Nitrite: NEGATIVE
Protein, ur: NEGATIVE mg/dL
Specific Gravity, Urine: 1.013 (ref 1.005–1.030)
pH: 7 (ref 5.0–8.0)

## 2021-01-01 LAB — MAGNESIUM: Magnesium: 2 mg/dL (ref 1.7–2.4)

## 2021-01-01 MED ORDER — LEUCOVORIN CALCIUM INJECTION 350 MG
400.0000 mg/m2 | Freq: Once | INTRAMUSCULAR | Status: AC
  Administered 2021-01-01: 680 mg via INTRAVENOUS
  Filled 2021-01-01: qty 34

## 2021-01-01 MED ORDER — SODIUM CHLORIDE 0.9 % IV SOLN
5.0000 mg/kg | Freq: Once | INTRAVENOUS | Status: AC
  Administered 2021-01-01: 350 mg via INTRAVENOUS
  Filled 2021-01-01: qty 14

## 2021-01-01 MED ORDER — SODIUM CHLORIDE 0.9 % IV SOLN
1920.0000 mg/m2 | INTRAVENOUS | Status: DC
  Administered 2021-01-01: 3250 mg via INTRAVENOUS
  Filled 2021-01-01: qty 65

## 2021-01-01 MED ORDER — FLUOROURACIL CHEMO INJECTION 2.5 GM/50ML
320.0000 mg/m2 | Freq: Once | INTRAVENOUS | Status: AC
  Administered 2021-01-01: 550 mg via INTRAVENOUS
  Filled 2021-01-01: qty 11

## 2021-01-01 MED ORDER — PALONOSETRON HCL INJECTION 0.25 MG/5ML
0.2500 mg | Freq: Once | INTRAVENOUS | Status: AC
  Administered 2021-01-01: 0.25 mg via INTRAVENOUS
  Filled 2021-01-01: qty 5

## 2021-01-01 MED ORDER — DEXTROSE 5 % IV SOLN: Freq: Once | INTRAVENOUS | Status: DC

## 2021-01-01 MED ORDER — DEXAMETHASONE SODIUM PHOSPHATE 100 MG/10ML IJ SOLN
10.0000 mg | Freq: Once | INTRAMUSCULAR | Status: AC
Start: 2021-01-01 — End: 2021-01-01
  Administered 2021-01-01: 10 mg via INTRAVENOUS
  Filled 2021-01-01: qty 10

## 2021-01-01 MED ORDER — HEPARIN SOD (PORK) LOCK FLUSH 100 UNIT/ML IV SOLN: 500.0000 [IU] | Freq: Once | INTRAVENOUS | Status: DC | PRN

## 2021-01-01 MED ORDER — SODIUM CHLORIDE 0.9% FLUSH: 10.0000 mL | INTRAVENOUS | Status: DC | PRN

## 2021-01-01 MED ORDER — SODIUM CHLORIDE 0.9 % IV SOLN
Freq: Once | INTRAVENOUS | Status: AC
Start: 2021-01-01 — End: 2021-01-01

## 2021-01-01 NOTE — Patient Instructions (Signed)
Susan Martin  Discharge Instructions: Thank you for choosing [facility] to provide your oncology and hematology care.  If you have a lab appointment with the [facility], please come in thru the Main Entrance and check in at the main information desk.  Wear comfortable clothing and clothing appropriate for easy access to any Portacath or PICC line.   We strive to give you quality time with your provider. You may need to reschedule your appointment if you arrive late (15 or more minutes).  Arriving late affects you and other patients whose appointments are after yours.  Also, if you miss three or more appointments without notifying the office, you may be dismissed from the clinic at the provider's discretion.      For prescription refill requests, have your pharmacy contact our office and allow 72 hours for refills to be completed.    Today you received the following chemotherapy and/or immunotherapy agents avastin, leucovorin, and 82fu      To help prevent nausea and vomiting after your treatment, we encourage you to take your nausea medication as directed.  BELOW ARE SYMPTOMS THAT SHOULD BE REPORTED IMMEDIATELY: *FEVER GREATER THAN 100.4 F (38 C) OR HIGHER *CHILLS OR SWEATING *NAUSEA AND VOMITING THAT IS NOT CONTROLLED WITH YOUR NAUSEA MEDICATION *UNUSUAL SHORTNESS OF BREATH *UNUSUAL BRUISING OR BLEEDING *URINARY PROBLEMS (pain or burning when urinating, or frequent urination) *BOWEL PROBLEMS (unusual diarrhea, constipation, pain near the anus) TENDERNESS IN MOUTH AND THROAT WITH OR WITHOUT PRESENCE OF ULCERS (sore throat, sores in mouth, or a toothache) UNUSUAL RASH, SWELLING OR PAIN  UNUSUAL VAGINAL DISCHARGE OR ITCHING   Items with * indicate a potential emergency and should be followed up as soon as possible or go to the Emergency Department if any problems should occur.  Please show the CHEMOTHERAPY ALERT CARD or IMMUNOTHERAPY ALERT CARD at check-in to  the Emergency Department and triage nurse.  Should you have questions after your visit or need to cancel or reschedule your appointment, please contact [facility] [phone]  and follow the prompts.  Office hours are 8:00 a.m. to 4:30 p.m. Monday - Friday. Please note that voicemails left after 4:00 p.m. may not be returned until the following business day.  We are closed weekends and major holidays. You have access to a nurse at all times for urgent questions. Please call the main number to the clinic [phone] and follow the prompts.  For any non-urgent questions, you may also contact your provider using MyChart. We now offer e-Visits for anyone 24 and older to request care online for non-urgent symptoms. For details visit [email or web address].   Also download the MyChart app! Go to the app store, search "MyChart", open the app, select [facility], and log in with your MyChart username and password.  Due to Covid, a mask is required upon entering the hospital/clinic. If you do not have a mask, one will be given to you upon arrival. For doctor visits, patients may have 1 support person aged 40 or older with them. For treatment visits, patients cannot have anyone with them due to current Covid guidelines and our immunocompromised population.    Bevacizumab injection What is this medication? BEVACIZUMAB (be va SIZ yoo mab) is a monoclonal antibody. It is used to treatmany types of cancer. This medicine may be used for other purposes; ask your health care provider orpharmacist if you have questions. COMMON BRAND NAME(S): Avastin, MVASI, Zirabev What should I tell my care  team before I take this medication? They need to know if you have any of these conditions: diabetes heart disease high blood pressure history of coughing up blood prior anthracycline chemotherapy (e.g., doxorubicin, daunorubicin, epirubicin) recent or ongoing radiation therapy recent or planning to have  surgery stroke an unusual or allergic reaction to bevacizumab, hamster proteins, mouse proteins, other medicines, foods, dyes, or preservatives pregnant or trying to get pregnant breast-feeding How should I use this medication? This medicine is for infusion into a vein. It is given by a health careprofessional in a hospital or clinic setting. Talk to your pediatrician regarding the use of this medicine in children.Special care may be needed. Overdosage: If you think you have taken too much of this medicine contact apoison control center or emergency room at once. NOTE: This medicine is only for you. Do not share this medicine with others. What if I miss a dose? It is important not to miss your dose. Call your doctor or health careprofessional if you are unable to keep an appointment. What may interact with this medication? Interactions are not expected. This list may not describe all possible interactions. Give your health care provider a list of all the medicines, herbs, non-prescription drugs, or dietary supplements you use. Also tell them if you smoke, drink alcohol, or use illegaldrugs. Some items may interact with your medicine. What should I watch for while using this medication? Your condition will be monitored carefully while you are receiving this medicine. You will need important blood work and urine testing done while youare taking this medicine. This medicine may increase your risk to bruise or bleed. Call your doctor orhealth care professional if you notice any unusual bleeding. Before having surgery, talk to your health care provider to make sure it is ok. This drug can increase the risk of poor healing of your surgical site or wound. You will need to stop this drug for 28 days before surgery. After surgery, wait at least 28 days before restarting this drug. Make sure the surgical site or wound is healed enough before restarting this drug. Talk to your health careprovider if  questions. Do not become pregnant while taking this medicine or for 6 months after stopping it. Women should inform their doctor if they wish to become pregnant or think they might be pregnant. There is a potential for serious side effects to an unborn child. Talk to your health care professional or pharmacist for more information. Do not breast-feed an infant while taking this medicine andfor 6 months after the last dose. This medicine has caused ovarian failure in some women. This medicine may interfere with the ability to have a child. You should talk to your doctor orhealth care professional if you are concerned about your fertility. What side effects may I notice from receiving this medication? Side effects that you should report to your doctor or health care professionalas soon as possible: allergic reactions like skin rash, itching or hives, swelling of the face, lips, or tongue chest pain or chest tightness chills coughing up blood high fever seizures severe constipation signs and symptoms of bleeding such as bloody or black, tarry stools; red or dark-brown urine; spitting up blood or brown material that looks like coffee grounds; red spots on the skin; unusual bruising or bleeding from the eye, gums, or nose signs and symptoms of a blood clot such as breathing problems; chest pain; severe, sudden headache; pain, swelling, warmth in the leg signs and symptoms of a stroke like changes  in vision; confusion; trouble speaking or understanding; severe headaches; sudden numbness or weakness of the face, arm or leg; trouble walking; dizziness; loss of balance or coordination stomach pain sweating swelling of legs or ankles vomiting weight gain Side effects that usually do not require medical attention (report to yourdoctor or health care professional if they continue or are bothersome): back pain changes in taste decreased appetite dry skin nausea tiredness This list may not describe all  possible side effects. Call your doctor for medical advice about side effects. You may report side effects to FDA at1-800-FDA-1088. Where should I keep my medication? This drug is given in a hospital or clinic and will not be stored at home. NOTE: This sheet is a summary. It may not cover all possible information. If you have questions about this medicine, talk to your doctor, pharmacist, orhealth care provider.  2022 Elsevier/Gold Standard (2019-04-12 10:50:46)  Leucovorin injection What is this medication? LEUCOVORIN (loo koe VOR in) is used to prevent or treat the harmful effects of some medicines. This medicine is used to treat anemia caused by a low amount of folic acid in the body. It is also used with 5-fluorouracil (5-FU) to treatcolon cancer. This medicine may be used for other purposes; ask your health care provider orpharmacist if you have questions. What should I tell my care team before I take this medication? They need to know if you have any of these conditions: anemia from low levels of vitamin B-12 in the blood an unusual or allergic reaction to leucovorin, folic acid, other medicines, foods, dyes, or preservatives pregnant or trying to get pregnant breast-feeding How should I use this medication? This medicine is for injection into a muscle or into a vein. It is given by ahealth care professional in a hospital or clinic setting. Talk to your pediatrician regarding the use of this medicine in children.Special care may be needed. Overdosage: If you think you have taken too much of this medicine contact apoison control center or emergency room at once. NOTE: This medicine is only for you. Do not share this medicine with others. What if I miss a dose? This does not apply. What may interact with this medication? capecitabine fluorouracil phenobarbital phenytoin primidone trimethoprim-sulfamethoxazole This list may not describe all possible interactions. Give your health  care provider a list of all the medicines, herbs, non-prescription drugs, or dietary supplements you use. Also tell them if you smoke, drink alcohol, or use illegaldrugs. Some items may interact with your medicine. What should I watch for while using this medication? Your condition will be monitored carefully while you are receiving thismedicine. This medicine may increase the side effects of 5-fluorouracil, 5-FU. Tell your doctor or health care professional if you have diarrhea or mouth sores that donot get better or that get worse. What side effects may I notice from receiving this medication? Side effects that you should report to your doctor or health care professionalas soon as possible: allergic reactions like skin rash, itching or hives, swelling of the face, lips, or tongue breathing problems fever, infection mouth sores unusual bleeding or bruising unusually weak or tired Side effects that usually do not require medical attention (report to yourdoctor or health care professional if they continue or are bothersome): constipation or diarrhea loss of appetite nausea, vomiting This list may not describe all possible side effects. Call your doctor for medical advice about side effects. You may report side effects to FDA at1-800-FDA-1088. Where should I keep my medication? This drug  is given in a hospital or clinic and will not be stored at home. NOTE: This sheet is a summary. It may not cover all possible information. If you have questions about this medicine, talk to your doctor, pharmacist, orhealth care provider.  2022 Elsevier/Gold Standard (2007-12-20 16:50:29)  Fluorouracil, 5-FU injection What is this medication? FLUOROURACIL, 5-FU (flure oh YOOR a sil) is a chemotherapy drug. It slows the growth of cancer cells. This medicine is used to treat many types of cancer like breast cancer, colon or rectal cancer, pancreatic cancer, and stomachcancer. This medicine may be used for other  purposes; ask your health care provider orpharmacist if you have questions. COMMON BRAND NAME(S): Adrucil What should I tell my care team before I take this medication? They need to know if you have any of these conditions: blood disorders dihydropyrimidine dehydrogenase (DPD) deficiency infection (especially a virus infection such as chickenpox, cold sores, or herpes) kidney disease liver disease malnourished, poor nutrition recent or ongoing radiation therapy an unusual or allergic reaction to fluorouracil, other chemotherapy, other medicines, foods, dyes, or preservatives pregnant or trying to get pregnant breast-feeding How should I use this medication? This drug is given as an infusion or injection into a vein. It is administeredin a hospital or clinic by a specially trained health care professional. Talk to your pediatrician regarding the use of this medicine in children.Special care may be needed. Overdosage: If you think you have taken too much of this medicine contact apoison control center or emergency room at once. NOTE: This medicine is only for you. Do not share this medicine with others. What if I miss a dose? It is important not to miss your dose. Call your doctor or health careprofessional if you are unable to keep an appointment. What may interact with this medication? Do not take this medicine with any of the following medications: live virus vaccines This medicine may also interact with the following medications: medicines that treat or prevent blood clots like warfarin, enoxaparin, and dalteparin This list may not describe all possible interactions. Give your health care provider a list of all the medicines, herbs, non-prescription drugs, or dietary supplements you use. Also tell them if you smoke, drink alcohol, or use illegaldrugs. Some items may interact with your medicine. What should I watch for while using this medication? Visit your doctor for checks on your  progress. This drug may make you feel generally unwell. This is not uncommon, as chemotherapy can affect healthy cells as well as cancer cells. Report any side effects. Continue your course oftreatment even though you feel ill unless your doctor tells you to stop. In some cases, you may be given additional medicines to help with side effects.Follow all directions for their use. Call your doctor or health care professional for advice if you get a fever, chills or sore throat, or other symptoms of a cold or flu. Do not treat yourself. This drug decreases your body's ability to fight infections. Try toavoid being around people who are sick. This medicine may increase your risk to bruise or bleed. Call your doctor orhealth care professional if you notice any unusual bleeding. Be careful brushing and flossing your teeth or using a toothpick because you may get an infection or bleed more easily. If you have any dental work done,tell your dentist you are receiving this medicine. Avoid taking products that contain aspirin, acetaminophen, ibuprofen, naproxen, or ketoprofen unless instructed by your doctor. These medicines may hide afever. Do not become pregnant while  taking this medicine. Women should inform their doctor if they wish to become pregnant or think they might be pregnant. There is a potential for serious side effects to an unborn child. Talk to your health care professional or pharmacist for more information. Do not breast-feed aninfant while taking this medicine. Men should inform their doctor if they wish to father a child. This medicinemay lower sperm counts. Do not treat diarrhea with over the counter products. Contact your doctor ifyou have diarrhea that lasts more than 2 days or if it is severe and watery. This medicine can make you more sensitive to the sun. Keep out of the sun. If you cannot avoid being in the sun, wear protective clothing and use sunscreen.Do not use sun lamps or tanning  beds/booths. What side effects may I notice from receiving this medication? Side effects that you should report to your doctor or health care professionalas soon as possible: allergic reactions like skin rash, itching or hives, swelling of the face, lips, or tongue low blood counts - this medicine may decrease the number of white blood cells, red blood cells and platelets. You may be at increased risk for infections and bleeding. signs of infection - fever or chills, cough, sore throat, pain or difficulty passing urine signs of decreased platelets or bleeding - bruising, pinpoint red spots on the skin, black, tarry stools, blood in the urine signs of decreased red blood cells - unusually weak or tired, fainting spells, lightheadedness breathing problems changes in vision chest pain mouth sores nausea and vomiting pain, swelling, redness at site where injected pain, tingling, numbness in the hands or feet redness, swelling, or sores on hands or feet stomach pain unusual bleeding Side effects that usually do not require medical attention (report to yourdoctor or health care professional if they continue or are bothersome): changes in finger or toe nails diarrhea dry or itchy skin hair loss headache loss of appetite sensitivity of eyes to the light stomach upset unusually teary eyes This list may not describe all possible side effects. Call your doctor for medical advice about side effects. You may report side effects to FDA at1-800-FDA-1088. Where should I keep my medication? This drug is given in a hospital or clinic and will not be stored at home. NOTE: This sheet is a summary. It may not cover all possible information. If you have questions about this medicine, talk to your doctor, pharmacist, orhealth care provider.  2022 Elsevier/Gold Standard (2019-05-16 15:00:03)

## 2021-01-01 NOTE — Assessment & Plan Note (Addendum)
Her blood pressure is somewhat improved since holding bevacizumab last cycle We will resume treatment today She is reminded to check her blood pressure twice daily and to report if her blood pressure is greater than 597 systolic

## 2021-01-01 NOTE — Progress Notes (Signed)
Pt tolerated treatment well today without incidence.  Stable during and after treatment.  Vital signs stable prior to discharge.  AVS reviewed.  Discharged in stable condition ambulatory with ambulatory 87fu pump.

## 2021-01-01 NOTE — Assessment & Plan Note (Signed)
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

## 2021-01-01 NOTE — Assessment & Plan Note (Signed)
She tolerated last cycle of treatment better since discontinuation of oxaliplatin The neuropathy is stable Her main complaint is sensitivity in her mouth and gums, and related to side effects of treatment; that is also improving We will proceed with chemo as scheduled

## 2021-01-01 NOTE — Progress Notes (Signed)
Susan Martin OFFICE PROGRESS NOTE  Patient Care Team: Patient, No Pcp Per (Inactive) as PCP - General (General Practice) Brien Mates, RN as Oncology Nurse Navigator (Oncology)  ASSESSMENT & PLAN:  Metastatic colon cancer to liver [facility]) She tolerated last cycle of treatment better since discontinuation of oxaliplatin The neuropathy is stable Her main complaint is sensitivity in her mouth and gums, and related to side effects of treatment; that is also improving We will proceed with chemo as scheduled  Essential hypertension Her blood pressure is somewhat improved since holding bevacizumab last cycle We will resume treatment today She is reminded to check her blood pressure twice daily and to report if her blood pressure is greater than 462 systolic  Peripheral neuropathy due to chemotherapy [facility]) Her neuropathy is stable since discontinuation of oxaliplatin Monitor closely  Orders Placed This Encounter  Procedures   Total Protein, Urine dipstick    Standing Status:   Standing    Number of Occurrences:   2    Standing Expiration Date:   01/01/2022    All questions were answered. The patient knows to call the clinic with any problems, questions or concerns. The total time spent in the appointment was 20 minutes encounter with patients including review of chart and various tests results, discussions about plan of care and coordination of care plan   Heath Lark, MD 01/01/2021 11:00 AM  INTERVAL HISTORY: Please see below for problem oriented charting. She returns for further follow-up She denies new symptoms since her last visit She has persistent stable peripheral neuropathy No recent infection, fever or chills Denies diarrhea, nausea or mucositis  SUMMARY OF ONCOLOGIC HISTORY: Oncology History  Metastatic colon cancer to liver ([place])  05/16/2020 Initial Diagnosis   Metastatic colon cancer to liver ([place])    06/03/2020 Genetic Testing   Foundation  One:     06/04/2020 -  Chemotherapy    Patient is on Treatment Plan: COLORECTAL FOLFOX + BEVACIZUMAB Q14D         REVIEW OF SYSTEMS:   Constitutional: Denies fevers, chills or abnormal weight loss Eyes: Denies blurriness of vision Ears, nose, mouth, throat, and face: Denies mucositis or sore throat Respiratory: Denies cough, dyspnea or wheezes Cardiovascular: Denies palpitation, chest discomfort or lower extremity swelling Gastrointestinal:  Denies nausea, heartburn or change in bowel habits Skin: Denies abnormal skin rashes Lymphatics: Denies new lymphadenopathy or easy bruising Neurological:Denies numbness, tingling or new weaknesses Behavioral/Psych: Mood is stable, no new changes  All other systems were reviewed with the patient and are negative.  I have reviewed the past medical history, past surgical history, social history and family history with the patient and they are unchanged from previous note.  ALLERGIES:  has No Known Allergies.  MEDICATIONS:  Current Outpatient Medications  Medication Sig Dispense Refill   amoxicillin (AMOXIL) 500 MG capsule Take 500 mg by mouth 3 (three) times daily.     BEVACIZUMAB IV Inject 5 mg/kg into the vein every 14 (fourteen) days.     fluorouracil CALGB 70350 in sodium chloride 0.9 % 150 mL Inject 2,400 mg/m2 into the vein over 48 hr.     hydrOXYzine (VISTARIL) 25 MG capsule Take 1 capsule (25 mg total) by mouth 3 (three) times daily as needed. Prn anxiety 60 capsule 2   LEUCOVORIN CALCIUM IV Inject 400 mg/m2 into the vein every 21 ( twenty-one) days.     lidocaine-prilocaine (EMLA) cream Apply small amount to port a cath site and cover with plastic  wrap 1 hour prior to chemotherapy appointments 30 g 3   nystatin (MYCOSTATIN) 100000 UNIT/ML suspension Take by mouth.     OXALIPLATIN IV Inject 85 mg/m2 into the vein every 14 (fourteen) days.     PARoxetine (PAXIL) 40 MG tablet Take 1 tablet (40 mg total) by mouth every morning. 90 tablet  3   polyethylene glycol (MIRALAX / GLYCOLAX) packet Take 17 g by mouth every other day.     potassium chloride SA (KLOR-CON) 20 MEQ tablet Take 1 tablet (20 mEq total) by mouth daily. 30 tablet 0   triamterene-hydrochlorothiazide (MAXZIDE-25) 37.5-25 MG tablet TAKE 1 TABLET ONCE DAILY. 30 tablet 0   prochlorperazine (COMPAZINE) 10 MG tablet Take 1 tablet (10 mg total) by mouth every 6 (six) hours as needed (Nausea or vomiting). (Patient not taking: Reported on 01/01/2021) 30 tablet 1   No current facility-administered medications for this visit.   Facility-Administered Medications Ordered in Other Visits  Medication Dose Route Frequency Provider Last Rate Last Admin   bevacizumab-awwb (MVASI) 350 mg in sodium chloride 0.9 % 100 mL chemo infusion  5 mg/kg (Order-Specific) Intravenous Once Alvy Bimler, Jolean Madariaga, MD       dextrose 5 % solution   Intravenous Once Alvy Bimler, Artyom Stencel, MD       dextrose 5 % solution   Intravenous Once Alvy Bimler, Tariq Pernell, MD       fluorouracil (ADRUCIL) 3,250 mg in sodium chloride 0.9 % 85 mL chemo infusion  1,920 mg/m2 (Order-Specific) Intravenous 1 day or 1 dose Alvy Bimler, Daziya Redmond, MD       fluorouracil (ADRUCIL) chemo injection 550 mg  320 mg/m2 (Order-Specific) Intravenous Once Alvy Bimler, Nadya Hopwood, MD       heparin lock flush 100 unit/mL  500 Units Intracatheter Once PRN Alvy Bimler, Zarra Geffert, MD       leucovorin 680 mg in sodium chloride 0.9 % 250 mL infusion  400 mg/m2 (Order-Specific) Intravenous Once Alvy Bimler, Harryette Shuart, MD       sodium chloride flush (NS) 0.9 % injection 10 mL  10 mL Intracatheter PRN Leigha Olberding, MD        PHYSICAL EXAMINATION: ECOG PERFORMANCE STATUS: 1 - Symptomatic but completely ambulatory  There were no vitals filed for this visit. There were no vitals filed for this visit.  GENERAL:alert, no distress and comfortable SKIN: skin color, texture, turgor are normal, no rashes or significant lesions EYES: normal, Conjunctiva are pink and non-injected, sclera clear OROPHARYNX:no exudate, no  erythema and lips, buccal mucosa, and tongue normal  NECK: supple, thyroid normal size, non-tender, without nodularity LYMPH:  no palpable lymphadenopathy in the cervical, axillary or inguinal LUNGS: clear to auscultation and percussion with normal breathing effort HEART: regular rate & rhythm and no murmurs and no lower extremity edema ABDOMEN:abdomen soft, non-tender and normal bowel sounds Musculoskeletal:no cyanosis of digits and no clubbing  NEURO: alert & oriented x 3 with fluent speech, no focal motor/sensory deficits  LABORATORY DATA:  I have reviewed the data as listed    Component Value Date/Time   NA 136 01/01/2021 0829   NA 140 03/05/2016 1201   K 3.4 (L) 01/01/2021 0829   CL 98 01/01/2021 0829   CO2 28 01/01/2021 0829   GLUCOSE 109 (H) 01/01/2021 0829   BUN 18 01/01/2021 0829   BUN 10 03/05/2016 1201   CREATININE 0.66 01/01/2021 0829   CALCIUM 9.2 01/01/2021 0829   PROT 8.0 01/01/2021 0829   PROT 7.4 03/05/2016 1201   ALBUMIN 3.7 01/01/2021 0829   ALBUMIN 4.0 03/05/2016  1201   AST 24 01/01/2021 0829   ALT 25 01/01/2021 0829   ALKPHOS 128 (H) 01/01/2021 0829   BILITOT 0.8 01/01/2021 0829   BILITOT 0.3 03/05/2016 1201   GFRNONAA >60 01/01/2021 0829   GFRAA >60 04/01/2016 0541    No results found for: SPEP, UPEP  Lab Results  Component Value Date   WBC 5.4 01/01/2021   NEUTROABS 3.4 01/01/2021   HGB 13.6 01/01/2021   HCT 39.9 01/01/2021   MCV 96.8 01/01/2021   PLT 154 01/01/2021      Chemistry      Component Value Date/Time   NA 136 01/01/2021 0829   NA 140 03/05/2016 1201   K 3.4 (L) 01/01/2021 0829   CL 98 01/01/2021 0829   CO2 28 01/01/2021 0829   BUN 18 01/01/2021 0829   BUN 10 03/05/2016 1201   CREATININE 0.66 01/01/2021 0829      Component Value Date/Time   CALCIUM 9.2 01/01/2021 0829   ALKPHOS 128 (H) 01/01/2021 0829   AST 24 01/01/2021 0829   ALT 25 01/01/2021 0829   BILITOT 0.8 01/01/2021 0829   BILITOT 0.3 03/05/2016 1201

## 2021-01-01 NOTE — Progress Notes (Signed)
Pt presents today for treatment, pt receiving Avastin/Leuc/5FU. Potassium was 3.4 today. Pt okay for treatment per Dr. Alvy Bimler.

## 2021-01-02 LAB — CEA: CEA: 2.3 ng/mL (ref 0.0–4.7)

## 2021-01-03 ENCOUNTER — Inpatient Hospital Stay (HOSPITAL_COMMUNITY): Payer: Medicaid Other

## 2021-01-03 ENCOUNTER — Encounter (HOSPITAL_COMMUNITY): Payer: Self-pay

## 2021-01-03 ENCOUNTER — Other Ambulatory Visit: Payer: Self-pay

## 2021-01-03 VITALS — BP 128/79 | HR 71 | Temp 96.7°F | Resp 18

## 2021-01-03 DIAGNOSIS — Z5111 Encounter for antineoplastic chemotherapy: Secondary | ICD-10-CM | POA: Diagnosis not present

## 2021-01-03 DIAGNOSIS — C189 Malignant neoplasm of colon, unspecified: Secondary | ICD-10-CM

## 2021-01-03 MED ORDER — SODIUM CHLORIDE 0.9% FLUSH
10.0000 mL | INTRAVENOUS | Status: DC | PRN
  Administered 2021-01-03: 10 mL

## 2021-01-03 MED ORDER — HEPARIN SOD (PORK) LOCK FLUSH 100 UNIT/ML IV SOLN
500.0000 [IU] | Freq: Once | INTRAVENOUS | Status: AC | PRN
  Administered 2021-01-03: 500 [IU]

## 2021-01-03 NOTE — Patient Instructions (Signed)
[facility]  Discharge Instructions: Thank you for choosing [facility] to provide your oncology and hematology care.  If you have a lab appointment with the [facility], please come in thru the Main Entrance and check in at the main information desk.  Wear comfortable clothing and clothing appropriate for easy access to any Portacath or PICC line.   We strive to give you quality time with your provider. You may need to reschedule your appointment if you arrive late (15 or more minutes).  Arriving late affects you and other patients whose appointments are after yours.  Also, if you miss three or more appointments without notifying the office, you may be dismissed from the clinic at the provider's discretion.      For prescription refill requests, have your pharmacy contact our office and allow 72 hours for refills to be completed.    Today your pump was disconnected, return at scheduled time.   To help prevent nausea and vomiting after your treatment, we encourage you to take your nausea medication as directed.  BELOW ARE SYMPTOMS THAT SHOULD BE REPORTED IMMEDIATELY: *FEVER GREATER THAN 100.4 F (38 C) OR HIGHER *CHILLS OR SWEATING *NAUSEA AND VOMITING THAT IS NOT CONTROLLED WITH YOUR NAUSEA MEDICATION *UNUSUAL SHORTNESS OF BREATH *UNUSUAL BRUISING OR BLEEDING *URINARY PROBLEMS (pain or burning when urinating, or frequent urination) *BOWEL PROBLEMS (unusual diarrhea, constipation, pain near the anus) TENDERNESS IN MOUTH AND THROAT WITH OR WITHOUT PRESENCE OF ULCERS (sore throat, sores in mouth, or a toothache) UNUSUAL RASH, SWELLING OR PAIN  UNUSUAL VAGINAL DISCHARGE OR ITCHING   Items with * indicate a potential emergency and should be followed up as soon as possible or go to the Emergency Department if any problems should occur.  Please show the CHEMOTHERAPY ALERT CARD or IMMUNOTHERAPY ALERT CARD at check-in to the Emergency Department and triage  nurse.  Should you have questions after your visit or need to cancel or reschedule your appointment, please contact [facility] [phone]  and follow the prompts.  Office hours are 8:00 a.m. to 4:30 p.m. Monday - Friday. Please note that voicemails left after 4:00 p.m. may not be returned until the following business day.  We are closed weekends and major holidays. You have access to a nurse at all times for urgent questions. Please call the main number to the clinic [phone] and follow the prompts.  For any non-urgent questions, you may also contact your provider using MyChart. We now offer e-Visits for anyone 53 and older to request care online for non-urgent symptoms. For details visit [email or web address].   Also download the MyChart app! Go to the app store, search "MyChart", open the app, select [facility], and log in with your MyChart username and password.  Due to Covid, a mask is required upon entering the hospital/clinic. If you do not have a mask, one will be given to you upon arrival. For doctor visits, patients may have 1 support person aged 75 or older with them. For treatment visits, patients cannot have anyone with them due to current Covid guidelines and our immunocompromised population.

## 2021-01-03 NOTE — Progress Notes (Signed)
Patient presents today for pump d/c. Patient only complains of numbness in fingers which continues to have during treatments but she states the numbness last longer. Port flushed with good blood return noted. No bruising or swelling at site. Bandaid applied and patient discharged in satisfactory condition. VVS stable with no signs or symptoms of distressed noted.

## 2021-01-14 NOTE — Progress Notes (Signed)
[place], [place] [address]   CLINIC:  Medical Oncology/Hematology  PCP:  Patient, No Pcp Per (Inactive) None None   REASON FOR VISIT:  Follow-up for metastatic colon cancer to liver  PRIOR THERAPY:  Sigmoid colectomy and end colostomy on 04/29/2020  NGS Results:  Foundation 1 KRAS wildtype, MS--stable, TMB 4 Muts/Mb  CURRENT THERAPY: FOLFOX, Avastin & Aloxi every 2 weeks  BRIEF ONCOLOGIC HISTORY:  Oncology History  Metastatic colon cancer to liver ([place])  05/16/2020 Initial Diagnosis   Metastatic colon cancer to liver ([place])    06/03/2020 Genetic Testing   Foundation One:     06/04/2020 -  Chemotherapy    Patient is on Treatment Plan: COLORECTAL FOLFOX + BEVACIZUMAB Q14D         CANCER STAGING: Cancer Staging No matching staging information was found for the patient.  Virtual Visit via Video Note  I connected with Susan Martin on @TODAY @ at  9:30 AM EDT by a video enabled telemedicine application and verified that I am speaking with the correct person using two identifiers.  Location: Patient: clinic Provider: home  Others present: Renda Rolls, RN  I discussed the limitations of evaluation and management by telemedicine and the availability of in person appointments. The patient expressed understanding and agreed to proceed.  INTERVAL HISTORY:  [name], a 60 y.o. female, returns for routine follow-up and consideration for next cycle of chemotherapy. Susan Martin was last seen on 11/20/20.  Due for cycle #17 of FOLFOX today.   Overall, she tells me she has been feeling pretty well. She reports worsening constant tingling and numbness in her fingertips over the past 3 treatments; this has not limited her finer motor skills.   Overall, she feels ready for next cycle of chemo today.   REVIEW OF SYSTEMS:  Review of Systems  Neurological:  Positive for numbness (tingling and numbness in fingertips).   PAST  MEDICAL/SURGICAL HISTORY:  Past Medical History:  Diagnosis Date   Adenocarcinoma of colon metastatic to liver [facility]) onocology--- dr s. Delton Coombes   04-29-2020 [facility] surgery for perforated colon s/p sigmoid colectomy w/ colostomy; dx  Stage IV colon cancer mets to liver   Anxiety    Depression    Hypertension    followed by dr Glo Herring and oncology  (05-27-2020 per pt does not have pcp yet)   IDA (iron deficiency anemia)    Pulmonary nodule, right    Past Surgical History:  Procedure Laterality Date   ABDOMINAL HYSTERECTOMY  03-31-2016   @[facility]    W/  BILATERAL SALPINOOPHORECTOMY    APPLICATION OF WOUND VAC N/A 04/29/2020   Procedure: APPLICATION OF WOUND VAC;  Surgeon: Mickeal Skinner, MD;  Location: [facility];  Service: General;  Laterality: N/A;   ENDOMETRIAL ABLATION     LAPAROTOMY N/A 04/29/2020   Procedure: EXPLORATORY LAPAROTOMY;  Surgeon: Mickeal Skinner, MD;  Location: [facility];  Service: General;  Laterality: N/A;   PARTIAL COLECTOMY N/A 04/29/2020   Procedure: PARTIAL COLECTOMY WITH END COLOSTOMY;  Surgeon: Kieth Brightly Arta Bruce, MD;  Location: [facility];  Service: General;  Laterality: N/A;   PORTACATH PLACEMENT Right 05/28/2020   Procedure: INSERTION PORT-A-CATH;  Surgeon: Kinsinger, Arta Bruce, MD;  Location: [facility];  Service: General;  Laterality: Right;   SALPINGOOPHORECTOMY Left 03/31/2016   Procedure: LEFT SALPINGO OOPHORECTOMY WITH FROZEN SECTION,  ABDOMINAL HYSTERECTOMY WITH BILATERAL SALPINGO-OOPHORECTOMY;  Surgeon: Jonnie Kind, MD;  Location: [facility];  Service: Gynecology;  Laterality: Left;    SOCIAL HISTORY:  Social History   Socioeconomic History   Marital status: Divorced    Spouse name: Not on file   Number of children: Not on file   Years of education: Not on file   Highest education level: Not on file  Occupational History   Not on file  Tobacco Use   Smoking status: Former    Packs/day: 0.50    Years: 5.00    Pack years:  2.50    Types: Cigarettes    Quit date: 03/28/2015    Years since quitting: 5.8   Smokeless tobacco: Never  Vaping Use   Vaping Use: Never used  Substance and Sexual Activity   Alcohol use: No   Drug use: Never   Sexual activity: Yes    Partners: Male    Birth control/protection: Surgical  Other Topics Concern   Not on file  Social History Narrative   Not on file   Social Determinants of Health   Financial Resource Strain: Not on file  Food Insecurity: Not on file  Transportation Needs: Not on file  Physical Activity: Not on file  Stress: Not on file  Social Connections: Not on file  Intimate Partner Violence: Not on file    FAMILY HISTORY:  Family History  Problem Relation Age of Onset   Hypertension Mother    Diabetes Mother    Cirrhosis Father     CURRENT MEDICATIONS:  Current Outpatient Medications  Medication Sig Dispense Refill   amoxicillin (AMOXIL) 500 MG capsule Take 500 mg by mouth 3 (three) times daily.     BEVACIZUMAB IV Inject 5 mg/kg into the vein every 14 (fourteen) days.     fluorouracil CALGB 08144 in sodium chloride 0.9 % 150 mL Inject 2,400 mg/m2 into the vein over 48 hr.     hydrOXYzine (VISTARIL) 25 MG capsule Take 1 capsule (25 mg total) by mouth 3 (three) times daily as needed. Prn anxiety 60 capsule 2   LEUCOVORIN CALCIUM IV Inject 400 mg/m2 into the vein every 21 ( twenty-one) days.     lidocaine-prilocaine (EMLA) cream Apply small amount to port a cath site and cover with plastic wrap 1 hour prior to chemotherapy appointments 30 g 3   nystatin (MYCOSTATIN) 100000 UNIT/ML suspension Take by mouth.     PARoxetine (PAXIL) 40 MG tablet Take 1 tablet (40 mg total) by mouth every morning. 90 tablet 3   polyethylene glycol (MIRALAX / GLYCOLAX) packet Take 17 g by mouth every other day.     potassium chloride SA (KLOR-CON) 20 MEQ tablet Take 1 tablet (20 mEq total) by mouth daily. 30 tablet 0   prochlorperazine (COMPAZINE) 10 MG tablet Take 1 tablet  (10 mg total) by mouth every 6 (six) hours as needed (Nausea or vomiting). 30 tablet 1   triamterene-hydrochlorothiazide (MAXZIDE-25) 37.5-25 MG tablet TAKE 1 TABLET ONCE DAILY. 30 tablet 0   No current facility-administered medications for this visit.    ALLERGIES:  No Known Allergies  Performance status (ECOG): 1 - Symptomatic but completely ambulatory  There were no vitals filed for this visit. Wt Readings from Last 3 Encounters:  01/01/21 160 lb 12.8 oz (72.9 kg)  12/18/20 164 lb 3.2 oz (74.5 kg)  12/04/20 152 lb (68.9 kg)   LABORATORY DATA:  I have reviewed the labs as listed.  CBC Latest Ref Rng & Units 01/01/2021 12/18/2020 12/04/2020  WBC 4.0 - 10.5 K/uL 5.4 4.5 8.1  Hemoglobin  12.0 - 15.0 g/dL 13.6 12.5 12.2  Hematocrit 36.0 - 46.0 % 39.9 37.3 36.8  Platelets 150 - 400 K/uL 154 92(L) 131(L)   CMP Latest Ref Rng & Units 01/01/2021 12/18/2020 12/04/2020  Glucose 70 - 99 mg/dL 109(H) 112(H) 90  BUN 6 - 20 mg/dL 18 16 12   Creatinine 0.44 - 1.00 mg/dL 0.66 0.77 0.63  Sodium 135 - 145 mmol/L 136 134(L) 136  Potassium 3.5 - 5.1 mmol/L 3.4(L) 3.4(L) 3.0(L)  Chloride 98 - 111 mmol/L 98 99 101  CO2 22 - 32 mmol/L 28 25 28   Calcium 8.9 - 10.3 mg/dL 9.2 8.8(L) 8.8(L)  Total Protein 6.5 - 8.1 g/dL 8.0 7.7 7.2  Total Bilirubin 0.3 - 1.2 mg/dL 0.8 0.6 0.5  Alkaline Phos 38 - 126 U/L 128(H) 130(H) 150(H)  AST 15 - 41 U/L 24 34 39  ALT 0 - 44 U/L 25 27 30     DIAGNOSTIC IMAGING:  I have independently reviewed the scans and discussed with the patient. No results found.   ASSESSMENT:  1.  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 -CT chest on 05/02/2020 with no evidence of pulmonary metastatic disease. -CTAP on 05/07/2020 with multiple lesions throughout the liver,  largest in the right lobe measuring 3.9 cm, lateral segment left lobe measuring 2.6 cm and inferior right lobe confluent lesion 4.2 x 3.7 cm.  No lymphadenopathy. -CEA on 05/02/2020-60.2. -FOLFOX and bevacizumab started on 06/04/2020. -Foundation 1 testing shows MS-stable, KRAS/NRAS wild-type   2.  Iron deficiency anemia: -Feraheme on 04/30/2020 and 05/06/2020.   3.  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 -Smoked for 3 to 4 years and quit. -Mother had cancer, type unknown.  Maternal uncle had lung cancer.  Maternal aunt had lung cancer.   PLAN:  1.  Metastatic colon adenocarcinoma to liver, MS-stable: - Maintenance 5-FU and bevacizumab started on 11/20/2020.  Last treatment on 01/01/2021. - Reviewed labs from 01/01/2021 which showed CEA 2.3.  Labs from today shows normal LFTs and creatinine.  CBC was grossly normal.  UA was negative for protein. - Last CT CAP on 11/18/2020 showed stable exam with no significant changes in the hepatic metastasis.  No new lesions seen.  Multiple small bilateral lung nodules favored to be benign and stable. - Would continue maintenance 5-FU and bevacizumab today and in 2 weeks.  RTC 4 weeks with repeat CT CAP and CEA level.   2.  Iron deficiency anemia: -Her hemoglobin improved to 13.2 after parenteral iron therapy.   3.  Severe hypokalemia: - Potassium today is 2.9. - She will receive potassium supplementation in the office today. - We will start her on potassium 20 mEq daily.   4.  Peripheral neuropathy: - Last dose of oxaliplatin was in mid May. - She reported worsening of numbness tingling which is constant in the fingertips for the last 2 treatments.  She has dropped a few things. - We will start her on gabapentin 100 mg 3 times a day and titrate up as needed.   5.  Anxiety: - Continue hydroxyzine 25 mg 3 times daily as needed.   Orders placed this encounter:  No orders of the defined types were placed in this encounter.  I  provided 30  minutes of non-face-to-face time during this encounter.  Derek Jack, MD [facility]  [place] [phone]   I, Thana Ates, am acting as a scribe for Dr. Derek Jack.  I, Derek Jack MD, have reviewed the above documentation for accuracy and completeness, and I agree with the above.

## 2021-01-15 ENCOUNTER — Other Ambulatory Visit (HOSPITAL_COMMUNITY): Payer: Self-pay

## 2021-01-15 ENCOUNTER — Inpatient Hospital Stay (HOSPITAL_COMMUNITY): Payer: Medicaid Other

## 2021-01-15 ENCOUNTER — Other Ambulatory Visit: Payer: Self-pay

## 2021-01-15 ENCOUNTER — Inpatient Hospital Stay (HOSPITAL_BASED_OUTPATIENT_CLINIC_OR_DEPARTMENT_OTHER): Payer: Medicaid Other | Admitting: Hematology

## 2021-01-15 VITALS — BP 150/78 | HR 74 | Temp 99.0°F | Resp 16

## 2021-01-15 VITALS — BP 146/82 | HR 77 | Temp 97.0°F | Resp 18 | Wt 161.0 lb

## 2021-01-15 DIAGNOSIS — C787 Secondary malignant neoplasm of liver and intrahepatic bile duct: Secondary | ICD-10-CM

## 2021-01-15 DIAGNOSIS — C189 Malignant neoplasm of colon, unspecified: Secondary | ICD-10-CM

## 2021-01-15 DIAGNOSIS — Z5111 Encounter for antineoplastic chemotherapy: Secondary | ICD-10-CM | POA: Diagnosis not present

## 2021-01-15 DIAGNOSIS — E876 Hypokalemia: Secondary | ICD-10-CM

## 2021-01-15 LAB — COMPREHENSIVE METABOLIC PANEL
ALT: 30 U/L (ref 0–44)
AST: 36 U/L (ref 15–41)
Albumin: 3.7 g/dL (ref 3.5–5.0)
Alkaline Phosphatase: 113 U/L (ref 38–126)
Anion gap: 10 (ref 5–15)
BUN: 12 mg/dL (ref 6–20)
CO2: 28 mmol/L (ref 22–32)
Calcium: 8.6 mg/dL — ABNORMAL LOW (ref 8.9–10.3)
Chloride: 95 mmol/L — ABNORMAL LOW (ref 98–111)
Creatinine, Ser: 0.87 mg/dL (ref 0.44–1.00)
GFR, Estimated: >60 mL/min (ref >60)
Glucose, Bld: 117 mg/dL — ABNORMAL HIGH (ref 70–99)
Potassium: 2.9 mmol/L — ABNORMAL LOW (ref 3.5–5.1)
Sodium: 133 mmol/L — ABNORMAL LOW (ref 135–145)
Total Bilirubin: 0.8 mg/dL (ref 0.3–1.2)
Total Protein: 7.5 g/dL (ref 6.5–8.1)

## 2021-01-15 LAB — CBC WITH DIFFERENTIAL/PLATELET
Abs Immature Granulocytes: 0.01 10*3/uL (ref 0.00–0.07)
Basophils Absolute: 0 10*3/uL (ref 0.0–0.1)
Basophils Relative: 1 %
Eosinophils Absolute: 0.1 10*3/uL (ref 0.0–0.5)
Eosinophils Relative: 2 %
HCT: 38.5 % (ref 36.0–46.0)
Hemoglobin: 13.2 g/dL (ref 12.0–15.0)
Immature Granulocytes: 0 %
Lymphocytes Relative: 34 %
Lymphs Abs: 1.7 10*3/uL (ref 0.7–4.0)
MCH: 32.8 pg (ref 26.0–34.0)
MCHC: 34.3 g/dL (ref 30.0–36.0)
MCV: 95.8 fL (ref 80.0–100.0)
Monocytes Absolute: 0.6 10*3/uL (ref 0.1–1.0)
Monocytes Relative: 12 %
Neutro Abs: 2.5 10*3/uL (ref 1.7–7.7)
Neutrophils Relative %: 51 %
Platelets: 127 10*3/uL — ABNORMAL LOW (ref 150–400)
RBC: 4.02 MIL/uL (ref 3.87–5.11)
RDW: 14.4 % (ref 11.5–15.5)
WBC: 4.9 10*3/uL (ref 4.0–10.5)
nRBC: 0 % (ref 0.0–0.2)

## 2021-01-15 LAB — MAGNESIUM: Magnesium: 1.9 mg/dL (ref 1.7–2.4)

## 2021-01-15 MED ORDER — SODIUM CHLORIDE 0.9 % IV SOLN
5.0000 mg/kg | Freq: Once | INTRAVENOUS | Status: AC
  Administered 2021-01-15: 350 mg via INTRAVENOUS
  Filled 2021-01-15: qty 14

## 2021-01-15 MED ORDER — SODIUM CHLORIDE 0.9 % IV SOLN
10.0000 mg | Freq: Once | INTRAVENOUS | Status: AC
  Administered 2021-01-15: 10 mg via INTRAVENOUS
  Filled 2021-01-15: qty 10

## 2021-01-15 MED ORDER — HEPARIN SOD (PORK) LOCK FLUSH 100 UNIT/ML IV SOLN: 500.0000 [IU] | Freq: Once | INTRAVENOUS | Status: DC | PRN

## 2021-01-15 MED ORDER — SODIUM CHLORIDE 0.9 % IV SOLN
400.0000 mg/m2 | Freq: Once | INTRAVENOUS | Status: AC
  Administered 2021-01-15: 680 mg via INTRAVENOUS
  Filled 2021-01-15: qty 34

## 2021-01-15 MED ORDER — FLUOROURACIL CHEMO INJECTION 2.5 GM/50ML
320.0000 mg/m2 | Freq: Once | INTRAVENOUS | Status: AC
  Administered 2021-01-15: 550 mg via INTRAVENOUS
  Filled 2021-01-15: qty 11

## 2021-01-15 MED ORDER — SODIUM CHLORIDE 0.9% FLUSH: 10.0000 mL | INTRAVENOUS | Status: DC | PRN

## 2021-01-15 MED ORDER — POTASSIUM CHLORIDE CRYS ER 20 MEQ PO TBCR
EXTENDED_RELEASE_TABLET | ORAL | Status: AC
  Filled 2021-01-15: qty 2

## 2021-01-15 MED ORDER — SODIUM CHLORIDE 0.9 % IV SOLN
1920.0000 mg/m2 | INTRAVENOUS | Status: DC
  Administered 2021-01-15: 3250 mg via INTRAVENOUS
  Filled 2021-01-15: qty 65

## 2021-01-15 MED ORDER — SODIUM CHLORIDE 0.9 % IV SOLN: Freq: Once | INTRAVENOUS | Status: AC

## 2021-01-15 MED ORDER — PALONOSETRON HCL INJECTION 0.25 MG/5ML
0.2500 mg | Freq: Once | INTRAVENOUS | Status: AC
  Administered 2021-01-15: 0.25 mg via INTRAVENOUS

## 2021-01-15 MED ORDER — TRIAMTERENE-HCTZ 37.5-25 MG PO TABS: 1.0000 | ORAL_TABLET | Freq: Every day | ORAL | 0 refills | Status: DC

## 2021-01-15 MED ORDER — PALONOSETRON HCL INJECTION 0.25 MG/5ML
INTRAVENOUS | Status: AC
  Filled 2021-01-15: qty 5

## 2021-01-15 MED ORDER — POTASSIUM CHLORIDE CRYS ER 20 MEQ PO TBCR: 20.0000 meq | EXTENDED_RELEASE_TABLET | Freq: Every day | ORAL | 3 refills | Status: DC

## 2021-01-15 MED ORDER — PAROXETINE HCL 40 MG PO TABS
40.0000 mg | ORAL_TABLET | ORAL | 3 refills | Status: DC
Start: 2021-01-15 — End: 2021-05-27

## 2021-01-15 MED ORDER — POTASSIUM CHLORIDE CRYS ER 20 MEQ PO TBCR
40.0000 meq | EXTENDED_RELEASE_TABLET | Freq: Once | ORAL | Status: AC
Start: 2021-01-15 — End: 2021-01-15
  Administered 2021-01-15: 40 meq via ORAL

## 2021-01-15 MED ORDER — GABAPENTIN 100 MG PO CAPS: 100.0000 mg | ORAL_CAPSULE | Freq: Three times a day (TID) | ORAL | 0 refills | Status: DC

## 2021-01-15 NOTE — Patient Instructions (Addendum)
[place] at [facility] Discharge Instructions  You were seen today by Dr. Delton Coombes. He went over your recent results, and you received your treatment. You have been prescribed Gabapentin (100 mg) to be taken 3 times daily for the tingling and numbness in your fingertips. You will be scheduled for a CT scan of your chest, abdomen, and pelvis prior to your next appointment. Dr. Delton Coombes will see you back in 1 month for labs and follow up.   Thank you for choosing [place] at [facility] to provide your oncology and hematology care.  To afford each patient quality time with our provider, please arrive at least 15 minutes before your scheduled appointment time.   If you have a lab appointment with the [facility] please come in thru the Main Entrance and check in at the main information desk  You need to re-schedule your appointment should you arrive 10 or more minutes late.  We strive to give you quality time with our providers, and arriving late affects you and other patients whose appointments are after yours.  Also, if you no show three or more times for appointments you may be dismissed from the clinic at the providers discretion.     Again, thank you for choosing [facility].  Our hope is that these requests will decrease the amount of time that you wait before being seen by our physicians.       _____________________________________________________________  Should you have questions after your visit to [facility], please contact our office at [phone] between the hours of 8:00 a.m. and 4:30 p.m.  Voicemails left after 4:00 p.m. will not be returned until the following business day.  For prescription refill requests, have your pharmacy contact our office and allow 72 hours.    Cancer Center Support Programs:   > Cancer Support Group  2nd Tuesday of the month 1pm-2pm, Journey Room

## 2021-01-15 NOTE — Patient Instructions (Signed)
[facility]  Discharge Instructions: Thank you for choosing [facility] to provide your oncology and hematology care.  If you have a lab appointment with the [facility], please come in thru the Main Entrance and check in at the main information desk.  Wear comfortable clothing and clothing appropriate for easy access to any Portacath or PICC line.   We strive to give you quality time with your provider. You may need to reschedule your appointment if you arrive late (15 or more minutes).  Arriving late affects you and other patients whose appointments are after yours.  Also, if you miss three or more appointments without notifying the office, you may be dismissed from the clinic at the provider's discretion.      For prescription refill requests, have your pharmacy contact our office and allow 72 hours for refills to be completed.    Today you received the following chemotherapy and/or immunotherapy agents Avastin/Leuc/5FU.   To help prevent nausea and vomiting after your treatment, we encourage you to take your nausea medication as directed.  BELOW ARE SYMPTOMS THAT SHOULD BE REPORTED IMMEDIATELY: *FEVER GREATER THAN 100.4 F (38 C) OR HIGHER *CHILLS OR SWEATING *NAUSEA AND VOMITING THAT IS NOT CONTROLLED WITH YOUR NAUSEA MEDICATION *UNUSUAL SHORTNESS OF BREATH *UNUSUAL BRUISING OR BLEEDING *URINARY PROBLEMS (pain or burning when urinating, or frequent urination) *BOWEL PROBLEMS (unusual diarrhea, constipation, pain near the anus) TENDERNESS IN MOUTH AND THROAT WITH OR WITHOUT PRESENCE OF ULCERS (sore throat, sores in mouth, or a toothache) UNUSUAL RASH, SWELLING OR PAIN  UNUSUAL VAGINAL DISCHARGE OR ITCHING   Items with * indicate a potential emergency and should be followed up as soon as possible or go to the Emergency Department if any problems should occur.  Please show the CHEMOTHERAPY ALERT CARD or IMMUNOTHERAPY ALERT CARD at check-in to the Emergency  Department and triage nurse.  Should you have questions after your visit or need to cancel or reschedule your appointment, please contact [facility] [phone]  and follow the prompts.  Office hours are 8:00 a.m. to 4:30 p.m. Monday - Friday. Please note that voicemails left after 4:00 p.m. may not be returned until the following business day.  We are closed weekends and major holidays. You have access to a nurse at all times for urgent questions. Please call the main number to the clinic [phone] and follow the prompts.  For any non-urgent questions, you may also contact your provider using MyChart. We now offer e-Visits for anyone 66 and older to request care online for non-urgent symptoms. For details visit [email or web address].   Also download the MyChart app! Go to the app store, search "MyChart", open the app, select , and log in with your MyChart username and password.  Due to Covid, a mask is required upon entering the hospital/clinic. If you do not have a mask, one will be given to you upon arrival. For doctor visits, patients may have 1 support person aged 22 or older with them. For treatment visits, patients cannot have anyone with them due to current Covid guidelines and our immunocompromised population.

## 2021-01-15 NOTE — Progress Notes (Signed)
Patient has been assessed, vital signs and labs have been reviewed by Dr. Delton Coombes. ANC, Creatinine, LFTs, and Platelets are within treatment parameters per Dr. Delton Coombes. The patient is good to proceed with treatment at this time.  KCL 40 meq PO with treatment. Primary RN and pharmacy aware.

## 2021-01-15 NOTE — Progress Notes (Signed)
Pt here today for treatment Potassium 2.9 okay for treatment today per Dr. Raliegh Ip 40 kcl given po today.  Avastin/Leuc/5FU given today per MD orders. Tolerated infusion without adverse affects. Vital signs stable. No complaints at this time. Discharged from clinic ambulatory in stable condition. Alert and oriented x 3. F/U with [facility] as scheduled. Ambulatory 5FU pump infusing at discharge.

## 2021-01-16 ENCOUNTER — Encounter (HOSPITAL_COMMUNITY): Payer: Self-pay | Admitting: Hematology

## 2021-01-17 ENCOUNTER — Other Ambulatory Visit: Payer: Self-pay

## 2021-01-17 ENCOUNTER — Inpatient Hospital Stay (HOSPITAL_COMMUNITY): Payer: Medicaid Other

## 2021-01-17 VITALS — BP 128/72 | HR 68 | Temp 98.1°F | Resp 18

## 2021-01-17 DIAGNOSIS — C189 Malignant neoplasm of colon, unspecified: Secondary | ICD-10-CM

## 2021-01-17 DIAGNOSIS — Z5111 Encounter for antineoplastic chemotherapy: Secondary | ICD-10-CM | POA: Diagnosis not present

## 2021-01-17 MED ORDER — SODIUM CHLORIDE 0.9% FLUSH
10.0000 mL | INTRAVENOUS | Status: DC | PRN
  Administered 2021-01-17: 10 mL

## 2021-01-17 MED ORDER — HEPARIN SOD (PORK) LOCK FLUSH 100 UNIT/ML IV SOLN
500.0000 [IU] | Freq: Once | INTRAVENOUS | Status: AC | PRN
  Administered 2021-01-17: 500 [IU]

## 2021-01-17 NOTE — Patient Instructions (Signed)
[facility]  Discharge Instructions: Thank you for choosing [facility] to provide your oncology and hematology care.  If you have a lab appointment with the [facility], please come in thru the Main Entrance and check in at the main information desk.  Wear comfortable clothing and clothing appropriate for easy access to any Portacath or PICC line.   We strive to give you quality time with your provider. You may need to reschedule your appointment if you arrive late (15 or more minutes).  Arriving late affects you and other patients whose appointments are after yours.  Also, if you miss three or more appointments without notifying the office, you may be dismissed from the clinic at the provider's discretion.      For prescription refill requests, have your pharmacy contact our office and allow 72 hours for refills to be completed.    Today you received pump d/c.     BELOW ARE SYMPTOMS THAT SHOULD BE REPORTED IMMEDIATELY: *FEVER GREATER THAN 100.4 F (38 C) OR HIGHER *CHILLS OR SWEATING *NAUSEA AND VOMITING THAT IS NOT CONTROLLED WITH YOUR NAUSEA MEDICATION *UNUSUAL SHORTNESS OF BREATH *UNUSUAL BRUISING OR BLEEDING *URINARY PROBLEMS (pain or burning when urinating, or frequent urination) *BOWEL PROBLEMS (unusual diarrhea, constipation, pain near the anus) TENDERNESS IN MOUTH AND THROAT WITH OR WITHOUT PRESENCE OF ULCERS (sore throat, sores in mouth, or a toothache) UNUSUAL RASH, SWELLING OR PAIN  UNUSUAL VAGINAL DISCHARGE OR ITCHING   Items with * indicate a potential emergency and should be followed up as soon as possible or go to the Emergency Department if any problems should occur.  Please show the CHEMOTHERAPY ALERT CARD or IMMUNOTHERAPY ALERT CARD at check-in to the Emergency Department and triage nurse.  Should you have questions after your visit or need to cancel or reschedule your appointment, please contact [facility] [phone]  and  follow the prompts.  Office hours are 8:00 a.m. to 4:30 p.m. Monday - Friday. Please note that voicemails left after 4:00 p.m. may not be returned until the following business day.  We are closed weekends and major holidays. You have access to a nurse at all times for urgent questions. Please call the main number to the clinic [phone] and follow the prompts.  For any non-urgent questions, you may also contact your provider using MyChart. We now offer e-Visits for anyone 13 and older to request care online for non-urgent symptoms. For details visit [email or web address].   Also download the MyChart app! Go to the app store, search "MyChart", open the app, select [facility], and log in with your MyChart username and password.  Due to Covid, a mask is required upon entering the hospital/clinic. If you do not have a mask, one will be given to you upon arrival. For doctor visits, patients may have 1 support person aged 57 or older with them. For treatment visits, patients cannot have anyone with them due to current Covid guidelines and our immunocompromised population.

## 2021-01-17 NOTE — Progress Notes (Signed)
Pt presents today for a pump d/c per provider's order. 20H needle removed and intact. Port flush easily with 37m NS and 537mof heparin blood return noted and band-aid applied.   Tolerated well without adverse affects. Vital signs stable. No complaints at this time. Discharged from clinic ambulatory in stable condition. Alert and oriented x 3. [facility].

## 2021-01-29 ENCOUNTER — Other Ambulatory Visit (HOSPITAL_COMMUNITY): Payer: Medicaid Other

## 2021-01-29 ENCOUNTER — Inpatient Hospital Stay (HOSPITAL_COMMUNITY): Payer: Medicaid Other | Attending: Hematology

## 2021-01-29 ENCOUNTER — Encounter (HOSPITAL_COMMUNITY): Payer: Self-pay

## 2021-01-29 ENCOUNTER — Other Ambulatory Visit: Payer: Self-pay

## 2021-01-29 ENCOUNTER — Inpatient Hospital Stay (HOSPITAL_COMMUNITY): Payer: Medicaid Other

## 2021-01-29 ENCOUNTER — Other Ambulatory Visit (HOSPITAL_COMMUNITY): Payer: Self-pay | Admitting: Hematology

## 2021-01-29 ENCOUNTER — Ambulatory Visit (HOSPITAL_COMMUNITY): Payer: Medicaid Other

## 2021-01-29 VITALS — BP 136/78 | HR 70 | Temp 96.9°F | Resp 18

## 2021-01-29 DIAGNOSIS — I10 Essential (primary) hypertension: Secondary | ICD-10-CM | POA: Insufficient documentation

## 2021-01-29 DIAGNOSIS — Z5189 Encounter for other specified aftercare: Secondary | ICD-10-CM | POA: Diagnosis not present

## 2021-01-29 DIAGNOSIS — Z5111 Encounter for antineoplastic chemotherapy: Secondary | ICD-10-CM | POA: Diagnosis not present

## 2021-01-29 DIAGNOSIS — Z79899 Other long term (current) drug therapy: Secondary | ICD-10-CM | POA: Insufficient documentation

## 2021-01-29 DIAGNOSIS — C189 Malignant neoplasm of colon, unspecified: Secondary | ICD-10-CM | POA: Diagnosis not present

## 2021-01-29 DIAGNOSIS — D509 Iron deficiency anemia, unspecified: Secondary | ICD-10-CM | POA: Insufficient documentation

## 2021-01-29 DIAGNOSIS — C787 Secondary malignant neoplasm of liver and intrahepatic bile duct: Secondary | ICD-10-CM | POA: Insufficient documentation

## 2021-01-29 DIAGNOSIS — F419 Anxiety disorder, unspecified: Secondary | ICD-10-CM | POA: Diagnosis not present

## 2021-01-29 DIAGNOSIS — Z87891 Personal history of nicotine dependence: Secondary | ICD-10-CM | POA: Diagnosis not present

## 2021-01-29 DIAGNOSIS — G629 Polyneuropathy, unspecified: Secondary | ICD-10-CM | POA: Insufficient documentation

## 2021-01-29 DIAGNOSIS — E876 Hypokalemia: Secondary | ICD-10-CM | POA: Diagnosis not present

## 2021-01-29 LAB — CBC WITH DIFFERENTIAL/PLATELET
Abs Immature Granulocytes: 0.01 10*3/uL (ref 0.00–0.07)
Basophils Absolute: 0 10*3/uL (ref 0.0–0.1)
Basophils Relative: 0 %
Eosinophils Absolute: 0.1 10*3/uL (ref 0.0–0.5)
Eosinophils Relative: 3 %
HCT: 38.9 % (ref 36.0–46.0)
Hemoglobin: 13.2 g/dL (ref 12.0–15.0)
Immature Granulocytes: 0 %
Lymphocytes Relative: 27 %
Lymphs Abs: 1.2 10*3/uL (ref 0.7–4.0)
MCH: 32.8 pg (ref 26.0–34.0)
MCHC: 33.9 g/dL (ref 30.0–36.0)
MCV: 96.8 fL (ref 80.0–100.0)
Monocytes Absolute: 0.4 10*3/uL (ref 0.1–1.0)
Monocytes Relative: 9 %
Neutro Abs: 2.8 10*3/uL (ref 1.7–7.7)
Neutrophils Relative %: 61 %
Platelets: 122 10*3/uL — ABNORMAL LOW (ref 150–400)
RBC: 4.02 MIL/uL (ref 3.87–5.11)
RDW: 14.4 % (ref 11.5–15.5)
WBC: 4.6 10*3/uL (ref 4.0–10.5)
nRBC: 0 % (ref 0.0–0.2)

## 2021-01-29 LAB — COMPREHENSIVE METABOLIC PANEL
ALT: 27 U/L (ref 0–44)
AST: 30 U/L (ref 15–41)
Albumin: 3.4 g/dL — ABNORMAL LOW (ref 3.5–5.0)
Alkaline Phosphatase: 104 U/L (ref 38–126)
Anion gap: 7 (ref 5–15)
BUN: 15 mg/dL (ref 6–20)
CO2: 29 mmol/L (ref 22–32)
Calcium: 8.8 mg/dL — ABNORMAL LOW (ref 8.9–10.3)
Chloride: 98 mmol/L (ref 98–111)
Creatinine, Ser: 0.84 mg/dL (ref 0.44–1.00)
GFR, Estimated: >60 mL/min (ref >60)
Glucose, Bld: 113 mg/dL — ABNORMAL HIGH (ref 70–99)
Potassium: 3.5 mmol/L (ref 3.5–5.1)
Sodium: 134 mmol/L — ABNORMAL LOW (ref 135–145)
Total Bilirubin: 0.6 mg/dL (ref 0.3–1.2)
Total Protein: 7.4 g/dL (ref 6.5–8.1)

## 2021-01-29 LAB — MAGNESIUM: Magnesium: 1.9 mg/dL (ref 1.7–2.4)

## 2021-01-29 MED ORDER — DEXTROSE 5 % IV SOLN: Freq: Once | INTRAVENOUS | Status: DC

## 2021-01-29 MED ORDER — FLUOROURACIL CHEMO INJECTION 2.5 GM/50ML
320.0000 mg/m2 | Freq: Once | INTRAVENOUS | Status: AC
  Administered 2021-01-29: 550 mg via INTRAVENOUS
  Filled 2021-01-29: qty 11

## 2021-01-29 MED ORDER — SODIUM CHLORIDE 0.9 % IV SOLN
5.0000 mg/kg | Freq: Once | INTRAVENOUS | Status: AC
  Administered 2021-01-29: 350 mg via INTRAVENOUS
  Filled 2021-01-29: qty 14

## 2021-01-29 MED ORDER — PALONOSETRON HCL INJECTION 0.25 MG/5ML
0.2500 mg | Freq: Once | INTRAVENOUS | Status: AC
  Administered 2021-01-29: 0.25 mg via INTRAVENOUS
  Filled 2021-01-29: qty 5

## 2021-01-29 MED ORDER — SODIUM CHLORIDE 0.9% FLUSH: 10.0000 mL | INTRAVENOUS | Status: DC | PRN

## 2021-01-29 MED ORDER — SODIUM CHLORIDE 0.9 % IV SOLN
400.0000 mg/m2 | Freq: Once | INTRAVENOUS | Status: AC
  Administered 2021-01-29: 680 mg via INTRAVENOUS
  Filled 2021-01-29: qty 34

## 2021-01-29 MED ORDER — SODIUM CHLORIDE 0.9 % IV SOLN
1920.0000 mg/m2 | INTRAVENOUS | Status: DC
  Administered 2021-01-29: 3250 mg via INTRAVENOUS
  Filled 2021-01-29: qty 65

## 2021-01-29 MED ORDER — SODIUM CHLORIDE 0.9 % IV SOLN
10.0000 mg | Freq: Once | INTRAVENOUS | Status: AC
  Administered 2021-01-29: 10 mg via INTRAVENOUS
  Filled 2021-01-29: qty 10

## 2021-01-29 MED ORDER — SODIUM CHLORIDE 0.9 % IV SOLN: Freq: Once | INTRAVENOUS | Status: AC

## 2021-01-29 NOTE — Progress Notes (Signed)
Patient presents today for treatment. Labs reviewed by Dr. Delton Coombes, verbal order received; okay for treatment.  Patient states the numbness and tingling in her fingers has not improved and states she doesn't think the gabapentin is helping. Dr. Delton Coombes made aware and gave verbal orders for patient to take '200mg'$  three times a day. Patient made aware, and given instructions on AVS.  Patient tolerated chemotherapy with no complaints voiced. Side effects with management reviewed understanding verbalized. Port site clean and dry with no bruising or swelling noted at site. Good blood return noted before and after administration of chemotherapy. Chemo pump connected with no alarms noted. Patient left in satisfactory condition with VSS and no s/s of distress noted.

## 2021-01-29 NOTE — Patient Instructions (Signed)
[facility]  Discharge Instructions: Thank you for choosing [facility] to provide your oncology and hematology care.  If you have a lab appointment with the [facility], please come in thru the Main Entrance and check in at the main information desk.  Wear comfortable clothing and clothing appropriate for easy access to any Portacath or PICC line.   We strive to give you quality time with your provider. You may need to reschedule your appointment if you arrive late (15 or more minutes).  Arriving late affects you and other patients whose appointments are after yours.  Also, if you miss three or more appointments without notifying the office, you may be dismissed from the clinic at the provider's discretion.      For prescription refill requests, have your pharmacy contact our office and allow 72 hours for refills to be completed.    Today you received the following chemotherapy and/or immunotherapy agents: Bevacizumab-awwb, Leucovorin, and 5FU pump was connected.  Dr. Delton Coombes would like for you to increase gabapentin to 200 mg Three times per day, to help with numbness & tingling in fingers.   To help prevent nausea and vomiting after your treatment, we encourage you to take your nausea medication as directed.  BELOW ARE SYMPTOMS THAT SHOULD BE REPORTED IMMEDIATELY: *FEVER GREATER THAN 100.4 F (38 C) OR HIGHER *CHILLS OR SWEATING *NAUSEA AND VOMITING THAT IS NOT CONTROLLED WITH YOUR NAUSEA MEDICATION *UNUSUAL SHORTNESS OF BREATH *UNUSUAL BRUISING OR BLEEDING *URINARY PROBLEMS (pain or burning when urinating, or frequent urination) *BOWEL PROBLEMS (unusual diarrhea, constipation, pain near the anus) TENDERNESS IN MOUTH AND THROAT WITH OR WITHOUT PRESENCE OF ULCERS (sore throat, sores in mouth, or a toothache) UNUSUAL RASH, SWELLING OR PAIN  UNUSUAL VAGINAL DISCHARGE OR ITCHING   Items with * indicate a potential emergency and should be followed up as soon  as possible or go to the Emergency Department if any problems should occur.  Please show the CHEMOTHERAPY ALERT CARD or IMMUNOTHERAPY ALERT CARD at check-in to the Emergency Department and triage nurse.  Should you have questions after your visit or need to cancel or reschedule your appointment, please contact [facility] [phone]  and follow the prompts.  Office hours are 8:00 a.m. to 4:30 p.m. Monday - Friday. Please note that voicemails left after 4:00 p.m. may not be returned until the following business day.  We are closed weekends and major holidays. You have access to a nurse at all times for urgent questions. Please call the main number to the clinic [phone] and follow the prompts.  For any non-urgent questions, you may also contact your provider using MyChart. We now offer e-Visits for anyone 47 and older to request care online for non-urgent symptoms. For details visit [email or web address].   Also download the MyChart app! Go to the app store, search "MyChart", open the app, select [facility], and log in with your MyChart username and password.  Due to Covid, a mask is required upon entering the hospital/clinic. If you do not have a mask, one will be given to you upon arrival. For doctor visits, patients may have 1 support person aged 7 or older with them. For treatment visits, patients cannot have anyone with them due to current Covid guidelines and our immunocompromised population.

## 2021-01-31 ENCOUNTER — Inpatient Hospital Stay (HOSPITAL_COMMUNITY): Payer: Medicaid Other

## 2021-01-31 ENCOUNTER — Other Ambulatory Visit: Payer: Self-pay

## 2021-01-31 ENCOUNTER — Encounter (HOSPITAL_COMMUNITY): Payer: Self-pay

## 2021-01-31 VITALS — BP 150/70 | HR 64 | Temp 96.7°F | Resp 18

## 2021-01-31 DIAGNOSIS — Z5111 Encounter for antineoplastic chemotherapy: Secondary | ICD-10-CM | POA: Diagnosis not present

## 2021-01-31 DIAGNOSIS — C189 Malignant neoplasm of colon, unspecified: Secondary | ICD-10-CM

## 2021-01-31 MED ORDER — SODIUM CHLORIDE 0.9% FLUSH
10.0000 mL | INTRAVENOUS | Status: DC | PRN
  Administered 2021-01-31: 10 mL

## 2021-01-31 MED ORDER — HEPARIN SOD (PORK) LOCK FLUSH 100 UNIT/ML IV SOLN
500.0000 [IU] | Freq: Once | INTRAVENOUS | Status: AC | PRN
  Administered 2021-01-31: 500 [IU]

## 2021-01-31 NOTE — Patient Instructions (Signed)
[facility]  Discharge Instructions: Thank you for choosing [facility] to provide your oncology and hematology care.  If you have a lab appointment with the [facility], please come in thru the Main Entrance and check in at the main information desk.  Wear comfortable clothing and clothing appropriate for easy access to any Portacath or PICC line.   We strive to give you quality time with your provider. You may need to reschedule your appointment if you arrive late (15 or more minutes).  Arriving late affects you and other patients whose appointments are after yours.  Also, if you miss three or more appointments without notifying the office, you may be dismissed from the clinic at the provider's discretion.      For prescription refill requests, have your pharmacy contact our office and allow 72 hours for refills to be completed.    Today your chemo pump was disconnected. Return as scheduled.   To help prevent nausea and vomiting after your treatment, we encourage you to take your nausea medication as directed.  BELOW ARE SYMPTOMS THAT SHOULD BE REPORTED IMMEDIATELY: *FEVER GREATER THAN 100.4 F (38 C) OR HIGHER *CHILLS OR SWEATING *NAUSEA AND VOMITING THAT IS NOT CONTROLLED WITH YOUR NAUSEA MEDICATION *UNUSUAL SHORTNESS OF BREATH *UNUSUAL BRUISING OR BLEEDING *URINARY PROBLEMS (pain or burning when urinating, or frequent urination) *BOWEL PROBLEMS (unusual diarrhea, constipation, pain near the anus) TENDERNESS IN MOUTH AND THROAT WITH OR WITHOUT PRESENCE OF ULCERS (sore throat, sores in mouth, or a toothache) UNUSUAL RASH, SWELLING OR PAIN  UNUSUAL VAGINAL DISCHARGE OR ITCHING   Items with * indicate a potential emergency and should be followed up as soon as possible or go to the Emergency Department if any problems should occur.  Please show the CHEMOTHERAPY ALERT CARD or IMMUNOTHERAPY ALERT CARD at check-in to the Emergency Department and triage  nurse.  Should you have questions after your visit or need to cancel or reschedule your appointment, please contact [facility] [phone]  and follow the prompts.  Office hours are 8:00 a.m. to 4:30 p.m. Monday - Friday. Please note that voicemails left after 4:00 p.m. may not be returned until the following business day.  We are closed weekends and major holidays. You have access to a nurse at all times for urgent questions. Please call the main number to the clinic [phone] and follow the prompts.  For any non-urgent questions, you may also contact your provider using MyChart. We now offer e-Visits for anyone 23 and older to request care online for non-urgent symptoms. For details visit [email or web address].   Also download the MyChart app! Go to the app store, search "MyChart", open the app, select [facility], and log in with your MyChart username and password.  Due to Covid, a mask is required upon entering the hospital/clinic. If you do not have a mask, one will be given to you upon arrival. For doctor visits, patients may have 1 support person aged 44 or older with them. For treatment visits, patients cannot have anyone with them due to current Covid guidelines and our immunocompromised population.

## 2021-01-31 NOTE — Progress Notes (Signed)
Patient presents today for chemo pump d/c. Port flushed with good blood return noted. No bruising or swelling at site. Bandaid applied and patient discharged in satisfactory condition. VVS stable with no signs or symptoms of distressed noted.

## 2021-02-07 ENCOUNTER — Ambulatory Visit (HOSPITAL_COMMUNITY)
Admission: RE | Admit: 2021-02-07 | Discharge: 2021-02-07 | Disposition: A | Discharge status: Home / Self Care | Payer: Medicaid Other | Source: Ambulatory Visit | Attending: Hematology | Admitting: Hematology

## 2021-02-07 ENCOUNTER — Other Ambulatory Visit: Payer: Self-pay

## 2021-02-07 DIAGNOSIS — C787 Secondary malignant neoplasm of liver and intrahepatic bile duct: Secondary | ICD-10-CM | POA: Diagnosis present

## 2021-02-07 DIAGNOSIS — C189 Malignant neoplasm of colon, unspecified: Secondary | ICD-10-CM | POA: Diagnosis not present

## 2021-02-07 MED ORDER — IOHEXOL 350 MG/ML SOLN
100.0000 mL | Freq: Once | INTRAVENOUS | Status: AC | PRN
  Administered 2021-02-07: 80 mL via INTRAVENOUS

## 2021-02-11 NOTE — Progress Notes (Signed)
Susan Martin, Susan Martin [id number]   CLINIC:  Medical Oncology/Hematology  PCP:  Patient, No Pcp Per (Inactive) None None   REASON FOR VISIT:  Follow-up for metastatic colon cancer to liver  PRIOR THERAPY:  Sigmoid colectomy and end colostomy on 04/29/2020  NGS Results:  Foundation 1 KRAS wildtype, MS--stable, TMB 4 Muts/Mb  CURRENT THERAPY: FOLFOX, Avastin & Aloxi every 2 weeks  BRIEF ONCOLOGIC HISTORY:  Oncology History  Metastatic colon cancer to liver (Susan Martin)  05/16/2020 Initial Diagnosis   Metastatic colon cancer to liver (Susan Martin)   06/03/2020 Genetic Testing   Foundation One:     06/04/2020 -  Chemotherapy    Patient is on Treatment Plan: COLORECTAL FOLFOX + BEVACIZUMAB Q14D         CANCER STAGING: Cancer Staging No matching staging information was found for the patient.  INTERVAL HISTORY:  [name], a 60 y.o. female, returns for routine follow-up and consideration for next cycle of chemotherapy. Susan Martin was last seen on 01/15/21.  Due for cycle #19 of FOLFOX, Avastin & Aloxi today.   Overall, she tells me she has been feeling pretty well. She reports tingling and numbness in her hands that has caused her to occasionally drops objects she was holding. She denies ankle swelling, abdominal pain, dysuria, n/v/d, and bleeding issues, and she reports easily bruising and good appetite.   Overall, she feels ready for next cycle of chemo today.   REVIEW OF SYSTEMS:  Review of Systems  Constitutional:  Positive for fatigue (50%). Negative for appetite change.  HENT:   Negative for nosebleeds.   Cardiovascular:  Negative for leg swelling.  Gastrointestinal:  Negative for abdominal pain, blood in stool, diarrhea, nausea and vomiting.  Genitourinary:  Negative for dysuria and hematuria.   Neurological:  Positive for numbness (tingling hands and feet).  Hematological:  Bruises/bleeds easily.  All other systems reviewed and are  negative.  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 Susan Martin   04-29-2020 emergerency surgery for perforated colon s/p sigmoid colectomy w/ colostomy; dx  Stage IV colon cancer mets to liver   Anxiety    Depression    Hypertension    followed by dr Susan Martin and oncology  (05-27-2020 per pt does not have pcp yet)   IDA (iron deficiency anemia)    Pulmonary nodule, right    Past Surgical History:  Procedure Laterality Date   ABDOMINAL HYSTERECTOMY  03-31-2016   @[facility]    W/  BILATERAL SALPINOOPHORECTOMY    APPLICATION OF WOUND VAC N/A 04/29/2020   Procedure: APPLICATION OF WOUND VAC;  Surgeon: Mickeal Skinner, MD;  Location: [facility];  Service: General;  Laterality: N/A;   ENDOMETRIAL ABLATION     LAPAROTOMY N/A 04/29/2020   Procedure: EXPLORATORY LAPAROTOMY;  Surgeon: Mickeal Skinner, MD;  Location: [facility];  Service: General;  Laterality: N/A;   PARTIAL COLECTOMY N/A 04/29/2020   Procedure: PARTIAL COLECTOMY WITH END COLOSTOMY;  Surgeon: Kieth Brightly Arta Bruce, MD;  Location: [facility];  Service: General;  Laterality: N/A;   PORTACATH PLACEMENT Right 05/28/2020   Procedure: INSERTION PORT-A-CATH;  Surgeon: Kinsinger, Arta Bruce, MD;  Location: [facility];  Service: General;  Laterality: Right;   SALPINGOOPHORECTOMY Left 03/31/2016   Procedure: LEFT SALPINGO OOPHORECTOMY WITH FROZEN SECTION,  ABDOMINAL HYSTERECTOMY WITH BILATERAL SALPINGO-OOPHORECTOMY;  Surgeon: Jonnie Kind, MD;  Location: [facility];  Service: Gynecology;  Laterality: Left;    SOCIAL HISTORY:  Social History   Socioeconomic History   Marital status: Divorced    Spouse name: Not on file   Number of children: Not on file   Years of education: Not on file   Highest education level: Not on file  Occupational History   Not on file  Tobacco Use   Smoking status: Former    Packs/day: 0.50    Years:  5.00    Pack years: 2.50    Types: Cigarettes    Quit date: 03/28/2015    Years since quitting: 5.8   Smokeless tobacco: Never  Vaping Use   Vaping Use: Never used  Substance and Sexual Activity   Alcohol use: No   Drug use: Never   Sexual activity: Yes    Partners: Male    Birth control/protection: Surgical  Other Topics Concern   Not on file  Social History Narrative   Not on file   Social Determinants of Health   Financial Resource Strain: Not on file  Food Insecurity: Not on file  Transportation Needs: Not on file  Physical Activity: Not on file  Stress: Not on file  Social Connections: Not on file  Intimate Partner Violence: Not on file    FAMILY HISTORY:  Family History  Problem Relation Age of Onset   Hypertension Mother    Diabetes Mother    Cirrhosis Father     CURRENT MEDICATIONS:  Current Outpatient Medications  Medication Sig Dispense Refill   amoxicillin (AMOXIL) 500 MG capsule Take 500 mg by mouth 3 (three) times daily.     BEVACIZUMAB IV Inject 5 mg/kg into the vein every 14 (fourteen) days.     fluorouracil CALGB 93716 in sodium chloride 0.9 % 150 mL Inject 2,400 mg/m2 into the vein over 48 hr.     gabapentin (NEURONTIN) 100 MG capsule Take 1 capsule (100 mg total) by mouth 3 (three) times daily. 90 capsule 0   hydrOXYzine (VISTARIL) 25 MG capsule Take 1 capsule (25 mg total) by mouth 3 (three) times daily as needed. Prn anxiety 60 capsule 2   LEUCOVORIN CALCIUM IV Inject 400 mg/m2 into the vein every 21 ( twenty-one) days.     lidocaine-prilocaine (EMLA) cream Apply small amount to port a cath site and cover with plastic wrap 1 hour prior to chemotherapy appointments 30 g 3   nystatin (MYCOSTATIN) 100000 UNIT/ML suspension Take by mouth.     PARoxetine (PAXIL) 40 MG tablet Take 1 tablet (40 mg total) by mouth every morning. 90 tablet 3   polyethylene glycol (MIRALAX / GLYCOLAX) packet Take 17 g by mouth every other day.     potassium chloride SA  (KLOR-CON) 20 MEQ tablet Take 1 tablet (20 mEq total) by mouth daily. 30 tablet 3   prochlorperazine (COMPAZINE) 10 MG tablet Take 1 tablet (10 mg total) by mouth every 6 (six) hours as needed (Nausea or vomiting). 30 tablet 1   triamterene-hydrochlorothiazide (MAXZIDE-25) 37.5-25 MG tablet Take 1 tablet by mouth daily. 30 tablet 0   No current facility-administered medications for this visit.    ALLERGIES:  No Known Allergies  PHYSICAL EXAM:  Performance status (ECOG): 1 - Symptomatic but completely ambulatory  There were no vitals filed for this visit. Wt Readings from Last 3 Encounters:  01/29/21 165 lb 3.2 oz (74.9 kg)  01/15/21 161 lb (73 kg)  01/01/21 160 lb 12.8 oz (72.9 kg)   Physical Exam Vitals reviewed.  Constitutional:      Appearance: Normal appearance.  Cardiovascular:     Rate and Rhythm: Normal rate and regular rhythm.     Pulses: Normal pulses.     Heart sounds: Normal heart sounds.  Pulmonary:     Effort: Pulmonary effort is normal.     Breath sounds: Normal breath sounds.  Musculoskeletal:     Right lower leg: No edema.     Left lower leg: No edema.  Neurological:     General: No focal deficit present.     Mental Status: She is alert and oriented to person, place, and time.  Psychiatric:        Mood and Affect: Mood normal.        Behavior: Behavior normal.    LABORATORY DATA:  I have reviewed the labs as listed.  CBC Latest Ref Rng & Units 01/29/2021 01/15/2021 01/01/2021  WBC 4.0 - 10.5 K/uL 4.6 4.9 5.4  Hemoglobin 12.0 - 15.0 g/dL 13.2 13.2 13.6  Hematocrit 36.0 - 46.0 % 38.9 38.5 39.9  Platelets 150 - 400 K/uL 122(L) 127(L) 154   CMP Latest Ref Rng & Units 01/29/2021 01/15/2021 01/01/2021  Glucose 70 - 99 mg/dL 113(H) 117(H) 109(H)  BUN 6 - 20 mg/dL 15 12 18   Creatinine 0.44 - 1.00 mg/dL 0.84 0.87 0.66  Sodium 135 - 145 mmol/L 134(L) 133(L) 136  Potassium 3.5 - 5.1 mmol/L 3.5 2.9(L) 3.4(L)  Chloride 98 - 111 mmol/L 98 95(L) 98  CO2 22 - 32 mmol/L  29 28 28   Calcium 8.9 - 10.3 mg/dL 8.8(L) 8.6(L) 9.2  Total Protein 6.5 - 8.1 g/dL 7.4 7.5 8.0  Total Bilirubin 0.3 - 1.2 mg/dL 0.6 0.8 0.8  Alkaline Phos 38 - 126 U/L 104 113 128(H)  AST 15 - 41 U/L 30 36 24  ALT 0 - 44 U/L 27 30 25     DIAGNOSTIC IMAGING:  I have independently reviewed the scans and discussed with the patient. CT CHEST ABDOMEN PELVIS W CONTRAST  Result Date: 02/08/2021 CLINICAL DATA:  Colorectal cancer.  Interval surveillance EXAM: CT CHEST, ABDOMEN, AND PELVIS WITH CONTRAST TECHNIQUE: Multidetector CT imaging of the chest, abdomen and pelvis was performed following the standard protocol during bolus administration of intravenous contrast. CONTRAST:  37m OMNIPAQUE IOHEXOL 350 MG/ML SOLN COMPARISON:  11/18/2020 FINDINGS: CT CHEST FINDINGS Cardiovascular: Mild cardiac enlargement. No pericardial effusion. Lad coronary artery calcification. Mediastinum/Nodes: No enlarged mediastinal, hilar, or axillary lymph nodes. Thyroid gland, trachea, and esophagus demonstrate no significant findings. Lungs/Pleura: No pleural effusion. 4 mm nodule within the periphery of the right middle lobe is identified and appears unchanged from previous exam. Perifissural nodule along the oblique fissure of the left lung measures 9 mm, image 78/3. Unchanged from previous exam. Subpleural nodule within the periphery of the right base measures 4 mm, image 123/3. Unchanged from previous exam. No new or enlarging lung nodules identified. Musculoskeletal: No chest wall mass or suspicious bone lesions identified. CT ABDOMEN PELVIS FINDINGS Hepatobiliary: Multiple liver metastases are again noted. Including: Index lesion within the periphery of the right hepatic lobe measures 2.3 x 1.9 cm, image 59/2. Index lesion within segment 8/4 measures 1.5 x 1.5 cm, image 52/2. Formally 2.1 x 1.6 cm. Index lesion within segment 2 measures 1.4 x 1.4 cm, image 51/2. Stable from previous exam. There is a lesion within segment 5  which may involve the posterior wall of gallbladder. This measures 3.7 x 1.6 cm, image 69/2. Formally this measured the same. No bile  duct dilatation. Pancreas: Unremarkable. No pancreatic ductal dilatation or surrounding inflammatory changes. Spleen: Normal in size without focal abnormality. Adrenals/Urinary Tract: Normal adrenal glands. No kidney mass or hydronephrosis. Bladder is unremarkable. Stomach/Bowel: Stomach is unremarkable. Left lower quadrant colostomy and Hartman's pouch identified. No bowel wall thickening, inflammation, or distension. Vascular/Lymphatic: No significant vascular findings are present. No enlarged abdominal or pelvic lymph nodes. Reproductive: Status post hysterectomy. No adnexal masses. Other: Post treatment changes within the presacral soft tissue fat noted. Musculoskeletal: Degenerative disc disease noted at L5-S1. No suspicious bone lesions identified. IMPRESSION: 1. Multifocal liver metastases are again noted. The index lesions are either stable or minimally decreased in size in the interval. 2. No new or progressive disease identified. 3. Small nonspecific pulmonary nodules are unchanged. 4. Coronary artery calcifications. Electronically Signed   By: Kerby Moors M.D.   On: 02/08/2021 10:01     ASSESSMENT:  1.  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 -CT chest on 05/02/2020 with no evidence of pulmonary metastatic disease. -CTAP on 05/07/2020 with multiple lesions throughout the liver, largest in the right lobe measuring 3.9 cm, lateral segment left lobe measuring 2.6 cm and inferior right lobe confluent lesion 4.2 x 3.7 cm.  No lymphadenopathy. -CEA on 05/02/2020-60.2. -FOLFOX and bevacizumab started on 06/04/2020. -Foundation 1 testing shows  MS-stable, KRAS/NRAS wild-type   2.  Iron deficiency anemia: -Feraheme on 04/30/2020 and 05/06/2020.   3.  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 -Smoked for 3 to 4 years and quit. -Mother had cancer, type unknown.  Maternal uncle had lung cancer.  Maternal aunt had lung cancer.   PLAN:  1.  Metastatic colon adenocarcinoma to liver, MS-stable: -maintenance 5-FU and bevacizumab started on 11/20/2020. - She is tolerating it very well. - We reviewed CT CAP from 02/07/2021 which showed multifocal liver lesions again noted.  Index lesions are stable and some of them are slightly decreased in size. - Last CEA was 2.3 on 01/01/2021. - Reviewed labs from today which showed normal LFTs and CBC.  Platelet count is low at 117 and stable. - She will proceed with maintenance 5-FU and bevacizumab today and in 2 weeks.  RTC 4 weeks for follow-up.   2.  Iron deficiency anemia: -Hemoglobin today is 13.6.  No parenteral iron therapy needed.   3.  Severe hypokalemia: -Potassium today is 3.6.  Continue potassium 20 mEq daily.   4.  Peripheral neuropathy: -Oxaliplatin was held since mid May. - She has tingling and numbness in the fingertips which is stable.  She is able to do cross stitching. - Continue gabapentin 100 mg in the morning, 100 mg at noon, 200 mg at bedtime.   5.  Anxiety: -She will continue hydroxyzine 25 mg as needed.   Orders placed this encounter:  No orders of the defined types were placed in this encounter.    Derek Jack, MD [place] [phone]   I, Thana Ates, am acting as a scribe for Dr. Derek Jack.  I, Derek Jack MD, have reviewed the above documentation for accuracy and completeness, and I agree with the above.

## 2021-02-12 ENCOUNTER — Other Ambulatory Visit: Payer: Self-pay

## 2021-02-12 ENCOUNTER — Inpatient Hospital Stay (HOSPITAL_BASED_OUTPATIENT_CLINIC_OR_DEPARTMENT_OTHER): Payer: Medicaid Other | Admitting: Hematology

## 2021-02-12 ENCOUNTER — Inpatient Hospital Stay (HOSPITAL_COMMUNITY): Payer: Medicaid Other

## 2021-02-12 ENCOUNTER — Other Ambulatory Visit (HOSPITAL_COMMUNITY): Payer: Medicaid Other

## 2021-02-12 ENCOUNTER — Ambulatory Visit (HOSPITAL_COMMUNITY): Payer: Medicaid Other | Admitting: Hematology

## 2021-02-12 VITALS — BP 167/72 | HR 69 | Temp 97.1°F | Resp 18 | Ht 63.0 in | Wt 165.0 lb

## 2021-02-12 DIAGNOSIS — C189 Malignant neoplasm of colon, unspecified: Secondary | ICD-10-CM

## 2021-02-12 DIAGNOSIS — C787 Secondary malignant neoplasm of liver and intrahepatic bile duct: Secondary | ICD-10-CM

## 2021-02-12 DIAGNOSIS — Z5111 Encounter for antineoplastic chemotherapy: Secondary | ICD-10-CM | POA: Diagnosis not present

## 2021-02-12 LAB — URINALYSIS, DIPSTICK ONLY
Bilirubin Urine: NEGATIVE
Glucose, UA: NEGATIVE mg/dL
Hgb urine dipstick: NEGATIVE
Ketones, ur: NEGATIVE mg/dL
Nitrite: NEGATIVE
Protein, ur: NEGATIVE mg/dL
Specific Gravity, Urine: 1.014 (ref 1.005–1.030)
pH: 7 (ref 5.0–8.0)

## 2021-02-12 LAB — CBC WITH DIFFERENTIAL/PLATELET
Abs Immature Granulocytes: 0.01 10*3/uL (ref 0.00–0.07)
Basophils Absolute: 0 10*3/uL (ref 0.0–0.1)
Basophils Relative: 1 %
Eosinophils Absolute: 0.1 10*3/uL (ref 0.0–0.5)
Eosinophils Relative: 3 %
HCT: 39.8 % (ref 36.0–46.0)
Hemoglobin: 13.6 g/dL (ref 12.0–15.0)
Immature Granulocytes: 0 %
Lymphocytes Relative: 39 %
Lymphs Abs: 1.7 10*3/uL (ref 0.7–4.0)
MCH: 32.7 pg (ref 26.0–34.0)
MCHC: 34.2 g/dL (ref 30.0–36.0)
MCV: 95.7 fL (ref 80.0–100.0)
Monocytes Absolute: 0.5 10*3/uL (ref 0.1–1.0)
Monocytes Relative: 11 %
Neutro Abs: 1.9 10*3/uL (ref 1.7–7.7)
Neutrophils Relative %: 46 %
Platelets: 117 10*3/uL — ABNORMAL LOW (ref 150–400)
RBC: 4.16 MIL/uL (ref 3.87–5.11)
RDW: 14.6 % (ref 11.5–15.5)
WBC: 4.2 10*3/uL (ref 4.0–10.5)
nRBC: 0 % (ref 0.0–0.2)

## 2021-02-12 LAB — COMPREHENSIVE METABOLIC PANEL
ALT: 27 U/L (ref 0–44)
AST: 30 U/L (ref 15–41)
Albumin: 3.7 g/dL (ref 3.5–5.0)
Alkaline Phosphatase: 90 U/L (ref 38–126)
Anion gap: 7 (ref 5–15)
BUN: 12 mg/dL (ref 6–20)
CO2: 28 mmol/L (ref 22–32)
Calcium: 8.7 mg/dL — ABNORMAL LOW (ref 8.9–10.3)
Chloride: 98 mmol/L (ref 98–111)
Creatinine, Ser: 0.83 mg/dL (ref 0.44–1.00)
GFR, Estimated: >60 mL/min (ref >60)
Glucose, Bld: 97 mg/dL (ref 70–99)
Potassium: 3.6 mmol/L (ref 3.5–5.1)
Sodium: 133 mmol/L — ABNORMAL LOW (ref 135–145)
Total Bilirubin: 0.7 mg/dL (ref 0.3–1.2)
Total Protein: 7.6 g/dL (ref 6.5–8.1)

## 2021-02-12 LAB — MAGNESIUM: Magnesium: 2 mg/dL (ref 1.7–2.4)

## 2021-02-12 MED ORDER — SODIUM CHLORIDE 0.9 % IV SOLN
10.0000 mg | Freq: Once | INTRAVENOUS | Status: AC
  Administered 2021-02-12: 10 mg via INTRAVENOUS
  Filled 2021-02-12: qty 10

## 2021-02-12 MED ORDER — SODIUM CHLORIDE 0.9 % IV SOLN
5.0000 mg/kg | Freq: Once | INTRAVENOUS | Status: AC
  Administered 2021-02-12: 350 mg via INTRAVENOUS
  Filled 2021-02-12: qty 14

## 2021-02-12 MED ORDER — SODIUM CHLORIDE 0.9 % IV SOLN
400.0000 mg/m2 | Freq: Once | INTRAVENOUS | Status: AC
  Administered 2021-02-12: 680 mg via INTRAVENOUS
  Filled 2021-02-12: qty 34

## 2021-02-12 MED ORDER — SODIUM CHLORIDE 0.9 % IV SOLN
1920.0000 mg/m2 | INTRAVENOUS | Status: DC
  Administered 2021-02-12: 3250 mg via INTRAVENOUS
  Filled 2021-02-12: qty 65

## 2021-02-12 MED ORDER — FLUOROURACIL CHEMO INJECTION 2.5 GM/50ML
320.0000 mg/m2 | Freq: Once | INTRAVENOUS | Status: AC
  Administered 2021-02-12: 550 mg via INTRAVENOUS
  Filled 2021-02-12: qty 11

## 2021-02-12 MED ORDER — SODIUM CHLORIDE 0.9 % IV SOLN: Freq: Once | INTRAVENOUS | Status: AC

## 2021-02-12 MED ORDER — PALONOSETRON HCL INJECTION 0.25 MG/5ML
0.2500 mg | Freq: Once | INTRAVENOUS | Status: AC
  Administered 2021-02-12: 0.25 mg via INTRAVENOUS
  Filled 2021-02-12: qty 5

## 2021-02-12 NOTE — Progress Notes (Signed)
Patient has been assessed, vital signs and labs have been reviewed by Dr. Katragadda. ANC, Creatinine, LFTs, and Platelets are within treatment parameters per Dr. Katragadda. The patient is good to proceed with treatment at this time. Primary RN and pharmacy aware.  

## 2021-02-12 NOTE — Patient Instructions (Signed)
[facility]  Discharge Instructions: Thank you for choosing [facility] to provide your oncology and hematology care.  If you have a lab appointment with the [facility], please come in thru the Main Entrance and check in at the main information desk.  Wear comfortable clothing and clothing appropriate for easy access to any Portacath or PICC line.   We strive to give you quality time with your provider. You may need to reschedule your appointment if you arrive late (15 or more minutes).  Arriving late affects you and other patients whose appointments are after yours.  Also, if you miss three or more appointments without notifying the office, you may be dismissed from the clinic at the provider's discretion.      For prescription refill requests, have your pharmacy contact our office and allow 72 hours for refills to be completed.    Today you received the following chemotherapy and/or immunotherapy agents Avastin/Leucovorin/ 5FU .       To help prevent nausea and vomiting after your treatment, we encourage you to take your nausea medication as directed.  BELOW ARE SYMPTOMS THAT SHOULD BE REPORTED IMMEDIATELY: *FEVER GREATER THAN 100.4 F (38 C) OR HIGHER *CHILLS OR SWEATING *NAUSEA AND VOMITING THAT IS NOT CONTROLLED WITH YOUR NAUSEA MEDICATION *UNUSUAL SHORTNESS OF BREATH *UNUSUAL BRUISING OR BLEEDING *URINARY PROBLEMS (pain or burning when urinating, or frequent urination) *BOWEL PROBLEMS (unusual diarrhea, constipation, pain near the anus) TENDERNESS IN MOUTH AND THROAT WITH OR WITHOUT PRESENCE OF ULCERS (sore throat, sores in mouth, or a toothache) UNUSUAL RASH, SWELLING OR PAIN  UNUSUAL VAGINAL DISCHARGE OR ITCHING   Items with * indicate a potential emergency and should be followed up as soon as possible or go to the Emergency Department if any problems should occur.  Please show the CHEMOTHERAPY ALERT CARD or IMMUNOTHERAPY ALERT CARD at check-in to the  Emergency Department and triage nurse.  Should you have questions after your visit or need to cancel or reschedule your appointment, please contact [facility] [phone]  and follow the prompts.  Office hours are 8:00 a.m. to 4:30 p.m. Monday - Friday. Please note that voicemails left after 4:00 p.m. may not be returned until the following business day.  We are closed weekends and major holidays. You have access to a nurse at all times for urgent questions. Please call the main number to the clinic [phone] and follow the prompts.  For any non-urgent questions, you may also contact your provider using MyChart. We now offer e-Visits for anyone 6 and older to request care online for non-urgent symptoms. For details visit [email or web address].   Also download the MyChart app! Go to the app store, search "MyChart", open the app, select [facility], and log in with your MyChart username and password.  Due to Covid, a mask is required upon entering the hospital/clinic. If you do not have a mask, one will be given to you upon arrival. For doctor visits, patients may have 1 support person aged 64 or older with them. For treatment visits, patients cannot have anyone with them due to current Covid guidelines and our immunocompromised population.

## 2021-02-12 NOTE — Progress Notes (Signed)
Patient presents today for treatment and follow up visit with Dr. Delton Coombes. Labs pending. MAR reviewed and updated. Patient denies any changes since her last treatment. Patient has complaints of numbness in her hands and feet bilateral with no change in severity. Vital signs within parameters for treatment.   Message received from [facility] to proceed with treatment.   Treatment given today per MD orders. Tolerated infusion without adverse affects. Vital signs stable. No complaints at this time.  5 FU pump verified with patient and RUN noted on the screen. Discharged from clinic ambulatory in stable condition. Alert and oriented x 3. F/U with [facility] as scheduled.

## 2021-02-12 NOTE — Patient Instructions (Addendum)
[facility] Discharge Instructions  You were seen today by Dr. Katragadda. He went over your recent results, and you received your treatment. Dr. Katragadda will see you back in 1 month for labs and follow up.   Thank you for choosing  Can[facility] Center at [facility] to provide your oncology and hematology care.  To afford each patient quality time with our provider, please arrive at least 15 minutes before your scheduled appointment time.   If you have a lab appointment with the Cancer Center please come in thru the Main Entrance and check in at the main information desk  You need to re-schedule your appointment should you arrive 10 or more minutes late.  We strive to give you quality time with our providers, and arriving late affects you and other patients whose appointments are after yours.  Also, if you no show three or more times for appointments you may be dismissed from the clinic at the providers discretion.     Again, thank you for choosing [facility].  Our hope is that these requests will decrease the amount of time that you wait before being seen by our physicians.       _____________________________________________________________  Should you have questions after your visit to [facility], please contact our office at [phone] between the hours of 8:00 a.m. and 4:30 p.m.  Voicemails left after 4:00 p.m. will not be returned until the following business day.  For prescription refill requests, have your pharmacy contact our office and allow 72 hours.    Cancer Center Support Programs:   > Cancer Support Group  2nd Tuesday of the month 1pm-2pm, Journey Room   

## 2021-02-13 LAB — CEA: CEA: 2.2 ng/mL (ref 0.0–4.7)

## 2021-02-14 ENCOUNTER — Other Ambulatory Visit: Payer: Self-pay

## 2021-02-14 ENCOUNTER — Inpatient Hospital Stay (HOSPITAL_COMMUNITY): Payer: Medicaid Other

## 2021-02-14 VITALS — BP 138/77 | HR 70 | Temp 97.9°F | Resp 18

## 2021-02-14 DIAGNOSIS — Z5111 Encounter for antineoplastic chemotherapy: Secondary | ICD-10-CM | POA: Diagnosis not present

## 2021-02-14 DIAGNOSIS — C787 Secondary malignant neoplasm of liver and intrahepatic bile duct: Secondary | ICD-10-CM

## 2021-02-14 DIAGNOSIS — C189 Malignant neoplasm of colon, unspecified: Secondary | ICD-10-CM

## 2021-02-14 MED ORDER — HEPARIN SOD (PORK) LOCK FLUSH 100 UNIT/ML IV SOLN
500.0000 [IU] | Freq: Once | INTRAVENOUS | Status: AC | PRN
  Administered 2021-02-14: 500 [IU]

## 2021-02-14 MED ORDER — SODIUM CHLORIDE 0.9% FLUSH
10.0000 mL | INTRAVENOUS | Status: DC | PRN
  Administered 2021-02-14: 10 mL

## 2021-02-14 NOTE — Progress Notes (Signed)
Pt presents today for chemo pump d/c per provider's order. Port flushed easily with good blood return. Port flushed with 60m NS and 531mheparin. Needle removed intact and band-aid applied.  Tolerated infusion without adverse affects. Vital signs stable. No complaints at this time. Discharged from clinic ambulatory in stable condition. Alert and oriented x 3. [facility] scheduled.

## 2021-02-14 NOTE — Patient Instructions (Signed)
[facility]  Discharge Instructions: Thank you for choosing [facility] to provide your oncology and hematology care.  If you have a lab appointment with the [facility], please come in thru the Main Entrance and check in at the main information desk.  Wear comfortable clothing and clothing appropriate for easy access to any Portacath or PICC line.   We strive to give you quality time with your provider. You may need to reschedule your appointment if you arrive late (15 or more minutes).  Arriving late affects you and other patients whose appointments are after yours.  Also, if you miss three or more appointments without notifying the office, you may be dismissed from the clinic at the provider's discretion.      For prescription refill requests, have your pharmacy contact our office and allow 72 hours for refills to be completed.    Today you received pump d/c.       BELOW ARE SYMPTOMS THAT SHOULD BE REPORTED IMMEDIATELY: *FEVER GREATER THAN 100.4 F (38 C) OR HIGHER *CHILLS OR SWEATING *NAUSEA AND VOMITING THAT IS NOT CONTROLLED WITH YOUR NAUSEA MEDICATION *UNUSUAL SHORTNESS OF BREATH *UNUSUAL BRUISING OR BLEEDING *URINARY PROBLEMS (pain or burning when urinating, or frequent urination) *BOWEL PROBLEMS (unusual diarrhea, constipation, pain near the anus) TENDERNESS IN MOUTH AND THROAT WITH OR WITHOUT PRESENCE OF ULCERS (sore throat, sores in mouth, or a toothache) UNUSUAL RASH, SWELLING OR PAIN  UNUSUAL VAGINAL DISCHARGE OR ITCHING   Items with * indicate a potential emergency and should be followed up as soon as possible or go to the Emergency Department if any problems should occur.  Please show the CHEMOTHERAPY ALERT CARD or IMMUNOTHERAPY ALERT CARD at check-in to the Emergency Department and triage nurse.  Should you have questions after your visit or need to cancel or reschedule your appointment, please contact [facility] [phone]   and follow the prompts.  Office hours are 8:00 a.m. to 4:30 p.m. Monday - Friday. Please note that voicemails left after 4:00 p.m. may not be returned until the following business day.  We are closed weekends and major holidays. You have access to a nurse at all times for urgent questions. Please call the main number to the clinic [phone] and follow the prompts.  For any non-urgent questions, you may also contact your provider using MyChart. We now offer e-Visits for anyone 17 and older to request care online for non-urgent symptoms. For details visit [email or web address].   Also download the MyChart app! Go to the app store, search "MyChart", open the app, select [facility], and log in with your MyChart username and password.  Due to Covid, a mask is required upon entering the hospital/clinic. If you do not have a mask, one will be given to you upon arrival. For doctor visits, patients may have 1 support person aged 57 or older with them. For treatment visits, patients cannot have anyone with them due to current Covid guidelines and our immunocompromised population.

## 2021-02-21 ENCOUNTER — Other Ambulatory Visit (HOSPITAL_COMMUNITY): Payer: Self-pay | Admitting: Hematology

## 2021-02-24 ENCOUNTER — Other Ambulatory Visit (HOSPITAL_COMMUNITY): Payer: Self-pay

## 2021-02-24 MED ORDER — TRIAMTERENE-HCTZ 37.5-25 MG PO TABS: 1.0000 | ORAL_TABLET | Freq: Every day | ORAL | 3 refills | Status: DC

## 2021-02-24 MED ORDER — GABAPENTIN 100 MG PO CAPS: 100.0000 mg | ORAL_CAPSULE | Freq: Three times a day (TID) | ORAL | 3 refills | Status: DC

## 2021-02-25 ENCOUNTER — Inpatient Hospital Stay (HOSPITAL_COMMUNITY): Payer: Medicaid Other

## 2021-02-25 ENCOUNTER — Encounter (HOSPITAL_COMMUNITY): Payer: Self-pay | Admitting: Hematology

## 2021-02-25 ENCOUNTER — Other Ambulatory Visit: Payer: Self-pay

## 2021-02-25 VITALS — BP 139/76 | HR 67 | Temp 98.0°F | Resp 18

## 2021-02-25 DIAGNOSIS — Z5111 Encounter for antineoplastic chemotherapy: Secondary | ICD-10-CM | POA: Diagnosis not present

## 2021-02-25 DIAGNOSIS — C787 Secondary malignant neoplasm of liver and intrahepatic bile duct: Secondary | ICD-10-CM

## 2021-02-25 DIAGNOSIS — C189 Malignant neoplasm of colon, unspecified: Secondary | ICD-10-CM

## 2021-02-25 LAB — CBC WITH DIFFERENTIAL/PLATELET
Abs Immature Granulocytes: 0.03 10*3/uL (ref 0.00–0.07)
Basophils Absolute: 0 10*3/uL (ref 0.0–0.1)
Basophils Relative: 1 %
Eosinophils Absolute: 0.2 10*3/uL (ref 0.0–0.5)
Eosinophils Relative: 3 %
HCT: 40 % (ref 36.0–46.0)
Hemoglobin: 13.4 g/dL (ref 12.0–15.0)
Immature Granulocytes: 1 %
Lymphocytes Relative: 28 %
Lymphs Abs: 1.5 10*3/uL (ref 0.7–4.0)
MCH: 32.1 pg (ref 26.0–34.0)
MCHC: 33.5 g/dL (ref 30.0–36.0)
MCV: 95.9 fL (ref 80.0–100.0)
Monocytes Absolute: 0.5 10*3/uL (ref 0.1–1.0)
Monocytes Relative: 10 %
Neutro Abs: 3.1 10*3/uL (ref 1.7–7.7)
Neutrophils Relative %: 57 %
Platelets: 112 10*3/uL — ABNORMAL LOW (ref 150–400)
RBC: 4.17 MIL/uL (ref 3.87–5.11)
RDW: 14.8 % (ref 11.5–15.5)
WBC: 5.4 10*3/uL (ref 4.0–10.5)
nRBC: 0 % (ref 0.0–0.2)

## 2021-02-25 LAB — MAGNESIUM: Magnesium: 2.1 mg/dL (ref 1.7–2.4)

## 2021-02-25 LAB — COMPREHENSIVE METABOLIC PANEL
ALT: 34 U/L (ref 0–44)
AST: 34 U/L (ref 15–41)
Albumin: 3.4 g/dL — ABNORMAL LOW (ref 3.5–5.0)
Alkaline Phosphatase: 96 U/L (ref 38–126)
Anion gap: 7 (ref 5–15)
BUN: 13 mg/dL (ref 6–20)
CO2: 29 mmol/L (ref 22–32)
Calcium: 8.7 mg/dL — ABNORMAL LOW (ref 8.9–10.3)
Chloride: 100 mmol/L (ref 98–111)
Creatinine, Ser: 0.9 mg/dL (ref 0.44–1.00)
GFR, Estimated: >60 mL/min (ref >60)
Glucose, Bld: 106 mg/dL — ABNORMAL HIGH (ref 70–99)
Potassium: 3.7 mmol/L (ref 3.5–5.1)
Sodium: 136 mmol/L (ref 135–145)
Total Bilirubin: 0.6 mg/dL (ref 0.3–1.2)
Total Protein: 7.3 g/dL (ref 6.5–8.1)

## 2021-02-25 MED ORDER — PALONOSETRON HCL INJECTION 0.25 MG/5ML
0.2500 mg | Freq: Once | INTRAVENOUS | Status: AC
  Administered 2021-02-25: 0.25 mg via INTRAVENOUS
  Filled 2021-02-25: qty 5

## 2021-02-25 MED ORDER — SODIUM CHLORIDE 0.9 % IV SOLN
740.0000 mg | Freq: Once | INTRAVENOUS | Status: AC
  Administered 2021-02-25: 740 mg via INTRAVENOUS
  Filled 2021-02-25: qty 37

## 2021-02-25 MED ORDER — SODIUM CHLORIDE 0.9 % IV SOLN
400.0000 mg | Freq: Once | INTRAVENOUS | Status: AC
  Administered 2021-02-25: 400 mg via INTRAVENOUS
  Filled 2021-02-25: qty 16

## 2021-02-25 MED ORDER — SODIUM CHLORIDE 0.9 % IV SOLN
10.0000 mg | Freq: Once | INTRAVENOUS | Status: AC
  Administered 2021-02-25: 10 mg via INTRAVENOUS
  Filled 2021-02-25: qty 10

## 2021-02-25 MED ORDER — FLUOROURACIL CHEMO INJECTION 5 GM/100ML
3550.0000 mg | INTRAVENOUS | Status: DC
  Administered 2021-02-25: 3550 mg via INTRAVENOUS
  Filled 2021-02-25: qty 71

## 2021-02-25 MED ORDER — SODIUM CHLORIDE 0.9% FLUSH: 10.0000 mL | INTRAVENOUS | Status: DC | PRN

## 2021-02-25 MED ORDER — FLUOROURACIL CHEMO INJECTION 2.5 GM/50ML
592.0000 mg | Freq: Once | INTRAVENOUS | Status: AC
  Administered 2021-02-25: 600 mg via INTRAVENOUS
  Filled 2021-02-25: qty 12

## 2021-02-25 MED ORDER — HEPARIN SOD (PORK) LOCK FLUSH 100 UNIT/ML IV SOLN: 500.0000 [IU] | Freq: Once | INTRAVENOUS | Status: DC | PRN

## 2021-02-25 MED ORDER — SODIUM CHLORIDE 0.9 % IV SOLN: Freq: Once | INTRAVENOUS | Status: AC

## 2021-02-25 NOTE — Progress Notes (Signed)
Ok to increase doses based on weight gain.  New weight 77.3 kg (1.85 m2)  T.O. Dr Rhys Martini, PharmD

## 2021-02-25 NOTE — Progress Notes (Signed)
Pt presents today for Avastin/Leuc/5FU per provider's order. Vital signs stable and labs within normal parameters for treatment. Pt c/o tooth pain at times, pt will make dental appointment in the future Dr. Delton Coombes notified.  Avastin/Leuc/5FU given today per MD orders. Tolerated infusion without adverse affects. Vital signs stable. No complaints at this time. Discharged from clinic ambulatory in stable condition. Alert and oriented x 3. F/U with [facility] as scheduled. 5FU ambulatory pump infusing.

## 2021-02-25 NOTE — Patient Instructions (Signed)
[facility]  Discharge Instructions: Thank you for choosing [facility] to provide your oncology and hematology care.  If you have a lab appointment with the [facility], please come in thru the Main Entrance and check in at the main information desk.  Wear comfortable clothing and clothing appropriate for easy access to any Portacath or PICC line.   We strive to give you quality time with your provider. You may need to reschedule your appointment if you arrive late (15 or more minutes).  Arriving late affects you and other patients whose appointments are after yours.  Also, if you miss three or more appointments without notifying the office, you may be dismissed from the clinic at the provider's discretion.      For prescription refill requests, have your pharmacy contact our office and allow 72 hours for refills to be completed.    Today you received the following chemotherapy and/or immunotherapy agents Avastin/Leuc/5FU.   To help prevent nausea and vomiting after your treatment, we encourage you to take your nausea medication as directed.  BELOW ARE SYMPTOMS THAT SHOULD BE REPORTED IMMEDIATELY: *FEVER GREATER THAN 100.4 F (38 C) OR HIGHER *CHILLS OR SWEATING *NAUSEA AND VOMITING THAT IS NOT CONTROLLED WITH YOUR NAUSEA MEDICATION *UNUSUAL SHORTNESS OF BREATH *UNUSUAL BRUISING OR BLEEDING *URINARY PROBLEMS (pain or burning when urinating, or frequent urination) *BOWEL PROBLEMS (unusual diarrhea, constipation, pain near the anus) TENDERNESS IN MOUTH AND THROAT WITH OR WITHOUT PRESENCE OF ULCERS (sore throat, sores in mouth, or a toothache) UNUSUAL RASH, SWELLING OR PAIN  UNUSUAL VAGINAL DISCHARGE OR ITCHING   Items with * indicate a potential emergency and should be followed up as soon as possible or go to the Emergency Department if any problems should occur.  Please show the CHEMOTHERAPY ALERT CARD or IMMUNOTHERAPY ALERT CARD at check-in to the Emergency  Department and triage nurse.  Should you have questions after your visit or need to cancel or reschedule your appointment, please contact [facility] [phone]  and follow the prompts.  Office hours are 8:00 a.m. to 4:30 p.m. Monday - Friday. Please note that voicemails left after 4:00 p.m. may not be returned until the following business day.  We are closed weekends and major holidays. You have access to a nurse at all times for urgent questions. Please call the main number to the clinic [phone] and follow the prompts.  For any non-urgent questions, you may also contact your provider using MyChart. We now offer e-Visits for anyone 3 and older to request care online for non-urgent symptoms. For details visit [email or web address].   Also download the MyChart app! Go to the app store, search "MyChart", open the app, select [facility], and log in with your MyChart username and password.  Due to Covid, a mask is required upon entering the hospital/clinic. If you do not have a mask, one will be given to you upon arrival. For doctor visits, patients may have 1 support person aged 62 or older with them. For treatment visits, patients cannot have anyone with them due to current Covid guidelines and our immunocompromised population.

## 2021-02-27 ENCOUNTER — Encounter (HOSPITAL_COMMUNITY): Payer: Self-pay

## 2021-02-27 ENCOUNTER — Inpatient Hospital Stay (HOSPITAL_COMMUNITY): Payer: Medicaid Other | Attending: Hematology

## 2021-02-27 ENCOUNTER — Other Ambulatory Visit: Payer: Self-pay

## 2021-02-27 VITALS — BP 146/66 | HR 70 | Temp 97.3°F | Resp 18

## 2021-02-27 DIAGNOSIS — E876 Hypokalemia: Secondary | ICD-10-CM | POA: Diagnosis not present

## 2021-02-27 DIAGNOSIS — Z79899 Other long term (current) drug therapy: Secondary | ICD-10-CM | POA: Insufficient documentation

## 2021-02-27 DIAGNOSIS — R911 Solitary pulmonary nodule: Secondary | ICD-10-CM | POA: Insufficient documentation

## 2021-02-27 DIAGNOSIS — F419 Anxiety disorder, unspecified: Secondary | ICD-10-CM | POA: Insufficient documentation

## 2021-02-27 DIAGNOSIS — C187 Malignant neoplasm of sigmoid colon: Secondary | ICD-10-CM | POA: Insufficient documentation

## 2021-02-27 DIAGNOSIS — Z5111 Encounter for antineoplastic chemotherapy: Secondary | ICD-10-CM | POA: Diagnosis not present

## 2021-02-27 DIAGNOSIS — Z87891 Personal history of nicotine dependence: Secondary | ICD-10-CM | POA: Insufficient documentation

## 2021-02-27 DIAGNOSIS — C787 Secondary malignant neoplasm of liver and intrahepatic bile duct: Secondary | ICD-10-CM | POA: Diagnosis not present

## 2021-02-27 DIAGNOSIS — D509 Iron deficiency anemia, unspecified: Secondary | ICD-10-CM | POA: Diagnosis not present

## 2021-02-27 DIAGNOSIS — G629 Polyneuropathy, unspecified: Secondary | ICD-10-CM | POA: Insufficient documentation

## 2021-02-27 DIAGNOSIS — Z809 Family history of malignant neoplasm, unspecified: Secondary | ICD-10-CM | POA: Insufficient documentation

## 2021-02-27 DIAGNOSIS — C189 Malignant neoplasm of colon, unspecified: Secondary | ICD-10-CM

## 2021-02-27 DIAGNOSIS — I10 Essential (primary) hypertension: Secondary | ICD-10-CM | POA: Diagnosis not present

## 2021-02-27 DIAGNOSIS — Z801 Family history of malignant neoplasm of trachea, bronchus and lung: Secondary | ICD-10-CM | POA: Diagnosis not present

## 2021-02-27 MED ORDER — SODIUM CHLORIDE 0.9% FLUSH
10.0000 mL | INTRAVENOUS | Status: DC | PRN
  Administered 2021-02-27: 10 mL

## 2021-02-27 MED ORDER — HEPARIN SOD (PORK) LOCK FLUSH 100 UNIT/ML IV SOLN
500.0000 [IU] | Freq: Once | INTRAVENOUS | Status: AC | PRN
  Administered 2021-02-27: 500 [IU]

## 2021-02-27 NOTE — Patient Instructions (Signed)
[facility]  Discharge Instructions: Thank you for choosing [facility] to provide your oncology and hematology care.  If you have a lab appointment with the Cancer Center, please come in thru the Main Entrance and check in at the main information desk.  Wear comfortable clothing and clothing appropriate for easy access to any Portacath or PICC line.   We strive to give you quality time with your provider. You may need to reschedule your appointment if you arrive late (15 or more minutes).  Arriving late affects you and other patients whose appointments are after yours.  Also, if you miss three or more appointments without notifying the office, you may be dismissed from the clinic at the provider's discretion.      For prescription refill requests, have your pharmacy contact our office and allow 72 hours for refills to be completed.        To help prevent nausea and vomiting after your treatment, we encourage you to take your nausea medication as directed.  BELOW ARE SYMPTOMS THAT SHOULD BE REPORTED IMMEDIATELY: *FEVER GREATER THAN 100.4 F (38 C) OR HIGHER *CHILLS OR SWEATING *NAUSEA AND VOMITING THAT IS NOT CONTROLLED WITH YOUR NAUSEA MEDICATION *UNUSUAL SHORTNESS OF BREATH *UNUSUAL BRUISING OR BLEEDING *URINARY PROBLEMS (pain or burning when urinating, or frequent urination) *BOWEL PROBLEMS (unusual diarrhea, constipation, pain near the anus) TENDERNESS IN MOUTH AND THROAT WITH OR WITHOUT PRESENCE OF ULCERS (sore throat, sores in mouth, or a toothache) UNUSUAL RASH, SWELLING OR PAIN  UNUSUAL VAGINAL DISCHARGE OR ITCHING   Items with * indicate a potential emergency and should be followed up as soon as possible or go to the Emergency Department if any problems should occur.  Please show the CHEMOTHERAPY ALERT CARD or IMMUNOTHERAPY ALERT CARD at check-in to the Emergency Department and triage nurse.  Should you have questions after your visit or need to cancel  or reschedule your appointment, please contact [facility] [phone]  and follow the prompts.  Office hours are 8:00 a.m. to 4:30 p.m. Monday - Friday. Please note that voicemails left after 4:00 p.m. may not be returned until the following business day.  We are closed weekends and major holidays. You have access to a nurse at all times for urgent questions. Please call the main number to the clinic [phone] and follow the prompts.  For any non-urgent questions, you may also contact your provider using MyChart. We now offer e-Visits for anyone 18 and older to request care online for non-urgent symptoms. For details visit [email or web address].   Also download the MyChart app! Go to the app store, search "MyChart", open the app, select [facility], and log in with your MyChart username and password.  Due to Covid, a mask is required upon entering the hospital/clinic. If you do not have a mask, one will be given to you upon arrival. For doctor visits, patients may have 1 support person aged 18 or older with them. For treatment visits, patients cannot have anyone with them due to current Covid guidelines and our immunocompromised population.  

## 2021-02-27 NOTE — Progress Notes (Signed)
Chemotherapy pump disconnected with no complaints voiced.  Patients port flushed without difficulty.  Good blood return noted with no bruising or swelling noted at site.  Band aid applied.  VSS with discharge and left in satisfactory condition with no s/s of distress noted.    

## 2021-03-10 NOTE — Progress Notes (Signed)
Susan Martin, [place] [id number]   CLINIC:  Medical Oncology/Hematology  PCP:  Patient, No Pcp Per (Inactive) None None   REASON FOR VISIT:  Follow-up for metastatic colon cancer to liver  PRIOR THERAPY: Sigmoid colectomy and end colostomy on 04/29/2020  NGS Results: Foundation 1 KRAS wildtype, MS--stable, TMB 4 Muts/Mb  CURRENT THERAPY: FOLFOX, Avastin & Aloxi every 2 weeks  BRIEF ONCOLOGIC HISTORY:  Oncology History  Metastatic colon cancer to liver ([facility])  05/16/2020 Initial Diagnosis   Metastatic colon cancer to liver ([facility])   06/03/2020 Genetic Testing   Foundation One:     06/04/2020 -  Chemotherapy    Patient is on Treatment Plan: COLORECTAL FOLFOX + BEVACIZUMAB Q14D         CANCER STAGING: Cancer Staging No matching staging information was found for the patient.  INTERVAL HISTORY:  Susan Martin, a 60 y.o. female, returns for routine follow-up and consideration for next cycle of chemotherapy. Susan Martin was last seen on 0.  Due for cycle #21 of FOLFOX, Avastin & Aloxi today.   Overall, she tells me she has been feeling pretty well. She is taking Gabapentin TID, and she is taking potassium once daily. She denies diarrhea and bleeding issues. She had mouth sores following her treatments which have since resolved. Her energy levels have improved, and she denies any new pains. She admits that she has not been drinking sufficient fluids daily.   Overall, she feels ready for next cycle of chemo today.   REVIEW OF SYSTEMS:  Review of Systems  Constitutional:  Negative for appetite change and fatigue (50%).  HENT:   Positive for mouth sores (resolved).   Neurological:  Positive for numbness (tingling in fingers).  Hematological:  Bruises/bleeds easily (bruising).  All other systems reviewed and are negative.  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 Delton Coombes   04-29-2020 [facility] surgery for perforated colon s/p sigmoid colectomy w/ colostomy; dx  Stage IV colon cancer mets to liver   Anxiety    Depression    Hypertension    followed by dr Glo Herring and oncology  (05-27-2020 per pt does not have pcp yet)   IDA (iron deficiency anemia)    Pulmonary nodule, right    Past Surgical History:  Procedure Laterality Date   ABDOMINAL HYSTERECTOMY  03-31-2016   @[facility]    W/  BILATERAL SALPINOOPHORECTOMY    APPLICATION OF WOUND VAC N/A 04/29/2020   Procedure: APPLICATION OF WOUND VAC;  Surgeon: Mickeal Skinner, MD;  Location: [facility];  Service: General;  Laterality: N/A;   ENDOMETRIAL ABLATION     LAPAROTOMY N/A 04/29/2020   Procedure: EXPLORATORY LAPAROTOMY;  Surgeon: Mickeal Skinner, MD;  Location: [facility];  Service: General;  Laterality: N/A;   PARTIAL COLECTOMY N/A 04/29/2020   Procedure: PARTIAL COLECTOMY WITH END COLOSTOMY;  Surgeon: Kieth Brightly Arta Bruce, MD;  Location: [facility];  Service: General;  Laterality: N/A;   PORTACATH PLACEMENT Right 05/28/2020   Procedure: INSERTION PORT-A-CATH;  Surgeon: Kinsinger, Arta Bruce, MD;  Location: [facility];  Service: General;  Laterality: Right;   SALPINGOOPHORECTOMY Left 03/31/2016   Procedure: LEFT SALPINGO OOPHORECTOMY WITH FROZEN SECTION,  ABDOMINAL HYSTERECTOMY WITH BILATERAL SALPINGO-OOPHORECTOMY;  Surgeon: Jonnie Kind, MD;  Location: [facility];  Service: Gynecology;  Laterality: Left;    SOCIAL HISTORY:  Social History   Socioeconomic History  Marital status: Divorced    Spouse name: Not on file   Number of children: Not on file   Years of education: Not on file   Highest education level: Not on file  Occupational History   Not on file  Tobacco Use   Smoking status: Former    Packs/day: 0.50    Years: 5.00    Pack years: 2.50    Types: Cigarettes    Quit date: 03/28/2015    Years since quitting: 5.9   Smokeless tobacco: Never   Vaping Use   Vaping Use: Never used  Substance and Sexual Activity   Alcohol use: No   Drug use: Never   Sexual activity: Yes    Partners: Male    Birth control/protection: Surgical  Other Topics Concern   Not on file  Social History Narrative   Not on file   Social Determinants of Health   Financial Resource Strain: Not on file  Food Insecurity: Not on file  Transportation Needs: Not on file  Physical Activity: Not on file  Stress: Not on file  Social Connections: Not on file  Intimate Partner Violence: Not on file    FAMILY HISTORY:  Family History  Problem Relation Age of Onset   Hypertension Mother    Diabetes Mother    Cirrhosis Father     CURRENT MEDICATIONS:  Current Outpatient Medications  Medication Sig Dispense Refill   amoxicillin (AMOXIL) 500 MG capsule Take 500 mg by mouth 3 (three) times daily.     BEVACIZUMAB IV Inject 5 mg/kg into the vein every 14 (fourteen) days.     fluorouracil CALGB 40768 in sodium chloride 0.9 % 150 mL Inject 2,400 mg/m2 into the vein over 48 hr.     gabapentin (NEURONTIN) 100 MG capsule TAKE 1 CAPSULE 3 TIMES DAILY. 90 capsule 0   gabapentin (NEURONTIN) 100 MG capsule Take 1 capsule (100 mg total) by mouth 3 (three) times daily. 90 capsule 3   hydrOXYzine (VISTARIL) 25 MG capsule Take 1 capsule (25 mg total) by mouth 3 (three) times daily as needed. Prn anxiety 60 capsule 2   LEUCOVORIN CALCIUM IV Inject 400 mg/m2 into the vein every 21 ( twenty-one) days.     lidocaine-prilocaine (EMLA) cream Apply small amount to port a cath site and cover with plastic wrap 1 hour prior to chemotherapy appointments 30 g 3   nystatin (MYCOSTATIN) 100000 UNIT/ML suspension Take by mouth.     PARoxetine (PAXIL) 40 MG tablet Take 1 tablet (40 mg total) by mouth every morning. 90 tablet 3   polyethylene glycol (MIRALAX / GLYCOLAX) packet Take 17 g by mouth every other day.     potassium chloride SA (KLOR-CON) 20 MEQ tablet Take 1 tablet (20 mEq  total) by mouth daily. 30 tablet 3   prochlorperazine (COMPAZINE) 10 MG tablet Take 1 tablet (10 mg total) by mouth every 6 (six) hours as needed (Nausea or vomiting). 30 tablet 1   triamterene-hydrochlorothiazide (MAXZIDE-25) 37.5-25 MG tablet TAKE 1 TABLET ONCE DAILY. 30 tablet 6   triamterene-hydrochlorothiazide (MAXZIDE-25) 37.5-25 MG tablet Take 1 tablet by mouth daily. 30 tablet 3   No current facility-administered medications for this visit.    ALLERGIES:  No Known Allergies  PHYSICAL EXAM:  Performance status (ECOG): 1 - Symptomatic but completely ambulatory  There were no vitals filed for this visit. Wt Readings from Last 3 Encounters:  02/25/21 170 lb 6.4 oz (77.3 kg)  02/12/21 165 lb (74.8 kg)  01/29/21 165  lb 3.2 oz (74.9 kg)   Physical Exam Vitals reviewed.  Constitutional:      Appearance: Normal appearance.  HENT:     Mouth/Throat:     Mouth: No oral lesions.  Cardiovascular:     Rate and Rhythm: Normal rate and regular rhythm.     Pulses: Normal pulses.     Heart sounds: Normal heart sounds.  Pulmonary:     Effort: Pulmonary effort is normal.     Breath sounds: Normal breath sounds.  Neurological:     General: No focal deficit present.     Mental Status: She is alert and oriented to person, place, and time.  Psychiatric:        Mood and Affect: Mood normal.        Behavior: Behavior normal.    LABORATORY DATA:  I have reviewed the labs as listed.  CBC Latest Ref Rng & Units 02/25/2021 02/12/2021 01/29/2021  WBC 4.0 - 10.5 K/uL 5.4 4.2 4.6  Hemoglobin 12.0 - 15.0 g/dL 13.4 13.6 13.2  Hematocrit 36.0 - 46.0 % 40.0 39.8 38.9  Platelets 150 - 400 K/uL 112(L) 117(L) 122(L)   CMP Latest Ref Rng & Units 02/25/2021 02/12/2021 01/29/2021  Glucose 70 - 99 mg/dL 106(H) 97 113(H)  BUN 6 - 20 mg/dL 13 12 15   Creatinine 0.44 - 1.00 mg/dL 0.90 0.83 0.84  Sodium 135 - 145 mmol/L 136 133(L) 134(L)  Potassium 3.5 - 5.1 mmol/L 3.7 3.6 3.5  Chloride 98 - 111 mmol/L 100 98  98  CO2 22 - 32 mmol/L 29 28 29   Calcium 8.9 - 10.3 mg/dL 8.7(L) 8.7(L) 8.8(L)  Total Protein 6.5 - 8.1 g/dL 7.3 7.6 7.4  Total Bilirubin 0.3 - 1.2 mg/dL 0.6 0.7 0.6  Alkaline Phos 38 - 126 U/L 96 90 104  AST 15 - 41 U/L 34 30 30  ALT 0 - 44 U/L 34 27 27    DIAGNOSTIC IMAGING:  I have independently reviewed the scans and discussed with the patient. No results found.   ASSESSMENT:  1.  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 -CT chest on 05/02/2020 with no evidence of pulmonary metastatic disease. -CTAP on 05/07/2020 with multiple lesions throughout the liver, largest in the right lobe measuring 3.9 cm, lateral segment left lobe measuring 2.6 cm and inferior right lobe confluent lesion 4.2 x 3.7 cm.  No lymphadenopathy. -CEA on 05/02/2020-60.2. -FOLFOX and bevacizumab started on 06/04/2020. 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   2.  Iron deficiency anemia: -Feraheme on 04/30/2020 and 05/06/2020.   3.  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 -Smoked for 3 to 4 years and quit. -Mother had cancer, type unknown.  Maternal uncle had lung cancer.  Maternal aunt had lung cancer.   PLAN:  1.  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 - CT CAP from 02/07/2021 showed multifocal liver lesions stable.  - Reviewed labs from today which shows normal LFTs and CBC.  Mild thrombocytopenia is stable at 139.  UA does not show any proteinuria.  CEA was 2.2. - She will continue treatment today and in 2 weeks.  RTC 4 weeks for follow-up with labs and treatment. - She has experienced mucositis during first  week.  She was told to use a solution of salt and baking soda to rinse her mouth every 2 hours during the daytime.   2.  Iron deficiency anemia: - Hemoglobin is 12.9.  No parenteral iron therapy is needed.   3.  Severe hypokalemia: - Continue potassium 20 mEq daily.  Potassium today slightly low at 3.3.   4.  Peripheral neuropathy: - Oxaliplatin was held since mid May. - She has tingling and numbness in the fingertips which is stable.  She is able to do cross stitching without any problems. - Continue gabapentin 100 mg 3 times daily which is helping.   5.  Anxiety: - Continue hydroxyzine 25 mg as needed.   Orders placed this encounter:  No orders of the defined types were placed in this encounter.    Derek Jack, MD [facility] [phone]   I, Thana Ates, am acting as a scribe for Dr. Derek Jack.  I, Derek Jack MD, have reviewed the above documentation for accuracy and completeness, and I agree with the above.

## 2021-03-11 ENCOUNTER — Inpatient Hospital Stay (HOSPITAL_COMMUNITY): Payer: Medicaid Other

## 2021-03-11 ENCOUNTER — Encounter (HOSPITAL_COMMUNITY): Payer: Self-pay | Admitting: Hematology

## 2021-03-11 ENCOUNTER — Other Ambulatory Visit: Payer: Self-pay

## 2021-03-11 ENCOUNTER — Inpatient Hospital Stay (HOSPITAL_BASED_OUTPATIENT_CLINIC_OR_DEPARTMENT_OTHER): Payer: Medicaid Other | Admitting: Hematology

## 2021-03-11 VITALS — BP 148/82 | HR 67 | Temp 96.7°F | Resp 18

## 2021-03-11 VITALS — BP 145/86 | HR 73 | Temp 96.8°F | Resp 17 | Wt 167.8 lb

## 2021-03-11 DIAGNOSIS — C189 Malignant neoplasm of colon, unspecified: Secondary | ICD-10-CM | POA: Diagnosis not present

## 2021-03-11 DIAGNOSIS — C787 Secondary malignant neoplasm of liver and intrahepatic bile duct: Secondary | ICD-10-CM

## 2021-03-11 DIAGNOSIS — Z5111 Encounter for antineoplastic chemotherapy: Secondary | ICD-10-CM | POA: Diagnosis not present

## 2021-03-11 LAB — URINALYSIS, DIPSTICK ONLY
Bilirubin Urine: NEGATIVE
Glucose, UA: NEGATIVE mg/dL
Hgb urine dipstick: NEGATIVE
Ketones, ur: NEGATIVE mg/dL
Leukocytes,Ua: NEGATIVE
Nitrite: NEGATIVE
Protein, ur: NEGATIVE mg/dL
Specific Gravity, Urine: 1.008 (ref 1.005–1.030)
pH: 8 (ref 5.0–8.0)

## 2021-03-11 LAB — COMPREHENSIVE METABOLIC PANEL
ALT: 21 U/L (ref 0–44)
AST: 24 U/L (ref 15–41)
Albumin: 3.6 g/dL (ref 3.5–5.0)
Alkaline Phosphatase: 96 U/L (ref 38–126)
Anion gap: 8 (ref 5–15)
BUN: 10 mg/dL (ref 6–20)
CO2: 28 mmol/L (ref 22–32)
Calcium: 8.7 mg/dL — ABNORMAL LOW (ref 8.9–10.3)
Chloride: 98 mmol/L (ref 98–111)
Creatinine, Ser: 0.85 mg/dL (ref 0.44–1.00)
GFR, Estimated: >60 mL/min (ref >60)
Glucose, Bld: 99 mg/dL (ref 70–99)
Potassium: 3.3 mmol/L — ABNORMAL LOW (ref 3.5–5.1)
Sodium: 134 mmol/L — ABNORMAL LOW (ref 135–145)
Total Bilirubin: 0.8 mg/dL (ref 0.3–1.2)
Total Protein: 7.5 g/dL (ref 6.5–8.1)

## 2021-03-11 LAB — CBC WITH DIFFERENTIAL/PLATELET
Abs Immature Granulocytes: 0.01 10*3/uL (ref 0.00–0.07)
Basophils Absolute: 0 10*3/uL (ref 0.0–0.1)
Basophils Relative: 1 %
Eosinophils Absolute: 0.2 10*3/uL (ref 0.0–0.5)
Eosinophils Relative: 3 %
HCT: 38 % (ref 36.0–46.0)
Hemoglobin: 12.9 g/dL (ref 12.0–15.0)
Immature Granulocytes: 0 %
Lymphocytes Relative: 30 %
Lymphs Abs: 1.8 10*3/uL (ref 0.7–4.0)
MCH: 31.9 pg (ref 26.0–34.0)
MCHC: 33.9 g/dL (ref 30.0–36.0)
MCV: 93.8 fL (ref 80.0–100.0)
Monocytes Absolute: 0.7 10*3/uL (ref 0.1–1.0)
Monocytes Relative: 11 %
Neutro Abs: 3.5 10*3/uL (ref 1.7–7.7)
Neutrophils Relative %: 55 %
Platelets: 139 10*3/uL — ABNORMAL LOW (ref 150–400)
RBC: 4.05 MIL/uL (ref 3.87–5.11)
RDW: 14.9 % (ref 11.5–15.5)
WBC: 6.2 10*3/uL (ref 4.0–10.5)
nRBC: 0 % (ref 0.0–0.2)

## 2021-03-11 LAB — MAGNESIUM: Magnesium: 2 mg/dL (ref 1.7–2.4)

## 2021-03-11 MED ORDER — SODIUM CHLORIDE 0.9% FLUSH: 10.0000 mL | INTRAVENOUS | Status: DC | PRN

## 2021-03-11 MED ORDER — FLUOROURACIL CHEMO INJECTION 2.5 GM/50ML
320.0000 mg/m2 | Freq: Once | INTRAVENOUS | Status: AC
  Administered 2021-03-11: 550 mg via INTRAVENOUS
  Filled 2021-03-11: qty 11

## 2021-03-11 MED ORDER — SODIUM CHLORIDE 0.9 % IV SOLN
5.0000 mg/kg | Freq: Once | INTRAVENOUS | Status: AC
  Administered 2021-03-11: 400 mg via INTRAVENOUS
  Filled 2021-03-11: qty 16

## 2021-03-11 MED ORDER — SODIUM CHLORIDE 0.9 % IV SOLN
400.0000 mg/m2 | Freq: Once | INTRAVENOUS | Status: DC
  Filled 2021-03-11: qty 34

## 2021-03-11 MED ORDER — SODIUM CHLORIDE 0.9 % IV SOLN
400.0000 mg/m2 | Freq: Once | INTRAVENOUS | Status: AC
  Administered 2021-03-11: 680 mg via INTRAVENOUS
  Filled 2021-03-11: qty 34

## 2021-03-11 MED ORDER — SODIUM CHLORIDE 0.9 % IV SOLN
1920.0000 mg/m2 | INTRAVENOUS | Status: DC
  Administered 2021-03-11: 3250 mg via INTRAVENOUS
  Filled 2021-03-11: qty 65

## 2021-03-11 MED ORDER — PALONOSETRON HCL INJECTION 0.25 MG/5ML
0.2500 mg | Freq: Once | INTRAVENOUS | Status: AC
  Administered 2021-03-11: 0.25 mg via INTRAVENOUS
  Filled 2021-03-11: qty 5

## 2021-03-11 MED ORDER — SODIUM CHLORIDE 0.9 % IV SOLN
10.0000 mg | Freq: Once | INTRAVENOUS | Status: AC
  Administered 2021-03-11: 10 mg via INTRAVENOUS
  Filled 2021-03-11: qty 10

## 2021-03-11 MED ORDER — SODIUM CHLORIDE 0.9 % IV SOLN: Freq: Once | INTRAVENOUS | Status: AC

## 2021-03-11 MED ORDER — HEPARIN SOD (PORK) LOCK FLUSH 100 UNIT/ML IV SOLN: 500.0000 [IU] | Freq: Once | INTRAVENOUS | Status: DC | PRN

## 2021-03-11 NOTE — Progress Notes (Signed)
Patient presents today for chemotherpy infusion.  Patient is in satisfactory condition with no new complaints.  Vital signs are stable.  Labs reviewed by Dr. Delton Coombes during her office visit.  We will proceed with treatment per MD orders.  Patient tolerated treatment well with no complaints voiced.  Patient left ambulatory in stable condition.  Vital signs stable at discharge.  Follow up as scheduled.     The chemotherapy medication bag should finish at 46 hours, 96 hours, or 7 days. For example, if your pump is scheduled for 46 hours and it was put on at 4:00 p.m., it should finish at 2:00 p.m. the day it is scheduled to come off regardless of your appointment time.     Estimated time to finish at 1015.   If the display on your pump reads "Low Volume" and it is beeping, take the batteries out of the pump and come to the cancer center for it to be taken off.   If the pump alarms go off prior to the pump reading "Low Volume" then call [phone] and someone can assist you.  If the plunger comes out and the chemotherapy medication is leaking out, please use your home chemo spill kit to clean up the spill. Do NOT use paper towels or other household products.  If you have problems or questions regarding your pump, please call either [phone] (24 hours a day) or the cancer center Monday-Friday 8:00 a.m.- 4:30 p.m. at the clinic number and we will assist you. If you are unable to get assistance, then go to the nearest Emergency Department and ask the staff to contact the IV team for assistance.

## 2021-03-11 NOTE — Progress Notes (Signed)
VO via [facility] to remove dextrose fluid from careplan with Dc of oxaliplatin

## 2021-03-11 NOTE — Progress Notes (Signed)
Patient has been examined, vital signs and labs have been reviewed by Dr. Katragadda. ANC, Creatinine, LFTs, hemoglobin, and platelets are within treatment parameters per Dr. Katragadda. Patient is okay to proceed with treatment per M.D.   

## 2021-03-11 NOTE — Progress Notes (Signed)
Port flushed with good blood return noted. No bruising or swelling at site. Patient awaiting lab results for treatment.

## 2021-03-11 NOTE — Patient Instructions (Signed)
[facility]  Discharge Instructions: Thank you for choosing [facility] to provide your oncology and hematology care.  If you have a lab appointment with the Cancer Center, please come in thru the Main Entrance and check in at the main information desk.  Wear comfortable clothing and clothing appropriate for easy access to any Portacath or PICC line.   We strive to give you quality time with your provider. You may need to reschedule your appointment if you arrive late (15 or more minutes).  Arriving late affects you and other patients whose appointments are after yours.  Also, if you miss three or more appointments without notifying the office, you may be dismissed from the clinic at the provider's discretion.      For prescription refill requests, have your pharmacy contact our office and allow 72 hours for refills to be completed.        To help prevent nausea and vomiting after your treatment, we encourage you to take your nausea medication as directed.  BELOW ARE SYMPTOMS THAT SHOULD BE REPORTED IMMEDIATELY: *FEVER GREATER THAN 100.4 F (38 C) OR HIGHER *CHILLS OR SWEATING *NAUSEA AND VOMITING THAT IS NOT CONTROLLED WITH YOUR NAUSEA MEDICATION *UNUSUAL SHORTNESS OF BREATH *UNUSUAL BRUISING OR BLEEDING *URINARY PROBLEMS (pain or burning when urinating, or frequent urination) *BOWEL PROBLEMS (unusual diarrhea, constipation, pain near the anus) TENDERNESS IN MOUTH AND THROAT WITH OR WITHOUT PRESENCE OF ULCERS (sore throat, sores in mouth, or a toothache) UNUSUAL RASH, SWELLING OR PAIN  UNUSUAL VAGINAL DISCHARGE OR ITCHING   Items with * indicate a potential emergency and should be followed up as soon as possible or go to the Emergency Department if any problems should occur.  Please show the CHEMOTHERAPY ALERT CARD or IMMUNOTHERAPY ALERT CARD at check-in to the Emergency Department and triage nurse.  Should you have questions after your visit or need to cancel  or reschedule your appointment, please contact [facility] [phone]  and follow the prompts.  Office hours are 8:00 a.m. to 4:30 p.m. Monday - Friday. Please note that voicemails left after 4:00 p.m. may not be returned until the following business day.  We are closed weekends and major holidays. You have access to a nurse at all times for urgent questions. Please call the main number to the clinic [phone] and follow the prompts.  For any non-urgent questions, you may also contact your provider using MyChart. We now offer e-Visits for anyone 18 and older to request care online for non-urgent symptoms. For details visit [email or web address].   Also download the MyChart app! Go to the app store, search "MyChart", open the app, select , and log in with your MyChart username and password.  Due to Covid, a mask is required upon entering the hospital/clinic. If you do not have a mask, one will be given to you upon arrival. For doctor visits, patients may have 1 support person aged 18 or older with them. For treatment visits, patients cannot have anyone with them due to current Covid guidelines and our immunocompromised population.  

## 2021-03-11 NOTE — Patient Instructions (Addendum)
[facility] at [facility] Discharge Instructions  You were seen today by Dr. Katragadda. He went over your recent results, and you received your treatment. Dr. Katragadda will see you back in 1 month for labs and follow up.   Thank you for choosing [facility] at [facility] to provide your oncology and hematology care.  To afford each patient quality time with our provider, please arrive at least 15 minutes before your scheduled appointment time.   If you have a lab appointment with the Cancer Center please come in thru the Main Entrance and check in at the main information desk  You need to re-schedule your appointment should you arrive 10 or more minutes late.  We strive to give you quality time with our providers, and arriving late affects you and other patients whose appointments are after yours.  Also, if you no show three or more times for appointments you may be dismissed from the clinic at the providers discretion.     Again, thank you for choosing [facility].  Our hope is that these requests will decrease the amount of time that you wait before being seen by our physicians.       _____________________________________________________________  Should you have questions after your visit to [facility], please contact our office at [phone] between the hours of 8:00 a.m. and 4:30 p.m.  Voicemails left after 4:00 p.m. will not be returned until the following business day.  For prescription refill requests, have your pharmacy contact our office and allow 72 hours.    Cancer Center Support Programs:   > Cancer Support Group  2nd Tuesday of the month 1pm-2pm, Journey Room   

## 2021-03-12 LAB — CEA: CEA: 2.1 ng/mL (ref 0.0–4.7)

## 2021-03-13 ENCOUNTER — Inpatient Hospital Stay (HOSPITAL_COMMUNITY): Payer: Medicaid Other

## 2021-03-13 ENCOUNTER — Other Ambulatory Visit: Payer: Self-pay

## 2021-03-13 VITALS — BP 147/81 | HR 68 | Temp 97.2°F | Resp 18

## 2021-03-13 DIAGNOSIS — Z5111 Encounter for antineoplastic chemotherapy: Secondary | ICD-10-CM | POA: Diagnosis not present

## 2021-03-13 DIAGNOSIS — C189 Malignant neoplasm of colon, unspecified: Secondary | ICD-10-CM

## 2021-03-13 DIAGNOSIS — C787 Secondary malignant neoplasm of liver and intrahepatic bile duct: Secondary | ICD-10-CM

## 2021-03-13 MED ORDER — HEPARIN SOD (PORK) LOCK FLUSH 100 UNIT/ML IV SOLN
500.0000 [IU] | Freq: Once | INTRAVENOUS | Status: AC | PRN
  Administered 2021-03-13: 500 [IU]

## 2021-03-13 MED ORDER — SODIUM CHLORIDE 0.9% FLUSH
10.0000 mL | INTRAVENOUS | Status: DC | PRN
  Administered 2021-03-13: 10 mL

## 2021-03-13 NOTE — Patient Instructions (Signed)
[facility]  Discharge Instructions: Thank you for choosing [facility] to provide your oncology and hematology care.  If you have a lab appointment with the [facility], please come in thru the Main Entrance and check in at the main information desk.  Wear comfortable clothing and clothing appropriate for easy access to any Portacath or PICC line.   We strive to give you quality time with your provider. You may need to reschedule your appointment if you arrive late (15 or more minutes).  Arriving late affects you and other patients whose appointments are after yours.  Also, if you miss three or more appointments without notifying the office, you may be dismissed from the clinic at the provider's discretion.      For prescription refill requests, have your pharmacy contact our office and allow 72 hours for refills to be completed.    Today you received the following chemotherapy and/or immunotherapy agents Pump disconnect      To help prevent nausea and vomiting after your treatment, we encourage you to take your nausea medication as directed.  BELOW ARE SYMPTOMS THAT SHOULD BE REPORTED IMMEDIATELY: *FEVER GREATER THAN 100.4 F (38 C) OR HIGHER *CHILLS OR SWEATING *NAUSEA AND VOMITING THAT IS NOT CONTROLLED WITH YOUR NAUSEA MEDICATION *UNUSUAL SHORTNESS OF BREATH *UNUSUAL BRUISING OR BLEEDING *URINARY PROBLEMS (pain or burning when urinating, or frequent urination) *BOWEL PROBLEMS (unusual diarrhea, constipation, pain near the anus) TENDERNESS IN MOUTH AND THROAT WITH OR WITHOUT PRESENCE OF ULCERS (sore throat, sores in mouth, or a toothache) UNUSUAL RASH, SWELLING OR PAIN  UNUSUAL VAGINAL DISCHARGE OR ITCHING   Items with * indicate a potential emergency and should be followed up as soon as possible or go to the Emergency Department if any problems should occur.  Please show the CHEMOTHERAPY ALERT CARD or IMMUNOTHERAPY ALERT CARD at check-in to the Emergency  Department and triage nurse.  Should you have questions after your visit or need to cancel or reschedule your appointment, please contact [facility] [phone]  and follow the prompts.  Office hours are 8:00 a.m. to 4:30 p.m. Monday - Friday. Please note that voicemails left after 4:00 p.m. may not be returned until the following business day.  We are closed weekends and major holidays. You have access to a nurse at all times for urgent questions. Please call the main number to the clinic [phone] and follow the prompts.  For any non-urgent questions, you may also contact your provider using MyChart. We now offer e-Visits for anyone 42 and older to request care online for non-urgent symptoms. For details visit [email or web address].   Also download the MyChart app! Go to the app store, search "MyChart", open the app, select [facility], and log in with your MyChart username and password.  Due to Covid, a mask is required upon entering the hospital/clinic. If you do not have a mask, one will be given to you upon arrival. For doctor visits, patients may have 1 support person aged 61 or older with them. For treatment visits, patients cannot have anyone with them due to current Covid guidelines and our immunocompromised population.

## 2021-03-13 NOTE — Progress Notes (Signed)
Patient presents today for 5FU pump stop and disconnection after 46 hour continous infusion.   5FU pump deaccessed.  Patients port flushed without difficulty.  Good blood return noted with no bruising or swelling noted at site.  needle removed intact.  Band aid applied.  VSS with discharge and left in satisfactory condition ambulatory with no s/s of distress noted.

## 2021-03-25 ENCOUNTER — Inpatient Hospital Stay (HOSPITAL_COMMUNITY): Payer: Medicaid Other

## 2021-03-25 ENCOUNTER — Other Ambulatory Visit: Payer: Self-pay

## 2021-03-25 VITALS — BP 157/80 | HR 73 | Temp 97.4°F | Resp 18

## 2021-03-25 DIAGNOSIS — Z5111 Encounter for antineoplastic chemotherapy: Secondary | ICD-10-CM | POA: Diagnosis not present

## 2021-03-25 DIAGNOSIS — C189 Malignant neoplasm of colon, unspecified: Secondary | ICD-10-CM

## 2021-03-25 DIAGNOSIS — C787 Secondary malignant neoplasm of liver and intrahepatic bile duct: Secondary | ICD-10-CM

## 2021-03-25 LAB — COMPREHENSIVE METABOLIC PANEL
ALT: 22 U/L (ref 0–44)
AST: 27 U/L (ref 15–41)
Albumin: 3.5 g/dL (ref 3.5–5.0)
Alkaline Phosphatase: 95 U/L (ref 38–126)
Anion gap: 9 (ref 5–15)
BUN: 14 mg/dL (ref 6–20)
CO2: 26 mmol/L (ref 22–32)
Calcium: 8.6 mg/dL — ABNORMAL LOW (ref 8.9–10.3)
Chloride: 99 mmol/L (ref 98–111)
Creatinine, Ser: 0.87 mg/dL (ref 0.44–1.00)
GFR, Estimated: >60 mL/min (ref >60)
Glucose, Bld: 144 mg/dL — ABNORMAL HIGH (ref 70–99)
Potassium: 3.3 mmol/L — ABNORMAL LOW (ref 3.5–5.1)
Sodium: 134 mmol/L — ABNORMAL LOW (ref 135–145)
Total Bilirubin: 0.7 mg/dL (ref 0.3–1.2)
Total Protein: 7.5 g/dL (ref 6.5–8.1)

## 2021-03-25 LAB — CBC WITH DIFFERENTIAL/PLATELET
Abs Immature Granulocytes: 0.02 10*3/uL (ref 0.00–0.07)
Basophils Absolute: 0.1 10*3/uL (ref 0.0–0.1)
Basophils Relative: 1 %
Eosinophils Absolute: 0.1 10*3/uL (ref 0.0–0.5)
Eosinophils Relative: 2 %
HCT: 39.9 % (ref 36.0–46.0)
Hemoglobin: 13.5 g/dL (ref 12.0–15.0)
Immature Granulocytes: 0 %
Lymphocytes Relative: 26 %
Lymphs Abs: 1.4 10*3/uL (ref 0.7–4.0)
MCH: 31.5 pg (ref 26.0–34.0)
MCHC: 33.8 g/dL (ref 30.0–36.0)
MCV: 93 fL (ref 80.0–100.0)
Monocytes Absolute: 0.5 10*3/uL (ref 0.1–1.0)
Monocytes Relative: 8 %
Neutro Abs: 3.4 10*3/uL (ref 1.7–7.7)
Neutrophils Relative %: 63 %
Platelets: 115 10*3/uL — ABNORMAL LOW (ref 150–400)
RBC: 4.29 MIL/uL (ref 3.87–5.11)
RDW: 15.5 % (ref 11.5–15.5)
WBC: 5.5 10*3/uL (ref 4.0–10.5)
nRBC: 0 % (ref 0.0–0.2)

## 2021-03-25 LAB — MAGNESIUM: Magnesium: 2 mg/dL (ref 1.7–2.4)

## 2021-03-25 MED ORDER — POTASSIUM CHLORIDE CRYS ER 20 MEQ PO TBCR
40.0000 meq | EXTENDED_RELEASE_TABLET | Freq: Once | ORAL | Status: AC
  Administered 2021-03-25: 40 meq via ORAL
  Filled 2021-03-25: qty 2

## 2021-03-25 MED ORDER — PALONOSETRON HCL INJECTION 0.25 MG/5ML
0.2500 mg | Freq: Once | INTRAVENOUS | Status: AC
  Administered 2021-03-25: 0.25 mg via INTRAVENOUS
  Filled 2021-03-25: qty 5

## 2021-03-25 MED ORDER — SODIUM CHLORIDE 0.9 % IV SOLN
400.0000 mg/m2 | Freq: Once | INTRAVENOUS | Status: AC
  Administered 2021-03-25: 680 mg via INTRAVENOUS
  Filled 2021-03-25: qty 34

## 2021-03-25 MED ORDER — SODIUM CHLORIDE 0.9 % IV SOLN
1920.0000 mg/m2 | INTRAVENOUS | Status: DC
  Administered 2021-03-25: 3250 mg via INTRAVENOUS
  Filled 2021-03-25: qty 65

## 2021-03-25 MED ORDER — SODIUM CHLORIDE 0.9 % IV SOLN: Freq: Once | INTRAVENOUS | Status: AC

## 2021-03-25 MED ORDER — SODIUM CHLORIDE 0.9 % IV SOLN
10.0000 mg | Freq: Once | INTRAVENOUS | Status: AC
  Administered 2021-03-25: 10 mg via INTRAVENOUS
  Filled 2021-03-25: qty 10

## 2021-03-25 MED ORDER — SODIUM CHLORIDE 0.9 % IV SOLN
5.0000 mg/kg | Freq: Once | INTRAVENOUS | Status: AC
  Administered 2021-03-25: 400 mg via INTRAVENOUS
  Filled 2021-03-25: qty 16

## 2021-03-25 MED ORDER — POTASSIUM CHLORIDE CRYS ER 20 MEQ PO TBCR
40.0000 meq | EXTENDED_RELEASE_TABLET | Freq: Once | ORAL | Status: DC
Start: 2021-03-25 — End: 2021-03-25

## 2021-03-25 MED ORDER — FLUOROURACIL CHEMO INJECTION 2.5 GM/50ML
320.0000 mg/m2 | Freq: Once | INTRAVENOUS | Status: AC
  Administered 2021-03-25: 550 mg via INTRAVENOUS
  Filled 2021-03-25: qty 11

## 2021-03-25 NOTE — Progress Notes (Signed)
Pt presents Avastin/Leuc/5FU per provider's order. Vital signs stable for treatment. Potassium was 3.3 per Dr.K's standing order 43mEq ordered. Dr. Raliegh Ip made aware. Okay for treatment today.  Avsatin/Leuc/5FU given today per MD orders. Tolerated infusion without adverse affects. Vital signs stable. No complaints at this time. Discharged from clinic ambulatory in stable condition. Alert and oriented x 3. F/U with [facility] as scheduled. 5FU ambulatory pump infusing.

## 2021-03-25 NOTE — Progress Notes (Addendum)
Patients port flushed without difficulty.  Good blood return noted with no bruising or swelling noted at site.  Stable during access and blood draw.  Patient to remain accessed for treatment. 

## 2021-03-25 NOTE — Patient Instructions (Signed)
[facility]  Discharge Instructions: Thank you for choosing [facility] to provide your oncology and hematology care.  If you have a lab appointment with the [facility], please come in thru the Main Entrance and check in at the main information desk.  Wear comfortable clothing and clothing appropriate for easy access to any Portacath or PICC line.   We strive to give you quality time with your provider. You may need to reschedule your appointment if you arrive late (15 or more minutes).  Arriving late affects you and other patients whose appointments are after yours.  Also, if you miss three or more appointments without notifying the office, you may be dismissed from the clinic at the provider's discretion.      For prescription refill requests, have your pharmacy contact our office and allow 72 hours for refills to be completed.    Today you received the following chemotherapy and/or immunotherapy agents Avastin/Leuc/5FU.   To help prevent nausea and vomiting after your treatment, we encourage you to take your nausea medication as directed.  BELOW ARE SYMPTOMS THAT SHOULD BE REPORTED IMMEDIATELY: *FEVER GREATER THAN 100.4 F (38 C) OR HIGHER *CHILLS OR SWEATING *NAUSEA AND VOMITING THAT IS NOT CONTROLLED WITH YOUR NAUSEA MEDICATION *UNUSUAL SHORTNESS OF BREATH *UNUSUAL BRUISING OR BLEEDING *URINARY PROBLEMS (pain or burning when urinating, or frequent urination) *BOWEL PROBLEMS (unusual diarrhea, constipation, pain near the anus) TENDERNESS IN MOUTH AND THROAT WITH OR WITHOUT PRESENCE OF ULCERS (sore throat, sores in mouth, or a toothache) UNUSUAL RASH, SWELLING OR PAIN  UNUSUAL VAGINAL DISCHARGE OR ITCHING   Items with * indicate a potential emergency and should be followed up as soon as possible or go to the Emergency Department if any problems should occur.  Please show the CHEMOTHERAPY ALERT CARD or IMMUNOTHERAPY ALERT CARD at check-in to the Emergency  Department and triage nurse.  Should you have questions after your visit or need to cancel or reschedule your appointment, please contact [facility] [phone]  and follow the prompts.  Office hours are 8:00 a.m. to 4:30 p.m. Monday - Friday. Please note that voicemails left after 4:00 p.m. may not be returned until the following business day.  We are closed weekends and major holidays. You have access to a nurse at all times for urgent questions. Please call the main number to the clinic [phone] and follow the prompts.  For any non-urgent questions, you may also contact your provider using MyChart. We now offer e-Visits for anyone 56 and older to request care online for non-urgent symptoms. For details visit [email or web address].   Also download the MyChart app! Go to the app store, search "MyChart", open the app, select [facility], and log in with your MyChart username and password.  Due to Covid, a mask is required upon entering the hospital/clinic. If you do not have a mask, one will be given to you upon arrival. For doctor visits, patients may have 1 support person aged 45 or older with them. For treatment visits, patients cannot have anyone with them due to current Covid guidelines and our immunocompromised population. [facility]  Discharge Instructions: Thank you for choosing [facility] to provide your oncology and hematology care.  If you have a lab appointment with the [facility], please come in thru the Main Entrance and check in at the main information desk.  Wear comfortable clothing and clothing appropriate for easy access to any Portacath or PICC  line.   We strive to give you quality time with your provider. You may need to reschedule your appointment if you arrive late (15 or more minutes).  Arriving late affects you and other patients whose appointments are after yours.  Also, if you miss three or more appointments without  notifying the office, you may be dismissed from the clinic at the provider's discretion.      For prescription refill requests, have your pharmacy contact our office and allow 72 hours for refills to be completed.    Today you received the following chemotherapy and/or immunotherapy agents Avastin/Leuc/5FU.   To help prevent nausea and vomiting after your treatment, we encourage you to take your nausea medication as directed.  BELOW ARE SYMPTOMS THAT SHOULD BE REPORTED IMMEDIATELY: *FEVER GREATER THAN 100.4 F (38 C) OR HIGHER *CHILLS OR SWEATING *NAUSEA AND VOMITING THAT IS NOT CONTROLLED WITH YOUR NAUSEA MEDICATION *UNUSUAL SHORTNESS OF BREATH *UNUSUAL BRUISING OR BLEEDING *URINARY PROBLEMS (pain or burning when urinating, or frequent urination) *BOWEL PROBLEMS (unusual diarrhea, constipation, pain near the anus) TENDERNESS IN MOUTH AND THROAT WITH OR WITHOUT PRESENCE OF ULCERS (sore throat, sores in mouth, or a toothache) UNUSUAL RASH, SWELLING OR PAIN  UNUSUAL VAGINAL DISCHARGE OR ITCHING   Items with * indicate a potential emergency and should be followed up as soon as possible or go to the Emergency Department if any problems should occur.  Please show the CHEMOTHERAPY ALERT CARD or IMMUNOTHERAPY ALERT CARD at check-in to the Emergency Department and triage nurse.  Should you have questions after your visit or need to cancel or reschedule your appointment, please contact [facility] [phone]  and follow the prompts.  Office hours are 8:00 a.m. to 4:30 p.m. Monday - Friday. Please note that voicemails left after 4:00 p.m. may not be returned until the following business day.  We are closed weekends and major holidays. You have access to a nurse at all times for urgent questions. Please call the main number to the clinic [phone] and follow the prompts.  For any non-urgent questions, you may also contact your provider using MyChart. We now offer e-Visits for anyone  50 and older to request care online for non-urgent symptoms. For details visit [email or web address].   Also download the MyChart app! Go to the app store, search "MyChart", open the app, select [facility], and log in with your MyChart username and password.  Due to Covid, a mask is required upon entering the hospital/clinic. If you do not have a mask, one will be given to you upon arrival. For doctor visits, patients may have 1 support person aged 31 or older with them. For treatment visits, patients cannot have anyone with them due to current Covid guidelines and our immunocompromised population. [facility]  Discharge Instructions: Thank you for choosing [facility] to provide your oncology and hematology care.  If you have a lab appointment with the [facility], please come in thru the Main Entrance and check in at the main information desk.  Wear comfortable clothing and clothing appropriate for easy access to any Portacath or PICC line.   We strive to give you quality time with your provider. You may need to reschedule your appointment if you arrive late (15 or more minutes).  Arriving late affects you and other patients whose appointments are after yours.  Also, if you miss three or more appointments without notifying the office, you may be dismissed from the clinic at  the provider's discretion.      For prescription refill requests, have your pharmacy contact our office and allow 72 hours for refills to be completed.    Today you received the following chemotherapy and/or immunotherapy agents Avastin/Leuc/5FU.  The chemotherapy medication bag should finish at 46 hours, 96 hours, or 7 days. For example, if your pump is scheduled for 46 hours and it was put on at 4:00 p.m., it should finish at 2:00 p.m. the day it is scheduled to come off regardless of your appointment time.     Estimated time to finish at 10.    If the display on your pump reads "Low Volume"  and it is beeping, take the batteries out of the pump and come to the cancer center for it to be taken off.   If the pump alarms go off prior to the pump reading "Low Volume" then call [phone] and someone can assist you.  If the plunger comes out and the chemotherapy medication is leaking out, please use your home chemo spill kit to clean up the spill. Do NOT use paper towels or other household products.  If you have problems or questions regarding your pump, please call either [phone] (24 hours a day) or the cancer center Monday-Friday 8:00 a.m.- 4:30 p.m. at the clinic number and we will assist you. If you are unable to get assistance, then go to the nearest Emergency Department and ask the staff to contact the IV team for assistance.      To help prevent nausea and vomiting after your treatment, we encourage you to take your nausea medication as directed.  BELOW ARE SYMPTOMS THAT SHOULD BE REPORTED IMMEDIATELY: *FEVER GREATER THAN 100.4 F (38 C) OR HIGHER *CHILLS OR SWEATING *NAUSEA AND VOMITING THAT IS NOT CONTROLLED WITH YOUR NAUSEA MEDICATION *UNUSUAL SHORTNESS OF BREATH *UNUSUAL BRUISING OR BLEEDING *URINARY PROBLEMS (pain or burning when urinating, or frequent urination) *BOWEL PROBLEMS (unusual diarrhea, constipation, pain near the anus) TENDERNESS IN MOUTH AND THROAT WITH OR WITHOUT PRESENCE OF ULCERS (sore throat, sores in mouth, or a toothache) UNUSUAL RASH, SWELLING OR PAIN  UNUSUAL VAGINAL DISCHARGE OR ITCHING   Items with * indicate a potential emergency and should be followed up as soon as possible or go to the Emergency Department if any problems should occur.  Please show the CHEMOTHERAPY ALERT CARD or IMMUNOTHERAPY ALERT CARD at check-in to the Emergency Department and triage nurse.  Should you have questions after your visit or need to cancel or reschedule your appointment, please contact [facility] [phone]  and follow the prompts.   Office hours are 8:00 a.m. to 4:30 p.m. Monday - Friday. Please note that voicemails left after 4:00 p.m. may not be returned until the following business day.  We are closed weekends and major holidays. You have access to a nurse at all times for urgent questions. Please call the main number to the clinic [phone] and follow the prompts.  For any non-urgent questions, you may also contact your provider using MyChart. We now offer e-Visits for anyone 29 and older to request care online for non-urgent symptoms. For details visit [email or web address].   Also download the MyChart app! Go to the app store, search "MyChart", open the app, select [facility], and log in with your MyChart username and password.  Due to Covid, a mask is required upon entering the hospital/clinic. If you do not have a mask, one will be given to you upon arrival. For doctor  visits, patients may have 1 support person aged 27 or older with them. For treatment visits, patients cannot have anyone with them due to current Covid guidelines and our immunocompromised population.

## 2021-03-27 ENCOUNTER — Other Ambulatory Visit: Payer: Self-pay

## 2021-03-27 ENCOUNTER — Inpatient Hospital Stay (HOSPITAL_COMMUNITY): Payer: Medicaid Other

## 2021-03-27 VITALS — BP 150/82 | HR 65 | Temp 97.8°F | Resp 16

## 2021-03-27 DIAGNOSIS — C189 Malignant neoplasm of colon, unspecified: Secondary | ICD-10-CM

## 2021-03-27 DIAGNOSIS — Z5111 Encounter for antineoplastic chemotherapy: Secondary | ICD-10-CM | POA: Diagnosis not present

## 2021-03-27 DIAGNOSIS — C787 Secondary malignant neoplasm of liver and intrahepatic bile duct: Secondary | ICD-10-CM

## 2021-03-27 MED ORDER — SODIUM CHLORIDE 0.9% FLUSH
10.0000 mL | INTRAVENOUS | Status: DC | PRN
  Administered 2021-03-27: 10 mL

## 2021-03-27 MED ORDER — HEPARIN SOD (PORK) LOCK FLUSH 100 UNIT/ML IV SOLN
500.0000 [IU] | Freq: Once | INTRAVENOUS | Status: AC | PRN
  Administered 2021-03-27: 500 [IU]

## 2021-03-27 NOTE — Progress Notes (Signed)
Susan Martin presents to have home infusion pump d/c'd and for port-a-cath deaccess with flush.  Portacath located R chest wall accessed with  H 20 needle.  Good blood return present. Portacath flushed with NS and 500U/71ml Heparin, and needle removed intact.  Procedure tolerated well and without incident.  Patient left ambulatory in stable condition.

## 2021-03-27 NOTE — Patient Instructions (Signed)
[facility]  Discharge Instructions: Thank you for choosing [facility] to provide your oncology and hematology care.  If you have a lab appointment with the Cancer Center, please come in thru the Main Entrance and check in at the main information desk.  Wear comfortable clothing and clothing appropriate for easy access to any Portacath or PICC line.   We strive to give you quality time with your provider. You may need to reschedule your appointment if you arrive late (15 or more minutes).  Arriving late affects you and other patients whose appointments are after yours.  Also, if you miss three or more appointments without notifying the office, you may be dismissed from the clinic at the provider's discretion.      For prescription refill requests, have your pharmacy contact our office and allow 72 hours for refills to be completed.        To help prevent nausea and vomiting after your treatment, we encourage you to take your nausea medication as directed.  BELOW ARE SYMPTOMS THAT SHOULD BE REPORTED IMMEDIATELY: *FEVER GREATER THAN 100.4 F (38 C) OR HIGHER *CHILLS OR SWEATING *NAUSEA AND VOMITING THAT IS NOT CONTROLLED WITH YOUR NAUSEA MEDICATION *UNUSUAL SHORTNESS OF BREATH *UNUSUAL BRUISING OR BLEEDING *URINARY PROBLEMS (pain or burning when urinating, or frequent urination) *BOWEL PROBLEMS (unusual diarrhea, constipation, pain near the anus) TENDERNESS IN MOUTH AND THROAT WITH OR WITHOUT PRESENCE OF ULCERS (sore throat, sores in mouth, or a toothache) UNUSUAL RASH, SWELLING OR PAIN  UNUSUAL VAGINAL DISCHARGE OR ITCHING   Items with * indicate a potential emergency and should be followed up as soon as possible or go to the Emergency Department if any problems should occur.  Please show the CHEMOTHERAPY ALERT CARD or IMMUNOTHERAPY ALERT CARD at check-in to the Emergency Department and triage nurse.  Should you have questions after your visit or need to cancel  or reschedule your appointment, please contact [facility] [phone]  and follow the prompts.  Office hours are 8:00 a.m. to 4:30 p.m. Monday - Friday. Please note that voicemails left after 4:00 p.m. may not be returned until the following business day.  We are closed weekends and major holidays. You have access to a nurse at all times for urgent questions. Please call the main number to the clinic [phone] and follow the prompts.  For any non-urgent questions, you may also contact your provider using MyChart. We now offer e-Visits for anyone 18 and older to request care online for non-urgent symptoms. For details visit [email or web address].   Also download the MyChart app! Go to the app store, search "MyChart", open the app, select [facility], and log in with your MyChart username and password.  Due to Covid, a mask is required upon entering the hospital/clinic. If you do not have a mask, one will be given to you upon arrival. For doctor visits, patients may have 1 support person aged 18 or older with them. For treatment visits, patients cannot have anyone with them due to current Covid guidelines and our immunocompromised population.  

## 2021-04-07 NOTE — Progress Notes (Signed)
[place], [place] [address]   CLINIC:  Medical Oncology/Hematology  PCP:  Patient, No Pcp Per (Inactive) None None   REASON FOR VISIT:  Follow-up for metastatic colon cancer to liver  PRIOR THERAPY: Sigmoid colectomy and end colostomy on 04/29/2020  NGS Results: Foundation 1 KRAS wildtype, MS--stable, TMB 4 Muts/Mb  CURRENT THERAPY: FOLFOX, Avastin & Aloxi every 2 weeks  BRIEF ONCOLOGIC HISTORY:  Oncology History  Metastatic colon cancer to liver ([place])  05/16/2020 Initial Diagnosis   Metastatic colon cancer to liver ([place])   06/03/2020 Genetic Testing   Foundation One:     06/04/2020 -  Chemotherapy   Patient is on Treatment Plan : COLORECTAL FOLFOX + Bevacizumab q14d       CANCER STAGING: Cancer Staging No matching staging information was found for the patient.  INTERVAL HISTORY:  Ms. Nanetta Wiegman, a 60 y.o. female, returns for routine follow-up and consideration for next cycle of chemotherapy. Early was last seen on 03/11/2021.  Due for cycle #23 of FOLFOX + Bevacizumab today.   Overall, she tells me she has been feeling pretty well.  She denies any current bleeding. She reports mouth sores for the first 2-3 days following treatment. She takes Gabapentin TID. She denies nausea and vomiting, and she empties her colostomy bad 1-2 times daily.   Overall, she feels ready for next cycle of chemo today.   REVIEW OF SYSTEMS:  Review of Systems  Constitutional:  Negative for appetite change, fatigue (60%) and unexpected weight change.  HENT:   Positive for mouth sores (resolved). Negative for nosebleeds.   Respiratory:  Negative for hemoptysis.   Gastrointestinal:  Negative for blood in stool, nausea and vomiting.  Genitourinary:  Negative for hematuria and vaginal bleeding.   Neurological:  Positive for numbness (left hand and foot).  Hematological:  Does not bruise/bleed easily.  All other systems reviewed and are  negative.  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 Delton Coombes   04-29-2020 [facility] surgery for perforated colon s/p sigmoid colectomy w/ colostomy; dx  Stage IV colon cancer mets to liver   Anxiety    Depression    Hypertension    followed by dr Glo Herring and oncology  (05-27-2020 per pt does not have pcp yet)   IDA (iron deficiency anemia)    Pulmonary nodule, right    Past Surgical History:  Procedure Laterality Date   ABDOMINAL HYSTERECTOMY  03-31-2016   @[facility]    W/  BILATERAL SALPINOOPHORECTOMY    APPLICATION OF WOUND VAC N/A 04/29/2020   Procedure: APPLICATION OF WOUND VAC;  Surgeon: Mickeal Skinner, MD;  Location: [facility];  Service: General;  Laterality: N/A;   ENDOMETRIAL ABLATION     LAPAROTOMY N/A 04/29/2020   Procedure: EXPLORATORY LAPAROTOMY;  Surgeon: Mickeal Skinner, MD;  Location: [facility];  Service: General;  Laterality: N/A;   PARTIAL COLECTOMY N/A 04/29/2020   Procedure: PARTIAL COLECTOMY WITH END COLOSTOMY;  Surgeon: Kieth Brightly Arta Bruce, MD;  Location: [facility];  Service: General;  Laterality: N/A;   PORTACATH PLACEMENT Right 05/28/2020   Procedure: INSERTION PORT-A-CATH;  Surgeon: Kinsinger, Arta Bruce, MD;  Location: [facility];  Service: General;  Laterality: Right;   SALPINGOOPHORECTOMY Left 03/31/2016   Procedure: LEFT SALPINGO OOPHORECTOMY WITH FROZEN SECTION,  ABDOMINAL HYSTERECTOMY WITH BILATERAL SALPINGO-OOPHORECTOMY;  Surgeon: Jonnie Kind, MD;  Location: [facility];  Service:  Gynecology;  Laterality: Left;    SOCIAL HISTORY:  Social History   Socioeconomic History   Marital status: Divorced    Spouse name: Not on file   Number of children: Not on file   Years of education: Not on file   Highest education level: Not on file  Occupational History   Not on file  Tobacco Use   Smoking status: Former    Packs/day: 0.50    Years:  5.00    Pack years: 2.50    Types: Cigarettes    Quit date: 03/28/2015    Years since quitting: 6.0   Smokeless tobacco: Never  Vaping Use   Vaping Use: Never used  Substance and Sexual Activity   Alcohol use: No   Drug use: Never   Sexual activity: Yes    Partners: Male    Birth control/protection: Surgical  Other Topics Concern   Not on file  Social History Narrative   Not on file   Social Determinants of Health   Financial Resource Strain: Not on file  Food Insecurity: Not on file  Transportation Needs: Not on file  Physical Activity: Not on file  Stress: Not on file  Social Connections: Not on file  Intimate Partner Violence: Not on file    FAMILY HISTORY:  Family History  Problem Relation Age of Onset   Hypertension Mother    Diabetes Mother    Cirrhosis Father     CURRENT MEDICATIONS:  Current Outpatient Medications  Medication Sig Dispense Refill   amoxicillin (AMOXIL) 500 MG capsule Take 500 mg by mouth 3 (three) times daily.     BEVACIZUMAB IV Inject 5 mg/kg into the vein every 14 (fourteen) days.     fluorouracil CALGB 19509 in sodium chloride 0.9 % 150 mL Inject 2,400 mg/m2 into the vein over 48 hr.     gabapentin (NEURONTIN) 100 MG capsule Take 1 capsule (100 mg total) by mouth 3 (three) times daily. 90 capsule 3   LEUCOVORIN CALCIUM IV Inject 400 mg/m2 into the vein every 21 ( twenty-one) days.     lidocaine-prilocaine (EMLA) cream Apply small amount to port a cath site and cover with plastic wrap 1 hour prior to chemotherapy appointments 30 g 3   nystatin (MYCOSTATIN) 100000 UNIT/ML suspension Take by mouth.     PARoxetine (PAXIL) 40 MG tablet Take 1 tablet (40 mg total) by mouth every morning. 90 tablet 3   polyethylene glycol (MIRALAX / GLYCOLAX) packet Take 17 g by mouth every other day.     potassium chloride SA (KLOR-CON) 20 MEQ tablet Take 1 tablet (20 mEq total) by mouth daily. 30 tablet 3   triamterene-hydrochlorothiazide (MAXZIDE-25) 37.5-25 MG  tablet Take 1 tablet by mouth daily. 30 tablet 3   hydrOXYzine (VISTARIL) 25 MG capsule Take 1 capsule (25 mg total) by mouth 3 (three) times daily as needed. Prn anxiety (Patient not taking: Reported on 04/08/2021) 60 capsule 2   prochlorperazine (COMPAZINE) 10 MG tablet Take 1 tablet (10 mg total) by mouth every 6 (six) hours as needed (Nausea or vomiting). (Patient not taking: Reported on 04/08/2021) 30 tablet 1   No current facility-administered medications for this visit.    ALLERGIES:  No Known Allergies  PHYSICAL EXAM:  Performance status (ECOG): 1 - Symptomatic but completely ambulatory  Vitals:   04/08/21 0829  BP: (!) 166/92  Pulse: 86  Resp: 19  Temp: (!) 96.7 F (35.9 C)  SpO2: 98%   Wt Readings from Last 3  Encounters:  03/25/21 168 lb (76.2 kg)  03/11/21 167 lb 12.8 oz (76.1 kg)  02/25/21 170 lb 6.4 oz (77.3 kg)   Physical Exam Vitals reviewed.  Constitutional:      Appearance: Normal appearance.  Cardiovascular:     Rate and Rhythm: Normal rate and regular rhythm.     Pulses: Normal pulses.     Heart sounds: Normal heart sounds.  Pulmonary:     Effort: Pulmonary effort is normal.     Breath sounds: Normal breath sounds.  Neurological:     General: No focal deficit present.     Mental Status: She is alert and oriented to person, place, and time.  Psychiatric:        Mood and Affect: Mood normal.        Behavior: Behavior normal.    LABORATORY DATA:  I have reviewed the labs as listed.  CBC Latest Ref Rng & Units 03/25/2021 03/11/2021 02/25/2021  WBC 4.0 - 10.5 K/uL 5.5 6.2 5.4  Hemoglobin 12.0 - 15.0 g/dL 13.5 12.9 13.4  Hematocrit 36.0 - 46.0 % 39.9 38.0 40.0  Platelets 150 - 400 K/uL 115(L) 139(L) 112(L)   CMP Latest Ref Rng & Units 04/08/2021 03/25/2021 03/11/2021  Glucose 70 - 99 mg/dL 116(H) 144(H) 99  BUN 8 - 23 mg/dL 11 14 10   Creatinine 0.44 - 1.00 mg/dL 0.94 0.87 0.85  Sodium 135 - 145 mmol/L 134(L) 134(L) 134(L)  Potassium 3.5 - 5.1 mmol/L  3.4(L) 3.3(L) 3.3(L)  Chloride 98 - 111 mmol/L 98 99 98  CO2 22 - 32 mmol/L 28 26 28   Calcium 8.9 - 10.3 mg/dL 8.9 8.6(L) 8.7(L)  Total Protein 6.5 - 8.1 g/dL 7.5 7.5 7.5  Total Bilirubin 0.3 - 1.2 mg/dL 0.9 0.7 0.8  Alkaline Phos 38 - 126 U/L 100 95 96  AST 15 - 41 U/L 36 27 24  ALT 0 - 44 U/L 30 22 21     DIAGNOSTIC IMAGING:  I have independently reviewed the scans and discussed with the patient. No results found.   ASSESSMENT:  1.  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 -CT chest on 05/02/2020 with no evidence of pulmonary metastatic disease. -CTAP on 05/07/2020 with multiple lesions throughout the liver, largest in the right lobe measuring 3.9 cm, lateral segment left lobe measuring 2.6 cm and inferior right lobe confluent lesion 4.2 x 3.7 cm.  No lymphadenopathy. -CEA on 05/02/2020-60.2. -FOLFOX and bevacizumab started on 06/04/2020. 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   2.  Iron deficiency anemia: -Feraheme on 04/30/2020 and 05/06/2020.   3.  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 -Smoked for 3 to 4 years and quit. -Mother had cancer, type unknown.  Maternal uncle had lung cancer.  Maternal aunt had lung cancer.   PLAN:  1.  Metastatic colon adenocarcinoma to liver, MS-stable: - CT CAP AAA from 02/07/2021 showed multifocal liver lesions which are stable. - Last CEA was 2.1 on 03/11/2021. - She is tolerating infusional 5-FU and bevacizumab very well.  Occasional mucositis is controlled well with mouthwash.  No clear diarrhea.  She empties bag 1-2 times per day. - No bleeding issues.  Reviewed labs from 04/08/2021 which showed normal LFTs.  Mild  thrombocytopenia with platelet count 119 stable.  White  count is normal. - She will proceed with treatment today and in 2 weeks.  RTC 4 weeks.  We will plan to repeat CT CAP and CEA level.   2.  Iron deficiency anemia: - Hemoglobin today 13.8.  No parenteral iron therapy.   3.  Severe hypokalemia: - Continue potassium 20 mEq daily.  Potassium today is 3.4.   4.  Peripheral neuropathy: - She has tingling and numbness in the fingertips which is stable. - Continue gabapentin 100 mg 3 times daily.   5.  Anxiety: - Continue hydroxyzine 25 mg as needed.   Orders placed this encounter:  No orders of the defined types were placed in this encounter.    Derek Jack, MD [place] [phone]   I, Thana Ates, am acting as a scribe for Dr. Derek Jack.  I, Derek Jack MD, have reviewed the above documentation for accuracy and completeness, and I agree with the above.

## 2021-04-08 ENCOUNTER — Inpatient Hospital Stay (HOSPITAL_BASED_OUTPATIENT_CLINIC_OR_DEPARTMENT_OTHER): Payer: Medicaid Other | Admitting: Hematology

## 2021-04-08 ENCOUNTER — Inpatient Hospital Stay (HOSPITAL_COMMUNITY): Payer: Medicaid Other | Attending: Hematology

## 2021-04-08 ENCOUNTER — Inpatient Hospital Stay (HOSPITAL_COMMUNITY): Payer: Medicaid Other

## 2021-04-08 ENCOUNTER — Other Ambulatory Visit: Payer: Self-pay

## 2021-04-08 VITALS — BP 166/92 | HR 86 | Temp 96.7°F | Resp 19

## 2021-04-08 VITALS — BP 160/88 | Ht 63.0 in | Wt 166.4 lb

## 2021-04-08 DIAGNOSIS — F419 Anxiety disorder, unspecified: Secondary | ICD-10-CM | POA: Insufficient documentation

## 2021-04-08 DIAGNOSIS — G629 Polyneuropathy, unspecified: Secondary | ICD-10-CM | POA: Insufficient documentation

## 2021-04-08 DIAGNOSIS — Z87891 Personal history of nicotine dependence: Secondary | ICD-10-CM | POA: Insufficient documentation

## 2021-04-08 DIAGNOSIS — D509 Iron deficiency anemia, unspecified: Secondary | ICD-10-CM | POA: Diagnosis not present

## 2021-04-08 DIAGNOSIS — C189 Malignant neoplasm of colon, unspecified: Secondary | ICD-10-CM | POA: Diagnosis not present

## 2021-04-08 DIAGNOSIS — C787 Secondary malignant neoplasm of liver and intrahepatic bile duct: Secondary | ICD-10-CM

## 2021-04-08 DIAGNOSIS — G35 Multiple sclerosis: Secondary | ICD-10-CM | POA: Insufficient documentation

## 2021-04-08 DIAGNOSIS — E876 Hypokalemia: Secondary | ICD-10-CM | POA: Diagnosis not present

## 2021-04-08 DIAGNOSIS — Z5111 Encounter for antineoplastic chemotherapy: Secondary | ICD-10-CM | POA: Insufficient documentation

## 2021-04-08 LAB — MAGNESIUM: Magnesium: 2.2 mg/dL (ref 1.7–2.4)

## 2021-04-08 LAB — COMPREHENSIVE METABOLIC PANEL
ALT: 30 U/L (ref 0–44)
AST: 36 U/L (ref 15–41)
Albumin: 3.7 g/dL (ref 3.5–5.0)
Alkaline Phosphatase: 100 U/L (ref 38–126)
Anion gap: 8 (ref 5–15)
BUN: 11 mg/dL (ref 8–23)
CO2: 28 mmol/L (ref 22–32)
Calcium: 8.9 mg/dL (ref 8.9–10.3)
Chloride: 98 mmol/L (ref 98–111)
Creatinine, Ser: 0.94 mg/dL (ref 0.44–1.00)
GFR, Estimated: >60 mL/min (ref >60)
Glucose, Bld: 116 mg/dL — ABNORMAL HIGH (ref 70–99)
Potassium: 3.4 mmol/L — ABNORMAL LOW (ref 3.5–5.1)
Sodium: 134 mmol/L — ABNORMAL LOW (ref 135–145)
Total Bilirubin: 0.9 mg/dL (ref 0.3–1.2)
Total Protein: 7.5 g/dL (ref 6.5–8.1)

## 2021-04-08 LAB — CBC WITH DIFFERENTIAL/PLATELET
Abs Immature Granulocytes: 0.01 10*3/uL (ref 0.00–0.07)
Basophils Absolute: 0 10*3/uL (ref 0.0–0.1)
Basophils Relative: 1 %
Eosinophils Absolute: 0.1 10*3/uL (ref 0.0–0.5)
Eosinophils Relative: 2 %
HCT: 40.1 % (ref 36.0–46.0)
Hemoglobin: 13.8 g/dL (ref 12.0–15.0)
Immature Granulocytes: 0 %
Lymphocytes Relative: 32 %
Lymphs Abs: 1.6 10*3/uL (ref 0.7–4.0)
MCH: 31.8 pg (ref 26.0–34.0)
MCHC: 34.4 g/dL (ref 30.0–36.0)
MCV: 92.4 fL (ref 80.0–100.0)
Monocytes Absolute: 0.5 10*3/uL (ref 0.1–1.0)
Monocytes Relative: 11 %
Neutro Abs: 2.7 10*3/uL (ref 1.7–7.7)
Neutrophils Relative %: 54 %
Platelets: 119 10*3/uL — ABNORMAL LOW (ref 150–400)
RBC: 4.34 MIL/uL (ref 3.87–5.11)
RDW: 15.4 % (ref 11.5–15.5)
WBC: 5 10*3/uL (ref 4.0–10.5)
nRBC: 0 % (ref 0.0–0.2)

## 2021-04-08 MED ORDER — SODIUM CHLORIDE 0.9 % IV SOLN
1920.0000 mg/m2 | INTRAVENOUS | Status: DC
  Administered 2021-04-08: 3250 mg via INTRAVENOUS
  Filled 2021-04-08: qty 65

## 2021-04-08 MED ORDER — FLUOROURACIL CHEMO INJECTION 2.5 GM/50ML
320.0000 mg/m2 | Freq: Once | INTRAVENOUS | Status: AC
  Administered 2021-04-08: 550 mg via INTRAVENOUS
  Filled 2021-04-08: qty 11

## 2021-04-08 MED ORDER — PALONOSETRON HCL INJECTION 0.25 MG/5ML
0.2500 mg | Freq: Once | INTRAVENOUS | Status: AC
  Administered 2021-04-08: 0.25 mg via INTRAVENOUS
  Filled 2021-04-08: qty 5

## 2021-04-08 MED ORDER — SODIUM CHLORIDE 0.9 % IV SOLN
Freq: Once | INTRAVENOUS | Status: AC
Start: 2021-04-08 — End: 2021-04-08

## 2021-04-08 MED ORDER — SODIUM CHLORIDE 0.9 % IV SOLN
10.0000 mg | Freq: Once | INTRAVENOUS | Status: AC
  Administered 2021-04-08: 10 mg via INTRAVENOUS
  Filled 2021-04-08: qty 10

## 2021-04-08 MED ORDER — POTASSIUM CHLORIDE CRYS ER 20 MEQ PO TBCR
40.0000 meq | EXTENDED_RELEASE_TABLET | Freq: Once | ORAL | Status: AC
  Administered 2021-04-08: 40 meq via ORAL
  Filled 2021-04-08: qty 2

## 2021-04-08 MED ORDER — SODIUM CHLORIDE 0.9 % IV SOLN
5.0000 mg/kg | Freq: Once | INTRAVENOUS | Status: AC
  Administered 2021-04-08: 400 mg via INTRAVENOUS
  Filled 2021-04-08: qty 16

## 2021-04-08 MED ORDER — SODIUM CHLORIDE 0.9 % IV SOLN
400.0000 mg/m2 | Freq: Once | INTRAVENOUS | Status: AC
  Administered 2021-04-08: 680 mg via INTRAVENOUS
  Filled 2021-04-08: qty 34

## 2021-04-08 NOTE — Progress Notes (Signed)
Patient has been examined, vital signs and labs have been reviewed by Dr. Katragadda. ANC, Creatinine, LFTs, hemoglobin, and platelets are within treatment parameters per Dr. Katragadda. Patient may proceed with treatment per M.D.   

## 2021-04-08 NOTE — Patient Instructions (Signed)
[place] at [facility] Discharge Instructions  You were seen and examined by Dr. Delton Coombes today.  You will receive treatment today and in 2 weeks. Return as scheduled for office visit and treatments.   Thank you for choosing [place] at [facility] to provide your oncology and hematology care.  To afford each patient quality time with our provider, please arrive at least 15 minutes before your scheduled appointment time.   If you have a lab appointment with the [facility] please come in thru the Main Entrance and check in at the main information desk.  You need to re-schedule your appointment should you arrive 10 or more minutes late.  We strive to give you quality time with our providers, and arriving late affects you and other patients whose appointments are after yours.  Also, if you no show three or more times for appointments you may be dismissed from the clinic at the providers discretion.     Again, thank you for choosing [facility].  Our hope is that these requests will decrease the amount of time that you wait before being seen by our physicians.       _____________________________________________________________  Should you have questions after your visit to [facility], please contact our office at [phone] and follow the prompts.  Our office hours are 8:00 a.m. and 4:30 p.m. Monday - Friday.  Please note that voicemails left after 4:00 p.m. may not be returned until the following business day.  We are closed weekends and major holidays.  You do have access to a nurse 24-7, just call the main number to the clinic [phone] and do not press any options, hold on the line and a nurse will answer the phone.    For prescription refill requests, have your pharmacy contact our office and allow 72 hours.    Due to Covid, you will need to wear a mask upon entering the hospital. If you do not have a mask,  a mask will be given to you at the Main Entrance upon arrival. For doctor visits, patients may have 1 support person age 44 or older with them. For treatment visits, patients can not have anyone with them due to social distancing guidelines and our immunocompromised population.

## 2021-04-08 NOTE — Progress Notes (Signed)
Pt presents today for MVASI/5FU/Leuc per provider's order. Good for tx pending CBC results.   Blood pressure re-check 160/88, pt takes BP medication every AM and she did take it this morning. Potassium was 3.4 40 mEq p.o x 1 dose ordered and given per Dr.K's standing orders. Okay to proceed with treatment per Dr.K.  MVASI/5FU/Leucovorin given today per MD orders. Tolerated infusion without adverse affects. Vital signs stable. No complaints at this time. Discharged from clinic ambulatory in stable condition. Alert and oriented x 3. F/U with [facility] as scheduled. 5FU ambulatory pump infusing.

## 2021-04-08 NOTE — Patient Instructions (Addendum)
[place]  Discharge Instructions: Thank you for choosing [facility] to provide your oncology and hematology care.  If you have a lab appointment with the [facility], please come in thru the Main Entrance and check in at the main information desk.  Wear comfortable clothing and clothing appropriate for easy access to any Portacath or PICC line.   We strive to give you quality time with your provider. You may need to reschedule your appointment if you arrive late (15 or more minutes).  Arriving late affects you and other patients whose appointments are after yours.  Also, if you miss three or more appointments without notifying the office, you may be dismissed from the clinic at the provider's discretion.      For prescription refill requests, have your pharmacy contact our office and allow 72 hours for refills to be completed.    Today you received the following chemotherapy and/or immunotherapy agents MVASI/5FU/Leuc   To help prevent nausea and vomiting after your treatment, we encourage you to take your nausea medication as directed.  The chemotherapy medication bag should finish at 46 hours, 96 hours, or 7 days. For example, if your pump is scheduled for 46 hours and it was put on at 4:00 p.m., it should finish at 2:00 p.m. the day it is scheduled to come off regardless of your appointment time.     Estimated time to finish at 0935.   If the display on your pump reads "Low Volume" and it is beeping, take the batteries out of the pump and come to the cancer center for it to be taken off.   If the pump alarms go off prior to the pump reading "Low Volume" then call [phone] and someone can assist you.  If the plunger comes out and the chemotherapy medication is leaking out, please use your home chemo spill kit to clean up the spill. Do NOT use paper towels or other household products.  If you have problems or questions regarding your pump, please call  either [phone] (24 hours a day) or the cancer center Monday-Friday 8:00 a.m.- 4:30 p.m. at the clinic number and we will assist you. If you are unable to get assistance, then go to the nearest Emergency Department and ask the staff to contact the IV team for assistance.    BELOW ARE SYMPTOMS THAT SHOULD BE REPORTED IMMEDIATELY: *FEVER GREATER THAN 100.4 F (38 C) OR HIGHER *CHILLS OR SWEATING *NAUSEA AND VOMITING THAT IS NOT CONTROLLED WITH YOUR NAUSEA MEDICATION *UNUSUAL SHORTNESS OF BREATH *UNUSUAL BRUISING OR BLEEDING *URINARY PROBLEMS (pain or burning when urinating, or frequent urination) *BOWEL PROBLEMS (unusual diarrhea, constipation, pain near the anus) TENDERNESS IN MOUTH AND THROAT WITH OR WITHOUT PRESENCE OF ULCERS (sore throat, sores in mouth, or a toothache) UNUSUAL RASH, SWELLING OR PAIN  UNUSUAL VAGINAL DISCHARGE OR ITCHING   Items with * indicate a potential emergency and should be followed up as soon as possible or go to the Emergency Department if any problems should occur.  Please show the CHEMOTHERAPY ALERT CARD or IMMUNOTHERAPY ALERT CARD at check-in to the Emergency Department and triage nurse.  Should you have questions after your visit or need to cancel or reschedule your appointment, please contact [facility] [phone]  and follow the prompts.  Office hours are 8:00 a.m. to 4:30 p.m. Monday - Friday. Please note that voicemails left after 4:00 p.m. may not be returned until the following business day.  We  are closed weekends and major holidays. You have access to a nurse at all times for urgent questions. Please call the main number to the clinic [phone] and follow the prompts.  For any non-urgent questions, you may also contact your provider using MyChart. We now offer e-Visits for anyone 75 and older to request care online for non-urgent symptoms. For details visit [email or web address].   Also download the MyChart app! Go to the app  store, search "MyChart", open the app, select [facility], and log in with your MyChart username and password.  Due to Covid, a mask is required upon entering the hospital/clinic. If you do not have a mask, one will be given to you upon arrival. For doctor visits, patients may have 1 support person aged 77 or older with them. For treatment visits, patients cannot have anyone with them due to current Covid guidelines and our immunocompromised population. [place]  Discharge Instructions: Thank you for choosing [facility] to provide your oncology and hematology care.  If you have a lab appointment with the [facility], please come in thru the Main Entrance and check in at the main information desk.  Wear comfortable clothing and clothing appropriate for easy access to any Portacath or PICC line.   We strive to give you quality time with your provider. You may need to reschedule your appointment if you arrive late (15 or more minutes).  Arriving late affects you and other patients whose appointments are after yours.  Also, if you miss three or more appointments without notifying the office, you may be dismissed from the clinic at the provider's discretion.      For prescription refill requests, have your pharmacy contact our office and allow 72 hours for refills to be completed.    BELOW ARE SYMPTOMS THAT SHOULD BE REPORTED IMMEDIATELY: *FEVER GREATER THAN 100.4 F (38 C) OR HIGHER *CHILLS OR SWEATING *NAUSEA AND VOMITING THAT IS NOT CONTROLLED WITH YOUR NAUSEA MEDICATION *UNUSUAL SHORTNESS OF BREATH *UNUSUAL BRUISING OR BLEEDING *URINARY PROBLEMS (pain or burning when urinating, or frequent urination) *BOWEL PROBLEMS (unusual diarrhea, constipation, pain near the anus) TENDERNESS IN MOUTH AND THROAT WITH OR WITHOUT PRESENCE OF ULCERS (sore throat, sores in mouth, or a toothache) UNUSUAL RASH, SWELLING OR PAIN  UNUSUAL VAGINAL DISCHARGE OR ITCHING   Items with *  indicate a potential emergency and should be followed up as soon as possible or go to the Emergency Department if any problems should occur.  Please show the CHEMOTHERAPY ALERT CARD or IMMUNOTHERAPY ALERT CARD at check-in to the Emergency Department and triage nurse.  Should you have questions after your visit or need to cancel or reschedule your appointment, please contact [facility] [phone]  and follow the prompts.  Office hours are 8:00 a.m. to 4:30 p.m. Monday - Friday. Please note that voicemails left after 4:00 p.m. may not be returned until the following business day.  We are closed weekends and major holidays. You have access to a nurse at all times for urgent questions. Please call the main number to the clinic [phone] and follow the prompts.  For any non-urgent questions, you may also contact your provider using MyChart. We now offer e-Visits for anyone 91 and older to request care online for non-urgent symptoms. For details visit [email or web address].   Also download the MyChart app! Go to the app store, search "MyChart", open the app, select [place], and log in with your MyChart username and password.  Due to Covid, a mask is required upon entering the hospital/clinic. If you do not have a mask, one will be given to you upon arrival. For doctor visits, patients may have 1 support person aged 90 or older with them. For treatment visits, patients cannot have anyone with them due to current Covid guidelines and our immunocompromised population.

## 2021-04-08 NOTE — Progress Notes (Signed)
Patients port flushed without difficulty.  Good blood return noted with no bruising or swelling noted at site.  Stable during access and blood draw.  Patient to remain accessed for treatment. 

## 2021-04-09 LAB — CEA: CEA: 1.9 ng/mL (ref 0.0–4.7)

## 2021-04-10 ENCOUNTER — Inpatient Hospital Stay (HOSPITAL_COMMUNITY): Payer: Medicaid Other

## 2021-04-10 ENCOUNTER — Other Ambulatory Visit: Payer: Self-pay

## 2021-04-10 VITALS — BP 166/80 | HR 71 | Temp 98.1°F | Resp 18

## 2021-04-10 DIAGNOSIS — C189 Malignant neoplasm of colon, unspecified: Secondary | ICD-10-CM

## 2021-04-10 DIAGNOSIS — C787 Secondary malignant neoplasm of liver and intrahepatic bile duct: Secondary | ICD-10-CM

## 2021-04-10 DIAGNOSIS — Z5111 Encounter for antineoplastic chemotherapy: Secondary | ICD-10-CM | POA: Diagnosis not present

## 2021-04-10 MED ORDER — SODIUM CHLORIDE 0.9% FLUSH
10.0000 mL | INTRAVENOUS | Status: DC | PRN
Start: 2021-04-10 — End: 2021-04-10
  Administered 2021-04-10: 10 mL

## 2021-04-10 MED ORDER — HEPARIN SOD (PORK) LOCK FLUSH 100 UNIT/ML IV SOLN
500.0000 [IU] | Freq: Once | INTRAVENOUS | Status: AC | PRN
  Administered 2021-04-10: 500 [IU]

## 2021-04-10 NOTE — Progress Notes (Signed)
Patient presents today for 5FU pump stop and disconnection after 46 hour continous infusion.   5FU pump deaccessed.  Patients port flushed without difficulty.  Good blood return noted with no bruising or swelling noted at site.  needle removed intact.  Band aid applied.  VSS with discharge and left in satisfactory condition  with no s/s of distress noted.

## 2021-04-10 NOTE — Patient Instructions (Signed)
[facility]  Discharge Instructions: Thank you for choosing [facility] to provide your oncology and hematology care.  If you have a lab appointment with the [facility], please come in thru the Main Entrance and check in at the main information desk.  Wear comfortable clothing and clothing appropriate for easy access to any Portacath or PICC line.   We strive to give you quality time with your provider. You may need to reschedule your appointment if you arrive late (15 or more minutes).  Arriving late affects you and other patients whose appointments are after yours.  Also, if you miss three or more appointments without notifying the office, you may be dismissed from the clinic at the provider's discretion.      For prescription refill requests, have your pharmacy contact our office and allow 72 hours for refills to be completed.    Today you received the following chemotherapy and/or immunotherapy agents Pump stop, PORT deaccess and flush      To help prevent nausea and vomiting after your treatment, we encourage you to take your nausea medication as directed.  BELOW ARE SYMPTOMS THAT SHOULD BE REPORTED IMMEDIATELY: *FEVER GREATER THAN 100.4 F (38 C) OR HIGHER *CHILLS OR SWEATING *NAUSEA AND VOMITING THAT IS NOT CONTROLLED WITH YOUR NAUSEA MEDICATION *UNUSUAL SHORTNESS OF BREATH *UNUSUAL BRUISING OR BLEEDING *URINARY PROBLEMS (pain or burning when urinating, or frequent urination) *BOWEL PROBLEMS (unusual diarrhea, constipation, pain near the anus) TENDERNESS IN MOUTH AND THROAT WITH OR WITHOUT PRESENCE OF ULCERS (sore throat, sores in mouth, or a toothache) UNUSUAL RASH, SWELLING OR PAIN  UNUSUAL VAGINAL DISCHARGE OR ITCHING   Items with * indicate a potential emergency and should be followed up as soon as possible or go to the Emergency Department if any problems should occur.  Please show the CHEMOTHERAPY ALERT CARD or IMMUNOTHERAPY ALERT CARD at  check-in to the Emergency Department and triage nurse.  Should you have questions after your visit or need to cancel or reschedule your appointment, please contact [facility] [phone]  and follow the prompts.  Office hours are 8:00 a.m. to 4:30 p.m. Monday - Friday. Please note that voicemails left after 4:00 p.m. may not be returned until the following business day.  We are closed weekends and major holidays. You have access to a nurse at all times for urgent questions. Please call the main number to the clinic [phone] and follow the prompts.  For any non-urgent questions, you may also contact your provider using MyChart. We now offer e-Visits for anyone 54 and older to request care online for non-urgent symptoms. For details visit [email or web address].   Also download the MyChart app! Go to the app store, search "MyChart", open the app, select [facility], and log in with your MyChart username and password.  Due to Covid, a mask is required upon entering the hospital/clinic. If you do not have a mask, one will be given to you upon arrival. For doctor visits, patients may have 1 support person aged 15 or older with them. For treatment visits, patients cannot have anyone with them due to current Covid guidelines and our immunocompromised population.

## 2021-04-22 ENCOUNTER — Ambulatory Visit (HOSPITAL_COMMUNITY): Payer: Medicaid Other | Admitting: Hematology

## 2021-04-22 ENCOUNTER — Other Ambulatory Visit: Payer: Self-pay

## 2021-04-22 ENCOUNTER — Inpatient Hospital Stay (HOSPITAL_COMMUNITY): Payer: Medicaid Other

## 2021-04-22 ENCOUNTER — Encounter (HOSPITAL_COMMUNITY): Payer: Self-pay

## 2021-04-22 VITALS — BP 145/79 | HR 74 | Temp 98.3°F | Resp 18

## 2021-04-22 DIAGNOSIS — Z5111 Encounter for antineoplastic chemotherapy: Secondary | ICD-10-CM | POA: Diagnosis not present

## 2021-04-22 DIAGNOSIS — C787 Secondary malignant neoplasm of liver and intrahepatic bile duct: Secondary | ICD-10-CM

## 2021-04-22 DIAGNOSIS — C189 Malignant neoplasm of colon, unspecified: Secondary | ICD-10-CM

## 2021-04-22 LAB — COMPREHENSIVE METABOLIC PANEL
ALT: 26 U/L (ref 0–44)
AST: 26 U/L (ref 15–41)
Albumin: 3.7 g/dL (ref 3.5–5.0)
Alkaline Phosphatase: 96 U/L (ref 38–126)
Anion gap: 8 (ref 5–15)
BUN: 11 mg/dL (ref 8–23)
CO2: 28 mmol/L (ref 22–32)
Calcium: 9 mg/dL (ref 8.9–10.3)
Chloride: 99 mmol/L (ref 98–111)
Creatinine, Ser: 0.83 mg/dL (ref 0.44–1.00)
GFR, Estimated: >60 mL/min (ref >60)
Glucose, Bld: 118 mg/dL — ABNORMAL HIGH (ref 70–99)
Potassium: 3.3 mmol/L — ABNORMAL LOW (ref 3.5–5.1)
Sodium: 135 mmol/L (ref 135–145)
Total Bilirubin: 0.8 mg/dL (ref 0.3–1.2)
Total Protein: 7.7 g/dL (ref 6.5–8.1)

## 2021-04-22 LAB — CBC WITH DIFFERENTIAL/PLATELET
Abs Immature Granulocytes: 0.01 10*3/uL (ref 0.00–0.07)
Basophils Absolute: 0 10*3/uL (ref 0.0–0.1)
Basophils Relative: 1 %
Eosinophils Absolute: 0.1 10*3/uL (ref 0.0–0.5)
Eosinophils Relative: 2 %
HCT: 42.1 % (ref 36.0–46.0)
Hemoglobin: 14.5 g/dL (ref 12.0–15.0)
Immature Granulocytes: 0 %
Lymphocytes Relative: 28 %
Lymphs Abs: 1.8 10*3/uL (ref 0.7–4.0)
MCH: 32.3 pg (ref 26.0–34.0)
MCHC: 34.4 g/dL (ref 30.0–36.0)
MCV: 93.8 fL (ref 80.0–100.0)
Monocytes Absolute: 0.6 10*3/uL (ref 0.1–1.0)
Monocytes Relative: 9 %
Neutro Abs: 3.8 10*3/uL (ref 1.7–7.7)
Neutrophils Relative %: 60 %
Platelets: 128 10*3/uL — ABNORMAL LOW (ref 150–400)
RBC: 4.49 MIL/uL (ref 3.87–5.11)
RDW: 15.7 % — ABNORMAL HIGH (ref 11.5–15.5)
WBC: 6.3 10*3/uL (ref 4.0–10.5)
nRBC: 0 % (ref 0.0–0.2)

## 2021-04-22 LAB — URINALYSIS, DIPSTICK ONLY
Bilirubin Urine: NEGATIVE
Glucose, UA: NEGATIVE mg/dL
Hgb urine dipstick: NEGATIVE
Ketones, ur: NEGATIVE mg/dL
Leukocytes,Ua: NEGATIVE
Nitrite: NEGATIVE
Protein, ur: NEGATIVE mg/dL
Specific Gravity, Urine: 1.013 (ref 1.005–1.030)
pH: 6 (ref 5.0–8.0)

## 2021-04-22 LAB — MAGNESIUM: Magnesium: 1.8 mg/dL (ref 1.7–2.4)

## 2021-04-22 MED ORDER — SODIUM CHLORIDE 0.9 % IV SOLN
5.0000 mg/kg | Freq: Once | INTRAVENOUS | Status: AC
  Administered 2021-04-22: 400 mg via INTRAVENOUS
  Filled 2021-04-22: qty 16

## 2021-04-22 MED ORDER — PALONOSETRON HCL INJECTION 0.25 MG/5ML
0.2500 mg | Freq: Once | INTRAVENOUS | Status: AC
  Administered 2021-04-22: 0.25 mg via INTRAVENOUS
  Filled 2021-04-22: qty 5

## 2021-04-22 MED ORDER — SODIUM CHLORIDE 0.9 % IV SOLN
1920.0000 mg/m2 | INTRAVENOUS | Status: DC
  Administered 2021-04-22: 3250 mg via INTRAVENOUS
  Filled 2021-04-22: qty 65

## 2021-04-22 MED ORDER — SODIUM CHLORIDE 0.9% FLUSH: 10.0000 mL | INTRAVENOUS | Status: DC | PRN

## 2021-04-22 MED ORDER — SODIUM CHLORIDE 0.9 % IV SOLN: Freq: Once | INTRAVENOUS | Status: AC

## 2021-04-22 MED ORDER — SODIUM CHLORIDE 0.9 % IV SOLN
10.0000 mg | Freq: Once | INTRAVENOUS | Status: AC
  Administered 2021-04-22: 10 mg via INTRAVENOUS
  Filled 2021-04-22: qty 10

## 2021-04-22 MED ORDER — POTASSIUM CHLORIDE CRYS ER 20 MEQ PO TBCR
40.0000 meq | EXTENDED_RELEASE_TABLET | Freq: Once | ORAL | Status: AC
  Administered 2021-04-22: 40 meq via ORAL
  Filled 2021-04-22: qty 2

## 2021-04-22 MED ORDER — SODIUM CHLORIDE 0.9 % IV SOLN
400.0000 mg/m2 | Freq: Once | INTRAVENOUS | Status: AC
  Administered 2021-04-22: 680 mg via INTRAVENOUS
  Filled 2021-04-22: qty 34

## 2021-04-22 MED ORDER — FLUOROURACIL CHEMO INJECTION 2.5 GM/50ML
320.0000 mg/m2 | Freq: Once | INTRAVENOUS | Status: AC
  Administered 2021-04-22: 550 mg via INTRAVENOUS
  Filled 2021-04-22: qty 11

## 2021-04-22 NOTE — Progress Notes (Signed)
[facility] here for leucovorin, avastin, 5FU.  Potassium 3.3.  40 meq potassium PO given per standing orders. Vital signs and labs WNL for treatment today.  Tolerated treatment well today without incidence. Stable during and after infusion.  Discharged with ambulatory 50fu pump running in stable condition ambulatory.  AVS reviewed.

## 2021-04-22 NOTE — Patient Instructions (Signed)
[facility]  Discharge Instructions: Thank you for choosing [facility] to provide your oncology and hematology care.  If you have a lab appointment with the [facility], please come in thru the Main Entrance and check in at the main information desk.  Wear comfortable clothing and clothing appropriate for easy access to any Portacath or PICC line.   We strive to give you quality time with your provider. You may need to reschedule your appointment if you arrive late (15 or more minutes).  Arriving late affects you and other patients whose appointments are after yours.  Also, if you miss three or more appointments without notifying the office, you may be dismissed from the clinic at the provider's discretion.      For prescription refill requests, have your pharmacy contact our office and allow 72 hours for refills to be completed.    Today you received the following chemotherapy and/or immunotherapy agents leucovorin, avastin, 5FU   Potassium 3.3, 40 meq potassium PO given today    To help prevent nausea and vomiting after your treatment, we encourage you to take your nausea medication as directed.  BELOW ARE SYMPTOMS THAT SHOULD BE REPORTED IMMEDIATELY: *FEVER GREATER THAN 100.4 F (38 C) OR HIGHER *CHILLS OR SWEATING *NAUSEA AND VOMITING THAT IS NOT CONTROLLED WITH YOUR NAUSEA MEDICATION *UNUSUAL SHORTNESS OF BREATH *UNUSUAL BRUISING OR BLEEDING *URINARY PROBLEMS (pain or burning when urinating, or frequent urination) *BOWEL PROBLEMS (unusual diarrhea, constipation, pain near the anus) TENDERNESS IN MOUTH AND THROAT WITH OR WITHOUT PRESENCE OF ULCERS (sore throat, sores in mouth, or a toothache) UNUSUAL RASH, SWELLING OR PAIN  UNUSUAL VAGINAL DISCHARGE OR ITCHING   Items with * indicate a potential emergency and should be followed up as soon as possible or go to the Emergency Department if any problems should occur.  Please show the CHEMOTHERAPY ALERT CARD  or IMMUNOTHERAPY ALERT CARD at check-in to the Emergency Department and triage nurse.  Should you have questions after your visit or need to cancel or reschedule your appointment, please contact [facility] [phone]  and follow the prompts.  Office hours are 8:00 a.m. to 4:30 p.m. Monday - Friday. Please note that voicemails left after 4:00 p.m. may not be returned until the following business day.  We are closed weekends and major holidays. You have access to a nurse at all times for urgent questions. Please call the main number to the clinic [phone] and follow the prompts.  For any non-urgent questions, you may also contact your provider using MyChart. We now offer e-Visits for anyone 66 and older to request care online for non-urgent symptoms. For details visit [email or web address].   Also download the MyChart app! Go to the app store, search "MyChart", open the app, select [facility], and log in with your MyChart username and password.  Due to Covid, a mask is required upon entering the hospital/clinic. If you do not have a mask, one will be given to you upon arrival. For doctor visits, patients may have 1 support person aged 7 or older with them. For treatment visits, patients cannot have anyone with them due to current Covid guidelines and our immunocompromised population.   Bevacizumab injection What is this medication? BEVACIZUMAB (be va SIZ yoo mab) is a monoclonal antibody. It is used to treat many types of cancer. This medicine may be used for other purposes; ask your health care provider or pharmacist if you have questions. COMMON BRAND NAME(S): Avastin,  MVASI, Noah Charon What should I tell my care team before I take this medication? They need to know if you have any of these conditions: diabetes heart disease high blood pressure history of coughing up blood prior anthracycline chemotherapy (e.g., doxorubicin, daunorubicin, epirubicin) recent or ongoing  radiation therapy recent or planning to have surgery stroke an unusual or allergic reaction to bevacizumab, hamster proteins, mouse proteins, other medicines, foods, dyes, or preservatives pregnant or trying to get pregnant breast-feeding How should I use this medication? This medicine is for infusion into a vein. It is given by a health care professional in a hospital or clinic setting. Talk to your pediatrician regarding the use of this medicine in children. Special care may be needed. Overdosage: If you think you have taken too much of this medicine contact a poison control center or emergency room at once. NOTE: This medicine is only for you. Do not share this medicine with others. What if I miss a dose? It is important not to miss your dose. Call your doctor or health care professional if you are unable to keep an appointment. What may interact with this medication? Interactions are not expected. This list may not describe all possible interactions. Give your health care provider a list of all the medicines, herbs, non-prescription drugs, or dietary supplements you use. Also tell them if you smoke, drink alcohol, or use illegal drugs. Some items may interact with your medicine. What should I watch for while using this medication? Your condition will be monitored carefully while you are receiving this medicine. You will need important blood work and urine testing done while you are taking this medicine. This medicine may increase your risk to bruise or bleed. Call your doctor or health care professional if you notice any unusual bleeding. Before having surgery, talk to your health care provider to make sure it is ok. This drug can increase the risk of poor healing of your surgical site or wound. You will need to stop this drug for 28 days before surgery. After surgery, wait at least 28 days before restarting this drug. Make sure the surgical site or wound is healed enough before restarting  this drug. Talk to your health care provider if questions. Do not become pregnant while taking this medicine or for 6 months after stopping it. Women should inform their doctor if they wish to become pregnant or think they might be pregnant. There is a potential for serious side effects to an unborn child. Talk to your health care professional or pharmacist for more information. Do not breast-feed an infant while taking this medicine and for 6 months after the last dose. This medicine has caused ovarian failure in some women. This medicine may interfere with the ability to have a child. You should talk to your doctor or health care professional if you are concerned about your fertility. What side effects may I notice from receiving this medication? Side effects that you should report to your doctor or health care professional as soon as possible: allergic reactions like skin rash, itching or hives, swelling of the face, lips, or tongue chest pain or chest tightness chills coughing up blood high fever seizures severe constipation signs and symptoms of bleeding such as bloody or black, tarry stools; red or dark-brown urine; spitting up blood or brown material that looks like coffee grounds; red spots on the skin; unusual bruising or bleeding from the eye, gums, or nose signs and symptoms of a blood clot such as breathing problems;  chest pain; severe, sudden headache; pain, swelling, warmth in the leg signs and symptoms of a stroke like changes in vision; confusion; trouble speaking or understanding; severe headaches; sudden numbness or weakness of the face, arm or leg; trouble walking; dizziness; loss of balance or coordination stomach pain sweating swelling of legs or ankles vomiting weight gain Side effects that usually do not require medical attention (report to your doctor or health care professional if they continue or are bothersome): back pain changes in taste decreased appetite dry  skin nausea tiredness This list may not describe all possible side effects. Call your doctor for medical advice about side effects. You may report side effects to FDA at 1-800-FDA-1088. Where should I keep my medication? This drug is given in a hospital or clinic and will not be stored at home. NOTE: This sheet is a summary. It may not cover all possible information. If you have questions about this medicine, talk to your doctor, pharmacist, or health care provider.  2022 Elsevier/Gold Standard (2019-04-12 10:50:46)  Leucovorin injection What is this medication? LEUCOVORIN (loo koe VOR in) is used to prevent or treat the harmful effects of some medicines. This medicine is used to treat anemia caused by a low amount of folic acid in the body. It is also used with 5-fluorouracil (5-FU) to treat colon cancer. This medicine may be used for other purposes; ask your health care provider or pharmacist if you have questions. What should I tell my care team before I take this medication? They need to know if you have any of these conditions: anemia from low levels of vitamin B-12 in the blood an unusual or allergic reaction to leucovorin, folic acid, other medicines, foods, dyes, or preservatives pregnant or trying to get pregnant breast-feeding How should I use this medication? This medicine is for injection into a muscle or into a vein. It is given by a health care professional in a hospital or clinic setting. Talk to your pediatrician regarding the use of this medicine in children. Special care may be needed. Overdosage: If you think you have taken too much of this medicine contact a poison control center or emergency room at once. NOTE: This medicine is only for you. Do not share this medicine with others. What if I miss a dose? This does not apply. What may interact with this medication? capecitabine fluorouracil phenobarbital phenytoin primidone trimethoprim-sulfamethoxazole This list  may not describe all possible interactions. Give your health care provider a list of all the medicines, herbs, non-prescription drugs, or dietary supplements you use. Also tell them if you smoke, drink alcohol, or use illegal drugs. Some items may interact with your medicine. What should I watch for while using this medication? Your condition will be monitored carefully while you are receiving this medicine. This medicine may increase the side effects of 5-fluorouracil, 5-FU. Tell your doctor or health care professional if you have diarrhea or mouth sores that do not get better or that get worse. What side effects may I notice from receiving this medication? Side effects that you should report to your doctor or health care professional as soon as possible: allergic reactions like skin rash, itching or hives, swelling of the face, lips, or tongue breathing problems fever, infection mouth sores unusual bleeding or bruising unusually weak or tired Side effects that usually do not require medical attention (report to your doctor or health care professional if they continue or are bothersome): constipation or diarrhea loss of appetite nausea, vomiting This list  may not describe all possible side effects. Call your doctor for medical advice about side effects. You may report side effects to FDA at 1-800-FDA-1088. Where should I keep my medication? This drug is given in a hospital or clinic and will not be stored at home. NOTE: This sheet is a summary. It may not cover all possible information. If you have questions about this medicine, talk to your doctor, pharmacist, or health care provider.  2022 Elsevier/Gold Standard (2007-12-20 16:50:29)  Fluorouracil, 5-FU injection What is this medication? FLUOROURACIL, 5-FU (flure oh YOOR a sil) is a chemotherapy drug. It slows the growth of cancer cells. This medicine is used to treat many types of cancer like breast cancer, colon or rectal cancer,  pancreatic cancer, and stomach cancer. This medicine may be used for other purposes; ask your health care provider or pharmacist if you have questions. COMMON BRAND NAME(S): Adrucil What should I tell my care team before I take this medication? They need to know if you have any of these conditions: blood disorders dihydropyrimidine dehydrogenase (DPD) deficiency infection (especially a virus infection such as chickenpox, cold sores, or herpes) kidney disease liver disease malnourished, poor nutrition recent or ongoing radiation therapy an unusual or allergic reaction to fluorouracil, other chemotherapy, other medicines, foods, dyes, or preservatives pregnant or trying to get pregnant breast-feeding How should I use this medication? This drug is given as an infusion or injection into a vein. It is administered in a hospital or clinic by a specially trained health care professional. Talk to your pediatrician regarding the use of this medicine in children. Special care may be needed. Overdosage: If you think you have taken too much of this medicine contact a poison control center or emergency room at once. NOTE: This medicine is only for you. Do not share this medicine with others. What if I miss a dose? It is important not to miss your dose. Call your doctor or health care professional if you are unable to keep an appointment. What may interact with this medication? Do not take this medicine with any of the following medications: live virus vaccines This medicine may also interact with the following medications: medicines that treat or prevent blood clots like warfarin, enoxaparin, and dalteparin This list may not describe all possible interactions. Give your health care provider a list of all the medicines, herbs, non-prescription drugs, or dietary supplements you use. Also tell them if you smoke, drink alcohol, or use illegal drugs. Some items may interact with your medicine. What should  I watch for while using this medication? Visit your doctor for checks on your progress. This drug may make you feel generally unwell. This is not uncommon, as chemotherapy can affect healthy cells as well as cancer cells. Report any side effects. Continue your course of treatment even though you feel ill unless your doctor tells you to stop. In some cases, you may be given additional medicines to help with side effects. Follow all directions for their use. Call your doctor or health care professional for advice if you get a fever, chills or sore throat, or other symptoms of a cold or flu. Do not treat yourself. This drug decreases your body's ability to fight infections. Try to avoid being around people who are sick. This medicine may increase your risk to bruise or bleed. Call your doctor or health care professional if you notice any unusual bleeding. Be careful brushing and flossing your teeth or using a toothpick because you may get an  infection or bleed more easily. If you have any dental work done, tell your dentist you are receiving this medicine. Avoid taking products that contain aspirin, acetaminophen, ibuprofen, naproxen, or ketoprofen unless instructed by your doctor. These medicines may hide a fever. Do not become pregnant while taking this medicine. Women should inform their doctor if they wish to become pregnant or think they might be pregnant. There is a potential for serious side effects to an unborn child. Talk to your health care professional or pharmacist for more information. Do not breast-feed an infant while taking this medicine. Men should inform their doctor if they wish to father a child. This medicine may lower sperm counts. Do not treat diarrhea with over the counter products. Contact your doctor if you have diarrhea that lasts more than 2 days or if it is severe and watery. This medicine can make you more sensitive to the sun. Keep out of the sun. If you cannot avoid being in  the sun, wear protective clothing and use sunscreen. Do not use sun lamps or tanning beds/booths. What side effects may I notice from receiving this medication? Side effects that you should report to your doctor or health care professional as soon as possible: allergic reactions like skin rash, itching or hives, swelling of the face, lips, or tongue low blood counts - this medicine may decrease the number of white blood cells, red blood cells and platelets. You may be at increased risk for infections and bleeding. signs of infection - fever or chills, cough, sore throat, pain or difficulty passing urine signs of decreased platelets or bleeding - bruising, pinpoint red spots on the skin, black, tarry stools, blood in the urine signs of decreased red blood cells - unusually weak or tired, fainting spells, lightheadedness breathing problems changes in vision chest pain mouth sores nausea and vomiting pain, swelling, redness at site where injected pain, tingling, numbness in the hands or feet redness, swelling, or sores on hands or feet stomach pain unusual bleeding Side effects that usually do not require medical attention (report to your doctor or health care professional if they continue or are bothersome): changes in finger or toe nails diarrhea dry or itchy skin hair loss headache loss of appetite sensitivity of eyes to the light stomach upset unusually teary eyes This list may not describe all possible side effects. Call your doctor for medical advice about side effects. You may report side effects to FDA at 1-800-FDA-1088. Where should I keep my medication? This drug is given in a hospital or clinic and will not be stored at home. NOTE: This sheet is a summary. It may not cover all possible information. If you have questions about this medicine, talk to your doctor, pharmacist, or health care provider.  2022 Elsevier/Gold Standard (2019-05-16 15:00:03)

## 2021-04-22 NOTE — Progress Notes (Signed)
Chaplain engaged in an initial visit with [facility].  She voiced that she has been taking treatments for over a year now and that she has been doing well.  She talked about this time as being a blessed one because of still being able to function, not losing her hair and doing relatively well with her treatment.    Chaplain affirmed the beauty of this time in her life and the optimism she holds.  Chaplain offered presence, listening and support.     04/22/21 1100  Clinical Encounter Type  Visited With Patient  Visit Type Initial

## 2021-04-23 ENCOUNTER — Encounter (HOSPITAL_COMMUNITY): Payer: Self-pay | Admitting: Hematology

## 2021-04-24 ENCOUNTER — Inpatient Hospital Stay (HOSPITAL_COMMUNITY): Payer: Medicaid Other

## 2021-04-24 ENCOUNTER — Other Ambulatory Visit: Payer: Self-pay

## 2021-04-24 VITALS — BP 136/64 | HR 73 | Temp 97.7°F | Resp 18

## 2021-04-24 DIAGNOSIS — Z5111 Encounter for antineoplastic chemotherapy: Secondary | ICD-10-CM | POA: Diagnosis not present

## 2021-04-24 DIAGNOSIS — C189 Malignant neoplasm of colon, unspecified: Secondary | ICD-10-CM

## 2021-04-24 MED ORDER — SODIUM CHLORIDE 0.9% FLUSH
10.0000 mL | INTRAVENOUS | Status: DC | PRN
  Administered 2021-04-24: 10 mL

## 2021-04-24 MED ORDER — HEPARIN SOD (PORK) LOCK FLUSH 100 UNIT/ML IV SOLN
500.0000 [IU] | Freq: Once | INTRAVENOUS | Status: AC | PRN
  Administered 2021-04-24: 500 [IU]

## 2021-04-24 NOTE — Progress Notes (Signed)
Pt presents today for 5FU chemotherapy pump disconnection per provider's order. Vital signs stable and pt voiced no new complaints at this time. Port flush easily without difficulty with 10 ml of NS and 40ml of heparin.   5FU chemotherapy pump disconnection done today per MD orders. Tolerated infusion without adverse affects. Vital signs stable. No complaints at this time. Discharged from clinic ambulatory in stable condition. Alert and oriented x 3. F/U with [facility] as scheduled.

## 2021-04-26 ENCOUNTER — Other Ambulatory Visit (HOSPITAL_COMMUNITY): Payer: Self-pay | Admitting: Hematology

## 2021-04-28 ENCOUNTER — Encounter (HOSPITAL_COMMUNITY): Payer: Self-pay | Admitting: Hematology

## 2021-05-02 ENCOUNTER — Other Ambulatory Visit (HOSPITAL_COMMUNITY): Payer: Self-pay

## 2021-05-02 MED ORDER — ALPRAZOLAM 0.25 MG PO TABS: 0.2500 mg | ORAL_TABLET | Freq: Two times a day (BID) | ORAL | 0 refills | Status: DC | PRN

## 2021-05-02 NOTE — Telephone Encounter (Signed)
Patient called reporting that her nerves are bad, states that she recently had a bad break up. Patient states that Vistaril is not helping. Order received for Xanax 0.25mg  BID PRN. Attempted to call patient but unable to reach her, no VM available.

## 2021-05-05 ENCOUNTER — Ambulatory Visit (HOSPITAL_COMMUNITY)
Admission: RE | Admit: 2021-05-05 | Discharge: 2021-05-05 | Disposition: A | Discharge status: Home / Self Care | Payer: Medicaid Other | Source: Ambulatory Visit | Attending: Hematology | Admitting: Hematology

## 2021-05-05 ENCOUNTER — Other Ambulatory Visit (HOSPITAL_COMMUNITY): Payer: Self-pay

## 2021-05-05 ENCOUNTER — Other Ambulatory Visit: Payer: Self-pay

## 2021-05-05 DIAGNOSIS — C189 Malignant neoplasm of colon, unspecified: Secondary | ICD-10-CM | POA: Insufficient documentation

## 2021-05-05 DIAGNOSIS — C787 Secondary malignant neoplasm of liver and intrahepatic bile duct: Secondary | ICD-10-CM | POA: Insufficient documentation

## 2021-05-05 MED ORDER — IOHEXOL 300 MG/ML  SOLN
100.0000 mL | Freq: Once | INTRAMUSCULAR | Status: AC | PRN
  Administered 2021-05-05: 100 mL via INTRAVENOUS

## 2021-05-05 NOTE — Progress Notes (Signed)
Susan Martin, [place] [address]   CLINIC:  Medical Oncology/Hematology  PCP:  Patient, No Pcp Per (Inactive) None None   REASON FOR VISIT:  Follow-up for metastatic colon cancer to liver  PRIOR THERAPY: Sigmoid colectomy and end colostomy on 04/29/2020  NGS Results: Foundation 1 KRAS wildtype, MS--stable, TMB 4 Muts/Mb  CURRENT THERAPY: FOLFOX, Avastin & Aloxi every 2 weeks  BRIEF ONCOLOGIC HISTORY:  Oncology History  Metastatic colon cancer to liver ([place])  05/16/2020 Initial Diagnosis   Metastatic colon cancer to liver ([place])   06/03/2020 Genetic Testing   Foundation One:     06/04/2020 -  Chemotherapy   Patient is on Treatment Plan : COLORECTAL FOLFOX + Bevacizumab q14d       CANCER STAGING: Cancer Staging No matching staging information was found for the patient.  INTERVAL HISTORY:  Susan Martin, a 60 y.o. female, returns for routine follow-up and consideration for next cycle of chemotherapy. Jeraldine was last seen on 04/08/2021.  Due for cycle #25 of FOLFOX + Bevacizumab  today.   Overall, she tells me she has been feeling pretty well, and she is accompanied by her friend. She reports increased anxiety and depression due to a recent break-up, but she does not want to take Xanax as it causes fatigue. She reports diarrhea over the past 3 days. Her appetite has decreased. She denies any current bleeding.  She reports soreness at the corners of her mouth which is helped with salt and baking soda.  Overall, she feels ready for next cycle of chemo today.   REVIEW OF SYSTEMS:  Review of Systems  Constitutional:  Positive for appetite change (80%) and fatigue (50%).  HENT:   Negative for nosebleeds.   Respiratory:  Negative for hemoptysis.   Gastrointestinal:  Negative for blood in stool and diarrhea.  Genitourinary:  Negative for hematuria and vaginal bleeding.   Hematological:  Does not bruise/bleed easily.   Psychiatric/Behavioral:  Positive for depression and sleep disturbance. The patient is nervous/anxious.   All other systems reviewed and are negative.  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 Delton Coombes   04-29-2020 [facility] surgery for perforated colon s/p sigmoid colectomy w/ colostomy; dx  Stage IV colon cancer mets to liver   Anxiety    Depression    Hypertension    followed by dr Glo Herring and oncology  (05-27-2020 per pt does not have pcp yet)   IDA (iron deficiency anemia)    Pulmonary nodule, right    Past Surgical History:  Procedure Laterality Date   ABDOMINAL HYSTERECTOMY  03-31-2016   @[facility]    W/  BILATERAL SALPINOOPHORECTOMY    APPLICATION OF WOUND VAC N/A 04/29/2020   Procedure: APPLICATION OF WOUND VAC;  Surgeon: Mickeal Skinner, MD;  Location: [facility];  Service: General;  Laterality: N/A;   ENDOMETRIAL ABLATION     LAPAROTOMY N/A 04/29/2020   Procedure: EXPLORATORY LAPAROTOMY;  Surgeon: Mickeal Skinner, MD;  Location: [facility];  Service: General;  Laterality: N/A;   PARTIAL COLECTOMY N/A 04/29/2020   Procedure: PARTIAL COLECTOMY WITH END COLOSTOMY;  Surgeon: Kieth Brightly Arta Bruce, MD;  Location: [facility];  Service: General;  Laterality: N/A;   PORTACATH PLACEMENT Right 05/28/2020   Procedure: INSERTION PORT-A-CATH;  Surgeon: Kinsinger, Arta Bruce, MD;  Location: [facility];  Service: General;  Laterality: Right;   SALPINGOOPHORECTOMY Left 03/31/2016  Procedure: LEFT SALPINGO OOPHORECTOMY WITH FROZEN SECTION,  ABDOMINAL HYSTERECTOMY WITH BILATERAL SALPINGO-OOPHORECTOMY;  Surgeon: Jonnie Kind, MD;  Location: [facility];  Service: Gynecology;  Laterality: Left;    SOCIAL HISTORY:  Social History   Socioeconomic History   Marital status: Divorced    Spouse name: Not on file   Number of children: Not on file   Years of education: Not on file    Highest education level: Not on file  Occupational History   Not on file  Tobacco Use   Smoking status: Former    Packs/day: 0.50    Years: 5.00    Pack years: 2.50    Types: Cigarettes    Quit date: 03/28/2015    Years since quitting: 6.1   Smokeless tobacco: Never  Vaping Use   Vaping Use: Never used  Substance and Sexual Activity   Alcohol use: No   Drug use: Never   Sexual activity: Yes    Partners: Male    Birth control/protection: Surgical  Other Topics Concern   Not on file  Social History Narrative   Not on file   Social Determinants of Health   Financial Resource Strain: Not on file  Food Insecurity: Not on file  Transportation Needs: Not on file  Physical Activity: Not on file  Stress: Not on file  Social Connections: Not on file  Intimate Partner Violence: Not on file    FAMILY HISTORY:  Family History  Problem Relation Age of Onset   Hypertension Mother    Diabetes Mother    Cirrhosis Father     CURRENT MEDICATIONS:  Current Outpatient Medications  Medication Sig Dispense Refill   ALPRAZolam (XANAX) 0.25 MG tablet Take 1 tablet (0.25 mg total) by mouth 2 (two) times daily as needed for anxiety. 30 tablet 0   amoxicillin (AMOXIL) 500 MG capsule Take 500 mg by mouth 3 (three) times daily.     BEVACIZUMAB IV Inject 5 mg/kg into the vein every 14 (fourteen) days.     fluorouracil CALGB 00867 in sodium chloride 0.9 % 150 mL Inject 2,400 mg/m2 into the vein over 48 hr.     gabapentin (NEURONTIN) 100 MG capsule Take 1 capsule (100 mg total) by mouth 3 (three) times daily. 90 capsule 3   hydrOXYzine (VISTARIL) 25 MG capsule Take 1 capsule (25 mg total) by mouth 3 (three) times daily as needed. Prn anxiety 60 capsule 2   LEUCOVORIN CALCIUM IV Inject 400 mg/m2 into the vein every 21 ( twenty-one) days.     lidocaine-prilocaine (EMLA) cream Apply small amount to port a cath site and cover with plastic wrap 1 hour prior to chemotherapy appointments 30 g 3    nystatin (MYCOSTATIN) 100000 UNIT/ML suspension Take by mouth.     PARoxetine (PAXIL) 40 MG tablet Take 1 tablet (40 mg total) by mouth every morning. 90 tablet 3   polyethylene glycol (MIRALAX / GLYCOLAX) packet Take 17 g by mouth every other day.     potassium chloride SA (KLOR-CON) 20 MEQ tablet TAKE 1 TABLET ONCE DAILY. 30 tablet 6   prochlorperazine (COMPAZINE) 10 MG tablet Take 1 tablet (10 mg total) by mouth every 6 (six) hours as needed (Nausea or vomiting). 30 tablet 1   triamterene-hydrochlorothiazide (MAXZIDE-25) 37.5-25 MG tablet Take 1 tablet by mouth daily. 30 tablet 3   No current facility-administered medications for this visit.    ALLERGIES:  No Known Allergies  PHYSICAL EXAM:  Performance status (ECOG): 1 - Symptomatic but  completely ambulatory  There were no vitals filed for this visit. Wt Readings from Last 3 Encounters:  04/22/21 167 lb 9.6 oz (76 kg)  04/08/21 166 lb 6.4 oz (75.5 kg)  03/25/21 168 lb (76.2 kg)   Physical Exam Vitals reviewed.  Constitutional:      Appearance: Normal appearance.  Cardiovascular:     Rate and Rhythm: Normal rate and regular rhythm.     Pulses: Normal pulses.     Heart sounds: Normal heart sounds.  Pulmonary:     Effort: Pulmonary effort is normal.     Breath sounds: Normal breath sounds.  Neurological:     General: No focal deficit present.     Mental Status: She is alert and oriented to person, place, and time.  Psychiatric:        Mood and Affect: Mood normal.        Behavior: Behavior normal.    LABORATORY DATA:  I have reviewed the labs as listed.  CBC Latest Ref Rng & Units 04/22/2021 04/08/2021 03/25/2021  WBC 4.0 - 10.5 K/uL 6.3 5.0 5.5  Hemoglobin 12.0 - 15.0 g/dL 14.5 13.8 13.5  Hematocrit 36.0 - 46.0 % 42.1 40.1 39.9  Platelets 150 - 400 K/uL 128(L) 119(L) 115(L)   CMP Latest Ref Rng & Units 04/22/2021 04/08/2021 03/25/2021  Glucose 70 - 99 mg/dL 118(H) 116(H) 144(H)  BUN 8 - 23 mg/dL 11 11 14    Creatinine 0.44 - 1.00 mg/dL 0.83 0.94 0.87  Sodium 135 - 145 mmol/L 135 134(L) 134(L)  Potassium 3.5 - 5.1 mmol/L 3.3(L) 3.4(L) 3.3(L)  Chloride 98 - 111 mmol/L 99 98 99  CO2 22 - 32 mmol/L 28 28 26   Calcium 8.9 - 10.3 mg/dL 9.0 8.9 8.6(L)  Total Protein 6.5 - 8.1 g/dL 7.7 7.5 7.5  Total Bilirubin 0.3 - 1.2 mg/dL 0.8 0.9 0.7  Alkaline Phos 38 - 126 U/L 96 100 95  AST 15 - 41 U/L 26 36 27  ALT 0 - 44 U/L 26 30 22     DIAGNOSTIC IMAGING:  I have independently reviewed the scans and discussed with the patient. No results found.   ASSESSMENT:  1.  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 -CT chest on 05/02/2020 with no evidence of pulmonary metastatic disease. -CTAP on 05/07/2020 with multiple lesions throughout the liver, largest in the right lobe measuring 3.9 cm, lateral segment left lobe measuring 2.6 cm and inferior right lobe confluent lesion 4.2 x 3.7 cm.  No lymphadenopathy. -CEA on 05/02/2020-60.2. -FOLFOX and bevacizumab started on 06/04/2020. 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   2.  Iron deficiency anemia: -Feraheme on 04/30/2020 and 05/06/2020.   3.  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 -Smoked for 3 to 4 years and quit. -Mother had cancer, type unknown.  Maternal uncle had lung cancer.  Maternal aunt had lung cancer.   PLAN:  1.  Metastatic colon adenocarcinoma to liver, MS-stable: - Reviewed CT CAP from 05/05/2021 which showed multiple hypodense liver lesions and small pulmonary nodules stable. - Last CEA was 1.9 on 04/08/2021. - Recommend continuing same therapy at this time.  She is tolerating it reasonably well. - Her creatinine has gone  up to 1.01 today.  She was recommended plenty of hydration. - She  will proceed with her next treatment today.  Subsequent treatment will be in the week of 05/26/2021.  I will see her back in 5 weeks for follow-up.   2.  Iron deficiency anemia: - Hemoglobin 14.8.  No parenteral iron therapy needed.   3.  Severe hypokalemia: - Continue potassium 20 mEq daily.  Potassium today is 2.8.  She reports that she had missed 3 to 4 days.   4.  Peripheral neuropathy: - Continue gabapentin 100 mg 3 times daily.  Tingling and numbness in the fingertips is stable.   5.  Anxiety: - She was taking hydroxyzine 25 mg as needed which is not helping.  She has gone through a bad break-up over the weekend. - We will give her Xanax 0.25 mg as needed which caused drowsiness. - We will start her on Ativan 0.5 mg every 8 hours as needed.  She was told to take half tablet if there is any drowsiness. - Continue Paxil 40 mg daily.   Orders placed this encounter:  No orders of the defined types were placed in this encounter.    Derek Jack, MD [place] [phone]   I, Thana Ates, am acting as a scribe for Dr. Derek Jack.  I, Derek Jack MD, have reviewed the above documentation for accuracy and completeness, and I agree with the above.

## 2021-05-06 ENCOUNTER — Inpatient Hospital Stay (HOSPITAL_COMMUNITY): Payer: Medicaid Other

## 2021-05-06 ENCOUNTER — Inpatient Hospital Stay (HOSPITAL_COMMUNITY): Payer: Medicaid Other | Attending: Hematology

## 2021-05-06 ENCOUNTER — Inpatient Hospital Stay (HOSPITAL_BASED_OUTPATIENT_CLINIC_OR_DEPARTMENT_OTHER): Payer: Medicaid Other | Admitting: Hematology

## 2021-05-06 VITALS — BP 186/89 | HR 89 | Temp 98.3°F | Resp 18

## 2021-05-06 VITALS — BP 176/84 | HR 91 | Temp 98.1°F | Resp 18 | Wt 162.2 lb

## 2021-05-06 DIAGNOSIS — C189 Malignant neoplasm of colon, unspecified: Secondary | ICD-10-CM | POA: Diagnosis not present

## 2021-05-06 DIAGNOSIS — G629 Polyneuropathy, unspecified: Secondary | ICD-10-CM | POA: Diagnosis not present

## 2021-05-06 DIAGNOSIS — C787 Secondary malignant neoplasm of liver and intrahepatic bile duct: Secondary | ICD-10-CM | POA: Insufficient documentation

## 2021-05-06 DIAGNOSIS — I10 Essential (primary) hypertension: Secondary | ICD-10-CM | POA: Diagnosis not present

## 2021-05-06 DIAGNOSIS — C187 Malignant neoplasm of sigmoid colon: Secondary | ICD-10-CM | POA: Insufficient documentation

## 2021-05-06 DIAGNOSIS — F419 Anxiety disorder, unspecified: Secondary | ICD-10-CM | POA: Insufficient documentation

## 2021-05-06 DIAGNOSIS — R911 Solitary pulmonary nodule: Secondary | ICD-10-CM | POA: Diagnosis not present

## 2021-05-06 DIAGNOSIS — Z79899 Other long term (current) drug therapy: Secondary | ICD-10-CM | POA: Insufficient documentation

## 2021-05-06 DIAGNOSIS — D509 Iron deficiency anemia, unspecified: Secondary | ICD-10-CM | POA: Diagnosis not present

## 2021-05-06 DIAGNOSIS — E876 Hypokalemia: Secondary | ICD-10-CM

## 2021-05-06 DIAGNOSIS — Z87891 Personal history of nicotine dependence: Secondary | ICD-10-CM | POA: Diagnosis not present

## 2021-05-06 DIAGNOSIS — Z5111 Encounter for antineoplastic chemotherapy: Secondary | ICD-10-CM | POA: Insufficient documentation

## 2021-05-06 DIAGNOSIS — R197 Diarrhea, unspecified: Secondary | ICD-10-CM | POA: Diagnosis not present

## 2021-05-06 LAB — MAGNESIUM: Magnesium: 2.1 mg/dL (ref 1.7–2.4)

## 2021-05-06 LAB — CBC WITH DIFFERENTIAL/PLATELET
Abs Immature Granulocytes: 0.02 10*3/uL (ref 0.00–0.07)
Basophils Absolute: 0 10*3/uL (ref 0.0–0.1)
Basophils Relative: 1 %
Eosinophils Absolute: 0 10*3/uL (ref 0.0–0.5)
Eosinophils Relative: 1 %
HCT: 42.8 % (ref 36.0–46.0)
Hemoglobin: 14.8 g/dL (ref 12.0–15.0)
Immature Granulocytes: 0 %
Lymphocytes Relative: 23 %
Lymphs Abs: 1.1 10*3/uL (ref 0.7–4.0)
MCH: 31.3 pg (ref 26.0–34.0)
MCHC: 34.6 g/dL (ref 30.0–36.0)
MCV: 90.5 fL (ref 80.0–100.0)
Monocytes Absolute: 0.4 10*3/uL (ref 0.1–1.0)
Monocytes Relative: 9 %
Neutro Abs: 3.3 10*3/uL (ref 1.7–7.7)
Neutrophils Relative %: 66 %
Platelets: 143 10*3/uL — ABNORMAL LOW (ref 150–400)
RBC: 4.73 MIL/uL (ref 3.87–5.11)
RDW: 15.6 % — ABNORMAL HIGH (ref 11.5–15.5)
WBC: 4.9 10*3/uL (ref 4.0–10.5)
nRBC: 0 % (ref 0.0–0.2)

## 2021-05-06 LAB — COMPREHENSIVE METABOLIC PANEL
ALT: 30 U/L (ref 0–44)
AST: 33 U/L (ref 15–41)
Albumin: 4.1 g/dL (ref 3.5–5.0)
Alkaline Phosphatase: 97 U/L (ref 38–126)
Anion gap: 11 (ref 5–15)
BUN: 12 mg/dL (ref 8–23)
CO2: 25 mmol/L (ref 22–32)
Calcium: 9.1 mg/dL (ref 8.9–10.3)
Chloride: 96 mmol/L — ABNORMAL LOW (ref 98–111)
Creatinine, Ser: 1.01 mg/dL — ABNORMAL HIGH (ref 0.44–1.00)
GFR, Estimated: >60 mL/min (ref >60)
Glucose, Bld: 124 mg/dL — ABNORMAL HIGH (ref 70–99)
Potassium: 2.8 mmol/L — ABNORMAL LOW (ref 3.5–5.1)
Sodium: 132 mmol/L — ABNORMAL LOW (ref 135–145)
Total Bilirubin: 1 mg/dL (ref 0.3–1.2)
Total Protein: 8.3 g/dL — ABNORMAL HIGH (ref 6.5–8.1)

## 2021-05-06 MED ORDER — POTASSIUM CHLORIDE CRYS ER 10 MEQ PO TBCR: 20.0000 meq | EXTENDED_RELEASE_TABLET | Freq: Every day | ORAL | 6 refills | Status: DC

## 2021-05-06 MED ORDER — PALONOSETRON HCL INJECTION 0.25 MG/5ML
0.2500 mg | Freq: Once | INTRAVENOUS | Status: AC
  Administered 2021-05-06: 0.25 mg via INTRAVENOUS
  Filled 2021-05-06: qty 5

## 2021-05-06 MED ORDER — SODIUM CHLORIDE 0.9 % IV SOLN
1920.0000 mg/m2 | INTRAVENOUS | Status: DC
  Administered 2021-05-06: 3250 mg via INTRAVENOUS
  Filled 2021-05-06: qty 65

## 2021-05-06 MED ORDER — FLUOROURACIL CHEMO INJECTION 2.5 GM/50ML
320.0000 mg/m2 | Freq: Once | INTRAVENOUS | Status: AC
  Administered 2021-05-06: 550 mg via INTRAVENOUS
  Filled 2021-05-06: qty 11

## 2021-05-06 MED ORDER — POTASSIUM CHLORIDE CRYS ER 20 MEQ PO TBCR
40.0000 meq | EXTENDED_RELEASE_TABLET | Freq: Once | ORAL | Status: AC
  Administered 2021-05-06: 40 meq via ORAL
  Filled 2021-05-06: qty 2

## 2021-05-06 MED ORDER — LORAZEPAM 0.5 MG PO TABS: 0.5000 mg | ORAL_TABLET | Freq: Three times a day (TID) | ORAL | 0 refills | Status: DC | PRN

## 2021-05-06 MED ORDER — SODIUM CHLORIDE 0.9 % IV SOLN
10.0000 mg | Freq: Once | INTRAVENOUS | Status: AC
  Administered 2021-05-06: 10 mg via INTRAVENOUS
  Filled 2021-05-06: qty 10

## 2021-05-06 MED ORDER — SODIUM CHLORIDE 0.9 % IV SOLN: Freq: Once | INTRAVENOUS | Status: AC

## 2021-05-06 MED ORDER — SODIUM CHLORIDE 0.9 % IV SOLN
5.0000 mg/kg | Freq: Once | INTRAVENOUS | Status: AC
  Administered 2021-05-06: 400 mg via INTRAVENOUS
  Filled 2021-05-06: qty 16

## 2021-05-06 MED ORDER — SODIUM CHLORIDE 0.9 % IV SOLN
400.0000 mg/m2 | Freq: Once | INTRAVENOUS | Status: AC
  Administered 2021-05-06: 680 mg via INTRAVENOUS
  Filled 2021-05-06: qty 34

## 2021-05-06 NOTE — Patient Instructions (Signed)
[place] at [facility] Discharge Instructions  You were seen and examined today by Dr. Delton Coombes.  You will proceed with treatment today.  We sent a prescription for Ativan (lorazepam) for anxiety - if you find that a whole pill is too much for you, you may take 1/2 tablet as needed.  We sent a prescription for smaller potassium pills.  Take 2 pills daily as prescribed.   Return as scheduled for lab work, office visit, and tx.    Thank you for choosing [place] at [facility] to provide your oncology and hematology care.  To afford each patient quality time with our provider, please arrive at least 15 minutes before your scheduled appointment time.   If you have a lab appointment with the [facility] please come in thru the Main Entrance and check in at the main information desk.  You need to re-schedule your appointment should you arrive 10 or more minutes late.  We strive to give you quality time with our providers, and arriving late affects you and other patients whose appointments are after yours.  Also, if you no show three or more times for appointments you may be dismissed from the clinic at the providers discretion.     Again, thank you for choosing [facility].  Our hope is that these requests will decrease the amount of time that you wait before being seen by our physicians.       _____________________________________________________________  Should you have questions after your visit to [facility], please contact our office at [phone] and follow the prompts.  Our office hours are 8:00 a.m. and 4:30 p.m. Monday - Friday.  Please note that voicemails left after 4:00 p.m. may not be returned until the following business day.  We are closed weekends and major holidays.  You do have access to a nurse 24-7, just call the main number to the clinic [phone] and do not press any options, hold on the  line and a nurse will answer the phone.    For prescription refill requests, have your pharmacy contact our office and allow 72 hours.    Due to Covid, you will need to wear a mask upon entering the hospital. If you do not have a mask, a mask will be given to you at the Main Entrance upon arrival. For doctor visits, patients may have 1 support person age 43 or older with them. For treatment visits, patients can not have anyone with them due to social distancing guidelines and our immunocompromised population.

## 2021-05-06 NOTE — Progress Notes (Signed)
Patient has been examined, vital signs and labs have been reviewed by Dr. Katragadda. ANC, Creatinine, LFTs, hemoglobin, and platelets are within treatment parameters per Dr. Katragadda. Patient may proceed with treatment per M.D.   

## 2021-05-06 NOTE — Progress Notes (Signed)
Pt presents today for MVASI/Leucovorin/5FU per provider's order. Vital signs and labs WNL for treatment today. Okay to proceed with treatment per Dr.K.. Potassium is 2.8 give K-dur 40 mEq po x 1 dose per Dr.K.  MVASI/Leucovorin/5FU and potassium 85mEq p.o x 1 dose given today per MD orders. Tolerated infusion without adverse affects. Vital signs stable. No complaints at this time. Discharged from clinic ambulatory in stable condition. Alert and oriented x 3. F/U with [facility] as scheduled. 5FU ambulatory infusing.

## 2021-05-06 NOTE — Patient Instructions (Signed)
[facility]  Discharge Instructions: Thank you for choosing [facility] to provide your oncology and hematology care.  If you have a lab appointment with the [facility], please come in thru the Main Entrance and check in at the main information desk.  Wear comfortable clothing and clothing appropriate for easy access to any Portacath or PICC line.   We strive to give you quality time with your provider. You may need to reschedule your appointment if you arrive late (15 or more minutes).  Arriving late affects you and other patients whose appointments are after yours.  Also, if you miss three or more appointments without notifying the office, you may be dismissed from the clinic at the provider's discretion.      For prescription refill requests, have your pharmacy contact our office and allow 72 hours for refills to be completed.    Today you received the following chemotherapy and/or immunotherapy agents MVASI,Leucovorin, and 5FU.   To help prevent nausea and vomiting after your treatment, we encourage you to take your nausea medication as directed.  The chemotherapy medication bag should finish at 46 hours, 96 hours, or 7 days. For example, if your pump is scheduled for 46 hours and it was put on at 4:00 p.m., it should finish at 2:00 p.m. the day it is scheduled to come off regardless of your appointment time.     Estimated time to finish at 1105.   If the display on your pump reads "Low Volume" and it is beeping, take the batteries out of the pump and come to the cancer center for it to be taken off.   If the pump alarms go off prior to the pump reading "Low Volume" then call [phone] and someone can assist you.  If the plunger comes out and the chemotherapy medication is leaking out, please use your home chemo spill kit to clean up the spill. Do NOT use paper towels or other household products.  If you have problems or questions regarding your pump,  please call either [phone] (24 hours a day) or the cancer center Monday-Friday 8:00 a.m.- 4:30 p.m. at the clinic number and we will assist you. If you are unable to get assistance, then go to the nearest Emergency Department and ask the staff to contact the IV team for assistance.    BELOW ARE SYMPTOMS THAT SHOULD BE REPORTED IMMEDIATELY: *FEVER GREATER THAN 100.4 F (38 C) OR HIGHER *CHILLS OR SWEATING *NAUSEA AND VOMITING THAT IS NOT CONTROLLED WITH YOUR NAUSEA MEDICATION *UNUSUAL SHORTNESS OF BREATH *UNUSUAL BRUISING OR BLEEDING *URINARY PROBLEMS (pain or burning when urinating, or frequent urination) *BOWEL PROBLEMS (unusual diarrhea, constipation, pain near the anus) TENDERNESS IN MOUTH AND THROAT WITH OR WITHOUT PRESENCE OF ULCERS (sore throat, sores in mouth, or a toothache) UNUSUAL RASH, SWELLING OR PAIN  UNUSUAL VAGINAL DISCHARGE OR ITCHING   Items with * indicate a potential emergency and should be followed up as soon as possible or go to the Emergency Department if any problems should occur.  Please show the CHEMOTHERAPY ALERT CARD or IMMUNOTHERAPY ALERT CARD at check-in to the Emergency Department and triage nurse.  Should you have questions after your visit or need to cancel or reschedule your appointment, please contact [facility] [phone]  and follow the prompts.  Office hours are 8:00 a.m. to 4:30 p.m. Monday - Friday. Please note that voicemails left after 4:00 p.m. may not be returned until the following business day.  We are closed weekends and major holidays. You have access to a nurse at all times for urgent questions. Please call the main number to the clinic [phone] and follow the prompts.  For any non-urgent questions, you may also contact your provider using MyChart. We now offer e-Visits for anyone 43 and older to request care online for non-urgent symptoms. For details visit [email or web address].   Also download the MyChart app! Go to  the app store, search "MyChart", open the app, select , and log in with your MyChart username and password.  Due to Covid, a mask is required upon entering the hospital/clinic. If you do not have a mask, one will be given to you upon arrival. For doctor visits, patients may have 1 support person aged 90 or older with them. For treatment visits, patients cannot have anyone with them due to current Covid guidelines and our immunocompromised population.

## 2021-05-07 LAB — CEA: CEA: 2 ng/mL (ref 0.0–4.7)

## 2021-05-08 ENCOUNTER — Other Ambulatory Visit: Payer: Self-pay

## 2021-05-08 ENCOUNTER — Inpatient Hospital Stay (HOSPITAL_COMMUNITY): Payer: Medicaid Other

## 2021-05-08 VITALS — BP 172/94 | HR 82 | Temp 99.2°F | Resp 18

## 2021-05-08 DIAGNOSIS — Z5111 Encounter for antineoplastic chemotherapy: Secondary | ICD-10-CM | POA: Diagnosis not present

## 2021-05-08 DIAGNOSIS — C189 Malignant neoplasm of colon, unspecified: Secondary | ICD-10-CM

## 2021-05-08 MED ORDER — SODIUM CHLORIDE 0.9% FLUSH
10.0000 mL | INTRAVENOUS | Status: DC | PRN
  Administered 2021-05-08: 10 mL

## 2021-05-08 MED ORDER — HEPARIN SOD (PORK) LOCK FLUSH 100 UNIT/ML IV SOLN
500.0000 [IU] | Freq: Once | INTRAVENOUS | Status: AC | PRN
  Administered 2021-05-08: 500 [IU]

## 2021-05-08 NOTE — Progress Notes (Signed)
Patient presents today for 5FU pump stop and disconnection after 46 hour continous infusion.   5FU pump deaccessed.  Patients port flushed without difficulty.  Good blood return noted with no bruising or swelling noted at site.  needle removed intact.  Band aid applied.  VSS with discharge and left in satisfactory condition via wheelchair with no s/s of distress noted.   Discharge from clinic ambulatory in stable condition.  Alert and oriented X 3.  Follow up with [facility] as scheduled.

## 2021-05-08 NOTE — Patient Instructions (Signed)
[facility]  Discharge Instructions: Thank you for choosing [facility] to provide your oncology and hematology care.  If you have a lab appointment with the [facility], please come in thru the Main Entrance and check in at the main information desk.  Wear comfortable clothing and clothing appropriate for easy access to any Portacath or PICC line.   We strive to give you quality time with your provider. You may need to reschedule your appointment if you arrive late (15 or more minutes).  Arriving late affects you and other patients whose appointments are after yours.  Also, if you miss three or more appointments without notifying the office, you may be dismissed from the clinic at the provider's discretion.      For prescription refill requests, have your pharmacy contact our office and allow 72 hours for refills to be completed.    Today you received the following chemotherapy and/or immunotherapy agents Pump stop       To help prevent nausea and vomiting after your treatment, we encourage you to take your nausea medication as directed.  BELOW ARE SYMPTOMS THAT SHOULD BE REPORTED IMMEDIATELY: *FEVER GREATER THAN 100.4 F (38 C) OR HIGHER *CHILLS OR SWEATING *NAUSEA AND VOMITING THAT IS NOT CONTROLLED WITH YOUR NAUSEA MEDICATION *UNUSUAL SHORTNESS OF BREATH *UNUSUAL BRUISING OR BLEEDING *URINARY PROBLEMS (pain or burning when urinating, or frequent urination) *BOWEL PROBLEMS (unusual diarrhea, constipation, pain near the anus) TENDERNESS IN MOUTH AND THROAT WITH OR WITHOUT PRESENCE OF ULCERS (sore throat, sores in mouth, or a toothache) UNUSUAL RASH, SWELLING OR PAIN  UNUSUAL VAGINAL DISCHARGE OR ITCHING   Items with * indicate a potential emergency and should be followed up as soon as possible or go to the Emergency Department if any problems should occur.  Please show the CHEMOTHERAPY ALERT CARD or IMMUNOTHERAPY ALERT CARD at check-in to the Emergency  Department and triage nurse.  Should you have questions after your visit or need to cancel or reschedule your appointment, please contact [facility] [phone]  and follow the prompts.  Office hours are 8:00 a.m. to 4:30 p.m. Monday - Friday. Please note that voicemails left after 4:00 p.m. may not be returned until the following business day.  We are closed weekends and major holidays. You have access to a nurse at all times for urgent questions. Please call the main number to the clinic [phone] and follow the prompts.  For any non-urgent questions, you may also contact your provider using MyChart. We now offer e-Visits for anyone 61 and older to request care online for non-urgent symptoms. For details visit [email or web address].   Also download the MyChart app! Go to the app store, search "MyChart", open the app, select [facility], and log in with your MyChart username and password.  Due to Covid, a mask is required upon entering the hospital/clinic. If you do not have a mask, one will be given to you upon arrival. For doctor visits, patients may have 1 support person aged 29 or older with them. For treatment visits, patients cannot have anyone with them due to current Covid guidelines and our immunocompromised population.

## 2021-05-15 ENCOUNTER — Other Ambulatory Visit (HOSPITAL_COMMUNITY): Payer: Self-pay | Admitting: Physician Assistant

## 2021-05-15 ENCOUNTER — Other Ambulatory Visit (HOSPITAL_COMMUNITY): Payer: Self-pay

## 2021-05-15 DIAGNOSIS — F419 Anxiety disorder, unspecified: Secondary | ICD-10-CM

## 2021-05-15 MED ORDER — LORAZEPAM 0.5 MG PO TABS: 0.5000 mg | ORAL_TABLET | Freq: Three times a day (TID) | ORAL | 0 refills | Status: DC | PRN

## 2021-05-15 NOTE — Telephone Encounter (Signed)
I will write a one-time 10-day refill since this medication was started by Dr. Delton Coombes at her last visit.  However, since it sounds like she is having some acute on chronic anxiety related to situational events, I highly suggest that she also seek treatment via a counselor.  Any further refill request for her benzodiazepines should be approved at the discretion of Dr. Delton Coombes.

## 2021-05-21 ENCOUNTER — Encounter (HOSPITAL_COMMUNITY): Payer: Self-pay | Admitting: Hematology

## 2021-05-27 ENCOUNTER — Inpatient Hospital Stay (HOSPITAL_COMMUNITY): Payer: Medicaid Other

## 2021-05-27 ENCOUNTER — Ambulatory Visit (HOSPITAL_COMMUNITY): Payer: Medicaid Other | Admitting: Hematology

## 2021-05-27 ENCOUNTER — Other Ambulatory Visit: Payer: Self-pay

## 2021-05-27 ENCOUNTER — Other Ambulatory Visit (HOSPITAL_COMMUNITY): Payer: Self-pay

## 2021-05-27 ENCOUNTER — Encounter (HOSPITAL_COMMUNITY): Payer: Self-pay

## 2021-05-27 VITALS — BP 153/83 | HR 76 | Temp 97.6°F | Resp 18

## 2021-05-27 DIAGNOSIS — E876 Hypokalemia: Secondary | ICD-10-CM

## 2021-05-27 DIAGNOSIS — F419 Anxiety disorder, unspecified: Secondary | ICD-10-CM

## 2021-05-27 DIAGNOSIS — C189 Malignant neoplasm of colon, unspecified: Secondary | ICD-10-CM

## 2021-05-27 DIAGNOSIS — Z5111 Encounter for antineoplastic chemotherapy: Secondary | ICD-10-CM | POA: Diagnosis not present

## 2021-05-27 LAB — CBC WITH DIFFERENTIAL/PLATELET
Abs Immature Granulocytes: 0.01 10*3/uL (ref 0.00–0.07)
Basophils Absolute: 0 10*3/uL (ref 0.0–0.1)
Basophils Relative: 1 %
Eosinophils Absolute: 0.1 10*3/uL (ref 0.0–0.5)
Eosinophils Relative: 2 %
HCT: 41.3 % (ref 36.0–46.0)
Hemoglobin: 14.1 g/dL (ref 12.0–15.0)
Immature Granulocytes: 0 %
Lymphocytes Relative: 38 %
Lymphs Abs: 1.4 10*3/uL (ref 0.7–4.0)
MCH: 31.6 pg (ref 26.0–34.0)
MCHC: 34.1 g/dL (ref 30.0–36.0)
MCV: 92.6 fL (ref 80.0–100.0)
Monocytes Absolute: 0.6 10*3/uL (ref 0.1–1.0)
Monocytes Relative: 16 %
Neutro Abs: 1.6 10*3/uL — ABNORMAL LOW (ref 1.7–7.7)
Neutrophils Relative %: 43 %
Platelets: 133 10*3/uL — ABNORMAL LOW (ref 150–400)
RBC: 4.46 MIL/uL (ref 3.87–5.11)
RDW: 15.3 % (ref 11.5–15.5)
WBC: 3.8 10*3/uL — ABNORMAL LOW (ref 4.0–10.5)
nRBC: 0 % (ref 0.0–0.2)

## 2021-05-27 LAB — MAGNESIUM: Magnesium: 2 mg/dL (ref 1.7–2.4)

## 2021-05-27 LAB — COMPREHENSIVE METABOLIC PANEL
ALT: 31 U/L (ref 0–44)
AST: 35 U/L (ref 15–41)
Albumin: 3.8 g/dL (ref 3.5–5.0)
Alkaline Phosphatase: 96 U/L (ref 38–126)
Anion gap: 9 (ref 5–15)
BUN: 12 mg/dL (ref 8–23)
CO2: 26 mmol/L (ref 22–32)
Calcium: 9.2 mg/dL (ref 8.9–10.3)
Chloride: 99 mmol/L (ref 98–111)
Creatinine, Ser: 1.16 mg/dL — ABNORMAL HIGH (ref 0.44–1.00)
GFR, Estimated: 54 mL/min — ABNORMAL LOW (ref >60)
Glucose, Bld: 112 mg/dL — ABNORMAL HIGH (ref 70–99)
Potassium: 3.3 mmol/L — ABNORMAL LOW (ref 3.5–5.1)
Sodium: 134 mmol/L — ABNORMAL LOW (ref 135–145)
Total Bilirubin: 0.7 mg/dL (ref 0.3–1.2)
Total Protein: 7.5 g/dL (ref 6.5–8.1)

## 2021-05-27 MED ORDER — SODIUM CHLORIDE 0.9 % IV SOLN
400.0000 mg/m2 | Freq: Once | INTRAVENOUS | Status: AC
  Administered 2021-05-27: 680 mg via INTRAVENOUS
  Filled 2021-05-27: qty 34

## 2021-05-27 MED ORDER — SODIUM CHLORIDE 0.9 % IV SOLN
10.0000 mg | Freq: Once | INTRAVENOUS | Status: AC
  Administered 2021-05-27: 10 mg via INTRAVENOUS
  Filled 2021-05-27: qty 10

## 2021-05-27 MED ORDER — SODIUM CHLORIDE 0.9 % IV SOLN: Freq: Once | INTRAVENOUS | Status: AC

## 2021-05-27 MED ORDER — FLUOROURACIL CHEMO INJECTION 2.5 GM/50ML
320.0000 mg/m2 | Freq: Once | INTRAVENOUS | Status: AC
  Administered 2021-05-27: 550 mg via INTRAVENOUS
  Filled 2021-05-27: qty 11

## 2021-05-27 MED ORDER — SODIUM CHLORIDE 0.9% FLUSH
10.0000 mL | INTRAVENOUS | Status: DC | PRN
  Administered 2021-05-27: 10 mL

## 2021-05-27 MED ORDER — TRIAMTERENE-HCTZ 37.5-25 MG PO TABS: 1.0000 | ORAL_TABLET | Freq: Every day | ORAL | 3 refills | Status: DC

## 2021-05-27 MED ORDER — PAROXETINE HCL 40 MG PO TABS: 40.0000 mg | ORAL_TABLET | ORAL | 3 refills | Status: DC

## 2021-05-27 MED ORDER — SODIUM CHLORIDE 0.9 % IV SOLN
1920.0000 mg/m2 | INTRAVENOUS | Status: DC
  Administered 2021-05-27: 3250 mg via INTRAVENOUS
  Filled 2021-05-27: qty 65

## 2021-05-27 MED ORDER — LORAZEPAM 0.5 MG PO TABS: 0.5000 mg | ORAL_TABLET | Freq: Three times a day (TID) | ORAL | 0 refills | Status: DC | PRN

## 2021-05-27 MED ORDER — PALONOSETRON HCL INJECTION 0.25 MG/5ML
0.2500 mg | Freq: Once | INTRAVENOUS | Status: AC
  Administered 2021-05-27: 0.25 mg via INTRAVENOUS

## 2021-05-27 MED ORDER — POTASSIUM CHLORIDE CRYS ER 20 MEQ PO TBCR
40.0000 meq | EXTENDED_RELEASE_TABLET | Freq: Once | ORAL | Status: AC
  Administered 2021-05-27: 40 meq via ORAL

## 2021-05-27 MED ORDER — SODIUM CHLORIDE 0.9 % IV SOLN
5.0000 mg/kg | Freq: Once | INTRAVENOUS | Status: AC
  Administered 2021-05-27: 400 mg via INTRAVENOUS
  Filled 2021-05-27: qty 16

## 2021-05-27 MED ORDER — HYDROXYZINE PAMOATE 25 MG PO CAPS: 25.0000 mg | ORAL_CAPSULE | Freq: Three times a day (TID) | ORAL | 2 refills | Status: DC | PRN

## 2021-05-27 NOTE — Patient Instructions (Signed)
[facility]  Discharge Instructions: Thank you for choosing [facility] to provide your oncology and hematology care.  If you have a lab appointment with the [facility], please come in thru the Main Entrance and check in at the main information desk.  Wear comfortable clothing and clothing appropriate for easy access to any Portacath or PICC line.   We strive to give you quality time with your provider. You may need to reschedule your appointment if you arrive late (15 or more minutes).  Arriving late affects you and other patients whose appointments are after yours.  Also, if you miss three or more appointments without notifying the office, you may be dismissed from the clinic at the provider's discretion.      For prescription refill requests, have your pharmacy contact our office and allow 72 hours for refills to be completed.    Today you received the following chemotherapy and/or immunotherapy agents MVASI, leucovorin, and 5FU pump was connected.   To help prevent nausea and vomiting after your treatment, we encourage you to take your nausea medication as directed.  BELOW ARE SYMPTOMS THAT SHOULD BE REPORTED IMMEDIATELY: *FEVER GREATER THAN 100.4 F (38 C) OR HIGHER *CHILLS OR SWEATING *NAUSEA AND VOMITING THAT IS NOT CONTROLLED WITH YOUR NAUSEA MEDICATION *UNUSUAL SHORTNESS OF BREATH *UNUSUAL BRUISING OR BLEEDING *URINARY PROBLEMS (pain or burning when urinating, or frequent urination) *BOWEL PROBLEMS (unusual diarrhea, constipation, pain near the anus) TENDERNESS IN MOUTH AND THROAT WITH OR WITHOUT PRESENCE OF ULCERS (sore throat, sores in mouth, or a toothache) UNUSUAL RASH, SWELLING OR PAIN  UNUSUAL VAGINAL DISCHARGE OR ITCHING   Items with * indicate a potential emergency and should be followed up as soon as possible or go to the Emergency Department if any problems should occur.  Please show the CHEMOTHERAPY ALERT CARD or IMMUNOTHERAPY ALERT CARD  at check-in to the Emergency Department and triage nurse.  Should you have questions after your visit or need to cancel or reschedule your appointment, please contact [facility] [phone]  and follow the prompts.  Office hours are 8:00 a.m. to 4:30 p.m. Monday - Friday. Please note that voicemails left after 4:00 p.m. may not be returned until the following business day.  We are closed weekends and major holidays. You have access to a nurse at all times for urgent questions. Please call the main number to the clinic [phone] and follow the prompts.  For any non-urgent questions, you may also contact your provider using MyChart. We now offer e-Visits for anyone 91 and older to request care online for non-urgent symptoms. For details visit [email or web address].   Also download the MyChart app! Go to the app store, search "MyChart", open the app, select [facility], and log in with your MyChart username and password.  Due to Covid, a mask is required upon entering the hospital/clinic. If you do not have a mask, one will be given to you upon arrival. For doctor visits, patients may have 1 support person aged 46 or older with them. For treatment visits, patients cannot have anyone with them due to current Covid guidelines and our immunocompromised population.

## 2021-05-27 NOTE — Progress Notes (Signed)
Patient's potassium 3.3, 52mEq ordered per standing orders from Dr. Delton Coombes. Patient's BP 161/84, patient okay for treatment per Dr. Delton Coombes.  Patient tolerated chemotherapy with no complaints voiced. Side effects with management reviewed understanding verbalized. Port site clean and dry with no bruising or swelling noted at site. Good blood return noted before and after administration of chemotherapy. Chemo pump connected with no alarms noted. Patient left in satisfactory condition with VSS and no s/s of distress noted.

## 2021-05-29 ENCOUNTER — Inpatient Hospital Stay (HOSPITAL_COMMUNITY): Payer: Medicaid Other | Attending: Hematology

## 2021-05-29 ENCOUNTER — Other Ambulatory Visit: Payer: Self-pay

## 2021-05-29 VITALS — BP 176/86 | HR 67 | Temp 97.7°F | Resp 18

## 2021-05-29 DIAGNOSIS — E876 Hypokalemia: Secondary | ICD-10-CM | POA: Insufficient documentation

## 2021-05-29 DIAGNOSIS — Z801 Family history of malignant neoplasm of trachea, bronchus and lung: Secondary | ICD-10-CM | POA: Diagnosis not present

## 2021-05-29 DIAGNOSIS — Z5111 Encounter for antineoplastic chemotherapy: Secondary | ICD-10-CM | POA: Diagnosis present

## 2021-05-29 DIAGNOSIS — F419 Anxiety disorder, unspecified: Secondary | ICD-10-CM | POA: Insufficient documentation

## 2021-05-29 DIAGNOSIS — G6289 Other specified polyneuropathies: Secondary | ICD-10-CM | POA: Diagnosis not present

## 2021-05-29 DIAGNOSIS — Z87891 Personal history of nicotine dependence: Secondary | ICD-10-CM | POA: Insufficient documentation

## 2021-05-29 DIAGNOSIS — Z79899 Other long term (current) drug therapy: Secondary | ICD-10-CM | POA: Insufficient documentation

## 2021-05-29 DIAGNOSIS — C787 Secondary malignant neoplasm of liver and intrahepatic bile duct: Secondary | ICD-10-CM | POA: Diagnosis not present

## 2021-05-29 DIAGNOSIS — D509 Iron deficiency anemia, unspecified: Secondary | ICD-10-CM | POA: Insufficient documentation

## 2021-05-29 DIAGNOSIS — Z809 Family history of malignant neoplasm, unspecified: Secondary | ICD-10-CM | POA: Insufficient documentation

## 2021-05-29 DIAGNOSIS — C187 Malignant neoplasm of sigmoid colon: Secondary | ICD-10-CM | POA: Diagnosis not present

## 2021-05-29 DIAGNOSIS — G629 Polyneuropathy, unspecified: Secondary | ICD-10-CM | POA: Insufficient documentation

## 2021-05-29 MED ORDER — HEPARIN SOD (PORK) LOCK FLUSH 100 UNIT/ML IV SOLN
500.0000 [IU] | Freq: Once | INTRAVENOUS | Status: AC | PRN
  Administered 2021-05-29: 500 [IU]

## 2021-05-29 MED ORDER — SODIUM CHLORIDE 0.9% FLUSH
10.0000 mL | INTRAVENOUS | Status: DC | PRN
  Administered 2021-05-29: 10 mL

## 2021-05-29 NOTE — Progress Notes (Signed)
Pt presents today for chemotherapy pump disconnection per provider's order. Vital signs stable and pt voiced no new complaints at this time. Port flushed easily without difficulty with 10 ml of NS and 65ml of heparin. Good blood return noted with no bruising or swelling noted at the site. Needle removed intact.  Vital signs stable. No complaints at this time. Discharged from clinic ambulatory in stable condition. Alert and oriented x 3. F/U with [facility] as scheduled.

## 2021-05-29 NOTE — Patient Instructions (Signed)
[facility]  Discharge Instructions: ?Thank you for choosing [facility] to provide your oncology and hematology care.  ?If you have a lab appointment with the Cancer Center, please come in thru the Main Entrance and check in at the main information desk. ? ?Wear comfortable clothing and clothing appropriate for easy access to any Portacath or PICC line.  ? ?We strive to give you quality time with your provider. You may need to reschedule your appointment if you arrive late (15 or more minutes).  Arriving late affects you and other patients whose appointments are after yours.  Also, if you miss three or more appointments without notifying the office, you may be dismissed from the clinic at the provider?s discretion.    ?  ?For prescription refill requests, have your pharmacy contact our office and allow 72 hours for refills to be completed.   ? ?Today you received chemotherapy pump disconnection. ? ? ?BELOW ARE SYMPTOMS THAT SHOULD BE REPORTED IMMEDIATELY: ?*FEVER GREATER THAN 100.4 F (38 ?C) OR HIGHER ?*CHILLS OR SWEATING ?*NAUSEA AND VOMITING THAT IS NOT CONTROLLED WITH YOUR NAUSEA MEDICATION ?*UNUSUAL SHORTNESS OF BREATH ?*UNUSUAL BRUISING OR BLEEDING ?*URINARY PROBLEMS (pain or burning when urinating, or frequent urination) ?*BOWEL PROBLEMS (unusual diarrhea, constipation, pain near the anus) ?TENDERNESS IN MOUTH AND THROAT WITH OR WITHOUT PRESENCE OF ULCERS (sore throat, sores in mouth, or a toothache) ?UNUSUAL RASH, SWELLING OR PAIN  ?UNUSUAL VAGINAL DISCHARGE OR ITCHING  ? ?Items with * indicate a potential emergency and should be followed up as soon as possible or go to the Emergency Department if any problems should occur. ? ?Please show the CHEMOTHERAPY ALERT CARD or IMMUNOTHERAPY ALERT CARD at check-in to the Emergency Department and triage nurse. ? ?Should you have questions after your visit or need to cancel or reschedule your appointment, please contact [facility] [phone]  and follow the prompts.  Office hours are 8:00 a.m. to 4:30 p.m. Monday - Friday. Please note that voicemails left after 4:00 p.m. may not be returned until the following business day.  We are closed weekends and major holidays. You have access to a nurse at all times for urgent questions. Please call the main number to the clinic [phone] and follow the prompts. ? ?For any non-urgent questions, you may also contact your provider using MyChart. We now offer e-Visits for anyone 18 and older to request care online for non-urgent symptoms. For details visit [email or web address]. ?  ?Also download the MyChart app! Go to the app store, search "MyChart", open the app, select [facility], and log in with your MyChart username and password. ? ?Due to Covid, a mask is required upon entering the hospital/clinic. If you do not have a mask, one will be given to you upon arrival. For doctor visits, patients may have 1 support person aged 18 or older with them. For treatment visits, patients cannot have anyone with them due to current Covid guidelines and our immunocompromised population.  ?

## 2021-06-09 NOTE — Progress Notes (Signed)
[place], [place] [address]   CLINIC:  Medical Oncology/Hematology  PCP:  Patient, No Pcp Per (Inactive) None None   REASON FOR VISIT:  Follow-up for metastatic colon cancer to liver  PRIOR THERAPY: Sigmoid colectomy and end colostomy on 04/29/2020  NGS Results: Foundation 1 KRAS wildtype, MS--stable, TMB 4 Muts/Mb  CURRENT THERAPY: FOLFOX, Avastin & Aloxi every 2 weeks  BRIEF ONCOLOGIC HISTORY:  Oncology History  Metastatic colon cancer to liver ([facility])  05/16/2020 Initial Diagnosis   Metastatic colon cancer to liver ([facility])   06/03/2020 Genetic Testing   Foundation One:     06/04/2020 -  Chemotherapy   Patient is on Treatment Plan : COLORECTAL FOLFOX + Bevacizumab q14d       CANCER STAGING:  Cancer Staging  No matching staging information was found for the patient.  INTERVAL HISTORY:  [name], a 60 y.o. female, returns for routine follow-up and consideration for next cycle of chemotherapy. Susan Martin was last seen on 05/06/2021.  Due for cycle #27 of FOLFOX and bevacizumab today.   Overall, she tells me she has been feeling pretty well. She is taking 2 tablets of ativan TID for her anxiety. She is currently taking potassium. She is taking gabapentin once daily in the morning, and her neuropathy has improved. She reports occasional mouth sores which are helped with baking soda and salt wash. She denies hematuria, hematochezia, and nosebleeds, and her appetite has improved.   Overall, she feels ready for next cycle of chemo today.   REVIEW OF SYSTEMS:  Review of Systems  Constitutional:  Positive for fatigue (75%). Negative for appetite change.  HENT:   Positive for mouth sores (occasional). Negative for nosebleeds.   Gastrointestinal:  Positive for diarrhea. Negative for blood in stool.  Genitourinary:  Negative for hematuria.   Neurological:  Numbness: improved.  Psychiatric/Behavioral:  Positive for sleep disturbance.  The patient is nervous/anxious.   All other systems reviewed and are negative.  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 Delton Coombes   04-29-2020 [facility] surgery for perforated colon s/p sigmoid colectomy w/ colostomy; dx  Stage IV colon cancer mets to liver   Anxiety    Depression    Hypertension    followed by dr Glo Herring and oncology  (05-27-2020 per pt does not have pcp yet)   IDA (iron deficiency anemia)    Pulmonary nodule, right    Past Surgical History:  Procedure Laterality Date   ABDOMINAL HYSTERECTOMY  03-31-2016   @[facility]    W/  BILATERAL SALPINOOPHORECTOMY    APPLICATION OF WOUND VAC N/A 04/29/2020   Procedure: APPLICATION OF WOUND VAC;  Surgeon: Mickeal Skinner, MD;  Location: [facility];  Service: General;  Laterality: N/A;   ENDOMETRIAL ABLATION     LAPAROTOMY N/A 04/29/2020   Procedure: EXPLORATORY LAPAROTOMY;  Surgeon: Mickeal Skinner, MD;  Location: [facility];  Service: General;  Laterality: N/A;   PARTIAL COLECTOMY N/A 04/29/2020   Procedure: PARTIAL COLECTOMY WITH END COLOSTOMY;  Surgeon: Kieth Brightly Arta Bruce, MD;  Location: [facility];  Service: General;  Laterality: N/A;   PORTACATH PLACEMENT Right 05/28/2020   Procedure: INSERTION PORT-A-CATH;  Surgeon: Kinsinger, Arta Bruce, MD;  Location: [facility];  Service: General;  Laterality: Right;   SALPINGOOPHORECTOMY Left 03/31/2016   Procedure: LEFT SALPINGO OOPHORECTOMY WITH FROZEN SECTION,  ABDOMINAL HYSTERECTOMY WITH BILATERAL SALPINGO-OOPHORECTOMY;  Surgeon:  Jonnie Kind, MD;  Location: [facility];  Service: Gynecology;  Laterality: Left;    SOCIAL HISTORY:  Social History   Socioeconomic History   Marital status: Divorced    Spouse name: Not on file   Number of children: Not on file   Years of education: Not on file   Highest education level: Not on file  Occupational History   Not on file   Tobacco Use   Smoking status: Former    Packs/day: 0.50    Years: 5.00    Pack years: 2.50    Types: Cigarettes    Quit date: 03/28/2015    Years since quitting: 6.2   Smokeless tobacco: Never  Vaping Use   Vaping Use: Never used  Substance and Sexual Activity   Alcohol use: No   Drug use: Never   Sexual activity: Yes    Partners: Male    Birth control/protection: Surgical  Other Topics Concern   Not on file  Social History Narrative   Not on file   Social Determinants of Health   Financial Resource Strain: Not on file  Food Insecurity: Not on file  Transportation Needs: Not on file  Physical Activity: Not on file  Stress: Not on file  Social Connections: Not on file  Intimate Partner Violence: Not on file    FAMILY HISTORY:  Family History  Problem Relation Age of Onset   Hypertension Mother    Diabetes Mother    Cirrhosis Father     CURRENT MEDICATIONS:  Current Outpatient Medications  Medication Sig Dispense Refill   amoxicillin (AMOXIL) 500 MG capsule Take 500 mg by mouth 3 (three) times daily.     BEVACIZUMAB IV Inject 5 mg/kg into the vein every 14 (fourteen) days.     fluorouracil CALGB 32671 in sodium chloride 0.9 % 150 mL Inject 2,400 mg/m2 into the vein over 48 hr.     gabapentin (NEURONTIN) 100 MG capsule Take 1 capsule (100 mg total) by mouth 3 (three) times daily. 90 capsule 3   hydrOXYzine (VISTARIL) 25 MG capsule Take 1 capsule (25 mg total) by mouth 3 (three) times daily as needed. Prn anxiety 60 capsule 2   LEUCOVORIN CALCIUM IV Inject 400 mg/m2 into the vein every 21 ( twenty-one) days.     lidocaine-prilocaine (EMLA) cream Apply small amount to port a cath site and cover with plastic wrap 1 hour prior to chemotherapy appointments 30 g 3   LORazepam (ATIVAN) 0.5 MG tablet Take 1 tablet (0.5 mg total) by mouth every 8 (eight) hours as needed for anxiety. 90 tablet 0   nystatin (MYCOSTATIN) 100000 UNIT/ML suspension Take by mouth.     PARoxetine  (PAXIL) 40 MG tablet Take 1 tablet (40 mg total) by mouth every morning. 90 tablet 3   polyethylene glycol (MIRALAX / GLYCOLAX) packet Take 17 g by mouth every other day.     potassium chloride (KLOR-CON) 10 MEQ tablet Take 2 tablets (20 mEq total) by mouth daily. 60 tablet 6   prochlorperazine (COMPAZINE) 10 MG tablet Take 1 tablet (10 mg total) by mouth every 6 (six) hours as needed (Nausea or vomiting). 30 tablet 1   triamterene-hydrochlorothiazide (MAXZIDE-25) 37.5-25 MG tablet Take 1 tablet by mouth daily. 30 tablet 3   No current facility-administered medications for this visit.    ALLERGIES:  No Known Allergies  PHYSICAL EXAM:  Performance status (ECOG): 1 - Symptomatic but completely ambulatory  There were no vitals filed for this visit. Wt  Readings from Last 3 Encounters:  05/27/21 163 lb 6.4 oz (74.1 kg)  05/06/21 162 lb 3.2 oz (73.6 kg)  05/06/21 161 lb 9.6 oz (73.3 kg)   Physical Exam Vitals reviewed.  Constitutional:      Appearance: Normal appearance.  Cardiovascular:     Rate and Rhythm: Normal rate and regular rhythm.     Pulses: Normal pulses.     Heart sounds: Normal heart sounds.  Pulmonary:     Effort: Pulmonary effort is normal.     Breath sounds: Normal breath sounds.  Neurological:     General: No focal deficit present.     Mental Status: She is alert and oriented to person, place, and time.  Psychiatric:        Mood and Affect: Mood normal.        Behavior: Behavior normal.    LABORATORY DATA:  I have reviewed the labs as listed.  CBC Latest Ref Rng & Units 05/27/2021 05/06/2021 04/22/2021  WBC 4.0 - 10.5 K/uL 3.8(L) 4.9 6.3  Hemoglobin 12.0 - 15.0 g/dL 14.1 14.8 14.5  Hematocrit 36.0 - 46.0 % 41.3 42.8 42.1  Platelets 150 - 400 K/uL 133(L) 143(L) 128(L)   CMP Latest Ref Rng & Units 05/27/2021 05/06/2021 04/22/2021  Glucose 70 - 99 mg/dL 112(H) 124(H) 118(H)  BUN 8 - 23 mg/dL 12 12 11   Creatinine 0.44 - 1.00 mg/dL 1.16(H) 1.01(H) 0.83  Sodium  135 - 145 mmol/L 134(L) 132(L) 135  Potassium 3.5 - 5.1 mmol/L 3.3(L) 2.8(L) 3.3(L)  Chloride 98 - 111 mmol/L 99 96(L) 99  CO2 22 - 32 mmol/L 26 25 28   Calcium 8.9 - 10.3 mg/dL 9.2 9.1 9.0  Total Protein 6.5 - 8.1 g/dL 7.5 8.3(H) 7.7  Total Bilirubin 0.3 - 1.2 mg/dL 0.7 1.0 0.8  Alkaline Phos 38 - 126 U/L 96 97 96  AST 15 - 41 U/L 35 33 26  ALT 0 - 44 U/L 31 30 26     DIAGNOSTIC IMAGING:  I have independently reviewed the scans and discussed with the patient. No results found.   ASSESSMENT:  1.  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 -CT chest on 05/02/2020 with no evidence of pulmonary metastatic disease. -CTAP on 05/07/2020 with multiple lesions throughout the liver, largest in the right lobe measuring 3.9 cm, lateral segment left lobe measuring 2.6 cm and inferior right lobe confluent lesion 4.2 x 3.7 cm.  No lymphadenopathy. -CEA on 05/02/2020-60.2. -FOLFOX and bevacizumab started on 06/04/2020. 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   2.  Iron deficiency anemia: -Feraheme on 04/30/2020 and 05/06/2020.   3.  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 -Smoked for 3 to 4 years and quit. -Mother had cancer, type unknown.  Maternal uncle had lung cancer.  Maternal aunt had lung cancer.   PLAN:  1.  Metastatic colon adenocarcinoma to liver, MS-stable: - CT CAP on 05/05/2021 showed multiple hypodense liver lesions and small pulmonary nodule stable. - Last CEA was 2.0. - Reviewed labs today which showed normal LFTs.  CBC was normal.  Mild thrombocytopenia stable.  Creatinine is mildly elevated.  Increase water intake. - Treatment today and in 2 weeks.  RTC 4 weeks  for follow-up.   2.  Iron deficiency anemia: - Hemoglobin  improved to 13.7.  No parenteral iron therapy.   3.  Severe hypokalemia: - Continue potassium 20 mEq daily.  Potassium today 3.3.   4.  Peripheral neuropathy: - Continue gabapentin 100 mg in the mornings.   5.  Anxiety: - She is taking Ativan 0.5 mg every 8 hours as needed which is not helping. - Will increase Ativan to 1 mg every 8 hours as needed. - Continue Paxil 40 mg daily.   Orders placed this encounter:  No orders of the defined types were placed in this encounter.    Derek Jack, MD [facility] [place] [phone]   I, Thana Ates, am acting as a scribe for Dr. Derek Jack.  I, Derek Jack MD, have reviewed the above documentation for accuracy and completeness, and I agree with the above.

## 2021-06-10 ENCOUNTER — Inpatient Hospital Stay (HOSPITAL_COMMUNITY): Payer: Medicaid Other

## 2021-06-10 ENCOUNTER — Other Ambulatory Visit: Payer: Self-pay

## 2021-06-10 ENCOUNTER — Encounter (HOSPITAL_COMMUNITY): Payer: Self-pay | Admitting: Hematology

## 2021-06-10 ENCOUNTER — Inpatient Hospital Stay (HOSPITAL_BASED_OUTPATIENT_CLINIC_OR_DEPARTMENT_OTHER): Payer: Medicaid Other | Admitting: Hematology

## 2021-06-10 VITALS — BP 152/90 | HR 73 | Temp 97.9°F | Resp 18 | Ht 64.0 in | Wt 161.1 lb

## 2021-06-10 VITALS — BP 163/98 | HR 75 | Temp 98.0°F | Resp 18

## 2021-06-10 DIAGNOSIS — C189 Malignant neoplasm of colon, unspecified: Secondary | ICD-10-CM | POA: Diagnosis not present

## 2021-06-10 DIAGNOSIS — C787 Secondary malignant neoplasm of liver and intrahepatic bile duct: Secondary | ICD-10-CM

## 2021-06-10 DIAGNOSIS — F419 Anxiety disorder, unspecified: Secondary | ICD-10-CM

## 2021-06-10 DIAGNOSIS — Z5111 Encounter for antineoplastic chemotherapy: Secondary | ICD-10-CM | POA: Diagnosis not present

## 2021-06-10 LAB — COMPREHENSIVE METABOLIC PANEL
ALT: 33 U/L (ref 0–44)
AST: 39 U/L (ref 15–41)
Albumin: 3.7 g/dL (ref 3.5–5.0)
Alkaline Phosphatase: 80 U/L (ref 38–126)
Anion gap: 9 (ref 5–15)
BUN: 13 mg/dL (ref 8–23)
CO2: 26 mmol/L (ref 22–32)
Calcium: 9.3 mg/dL (ref 8.9–10.3)
Chloride: 98 mmol/L (ref 98–111)
Creatinine, Ser: 1.03 mg/dL — ABNORMAL HIGH (ref 0.44–1.00)
GFR, Estimated: >60 mL/min (ref >60)
Glucose, Bld: 140 mg/dL — ABNORMAL HIGH (ref 70–99)
Potassium: 3.3 mmol/L — ABNORMAL LOW (ref 3.5–5.1)
Sodium: 133 mmol/L — ABNORMAL LOW (ref 135–145)
Total Bilirubin: 0.6 mg/dL (ref 0.3–1.2)
Total Protein: 7.2 g/dL (ref 6.5–8.1)

## 2021-06-10 LAB — CBC WITH DIFFERENTIAL/PLATELET
Abs Immature Granulocytes: 0.02 10*3/uL (ref 0.00–0.07)
Basophils Absolute: 0 10*3/uL (ref 0.0–0.1)
Basophils Relative: 1 %
Eosinophils Absolute: 0.1 10*3/uL (ref 0.0–0.5)
Eosinophils Relative: 1 %
HCT: 39.9 % (ref 36.0–46.0)
Hemoglobin: 13.7 g/dL (ref 12.0–15.0)
Immature Granulocytes: 1 %
Lymphocytes Relative: 30 %
Lymphs Abs: 1.3 10*3/uL (ref 0.7–4.0)
MCH: 31.3 pg (ref 26.0–34.0)
MCHC: 34.3 g/dL (ref 30.0–36.0)
MCV: 91.1 fL (ref 80.0–100.0)
Monocytes Absolute: 0.3 10*3/uL (ref 0.1–1.0)
Monocytes Relative: 7 %
Neutro Abs: 2.7 10*3/uL (ref 1.7–7.7)
Neutrophils Relative %: 60 %
Platelets: 115 10*3/uL — ABNORMAL LOW (ref 150–400)
RBC: 4.38 MIL/uL (ref 3.87–5.11)
RDW: 14.7 % (ref 11.5–15.5)
WBC: 4.4 10*3/uL (ref 4.0–10.5)
nRBC: 0 % (ref 0.0–0.2)

## 2021-06-10 LAB — URINALYSIS, DIPSTICK ONLY
Bilirubin Urine: NEGATIVE
Glucose, UA: NEGATIVE mg/dL
Ketones, ur: NEGATIVE mg/dL
Nitrite: NEGATIVE
Protein, ur: NEGATIVE mg/dL
Specific Gravity, Urine: 1.01 (ref 1.005–1.030)
pH: 7 (ref 5.0–8.0)

## 2021-06-10 LAB — MAGNESIUM: Magnesium: 1.9 mg/dL (ref 1.7–2.4)

## 2021-06-10 MED ORDER — SODIUM CHLORIDE 0.9 % IV SOLN
10.0000 mg | Freq: Once | INTRAVENOUS | Status: AC
  Administered 2021-06-10: 10 mg via INTRAVENOUS
  Filled 2021-06-10: qty 10

## 2021-06-10 MED ORDER — SODIUM CHLORIDE 0.9 % IV SOLN: Freq: Once | INTRAVENOUS | Status: AC

## 2021-06-10 MED ORDER — PALONOSETRON HCL INJECTION 0.25 MG/5ML
0.2500 mg | Freq: Once | INTRAVENOUS | Status: AC
  Administered 2021-06-10: 0.25 mg via INTRAVENOUS
  Filled 2021-06-10: qty 5

## 2021-06-10 MED ORDER — LORAZEPAM 1 MG PO TABS: 1.0000 mg | ORAL_TABLET | Freq: Three times a day (TID) | ORAL | 1 refills | Status: DC | PRN

## 2021-06-10 MED ORDER — FLUOROURACIL CHEMO INJECTION 2.5 GM/50ML
320.0000 mg/m2 | Freq: Once | INTRAVENOUS | Status: AC
  Administered 2021-06-10: 550 mg via INTRAVENOUS
  Filled 2021-06-10: qty 11

## 2021-06-10 MED ORDER — SODIUM CHLORIDE 0.9 % IV SOLN
5.0000 mg/kg | Freq: Once | INTRAVENOUS | Status: AC
  Administered 2021-06-10: 400 mg via INTRAVENOUS
  Filled 2021-06-10: qty 16

## 2021-06-10 MED ORDER — SODIUM CHLORIDE 0.9 % IV SOLN
1920.0000 mg/m2 | INTRAVENOUS | Status: DC
  Administered 2021-06-10: 3250 mg via INTRAVENOUS
  Filled 2021-06-10: qty 65

## 2021-06-10 MED ORDER — SODIUM CHLORIDE 0.9 % IV SOLN
400.0000 mg/m2 | Freq: Once | INTRAVENOUS | Status: AC
  Administered 2021-06-10: 680 mg via INTRAVENOUS
  Filled 2021-06-10: qty 34

## 2021-06-10 MED ORDER — SODIUM CHLORIDE 0.9% FLUSH: 10.0000 mL | INTRAVENOUS | Status: DC | PRN

## 2021-06-10 NOTE — Progress Notes (Signed)
Patient tolerated chemotherapy with no complaints voiced. Side effects with management reviewed understanding verbalized. Port site clean and dry with no bruising or swelling noted at site. Good blood return noted before and after administration of chemotherapy. Chemo pump connected with no alarms noted. Patient left in satisfactory condition with VSS and no s/s of distress noted.  °

## 2021-06-10 NOTE — Progress Notes (Signed)
Patient has been examined, vital signs and labs have been reviewed by Dr. Katragadda. ANC, Creatinine, LFTs, hemoglobin, and platelets are within treatment parameters per Dr. Katragadda. Patient may proceed with treatment per M.D.   

## 2021-06-10 NOTE — Patient Instructions (Signed)
[facility] at [facility] Discharge Instructions   You were seen and examined today by Dr. Delton Coombes. We will proceed with treatment today. Continue potassium 2 pills daily. Return as scheduled for treatment. Dr. Raliegh Ip will see you back in 4 weeks.    Thank you for choosing [facility] at [facility] to provide your oncology and hematology care.  To afford each patient quality time with our provider, please arrive at least 15 minutes before your scheduled appointment time.   If you have a lab appointment with the [facility] please come in thru the Main Entrance and check in at the main information desk.  You need to re-schedule your appointment should you arrive 10 or more minutes late.  We strive to give you quality time with our providers, and arriving late affects you and other patients whose appointments are after yours.  Also, if you no show three or more times for appointments you may be dismissed from the clinic at the providers discretion.     Again, thank you for choosing [facility].  Our hope is that these requests will decrease the amount of time that you wait before being seen by our physicians.       _____________________________________________________________  Should you have questions after your visit to [facility], please contact our office at [phone] and follow the prompts.  Our office hours are 8:00 a.m. and 4:30 p.m. Monday - Friday.  Please note that voicemails left after 4:00 p.m. may not be returned until the following business day.  We are closed weekends and major holidays.  You do have access to a nurse 24-7, just call the main number to the clinic [phone] and do not press any options, hold on the line and a nurse will answer the phone.    For prescription refill requests, have your pharmacy contact our office and allow 72 hours.    Due to Covid, you will need to wear a mask upon  entering the hospital. If you do not have a mask, a mask will be given to you at the Main Entrance upon arrival. For doctor visits, patients may have 1 support person age 25 or older with them. For treatment visits, patients can not have anyone with them due to social distancing guidelines and our immunocompromised population.

## 2021-06-10 NOTE — Patient Instructions (Signed)
[facility]  Discharge Instructions: Thank you for choosing [facility] to provide your oncology and hematology care.  If you have a lab appointment with the [facility], please come in thru the Main Entrance and check in at the main information desk.  Wear comfortable clothing and clothing appropriate for easy access to any Portacath or PICC line.   We strive to give you quality time with your provider. You may need to reschedule your appointment if you arrive late (15 or more minutes).  Arriving late affects you and other patients whose appointments are after yours.  Also, if you miss three or more appointments without notifying the office, you may be dismissed from the clinic at the providers discretion.      For prescription refill requests, have your pharmacy contact our office and allow 72 hours for refills to be completed.    Today you received the following chemotherapy and/or immunotherapy agents Bevacizumab, lecovorin and 5FU, your pump was connected, return as scheduled.   To help prevent nausea and vomiting after your treatment, we encourage you to take your nausea medication as directed.  BELOW ARE SYMPTOMS THAT SHOULD BE REPORTED IMMEDIATELY: *FEVER GREATER THAN 100.4 F (38 C) OR HIGHER *CHILLS OR SWEATING *NAUSEA AND VOMITING THAT IS NOT CONTROLLED WITH YOUR NAUSEA MEDICATION *UNUSUAL SHORTNESS OF BREATH *UNUSUAL BRUISING OR BLEEDING *URINARY PROBLEMS (pain or burning when urinating, or frequent urination) *BOWEL PROBLEMS (unusual diarrhea, constipation, pain near the anus) TENDERNESS IN MOUTH AND THROAT WITH OR WITHOUT PRESENCE OF ULCERS (sore throat, sores in mouth, or a toothache) UNUSUAL RASH, SWELLING OR PAIN  UNUSUAL VAGINAL DISCHARGE OR ITCHING   Items with * indicate a potential emergency and should be followed up as soon as possible or go to the Emergency Department if any problems should occur.  Please show the CHEMOTHERAPY ALERT  CARD or IMMUNOTHERAPY ALERT CARD at check-in to the Emergency Department and triage nurse.  Should you have questions after your visit or need to cancel or reschedule your appointment, please contact [facility] [phone]  and follow the prompts.  Office hours are 8:00 a.m. to 4:30 p.m. Monday - Friday. Please note that voicemails left after 4:00 p.m. may not be returned until the following business day.  We are closed weekends and major holidays. You have access to a nurse at all times for urgent questions. Please call the main number to the clinic [phone] and follow the prompts.  For any non-urgent questions, you may also contact your provider using MyChart. We now offer e-Visits for anyone 67 and older to request care online for non-urgent symptoms. For details visit [email or web address].   Also download the MyChart app! Go to the app store, search "MyChart", open the app, select [facility], and log in with your MyChart username and password.  Due to Covid, a mask is required upon entering the hospital/clinic. If you do not have a mask, one will be given to you upon arrival. For doctor visits, patients may have 1 support person aged 55 or older with them. For treatment visits, patients cannot have anyone with them due to current Covid guidelines and our immunocompromised population.

## 2021-06-11 LAB — CEA: CEA: 2.1 ng/mL (ref 0.0–4.7)

## 2021-06-12 ENCOUNTER — Inpatient Hospital Stay (HOSPITAL_COMMUNITY): Payer: Medicaid Other

## 2021-06-12 ENCOUNTER — Other Ambulatory Visit: Payer: Self-pay

## 2021-06-12 ENCOUNTER — Encounter (HOSPITAL_COMMUNITY): Payer: Self-pay

## 2021-06-12 VITALS — BP 150/87 | HR 78 | Temp 98.5°F | Resp 18

## 2021-06-12 DIAGNOSIS — C189 Malignant neoplasm of colon, unspecified: Secondary | ICD-10-CM

## 2021-06-12 DIAGNOSIS — Z5111 Encounter for antineoplastic chemotherapy: Secondary | ICD-10-CM | POA: Diagnosis not present

## 2021-06-12 MED ORDER — SODIUM CHLORIDE 0.9% FLUSH
10.0000 mL | INTRAVENOUS | Status: DC | PRN
  Administered 2021-06-12: 10 mL

## 2021-06-12 MED ORDER — HEPARIN SOD (PORK) LOCK FLUSH 100 UNIT/ML IV SOLN
500.0000 [IU] | Freq: Once | INTRAVENOUS | Status: AC | PRN
  Administered 2021-06-12: 500 [IU]

## 2021-06-12 NOTE — Patient Instructions (Signed)
[facility]  Discharge Instructions: Thank you for choosing [facility] to provide your oncology and hematology care.  If you have a lab appointment with the Cancer Center, please come in thru the Main Entrance and check in at the main information desk.  Wear comfortable clothing and clothing appropriate for easy access to any Portacath or PICC line.   We strive to give you quality time with your provider. You may need to reschedule your appointment if you arrive late (15 or more minutes).  Arriving late affects you and other patients whose appointments are after yours.  Also, if you miss three or more appointments without notifying the office, you may be dismissed from the clinic at the provider's discretion.      For prescription refill requests, have your pharmacy contact our office and allow 72 hours for refills to be completed.        To help prevent nausea and vomiting after your treatment, we encourage you to take your nausea medication as directed.  BELOW ARE SYMPTOMS THAT SHOULD BE REPORTED IMMEDIATELY: *FEVER GREATER THAN 100.4 F (38 C) OR HIGHER *CHILLS OR SWEATING *NAUSEA AND VOMITING THAT IS NOT CONTROLLED WITH YOUR NAUSEA MEDICATION *UNUSUAL SHORTNESS OF BREATH *UNUSUAL BRUISING OR BLEEDING *URINARY PROBLEMS (pain or burning when urinating, or frequent urination) *BOWEL PROBLEMS (unusual diarrhea, constipation, pain near the anus) TENDERNESS IN MOUTH AND THROAT WITH OR WITHOUT PRESENCE OF ULCERS (sore throat, sores in mouth, or a toothache) UNUSUAL RASH, SWELLING OR PAIN  UNUSUAL VAGINAL DISCHARGE OR ITCHING   Items with * indicate a potential emergency and should be followed up as soon as possible or go to the Emergency Department if any problems should occur.  Please show the CHEMOTHERAPY ALERT CARD or IMMUNOTHERAPY ALERT CARD at check-in to the Emergency Department and triage nurse.  Should you have questions after your visit or need to cancel  or reschedule your appointment, please contact [facility] [phone]  and follow the prompts.  Office hours are 8:00 a.m. to 4:30 p.m. Monday - Friday. Please note that voicemails left after 4:00 p.m. may not be returned until the following business day.  We are closed weekends and major holidays. You have access to a nurse at all times for urgent questions. Please call the main number to the clinic [phone] and follow the prompts.  For any non-urgent questions, you may also contact your provider using MyChart. We now offer e-Visits for anyone 18 and older to request care online for non-urgent symptoms. For details visit [email or web address].   Also download the MyChart app! Go to the app store, search "MyChart", open the app, select [facility], and log in with your MyChart username and password.  Due to Covid, a mask is required upon entering the hospital/clinic. If you do not have a mask, one will be given to you upon arrival. For doctor visits, patients may have 1 support person aged 18 or older with them. For treatment visits, patients cannot have anyone with them due to current Covid guidelines and our immunocompromised population.  

## 2021-06-12 NOTE — Progress Notes (Signed)
Patient for chemotherapy pump disconnect with no complaints voiced.  Patients port flushed without difficulty.  Good blood return noted with no bruising or swelling noted at site.  Band aid applied.  VSS with discharge and left ambulatory with no s/s of distress noted.   

## 2021-06-24 ENCOUNTER — Encounter (HOSPITAL_COMMUNITY): Payer: Self-pay

## 2021-06-24 ENCOUNTER — Other Ambulatory Visit: Payer: Self-pay

## 2021-06-24 ENCOUNTER — Inpatient Hospital Stay (HOSPITAL_COMMUNITY): Payer: Medicaid Other

## 2021-06-24 VITALS — BP 154/87 | HR 88 | Temp 96.8°F | Resp 17

## 2021-06-24 DIAGNOSIS — E876 Hypokalemia: Secondary | ICD-10-CM

## 2021-06-24 DIAGNOSIS — C189 Malignant neoplasm of colon, unspecified: Secondary | ICD-10-CM

## 2021-06-24 DIAGNOSIS — C787 Secondary malignant neoplasm of liver and intrahepatic bile duct: Secondary | ICD-10-CM

## 2021-06-24 DIAGNOSIS — Z5111 Encounter for antineoplastic chemotherapy: Secondary | ICD-10-CM | POA: Diagnosis not present

## 2021-06-24 LAB — COMPREHENSIVE METABOLIC PANEL
ALT: 30 U/L (ref 0–44)
AST: 34 U/L (ref 15–41)
Albumin: 3.8 g/dL (ref 3.5–5.0)
Alkaline Phosphatase: 90 U/L (ref 38–126)
Anion gap: 9 (ref 5–15)
BUN: 11 mg/dL (ref 8–23)
CO2: 26 mmol/L (ref 22–32)
Calcium: 8.8 mg/dL — ABNORMAL LOW (ref 8.9–10.3)
Chloride: 99 mmol/L (ref 98–111)
Creatinine, Ser: 0.85 mg/dL (ref 0.44–1.00)
GFR, Estimated: >60 mL/min (ref >60)
Glucose, Bld: 134 mg/dL — ABNORMAL HIGH (ref 70–99)
Potassium: 3 mmol/L — ABNORMAL LOW (ref 3.5–5.1)
Sodium: 134 mmol/L — ABNORMAL LOW (ref 135–145)
Total Bilirubin: 0.6 mg/dL (ref 0.3–1.2)
Total Protein: 7.3 g/dL (ref 6.5–8.1)

## 2021-06-24 LAB — CBC WITH DIFFERENTIAL/PLATELET
Abs Immature Granulocytes: 0.02 10*3/uL (ref 0.00–0.07)
Basophils Absolute: 0 10*3/uL (ref 0.0–0.1)
Basophils Relative: 1 %
Eosinophils Absolute: 0.1 10*3/uL (ref 0.0–0.5)
Eosinophils Relative: 2 %
HCT: 40.8 % (ref 36.0–46.0)
Hemoglobin: 14.3 g/dL (ref 12.0–15.0)
Immature Granulocytes: 0 %
Lymphocytes Relative: 27 %
Lymphs Abs: 1.7 10*3/uL (ref 0.7–4.0)
MCH: 32.3 pg (ref 26.0–34.0)
MCHC: 35 g/dL (ref 30.0–36.0)
MCV: 92.1 fL (ref 80.0–100.0)
Monocytes Absolute: 0.7 10*3/uL (ref 0.1–1.0)
Monocytes Relative: 11 %
Neutro Abs: 3.7 10*3/uL (ref 1.7–7.7)
Neutrophils Relative %: 59 %
Platelets: 139 10*3/uL — ABNORMAL LOW (ref 150–400)
RBC: 4.43 MIL/uL (ref 3.87–5.11)
RDW: 15.9 % — ABNORMAL HIGH (ref 11.5–15.5)
WBC: 6.2 10*3/uL (ref 4.0–10.5)
nRBC: 0 % (ref 0.0–0.2)

## 2021-06-24 LAB — MAGNESIUM: Magnesium: 2.1 mg/dL (ref 1.7–2.4)

## 2021-06-24 MED ORDER — POTASSIUM CHLORIDE CRYS ER 10 MEQ PO TBCR: 20.0000 meq | EXTENDED_RELEASE_TABLET | Freq: Every day | ORAL | 6 refills | Status: DC

## 2021-06-24 MED ORDER — SODIUM CHLORIDE 0.9 % IV SOLN: Freq: Once | INTRAVENOUS | Status: AC

## 2021-06-24 MED ORDER — POTASSIUM CHLORIDE CRYS ER 20 MEQ PO TBCR
40.0000 meq | EXTENDED_RELEASE_TABLET | Freq: Once | ORAL | Status: AC
  Administered 2021-06-24: 10:00:00 40 meq via ORAL
  Filled 2021-06-24: qty 2

## 2021-06-24 MED ORDER — SODIUM CHLORIDE 0.9% FLUSH: 10.0000 mL | INTRAVENOUS | Status: DC | PRN

## 2021-06-24 MED ORDER — FLUOROURACIL CHEMO INJECTION 2.5 GM/50ML
320.0000 mg/m2 | Freq: Once | INTRAVENOUS | Status: AC
  Administered 2021-06-24: 12:00:00 550 mg via INTRAVENOUS
  Filled 2021-06-24: qty 11

## 2021-06-24 MED ORDER — SODIUM CHLORIDE 0.9 % IV SOLN
5.0000 mg/kg | Freq: Once | INTRAVENOUS | Status: AC
  Administered 2021-06-24: 10:00:00 400 mg via INTRAVENOUS
  Filled 2021-06-24: qty 16

## 2021-06-24 MED ORDER — SODIUM CHLORIDE 0.9 % IV SOLN
1920.0000 mg/m2 | INTRAVENOUS | Status: DC
  Administered 2021-06-24: 12:00:00 3250 mg via INTRAVENOUS
  Filled 2021-06-24: qty 65

## 2021-06-24 MED ORDER — SODIUM CHLORIDE 0.9 % IV SOLN
400.0000 mg/m2 | Freq: Once | INTRAVENOUS | Status: AC
  Administered 2021-06-24: 11:00:00 680 mg via INTRAVENOUS
  Filled 2021-06-24: qty 34

## 2021-06-24 MED ORDER — SODIUM CHLORIDE 0.9 % IV SOLN
10.0000 mg | Freq: Once | INTRAVENOUS | Status: AC
  Administered 2021-06-24: 10:00:00 10 mg via INTRAVENOUS
  Filled 2021-06-24: qty 10

## 2021-06-24 MED ORDER — PALONOSETRON HCL INJECTION 0.25 MG/5ML
0.2500 mg | Freq: Once | INTRAVENOUS | Status: AC
  Administered 2021-06-24: 10:00:00 0.25 mg via INTRAVENOUS
  Filled 2021-06-24: qty 5

## 2021-06-24 NOTE — Progress Notes (Signed)
Potassium 3.0 today.  Potassium 40 meq by mouth per standing orders Dr. Delton Coombes.   Patient tolerated chemotherapy with no complaints voiced.  Side effects with management reviewed with understanding verbalized.  Port site clean and dry with no bruising or swelling noted at site.  Good blood return noted before and after administration of chemotherapy.  Chemotherapy pump with no alarms noted.  Dressing intact.  Patient left in satisfactory condition with VSS and no s/s of distress noted.

## 2021-06-24 NOTE — Patient Instructions (Signed)
[facility]  Discharge Instructions: Thank you for choosing [facility] to provide your oncology and hematology care.  If you have a lab appointment with the [facility], please come in thru the Main Entrance and check in at the main information desk.  Wear comfortable clothing and clothing appropriate for easy access to any Portacath or PICC line.   We strive to give you quality time with your provider. You may need to reschedule your appointment if you arrive late (15 or more minutes).  Arriving late affects you and other patients whose appointments are after yours.  Also, if you miss three or more appointments without notifying the office, you may be dismissed from the clinic at the providers discretion.      For prescription refill requests, have your pharmacy contact our office and allow 72 hours for refills to be completed.    Today you received the following chemotherapy and/or immunotherapy agents MVASI, Leucovorin, and Adruicil.    To help prevent nausea and vomiting after your treatment, we encourage you to take your nausea medication as directed.  BELOW ARE SYMPTOMS THAT SHOULD BE REPORTED IMMEDIATELY: *FEVER GREATER THAN 100.4 F (38 C) OR HIGHER *CHILLS OR SWEATING *NAUSEA AND VOMITING THAT IS NOT CONTROLLED WITH YOUR NAUSEA MEDICATION *UNUSUAL SHORTNESS OF BREATH *UNUSUAL BRUISING OR BLEEDING *URINARY PROBLEMS (pain or burning when urinating, or frequent urination) *BOWEL PROBLEMS (unusual diarrhea, constipation, pain near the anus) TENDERNESS IN MOUTH AND THROAT WITH OR WITHOUT PRESENCE OF ULCERS (sore throat, sores in mouth, or a toothache) UNUSUAL RASH, SWELLING OR PAIN  UNUSUAL VAGINAL DISCHARGE OR ITCHING   Items with * indicate a potential emergency and should be followed up as soon as possible or go to the Emergency Department if any problems should occur.  Please show the CHEMOTHERAPY ALERT CARD or IMMUNOTHERAPY ALERT CARD at check-in to  the Emergency Department and triage nurse.  Should you have questions after your visit or need to cancel or reschedule your appointment, please contact [facility] [phone]  and follow the prompts.  Office hours are 8:00 a.m. to 4:30 p.m. Monday - Friday. Please note that voicemails left after 4:00 p.m. may not be returned until the following business day.  We are closed weekends and major holidays. You have access to a nurse at all times for urgent questions. Please call the main number to the clinic [phone] and follow the prompts.  For any non-urgent questions, you may also contact your provider using MyChart. We now offer e-Visits for anyone 78 and older to request care online for non-urgent symptoms. For details visit [email or web address].   Also download the MyChart app! Go to the app store, search "MyChart", open the app, select [facility], and log in with your MyChart username and password.  Due to Covid, a mask is required upon entering the hospital/clinic. If you do not have a mask, one will be given to you upon arrival. For doctor visits, patients may have 1 support person aged 110 or older with them. For treatment visits, patients cannot have anyone with them due to current Covid guidelines and our immunocompromised population.

## 2021-06-26 ENCOUNTER — Inpatient Hospital Stay (HOSPITAL_COMMUNITY): Payer: Medicaid Other

## 2021-06-26 ENCOUNTER — Other Ambulatory Visit: Payer: Self-pay

## 2021-06-26 VITALS — BP 162/89 | HR 83 | Temp 98.6°F | Resp 18

## 2021-06-26 DIAGNOSIS — Z5111 Encounter for antineoplastic chemotherapy: Secondary | ICD-10-CM | POA: Diagnosis not present

## 2021-06-26 DIAGNOSIS — C189 Malignant neoplasm of colon, unspecified: Secondary | ICD-10-CM

## 2021-06-26 DIAGNOSIS — C787 Secondary malignant neoplasm of liver and intrahepatic bile duct: Secondary | ICD-10-CM

## 2021-06-26 MED ORDER — SODIUM CHLORIDE 0.9% FLUSH
10.0000 mL | INTRAVENOUS | Status: DC | PRN
  Administered 2021-06-26: 13:00:00 10 mL

## 2021-06-26 MED ORDER — HEPARIN SOD (PORK) LOCK FLUSH 100 UNIT/ML IV SOLN
500.0000 [IU] | Freq: Once | INTRAVENOUS | Status: AC | PRN
  Administered 2021-06-26: 13:00:00 500 [IU]

## 2021-06-26 NOTE — Patient Instructions (Signed)
[facility]  Discharge Instructions: Thank you for choosing [facility] to provide your oncology and hematology care.  If you have a lab appointment with the [facility], please come in thru the Main Entrance and check in at the main information desk.  Wear comfortable clothing and clothing appropriate for easy access to any Portacath or PICC line.   We strive to give you quality time with your provider. You may need to reschedule your appointment if you arrive late (15 or more minutes).  Arriving late affects you and other patients whose appointments are after yours.  Also, if you miss three or more appointments without notifying the office, you may be dismissed from the clinic at the providers discretion.      For prescription refill requests, have your pharmacy contact our office and allow 72 hours for refills to be completed.    Today you received the following chemotherapy and/or immunotherapy agents 5FU pump disconnection.   BELOW ARE SYMPTOMS THAT SHOULD BE REPORTED IMMEDIATELY: *FEVER GREATER THAN 100.4 F (38 C) OR HIGHER *CHILLS OR SWEATING *NAUSEA AND VOMITING THAT IS NOT CONTROLLED WITH YOUR NAUSEA MEDICATION *UNUSUAL SHORTNESS OF BREATH *UNUSUAL BRUISING OR BLEEDING *URINARY PROBLEMS (pain or burning when urinating, or frequent urination) *BOWEL PROBLEMS (unusual diarrhea, constipation, pain near the anus) TENDERNESS IN MOUTH AND THROAT WITH OR WITHOUT PRESENCE OF ULCERS (sore throat, sores in mouth, or a toothache) UNUSUAL RASH, SWELLING OR PAIN  UNUSUAL VAGINAL DISCHARGE OR ITCHING   Items with * indicate a potential emergency and should be followed up as soon as possible or go to the Emergency Department if any problems should occur.  Please show the CHEMOTHERAPY ALERT CARD or IMMUNOTHERAPY ALERT CARD at check-in to the Emergency Department and triage nurse.  Should you have questions after your visit or need to cancel or reschedule your  appointment, please contact [facility] [phone]  and follow the prompts.  Office hours are 8:00 a.m. to 4:30 p.m. Monday - Friday. Please note that voicemails left after 4:00 p.m. may not be returned until the following business day.  We are closed weekends and major holidays. You have access to a nurse at all times for urgent questions. Please call the main number to the clinic [phone] and follow the prompts.  For any non-urgent questions, you may also contact your provider using MyChart. We now offer e-Visits for anyone 72 and older to request care online for non-urgent symptoms. For details visit [email or web address].   Also download the MyChart app! Go to the app store, search "MyChart", open the app, select [facility], and log in with your MyChart username and password.  Due to Covid, a mask is required upon entering the hospital/clinic. If you do not have a mask, one will be given to you upon arrival. For doctor visits, patients may have 1 support person aged 62 or older with them. For treatment visits, patients cannot have anyone with them due to current Covid guidelines and our immunocompromised population.

## 2021-06-26 NOTE — Progress Notes (Signed)
Pt presents today for 5FU pump disconnection per provider's order. Vital signs stable and pt voiced no new complaints at this time. Port flushed without difficulty with 10 ml of NS and 5 ml of heparin. Good blood return noted and no swelling or bruising noted at the site.   5FU pump removed today per MD orders. Tolerated infusion without adverse affects. Vital signs stable. No complaints at this time. Discharged from clinic ambulatory in stable condition. Alert and oriented x 3. F/U with [facility] as scheduled.

## 2021-07-08 ENCOUNTER — Other Ambulatory Visit: Payer: Self-pay

## 2021-07-08 ENCOUNTER — Inpatient Hospital Stay (HOSPITAL_COMMUNITY): Payer: Medicaid Other

## 2021-07-08 ENCOUNTER — Inpatient Hospital Stay (HOSPITAL_COMMUNITY): Payer: Medicaid Other | Attending: Hematology

## 2021-07-08 ENCOUNTER — Inpatient Hospital Stay (HOSPITAL_BASED_OUTPATIENT_CLINIC_OR_DEPARTMENT_OTHER): Payer: Medicaid Other | Admitting: Hematology

## 2021-07-08 VITALS — BP 157/82 | HR 71 | Temp 96.8°F | Resp 18 | Ht 65.25 in | Wt 160.6 lb

## 2021-07-08 DIAGNOSIS — C187 Malignant neoplasm of sigmoid colon: Secondary | ICD-10-CM | POA: Insufficient documentation

## 2021-07-08 DIAGNOSIS — C189 Malignant neoplasm of colon, unspecified: Secondary | ICD-10-CM | POA: Diagnosis not present

## 2021-07-08 DIAGNOSIS — Z87891 Personal history of nicotine dependence: Secondary | ICD-10-CM | POA: Insufficient documentation

## 2021-07-08 DIAGNOSIS — C787 Secondary malignant neoplasm of liver and intrahepatic bile duct: Secondary | ICD-10-CM | POA: Insufficient documentation

## 2021-07-08 DIAGNOSIS — F419 Anxiety disorder, unspecified: Secondary | ICD-10-CM | POA: Insufficient documentation

## 2021-07-08 DIAGNOSIS — G629 Polyneuropathy, unspecified: Secondary | ICD-10-CM | POA: Insufficient documentation

## 2021-07-08 DIAGNOSIS — Z5112 Encounter for antineoplastic immunotherapy: Secondary | ICD-10-CM | POA: Insufficient documentation

## 2021-07-08 DIAGNOSIS — E876 Hypokalemia: Secondary | ICD-10-CM | POA: Diagnosis not present

## 2021-07-08 DIAGNOSIS — Z79899 Other long term (current) drug therapy: Secondary | ICD-10-CM | POA: Insufficient documentation

## 2021-07-08 DIAGNOSIS — Z5111 Encounter for antineoplastic chemotherapy: Secondary | ICD-10-CM | POA: Insufficient documentation

## 2021-07-08 DIAGNOSIS — D509 Iron deficiency anemia, unspecified: Secondary | ICD-10-CM | POA: Diagnosis not present

## 2021-07-08 LAB — MAGNESIUM: Magnesium: 2.1 mg/dL (ref 1.7–2.4)

## 2021-07-08 LAB — CBC WITH DIFFERENTIAL/PLATELET
Abs Immature Granulocytes: 0.01 10*3/uL (ref 0.00–0.07)
Basophils Absolute: 0 10*3/uL (ref 0.0–0.1)
Basophils Relative: 0 %
Eosinophils Absolute: 0.1 10*3/uL (ref 0.0–0.5)
Eosinophils Relative: 2 %
HCT: 41.5 % (ref 36.0–46.0)
Hemoglobin: 14.3 g/dL (ref 12.0–15.0)
Immature Granulocytes: 0 %
Lymphocytes Relative: 35 %
Lymphs Abs: 1.5 10*3/uL (ref 0.7–4.0)
MCH: 32 pg (ref 26.0–34.0)
MCHC: 34.5 g/dL (ref 30.0–36.0)
MCV: 92.8 fL (ref 80.0–100.0)
Monocytes Absolute: 0.5 10*3/uL (ref 0.1–1.0)
Monocytes Relative: 10 %
Neutro Abs: 2.4 10*3/uL (ref 1.7–7.7)
Neutrophils Relative %: 53 %
Platelets: 119 10*3/uL — ABNORMAL LOW (ref 150–400)
RBC: 4.47 MIL/uL (ref 3.87–5.11)
RDW: 15.9 % — ABNORMAL HIGH (ref 11.5–15.5)
WBC: 4.5 10*3/uL (ref 4.0–10.5)
nRBC: 0 % (ref 0.0–0.2)

## 2021-07-08 LAB — COMPREHENSIVE METABOLIC PANEL
ALT: 39 U/L (ref 0–44)
AST: 38 U/L (ref 15–41)
Albumin: 3.7 g/dL (ref 3.5–5.0)
Alkaline Phosphatase: 90 U/L (ref 38–126)
Anion gap: 8 (ref 5–15)
BUN: 15 mg/dL (ref 8–23)
CO2: 27 mmol/L (ref 22–32)
Calcium: 9 mg/dL (ref 8.9–10.3)
Chloride: 102 mmol/L (ref 98–111)
Creatinine, Ser: 0.95 mg/dL (ref 0.44–1.00)
GFR, Estimated: >60 mL/min (ref >60)
Glucose, Bld: 125 mg/dL — ABNORMAL HIGH (ref 70–99)
Potassium: 3.4 mmol/L — ABNORMAL LOW (ref 3.5–5.1)
Sodium: 137 mmol/L (ref 135–145)
Total Bilirubin: 0.8 mg/dL (ref 0.3–1.2)
Total Protein: 7.3 g/dL (ref 6.5–8.1)

## 2021-07-08 MED ORDER — SODIUM CHLORIDE 0.9 % IV SOLN
5.0000 mg/kg | Freq: Once | INTRAVENOUS | Status: AC
  Administered 2021-07-08: 400 mg via INTRAVENOUS
  Filled 2021-07-08: qty 16

## 2021-07-08 MED ORDER — FLUOROURACIL CHEMO INJECTION 2.5 GM/50ML
320.0000 mg/m2 | Freq: Once | INTRAVENOUS | Status: AC
  Administered 2021-07-08: 550 mg via INTRAVENOUS
  Filled 2021-07-08: qty 11

## 2021-07-08 MED ORDER — SODIUM CHLORIDE 0.9 % IV SOLN: Freq: Once | INTRAVENOUS | Status: AC

## 2021-07-08 MED ORDER — SODIUM CHLORIDE 0.9 % IV SOLN
10.0000 mg | Freq: Once | INTRAVENOUS | Status: AC
  Administered 2021-07-08: 10 mg via INTRAVENOUS
  Filled 2021-07-08: qty 10

## 2021-07-08 MED ORDER — SODIUM CHLORIDE 0.9 % IV SOLN
400.0000 mg/m2 | Freq: Once | INTRAVENOUS | Status: AC
  Administered 2021-07-08: 680 mg via INTRAVENOUS
  Filled 2021-07-08: qty 34

## 2021-07-08 MED ORDER — SODIUM CHLORIDE 0.9 % IV SOLN
1920.0000 mg/m2 | INTRAVENOUS | Status: DC
  Administered 2021-07-08: 3250 mg via INTRAVENOUS
  Filled 2021-07-08: qty 65

## 2021-07-08 MED ORDER — GABAPENTIN 100 MG PO CAPS: 100.0000 mg | ORAL_CAPSULE | Freq: Three times a day (TID) | ORAL | 3 refills | Status: DC

## 2021-07-08 MED ORDER — SODIUM CHLORIDE 0.9% FLUSH
10.0000 mL | INTRAVENOUS | Status: DC | PRN
  Administered 2021-07-08: 10 mL

## 2021-07-08 MED ORDER — PALONOSETRON HCL INJECTION 0.25 MG/5ML
0.2500 mg | Freq: Once | INTRAVENOUS | Status: AC
  Administered 2021-07-08: 0.25 mg via INTRAVENOUS
  Filled 2021-07-08: qty 5

## 2021-07-08 NOTE — Progress Notes (Signed)
Patient tolerated chemotherapy with no complaints voiced. Side effects with management reviewed understanding verbalized. Port site clean and dry with no bruising or swelling noted at site. Good blood return noted before and after administration of chemotherapy. Chemo pump connected with no alarms noted. Patient left in satisfactory condition with VSS and no s/s of distress noted.  °

## 2021-07-08 NOTE — Progress Notes (Signed)
Patient has been examined by Dr. Katragadda, and vital signs and labs have been reviewed. ANC, Creatinine, LFTs, hemoglobin, and platelets are within treatment parameters per M.D. - pt may proceed with treatment.    °

## 2021-07-08 NOTE — Progress Notes (Signed)
[address], [place] [address]   CLINIC:  Medical Oncology/Hematology  PCP:  Patient, No Pcp Per (Inactive) None None   REASON FOR VISIT:  Follow-up for metastatic colon cancer to liver  PRIOR THERAPY: Sigmoid colectomy and end colostomy on 04/29/2020  NGS Results: Foundation 1 KRAS wildtype, MS--stable, TMB 4 Muts/Mb  CURRENT THERAPY: FOLFOX, Avastin & Aloxi every 2 weeks  BRIEF ONCOLOGIC HISTORY:  Oncology History  Metastatic colon cancer to liver ([facility])  05/16/2020 Initial Diagnosis   Metastatic colon cancer to liver ([facility])   06/03/2020 Genetic Testing   Foundation One:     06/04/2020 -  Chemotherapy   Patient is on Treatment Plan : COLORECTAL FOLFOX + Bevacizumab q14d       CANCER STAGING:  Cancer Staging  No matching staging information was found for the patient.  INTERVAL HISTORY:  [name], a 61 y.o. female, returns for routine follow-up and consideration for next cycle of chemotherapy. Susan Martin was last seen on 06/10/2021.  Due for cycle #29 of FOLFOX + Bevacizumab today.   Overall, she tells me she has been feeling pretty well. She reports stable diarrhea which she believed is associated with anxiety. Her appetite has improved. She denies dysuria and hematuria. She denies current bleeding. She denies ankle swellings.   Overall, she feels ready for next cycle of chemo today.   REVIEW OF SYSTEMS:  Review of Systems  Constitutional:  Negative for appetite change and fatigue.  HENT:   Negative for nosebleeds.   Respiratory:  Negative for hemoptysis.   Cardiovascular:  Negative for leg swelling.  Gastrointestinal:  Positive for diarrhea. Negative for blood in stool.  Genitourinary:  Negative for dysuria and hematuria.   Hematological:  Does not bruise/bleed easily.  Psychiatric/Behavioral:  The patient is nervous/anxious.   All other systems reviewed and are negative.  PAST MEDICAL/SURGICAL HISTORY:  Past Medical  History:  Diagnosis Date   Adenocarcinoma of colon metastatic to [facility] onocology--- dr s. Delton Coombes   04-29-2020 [facility] surgery for perforated colon s/p sigmoid colectomy w/ colostomy; dx  Stage IV colon cancer mets to liver   Anxiety    Depression    Hypertension    followed by dr Glo Herring and oncology  (05-27-2020 per pt does not have pcp yet)   IDA (iron deficiency anemia)    Pulmonary nodule, right    Past Surgical History:  Procedure Laterality Date   ABDOMINAL HYSTERECTOMY  03-31-2016   _0    W/  BILATERAL SALPINOOPHORECTOMY    APPLICATION OF WOUND VAC N/A 04/29/2020   Procedure: APPLICATION OF WOUND VAC;  Surgeon: Mickeal Skinner, MD;  Location: [facility];  Service: General;  Laterality: N/A;   ENDOMETRIAL ABLATION     LAPAROTOMY N/A 04/29/2020   Procedure: EXPLORATORY LAPAROTOMY;  Surgeon: Mickeal Skinner, MD;  Location: [facility];  Service: General;  Laterality: N/A;   PARTIAL COLECTOMY N/A 04/29/2020   Procedure: PARTIAL COLECTOMY WITH END COLOSTOMY;  Surgeon: Kieth Brightly Arta Bruce, MD;  Location: [facility];  Service: General;  Laterality: N/A;   PORTACATH PLACEMENT Right 05/28/2020   Procedure: INSERTION PORT-A-CATH;  Surgeon: Kinsinger, Arta Bruce, MD;  Location[facility];  Service: General;  Laterality: Right;   SALPINGOOPHORECTOMY Left 03/31/2016   Procedure: LEFT SALPINGO OOPHORECTOMY WITH FROZEN SECTION,  ABDOMINAL HYSTERECTOMY WITH BILATERAL SALPINGO-OOPHORECTOMY;  Surgeon: Jonnie Kind, MD;  Location: [facility];  Service: Gynecology;  Laterality: Left;    SOCIAL HISTORY:  Social History   Socioeconomic History   Marital status: Divorced    Spouse name: Not on file   Number of children: Not on file   Years of education: Not on file   Highest education level: Not on file  Occupational History   Not on file  Tobacco Use   Smoking status: Former    Packs/day: 0.50    Years: 5.00    Pack years: 2.50    Types: Cigarettes    Quit date:  03/28/2015    Years since quitting: 6.2   Smokeless tobacco: Never  Vaping Use   Vaping Use: Never used  Substance and Sexual Activity   Alcohol use: No   Drug use: Never   Sexual activity: Yes    Partners: Male    Birth control/protection: Surgical  Other Topics Concern   Not on file  Social History Narrative   Not on file   Social Determinants of Health   Financial Resource Strain: Not on file  Food Insecurity: Not on file  Transportation Needs: Not on file  Physical Activity: Not on file  Stress: Not on file  Social Connections: Not on file  Intimate Partner Violence: Not on file    FAMILY HISTORY:  Family History  Problem Relation Age of Onset   Hypertension Mother    Diabetes Mother    Cirrhosis Father     CURRENT MEDICATIONS:  Current Outpatient Medications  Medication Sig Dispense Refill   BEVACIZUMAB IV Inject 5 mg/kg into the vein every 14 (fourteen) days.     fluorouracil CALGB 41324 in sodium chloride 0.9 % 150 mL Inject 2,400 mg/m2 into the vein over 48 hr.     gabapentin (NEURONTIN) 100 MG capsule Take 1 capsule (100 mg total) by mouth 3 (three) times daily. 90 capsule 3   LEUCOVORIN CALCIUM IV Inject 400 mg/m2 into the vein every 21 ( twenty-one) days.     lidocaine-prilocaine (EMLA) cream Apply small amount to port a cath site and cover with plastic wrap 1 hour prior to chemotherapy appointments 30 g 3   nystatin (MYCOSTATIN) 100000 UNIT/ML suspension Take by mouth.     PARoxetine (PAXIL) 40 MG tablet Take 1 tablet (40 mg total) by mouth every morning. 90 tablet 3   polyethylene glycol (MIRALAX / GLYCOLAX) packet Take 17 g by mouth every other day.     potassium chloride (KLOR-CON M) 10 MEQ tablet Take 2 tablets (20 mEq total) by mouth daily. 60 tablet 6   triamterene-hydrochlorothiazide (MAXZIDE-25) 37.5-25 MG tablet Take 1 tablet by mouth daily. 30 tablet 3   hydrOXYzine (VISTARIL) 25 MG capsule Take 1 capsule (25 mg total) by mouth 3 (three) times  daily as needed. Prn anxiety (Patient not taking: Reported on 07/08/2021) 60 capsule 2   LORazepam (ATIVAN) 1 MG tablet Take 1 tablet (1 mg total) by mouth every 8 (eight) hours as needed for anxiety. (Patient not taking: Reported on 07/08/2021) 90 tablet 1   prochlorperazine (COMPAZINE) 10 MG tablet Take 1 tablet (10 mg total) by mouth every 6 (six) hours as needed (Nausea or vomiting). (Patient not taking: Reported on 07/08/2021) 30 tablet 1   No current facility-administered medications for this visit.    ALLERGIES:  No Known Allergies  PHYSICAL EXAM:  Performance status (ECOG): 1 - Symptomatic but completely ambulatory  There were no vitals filed for this visit. Wt Readings from Last 3 Encounters:  07/08/21 160 lb 9.6 oz (72.8 kg)  06/24/21 160 lb 6.4 oz (  72.8 kg)  06/10/21 161 lb 1.6 oz (73.1 kg)   Physical Exam Vitals reviewed.  Constitutional:      Appearance: Normal appearance.  Cardiovascular:     Rate and Rhythm: Normal rate and regular rhythm.     Pulses: Normal pulses.     Heart sounds: Normal heart sounds.  Pulmonary:     Effort: Pulmonary effort is normal.     Breath sounds: Normal breath sounds.  Neurological:     General: No focal deficit present.     Mental Status: She is alert and oriented to person, place, and time.  Psychiatric:        Mood and Affect: Mood normal.        Behavior: Behavior normal.    LABORATORY DATA:  I have reviewed the labs as listed.  CBC Latest Ref Rng & Units 07/08/2021 06/24/2021 06/10/2021  WBC 4.0 - 10.5 K/uL 4.5 6.2 4.4  Hemoglobin 12.0 - 15.0 g/dL 14.3 14.3 13.7  Hematocrit 36.0 - 46.0 % 41.5 40.8 39.9  Platelets 150 - 400 K/uL 119(L) 139(L) 115(L)   CMP Latest Ref Rng & Units 07/08/2021 06/24/2021 06/10/2021  Glucose 70 - 99 mg/dL 125(H) 134(H) 140(H)  BUN 8 - 23 mg/dL _0 Creatinine 0.44 - 1.00 mg/dL 0.95 0.85 1.03(H)  Sodium 135 - 145 mmol/L 137 134(L) 133(L)  Potassium 3.5 - 5.1 mmol/L 3.4(L) 3.0(L) 3.3(L)   Chloride 98 - 111 mmol/L 102 99 98  CO2 22 - 32 mmol/L _1 Calcium 8.9 - 10.3 mg/dL 9.0 8.8(L) 9.3  Total Protein 6.5 - 8.1 g/dL 7.3 7.3 7.2  Total Bilirubin 0.3 - 1.2 mg/dL 0.8 0.6 0.6  Alkaline Phos 38 - 126 U/L 90 90 80  AST 15 - 41 U/L 38 34 39  ALT 0 - 44 U/L 39 30 33    DIAGNOSTIC IMAGING:  I have independently reviewed the scans and discussed with the patient. No results found.   ASSESSMENT:  1.  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 -CT chest on 05/02/2020 with no evidence of pulmonary metastatic disease. -CTAP on 05/07/2020 with multiple lesions throughout the liver, largest in the right lobe measuring 3.9 cm, lateral segment left lobe measuring 2.6 cm and inferior right lobe confluent lesion 4.2 x 3.7 cm.  No lymphadenopathy. -CEA on 05/02/2020-60.2. -FOLFOX and bevacizumab started on 06/04/2020. 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   2.  Iron deficiency anemia: -Feraheme on 04/30/2020 and 05/06/2020.   3.  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 -Smoked for 3 to 4 years and quit. -Mother had cancer, type unknown.  Maternal uncle had lung cancer.  Maternal aunt had lung cancer.   PLAN:  1.  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 - Last CEA on 06/10/2021 was 2.1. - She has on and off diarrhea secondary to anxiety, not related to chemotherapy. - Reviewed labs today which showed normal LFTs and white count.  Mild thrombocytopenia is stable.  Urine analysis was negative for protein. - Proceed with treatment today and in 2 weeks.   RTC 4 weeks with repeat CT scan and CEA.   2.  Iron deficiency anemia: -  Hemoglobin continues to be in the normal range.  No indication for parenteral iron therapy.   3.  Severe hypokalemia: - Potassium is 3.4.  Continue potassium 20 mEq daily.   4.  Peripheral neuropathy: - Continue gabapentin 100 mg in the mornings.   5.  Anxiety: - Continue Paxil 40 mg daily. - Continue Ativan 1 mg every 8 hours as needed.   Orders placed this encounter:  No orders of the defined types were placed in this encounter.    Derek Jack, MD [facility] [phone]   I, Thana Ates, am acting as a scribe for Dr. Derek Jack.  I, Derek Jack MD, have reviewed the above documentation for accuracy and completeness, and I agree with the above.

## 2021-07-08 NOTE — Patient Instructions (Signed)
[facility]  Discharge Instructions: Thank you for choosing [facility] to provide your oncology and hematology care.  If you have a lab appointment with the [facility], please come in thru the Main Entrance and check in at the main information desk.  Wear comfortable clothing and clothing appropriate for easy access to any Portacath or PICC line.   We strive to give you quality time with your provider. You may need to reschedule your appointment if you arrive late (15 or more minutes).  Arriving late affects you and other patients whose appointments are after yours.  Also, if you miss three or more appointments without notifying the office, you may be dismissed from the clinic at the providers discretion.      For prescription refill requests, have your pharmacy contact our office and allow 72 hours for refills to be completed.    Today you received the following chemotherapy and/or immunotherapy agents MVASI, Leucovorin and 5FU, return as scheduled.   To help prevent nausea and vomiting after your treatment, we encourage you to take your nausea medication as directed.  BELOW ARE SYMPTOMS THAT SHOULD BE REPORTED IMMEDIATELY: *FEVER GREATER THAN 100.4 F (38 C) OR HIGHER *CHILLS OR SWEATING *NAUSEA AND VOMITING THAT IS NOT CONTROLLED WITH YOUR NAUSEA MEDICATION *UNUSUAL SHORTNESS OF BREATH *UNUSUAL BRUISING OR BLEEDING *URINARY PROBLEMS (pain or burning when urinating, or frequent urination) *BOWEL PROBLEMS (unusual diarrhea, constipation, pain near the anus) TENDERNESS IN MOUTH AND THROAT WITH OR WITHOUT PRESENCE OF ULCERS (sore throat, sores in mouth, or a toothache) UNUSUAL RASH, SWELLING OR PAIN  UNUSUAL VAGINAL DISCHARGE OR ITCHING   Items with * indicate a potential emergency and should be followed up as soon as possible or go to the Emergency Department if any problems should occur.  Please show the CHEMOTHERAPY ALERT CARD or IMMUNOTHERAPY ALERT CARD  at check-in to the Emergency Department and triage nurse.  Should you have questions after your visit or need to cancel or reschedule your appointment, please contact [facility] [phone]  and follow the prompts.  Office hours are 8:00 a.m. to 4:30 p.m. Monday - Friday. Please note that voicemails left after 4:00 p.m. may not be returned until the following business day.  We are closed weekends and major holidays. You have access to a nurse at all times for urgent questions. Please call the main number to the clinic [phone] and follow the prompts.  For any non-urgent questions, you may also contact your provider using MyChart. We now offer e-Visits for anyone 69 and older to request care online for non-urgent symptoms. For details visit [email or web address].   Also download the MyChart app! Go to the app store, search "MyChart", open the app, select [facility], and log in with your MyChart username and password.  Due to Covid, a mask is required upon entering the hospital/clinic. If you do not have a mask, one will be given to you upon arrival. For doctor visits, patients may have 1 support person aged 97 or older with them. For treatment visits, patients cannot have anyone with them due to current Covid guidelines and our immunocompromised population.

## 2021-07-08 NOTE — Patient Instructions (Signed)
[facility] at [facility] Discharge Instructions   You were seen and examined today by Dr. Delton Coombes.  He reviewed your lab work which is normal/stable.  We will proceed with your treatment today.  We will repeat a CT scan in about 4 weeks.  Return as scheduled.    Thank you for choosing [facility] at [facility] to provide your oncology and hematology care.  To afford each patient quality time with our provider, please arrive at least 15 minutes before your scheduled appointment time.   If you have a lab appointment with the [facility] please come in thru the Main Entrance and check in at the main information desk.  You need to re-schedule your appointment should you arrive 10 or more minutes late.  We strive to give you quality time with our providers, and arriving late affects you and other patients whose appointments are after yours.  Also, if you no show three or more times for appointments you may be dismissed from the clinic at the providers discretion.     Again, thank you for choosing [facility].  Our hope is that these requests will decrease the amount of time that you wait before being seen by our physicians.       _____________________________________________________________  Should you have questions after your visit to [facility], please contact our office at [phone] and follow the prompts.  Our office hours are 8:00 a.m. and 4:30 p.m. Monday - Friday.  Please note that voicemails left after 4:00 p.m. may not be returned until the following business day.  We are closed weekends and major holidays.  You do have access to a nurse 24-7, just call the main number to the clinic [phone] and do not press any options, hold on the line and a nurse will answer the phone.    For prescription refill requests, have your pharmacy contact our office and allow 72 hours.    Due to Covid, you will need to  wear a mask upon entering the hospital. If you do not have a mask, a mask will be given to you at the Main Entrance upon arrival. For doctor visits, patients may have 1 support person age 37 or older with them. For treatment visits, patients can not have anyone with them due to social distancing guidelines and our immunocompromised population.

## 2021-07-09 LAB — CEA: CEA: 1.9 ng/mL (ref 0.0–4.7)

## 2021-07-10 ENCOUNTER — Inpatient Hospital Stay (HOSPITAL_COMMUNITY): Payer: Medicaid Other

## 2021-07-10 ENCOUNTER — Other Ambulatory Visit: Payer: Self-pay

## 2021-07-10 VITALS — BP 157/95 | HR 76 | Temp 97.3°F | Resp 18

## 2021-07-10 DIAGNOSIS — Z5112 Encounter for antineoplastic immunotherapy: Secondary | ICD-10-CM | POA: Diagnosis not present

## 2021-07-10 DIAGNOSIS — C189 Malignant neoplasm of colon, unspecified: Secondary | ICD-10-CM

## 2021-07-10 MED ORDER — HEPARIN SOD (PORK) LOCK FLUSH 100 UNIT/ML IV SOLN
500.0000 [IU] | Freq: Once | INTRAVENOUS | Status: AC | PRN
  Administered 2021-07-10: 500 [IU]

## 2021-07-10 MED ORDER — SODIUM CHLORIDE 0.9% FLUSH
10.0000 mL | INTRAVENOUS | Status: DC | PRN
  Administered 2021-07-10: 10 mL

## 2021-07-10 NOTE — Progress Notes (Signed)
Pt presents today for 5FU chemotherapy disconnection per provider's order. Vital signs stable and pt voiced no new complaints at this time. Port flushed without difficulty with 10 ml of NS and 57mls of heparin. Port flushed with good blood return. Needle removed intact, no bruising or swelling noted at the site.  5FU pump disconnection removed today per MD orders. Tolerated infusion without adverse affects. Vital signs stable. No complaints at this time. Discharged from clinic ambulatory in stable condition. Alert and oriented x 3. F/U with [facility] as scheduled.

## 2021-07-22 ENCOUNTER — Other Ambulatory Visit: Payer: Self-pay

## 2021-07-22 ENCOUNTER — Inpatient Hospital Stay (HOSPITAL_COMMUNITY): Payer: Medicaid Other

## 2021-07-22 VITALS — BP 167/97 | HR 78 | Temp 97.3°F | Resp 18

## 2021-07-22 DIAGNOSIS — I10 Essential (primary) hypertension: Secondary | ICD-10-CM

## 2021-07-22 DIAGNOSIS — C787 Secondary malignant neoplasm of liver and intrahepatic bile duct: Secondary | ICD-10-CM

## 2021-07-22 DIAGNOSIS — Z5112 Encounter for antineoplastic immunotherapy: Secondary | ICD-10-CM | POA: Diagnosis not present

## 2021-07-22 LAB — URINALYSIS, DIPSTICK ONLY
Bilirubin Urine: NEGATIVE
Glucose, UA: NEGATIVE mg/dL
Ketones, ur: NEGATIVE mg/dL
Nitrite: NEGATIVE
Protein, ur: 30 mg/dL — AB
Specific Gravity, Urine: 1.023 (ref 1.005–1.030)
pH: 5 (ref 5.0–8.0)

## 2021-07-22 LAB — CBC WITH DIFFERENTIAL/PLATELET
Abs Immature Granulocytes: 0.01 10*3/uL (ref 0.00–0.07)
Basophils Absolute: 0 10*3/uL (ref 0.0–0.1)
Basophils Relative: 1 %
Eosinophils Absolute: 0.1 10*3/uL (ref 0.0–0.5)
Eosinophils Relative: 2 %
HCT: 39.9 % (ref 36.0–46.0)
Hemoglobin: 13.8 g/dL (ref 12.0–15.0)
Immature Granulocytes: 0 %
Lymphocytes Relative: 28 %
Lymphs Abs: 1.4 10*3/uL (ref 0.7–4.0)
MCH: 32.5 pg (ref 26.0–34.0)
MCHC: 34.6 g/dL (ref 30.0–36.0)
MCV: 93.9 fL (ref 80.0–100.0)
Monocytes Absolute: 0.5 10*3/uL (ref 0.1–1.0)
Monocytes Relative: 10 %
Neutro Abs: 3 10*3/uL (ref 1.7–7.7)
Neutrophils Relative %: 59 %
Platelets: 103 10*3/uL — ABNORMAL LOW (ref 150–400)
RBC: 4.25 MIL/uL (ref 3.87–5.11)
RDW: 16.2 % — ABNORMAL HIGH (ref 11.5–15.5)
WBC: 5.1 10*3/uL (ref 4.0–10.5)
nRBC: 0 % (ref 0.0–0.2)

## 2021-07-22 LAB — COMPREHENSIVE METABOLIC PANEL
ALT: 46 U/L — ABNORMAL HIGH (ref 0–44)
AST: 45 U/L — ABNORMAL HIGH (ref 15–41)
Albumin: 3.7 g/dL (ref 3.5–5.0)
Alkaline Phosphatase: 94 U/L (ref 38–126)
Anion gap: 6 (ref 5–15)
BUN: 14 mg/dL (ref 8–23)
CO2: 28 mmol/L (ref 22–32)
Calcium: 9.1 mg/dL (ref 8.9–10.3)
Chloride: 100 mmol/L (ref 98–111)
Creatinine, Ser: 0.97 mg/dL (ref 0.44–1.00)
GFR, Estimated: >60 mL/min (ref >60)
Glucose, Bld: 133 mg/dL — ABNORMAL HIGH (ref 70–99)
Potassium: 3.1 mmol/L — ABNORMAL LOW (ref 3.5–5.1)
Sodium: 134 mmol/L — ABNORMAL LOW (ref 135–145)
Total Bilirubin: 0.5 mg/dL (ref 0.3–1.2)
Total Protein: 7.2 g/dL (ref 6.5–8.1)

## 2021-07-22 LAB — MAGNESIUM: Magnesium: 2 mg/dL (ref 1.7–2.4)

## 2021-07-22 MED ORDER — PALONOSETRON HCL INJECTION 0.25 MG/5ML
0.2500 mg | Freq: Once | INTRAVENOUS | Status: AC
  Administered 2021-07-22: 10:00:00 0.25 mg via INTRAVENOUS
  Filled 2021-07-22: qty 5

## 2021-07-22 MED ORDER — SODIUM CHLORIDE 0.9 % IV SOLN
5.0000 mg/kg | Freq: Once | INTRAVENOUS | Status: AC
  Administered 2021-07-22: 11:00:00 400 mg via INTRAVENOUS
  Filled 2021-07-22: qty 16

## 2021-07-22 MED ORDER — SODIUM CHLORIDE 0.9 % IV SOLN: Freq: Once | INTRAVENOUS | Status: AC

## 2021-07-22 MED ORDER — SODIUM CHLORIDE 0.9 % IV SOLN
1920.0000 mg/m2 | INTRAVENOUS | Status: DC
  Administered 2021-07-22: 12:00:00 3250 mg via INTRAVENOUS
  Filled 2021-07-22: qty 65

## 2021-07-22 MED ORDER — SODIUM CHLORIDE 0.9 % IV SOLN
10.0000 mg | Freq: Once | INTRAVENOUS | Status: AC
  Administered 2021-07-22: 10:00:00 10 mg via INTRAVENOUS
  Filled 2021-07-22: qty 10

## 2021-07-22 MED ORDER — HEPARIN SOD (PORK) LOCK FLUSH 100 UNIT/ML IV SOLN: 500.0000 [IU] | Freq: Once | INTRAVENOUS | Status: DC | PRN

## 2021-07-22 MED ORDER — SODIUM CHLORIDE 0.9 % IV SOLN
400.0000 mg/m2 | Freq: Once | INTRAVENOUS | Status: AC
  Administered 2021-07-22: 11:00:00 680 mg via INTRAVENOUS
  Filled 2021-07-22: qty 34

## 2021-07-22 MED ORDER — POTASSIUM CHLORIDE CRYS ER 20 MEQ PO TBCR
40.0000 meq | EXTENDED_RELEASE_TABLET | Freq: Once | ORAL | Status: AC
  Administered 2021-07-22: 10:00:00 40 meq via ORAL
  Filled 2021-07-22: qty 2

## 2021-07-22 MED ORDER — SODIUM CHLORIDE 0.9% FLUSH: 10.0000 mL | INTRAVENOUS | Status: DC | PRN

## 2021-07-22 MED ORDER — FLUOROURACIL CHEMO INJECTION 2.5 GM/50ML
320.0000 mg/m2 | Freq: Once | INTRAVENOUS | Status: AC
  Administered 2021-07-22: 12:00:00 550 mg via INTRAVENOUS
  Filled 2021-07-22: qty 11

## 2021-07-22 NOTE — Progress Notes (Signed)
Patients port flushed without difficulty.  Good blood return noted with no bruising or swelling noted at site.  Stable during access and blood draw.  Patient to remain accessed for treatment. 

## 2021-07-22 NOTE — Addendum Note (Signed)
Addended by: Benjiman Core D on: 07/22/2021 08:27 AM   Modules accepted: Orders

## 2021-07-22 NOTE — Patient Instructions (Signed)
[facility]  Discharge Instructions: Thank you for choosing [facility] to provide your oncology and hematology care.  If you have a lab appointment with the [facility], please come in thru the Main Entrance and check in at the main information desk.  Wear comfortable clothing and clothing appropriate for easy access to any Portacath or PICC line.   We strive to give you quality time with your provider. You may need to reschedule your appointment if you arrive late (15 or more minutes).  Arriving late affects you and other patients whose appointments are after yours.  Also, if you miss three or more appointments without notifying the office, you may be dismissed from the clinic at the providers discretion.      For prescription refill requests, have your pharmacy contact our office and allow 72 hours for refills to be completed.    Today you received the following chemotherapy and/or immunotherapy agents MVASI/Leucovorin 5FU pump connection      To help prevent nausea and vomiting after your treatment, we encourage you to take your nausea medication as directed.  BELOW ARE SYMPTOMS THAT SHOULD BE REPORTED IMMEDIATELY: *FEVER GREATER THAN 100.4 F (38 C) OR HIGHER *CHILLS OR SWEATING *NAUSEA AND VOMITING THAT IS NOT CONTROLLED WITH YOUR NAUSEA MEDICATION *UNUSUAL SHORTNESS OF BREATH *UNUSUAL BRUISING OR BLEEDING *URINARY PROBLEMS (pain or burning when urinating, or frequent urination) *BOWEL PROBLEMS (unusual diarrhea, constipation, pain near the anus) TENDERNESS IN MOUTH AND THROAT WITH OR WITHOUT PRESENCE OF ULCERS (sore throat, sores in mouth, or a toothache) UNUSUAL RASH, SWELLING OR PAIN  UNUSUAL VAGINAL DISCHARGE OR ITCHING   Items with * indicate a potential emergency and should be followed up as soon as possible or go to the Emergency Department if any problems should occur.  Please show the CHEMOTHERAPY ALERT CARD or IMMUNOTHERAPY ALERT CARD at  check-in to the Emergency Department and triage nurse.  Should you have questions after your visit or need to cancel or reschedule your appointment, please contact [facility] [phone]  and follow the prompts.  Office hours are 8:00 a.m. to 4:30 p.m. Monday - Friday. Please note that voicemails left after 4:00 p.m. may not be returned until the following business day.  We are closed weekends and major holidays. You have access to a nurse at all times for urgent questions. Please call the main number to the clinic [phone] and follow the prompts.  For any non-urgent questions, you may also contact your provider using MyChart. We now offer e-Visits for anyone 34 and older to request care online for non-urgent symptoms. For details visit [email or web address].   Also download the MyChart app! Go to the app store, search "MyChart", open the app, select [place], and log in with your MyChart username and password.  Due to Covid, a mask is required upon entering the hospital/clinic. If you do not have a mask, one will be given to you upon arrival. For doctor visits, patients may have 1 support person aged 58 or older with them. For treatment visits, patients cannot have anyone with them due to current Covid guidelines and our immunocompromised population.

## 2021-07-22 NOTE — Progress Notes (Signed)
Patient presents today for MVSI/Leucovorin and 5FU pump start per providers order.  Vital signs within parameters for treatment.  Labs pending.  Patient has no new complaints at this time.  Labs within parameters for treatment.  MVSI/Leucovorin infusion given today per MD orders.  Stable during infusion without adverse affects.  Pump connected and Verified RUN on the screen with the patient.  Vital signs stable.  No complaints at this time.  Discharge from clinic ambulatory in stable condition.  Alert and oriented X 3.  Follow up with [facility] as scheduled.

## 2021-07-24 ENCOUNTER — Inpatient Hospital Stay (HOSPITAL_COMMUNITY): Payer: Medicaid Other

## 2021-07-24 ENCOUNTER — Other Ambulatory Visit: Payer: Self-pay

## 2021-07-24 VITALS — BP 160/88 | HR 76 | Temp 98.2°F | Resp 18

## 2021-07-24 DIAGNOSIS — Z5112 Encounter for antineoplastic immunotherapy: Secondary | ICD-10-CM | POA: Diagnosis not present

## 2021-07-24 DIAGNOSIS — C189 Malignant neoplasm of colon, unspecified: Secondary | ICD-10-CM

## 2021-07-24 DIAGNOSIS — C787 Secondary malignant neoplasm of liver and intrahepatic bile duct: Secondary | ICD-10-CM

## 2021-07-24 MED ORDER — HEPARIN SOD (PORK) LOCK FLUSH 100 UNIT/ML IV SOLN
500.0000 [IU] | Freq: Once | INTRAVENOUS | Status: AC | PRN
  Administered 2021-07-24: 500 [IU]

## 2021-07-24 MED ORDER — SODIUM CHLORIDE 0.9% FLUSH
10.0000 mL | INTRAVENOUS | Status: DC | PRN
  Administered 2021-07-24: 10 mL

## 2021-07-24 NOTE — Patient Instructions (Signed)
[facility]  Discharge Instructions: Thank you for choosing [facility] to provide your oncology and hematology care.  If you have a lab appointment with the [facility], please come in thru the Main Entrance and check in at the main information desk.  Wear comfortable clothing and clothing appropriate for easy access to any Portacath or PICC line.   We strive to give you quality time with your provider. You may need to reschedule your appointment if you arrive late (15 or more minutes).  Arriving late affects you and other patients whose appointments are after yours.  Also, if you miss three or more appointments without notifying the office, you may be dismissed from the clinic at the providers discretion.      For prescription refill requests, have your pharmacy contact our office and allow 72 hours for refills to be completed.    Today you received the following chemotherapy and/or immunotherapy agents Pump Deaccess      To help prevent nausea and vomiting after your treatment, we encourage you to take your nausea medication as directed.  BELOW ARE SYMPTOMS THAT SHOULD BE REPORTED IMMEDIATELY: *FEVER GREATER THAN 100.4 F (38 C) OR HIGHER *CHILLS OR SWEATING *NAUSEA AND VOMITING THAT IS NOT CONTROLLED WITH YOUR NAUSEA MEDICATION *UNUSUAL SHORTNESS OF BREATH *UNUSUAL BRUISING OR BLEEDING *URINARY PROBLEMS (pain or burning when urinating, or frequent urination) *BOWEL PROBLEMS (unusual diarrhea, constipation, pain near the anus) TENDERNESS IN MOUTH AND THROAT WITH OR WITHOUT PRESENCE OF ULCERS (sore throat, sores in mouth, or a toothache) UNUSUAL RASH, SWELLING OR PAIN  UNUSUAL VAGINAL DISCHARGE OR ITCHING   Items with * indicate a potential emergency and should be followed up as soon as possible or go to the Emergency Department if any problems should occur.  Please show the CHEMOTHERAPY ALERT CARD or IMMUNOTHERAPY ALERT CARD at check-in to the Emergency  Department and triage nurse.  Should you have questions after your visit or need to cancel or reschedule your appointment, please contact [facility] [phone]  and follow the prompts.  Office hours are 8:00 a.m. to 4:30 p.m. Monday - Friday. Please note that voicemails left after 4:00 p.m. may not be returned until the following business day.  We are closed weekends and major holidays. You have access to a nurse at all times for urgent questions. Please call the main number to the clinic [phone] and follow the prompts.  For any non-urgent questions, you may also contact your provider using MyChart. We now offer e-Visits for anyone 30 and older to request care online for non-urgent symptoms. For details visit [email or web address].   Also download the MyChart app! Go to the app store, search "MyChart", open the app, select [facility], and log in with your MyChart username and password.  Due to Covid, a mask is required upon entering the hospital/clinic. If you do not have a mask, one will be given to you upon arrival. For doctor visits, patients may have 1 support person aged 60 or older with them. For treatment visits, patients cannot have anyone with them due to current Covid guidelines and our immunocompromised population.

## 2021-07-24 NOTE — Progress Notes (Signed)
Patient presents today for 5FU pump stop and disconnection after 46 hour continous infusion.   5FU pump deaccessed.  Patients port flushed without difficulty.  Good blood return noted with no bruising or swelling noted at site.  needle removed intact.  Band aid applied.  VSS with discharge and left in satisfactory condition via wheelchair with no s/s of distress noted.

## 2021-07-25 ENCOUNTER — Encounter (HOSPITAL_COMMUNITY): Payer: Self-pay | Admitting: Hematology

## 2021-08-04 ENCOUNTER — Ambulatory Visit (HOSPITAL_COMMUNITY)
Admission: RE | Admit: 2021-08-04 | Discharge: 2021-08-04 | Disposition: A | Discharge status: Home / Self Care | Payer: Medicaid Other | Source: Ambulatory Visit | Attending: Hematology | Admitting: Hematology

## 2021-08-04 ENCOUNTER — Encounter (HOSPITAL_COMMUNITY): Payer: Self-pay

## 2021-08-04 ENCOUNTER — Other Ambulatory Visit: Payer: Self-pay

## 2021-08-04 ENCOUNTER — Inpatient Hospital Stay (HOSPITAL_COMMUNITY): Payer: Medicaid Other | Attending: Hematology

## 2021-08-04 DIAGNOSIS — C189 Malignant neoplasm of colon, unspecified: Secondary | ICD-10-CM | POA: Diagnosis present

## 2021-08-04 DIAGNOSIS — Z801 Family history of malignant neoplasm of trachea, bronchus and lung: Secondary | ICD-10-CM | POA: Insufficient documentation

## 2021-08-04 DIAGNOSIS — C787 Secondary malignant neoplasm of liver and intrahepatic bile duct: Secondary | ICD-10-CM | POA: Insufficient documentation

## 2021-08-04 DIAGNOSIS — C187 Malignant neoplasm of sigmoid colon: Secondary | ICD-10-CM | POA: Insufficient documentation

## 2021-08-04 DIAGNOSIS — Z809 Family history of malignant neoplasm, unspecified: Secondary | ICD-10-CM | POA: Insufficient documentation

## 2021-08-04 DIAGNOSIS — E876 Hypokalemia: Secondary | ICD-10-CM | POA: Insufficient documentation

## 2021-08-04 DIAGNOSIS — D509 Iron deficiency anemia, unspecified: Secondary | ICD-10-CM | POA: Insufficient documentation

## 2021-08-04 DIAGNOSIS — Z87891 Personal history of nicotine dependence: Secondary | ICD-10-CM | POA: Insufficient documentation

## 2021-08-04 DIAGNOSIS — Z5111 Encounter for antineoplastic chemotherapy: Secondary | ICD-10-CM | POA: Insufficient documentation

## 2021-08-04 DIAGNOSIS — G629 Polyneuropathy, unspecified: Secondary | ICD-10-CM | POA: Insufficient documentation

## 2021-08-04 DIAGNOSIS — F419 Anxiety disorder, unspecified: Secondary | ICD-10-CM | POA: Insufficient documentation

## 2021-08-04 DIAGNOSIS — I10 Essential (primary) hypertension: Secondary | ICD-10-CM | POA: Insufficient documentation

## 2021-08-04 LAB — CBC WITH DIFFERENTIAL/PLATELET
Abs Immature Granulocytes: 0.02 10*3/uL (ref 0.00–0.07)
Basophils Absolute: 0 10*3/uL (ref 0.0–0.1)
Basophils Relative: 1 %
Eosinophils Absolute: 0.1 10*3/uL (ref 0.0–0.5)
Eosinophils Relative: 2 %
HCT: 40.5 % (ref 36.0–46.0)
Hemoglobin: 13.5 g/dL (ref 12.0–15.0)
Immature Granulocytes: 0 %
Lymphocytes Relative: 26 %
Lymphs Abs: 1.7 10*3/uL (ref 0.7–4.0)
MCH: 31 pg (ref 26.0–34.0)
MCHC: 33.3 g/dL (ref 30.0–36.0)
MCV: 93.1 fL (ref 80.0–100.0)
Monocytes Absolute: 0.7 10*3/uL (ref 0.1–1.0)
Monocytes Relative: 12 %
Neutro Abs: 3.7 10*3/uL (ref 1.7–7.7)
Neutrophils Relative %: 59 %
Platelets: 118 10*3/uL — ABNORMAL LOW (ref 150–400)
RBC: 4.35 MIL/uL (ref 3.87–5.11)
RDW: 16.1 % — ABNORMAL HIGH (ref 11.5–15.5)
WBC: 6.3 10*3/uL (ref 4.0–10.5)
nRBC: 0 % (ref 0.0–0.2)

## 2021-08-04 LAB — COMPREHENSIVE METABOLIC PANEL
ALT: 38 U/L (ref 0–44)
AST: 38 U/L (ref 15–41)
Albumin: 3.8 g/dL (ref 3.5–5.0)
Alkaline Phosphatase: 83 U/L (ref 38–126)
Anion gap: 8 (ref 5–15)
BUN: 10 mg/dL (ref 8–23)
CO2: 26 mmol/L (ref 22–32)
Calcium: 9.1 mg/dL (ref 8.9–10.3)
Chloride: 100 mmol/L (ref 98–111)
Creatinine, Ser: 0.96 mg/dL (ref 0.44–1.00)
GFR, Estimated: >60 mL/min (ref >60)
Glucose, Bld: 113 mg/dL — ABNORMAL HIGH (ref 70–99)
Potassium: 3.7 mmol/L (ref 3.5–5.1)
Sodium: 134 mmol/L — ABNORMAL LOW (ref 135–145)
Total Bilirubin: 0.6 mg/dL (ref 0.3–1.2)
Total Protein: 7.4 g/dL (ref 6.5–8.1)

## 2021-08-04 LAB — MAGNESIUM: Magnesium: 2 mg/dL (ref 1.7–2.4)

## 2021-08-04 MED ORDER — IOHEXOL 300 MG/ML  SOLN
100.0000 mL | Freq: Once | INTRAMUSCULAR | Status: AC | PRN
  Administered 2021-08-04: 100 mL via INTRAVENOUS

## 2021-08-04 MED ORDER — HEPARIN SOD (PORK) LOCK FLUSH 100 UNIT/ML IV SOLN: 500.0000 [IU] | Freq: Once | INTRAVENOUS | Status: AC

## 2021-08-04 MED ORDER — HEPARIN SOD (PORK) LOCK FLUSH 100 UNIT/ML IV SOLN
INTRAVENOUS | Status: AC
  Administered 2021-08-04: 500 [IU] via INTRAVENOUS
  Filled 2021-08-04: qty 5

## 2021-08-04 MED ORDER — SODIUM CHLORIDE 0.9% FLUSH: 10.0000 mL | Freq: Once | INTRAVENOUS | Status: DC

## 2021-08-04 NOTE — Progress Notes (Signed)
Labs drawn for Ct scan and treatment for tomorrow.  Patients port flushed without difficulty.  Good blood return noted with no bruising or swelling noted at site.  Patient left accessed for Ct scan.   VSS with discharge and left in satisfactory condition with no s/s of distress noted.

## 2021-08-05 ENCOUNTER — Inpatient Hospital Stay (HOSPITAL_BASED_OUTPATIENT_CLINIC_OR_DEPARTMENT_OTHER): Payer: Medicaid Other | Admitting: Hematology

## 2021-08-05 ENCOUNTER — Other Ambulatory Visit (HOSPITAL_COMMUNITY): Payer: Medicaid Other

## 2021-08-05 ENCOUNTER — Inpatient Hospital Stay (HOSPITAL_COMMUNITY): Payer: Medicaid Other

## 2021-08-05 VITALS — BP 158/84 | HR 80 | Temp 97.8°F | Resp 18 | Ht 63.0 in | Wt 164.7 lb

## 2021-08-05 DIAGNOSIS — Z801 Family history of malignant neoplasm of trachea, bronchus and lung: Secondary | ICD-10-CM | POA: Diagnosis not present

## 2021-08-05 DIAGNOSIS — C189 Malignant neoplasm of colon, unspecified: Secondary | ICD-10-CM

## 2021-08-05 DIAGNOSIS — Z5111 Encounter for antineoplastic chemotherapy: Secondary | ICD-10-CM | POA: Diagnosis not present

## 2021-08-05 DIAGNOSIS — C787 Secondary malignant neoplasm of liver and intrahepatic bile duct: Secondary | ICD-10-CM | POA: Diagnosis not present

## 2021-08-05 DIAGNOSIS — G629 Polyneuropathy, unspecified: Secondary | ICD-10-CM | POA: Diagnosis not present

## 2021-08-05 DIAGNOSIS — Z809 Family history of malignant neoplasm, unspecified: Secondary | ICD-10-CM | POA: Diagnosis not present

## 2021-08-05 DIAGNOSIS — D509 Iron deficiency anemia, unspecified: Secondary | ICD-10-CM | POA: Diagnosis not present

## 2021-08-05 DIAGNOSIS — E876 Hypokalemia: Secondary | ICD-10-CM | POA: Diagnosis not present

## 2021-08-05 DIAGNOSIS — F419 Anxiety disorder, unspecified: Secondary | ICD-10-CM | POA: Diagnosis not present

## 2021-08-05 DIAGNOSIS — C187 Malignant neoplasm of sigmoid colon: Secondary | ICD-10-CM | POA: Diagnosis not present

## 2021-08-05 DIAGNOSIS — I10 Essential (primary) hypertension: Secondary | ICD-10-CM | POA: Diagnosis not present

## 2021-08-05 DIAGNOSIS — Z87891 Personal history of nicotine dependence: Secondary | ICD-10-CM | POA: Diagnosis not present

## 2021-08-05 LAB — CEA: CEA: 2.2 ng/mL (ref 0.0–4.7)

## 2021-08-05 MED ORDER — FLUOROURACIL CHEMO INJECTION 2.5 GM/50ML
320.0000 mg/m2 | Freq: Once | INTRAVENOUS | Status: AC
  Administered 2021-08-05: 550 mg via INTRAVENOUS
  Filled 2021-08-05: qty 11

## 2021-08-05 MED ORDER — SODIUM CHLORIDE 0.9 % IV SOLN
1920.0000 mg/m2 | INTRAVENOUS | Status: DC
  Administered 2021-08-05: 3250 mg via INTRAVENOUS
  Filled 2021-08-05: qty 65

## 2021-08-05 MED ORDER — SODIUM CHLORIDE 0.9 % IV SOLN
5.0000 mg/kg | Freq: Once | INTRAVENOUS | Status: AC
  Administered 2021-08-05: 400 mg via INTRAVENOUS
  Filled 2021-08-05: qty 16

## 2021-08-05 MED ORDER — AMLODIPINE BESYLATE 5 MG PO TABS: 5.0000 mg | ORAL_TABLET | Freq: Every day | ORAL | 3 refills | Status: DC

## 2021-08-05 MED ORDER — PALONOSETRON HCL INJECTION 0.25 MG/5ML
0.2500 mg | Freq: Once | INTRAVENOUS | Status: AC
  Administered 2021-08-05: 0.25 mg via INTRAVENOUS
  Filled 2021-08-05: qty 5

## 2021-08-05 MED ORDER — SODIUM CHLORIDE 0.9 % IV SOLN
400.0000 mg/m2 | Freq: Once | INTRAVENOUS | Status: AC
  Administered 2021-08-05: 680 mg via INTRAVENOUS
  Filled 2021-08-05: qty 34

## 2021-08-05 MED ORDER — SODIUM CHLORIDE 0.9 % IV SOLN: Freq: Once | INTRAVENOUS | Status: AC

## 2021-08-05 MED ORDER — SODIUM CHLORIDE 0.9% FLUSH: 10.0000 mL | INTRAVENOUS | Status: DC | PRN

## 2021-08-05 MED ORDER — HEPARIN SOD (PORK) LOCK FLUSH 100 UNIT/ML IV SOLN: 500.0000 [IU] | Freq: Once | INTRAVENOUS | Status: DC | PRN

## 2021-08-05 MED ORDER — SODIUM CHLORIDE 0.9 % IV SOLN
10.0000 mg | Freq: Once | INTRAVENOUS | Status: AC
  Administered 2021-08-05: 10 mg via INTRAVENOUS
  Filled 2021-08-05: qty 10

## 2021-08-05 NOTE — Patient Instructions (Signed)
[facility]  Discharge Instructions: Thank you for choosing [facility] to provide your oncology and hematology care.  If you have a lab appointment with the Cancer Center, please come in thru the Main Entrance and check in at the main information desk.  Wear comfortable clothing and clothing appropriate for easy access to any Portacath or PICC line.   We strive to give you quality time with your provider. You may need to reschedule your appointment if you arrive late (15 or more minutes).  Arriving late affects you and other patients whose appointments are after yours.  Also, if you miss three or more appointments without notifying the office, you may be dismissed from the clinic at the provider's discretion.      For prescription refill requests, have your pharmacy contact our office and allow 72 hours for refills to be completed.        To help prevent nausea and vomiting after your treatment, we encourage you to take your nausea medication as directed.  BELOW ARE SYMPTOMS THAT SHOULD BE REPORTED IMMEDIATELY: *FEVER GREATER THAN 100.4 F (38 C) OR HIGHER *CHILLS OR SWEATING *NAUSEA AND VOMITING THAT IS NOT CONTROLLED WITH YOUR NAUSEA MEDICATION *UNUSUAL SHORTNESS OF BREATH *UNUSUAL BRUISING OR BLEEDING *URINARY PROBLEMS (pain or burning when urinating, or frequent urination) *BOWEL PROBLEMS (unusual diarrhea, constipation, pain near the anus) TENDERNESS IN MOUTH AND THROAT WITH OR WITHOUT PRESENCE OF ULCERS (sore throat, sores in mouth, or a toothache) UNUSUAL RASH, SWELLING OR PAIN  UNUSUAL VAGINAL DISCHARGE OR ITCHING   Items with * indicate a potential emergency and should be followed up as soon as possible or go to the Emergency Department if any problems should occur.  Please show the CHEMOTHERAPY ALERT CARD or IMMUNOTHERAPY ALERT CARD at check-in to the Emergency Department and triage nurse.  Should you have questions after your visit or need to cancel  or reschedule your appointment, please contact [facility] [phone]  and follow the prompts.  Office hours are 8:00 a.m. to 4:30 p.m. Monday - Friday. Please note that voicemails left after 4:00 p.m. may not be returned until the following business day.  We are closed weekends and major holidays. You have access to a nurse at all times for urgent questions. Please call the main number to the clinic [phone] and follow the prompts.  For any non-urgent questions, you may also contact your provider using MyChart. We now offer e-Visits for anyone 18 and older to request care online for non-urgent symptoms. For details visit [email or web address].   Also download the MyChart app! Go to the app store, search "MyChart", open the app, select [facility], and log in with your MyChart username and password.  Due to Covid, a mask is required upon entering the hospital/clinic. If you do not have a mask, one will be given to you upon arrival. For doctor visits, patients may have 1 support person aged 18 or older with them. For treatment visits, patients cannot have anyone with them due to current Covid guidelines and our immunocompromised population.  

## 2021-08-05 NOTE — Patient Instructions (Signed)
[place] at [facility] Discharge Instructions   You were seen and examined today by Dr. Delton Coombes.  He reviewed the results of your lab work and CT scan - all results are normal/stable.  We will send in a new blood pressure medication for you to take called amlodipine (Norvasc).  Take this once a day in addition to your other blood pressure medication.  We will proceed with your treatment today.  Return as scheduled for lab work and treatment.    Thank you for choosing [place] at [facility] to provide your oncology and hematology care.  To afford each patient quality time with our provider, please arrive at least 15 minutes before your scheduled appointment time.   If you have a lab appointment with the [facility] please come in thru the Main Entrance and check in at the main information desk.  You need to re-schedule your appointment should you arrive 10 or more minutes late.  We strive to give you quality time with our providers, and arriving late affects you and other patients whose appointments are after yours.  Also, if you no show three or more times for appointments you may be dismissed from the clinic at the providers discretion.     Again, thank you for choosing [facility].  Our hope is that these requests will decrease the amount of time that you wait before being seen by our physicians.       _____________________________________________________________  Should you have questions after your visit to [facility], please contact our office at [phone] and follow the prompts.  Our office hours are 8:00 a.m. and 4:30 p.m. Monday - Friday.  Please note that voicemails left after 4:00 p.m. may not be returned until the following business day.  We are closed weekends and major holidays.  You do have access to a nurse 24-7, just call the main number to the clinic [phone] and do not press any  options, hold on the line and a nurse will answer the phone.    For prescription refill requests, have your pharmacy contact our office and allow 72 hours.    Due to Covid, you will need to wear a mask upon entering the hospital. If you do not have a mask, a mask will be given to you at the Main Entrance upon arrival. For doctor visits, patients may have 1 support person age 51 or older with them. For treatment visits, patients can not have anyone with them due to social distancing guidelines and our immunocompromised population.

## 2021-08-05 NOTE — Progress Notes (Signed)
Patient has been examined by Dr. Katragadda, and vital signs and labs have been reviewed. ANC, Creatinine, LFTs, hemoglobin, and platelets are within treatment parameters per M.D. - pt may proceed with treatment.    °

## 2021-08-05 NOTE — Progress Notes (Signed)
Patient presents today for chemotherapy infusion.  Patient is in satisfactory condition with no complaints voiced.  Vital signs are stable.  Labs reviewed by Dr. Delton Coombes during her office visit.  All labs are within treatment parameters.  We will proceed with treatment per MD orders.  Patient tolerated treatment well with no complaints voiced.  Patient will be discharged on home infusion 5FU pump for 46 hours. Patient left ambulatory in stable condition.  Vital signs stable at discharge.  Follow up as scheduled.

## 2021-08-05 NOTE — Progress Notes (Signed)
[place], [place] [address]   CLINIC:  Medical Oncology/Hematology  PCP:  Patient, No Pcp Per (Inactive) None None   REASON FOR VISIT:  Follow-up for metastatic colon cancer to liver  PRIOR THERAPY: Sigmoid colectomy and end colostomy on 04/29/2020  NGS Results: Foundation 1 KRAS wildtype, MS--stable, TMB 4 Muts/Mb  CURRENT THERAPY: FOLFOX, Avastin & Aloxi every 2 weeks  BRIEF ONCOLOGIC HISTORY:  Oncology History  Metastatic colon cancer to liver ([place])  05/16/2020 Initial Diagnosis   Metastatic colon cancer to liver ([place])   06/03/2020 Genetic Testing   Foundation One:     06/04/2020 -  Chemotherapy   Patient is on Treatment Plan : COLORECTAL FOLFOX + Bevacizumab q14d       CANCER STAGING:  Cancer Staging  No matching staging information was found for the patient.  INTERVAL HISTORY:  [name], a 61 y.o. female, returns for routine follow-up and consideration for next cycle of chemotherapy. Susan Martin was last seen on 07/08/2021.  Due for cycle #31 of FOLFOX + Bevacizumab today.   Overall, she tells me she has been feeling pretty well. She denies any recent cough or infection. She reports occasional blood in her mucous after blowing her nose. Her anxiety has improved. She denies abdominal pain.   Overall, she feels ready for next cycle of chemo today.   REVIEW OF SYSTEMS:  Review of Systems  Constitutional:  Negative for appetite change and fatigue.  HENT:   Positive for nosebleeds.   Respiratory:  Negative for cough.   Gastrointestinal:  Negative for abdominal pain.  Psychiatric/Behavioral:  Nervous/anxious: improved.   All other systems reviewed and are negative.  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 Delton Coombes   04-29-2020 [facility] surgery for perforated colon s/p sigmoid colectomy w/ colostomy; dx  Stage IV  colon cancer mets to liver   Anxiety    Depression    Hypertension    followed by dr Glo Herring and oncology  (05-27-2020 per pt does not have pcp yet)   IDA (iron deficiency anemia)    Pulmonary nodule, right    Past Surgical History:  Procedure Laterality Date   ABDOMINAL HYSTERECTOMY  03-31-2016   _0    W/  BILATERAL SALPINOOPHORECTOMY    APPLICATION OF WOUND VAC N/A 04/29/2020   Procedure: APPLICATION OF WOUND VAC;  Surgeon: Mickeal Skinner, MD;  Location: [facility];  Service: General;  Laterality: N/A;   ENDOMETRIAL ABLATION     LAPAROTOMY N/A 04/29/2020   Procedure: EXPLORATORY LAPAROTOMY;  Surgeon: Mickeal Skinner, MD;  Location: [facility];  Service: General;  Laterality: N/A;   PARTIAL COLECTOMY N/A 04/29/2020   Procedure: PARTIAL COLECTOMY WITH END COLOSTOMY;  Surgeon: Kieth Brightly Arta Bruce, MD;  Location: [facility];  Service: General;  Laterality: N/A;   PORTACATH PLACEMENT Right 05/28/2020   Procedure: INSERTION PORT-A-CATH;  Surgeon: Kinsinger, Arta Bruce, MD;  Location: [facility];  Service: General;  Laterality: Right;   SALPINGOOPHORECTOMY Left 03/31/2016   Procedure: LEFT SALPINGO OOPHORECTOMY WITH FROZEN SECTION,  ABDOMINAL HYSTERECTOMY WITH BILATERAL SALPINGO-OOPHORECTOMY;  Surgeon: Jonnie Kind, MD;  Location: [facility];  Service: Gynecology;  Laterality: Left;    SOCIAL HISTORY:  Social History   Socioeconomic History   Marital status: Divorced    Spouse name: Not on file   Number of children: Not on file   Years of  education: Not on file   Highest education level: Not on file  Occupational History   Not on file  Tobacco Use   Smoking status: Former    Packs/day: 0.50    Years: 5.00    Pack years: 2.50    Types: Cigarettes    Quit date: 03/28/2015    Years since quitting: 6.3   Smokeless tobacco: Never  Vaping Use   Vaping Use: Never used  Substance and Sexual Activity   Alcohol use: No   Drug use: Never   Sexual activity: Yes     Partners: Male    Birth control/protection: Surgical  Other Topics Concern   Not on file  Social History Narrative   Not on file   Social Determinants of Health   Financial Resource Strain: Not on file  Food Insecurity: Not on file  Transportation Needs: Not on file  Physical Activity: Not on file  Stress: Not on file  Social Connections: Not on file  Intimate Partner Violence: Not on file    FAMILY HISTORY:  Family History  Problem Relation Age of Onset   Hypertension Mother    Diabetes Mother    Cirrhosis Father     CURRENT MEDICATIONS:  Current Outpatient Medications  Medication Sig Dispense Refill   BEVACIZUMAB IV Inject 5 mg/kg into the vein every 14 (fourteen) days.     fluorouracil CALGB 14481 in sodium chloride 0.9 % 150 mL Inject 2,400 mg/m2 into the vein over 48 hr.     gabapentin (NEURONTIN) 100 MG capsule Take 1 capsule (100 mg total) by mouth 3 (three) times daily. 90 capsule 3   hydrOXYzine (VISTARIL) 25 MG capsule Take 1 capsule (25 mg total) by mouth 3 (three) times daily as needed. Prn anxiety 60 capsule 2   LEUCOVORIN CALCIUM IV Inject 400 mg/m2 into the vein every 21 ( twenty-one) days.     lidocaine-prilocaine (EMLA) cream Apply small amount to port a cath site and cover with plastic wrap 1 hour prior to chemotherapy appointments 30 g 3   LORazepam (ATIVAN) 1 MG tablet Take 1 tablet (1 mg total) by mouth every 8 (eight) hours as needed for anxiety. 90 tablet 1   nystatin (MYCOSTATIN) 100000 UNIT/ML suspension Take by mouth.     PARoxetine (PAXIL) 40 MG tablet Take 1 tablet (40 mg total) by mouth every morning. 90 tablet 3   polyethylene glycol (MIRALAX / GLYCOLAX) packet Take 17 g by mouth every other day.     potassium chloride (KLOR-CON M) 10 MEQ tablet Take 2 tablets (20 mEq total) by mouth daily. 60 tablet 6   prochlorperazine (COMPAZINE) 10 MG tablet Take 1 tablet (10 mg total) by mouth every 6 (six) hours as needed (Nausea or vomiting). 30 tablet 1    triamterene-hydrochlorothiazide (MAXZIDE-25) 37.5-25 MG tablet Take 1 tablet by mouth daily. 30 tablet 3   No current facility-administered medications for this visit.    ALLERGIES:  No Known Allergies  PHYSICAL EXAM:  Performance status (ECOG): 1 - Symptomatic but completely ambulatory  Vitals:   08/05/21 1024  BP: (!) 158/84  Pulse: 80  Resp: 18  Temp: 97.8 F (36.6 C)  SpO2: 98%   Wt Readings from Last 3 Encounters:  08/05/21 164 lb 11.2 oz (74.7 kg)  07/22/21 163 lb 3.2 oz (74 kg)  07/08/21 160 lb 9.6 oz (72.8 kg)   Physical Exam Vitals reviewed.  Constitutional:      Appearance: Normal appearance.  Cardiovascular:  Rate and Rhythm: Normal rate and regular rhythm.     Pulses: Normal pulses.     Heart sounds: Normal heart sounds.  Pulmonary:     Effort: Pulmonary effort is normal.     Breath sounds: Normal breath sounds.  Musculoskeletal:     Right lower leg: No edema.     Left lower leg: No edema.  Neurological:     General: No focal deficit present.     Mental Status: She is alert and oriented to person, place, and time.  Psychiatric:        Mood and Affect: Mood normal.        Behavior: Behavior normal.    LABORATORY DATA:  I have reviewed the labs as listed.  CBC Latest Ref Rng & Units 08/04/2021 07/22/2021 07/08/2021  WBC 4.0 - 10.5 K/uL 6.3 5.1 4.5  Hemoglobin 12.0 - 15.0 g/dL 13.5 13.8 14.3  Hematocrit 36.0 - 46.0 % 40.5 39.9 41.5  Platelets 150 - 400 K/uL 118(L) 103(L) 119(L)   CMP Latest Ref Rng & Units 08/04/2021 07/22/2021 07/08/2021  Glucose 70 - 99 mg/dL 113(H) 133(H) 125(H)  BUN 8 - 23 mg/dL _0 Creatinine 0.44 - 1.00 mg/dL 0.96 0.97 0.95  Sodium 135 - 145 mmol/L 134(L) 134(L) 137  Potassium 3.5 - 5.1 mmol/L 3.7 3.1(L) 3.4(L)  Chloride 98 - 111 mmol/L 100 100 102  CO2 22 - 32 mmol/L _1 Calcium 8.9 - 10.3 mg/dL 9.1 9.1 9.0  Total Protein 6.5 - 8.1 g/dL 7.4 7.2 7.3  Total Bilirubin 0.3 - 1.2 mg/dL 0.6 0.5 0.8  Alkaline Phos 38 -  126 U/L 83 94 90  AST 15 - 41 U/L 38 45(H) 38  ALT 0 - 44 U/L 38 46(H) 39    DIAGNOSTIC IMAGING:  I have independently reviewed the scans and discussed with the patient. CT CHEST ABDOMEN PELVIS W CONTRAST  Result Date: 08/05/2021 CLINICAL DATA:  Follow-up metastatic colon cancer EXAM: CT CHEST, ABDOMEN, AND PELVIS WITH CONTRAST TECHNIQUE: Multidetector CT imaging of the chest, abdomen and pelvis was performed following the standard protocol during bolus administration of intravenous contrast. RADIATION DOSE REDUCTION: This exam was performed according to the departmental dose-optimization program which includes automated exposure control, adjustment of the mA and/or kV according to patient size and/or use of iterative reconstruction technique. CONTRAST:  167m OMNIPAQUE IOHEXOL 300 MG/ML  SOLN COMPARISON:  CT chest, abdomen and pelvis dated May 05, 2021 FINDINGS: CT CHEST FINDINGS Cardiovascular: Normal heart size. No pericardial effusion. No coronary artery calcifications. No suspicious filling defects of the pulmonary arteries. Mild atherosclerotic disease of the thoracic aorta. Right chest wall port with tip in the lower SVC. Mediastinum/Nodes: Esophagus is unremarkable. No pathologically enlarged lymph nodes seen in the chest. Lungs/Pleura: Central airways are patent. No consolidation, pleural effusion or pneumothorax. New ground-glass opacity of the right upper lobe measuring 10 mm on series 3, image 60, and new left lower lobe ground-glass opacities located on on image 79 and 93 likely infectious or inflammatory. Stable right middle lobe solid pulmonary nodule measuring 4 mm on series 3, image 94. Stable juxtapleural solid nodule of the left lower lobe measuring 9 mm on series 3, image 74. No consolidation, pleural effusion or pneumothorax. Musculoskeletal: No chest wall mass or suspicious bone lesions identified. CT ABDOMEN PELVIS FINDINGS Hepatobiliary: Numerous ill-defined liver lesions which  are stable when compared with prior exam. Reference lesion of the right hepatic lobe measuring 2.2 x 1.8 cm  on series 2, image 58. Pancreas: Unremarkable. No pancreatic ductal dilatation or surrounding inflammatory changes. Spleen: Normal in size without focal abnormality. Adrenals/Urinary Tract: Adrenal glands are unremarkable. Kidneys are normal, without renal calculi, focal lesion, or hydronephrosis. Bladder is unremarkable. Stomach/Bowel: Prior sigmoid resection with left lower quadrant colostomy. Similar soft tissue stranding is seen in the pelvis at the sigmoid colon resection site which is likely postsurgical. Stomach and small bowel are unremarkable. Vascular/Lymphatic: Aortic atherosclerosis. No enlarged abdominal or pelvic lymph nodes. Reproductive: Status post hysterectomy. No adnexal masses. Other: No abdominopelvic ascites. Musculoskeletal: No acute or significant osseous findings. IMPRESSION: 1. Stable hepatic metastatic disease. 2. New ground-glass nodular opacities of the right upper lobe and left lower lobe which are likely infectious or inflammatory. Recommend attention on follow-up. 3. Status post sigmoid colon resection with left lower quadrant colostomy. 4.  Aortic Atherosclerosis (ICD10-I70.0). Electronically Signed   By: Yetta Glassman M.D.   On: 08/05/2021 10:17     ASSESSMENT:  1.  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 -CT chest on 05/02/2020 with no evidence of pulmonary metastatic disease. -CTAP on 05/07/2020 with multiple lesions throughout the liver, largest in the right lobe measuring 3.9 cm, lateral segment left lobe measuring 2.6 cm and inferior right lobe confluent lesion 4.2 x 3.7 cm.  No lymphadenopathy. -CEA on 05/02/2020-60.2. -FOLFOX and  bevacizumab started on 06/04/2020. 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   2.  Iron deficiency anemia: -Feraheme on 04/30/2020 and 05/06/2020.   3.  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 -Smoked for 3 to 4 years and quit. -Mother had cancer, type unknown.  Maternal uncle had lung cancer.  Maternal aunt had lung cancer.   PLAN:  1.  Metastatic colon adenocarcinoma to liver, MS-stable: - We have reviewed CT CAP from 08/04/2021 which showed stable hepatic metastatic disease.  Minor groundglass nodular opacities in the right upper lobe and left lower lobe could be inflammatory. - Reviewed labs which showed CEA was 2.2.  LFTs are normal. - We will continue 5-FU infusional with bevacizumab every 2 weeks.  RTC 4 weeks with labs and treatment.   2.  Hypertension: - She is taking triamterene/HCTZ 37.5/25 mg daily.  Blood pressure today is 160/92 on repeat testing. - We will add Norvasc 5 mg daily.   3.  Severe hypokalemia: - Continue potassium 20 mEq daily.  Potassium is 3.7.   4.  Peripheral neuropathy: -Continue gabapentin 100 mg in the mornings.   5.  Anxiety: - Continue Paxil and Ativan 1 mg every 8 hours as needed.   Orders placed this encounter:  No orders of the defined types were placed in this encounter.    Derek Jack, MD [place] [phone]   I, Thana Ates, am acting as a scribe for Dr. Derek Jack.  I, Derek Jack MD, have reviewed the above documentation for accuracy and completeness, and I agree with the above.

## 2021-08-07 ENCOUNTER — Inpatient Hospital Stay (HOSPITAL_COMMUNITY): Payer: Medicaid Other

## 2021-08-07 ENCOUNTER — Other Ambulatory Visit: Payer: Self-pay

## 2021-08-07 VITALS — BP 152/83 | HR 75 | Temp 99.3°F | Resp 18

## 2021-08-07 DIAGNOSIS — C189 Malignant neoplasm of colon, unspecified: Secondary | ICD-10-CM

## 2021-08-07 DIAGNOSIS — Z5111 Encounter for antineoplastic chemotherapy: Secondary | ICD-10-CM | POA: Diagnosis not present

## 2021-08-07 MED ORDER — HEPARIN SOD (PORK) LOCK FLUSH 100 UNIT/ML IV SOLN
500.0000 [IU] | Freq: Once | INTRAVENOUS | Status: AC | PRN
  Administered 2021-08-07: 500 [IU]

## 2021-08-07 MED ORDER — SODIUM CHLORIDE 0.9% FLUSH
10.0000 mL | INTRAVENOUS | Status: DC | PRN
  Administered 2021-08-07: 10 mL

## 2021-08-07 NOTE — Patient Instructions (Signed)
[facility]  Discharge Instructions: Thank you for choosing [facility] to provide your oncology and hematology care.  If you have a lab appointment with the Cancer Center, please come in thru the Main Entrance and check in at the main information desk.  Wear comfortable clothing and clothing appropriate for easy access to any Portacath or PICC line.   We strive to give you quality time with your provider. You may need to reschedule your appointment if you arrive late (15 or more minutes).  Arriving late affects you and other patients whose appointments are after yours.  Also, if you miss three or more appointments without notifying the office, you may be dismissed from the clinic at the provider's discretion.      For prescription refill requests, have your pharmacy contact our office and allow 72 hours for refills to be completed.        To help prevent nausea and vomiting after your treatment, we encourage you to take your nausea medication as directed.  BELOW ARE SYMPTOMS THAT SHOULD BE REPORTED IMMEDIATELY: *FEVER GREATER THAN 100.4 F (38 C) OR HIGHER *CHILLS OR SWEATING *NAUSEA AND VOMITING THAT IS NOT CONTROLLED WITH YOUR NAUSEA MEDICATION *UNUSUAL SHORTNESS OF BREATH *UNUSUAL BRUISING OR BLEEDING *URINARY PROBLEMS (pain or burning when urinating, or frequent urination) *BOWEL PROBLEMS (unusual diarrhea, constipation, pain near the anus) TENDERNESS IN MOUTH AND THROAT WITH OR WITHOUT PRESENCE OF ULCERS (sore throat, sores in mouth, or a toothache) UNUSUAL RASH, SWELLING OR PAIN  UNUSUAL VAGINAL DISCHARGE OR ITCHING   Items with * indicate a potential emergency and should be followed up as soon as possible or go to the Emergency Department if any problems should occur.  Please show the CHEMOTHERAPY ALERT CARD or IMMUNOTHERAPY ALERT CARD at check-in to the Emergency Department and triage nurse.  Should you have questions after your visit or need to cancel  or reschedule your appointment, please contact [facility] [phone]  and follow the prompts.  Office hours are 8:00 a.m. to 4:30 p.m. Monday - Friday. Please note that voicemails left after 4:00 p.m. may not be returned until the following business day.  We are closed weekends and major holidays. You have access to a nurse at all times for urgent questions. Please call the main number to the clinic [phone] and follow the prompts.  For any non-urgent questions, you may also contact your provider using MyChart. We now offer e-Visits for anyone 18 and older to request care online for non-urgent symptoms. For details visit [email or web address].   Also download the MyChart app! Go to the app store, search "MyChart", open the app, select [facility], and log in with your MyChart username and password.  Due to Covid, a mask is required upon entering the hospital/clinic. If you do not have a mask, one will be given to you upon arrival. For doctor visits, patients may have 1 support person aged 18 or older with them. For treatment visits, patients cannot have anyone with them due to current Covid guidelines and our immunocompromised population.  

## 2021-08-07 NOTE — Progress Notes (Signed)
Patient presents today for 5FU home infusion pump d/c.  Patient is in satisfactory condition with no complaints voiced.  Vital signs are stable.    Home infusion 5FU pump removed without any difficulty.  Patient tolerated well.  Patient left ambulatory in stable condition.

## 2021-08-19 ENCOUNTER — Ambulatory Visit (HOSPITAL_COMMUNITY): Payer: Medicaid Other | Admitting: Hematology

## 2021-08-19 ENCOUNTER — Inpatient Hospital Stay (HOSPITAL_COMMUNITY): Payer: Medicaid Other

## 2021-08-19 ENCOUNTER — Other Ambulatory Visit: Payer: Self-pay

## 2021-08-19 ENCOUNTER — Encounter (HOSPITAL_COMMUNITY): Payer: Self-pay

## 2021-08-19 ENCOUNTER — Other Ambulatory Visit (HOSPITAL_COMMUNITY): Payer: Self-pay | Admitting: *Deleted

## 2021-08-19 VITALS — BP 145/75 | HR 81 | Temp 99.0°F | Resp 18

## 2021-08-19 DIAGNOSIS — C787 Secondary malignant neoplasm of liver and intrahepatic bile duct: Secondary | ICD-10-CM

## 2021-08-19 DIAGNOSIS — C189 Malignant neoplasm of colon, unspecified: Secondary | ICD-10-CM

## 2021-08-19 DIAGNOSIS — Z5111 Encounter for antineoplastic chemotherapy: Secondary | ICD-10-CM | POA: Diagnosis not present

## 2021-08-19 DIAGNOSIS — Z95828 Presence of other vascular implants and grafts: Secondary | ICD-10-CM

## 2021-08-19 LAB — CBC WITH DIFFERENTIAL/PLATELET
Abs Immature Granulocytes: 0 10*3/uL (ref 0.00–0.07)
Basophils Absolute: 0 10*3/uL (ref 0.0–0.1)
Basophils Relative: 1 %
Eosinophils Absolute: 0.1 10*3/uL (ref 0.0–0.5)
Eosinophils Relative: 2 %
HCT: 40.4 % (ref 36.0–46.0)
Hemoglobin: 14.1 g/dL (ref 12.0–15.0)
Immature Granulocytes: 0 %
Lymphocytes Relative: 27 %
Lymphs Abs: 1.2 10*3/uL (ref 0.7–4.0)
MCH: 32.3 pg (ref 26.0–34.0)
MCHC: 34.9 g/dL (ref 30.0–36.0)
MCV: 92.7 fL (ref 80.0–100.0)
Monocytes Absolute: 0.6 10*3/uL (ref 0.1–1.0)
Monocytes Relative: 15 %
Neutro Abs: 2.4 10*3/uL (ref 1.7–7.7)
Neutrophils Relative %: 55 %
Platelets: 105 10*3/uL — ABNORMAL LOW (ref 150–400)
RBC: 4.36 MIL/uL (ref 3.87–5.11)
RDW: 16.7 % — ABNORMAL HIGH (ref 11.5–15.5)
WBC: 4.2 10*3/uL (ref 4.0–10.5)
nRBC: 0 % (ref 0.0–0.2)

## 2021-08-19 LAB — COMPREHENSIVE METABOLIC PANEL
ALT: 35 U/L (ref 0–44)
AST: 35 U/L (ref 15–41)
Albumin: 3.9 g/dL (ref 3.5–5.0)
Alkaline Phosphatase: 86 U/L (ref 38–126)
Anion gap: 8 (ref 5–15)
BUN: 13 mg/dL (ref 8–23)
CO2: 30 mmol/L (ref 22–32)
Calcium: 9.3 mg/dL (ref 8.9–10.3)
Chloride: 98 mmol/L (ref 98–111)
Creatinine, Ser: 0.91 mg/dL (ref 0.44–1.00)
GFR, Estimated: >60 mL/min (ref >60)
Glucose, Bld: 113 mg/dL — ABNORMAL HIGH (ref 70–99)
Potassium: 3.5 mmol/L (ref 3.5–5.1)
Sodium: 136 mmol/L (ref 135–145)
Total Bilirubin: 1 mg/dL (ref 0.3–1.2)
Total Protein: 7.4 g/dL (ref 6.5–8.1)

## 2021-08-19 LAB — MAGNESIUM: Magnesium: 2 mg/dL (ref 1.7–2.4)

## 2021-08-19 MED ORDER — ALTEPLASE 2 MG IJ SOLR
2.0000 mg | Freq: Once | INTRAMUSCULAR | Status: AC
  Administered 2021-08-19: 2 mg
  Filled 2021-08-19: qty 2

## 2021-08-19 MED ORDER — SODIUM CHLORIDE 0.9 % IV SOLN
5.0000 mg/kg | Freq: Once | INTRAVENOUS | Status: AC
  Administered 2021-08-19: 400 mg via INTRAVENOUS
  Filled 2021-08-19: qty 16

## 2021-08-19 MED ORDER — SODIUM CHLORIDE 0.9 % IV SOLN
1920.0000 mg/m2 | INTRAVENOUS | Status: DC
  Administered 2021-08-19: 3250 mg via INTRAVENOUS
  Filled 2021-08-19: qty 65

## 2021-08-19 MED ORDER — PALONOSETRON HCL INJECTION 0.25 MG/5ML
0.2500 mg | Freq: Once | INTRAVENOUS | Status: AC
  Administered 2021-08-19: 0.25 mg via INTRAVENOUS
  Filled 2021-08-19: qty 5

## 2021-08-19 MED ORDER — LIDOCAINE VISCOUS HCL 2 % MT SOLN: 15.0000 mL | Freq: Four times a day (QID) | OROMUCOSAL | 3 refills | Status: AC | PRN

## 2021-08-19 MED ORDER — SODIUM CHLORIDE 0.9 % IV SOLN: Freq: Once | INTRAVENOUS | Status: AC

## 2021-08-19 MED ORDER — FLUOROURACIL CHEMO INJECTION 2.5 GM/50ML
320.0000 mg/m2 | Freq: Once | INTRAVENOUS | Status: AC
  Administered 2021-08-19: 550 mg via INTRAVENOUS
  Filled 2021-08-19: qty 11

## 2021-08-19 MED ORDER — SODIUM CHLORIDE 0.9 % IV SOLN
10.0000 mg | Freq: Once | INTRAVENOUS | Status: AC
  Administered 2021-08-19: 10 mg via INTRAVENOUS
  Filled 2021-08-19: qty 10

## 2021-08-19 MED ORDER — SODIUM CHLORIDE 0.9 % IV SOLN
400.0000 mg/m2 | Freq: Once | INTRAVENOUS | Status: AC
  Administered 2021-08-19: 680 mg via INTRAVENOUS
  Filled 2021-08-19: qty 34

## 2021-08-19 NOTE — Progress Notes (Signed)
Patient presents today for treatment. Vital signs within parameters for today's treatment. Blood pressure on arrival 151/84. Patient has complaints today of sores in her mouth that she wants pain medication . Patient states she has tried to use "magic mouth wash" with no success. Patient states after treatment on day 3 she starts to get sores . Upon assessment mouth cracked on each side bilateral. No sores noted inside patient's mouth. Patient able to eat and drink. Labs drawn peripheral. Port a cath gives scant amount of blood return. Alteplase 2mg  placed in port a cath.    Labs within parameters for today's treatment.   Treatment given today per MD orders. Tolerated infusion without adverse affects. Vital signs stable. No complaints at this time. Discharged from clinic ambulatory in stable condition. Alert and oriented x 3. F/U with [facility] as scheduled.

## 2021-08-19 NOTE — Patient Instructions (Signed)
[facility]  Discharge Instructions: Thank you for choosing [facility] to provide your oncology and hematology care.  If you have a lab appointment with the [facility], please come in thru the Main Entrance and check in at the main information desk.  Wear comfortable clothing and clothing appropriate for easy access to any Portacath or PICC line.   We strive to give you quality time with your provider. You may need to reschedule your appointment if you arrive late (15 or more minutes).  Arriving late affects you and other patients whose appointments are after yours.  Also, if you miss three or more appointments without notifying the office, you may be dismissed from the clinic at the providers discretion.      For prescription refill requests, have your pharmacy contact our office and allow 72 hours for refills to be completed.    Today you received the following chemotherapy and/or immunotherapy agents Avastin, Leucovorin, 5FU. The chemotherapy medication bag should finish at 46 hours, 96 hours, or 7 days. For example, if your pump is scheduled for 46 hours and it was put on at 4:00 p.m., it should finish at 2:00 p.m. the day it is scheduled to come off regardless of your appointment time.     Estimated time to finish at 1304 pm. .   If the display on your pump reads "Low Volume" and it is beeping, take the batteries out of the pump and come to the cancer center for it to be taken off.   If the pump alarms go off prior to the pump reading "Low Volume" then call [phone] and someone can assist you.  If the plunger comes out and the chemotherapy medication is leaking out, please use your home chemo spill kit to clean up the spill. Do NOT use paper towels or other household products.  If you have problems or questions regarding your pump, please call either [phone] (24 hours a day) or the cancer center Monday-Friday 8:00 a.m.- 4:30 p.m. at the clinic  number and we will assist you. If you are unable to get assistance, then go to the nearest Emergency Department and ask the staff to contact the IV team for assistance.        To help prevent nausea and vomiting after your treatment, we encourage you to take your nausea medication as directed.  BELOW ARE SYMPTOMS THAT SHOULD BE REPORTED IMMEDIATELY: *FEVER GREATER THAN 100.4 F (38 C) OR HIGHER *CHILLS OR SWEATING *NAUSEA AND VOMITING THAT IS NOT CONTROLLED WITH YOUR NAUSEA MEDICATION *UNUSUAL SHORTNESS OF BREATH *UNUSUAL BRUISING OR BLEEDING *URINARY PROBLEMS (pain or burning when urinating, or frequent urination) *BOWEL PROBLEMS (unusual diarrhea, constipation, pain near the anus) TENDERNESS IN MOUTH AND THROAT WITH OR WITHOUT PRESENCE OF ULCERS (sore throat, sores in mouth, or a toothache) UNUSUAL RASH, SWELLING OR PAIN  UNUSUAL VAGINAL DISCHARGE OR ITCHING   Items with * indicate a potential emergency and should be followed up as soon as possible or go to the Emergency Department if any problems should occur.  Please show the CHEMOTHERAPY ALERT CARD or IMMUNOTHERAPY ALERT CARD at check-in to the Emergency Department and triage nurse.  Should you have questions after your visit or need to cancel or reschedule your appointment, please contact [facility] [phone]  and follow the prompts.  Office hours are 8:00 a.m. to 4:30 p.m. Monday - Friday. Please note that voicemails left after 4:00 p.m. may not be returned until  the following business day.  We are closed weekends and major holidays. You have access to a nurse at all times for urgent questions. Please call the main number to the clinic [phone] and follow the prompts.  For any non-urgent questions, you may also contact your provider using MyChart. We now offer e-Visits for anyone 55 and older to request care online for non-urgent symptoms. For details visit [email or web address].   Also download the MyChart app!  Go to the app store, search "MyChart", open the app, select [facility], and log in with your MyChart username and password.  Due to Covid, a mask is required upon entering the hospital/clinic. If you do not have a mask, one will be given to you upon arrival. For doctor visits, patients may have 1 support person aged 31 or older with them. For treatment visits, patients cannot have anyone with them due to current Covid guidelines and our immunocompromised population.

## 2021-08-21 ENCOUNTER — Other Ambulatory Visit: Payer: Self-pay

## 2021-08-21 ENCOUNTER — Other Ambulatory Visit (HOSPITAL_COMMUNITY): Payer: Self-pay

## 2021-08-21 ENCOUNTER — Inpatient Hospital Stay (HOSPITAL_COMMUNITY): Payer: Medicaid Other

## 2021-08-21 ENCOUNTER — Encounter (HOSPITAL_COMMUNITY): Payer: Self-pay

## 2021-08-21 VITALS — BP 157/86 | HR 83 | Temp 98.7°F | Resp 18

## 2021-08-21 DIAGNOSIS — F419 Anxiety disorder, unspecified: Secondary | ICD-10-CM

## 2021-08-21 DIAGNOSIS — C189 Malignant neoplasm of colon, unspecified: Secondary | ICD-10-CM

## 2021-08-21 DIAGNOSIS — Z5111 Encounter for antineoplastic chemotherapy: Secondary | ICD-10-CM | POA: Diagnosis not present

## 2021-08-21 MED ORDER — LORAZEPAM 1 MG PO TABS: 1.0000 mg | ORAL_TABLET | Freq: Three times a day (TID) | ORAL | 3 refills | Status: DC | PRN

## 2021-08-21 MED ORDER — SODIUM CHLORIDE 0.9% FLUSH
10.0000 mL | INTRAVENOUS | Status: DC | PRN
  Administered 2021-08-21: 10 mL

## 2021-08-21 MED ORDER — HEPARIN SOD (PORK) LOCK FLUSH 100 UNIT/ML IV SOLN
500.0000 [IU] | Freq: Once | INTRAVENOUS | Status: AC | PRN
  Administered 2021-08-21: 500 [IU]

## 2021-08-21 NOTE — Progress Notes (Signed)
Patients port flushed without difficulty.  Good blood return noted with no bruising or swelling noted at site.  Band aid applied.  VSS with discharge and left in satisfactory condition with no s/s of distress noted.   

## 2021-08-21 NOTE — Patient Instructions (Signed)
[facility]  Discharge Instructions: Thank you for choosing [facility] to provide your oncology and hematology care.  If you have a lab appointment with the Cancer Center, please come in thru the Main Entrance and check in at the main information desk.  Wear comfortable clothing and clothing appropriate for easy access to any Portacath or PICC line.   We strive to give you quality time with your provider. You may need to reschedule your appointment if you arrive late (15 or more minutes).  Arriving late affects you and other patients whose appointments are after yours.  Also, if you miss three or more appointments without notifying the office, you may be dismissed from the clinic at the provider's discretion.      For prescription refill requests, have your pharmacy contact our office and allow 72 hours for refills to be completed.        To help prevent nausea and vomiting after your treatment, we encourage you to take your nausea medication as directed.  BELOW ARE SYMPTOMS THAT SHOULD BE REPORTED IMMEDIATELY: *FEVER GREATER THAN 100.4 F (38 C) OR HIGHER *CHILLS OR SWEATING *NAUSEA AND VOMITING THAT IS NOT CONTROLLED WITH YOUR NAUSEA MEDICATION *UNUSUAL SHORTNESS OF BREATH *UNUSUAL BRUISING OR BLEEDING *URINARY PROBLEMS (pain or burning when urinating, or frequent urination) *BOWEL PROBLEMS (unusual diarrhea, constipation, pain near the anus) TENDERNESS IN MOUTH AND THROAT WITH OR WITHOUT PRESENCE OF ULCERS (sore throat, sores in mouth, or a toothache) UNUSUAL RASH, SWELLING OR PAIN  UNUSUAL VAGINAL DISCHARGE OR ITCHING   Items with * indicate a potential emergency and should be followed up as soon as possible or go to the Emergency Department if any problems should occur.  Please show the CHEMOTHERAPY ALERT CARD or IMMUNOTHERAPY ALERT CARD at check-in to the Emergency Department and triage nurse.  Should you have questions after your visit or need to cancel  or reschedule your appointment, please contact [facility] [phone]  and follow the prompts.  Office hours are 8:00 a.m. to 4:30 p.m. Monday - Friday. Please note that voicemails left after 4:00 p.m. may not be returned until the following business day.  We are closed weekends and major holidays. You have access to a nurse at all times for urgent questions. Please call the main number to the clinic [phone] and follow the prompts.  For any non-urgent questions, you may also contact your provider using MyChart. We now offer e-Visits for anyone 18 and older to request care online for non-urgent symptoms. For details visit [email or web address].   Also download the MyChart app! Go to the app store, search "MyChart", open the app, select [facility], and log in with your MyChart username and password.  Due to Covid, a mask is required upon entering the hospital/clinic. If you do not have a mask, one will be given to you upon arrival. For doctor visits, patients may have 1 support person aged 18 or older with them. For treatment visits, patients cannot have anyone with them due to current Covid guidelines and our immunocompromised population.  

## 2021-08-22 ENCOUNTER — Other Ambulatory Visit (HOSPITAL_COMMUNITY): Payer: Self-pay | Admitting: Hematology

## 2021-08-22 DIAGNOSIS — F419 Anxiety disorder, unspecified: Secondary | ICD-10-CM

## 2021-08-25 ENCOUNTER — Encounter (HOSPITAL_COMMUNITY): Payer: Self-pay | Admitting: Hematology

## 2021-08-25 ENCOUNTER — Other Ambulatory Visit (HOSPITAL_COMMUNITY): Payer: Self-pay | Admitting: Hematology

## 2021-09-02 ENCOUNTER — Inpatient Hospital Stay (HOSPITAL_COMMUNITY): Payer: Medicaid Other

## 2021-09-02 ENCOUNTER — Other Ambulatory Visit: Payer: Self-pay

## 2021-09-02 ENCOUNTER — Inpatient Hospital Stay (HOSPITAL_COMMUNITY): Payer: Medicaid Other | Attending: Hematology

## 2021-09-02 ENCOUNTER — Inpatient Hospital Stay (HOSPITAL_BASED_OUTPATIENT_CLINIC_OR_DEPARTMENT_OTHER): Payer: Medicaid Other | Admitting: Hematology

## 2021-09-02 VITALS — BP 145/86 | HR 81 | Temp 98.1°F | Resp 17

## 2021-09-02 VITALS — BP 146/88 | HR 82 | Temp 97.9°F | Resp 18 | Ht 63.0 in | Wt 164.9 lb

## 2021-09-02 DIAGNOSIS — R911 Solitary pulmonary nodule: Secondary | ICD-10-CM | POA: Insufficient documentation

## 2021-09-02 DIAGNOSIS — C189 Malignant neoplasm of colon, unspecified: Secondary | ICD-10-CM

## 2021-09-02 DIAGNOSIS — I10 Essential (primary) hypertension: Secondary | ICD-10-CM | POA: Insufficient documentation

## 2021-09-02 DIAGNOSIS — E876 Hypokalemia: Secondary | ICD-10-CM | POA: Diagnosis not present

## 2021-09-02 DIAGNOSIS — C787 Secondary malignant neoplasm of liver and intrahepatic bile duct: Secondary | ICD-10-CM | POA: Diagnosis not present

## 2021-09-02 DIAGNOSIS — I700 Atherosclerosis of aorta: Secondary | ICD-10-CM | POA: Diagnosis not present

## 2021-09-02 DIAGNOSIS — G629 Polyneuropathy, unspecified: Secondary | ICD-10-CM | POA: Diagnosis not present

## 2021-09-02 DIAGNOSIS — K1230 Oral mucositis (ulcerative), unspecified: Secondary | ICD-10-CM | POA: Insufficient documentation

## 2021-09-02 DIAGNOSIS — C187 Malignant neoplasm of sigmoid colon: Secondary | ICD-10-CM | POA: Insufficient documentation

## 2021-09-02 DIAGNOSIS — D509 Iron deficiency anemia, unspecified: Secondary | ICD-10-CM | POA: Diagnosis not present

## 2021-09-02 DIAGNOSIS — Z87891 Personal history of nicotine dependence: Secondary | ICD-10-CM | POA: Diagnosis not present

## 2021-09-02 DIAGNOSIS — Z5111 Encounter for antineoplastic chemotherapy: Secondary | ICD-10-CM | POA: Insufficient documentation

## 2021-09-02 DIAGNOSIS — Z79899 Other long term (current) drug therapy: Secondary | ICD-10-CM | POA: Diagnosis not present

## 2021-09-02 DIAGNOSIS — F419 Anxiety disorder, unspecified: Secondary | ICD-10-CM | POA: Diagnosis not present

## 2021-09-02 LAB — COMPREHENSIVE METABOLIC PANEL
ALT: 25 U/L (ref 0–44)
AST: 30 U/L (ref 15–41)
Albumin: 3.8 g/dL (ref 3.5–5.0)
Alkaline Phosphatase: 77 U/L (ref 38–126)
Anion gap: 8 (ref 5–15)
BUN: 16 mg/dL (ref 8–23)
CO2: 29 mmol/L (ref 22–32)
Calcium: 9 mg/dL (ref 8.9–10.3)
Chloride: 98 mmol/L (ref 98–111)
Creatinine, Ser: 0.83 mg/dL (ref 0.44–1.00)
GFR, Estimated: >60 mL/min (ref >60)
Glucose, Bld: 110 mg/dL — ABNORMAL HIGH (ref 70–99)
Potassium: 3.4 mmol/L — ABNORMAL LOW (ref 3.5–5.1)
Sodium: 135 mmol/L (ref 135–145)
Total Bilirubin: 1 mg/dL (ref 0.3–1.2)
Total Protein: 7.6 g/dL (ref 6.5–8.1)

## 2021-09-02 LAB — CBC WITH DIFFERENTIAL/PLATELET
Abs Immature Granulocytes: 0.01 10*3/uL (ref 0.00–0.07)
Basophils Absolute: 0 10*3/uL (ref 0.0–0.1)
Basophils Relative: 1 %
Eosinophils Absolute: 0.1 10*3/uL (ref 0.0–0.5)
Eosinophils Relative: 2 %
HCT: 39.5 % (ref 36.0–46.0)
Hemoglobin: 13.4 g/dL (ref 12.0–15.0)
Immature Granulocytes: 0 %
Lymphocytes Relative: 31 %
Lymphs Abs: 1.5 10*3/uL (ref 0.7–4.0)
MCH: 32.2 pg (ref 26.0–34.0)
MCHC: 33.9 g/dL (ref 30.0–36.0)
MCV: 95 fL (ref 80.0–100.0)
Monocytes Absolute: 0.7 10*3/uL (ref 0.1–1.0)
Monocytes Relative: 13 %
Neutro Abs: 2.7 10*3/uL (ref 1.7–7.7)
Neutrophils Relative %: 53 %
Platelets: 117 10*3/uL — ABNORMAL LOW (ref 150–400)
RBC: 4.16 MIL/uL (ref 3.87–5.11)
RDW: 16 % — ABNORMAL HIGH (ref 11.5–15.5)
WBC: 5 10*3/uL (ref 4.0–10.5)
nRBC: 0 % (ref 0.0–0.2)

## 2021-09-02 LAB — URINALYSIS, DIPSTICK ONLY
Bilirubin Urine: NEGATIVE
Glucose, UA: NEGATIVE mg/dL
Hgb urine dipstick: NEGATIVE
Ketones, ur: NEGATIVE mg/dL
Leukocytes,Ua: NEGATIVE
Nitrite: NEGATIVE
Protein, ur: NEGATIVE mg/dL
Specific Gravity, Urine: 1.013 (ref 1.005–1.030)
pH: 7 (ref 5.0–8.0)

## 2021-09-02 LAB — MAGNESIUM: Magnesium: 2.1 mg/dL (ref 1.7–2.4)

## 2021-09-02 MED ORDER — SODIUM CHLORIDE 0.9 % IV SOLN
400.0000 mg/m2 | Freq: Once | INTRAVENOUS | Status: AC
  Administered 2021-09-02: 680 mg via INTRAVENOUS
  Filled 2021-09-02: qty 34

## 2021-09-02 MED ORDER — SODIUM CHLORIDE 0.9 % IV SOLN
5.0000 mg/kg | Freq: Once | INTRAVENOUS | Status: AC
  Administered 2021-09-02: 400 mg via INTRAVENOUS
  Filled 2021-09-02: qty 16

## 2021-09-02 MED ORDER — SODIUM CHLORIDE 0.9 % IV SOLN
1920.0000 mg/m2 | INTRAVENOUS | Status: DC
  Administered 2021-09-02: 3250 mg via INTRAVENOUS
  Filled 2021-09-02: qty 65

## 2021-09-02 MED ORDER — PALONOSETRON HCL INJECTION 0.25 MG/5ML
0.2500 mg | Freq: Once | INTRAVENOUS | Status: AC
  Administered 2021-09-02: 0.25 mg via INTRAVENOUS
  Filled 2021-09-02: qty 5

## 2021-09-02 MED ORDER — SODIUM CHLORIDE 0.9 % IV SOLN: Freq: Once | INTRAVENOUS | Status: AC

## 2021-09-02 MED ORDER — SODIUM CHLORIDE 0.9% FLUSH
10.0000 mL | INTRAVENOUS | Status: DC | PRN
  Administered 2021-09-02: 10 mL

## 2021-09-02 MED ORDER — FLUOROURACIL CHEMO INJECTION 2.5 GM/50ML
320.0000 mg/m2 | Freq: Once | INTRAVENOUS | Status: AC
  Administered 2021-09-02: 550 mg via INTRAVENOUS
  Filled 2021-09-02: qty 11

## 2021-09-02 MED ORDER — SODIUM CHLORIDE 0.9 % IV SOLN
10.0000 mg | Freq: Once | INTRAVENOUS | Status: AC
  Administered 2021-09-02: 10 mg via INTRAVENOUS
  Filled 2021-09-02: qty 10

## 2021-09-02 NOTE — Patient Instructions (Addendum)
[place] at [facility] ?Discharge Instructions ? ?You were seen and examined today by Dr. Delton Coombes. ? ?He reviewed the results of your lab work which is normal/stable.  ? ?We will proceed with your treatment today. ? ?Use Vasoline in the corners of your mouth to help prevent pain and cracking.  Continue to rinse your mouth after meals and at bedtime. ? ?Return as scheduled.  ? ? ? ?Thank you for choosing [place] at [facility] to provide your oncology and hematology care.  To afford each patient quality time with our provider, please arrive at least 15 minutes before your scheduled appointment time.  ? ?If you have a lab appointment with the [facility] please come in thru the Main Entrance and check in at the main information desk. ? ?You need to re-schedule your appointment should you arrive 10 or more minutes late.  We strive to give you quality time with our providers, and arriving late affects you and other patients whose appointments are after yours.  Also, if you no show three or more times for appointments you may be dismissed from the clinic at the providers discretion.     ?Again, thank you for choosing [facility].  Our hope is that these requests will decrease the amount of time that you wait before being seen by our physicians.       ?_____________________________________________________________ ? ?Should you have questions after your visit to [facility], please contact our office at [phone] and follow the prompts.  Our office hours are 8:00 a.m. and 4:30 p.m. Monday - Friday.  Please note that voicemails left after 4:00 p.m. may not be returned until the following business day.  We are closed weekends and major holidays.  You do have access to a nurse 24-7, just call the main number to the clinic [phone] and do not press any options, hold on the line and a nurse will answer the phone.   ? ?For prescription  refill requests, have your pharmacy contact our office and allow 72 hours.   ? ?Due to Covid, you will need to wear a mask upon entering the hospital. If you do not have a mask, a mask will be given to you at the Main Entrance upon arrival. For doctor visits, patients may have 1 support person age 13 or older with them. For treatment visits, patients can not have anyone with them due to social distancing guidelines and our immunocompromised population.  ? ?

## 2021-09-02 NOTE — Progress Notes (Signed)
Patient has been examined by Dr. Katragadda, and vital signs and labs have been reviewed. ANC, Creatinine, LFTs, hemoglobin, and platelets are within treatment parameters per M.D. - pt may proceed with treatment.    °

## 2021-09-02 NOTE — Progress Notes (Signed)
[place], [place] [address]   CLINIC:  Medical Oncology/Hematology  PCP:  Patient, No Pcp Per (Inactive) None None   REASON FOR VISIT:  Follow-up for metastatic colon cancer to liver  PRIOR THERAPY: Sigmoid colectomy and end colostomy on 04/29/2020  NGS Results: Foundation 1 KRAS wildtype, MS--stable, TMB 4 Muts/Mb  CURRENT THERAPY: FOLFOX, Avastin & Aloxi every 2 weeks  BRIEF ONCOLOGIC HISTORY:  Oncology History  Metastatic colon cancer to liver ([place])  05/16/2020 Initial Diagnosis   Metastatic colon cancer to liver ([place])   06/03/2020 Genetic Testing   Foundation One:     06/04/2020 -  Chemotherapy   Patient is on Treatment Plan : COLORECTAL FOLFOX + Bevacizumab q14d       CANCER STAGING:  Cancer Staging  No matching staging information was found for the patient.  INTERVAL HISTORY:  [name], a 61 y.o. female, returns for routine follow-up and consideration for next cycle of chemotherapy. Susan Martin was last seen on 08/05/2021.  Due for cycle #33 of FOLFOX + Bevacizumab  today.   Overall, she tells me she has been feeling pretty well. She reports sores in the corners of her mouth lasting a week after treatment which makes it difficult to eat and open her mouth. She reports occasional nosebleeds when blowing her nose.   Overall, she feels ready for next cycle of chemo today.   REVIEW OF SYSTEMS:  Review of Systems  Constitutional:  Positive for appetite change. Negative for fatigue.  HENT:   Positive for mouth sores and nosebleeds (occasional).   Respiratory:  Positive for shortness of breath.   Gastrointestinal:  Positive for diarrhea.  Neurological:  Positive for numbness.  All other systems reviewed and are negative.  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 Delton Coombes   04-29-2020 [facility] surgery for  perforated colon s/p sigmoid colectomy w/ colostomy; dx  Stage IV colon cancer mets to liver   Anxiety    Depression    Hypertension    followed by dr Glo Herring and oncology  (05-27-2020 per pt does not have pcp yet)   IDA (iron deficiency anemia)    Pulmonary nodule, right    Past Surgical History:  Procedure Laterality Date   ABDOMINAL HYSTERECTOMY  03-31-2016   @[facility]    W/  BILATERAL SALPINOOPHORECTOMY    APPLICATION OF WOUND VAC N/A 04/29/2020   Procedure: APPLICATION OF WOUND VAC;  Surgeon: Mickeal Skinner, MD;  Location: [facility];  Service: General;  Laterality: N/A;   ENDOMETRIAL ABLATION     LAPAROTOMY N/A 04/29/2020   Procedure: EXPLORATORY LAPAROTOMY;  Surgeon: Mickeal Skinner, MD;  Location: [facility];  Service: General;  Laterality: N/A;   PARTIAL COLECTOMY N/A 04/29/2020   Procedure: PARTIAL COLECTOMY WITH END COLOSTOMY;  Surgeon: Kieth Brightly Arta Bruce, MD;  Location: [facility];  Service: General;  Laterality: N/A;   PORTACATH PLACEMENT Right 05/28/2020   Procedure: INSERTION PORT-A-CATH;  Surgeon: Kinsinger, Arta Bruce, MD;  Location: [facility];  Service: General;  Laterality: Right;   SALPINGOOPHORECTOMY Left 03/31/2016   Procedure: LEFT SALPINGO OOPHORECTOMY WITH FROZEN SECTION,  ABDOMINAL HYSTERECTOMY WITH BILATERAL SALPINGO-OOPHORECTOMY;  Surgeon: Jonnie Kind, MD;  Location: [facility];  Service: Gynecology;  Laterality: Left;    SOCIAL HISTORY:  Social History   Socioeconomic History   Marital status: Divorced    Spouse name: Not on  file   Number of children: Not on file   Years of education: Not on file   Highest education level: Not on file  Occupational History   Not on file  Tobacco Use   Smoking status: Former    Packs/day: 0.50    Years: 5.00    Pack years: 2.50    Types: Cigarettes    Quit date: 03/28/2015    Years since quitting: 6.4   Smokeless tobacco: Never  Vaping Use   Vaping Use: Never used  Substance and Sexual Activity    Alcohol use: No   Drug use: Never   Sexual activity: Yes    Partners: Male    Birth control/protection: Surgical  Other Topics Concern   Not on file  Social History Narrative   Not on file   Social Determinants of Health   Financial Resource Strain: Not on file  Food Insecurity: Not on file  Transportation Needs: Not on file  Physical Activity: Not on file  Stress: Not on file  Social Connections: Not on file  Intimate Partner Violence: Not on file    FAMILY HISTORY:  Family History  Problem Relation Age of Onset   Hypertension Mother    Diabetes Mother    Cirrhosis Father     CURRENT MEDICATIONS:  Current Outpatient Medications  Medication Sig Dispense Refill   amLODipine (NORVASC) 5 MG tablet TAKE 1 TABLET (5 MG TOTAL) BY MOUTH DAILY. 90 tablet 1   BEVACIZUMAB IV Inject 5 mg/kg into the vein every 14 (fourteen) days.     fluorouracil CALGB 49702 in sodium chloride 0.9 % 150 mL Inject 2,400 mg/m2 into the vein over 48 hr.     gabapentin (NEURONTIN) 100 MG capsule Take 1 capsule (100 mg total) by mouth 3 (three) times daily. 90 capsule 3   hydrOXYzine (VISTARIL) 25 MG capsule Take 1 capsule (25 mg total) by mouth 3 (three) times daily as needed. Prn anxiety 60 capsule 2   LEUCOVORIN CALCIUM IV Inject 400 mg/m2 into the vein every 21 ( twenty-one) days.     lidocaine (XYLOCAINE) 2 % solution Use as directed 15 mLs in the mouth or throat every 6 (six) hours as needed for mouth pain. Swish and spit/swallow every six hours as needed for mouth pain 480 mL 3   lidocaine-prilocaine (EMLA) cream Apply small amount to port a cath site and cover with plastic wrap 1 hour prior to chemotherapy appointments 30 g 3   LORazepam (ATIVAN) 1 MG tablet TAKE 1 TABLET BY MOUTH EVERY 8 HOURS AS NEEDED FOR ANXIETY 90 tablet 1   nystatin (MYCOSTATIN) 100000 UNIT/ML suspension Take by mouth.     PARoxetine (PAXIL) 40 MG tablet Take 1 tablet (40 mg total) by mouth every morning. 90 tablet 3    polyethylene glycol (MIRALAX / GLYCOLAX) packet Take 17 g by mouth every other day.     potassium chloride (KLOR-CON M) 10 MEQ tablet Take 2 tablets (20 mEq total) by mouth daily. 60 tablet 6   prochlorperazine (COMPAZINE) 10 MG tablet Take 1 tablet (10 mg total) by mouth every 6 (six) hours as needed (Nausea or vomiting). 30 tablet 1   triamterene-hydrochlorothiazide (MAXZIDE-25) 37.5-25 MG tablet TAKE 1 TABLET BY MOUTH EVERY DAY 30 tablet 3   No current facility-administered medications for this visit.    ALLERGIES:  No Known Allergies  PHYSICAL EXAM:  Performance status (ECOG): 1 - Symptomatic but completely ambulatory  Vitals:   09/02/21 0952  BP: Marland Kitchen)  146/88  Pulse: 82  Resp: 18  Temp: 97.9 F (36.6 C)  SpO2: 96%   Wt Readings from Last 3 Encounters:  09/02/21 164 lb 14.5 oz (74.8 kg)  08/19/21 164 lb 9.6 oz (74.7 kg)  08/05/21 164 lb 11.2 oz (74.7 kg)   Physical Exam Vitals reviewed.  Constitutional:      Appearance: Normal appearance.  HENT:     Mouth/Throat:     Lips: No lesions.  Cardiovascular:     Rate and Rhythm: Normal rate and regular rhythm.     Pulses: Normal pulses.     Heart sounds: Normal heart sounds.  Pulmonary:     Effort: Pulmonary effort is normal.     Breath sounds: Normal breath sounds.  Neurological:     General: No focal deficit present.     Mental Status: She is alert and oriented to person, place, and time.  Psychiatric:        Mood and Affect: Mood normal.        Behavior: Behavior normal.    LABORATORY DATA:  I have reviewed the labs as listed.  CBC Latest Ref Rng & Units 09/02/2021 08/19/2021 08/04/2021  WBC 4.0 - 10.5 K/uL 5.0 4.2 6.3  Hemoglobin 12.0 - 15.0 g/dL 13.4 14.1 13.5  Hematocrit 36.0 - 46.0 % 39.5 40.4 40.5  Platelets 150 - 400 K/uL 117(L) 105(L) 118(L)   CMP Latest Ref Rng & Units 09/02/2021 08/19/2021 08/04/2021  Glucose 70 - 99 mg/dL 110(H) 113(H) 113(H)  BUN 8 - 23 mg/dL 16 13 10   Creatinine 0.44 - 1.00 mg/dL 0.83 0.91  0.96  Sodium 135 - 145 mmol/L 135 136 134(L)  Potassium 3.5 - 5.1 mmol/L 3.4(L) 3.5 3.7  Chloride 98 - 111 mmol/L 98 98 100  CO2 22 - 32 mmol/L 29 30 26   Calcium 8.9 - 10.3 mg/dL 9.0 9.3 9.1  Total Protein 6.5 - 8.1 g/dL 7.6 7.4 7.4  Total Bilirubin 0.3 - 1.2 mg/dL 1.0 1.0 0.6  Alkaline Phos 38 - 126 U/L 77 86 83  AST 15 - 41 U/L 30 35 38  ALT 0 - 44 U/L 25 35 38    DIAGNOSTIC IMAGING:  I have independently reviewed the scans and discussed with the patient. CT CHEST ABDOMEN PELVIS W CONTRAST  Result Date: 08/05/2021 CLINICAL DATA:  Follow-up metastatic colon cancer EXAM: CT CHEST, ABDOMEN, AND PELVIS WITH CONTRAST TECHNIQUE: Multidetector CT imaging of the chest, abdomen and pelvis was performed following the standard protocol during bolus administration of intravenous contrast. RADIATION DOSE REDUCTION: This exam was performed according to the departmental dose-optimization program which includes automated exposure control, adjustment of the mA and/or kV according to patient size and/or use of iterative reconstruction technique. CONTRAST:  151m OMNIPAQUE IOHEXOL 300 MG/ML  SOLN COMPARISON:  CT chest, abdomen and pelvis dated May 05, 2021 FINDINGS: CT CHEST FINDINGS Cardiovascular: Normal heart size. No pericardial effusion. No coronary artery calcifications. No suspicious filling defects of the pulmonary arteries. Mild atherosclerotic disease of the thoracic aorta. Right chest wall port with tip in the lower SVC. Mediastinum/Nodes: Esophagus is unremarkable. No pathologically enlarged lymph nodes seen in the chest. Lungs/Pleura: Central airways are patent. No consolidation, pleural effusion or pneumothorax. New ground-glass opacity of the right upper lobe measuring 10 mm on series 3, image 60, and new left lower lobe ground-glass opacities located on on image 79 and 93 likely infectious or inflammatory. Stable right middle lobe solid pulmonary nodule measuring 4 mm on series 3, image  94. Stable  juxtapleural solid nodule of the left lower lobe measuring 9 mm on series 3, image 74. No consolidation, pleural effusion or pneumothorax. Musculoskeletal: No chest wall mass or suspicious bone lesions identified. CT ABDOMEN PELVIS FINDINGS Hepatobiliary: Numerous ill-defined liver lesions which are stable when compared with prior exam. Reference lesion of the right hepatic lobe measuring 2.2 x 1.8 cm on series 2, image 58. Pancreas: Unremarkable. No pancreatic ductal dilatation or surrounding inflammatory changes. Spleen: Normal in size without focal abnormality. Adrenals/Urinary Tract: Adrenal glands are unremarkable. Kidneys are normal, without renal calculi, focal lesion, or hydronephrosis. Bladder is unremarkable. Stomach/Bowel: Prior sigmoid resection with left lower quadrant colostomy. Similar soft tissue stranding is seen in the pelvis at the sigmoid colon resection site which is likely postsurgical. Stomach and small bowel are unremarkable. Vascular/Lymphatic: Aortic atherosclerosis. No enlarged abdominal or pelvic lymph nodes. Reproductive: Status post hysterectomy. No adnexal masses. Other: No abdominopelvic ascites. Musculoskeletal: No acute or significant osseous findings. IMPRESSION: 1. Stable hepatic metastatic disease. 2. New ground-glass nodular opacities of the right upper lobe and left lower lobe which are likely infectious or inflammatory. Recommend attention on follow-up. 3. Status post sigmoid colon resection with left lower quadrant colostomy. 4.  Aortic Atherosclerosis (ICD10-I70.0). Electronically Signed   By: Yetta Glassman M.D.   On: 08/05/2021 10:17     ASSESSMENT:  1.  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 -CT chest on 05/02/2020 with no evidence of  pulmonary metastatic disease. -CTAP on 05/07/2020 with multiple lesions throughout the liver, largest in the right lobe measuring 3.9 cm, lateral segment left lobe measuring 2.6 cm and inferior right lobe confluent lesion 4.2 x 3.7 cm.  No lymphadenopathy. -CEA on 05/02/2020-60.2. -FOLFOX and bevacizumab started on 06/04/2020. 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   2.  Iron deficiency anemia: -Feraheme on 04/30/2020 and 05/06/2020.   3.  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 -Smoked for 3 to 4 years and quit. -Mother had cancer, type unknown.  Maternal uncle had lung cancer.  Maternal aunt had lung cancer.   PLAN:  1.  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  Minor groundglass nodular opacities in the right upper lobe and left lower lobe could be inflammatory. - She is tolerating maintenance 5-FU and Avastin very well.  No bleeding issues reported. - Reviewed labs which showed normal LFTs.  CBC was grossly normal with mild thrombocytopenia which is stable.  Last CEA was 2.2.  Urine today was negative for protein. - Proceed with treatment today and in 2 weeks.  RTC 4 weeks for follow-up.   2.  Hypertension: - Continue triamterene/hydrochlorothiazide and Norvasc 5 mg daily.   3.  Severe hypokalemia: - Continue potassium 20 mEq daily.   4.  Peripheral neuropathy: - Continue gabapentin 100 mg in the mornings.   5.  Anxiety: -Continue Paxil and Ativan 1 mg every 8 hours as needed.  6.  Mucositis: - She has mucositis in the corners of her mouth 2 to 3 days after each cycle of chemotherapy, lasting for about a week. - Recommend application of Vaseline and lidocaine gel.   Orders placed this encounter:  No orders of the defined types were placed in this  encounter.    Derek Jack, MD [place] [phone]   I, Thana Ates, am acting as a scribe for Dr. Derek Jack.  I, Derek Jack MD, have reviewed the above documentation for accuracy and completeness, and I agree with the above.

## 2021-09-02 NOTE — Progress Notes (Signed)
Patient presents today for treatment, okay to proceed with treatment per Dr. Delton Coombes. Potassium 3.4 per Dr. Delton Coombes patient will take additional potassium at home. Patient tolerated chemotherapy with no complaints voiced. Side effects with management reviewed understanding verbalized. Port site clean and dry with no bruising or swelling noted at site. Good blood return noted before and after administration of chemotherapy. Band aid applied. Patient left in satisfactory condition with VSS and no s/s of distress noted.  ?

## 2021-09-02 NOTE — Patient Instructions (Signed)
[facility]  Discharge Instructions: ?Thank you for choosing [facility] to provide your oncology and hematology care.  ?If you have a lab appointment with the [facility], please come in thru the Main Entrance and check in at the main information desk. ? ?Wear comfortable clothing and clothing appropriate for easy access to any Portacath or PICC line.  ? ?We strive to give you quality time with your provider. You may need to reschedule your appointment if you arrive late (15 or more minutes).  Arriving late affects you and other patients whose appointments are after yours.  Also, if you miss three or more appointments without notifying the office, you may be dismissed from the clinic at the provider?s discretion.    ?  ?For prescription refill requests, have your pharmacy contact our office and allow 72 hours for refills to be completed.   ? ?Today you received the following chemotherapy and/or immunotherapy agents FOLFOX, return as scheduled. ?  ?To help prevent nausea and vomiting after your treatment, we encourage you to take your nausea medication as directed. ? ?BELOW ARE SYMPTOMS THAT SHOULD BE REPORTED IMMEDIATELY: ?*FEVER GREATER THAN 100.4 F (38 ?C) OR HIGHER ?*CHILLS OR SWEATING ?*NAUSEA AND VOMITING THAT IS NOT CONTROLLED WITH YOUR NAUSEA MEDICATION ?*UNUSUAL SHORTNESS OF BREATH ?*UNUSUAL BRUISING OR BLEEDING ?*URINARY PROBLEMS (pain or burning when urinating, or frequent urination) ?*BOWEL PROBLEMS (unusual diarrhea, constipation, pain near the anus) ?TENDERNESS IN MOUTH AND THROAT WITH OR WITHOUT PRESENCE OF ULCERS (sore throat, sores in mouth, or a toothache) ?UNUSUAL RASH, SWELLING OR PAIN  ?UNUSUAL VAGINAL DISCHARGE OR ITCHING  ? ?Items with * indicate a potential emergency and should be followed up as soon as possible or go to the Emergency Department if any problems should occur. ? ?Please show the CHEMOTHERAPY ALERT CARD or IMMUNOTHERAPY ALERT CARD at check-in to the  Emergency Department and triage nurse. ? ?Should you have questions after your visit or need to cancel or reschedule your appointment, please contact [facility] [phone]  and follow the prompts.  Office hours are 8:00 a.m. to 4:30 p.m. Monday - Friday. Please note that voicemails left after 4:00 p.m. may not be returned until the following business day.  We are closed weekends and major holidays. You have access to a nurse at all times for urgent questions. Please call the main number to the clinic [phone] and follow the prompts. ? ?For any non-urgent questions, you may also contact your provider using MyChart. We now offer e-Visits for anyone 29 and older to request care online for non-urgent symptoms. For details visit [email or web address]. ?  ?Also download the MyChart app! Go to the app store, search "MyChart", open the app, select [facility], and log in with your MyChart username and password. ? ?Due to Covid, a mask is required upon entering the hospital/clinic. If you do not have a mask, one will be given to you upon arrival. For doctor visits, patients may have 1 support person aged 42 or older with them. For treatment visits, patients cannot have anyone with them due to current Covid guidelines and our immunocompromised population.  ?

## 2021-09-03 LAB — CEA: CEA: 2.2 ng/mL (ref 0.0–4.7)

## 2021-09-04 ENCOUNTER — Other Ambulatory Visit: Payer: Self-pay

## 2021-09-04 ENCOUNTER — Encounter (HOSPITAL_COMMUNITY): Payer: Self-pay

## 2021-09-04 ENCOUNTER — Inpatient Hospital Stay (HOSPITAL_COMMUNITY): Payer: Medicaid Other

## 2021-09-04 VITALS — BP 140/83 | HR 77 | Temp 97.2°F | Resp 18

## 2021-09-04 DIAGNOSIS — Z5111 Encounter for antineoplastic chemotherapy: Secondary | ICD-10-CM | POA: Diagnosis not present

## 2021-09-04 DIAGNOSIS — C787 Secondary malignant neoplasm of liver and intrahepatic bile duct: Secondary | ICD-10-CM

## 2021-09-04 MED ORDER — HEPARIN SOD (PORK) LOCK FLUSH 100 UNIT/ML IV SOLN
500.0000 [IU] | Freq: Once | INTRAVENOUS | Status: AC | PRN
  Administered 2021-09-04: 13:00:00 500 [IU]

## 2021-09-04 MED ORDER — SODIUM CHLORIDE 0.9% FLUSH
10.0000 mL | INTRAVENOUS | Status: DC | PRN
  Administered 2021-09-04: 13:00:00 10 mL

## 2021-09-04 NOTE — Progress Notes (Signed)
Chemotherapy pump disconnected with no complaints voiced.  Patients port flushed without difficulty.  Good blood return noted with no bruising or swelling noted at site.  Band aid applied.  VSS with discharge and left in satisfactory condition with no s/s of distress noted.    

## 2021-09-04 NOTE — Patient Instructions (Signed)
[facility]  Discharge Instructions: Thank you for choosing [facility] to provide your oncology and hematology care.  If you have a lab appointment with the Cancer Center, please come in thru the Main Entrance and check in at the main information desk.  Wear comfortable clothing and clothing appropriate for easy access to any Portacath or PICC line.   We strive to give you quality time with your provider. You may need to reschedule your appointment if you arrive late (15 or more minutes).  Arriving late affects you and other patients whose appointments are after yours.  Also, if you miss three or more appointments without notifying the office, you may be dismissed from the clinic at the provider's discretion.      For prescription refill requests, have your pharmacy contact our office and allow 72 hours for refills to be completed.        To help prevent nausea and vomiting after your treatment, we encourage you to take your nausea medication as directed.  BELOW ARE SYMPTOMS THAT SHOULD BE REPORTED IMMEDIATELY: *FEVER GREATER THAN 100.4 F (38 C) OR HIGHER *CHILLS OR SWEATING *NAUSEA AND VOMITING THAT IS NOT CONTROLLED WITH YOUR NAUSEA MEDICATION *UNUSUAL SHORTNESS OF BREATH *UNUSUAL BRUISING OR BLEEDING *URINARY PROBLEMS (pain or burning when urinating, or frequent urination) *BOWEL PROBLEMS (unusual diarrhea, constipation, pain near the anus) TENDERNESS IN MOUTH AND THROAT WITH OR WITHOUT PRESENCE OF ULCERS (sore throat, sores in mouth, or a toothache) UNUSUAL RASH, SWELLING OR PAIN  UNUSUAL VAGINAL DISCHARGE OR ITCHING   Items with * indicate a potential emergency and should be followed up as soon as possible or go to the Emergency Department if any problems should occur.  Please show the CHEMOTHERAPY ALERT CARD or IMMUNOTHERAPY ALERT CARD at check-in to the Emergency Department and triage nurse.  Should you have questions after your visit or need to cancel  or reschedule your appointment, please contact [facility] [phone]  and follow the prompts.  Office hours are 8:00 a.m. to 4:30 p.m. Monday - Friday. Please note that voicemails left after 4:00 p.m. may not be returned until the following business day.  We are closed weekends and major holidays. You have access to a nurse at all times for urgent questions. Please call the main number to the clinic [phone] and follow the prompts.  For any non-urgent questions, you may also contact your provider using MyChart. We now offer e-Visits for anyone 18 and older to request care online for non-urgent symptoms. For details visit [email or web address].   Also download the MyChart app! Go to the app store, search "MyChart", open the app, select [facility], and log in with your MyChart username and password.  Due to Covid, a mask is required upon entering the hospital/clinic. If you do not have a mask, one will be given to you upon arrival. For doctor visits, patients may have 1 support person aged 18 or older with them. For treatment visits, patients cannot have anyone with them due to current Covid guidelines and our immunocompromised population.  

## 2021-09-16 ENCOUNTER — Inpatient Hospital Stay (HOSPITAL_COMMUNITY): Payer: Medicaid Other

## 2021-09-16 ENCOUNTER — Other Ambulatory Visit: Payer: Self-pay

## 2021-09-16 ENCOUNTER — Encounter (HOSPITAL_COMMUNITY): Payer: Self-pay

## 2021-09-16 VITALS — BP 150/87 | HR 76 | Temp 97.6°F | Resp 18

## 2021-09-16 DIAGNOSIS — Z5111 Encounter for antineoplastic chemotherapy: Secondary | ICD-10-CM | POA: Diagnosis not present

## 2021-09-16 DIAGNOSIS — C189 Malignant neoplasm of colon, unspecified: Secondary | ICD-10-CM

## 2021-09-16 LAB — CBC WITH DIFFERENTIAL/PLATELET
Abs Immature Granulocytes: 0.02 10*3/uL (ref 0.00–0.07)
Basophils Absolute: 0 10*3/uL (ref 0.0–0.1)
Basophils Relative: 1 %
Eosinophils Absolute: 0.1 10*3/uL (ref 0.0–0.5)
Eosinophils Relative: 3 %
HCT: 39 % (ref 36.0–46.0)
Hemoglobin: 13.5 g/dL (ref 12.0–15.0)
Immature Granulocytes: 0 %
Lymphocytes Relative: 32 %
Lymphs Abs: 1.6 10*3/uL (ref 0.7–4.0)
MCH: 32.8 pg (ref 26.0–34.0)
MCHC: 34.6 g/dL (ref 30.0–36.0)
MCV: 94.7 fL (ref 80.0–100.0)
Monocytes Absolute: 0.6 10*3/uL (ref 0.1–1.0)
Monocytes Relative: 12 %
Neutro Abs: 2.7 10*3/uL (ref 1.7–7.7)
Neutrophils Relative %: 52 %
Platelets: 120 10*3/uL — ABNORMAL LOW (ref 150–400)
RBC: 4.12 MIL/uL (ref 3.87–5.11)
RDW: 15.2 % (ref 11.5–15.5)
WBC: 5.1 10*3/uL (ref 4.0–10.5)
nRBC: 0 % (ref 0.0–0.2)

## 2021-09-16 LAB — COMPREHENSIVE METABOLIC PANEL
ALT: 27 U/L (ref 0–44)
AST: 29 U/L (ref 15–41)
Albumin: 3.6 g/dL (ref 3.5–5.0)
Alkaline Phosphatase: 72 U/L (ref 38–126)
Anion gap: 9 (ref 5–15)
BUN: 14 mg/dL (ref 8–23)
CO2: 27 mmol/L (ref 22–32)
Calcium: 9 mg/dL (ref 8.9–10.3)
Chloride: 99 mmol/L (ref 98–111)
Creatinine, Ser: 0.82 mg/dL (ref 0.44–1.00)
GFR, Estimated: >60 mL/min (ref >60)
Glucose, Bld: 124 mg/dL — ABNORMAL HIGH (ref 70–99)
Potassium: 3 mmol/L — ABNORMAL LOW (ref 3.5–5.1)
Sodium: 135 mmol/L (ref 135–145)
Total Bilirubin: 0.8 mg/dL (ref 0.3–1.2)
Total Protein: 7.2 g/dL (ref 6.5–8.1)

## 2021-09-16 LAB — MAGNESIUM: Magnesium: 1.9 mg/dL (ref 1.7–2.4)

## 2021-09-16 MED ORDER — SODIUM CHLORIDE 0.9 % IV SOLN
400.0000 mg/m2 | Freq: Once | INTRAVENOUS | Status: AC
  Administered 2021-09-16: 680 mg via INTRAVENOUS
  Filled 2021-09-16: qty 34

## 2021-09-16 MED ORDER — SODIUM CHLORIDE 0.9 % IV SOLN: Freq: Once | INTRAVENOUS | Status: AC

## 2021-09-16 MED ORDER — SODIUM CHLORIDE 0.9 % IV SOLN
5.0000 mg/kg | Freq: Once | INTRAVENOUS | Status: AC
  Administered 2021-09-16: 400 mg via INTRAVENOUS
  Filled 2021-09-16: qty 16

## 2021-09-16 MED ORDER — SODIUM CHLORIDE 0.9 % IV SOLN
10.0000 mg | Freq: Once | INTRAVENOUS | Status: AC
  Administered 2021-09-16: 10 mg via INTRAVENOUS
  Filled 2021-09-16: qty 10

## 2021-09-16 MED ORDER — FLUOROURACIL CHEMO INJECTION 2.5 GM/50ML
320.0000 mg/m2 | Freq: Once | INTRAVENOUS | Status: AC
  Administered 2021-09-16: 550 mg via INTRAVENOUS
  Filled 2021-09-16: qty 11

## 2021-09-16 MED ORDER — SODIUM CHLORIDE 0.9 % IV SOLN
1920.0000 mg/m2 | INTRAVENOUS | Status: DC
  Administered 2021-09-16: 3250 mg via INTRAVENOUS
  Filled 2021-09-16: qty 65

## 2021-09-16 MED ORDER — PALONOSETRON HCL INJECTION 0.25 MG/5ML
0.2500 mg | Freq: Once | INTRAVENOUS | Status: AC
  Administered 2021-09-16: 0.25 mg via INTRAVENOUS
  Filled 2021-09-16: qty 5

## 2021-09-16 NOTE — Patient Instructions (Signed)
[facility] [place]  Discharge Instructions: ?Thank you for choosing [facility] to provide your oncology and hematology care.  ?If you have a lab appointment with the [facility], please come in thru the Main Entrance and check in at the main information desk. ? ?Wear comfortable clothing and clothing appropriate for easy access to any Portacath or PICC line.  ? ?We strive to give you quality time with your provider. You may need to reschedule your appointment if you arrive late (15 or more minutes).  Arriving late affects you and other patients whose appointments are after yours.  Also, if you miss three or more appointments without notifying the office, you may be dismissed from the clinic at the provider?s discretion.    ?  ?For prescription refill requests, have your pharmacy contact our office and allow 72 hours for refills to be completed.   ? ?Today you received MVASI, Leucovorin, 5FU.     ?  ?To help prevent nausea and vomiting after your treatment, we encourage you to take your nausea medication as directed. ? ?BELOW ARE SYMPTOMS THAT SHOULD BE REPORTED IMMEDIATELY: ?*FEVER GREATER THAN 100.4 F (38 ?C) OR HIGHER ?*CHILLS OR SWEATING ?*NAUSEA AND VOMITING THAT IS NOT CONTROLLED WITH YOUR NAUSEA MEDICATION ?*UNUSUAL SHORTNESS OF BREATH ?*UNUSUAL BRUISING OR BLEEDING ?*URINARY PROBLEMS (pain or burning when urinating, or frequent urination) ?*BOWEL PROBLEMS (unusual diarrhea, constipation, pain near the anus) ?TENDERNESS IN MOUTH AND THROAT WITH OR WITHOUT PRESENCE OF ULCERS (sore throat, sores in mouth, or a toothache) ?UNUSUAL RASH, SWELLING OR PAIN  ?UNUSUAL VAGINAL DISCHARGE OR ITCHING  ? ?Items with * indicate a potential emergency and should be followed up as soon as possible or go to the Emergency Department if any problems should occur. ? ?Please show the CHEMOTHERAPY ALERT CARD or IMMUNOTHERAPY ALERT CARD at check-in to the Emergency Department and triage nurse. ? ?Should you  have questions after your visit or need to cancel or reschedule your appointment, please contact [facility] [phone]  and follow the prompts.  Office hours are 8:00 a.m. to 4:30 p.m. Monday - Friday. Please note that voicemails left after 4:00 p.m. may not be returned until the following business day.  We are closed weekends and major holidays. You have access to a nurse at all times for urgent questions. Please call the main number to the clinic [phone] and follow the prompts. ? ?For any non-urgent questions, you may also contact your provider using MyChart. We now offer e-Visits for anyone 9 and older to request care online for non-urgent symptoms. For details visit [email or web address]. ?  ?Also download the MyChart app! Go to the app store, search "MyChart", open the app, select [facility], and log in with your MyChart username and password. ? ?Due to Covid, a mask is required upon entering the hospital/clinic. If you do not have a mask, one will be given to you upon arrival. For doctor visits, patients may have 1 support person aged 62 or older with them. For treatment visits, patients cannot have anyone with them due to current Covid guidelines and our immunocompromised population.  ?

## 2021-09-16 NOTE — Progress Notes (Signed)
Patient presents today for treatment. Vital signs within parameters for today's treatment. MAR reviewed and updated. Patient has no complaints today related to side effects of treatment. Patient has complaints of anxiety not controlled by her new medication. Message sent to Dr. Delton Coombes pertaining to her prescriptions for Vistaril and Ativan.  ? ?Treatment given today per MD orders. Tolerated infusion without adverse affects. Vital signs stable. No complaints at this time. 5 FU pump infusing and RUN noted on the screen. Discharged from clinic ambulatory in stable condition. Alert and oriented x 3. F/U with [facility] as scheduled.   ?

## 2021-09-16 NOTE — Progress Notes (Signed)
Patients port flushed without difficulty.  Good blood return noted with no bruising or swelling noted at site.  Stable during access and blood draw.  Patient to remain accessed for treatment. 

## 2021-09-18 ENCOUNTER — Inpatient Hospital Stay (HOSPITAL_COMMUNITY): Payer: Medicaid Other

## 2021-09-18 ENCOUNTER — Other Ambulatory Visit: Payer: Self-pay

## 2021-09-18 VITALS — BP 147/84 | HR 75 | Temp 98.3°F | Resp 18 | Wt 171.4 lb

## 2021-09-18 DIAGNOSIS — C189 Malignant neoplasm of colon, unspecified: Secondary | ICD-10-CM

## 2021-09-18 DIAGNOSIS — Z5111 Encounter for antineoplastic chemotherapy: Secondary | ICD-10-CM | POA: Diagnosis not present

## 2021-09-18 MED ORDER — HEPARIN SOD (PORK) LOCK FLUSH 100 UNIT/ML IV SOLN
500.0000 [IU] | Freq: Once | INTRAVENOUS | Status: AC | PRN
  Administered 2021-09-18: 500 [IU]

## 2021-09-18 MED ORDER — SODIUM CHLORIDE 0.9% FLUSH
10.0000 mL | INTRAVENOUS | Status: DC | PRN
  Administered 2021-09-18: 10 mL

## 2021-09-18 NOTE — Patient Instructions (Signed)
[facility]  Discharge Instructions: ?Thank you for choosing [facility] to provide your oncology and hematology care.  ?If you have a lab appointment with the [facility], please come in thru the Main Entrance and check in at the main information desk. ? ?Wear comfortable clothing and clothing appropriate for easy access to any Portacath or PICC line.  ? ?We strive to give you quality time with your provider. You may need to reschedule your appointment if you arrive late (15 or more minutes).  Arriving late affects you and other patients whose appointments are after yours.  Also, if you miss three or more appointments without notifying the office, you may be dismissed from the clinic at the provider?s discretion.    ?  ?For prescription refill requests, have your pharmacy contact our office and allow 72 hours for refills to be completed.   ? ?Today you received the following chemotherapy and/or immunotherapy agents Pupm stop and port deaccess    ?  ?To help prevent nausea and vomiting after your treatment, we encourage you to take your nausea medication as directed. ? ?BELOW ARE SYMPTOMS THAT SHOULD BE REPORTED IMMEDIATELY: ?*FEVER GREATER THAN 100.4 F (38 ?C) OR HIGHER ?*CHILLS OR SWEATING ?*NAUSEA AND VOMITING THAT IS NOT CONTROLLED WITH YOUR NAUSEA MEDICATION ?*UNUSUAL SHORTNESS OF BREATH ?*UNUSUAL BRUISING OR BLEEDING ?*URINARY PROBLEMS (pain or burning when urinating, or frequent urination) ?*BOWEL PROBLEMS (unusual diarrhea, constipation, pain near the anus) ?TENDERNESS IN MOUTH AND THROAT WITH OR WITHOUT PRESENCE OF ULCERS (sore throat, sores in mouth, or a toothache) ?UNUSUAL RASH, SWELLING OR PAIN  ?UNUSUAL VAGINAL DISCHARGE OR ITCHING  ? ?Items with * indicate a potential emergency and should be followed up as soon as possible or go to the Emergency Department if any problems should occur. ? ?Please show the CHEMOTHERAPY ALERT CARD or IMMUNOTHERAPY ALERT CARD at check-in to  the Emergency Department and triage nurse. ? ?Should you have questions after your visit or need to cancel or reschedule your appointment, please contact [facility] [phone]  and follow the prompts.  Office hours are 8:00 a.m. to 4:30 p.m. Monday - Friday. Please note that voicemails left after 4:00 p.m. may not be returned until the following business day.  We are closed weekends and major holidays. You have access to a nurse at all times for urgent questions. Please call the main number to the clinic [phone] and follow the prompts. ? ?For any non-urgent questions, you may also contact your provider using MyChart. We now offer e-Visits for anyone 65 and older to request care online for non-urgent symptoms. For details visit [email or web address]. ?  ?Also download the MyChart app! Go to the app store, search "MyChart", open the app, select [facility], and log in with your MyChart username and password. ? ?Due to Covid, a mask is required upon entering the hospital/clinic. If you do not have a mask, one will be given to you upon arrival. For doctor visits, patients may have 1 support person aged 60 or older with them. For treatment visits, patients cannot have anyone with them due to current Covid guidelines and our immunocompromised population.  ?

## 2021-09-18 NOTE — Progress Notes (Signed)
Patient presents today for 5FU pump stop and disconnection after 46 hour continous infusion.   5FU pump deaccessed.  Patients port flushed without difficulty.  Good blood return noted with no bruising or swelling noted at site.  needle removed intact.  Band aid applied.  VSS with discharge and left in satisfactory condition ambulatory with no s/s of distress noted.    ?

## 2021-09-30 ENCOUNTER — Inpatient Hospital Stay (HOSPITAL_COMMUNITY): Payer: Medicaid Other

## 2021-09-30 ENCOUNTER — Other Ambulatory Visit (HOSPITAL_COMMUNITY): Payer: Self-pay | Admitting: *Deleted

## 2021-09-30 ENCOUNTER — Inpatient Hospital Stay (HOSPITAL_COMMUNITY): Payer: Medicaid Other | Attending: Hematology

## 2021-09-30 ENCOUNTER — Inpatient Hospital Stay (HOSPITAL_BASED_OUTPATIENT_CLINIC_OR_DEPARTMENT_OTHER): Payer: Medicaid Other | Admitting: Hematology

## 2021-09-30 VITALS — BP 153/78 | HR 78 | Temp 97.8°F | Resp 18

## 2021-09-30 DIAGNOSIS — C189 Malignant neoplasm of colon, unspecified: Secondary | ICD-10-CM

## 2021-09-30 DIAGNOSIS — F419 Anxiety disorder, unspecified: Secondary | ICD-10-CM | POA: Insufficient documentation

## 2021-09-30 DIAGNOSIS — C187 Malignant neoplasm of sigmoid colon: Secondary | ICD-10-CM | POA: Insufficient documentation

## 2021-09-30 DIAGNOSIS — G4700 Insomnia, unspecified: Secondary | ICD-10-CM | POA: Diagnosis not present

## 2021-09-30 DIAGNOSIS — Z87891 Personal history of nicotine dependence: Secondary | ICD-10-CM | POA: Diagnosis not present

## 2021-09-30 DIAGNOSIS — C787 Secondary malignant neoplasm of liver and intrahepatic bile duct: Secondary | ICD-10-CM | POA: Diagnosis not present

## 2021-09-30 DIAGNOSIS — R911 Solitary pulmonary nodule: Secondary | ICD-10-CM | POA: Diagnosis not present

## 2021-09-30 DIAGNOSIS — Z90710 Acquired absence of both cervix and uterus: Secondary | ICD-10-CM | POA: Diagnosis not present

## 2021-09-30 DIAGNOSIS — I10 Essential (primary) hypertension: Secondary | ICD-10-CM | POA: Diagnosis not present

## 2021-09-30 DIAGNOSIS — Z90722 Acquired absence of ovaries, bilateral: Secondary | ICD-10-CM | POA: Diagnosis not present

## 2021-09-30 DIAGNOSIS — D509 Iron deficiency anemia, unspecified: Secondary | ICD-10-CM | POA: Diagnosis not present

## 2021-09-30 DIAGNOSIS — K1230 Oral mucositis (ulcerative), unspecified: Secondary | ICD-10-CM | POA: Diagnosis not present

## 2021-09-30 DIAGNOSIS — G629 Polyneuropathy, unspecified: Secondary | ICD-10-CM | POA: Insufficient documentation

## 2021-09-30 DIAGNOSIS — Z5111 Encounter for antineoplastic chemotherapy: Secondary | ICD-10-CM | POA: Insufficient documentation

## 2021-09-30 DIAGNOSIS — Z79899 Other long term (current) drug therapy: Secondary | ICD-10-CM | POA: Insufficient documentation

## 2021-09-30 DIAGNOSIS — E876 Hypokalemia: Secondary | ICD-10-CM | POA: Insufficient documentation

## 2021-09-30 LAB — CBC WITH DIFFERENTIAL/PLATELET
Abs Immature Granulocytes: 0.01 10*3/uL (ref 0.00–0.07)
Basophils Absolute: 0.1 10*3/uL (ref 0.0–0.1)
Basophils Relative: 1 %
Eosinophils Absolute: 0.1 10*3/uL (ref 0.0–0.5)
Eosinophils Relative: 2 %
HCT: 42.5 % (ref 36.0–46.0)
Hemoglobin: 14.6 g/dL (ref 12.0–15.0)
Immature Granulocytes: 0 %
Lymphocytes Relative: 27 %
Lymphs Abs: 1.7 10*3/uL (ref 0.7–4.0)
MCH: 32.2 pg (ref 26.0–34.0)
MCHC: 34.4 g/dL (ref 30.0–36.0)
MCV: 93.8 fL (ref 80.0–100.0)
Monocytes Absolute: 0.6 10*3/uL (ref 0.1–1.0)
Monocytes Relative: 9 %
Neutro Abs: 3.9 10*3/uL (ref 1.7–7.7)
Neutrophils Relative %: 61 %
Platelets: 131 10*3/uL — ABNORMAL LOW (ref 150–400)
RBC: 4.53 MIL/uL (ref 3.87–5.11)
RDW: 15.1 % (ref 11.5–15.5)
WBC: 6.4 10*3/uL (ref 4.0–10.5)
nRBC: 0 % (ref 0.0–0.2)

## 2021-09-30 LAB — COMPREHENSIVE METABOLIC PANEL
ALT: 27 U/L (ref 0–44)
AST: 33 U/L (ref 15–41)
Albumin: 4 g/dL (ref 3.5–5.0)
Alkaline Phosphatase: 75 U/L (ref 38–126)
Anion gap: 9 (ref 5–15)
BUN: 12 mg/dL (ref 8–23)
CO2: 25 mmol/L (ref 22–32)
Calcium: 9.1 mg/dL (ref 8.9–10.3)
Chloride: 101 mmol/L (ref 98–111)
Creatinine, Ser: 0.72 mg/dL (ref 0.44–1.00)
GFR, Estimated: >60 mL/min (ref >60)
Glucose, Bld: 126 mg/dL — ABNORMAL HIGH (ref 70–99)
Potassium: 3.4 mmol/L — ABNORMAL LOW (ref 3.5–5.1)
Sodium: 135 mmol/L (ref 135–145)
Total Bilirubin: 0.8 mg/dL (ref 0.3–1.2)
Total Protein: 7.9 g/dL (ref 6.5–8.1)

## 2021-09-30 LAB — MAGNESIUM: Magnesium: 2.2 mg/dL (ref 1.7–2.4)

## 2021-09-30 MED ORDER — SODIUM CHLORIDE 0.9 % IV SOLN
1920.0000 mg/m2 | INTRAVENOUS | Status: DC
  Administered 2021-09-30: 3250 mg via INTRAVENOUS
  Filled 2021-09-30: qty 65

## 2021-09-30 MED ORDER — SODIUM CHLORIDE 0.9 % IV SOLN
5.0000 mg/kg | Freq: Once | INTRAVENOUS | Status: AC
  Administered 2021-09-30: 400 mg via INTRAVENOUS
  Filled 2021-09-30: qty 16

## 2021-09-30 MED ORDER — SODIUM CHLORIDE 0.9 % IV SOLN
400.0000 mg/m2 | Freq: Once | INTRAVENOUS | Status: AC
  Administered 2021-09-30: 680 mg via INTRAVENOUS
  Filled 2021-09-30: qty 34

## 2021-09-30 MED ORDER — SODIUM CHLORIDE 0.9 % IV SOLN
10.0000 mg | Freq: Once | INTRAVENOUS | Status: AC
  Administered 2021-09-30: 10 mg via INTRAVENOUS
  Filled 2021-09-30: qty 10

## 2021-09-30 MED ORDER — PALONOSETRON HCL INJECTION 0.25 MG/5ML
0.2500 mg | Freq: Once | INTRAVENOUS | Status: AC
  Administered 2021-09-30: 0.25 mg via INTRAVENOUS
  Filled 2021-09-30: qty 5

## 2021-09-30 MED ORDER — FLUOROURACIL CHEMO INJECTION 2.5 GM/50ML
320.0000 mg/m2 | Freq: Once | INTRAVENOUS | Status: AC
  Administered 2021-09-30: 550 mg via INTRAVENOUS
  Filled 2021-09-30: qty 11

## 2021-09-30 MED ORDER — LORAZEPAM 1 MG PO TABS: 1.0000 mg | ORAL_TABLET | Freq: Four times a day (QID) | ORAL | 1 refills | Status: DC | PRN

## 2021-09-30 MED ORDER — SODIUM CHLORIDE 0.9 % IV SOLN: Freq: Once | INTRAVENOUS | Status: AC

## 2021-09-30 NOTE — Patient Instructions (Signed)
[facility]  Discharge Instructions: ?Thank you for choosing [facility] to provide your oncology and hematology care.  ?If you have a lab appointment with the [facility], please come in thru the Main Entrance and check in at the main information desk. ? ?Wear comfortable clothing and clothing appropriate for easy access to any Portacath or PICC line.  ? ?We strive to give you quality time with your provider. You may need to reschedule your appointment if you arrive late (15 or more minutes).  Arriving late affects you and other patients whose appointments are after yours.  Also, if you miss three or more appointments without notifying the office, you may be dismissed from the clinic at the provider?s discretion.    ?  ?For prescription refill requests, have your pharmacy contact our office and allow 72 hours for refills to be completed.   ? ?Today you received the following chemotherapy and/or immunotherapy agents Avastin, Leucovorin, and 5FU. ? ? ?BELOW ARE SYMPTOMS THAT SHOULD BE REPORTED IMMEDIATELY: ?*FEVER GREATER THAN 100.4 F (38 ?C) OR HIGHER ?*CHILLS OR SWEATING ?*NAUSEA AND VOMITING THAT IS NOT CONTROLLED WITH YOUR NAUSEA MEDICATION ?*UNUSUAL SHORTNESS OF BREATH ?*UNUSUAL BRUISING OR BLEEDING ?*URINARY PROBLEMS (pain or burning when urinating, or frequent urination) ?*BOWEL PROBLEMS (unusual diarrhea, constipation, pain near the anus) ?TENDERNESS IN MOUTH AND THROAT WITH OR WITHOUT PRESENCE OF ULCERS (sore throat, sores in mouth, or a toothache) ?UNUSUAL RASH, SWELLING OR PAIN  ?UNUSUAL VAGINAL DISCHARGE OR ITCHING  ? ?Items with * indicate a potential emergency and should be followed up as soon as possible or go to the Emergency Department if any problems should occur. ? ?Please show the CHEMOTHERAPY ALERT CARD or IMMUNOTHERAPY ALERT CARD at check-in to the Emergency Department and triage nurse. ? ?Should you have questions after your visit or need to cancel or reschedule  your appointment, please contact [facility] [phone]  and follow the prompts.  Office hours are 8:00 a.m. to 4:30 p.m. Monday - Friday. Please note that voicemails left after 4:00 p.m. may not be returned until the following business day.  We are closed weekends and major holidays. You have access to a nurse at all times for urgent questions. Please call the main number to the clinic [phone] and follow the prompts. ? ?For any non-urgent questions, you may also contact your provider using MyChart. We now offer e-Visits for anyone 20 and older to request care online for non-urgent symptoms. For details visit [email or web address]. ?  ?Also download the MyChart app! Go to the app store, search "MyChart", open the app, select [facility], and log in with your MyChart username and password. ? ?Due to Covid, a mask is required upon entering the hospital/clinic. If you do not have a mask, one will be given to you upon arrival. For doctor visits, patients may have 1 support person aged 67 or older with them. For treatment visits, patients cannot have anyone with them due to current Covid guidelines and our immunocompromised population.  ?

## 2021-09-30 NOTE — Progress Notes (Signed)
? ?[facility] ?[address] ?[place],  [address] ? ? ?CLINIC:  ?Medical Oncology/Hematology ? ?PCP:  ?Patient, No Pcp Per (Inactive) ?None ?None ? ? 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 ?PRIOR THERAPY: Sigmoid colectomy and end colostomy on 04/29/2020 ? ?NGS Results: Foundation 1 KRAS wildtype, MS--stable, TMB 4 Muts/Mb ? ?CURRENT THERAPY: FOLFOX, Avastin & Aloxi every 2 weeks ? ?BRIEF ONCOLOGIC HISTORY:  ?Oncology History  ?Metastatic colon cancer to liver [facility])  ?05/16/2020 Initial Diagnosis  ? Metastatic colon cancer to liver [facility]) ?  ?06/03/2020 Genetic Testing  ? Foundation One: ? ? ?  ?06/04/2020 -  Chemotherapy  ? Patient is on Treatment Plan : COLORECTAL FOLFOX + Bevacizumab q14d  ?   ? ? ?CANCER STAGING: ? Cancer Staging  ?No matching staging information was found for the patient. ? ?INTERVAL HISTORY:  ?[name], a 61 y.o. female, returns for routine follow-up and consideration for next cycle of chemotherapy. Susan Martin was last seen on 09/02/2021. ? ?Due for cycle #35 of FOLFOX + Bevacizumab today.  ? ?Overall, she tells me she has been feeling pretty well. She reports anxiety and poor sleep for which she take ativan every 8 hours, but she reports it is not last 8 hours. She denies n/v/d/c and current bleeding. She denies new pains.   ? ?Overall, she feels ready for next cycle of chemo today.  ? ? ?REVIEW OF SYSTEMS:  ?Review of Systems  ?Constitutional:  Negative for appetite change and fatigue.  ?HENT:   Negative for nosebleeds.   ?Respiratory:  Negative for hemoptysis.   ?Gastrointestinal:  Negative for blood in stool, constipation, diarrhea, nausea and vomiting.  ?Genitourinary:  Negative for hematuria.   ?Neurological:  Positive for numbness.  ?Hematological:  Does not bruise/bleed easily.  ?Psychiatric/Behavioral:  Positive for depression and sleep disturbance. The patient is nervous/anxious.   ?All other systems reviewed and are negative. ? ?PAST  MEDICAL/SURGICAL HISTORY:  ?Past Medical History:  ?Diagnosis Date  ? 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 Delton Coombes  ? 04-29-2020 [facility] surgery for perforated colon s/p sigmoid colectomy w/ colostomy; dx  Stage IV colon cancer mets to liver  ? Anxiety   ? Depression   ? Hypertension   ? followed by dr Glo Herring and oncology  (05-27-2020 per pt does not have pcp yet)  ? IDA (iron deficiency anemia)   ? Pulmonary nodule, right   ? ?Past Surgical History:  ?Procedure Laterality Date  ? ABDOMINAL HYSTERECTOMY  03-31-2016   _0   ? W/  BILATERAL SALPINOOPHORECTOMY   ? APPLICATION OF WOUND VAC N/A 04/29/2020  ? Procedure: APPLICATION OF WOUND VAC;  Surgeon: Kinsinger, Arta Bruce, MD;  Location: [facility];  Service: General;  Laterality: N/A;  ? ENDOMETRIAL ABLATION    ? LAPAROTOMY N/A 04/29/2020  ? Procedure: EXPLORATORY LAPAROTOMY;  Surgeon: Kinsinger, Arta Bruce, MD;  Location: [facility];  Service: General;  Laterality: N/A;  ? PARTIAL COLECTOMY N/A 04/29/2020  ? Procedure: PARTIAL COLECTOMY WITH END COLOSTOMY;  Surgeon: Kinsinger, Arta Bruce, MD;  Location: [facility];  Service: General;  Laterality: N/A;  ? PORTACATH PLACEMENT Right 05/28/2020  ? Procedure: INSERTION PORT-A-CATH;  Surgeon: Kinsinger, Arta Bruce, MD;  Location: [facility];  Service: General;  Laterality: Right;  ? SALPINGOOPHORECTOMY Left 03/31/2016  ? Procedure: LEFT SALPINGO OOPHORECTOMY WITH FROZEN SECTION,  ABDOMINAL HYSTERECTOMY WITH BILATERAL SALPINGO-OOPHORECTOMY;  Surgeon: Jonnie Kind, MD;  Location: [facility];  Service: Gynecology;  Laterality: Left;  ? ? ?SOCIAL HISTORY:  ?Social History  ? ?Socioeconomic History  ? Marital status: Divorced  ?  Spouse name: Not on file  ? Number of children: Not on file  ? Years of education: Not on file  ? Highest education level: Not on file  ?Occupational History  ? Not on file  ?Tobacco Use  ? Smoking status: Former  ?  Packs/day: 0.50  ?  Years: 5.00  ?  Pack years:  2.50  ?  Types: Cigarettes  ?  Quit date: 03/28/2015  ?  Years since quitting: 6.5  ? Smokeless tobacco: Never  ?Vaping Use  ? Vaping Use: Never used  ?Substance and Sexual Activity  ? Alcohol use: No  ? Drug use: Never  ? Sexual activity: Yes  ?  Partners: Male  ?  Birth control/protection: Surgical  ?Other Topics Concern  ? Not on file  ?Social History Narrative  ? Not on file  ? ?Social Determinants of Health  ? ?Financial Resource Strain: Not on file  ?Food Insecurity: Not on file  ?Transportation Needs: Not on file  ?Physical Activity: Not on file  ?Stress: Not on file  ?Social Connections: Not on file  ?Intimate Partner Violence: Not on file  ? ? ?FAMILY HISTORY:  ?Family History  ?Problem Relation Age of Onset  ? Hypertension Mother   ? Diabetes Mother   ? Cirrhosis Father   ? ? ?CURRENT MEDICATIONS:  ?Current Outpatient Medications  ?Medication Sig Dispense Refill  ? amLODipine (NORVASC) 5 MG tablet TAKE 1 TABLET (5 MG TOTAL) BY MOUTH DAILY. 90 tablet 1  ? BEVACIZUMAB IV Inject 5 mg/kg into the vein every 14 (fourteen) days.    ? fluorouracil CALGB 37169 in sodium chloride 0.9 % 150 mL Inject 2,400 mg/m2 into the vein over 48 hr.    ? gabapentin (NEURONTIN) 100 MG capsule Take 1 capsule (100 mg total) by mouth 3 (three) times daily. 90 capsule 3  ? LEUCOVORIN CALCIUM IV Inject 400 mg/m2 into the vein every 21 ( twenty-one) days.    ? lidocaine (XYLOCAINE) 2 % solution Use as directed 15 mLs in the mouth or throat every 6 (six) hours as needed for mouth pain. Swish and spit/swallow every six hours as needed for mouth pain 480 mL 3  ? lidocaine-prilocaine (EMLA) cream Apply small amount to port a cath site and cover with plastic wrap 1 hour prior to chemotherapy appointments 30 g 3  ? LORazepam (ATIVAN) 1 MG tablet TAKE 1 TABLET BY MOUTH EVERY 8 HOURS AS NEEDED FOR ANXIETY 90 tablet 1  ? nystatin (MYCOSTATIN) 100000 UNIT/ML suspension Take by mouth.    ? PARoxetine (PAXIL) 40 MG tablet Take 1 tablet (40 mg  total) by mouth every morning. 90 tablet 3  ? polyethylene glycol (MIRALAX / GLYCOLAX) packet Take 17 g by mouth every other day.    ? potassium chloride (KLOR-CON M) 10 MEQ tablet Take 2 tablets (20 mEq total) by mouth daily. 60 tablet 6  ? prochlorperazine (COMPAZINE) 10 MG tablet Take 1 tablet (10 mg total) by mouth every 6 (six) hours as needed (Nausea or vomiting). 30 tablet 1  ? triamterene-hydrochlorothiazide (MAXZIDE-25) 37.5-25 MG tablet TAKE 1 TABLET BY MOUTH EVERY DAY 30 tablet 3  ? ?No current facility-administered medications for this visit.  ? ? ?ALLERGIES:  ?No Known Allergies ? ?PHYSICAL EXAM:  ?Performance status (ECOG): 1 - Symptomatic but completely ambulatory ? ?There were no vitals filed  for this visit. ?Wt Readings from Last 3 Encounters:  ?09/30/21 167 lb (75.8 kg)  ?09/18/21 171 lb 6.4 oz (77.7 kg)  ?09/02/21 164 lb 14.5 oz (74.8 kg)  ? ?Physical Exam ?Vitals reviewed.  ?Constitutional:   ?   Appearance: Normal appearance.  ?Cardiovascular:  ?   Rate and Rhythm: Normal rate and regular rhythm.  ?   Pulses: Normal pulses.  ?   Heart sounds: Normal heart sounds.  ?Pulmonary:  ?   Effort: Pulmonary effort is normal.  ?   Breath sounds: Normal breath sounds.  ?Neurological:  ?   General: No focal deficit present.  ?   Mental Status: She is alert and oriented to person, place, and time.  ?Psychiatric:     ?   Mood and Affect: Mood normal.     ?   Behavior: Behavior normal.  ? ? ?LABORATORY DATA:  ?I have reviewed the labs as listed.  ? ?  Latest Ref Rng & Units 09/30/2021  ?  9:14 AM 09/16/2021  ?  9:10 AM 09/02/2021  ?  9:37 AM  ?CBC  ?WBC 4.0 - 10.5 K/uL 6.4   5.1   5.0    ?Hemoglobin 12.0 - 15.0 g/dL 14.6   13.5   13.4    ?Hematocrit 36.0 - 46.0 % 42.5   39.0   39.5    ?Platelets 150 - 400 K/uL 131   120   117    ? ? ?  Latest Ref Rng & Units 09/30/2021  ?  9:14 AM 09/16/2021  ?  9:10 AM 09/02/2021  ?  9:37 AM  ?CMP  ?Glucose 70 - 99 mg/dL 126   124   110    ?BUN 8 - 23 mg/dL _0 ?Creatinine  0.44 - 1.00 mg/dL 0.72   0.82   0.83    ?Sodium 135 - 145 mmol/L 135   135   135    ?Potassium 3.5 - 5.1 mmol/L 3.4   3.0   3.4    ?Chloride 98 - 111 mmol/L 101   99   98    ?CO2 22 - 32 mmol/L 25   27

## 2021-09-30 NOTE — Progress Notes (Signed)
Patient has been examined by Dr. Katragadda, and vital signs and labs have been reviewed. ANC, Creatinine, LFTs, hemoglobin, and platelets are within treatment parameters per M.D. - pt may proceed with treatment.    °

## 2021-09-30 NOTE — Patient Instructions (Addendum)
[place] at [facility] ?Discharge Instructions ? ? ?You were seen and examined today by Dr. Delton Coombes. ? ?He reviewed the results of your lab work which are normal/stable. ? ?We will proceed with your treatment today. ? ?We have increased the frequency of your Ativan 1 mg to every 6 hours. ? ?We will repeat a CT scan prior to your next office visit.  ? ?Return as scheduled.  ? ? ? ? ?Thank you for choosing [place] at [facility] to provide your oncology and hematology care.  To afford each patient quality time with our provider, please arrive at least 15 minutes before your scheduled appointment time.  ? ?If you have a lab appointment with the [facility] please come in thru the Main Entrance and check in at the main information desk. ? ?You need to re-schedule your appointment should you arrive 10 or more minutes late.  We strive to give you quality time with our providers, and arriving late affects you and other patients whose appointments are after yours.  Also, if you no show three or more times for appointments you may be dismissed from the clinic at the providers discretion.     ?Again, thank you for choosing [facility].  Our hope is that these requests will decrease the amount of time that you wait before being seen by our physicians.       ?_____________________________________________________________ ? ?Should you have questions after your visit to [facility], please contact our office at [phone] and follow the prompts.  Our office hours are 8:00 a.m. and 4:30 p.m. Monday - Friday.  Please note that voicemails left after 4:00 p.m. may not be returned until the following business day.  We are closed weekends and major holidays.  You do have access to a nurse 24-7, just call the main number to the clinic [phone] and do not press any options, hold on the line and a nurse will answer the phone.   ? ?For prescription  refill requests, have your pharmacy contact our office and allow 72 hours.   ? ?Due to Covid, you will need to wear a mask upon entering the hospital. If you do not have a mask, a mask will be given to you at the Main Entrance upon arrival. For doctor visits, patients may have 1 support person age 6 or older with them. For treatment visits, patients can not have anyone with them due to social distancing guidelines and our immunocompromised population.  ? ?   ?

## 2021-09-30 NOTE — Progress Notes (Signed)
Pt presents today for MVASI, Leuocovorin, and 5FU per provider's order. Vital signs and labs WNL for treatment today. ?Okay to proceed with treatment today per Dr.K ? ?Treatment given today per MD orders. Tolerated infusion without adverse affects. Vital signs stable. No complaints at this time. Discharged from clinic ambulatory in stable condition. Alert and oriented x 3. F/U with [facility] as scheduled.  5FU ambulatory pump infusing. ?

## 2021-10-01 LAB — CEA: CEA: 2.4 ng/mL (ref 0.0–4.7)

## 2021-10-02 ENCOUNTER — Inpatient Hospital Stay (HOSPITAL_COMMUNITY): Payer: Medicaid Other

## 2021-10-02 VITALS — BP 144/75 | HR 74 | Temp 97.9°F | Resp 18

## 2021-10-02 DIAGNOSIS — Z5111 Encounter for antineoplastic chemotherapy: Secondary | ICD-10-CM | POA: Diagnosis not present

## 2021-10-02 DIAGNOSIS — C189 Malignant neoplasm of colon, unspecified: Secondary | ICD-10-CM

## 2021-10-02 MED ORDER — HEPARIN SOD (PORK) LOCK FLUSH 100 UNIT/ML IV SOLN
500.0000 [IU] | Freq: Once | INTRAVENOUS | Status: AC | PRN
  Administered 2021-10-02: 500 [IU]

## 2021-10-02 MED ORDER — SODIUM CHLORIDE 0.9% FLUSH
10.0000 mL | INTRAVENOUS | Status: DC | PRN
  Administered 2021-10-02: 10 mL

## 2021-10-02 NOTE — Patient Instructions (Signed)
[facility]  Discharge Instructions: ?Thank you for choosing [facility] to provide your oncology and hematology care.  ?If you have a lab appointment with the [facility], please come in thru the Main Entrance and check in at the main information desk. ? ?Wear comfortable clothing and clothing appropriate for easy access to any Portacath or PICC line.  ? ?We strive to give you quality time with your provider. You may need to reschedule your appointment if you arrive late (15 or more minutes).  Arriving late affects you and other patients whose appointments are after yours.  Also, if you miss three or more appointments without notifying the office, you may be dismissed from the clinic at the provider?s discretion.    ?  ?For prescription refill requests, have your pharmacy contact our office and allow 72 hours for refills to be completed.   ? ?Today you received the following chemotherapy and/or immunotherapy agents Pump stop and PORT deaccess    ?  ?To help prevent nausea and vomiting after your treatment, we encourage you to take your nausea medication as directed. ? ?BELOW ARE SYMPTOMS THAT SHOULD BE REPORTED IMMEDIATELY: ?*FEVER GREATER THAN 100.4 F (38 ?C) OR HIGHER ?*CHILLS OR SWEATING ?*NAUSEA AND VOMITING THAT IS NOT CONTROLLED WITH YOUR NAUSEA MEDICATION ?*UNUSUAL SHORTNESS OF BREATH ?*UNUSUAL BRUISING OR BLEEDING ?*URINARY PROBLEMS (pain or burning when urinating, or frequent urination) ?*BOWEL PROBLEMS (unusual diarrhea, constipation, pain near the anus) ?TENDERNESS IN MOUTH AND THROAT WITH OR WITHOUT PRESENCE OF ULCERS (sore throat, sores in mouth, or a toothache) ?UNUSUAL RASH, SWELLING OR PAIN  ?UNUSUAL VAGINAL DISCHARGE OR ITCHING  ? ?Items with * indicate a potential emergency and should be followed up as soon as possible or go to the Emergency Department if any problems should occur. ? ?Please show the CHEMOTHERAPY ALERT CARD or IMMUNOTHERAPY ALERT CARD at check-in to  the Emergency Department and triage nurse. ? ?Should you have questions after your visit or need to cancel or reschedule your appointment, please contact [facility] [phone]  and follow the prompts.  Office hours are 8:00 a.m. to 4:30 p.m. Monday - Friday. Please note that voicemails left after 4:00 p.m. may not be returned until the following business day.  We are closed weekends and major holidays. You have access to a nurse at all times for urgent questions. Please call the main number to the clinic [phone] and follow the prompts. ? ?For any non-urgent questions, you may also contact your provider using MyChart. We now offer e-Visits for anyone 49 and older to request care online for non-urgent symptoms. For details visit [email or web address]. ?  ?Also download the MyChart app! Go to the app store, search "MyChart", open the app, select [facility], and log in with your MyChart username and password. ? ?Due to Covid, a mask is required upon entering the hospital/clinic. If you do not have a mask, one will be given to you upon arrival. For doctor visits, patients may have 1 support person aged 81 or older with them. For treatment visits, patients cannot have anyone with them due to current Covid guidelines and our immunocompromised population.  ?

## 2021-10-02 NOTE — Progress Notes (Signed)
Patient presents today for 5FU pump stop and disconnection after 46 hour continous infusion.   5FU pump deaccessed.  Patients port flushed without difficulty.  Good blood return noted with no bruising or swelling noted at site.  needle removed intact.  Band aid applied.  VSS with discharge and left in satisfactory condition ambulatory with no s/s of distress noted.    ?

## 2021-10-07 ENCOUNTER — Other Ambulatory Visit (HOSPITAL_COMMUNITY): Payer: Self-pay | Admitting: Hematology

## 2021-10-07 DIAGNOSIS — F419 Anxiety disorder, unspecified: Secondary | ICD-10-CM

## 2021-10-08 ENCOUNTER — Encounter (HOSPITAL_COMMUNITY): Payer: Self-pay | Admitting: Hematology

## 2021-10-14 ENCOUNTER — Inpatient Hospital Stay (HOSPITAL_COMMUNITY): Payer: Medicaid Other

## 2021-10-14 ENCOUNTER — Other Ambulatory Visit (HOSPITAL_COMMUNITY): Payer: Self-pay | Admitting: *Deleted

## 2021-10-14 VITALS — BP 155/85 | HR 84 | Temp 98.2°F | Resp 18

## 2021-10-14 DIAGNOSIS — F419 Anxiety disorder, unspecified: Secondary | ICD-10-CM

## 2021-10-14 DIAGNOSIS — C787 Secondary malignant neoplasm of liver and intrahepatic bile duct: Secondary | ICD-10-CM

## 2021-10-14 DIAGNOSIS — Z5111 Encounter for antineoplastic chemotherapy: Secondary | ICD-10-CM | POA: Diagnosis not present

## 2021-10-14 LAB — CBC WITH DIFFERENTIAL/PLATELET
Abs Immature Granulocytes: 0.01 10*3/uL (ref 0.00–0.07)
Basophils Absolute: 0 10*3/uL (ref 0.0–0.1)
Basophils Relative: 1 %
Eosinophils Absolute: 0.1 10*3/uL (ref 0.0–0.5)
Eosinophils Relative: 1 %
HCT: 41.8 % (ref 36.0–46.0)
Hemoglobin: 14.2 g/dL (ref 12.0–15.0)
Immature Granulocytes: 0 %
Lymphocytes Relative: 24 %
Lymphs Abs: 1.1 10*3/uL (ref 0.7–4.0)
MCH: 31.8 pg (ref 26.0–34.0)
MCHC: 34 g/dL (ref 30.0–36.0)
MCV: 93.7 fL (ref 80.0–100.0)
Monocytes Absolute: 0.4 10*3/uL (ref 0.1–1.0)
Monocytes Relative: 10 %
Neutro Abs: 2.9 10*3/uL (ref 1.7–7.7)
Neutrophils Relative %: 64 %
Platelets: 137 10*3/uL — ABNORMAL LOW (ref 150–400)
RBC: 4.46 MIL/uL (ref 3.87–5.11)
RDW: 15 % (ref 11.5–15.5)
WBC: 4.4 10*3/uL (ref 4.0–10.5)
nRBC: 0 % (ref 0.0–0.2)

## 2021-10-14 LAB — COMPREHENSIVE METABOLIC PANEL
ALT: 48 U/L — ABNORMAL HIGH (ref 0–44)
AST: 50 U/L — ABNORMAL HIGH (ref 15–41)
Albumin: 3.9 g/dL (ref 3.5–5.0)
Alkaline Phosphatase: 81 U/L (ref 38–126)
Anion gap: 10 (ref 5–15)
BUN: 13 mg/dL (ref 8–23)
CO2: 25 mmol/L (ref 22–32)
Calcium: 9.7 mg/dL (ref 8.9–10.3)
Chloride: 101 mmol/L (ref 98–111)
Creatinine, Ser: 0.91 mg/dL (ref 0.44–1.00)
GFR, Estimated: >60 mL/min (ref >60)
Glucose, Bld: 132 mg/dL — ABNORMAL HIGH (ref 70–99)
Potassium: 3.2 mmol/L — ABNORMAL LOW (ref 3.5–5.1)
Sodium: 136 mmol/L (ref 135–145)
Total Bilirubin: 1 mg/dL (ref 0.3–1.2)
Total Protein: 7.9 g/dL (ref 6.5–8.1)

## 2021-10-14 LAB — URINALYSIS, DIPSTICK ONLY
Bilirubin Urine: NEGATIVE
Glucose, UA: NEGATIVE mg/dL
Hgb urine dipstick: NEGATIVE
Ketones, ur: NEGATIVE mg/dL
Leukocytes,Ua: NEGATIVE
Nitrite: NEGATIVE
Protein, ur: 30 mg/dL — AB
Specific Gravity, Urine: 1.015 (ref 1.005–1.030)
pH: 7 (ref 5.0–8.0)

## 2021-10-14 LAB — MAGNESIUM: Magnesium: 1.9 mg/dL (ref 1.7–2.4)

## 2021-10-14 MED ORDER — FLUOROURACIL CHEMO INJECTION 2.5 GM/50ML
320.0000 mg/m2 | Freq: Once | INTRAVENOUS | Status: AC
  Administered 2021-10-14: 550 mg via INTRAVENOUS
  Filled 2021-10-14: qty 11

## 2021-10-14 MED ORDER — SODIUM CHLORIDE 0.9 % IV SOLN
5.0000 mg/kg | Freq: Once | INTRAVENOUS | Status: AC
  Administered 2021-10-14: 400 mg via INTRAVENOUS
  Filled 2021-10-14: qty 16

## 2021-10-14 MED ORDER — LORAZEPAM 1 MG PO TABS
1.0000 mg | ORAL_TABLET | Freq: Once | ORAL | Status: AC
  Administered 2021-10-14: 1 mg via ORAL
  Filled 2021-10-14: qty 1

## 2021-10-14 MED ORDER — SODIUM CHLORIDE 0.9% FLUSH
10.0000 mL | INTRAVENOUS | Status: DC | PRN
  Administered 2021-10-14: 10 mL

## 2021-10-14 MED ORDER — LORAZEPAM 1 MG PO TABS: 1.0000 mg | ORAL_TABLET | Freq: Four times a day (QID) | ORAL | 1 refills | Status: DC | PRN

## 2021-10-14 MED ORDER — SODIUM CHLORIDE 0.9 % IV SOLN
400.0000 mg/m2 | Freq: Once | INTRAVENOUS | Status: AC
  Administered 2021-10-14: 680 mg via INTRAVENOUS
  Filled 2021-10-14: qty 34

## 2021-10-14 MED ORDER — PALONOSETRON HCL INJECTION 0.25 MG/5ML
0.2500 mg | Freq: Once | INTRAVENOUS | Status: AC
  Administered 2021-10-14: 0.25 mg via INTRAVENOUS
  Filled 2021-10-14: qty 5

## 2021-10-14 MED ORDER — SODIUM CHLORIDE 0.9 % IV SOLN
10.0000 mg | Freq: Once | INTRAVENOUS | Status: AC
  Administered 2021-10-14: 10 mg via INTRAVENOUS
  Filled 2021-10-14: qty 10

## 2021-10-14 MED ORDER — SODIUM CHLORIDE 0.9 % IV SOLN
1920.0000 mg/m2 | INTRAVENOUS | Status: DC
  Administered 2021-10-14: 3250 mg via INTRAVENOUS
  Filled 2021-10-14: qty 65

## 2021-10-14 MED ORDER — SODIUM CHLORIDE 0.9 % IV SOLN: Freq: Once | INTRAVENOUS | Status: AC

## 2021-10-14 NOTE — Progress Notes (Signed)
Spoke to patient regarding her Ativan prescription.  Per Dr. Tomie China note on 09/30/21 the frequency of her Ativan was increased from Q 6 H prn from Q 8 hours.  She is very teary and sad today, stating that she has not had a dose in several days, as pharmacy was not able to fill until the 30 th.  New prescription sent in for Ativan 1 mg Q 6 hours prn and confirmed with pharmacy that they would fill today.  Patient made aware and one time dose given in clinic, per Dr. Tomie China request. ?

## 2021-10-14 NOTE — Progress Notes (Signed)
Patients port flushed without difficulty.  Good blood return noted with no bruising or swelling noted at site.  Stable during access and blood draw.  Patient to remain accessed for treatment. 

## 2021-10-14 NOTE — Progress Notes (Signed)
Ok to start treatment without urinalysis results verbal order Dr. Delton Coombes.  Pharmacy notified.   Urine sent for check at 1050.   ? ?Patient tolerated chemotherapy with no complaints voiced.  Side effects with management reviewed with understanding verbalized.  Port site clean and dry with no bruising or swelling noted at site.  Good blood return noted before and after administration of chemotherapy.  Chemotherapy pump connected with no alarms noted.  Patient left in satisfactory condition with VSS and no s/s of distress noted.   ? ?

## 2021-10-14 NOTE — Patient Instructions (Signed)
[facility]  Discharge Instructions: ?Thank you for choosing [facility] to provide your oncology and hematology care.  ?If you have a lab appointment with the [facility], please come in thru the Main Entrance and check in at the main information desk. ? ?Wear comfortable clothing and clothing appropriate for easy access to any Portacath or PICC line.  ? ?We strive to give you quality time with your provider. You may need to reschedule your appointment if you arrive late (15 or more minutes).  Arriving late affects you and other patients whose appointments are after yours.  Also, if you miss three or more appointments without notifying the office, you may be dismissed from the clinic at the provider?s discretion.    ?  ?For prescription refill requests, have your pharmacy contact our office and allow 72 hours for refills to be completed.   ? ?Today you received the following chemotherapy and/or immunotherapy agents avastin and leucovorin and adruicil.  ?  ?To help prevent nausea and vomiting after your treatment, we encourage you to take your nausea medication as directed. ? ?BELOW ARE SYMPTOMS THAT SHOULD BE REPORTED IMMEDIATELY: ?*FEVER GREATER THAN 100.4 F (38 ?C) OR HIGHER ?*CHILLS OR SWEATING ?*NAUSEA AND VOMITING THAT IS NOT CONTROLLED WITH YOUR NAUSEA MEDICATION ?*UNUSUAL SHORTNESS OF BREATH ?*UNUSUAL BRUISING OR BLEEDING ?*URINARY PROBLEMS (pain or burning when urinating, or frequent urination) ?*BOWEL PROBLEMS (unusual diarrhea, constipation, pain near the anus) ?TENDERNESS IN MOUTH AND THROAT WITH OR WITHOUT PRESENCE OF ULCERS (sore throat, sores in mouth, or a toothache) ?UNUSUAL RASH, SWELLING OR PAIN  ?UNUSUAL VAGINAL DISCHARGE OR ITCHING  ? ?Items with * indicate a potential emergency and should be followed up as soon as possible or go to the Emergency Department if any problems should occur. ? ?Please show the CHEMOTHERAPY ALERT CARD or IMMUNOTHERAPY ALERT CARD at  check-in to the Emergency Department and triage nurse. ? ?Should you have questions after your visit or need to cancel or reschedule your appointment, please contact [facility] [phone]  and follow the prompts.  Office hours are 8:00 a.m. to 4:30 p.m. Monday - Friday. Please note that voicemails left after 4:00 p.m. may not be returned until the following business day.  We are closed weekends and major holidays. You have access to a nurse at all times for urgent questions. Please call the main number to the clinic [phone] and follow the prompts. ? ?For any non-urgent questions, you may also contact your provider using MyChart. We now offer e-Visits for anyone 8 and older to request care online for non-urgent symptoms. For details visit [email or web address]. ?  ?Also download the MyChart app! Go to the app store, search "MyChart", open the app, select [facility], and log in with your MyChart username and password. ? ?Due to Covid, a mask is required upon entering the hospital/clinic. If you do not have a mask, one will be given to you upon arrival. For doctor visits, patients may have 1 support person aged 72 or older with them. For treatment visits, patients cannot have anyone with them due to current Covid guidelines and our immunocompromised population.  ?

## 2021-10-16 ENCOUNTER — Inpatient Hospital Stay (HOSPITAL_COMMUNITY): Payer: Medicaid Other

## 2021-10-16 VITALS — BP 148/87 | HR 80 | Temp 97.8°F | Resp 18

## 2021-10-16 DIAGNOSIS — Z5111 Encounter for antineoplastic chemotherapy: Secondary | ICD-10-CM | POA: Diagnosis not present

## 2021-10-16 DIAGNOSIS — C189 Malignant neoplasm of colon, unspecified: Secondary | ICD-10-CM

## 2021-10-16 MED ORDER — HEPARIN SOD (PORK) LOCK FLUSH 100 UNIT/ML IV SOLN
500.0000 [IU] | Freq: Once | INTRAVENOUS | Status: AC | PRN
  Administered 2021-10-16: 500 [IU]

## 2021-10-16 MED ORDER — SODIUM CHLORIDE 0.9% FLUSH
10.0000 mL | INTRAVENOUS | Status: DC | PRN
  Administered 2021-10-16: 10 mL

## 2021-10-16 NOTE — Patient Instructions (Signed)
[facility]  Discharge Instructions: Thank you for choosing [facility] to provide your oncology and hematology care.  If you have a lab appointment with the Cancer Center, please come in thru the Main Entrance and check in at the main information desk.  Wear comfortable clothing and clothing appropriate for easy access to any Portacath or PICC line.   We strive to give you quality time with your provider. You may need to reschedule your appointment if you arrive late (15 or more minutes).  Arriving late affects you and other patients whose appointments are after yours.  Also, if you miss three or more appointments without notifying the office, you may be dismissed from the clinic at the provider's discretion.      For prescription refill requests, have your pharmacy contact our office and allow 72 hours for refills to be completed.        To help prevent nausea and vomiting after your treatment, we encourage you to take your nausea medication as directed.  BELOW ARE SYMPTOMS THAT SHOULD BE REPORTED IMMEDIATELY: *FEVER GREATER THAN 100.4 F (38 C) OR HIGHER *CHILLS OR SWEATING *NAUSEA AND VOMITING THAT IS NOT CONTROLLED WITH YOUR NAUSEA MEDICATION *UNUSUAL SHORTNESS OF BREATH *UNUSUAL BRUISING OR BLEEDING *URINARY PROBLEMS (pain or burning when urinating, or frequent urination) *BOWEL PROBLEMS (unusual diarrhea, constipation, pain near the anus) TENDERNESS IN MOUTH AND THROAT WITH OR WITHOUT PRESENCE OF ULCERS (sore throat, sores in mouth, or a toothache) UNUSUAL RASH, SWELLING OR PAIN  UNUSUAL VAGINAL DISCHARGE OR ITCHING   Items with * indicate a potential emergency and should be followed up as soon as possible or go to the Emergency Department if any problems should occur.  Please show the CHEMOTHERAPY ALERT CARD or IMMUNOTHERAPY ALERT CARD at check-in to the Emergency Department and triage nurse.  Should you have questions after your visit or need to cancel  or reschedule your appointment, please contact [facility] [phone]  and follow the prompts.  Office hours are 8:00 a.m. to 4:30 p.m. Monday - Friday. Please note that voicemails left after 4:00 p.m. may not be returned until the following business day.  We are closed weekends and major holidays. You have access to a nurse at all times for urgent questions. Please call the main number to the clinic [phone] and follow the prompts.  For any non-urgent questions, you may also contact your provider using MyChart. We now offer e-Visits for anyone 18 and older to request care online for non-urgent symptoms. For details visit [email or web address].   Also download the MyChart app! Go to the app store, search "MyChart", open the app, select [facility], and log in with your MyChart username and password.  Due to Covid, a mask is required upon entering the hospital/clinic. If you do not have a mask, one will be given to you upon arrival. For doctor visits, patients may have 1 support person aged 18 or older with them. For treatment visits, patients cannot have anyone with them due to current Covid guidelines and our immunocompromised population.  

## 2021-10-16 NOTE — Progress Notes (Signed)
Patient presents today for home infusion 5FU pump disconnection.  Patient is in satisfactory condition with no complaints voiced.  Vital signs are stable.  ? ?Home infusion pump disconnected with no difficulties.  Port flushed with no difficulties and good blood return noted.  Patient left ambulatory in stable condition.   ?

## 2021-10-21 ENCOUNTER — Ambulatory Visit
Admission: RE | Admit: 2021-10-21 | Discharge: 2021-10-21 | Disposition: A | Discharge status: Home / Self Care | Payer: Medicaid Other | Source: Ambulatory Visit | Attending: Hematology | Admitting: Hematology

## 2021-10-21 DIAGNOSIS — C787 Secondary malignant neoplasm of liver and intrahepatic bile duct: Secondary | ICD-10-CM | POA: Insufficient documentation

## 2021-10-21 DIAGNOSIS — C189 Malignant neoplasm of colon, unspecified: Secondary | ICD-10-CM | POA: Diagnosis not present

## 2021-10-21 MED ORDER — IOHEXOL 300 MG/ML  SOLN
100.0000 mL | Freq: Once | INTRAMUSCULAR | Status: AC | PRN
  Administered 2021-10-21: 100 mL via INTRAVENOUS

## 2021-10-28 ENCOUNTER — Inpatient Hospital Stay (HOSPITAL_COMMUNITY): Payer: Medicaid Other | Attending: Hematology | Admitting: Hematology

## 2021-10-28 ENCOUNTER — Other Ambulatory Visit (HOSPITAL_COMMUNITY): Payer: Self-pay | Admitting: Hematology

## 2021-10-28 ENCOUNTER — Inpatient Hospital Stay (HOSPITAL_COMMUNITY): Payer: Medicaid Other

## 2021-10-28 VITALS — BP 143/81 | HR 84 | Temp 97.9°F | Resp 18

## 2021-10-28 DIAGNOSIS — Z809 Family history of malignant neoplasm, unspecified: Secondary | ICD-10-CM | POA: Diagnosis not present

## 2021-10-28 DIAGNOSIS — K1230 Oral mucositis (ulcerative), unspecified: Secondary | ICD-10-CM | POA: Insufficient documentation

## 2021-10-28 DIAGNOSIS — E876 Hypokalemia: Secondary | ICD-10-CM | POA: Insufficient documentation

## 2021-10-28 DIAGNOSIS — C787 Secondary malignant neoplasm of liver and intrahepatic bile duct: Secondary | ICD-10-CM

## 2021-10-28 DIAGNOSIS — C189 Malignant neoplasm of colon, unspecified: Secondary | ICD-10-CM

## 2021-10-28 DIAGNOSIS — D509 Iron deficiency anemia, unspecified: Secondary | ICD-10-CM | POA: Diagnosis not present

## 2021-10-28 DIAGNOSIS — G629 Polyneuropathy, unspecified: Secondary | ICD-10-CM | POA: Insufficient documentation

## 2021-10-28 DIAGNOSIS — C187 Malignant neoplasm of sigmoid colon: Secondary | ICD-10-CM | POA: Diagnosis not present

## 2021-10-28 DIAGNOSIS — Z87891 Personal history of nicotine dependence: Secondary | ICD-10-CM | POA: Insufficient documentation

## 2021-10-28 DIAGNOSIS — F419 Anxiety disorder, unspecified: Secondary | ICD-10-CM | POA: Insufficient documentation

## 2021-10-28 DIAGNOSIS — Z5111 Encounter for antineoplastic chemotherapy: Secondary | ICD-10-CM | POA: Diagnosis not present

## 2021-10-28 DIAGNOSIS — Z79899 Other long term (current) drug therapy: Secondary | ICD-10-CM | POA: Insufficient documentation

## 2021-10-28 DIAGNOSIS — I10 Essential (primary) hypertension: Secondary | ICD-10-CM | POA: Insufficient documentation

## 2021-10-28 LAB — COMPREHENSIVE METABOLIC PANEL
ALT: 33 U/L (ref 0–44)
AST: 35 U/L (ref 15–41)
Albumin: 3.9 g/dL (ref 3.5–5.0)
Alkaline Phosphatase: 80 U/L (ref 38–126)
Anion gap: 8 (ref 5–15)
BUN: 13 mg/dL (ref 8–23)
CO2: 26 mmol/L (ref 22–32)
Calcium: 9.2 mg/dL (ref 8.9–10.3)
Chloride: 102 mmol/L (ref 98–111)
Creatinine, Ser: 0.91 mg/dL (ref 0.44–1.00)
GFR, Estimated: >60 mL/min (ref >60)
Glucose, Bld: 138 mg/dL — ABNORMAL HIGH (ref 70–99)
Potassium: 3.4 mmol/L — ABNORMAL LOW (ref 3.5–5.1)
Sodium: 136 mmol/L (ref 135–145)
Total Bilirubin: 1.1 mg/dL (ref 0.3–1.2)
Total Protein: 7.7 g/dL (ref 6.5–8.1)

## 2021-10-28 LAB — CBC WITH DIFFERENTIAL/PLATELET
Abs Immature Granulocytes: 0.02 10*3/uL (ref 0.00–0.07)
Basophils Absolute: 0 10*3/uL (ref 0.0–0.1)
Basophils Relative: 1 %
Eosinophils Absolute: 0.1 10*3/uL (ref 0.0–0.5)
Eosinophils Relative: 2 %
HCT: 40.9 % (ref 36.0–46.0)
Hemoglobin: 14 g/dL (ref 12.0–15.0)
Immature Granulocytes: 0 %
Lymphocytes Relative: 23 %
Lymphs Abs: 1.2 10*3/uL (ref 0.7–4.0)
MCH: 32.1 pg (ref 26.0–34.0)
MCHC: 34.2 g/dL (ref 30.0–36.0)
MCV: 93.8 fL (ref 80.0–100.0)
Monocytes Absolute: 0.4 10*3/uL (ref 0.1–1.0)
Monocytes Relative: 8 %
Neutro Abs: 3.5 10*3/uL (ref 1.7–7.7)
Neutrophils Relative %: 66 %
Platelets: 121 10*3/uL — ABNORMAL LOW (ref 150–400)
RBC: 4.36 MIL/uL (ref 3.87–5.11)
RDW: 15.4 % (ref 11.5–15.5)
WBC: 5.3 10*3/uL (ref 4.0–10.5)
nRBC: 0 % (ref 0.0–0.2)

## 2021-10-28 LAB — MAGNESIUM: Magnesium: 2 mg/dL (ref 1.7–2.4)

## 2021-10-28 MED ORDER — FLUOROURACIL CHEMO INJECTION 2.5 GM/50ML
320.0000 mg/m2 | Freq: Once | INTRAVENOUS | Status: AC
  Administered 2021-10-28: 550 mg via INTRAVENOUS
  Filled 2021-10-28: qty 11

## 2021-10-28 MED ORDER — SODIUM CHLORIDE 0.9 % IV SOLN
5.0000 mg/kg | Freq: Once | INTRAVENOUS | Status: AC
  Administered 2021-10-28: 400 mg via INTRAVENOUS
  Filled 2021-10-28: qty 16

## 2021-10-28 MED ORDER — SODIUM CHLORIDE 0.9 % IV SOLN: Freq: Once | INTRAVENOUS | Status: AC

## 2021-10-28 MED ORDER — SODIUM CHLORIDE 0.9 % IV SOLN
10.0000 mg | Freq: Once | INTRAVENOUS | Status: AC
  Administered 2021-10-28: 10 mg via INTRAVENOUS
  Filled 2021-10-28: qty 10

## 2021-10-28 MED ORDER — PALONOSETRON HCL INJECTION 0.25 MG/5ML
0.2500 mg | Freq: Once | INTRAVENOUS | Status: AC
  Administered 2021-10-28: 0.25 mg via INTRAVENOUS
  Filled 2021-10-28: qty 5

## 2021-10-28 MED ORDER — SODIUM CHLORIDE 0.9 % IV SOLN
1920.0000 mg/m2 | INTRAVENOUS | Status: DC
  Administered 2021-10-28: 3250 mg via INTRAVENOUS
  Filled 2021-10-28: qty 65

## 2021-10-28 MED ORDER — SODIUM CHLORIDE 0.9 % IV SOLN
400.0000 mg/m2 | Freq: Once | INTRAVENOUS | Status: AC
  Administered 2021-10-28: 680 mg via INTRAVENOUS
  Filled 2021-10-28: qty 34

## 2021-10-28 NOTE — Patient Instructions (Signed)
[facility]  Discharge Instructions: ?Thank you for choosing [facility] to provide your oncology and hematology care.  ?If you have a lab appointment with the [facility], please come in thru the Main Entrance and check in at the main information desk. ? ?Wear comfortable clothing and clothing appropriate for easy access to any Portacath or PICC line.  ? ?We strive to give you quality time with your provider. You may need to reschedule your appointment if you arrive late (15 or more minutes).  Arriving late affects you and other patients whose appointments are after yours.  Also, if you miss three or more appointments without notifying the office, you may be dismissed from the clinic at the provider?s discretion.    ?  ?For prescription refill requests, have your pharmacy contact our office and allow 72 hours for refills to be completed.   ? ?Today you received the following chemotherapy and/or immunotherapy agents Avastin,Leucovorin, and 5FU. ?  ?To help prevent nausea and vomiting after your treatment, we encourage you to take your nausea medication as directed. ? ?BELOW ARE SYMPTOMS THAT SHOULD BE REPORTED IMMEDIATELY: ?*FEVER GREATER THAN 100.4 F (38 ?C) OR HIGHER ?*CHILLS OR SWEATING ?*NAUSEA AND VOMITING THAT IS NOT CONTROLLED WITH YOUR NAUSEA MEDICATION ?*UNUSUAL SHORTNESS OF BREATH ?*UNUSUAL BRUISING OR BLEEDING ?*URINARY PROBLEMS (pain or burning when urinating, or frequent urination) ?*BOWEL PROBLEMS (unusual diarrhea, constipation, pain near the anus) ?TENDERNESS IN MOUTH AND THROAT WITH OR WITHOUT PRESENCE OF ULCERS (sore throat, sores in mouth, or a toothache) ?UNUSUAL RASH, SWELLING OR PAIN  ?UNUSUAL VAGINAL DISCHARGE OR ITCHING  ? ?Items with * indicate a potential emergency and should be followed up as soon as possible or go to the Emergency Department if any problems should occur. ? ?Please show the CHEMOTHERAPY ALERT CARD or IMMUNOTHERAPY ALERT CARD at check-in to the  Emergency Department and triage nurse. ? ?Should you have questions after your visit or need to cancel or reschedule your appointment, please contact [facility] [phone]  and follow the prompts.  Office hours are 8:00 a.m. to 4:30 p.m. Monday - Friday. Please note that voicemails left after 4:00 p.m. may not be returned until the following business day.  We are closed weekends and major holidays. You have access to a nurse at all times for urgent questions. Please call the main number to the clinic [phone] and follow the prompts. ? ?For any non-urgent questions, you may also contact your provider using MyChart. We now offer e-Visits for anyone 54 and older to request care online for non-urgent symptoms. For details visit [email or web address]. ?  ?Also download the MyChart app! Go to the app store, search "MyChart", open the app, select [facility], and log in with your MyChart username and password. ? ?Due to Covid, a mask is required upon entering the hospital/clinic. If you do not have a mask, one will be given to you upon arrival. For doctor visits, patients may have 1 support person aged 91 or older with them. For treatment visits, patients cannot have anyone with them due to current Covid guidelines and our immunocompromised population.  ?

## 2021-10-28 NOTE — Progress Notes (Signed)
? ?[place] ?[address] ?[place], [place] [address] ? ? ?CLINIC:  ?Medical Oncology/Hematology ? ?PCP:  ?Patient, No Pcp Per (Inactive) ?None ?None ? ? 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 ?PRIOR THERAPY: Sigmoid colectomy and end colostomy on 04/29/2020 ? ?NGS Results: Foundation 1 KRAS wildtype, MS--stable, TMB 4 Muts/Mb ? ?CURRENT THERAPY: FOLFOX, Avastin & Aloxi every 2 weeks ? ?BRIEF ONCOLOGIC HISTORY:  ?Oncology History  ?Metastatic colon cancer to liver [facility])  ?05/16/2020 Initial Diagnosis  ? Metastatic colon cancer to liver [facility]) ? ?  ?06/03/2020 Genetic Testing  ? Foundation One: ? ? ?  ?06/04/2020 -  Chemotherapy  ? Patient is on Treatment Plan : COLORECTAL FOLFOX + Bevacizumab q14d  ? ?  ?  ? ? ?CANCER STAGING: ? Cancer Staging  ?No matching staging information was found for the patient. ? ?INTERVAL HISTORY:  ?[name], a 61 y.o. female, returns for routine follow-up and consideration for next cycle of chemotherapy. Susan Martin was last seen on 09/30/2021. ? ?Due for cycle #37 of FOLFOX + Bevacizumab today.  ? ?Overall, she tells me she has been feeling pretty well. She reports continued ativan which has been slightly improved with Ativan every 6 hours. She reports easy bruising on her arm. She denies hematochezia, hematuria, and nosebleeds. ? ?Overall, she feels ready for next cycle of chemo today.  ? ?REVIEW OF SYSTEMS:  ?Review of Systems  ?Constitutional:  Negative for appetite change and fatigue.  ?HENT:   Positive for mouth sores. Negative for nosebleeds.   ?Gastrointestinal:  Negative for blood in stool.  ?Genitourinary:  Negative for hematuria.   ?Psychiatric/Behavioral:  Positive for depression. The patient is nervous/anxious.   ?All other systems reviewed and are negative. ? ?PAST MEDICAL/SURGICAL HISTORY:  ?Past Medical History:  ?Diagnosis Date  ? 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 Delton Coombes  ? 04-29-2020  emergerency surgery for perforated colon s/p sigmoid colectomy w/ colostomy; dx  Stage IV colon cancer mets to liver  ? Anxiety   ? Depression   ? Hypertension   ? followed by dr Glo Herring and oncology  (05-27-2020 per pt does not have pcp yet)  ? IDA (iron deficiency anemia)   ? Pulmonary nodule, right   ? ?Past Surgical History:  ?Procedure Laterality Date  ? ABDOMINAL HYSTERECTOMY  03-31-2016   @[facility]   ? W/  BILATERAL SALPINOOPHORECTOMY   ? APPLICATION OF WOUND VAC N/A 04/29/2020  ? Procedure: APPLICATION OF WOUND VAC;  Surgeon: Kinsinger, Arta Bruce, MD;  Location: [place];  Service: General;  Laterality: N/A;  ? ENDOMETRIAL ABLATION    ? LAPAROTOMY N/A 04/29/2020  ? Procedure: EXPLORATORY LAPAROTOMY;  Surgeon: Kinsinger, Arta Bruce, MD;  Location: [place];  Service: General;  Laterality: N/A;  ? PARTIAL COLECTOMY N/A 04/29/2020  ? Procedure: PARTIAL COLECTOMY WITH END COLOSTOMY;  Surgeon: Kinsinger, Arta Bruce, MD;  Location: [place];  Service: General;  Laterality: N/A;  ? PORTACATH PLACEMENT Right 05/28/2020  ? Procedure: INSERTION PORT-A-CATH;  Surgeon: Kinsinger, Arta Bruce, MD;  Location: [facility];  Service: General;  Laterality: Right;  ? SALPINGOOPHORECTOMY Left 03/31/2016  ? Procedure: LEFT SALPINGO OOPHORECTOMY WITH FROZEN SECTION,  ABDOMINAL HYSTERECTOMY WITH BILATERAL SALPINGO-OOPHORECTOMY;  Surgeon: Jonnie Kind, MD;  Location: [facility];  Service: Gynecology;  Laterality: Left;  ? ? ?SOCIAL HISTORY:  ?Social History  ? ?Socioeconomic History  ? Marital status: Divorced  ?  Spouse name: Not on  file  ? Number of children: Not on file  ? Years of education: Not on file  ? Highest education level: Not on file  ?Occupational History  ? Not on file  ?Tobacco Use  ? Smoking status: Former  ?  Packs/day: 0.50  ?  Years: 5.00  ?  Pack years: 2.50  ?  Types: Cigarettes  ?  Quit date: 03/28/2015  ?  Years since quitting: 6.5  ? Smokeless tobacco: Never  ?Vaping Use  ? Vaping Use: Never used  ?Substance and  Sexual Activity  ? Alcohol use: No  ? Drug use: Never  ? Sexual activity: Yes  ?  Partners: Male  ?  Birth control/protection: Surgical  ?Other Topics Concern  ? Not on file  ?Social History Narrative  ? Not on file  ? ?Social Determinants of Health  ? ?Financial Resource Strain: Not on file  ?Food Insecurity: Not on file  ?Transportation Needs: Not on file  ?Physical Activity: Not on file  ?Stress: Not on file  ?Social Connections: Not on file  ?Intimate Partner Violence: Not on file  ? ? ?FAMILY HISTORY:  ?Family History  ?Problem Relation Age of Onset  ? Hypertension Mother   ? Diabetes Mother   ? Cirrhosis Father   ? ? ?CURRENT MEDICATIONS:  ?Current Outpatient Medications  ?Medication Sig Dispense Refill  ? amLODipine (NORVASC) 5 MG tablet TAKE 1 TABLET (5 MG TOTAL) BY MOUTH DAILY. 90 tablet 1  ? BEVACIZUMAB IV Inject 5 mg/kg into the vein every 14 (fourteen) days.    ? fluorouracil CALGB 96283 in sodium chloride 0.9 % 150 mL Inject 2,400 mg/m2 into the vein over 48 hr.    ? gabapentin (NEURONTIN) 100 MG capsule Take 1 capsule (100 mg total) by mouth 3 (three) times daily. 90 capsule 3  ? LEUCOVORIN CALCIUM IV Inject 400 mg/m2 into the vein every 21 ( twenty-one) days.    ? lidocaine (XYLOCAINE) 2 % solution Use as directed 15 mLs in the mouth or throat every 6 (six) hours as needed for mouth pain. Swish and spit/swallow every six hours as needed for mouth pain 480 mL 3  ? lidocaine-prilocaine (EMLA) cream Apply small amount to port a cath site and cover with plastic wrap 1 hour prior to chemotherapy appointments 30 g 3  ? LORazepam (ATIVAN) 1 MG tablet Take 1 tablet (1 mg total) by mouth every 6 (six) hours as needed. for anxiety 120 tablet 1  ? nystatin (MYCOSTATIN) 100000 UNIT/ML suspension Take by mouth.    ? PARoxetine (PAXIL) 40 MG tablet Take 1 tablet (40 mg total) by mouth every morning. 90 tablet 3  ? polyethylene glycol (MIRALAX / GLYCOLAX) packet Take 17 g by mouth every other day.    ? potassium  chloride (KLOR-CON M) 10 MEQ tablet Take 2 tablets (20 mEq total) by mouth daily. 60 tablet 6  ? prochlorperazine (COMPAZINE) 10 MG tablet Take 1 tablet (10 mg total) by mouth every 6 (six) hours as needed (Nausea or vomiting). 30 tablet 1  ? triamterene-hydrochlorothiazide (MAXZIDE-25) 37.5-25 MG tablet TAKE 1 TABLET BY MOUTH EVERY DAY 90 tablet 1  ? ?No current facility-administered medications for this visit.  ? ? ?ALLERGIES:  ?No Known Allergies ? ?PHYSICAL EXAM:  ?Performance status (ECOG): 1 - Symptomatic but completely ambulatory ? ?There were no vitals filed for this visit. ?Wt Readings from Last 3 Encounters:  ?10/28/21 167 lb 12.8 oz (76.1 kg)  ?10/14/21 164 lb 3.2 oz (74.5 kg)  ?  09/30/21 167 lb (75.8 kg)  ? ?Physical Exam ?Vitals reviewed.  ?Constitutional:   ?   Appearance: Normal appearance.  ?Cardiovascular:  ?   Rate and Rhythm: Normal rate and regular rhythm.  ?   Pulses: Normal pulses.  ?   Heart sounds: Normal heart sounds.  ?Pulmonary:  ?   Effort: Pulmonary effort is normal.  ?   Breath sounds: Normal breath sounds.  ?Neurological:  ?   General: No focal deficit present.  ?   Mental Status: She is alert and oriented to person, place, and time.  ?Psychiatric:     ?   Mood and Affect: Mood normal.     ?   Behavior: Behavior normal.  ? ? ?LABORATORY DATA:  ?I have reviewed the labs as listed.  ? ?  Latest Ref Rng & Units 10/28/2021  ?  9:09 AM 10/14/2021  ?  9:37 AM 09/30/2021  ?  9:14 AM  ?CBC  ?WBC 4.0 - 10.5 K/uL 5.3   4.4   6.4    ?Hemoglobin 12.0 - 15.0 g/dL 14.0   14.2   14.6    ?Hematocrit 36.0 - 46.0 % 40.9   41.8   42.5    ?Platelets 150 - 400 K/uL 121   137   131    ? ? ?  Latest Ref Rng & Units 10/28/2021  ?  9:09 AM 10/14/2021  ?  9:37 AM 09/30/2021  ?  9:14 AM  ?CMP  ?Glucose 70 - 99 mg/dL 138   132   126    ?BUN 8 - 23 mg/dL 13   13   12     ?Creatinine 0.44 - 1.00 mg/dL 0.91   0.91   0.72    ?Sodium 135 - 145 mmol/L 136   136   135    ?Potassium 3.5 - 5.1 mmol/L 3.4   3.2   3.4    ?Chloride 98 -  111 mmol/L 102   101   101    ?CO2 22 - 32 mmol/L 26   25   25     ?Calcium 8.9 - 10.3 mg/dL 9.2   9.7   9.1    ?Total Protein 6.5 - 8.1 g/dL 7.7   7.9   7.9    ?Total Bilirubin 0.3 - 1.2 mg/dL 1.1   1.0   0

## 2021-10-28 NOTE — Progress Notes (Signed)
Patient has been examined by Dr. Katragadda, and vital signs and labs have been reviewed. ANC, Creatinine, LFTs, hemoglobin, and platelets are within treatment parameters per M.D. - pt may proceed with treatment.    °

## 2021-10-28 NOTE — Progress Notes (Signed)
Pt presents today for MVASI, Leucovorin, and 5FU per provider's order.  Labs and vital signs WNL for treatment today. Okay to proceed with treatment today per Dr.K. ? ?MVASI, Leucovorin, and 5FU given today per MD orders. Tolerated infusion without adverse affects. Vital signs stable. No complaints at this time. Discharged from clinic ambulatory in stable condition. Alert and oriented x 3. F/U with [facility] as scheduled.  5FU ambulatory pump infusing. ?

## 2021-10-28 NOTE — Patient Instructions (Signed)
[place] at [facility] ?Discharge Instructions ? ? ?You were seen and examined today by Dr. Delton Coombes. ? ?He reviewed your lab work which is normal/stable. ? ?He reviewed the results of your CT scan which is stable.  ? ?We will proceed with your treatment today. ? ?We changed your anxiety medication to Klonopin 1 mg twice a day. STOP taking the Ativan while you are taking this medication.  ? ?Return as scheduled.  ? ? ?Thank you for choosing [place] at [facility] to provide your oncology and hematology care.  To afford each patient quality time with our provider, please arrive at least 15 minutes before your scheduled appointment time.  ? ?If you have a lab appointment with the [place] please come in thru the Main Entrance and check in at the main information desk. ? ?You need to re-schedule your appointment should you arrive 10 or more minutes late.  We strive to give you quality time with our providers, and arriving late affects you and other patients whose appointments are after yours.  Also, if you no show three or more times for appointments you may be dismissed from the clinic at the providers discretion.     ?Again, thank you for choosing [facility].  Our hope is that these requests will decrease the amount of time that you wait before being seen by our physicians.       ?_____________________________________________________________ ? ?Should you have questions after your visit to [facility], please contact our office at [phone] and follow the prompts.  Our office hours are 8:00 a.m. and 4:30 p.m. Monday - Friday.  Please note that voicemails left after 4:00 p.m. may not be returned until the following business day.  We are closed weekends and major holidays.  You do have access to a nurse 24-7, just call the main number to the clinic [phone] and do not press any options, hold on the line and a nurse will  answer the phone.   ? ?For prescription refill requests, have your pharmacy contact our office and allow 72 hours.   ? ?Due to Covid, you will need to wear a mask upon entering the hospital. If you do not have a mask, a mask will be given to you at the Main Entrance upon arrival. For doctor visits, patients may have 1 support person age 62 or older with them. For treatment visits, patients can not have anyone with them due to social distancing guidelines and our immunocompromised population.  ? ?   ?

## 2021-10-29 ENCOUNTER — Other Ambulatory Visit (HOSPITAL_COMMUNITY): Payer: Self-pay

## 2021-10-29 ENCOUNTER — Other Ambulatory Visit (HOSPITAL_COMMUNITY): Payer: Self-pay | Admitting: *Deleted

## 2021-10-29 DIAGNOSIS — F419 Anxiety disorder, unspecified: Secondary | ICD-10-CM

## 2021-10-29 MED ORDER — CLONAZEPAM 1 MG PO TABS: 1.0000 mg | ORAL_TABLET | Freq: Two times a day (BID) | ORAL | 0 refills | Status: DC

## 2021-10-30 ENCOUNTER — Inpatient Hospital Stay (HOSPITAL_COMMUNITY): Payer: Medicaid Other

## 2021-10-30 ENCOUNTER — Other Ambulatory Visit (HOSPITAL_COMMUNITY): Payer: Self-pay

## 2021-10-30 ENCOUNTER — Encounter (HOSPITAL_COMMUNITY): Payer: Self-pay

## 2021-10-30 VITALS — BP 135/95 | HR 81 | Temp 98.3°F | Resp 18

## 2021-10-30 DIAGNOSIS — C189 Malignant neoplasm of colon, unspecified: Secondary | ICD-10-CM

## 2021-10-30 DIAGNOSIS — F419 Anxiety disorder, unspecified: Secondary | ICD-10-CM

## 2021-10-30 DIAGNOSIS — Z5111 Encounter for antineoplastic chemotherapy: Secondary | ICD-10-CM | POA: Diagnosis not present

## 2021-10-30 MED ORDER — SODIUM CHLORIDE 0.9% FLUSH
10.0000 mL | INTRAVENOUS | Status: DC | PRN
  Administered 2021-10-30: 10 mL

## 2021-10-30 MED ORDER — HEPARIN SOD (PORK) LOCK FLUSH 100 UNIT/ML IV SOLN
500.0000 [IU] | Freq: Once | INTRAVENOUS | Status: AC | PRN
  Administered 2021-10-30: 500 [IU]

## 2021-10-30 MED ORDER — CLONAZEPAM 1 MG PO TABS: 1.0000 mg | ORAL_TABLET | Freq: Two times a day (BID) | ORAL | 0 refills | Status: DC

## 2021-10-30 NOTE — Patient Instructions (Signed)
[facility]  Discharge Instructions: Thank you for choosing [facility] to provide your oncology and hematology care.  If you have a lab appointment with the Cancer Center, please come in thru the Main Entrance and check in at the main information desk.  Wear comfortable clothing and clothing appropriate for easy access to any Portacath or PICC line.   We strive to give you quality time with your provider. You may need to reschedule your appointment if you arrive late (15 or more minutes).  Arriving late affects you and other patients whose appointments are after yours.  Also, if you miss three or more appointments without notifying the office, you may be dismissed from the clinic at the provider's discretion.      For prescription refill requests, have your pharmacy contact our office and allow 72 hours for refills to be completed.        To help prevent nausea and vomiting after your treatment, we encourage you to take your nausea medication as directed.  BELOW ARE SYMPTOMS THAT SHOULD BE REPORTED IMMEDIATELY: *FEVER GREATER THAN 100.4 F (38 C) OR HIGHER *CHILLS OR SWEATING *NAUSEA AND VOMITING THAT IS NOT CONTROLLED WITH YOUR NAUSEA MEDICATION *UNUSUAL SHORTNESS OF BREATH *UNUSUAL BRUISING OR BLEEDING *URINARY PROBLEMS (pain or burning when urinating, or frequent urination) *BOWEL PROBLEMS (unusual diarrhea, constipation, pain near the anus) TENDERNESS IN MOUTH AND THROAT WITH OR WITHOUT PRESENCE OF ULCERS (sore throat, sores in mouth, or a toothache) UNUSUAL RASH, SWELLING OR PAIN  UNUSUAL VAGINAL DISCHARGE OR ITCHING   Items with * indicate a potential emergency and should be followed up as soon as possible or go to the Emergency Department if any problems should occur.  Please show the CHEMOTHERAPY ALERT CARD or IMMUNOTHERAPY ALERT CARD at check-in to the Emergency Department and triage nurse.  Should you have questions after your visit or need to cancel  or reschedule your appointment, please contact [facility] [phone]  and follow the prompts.  Office hours are 8:00 a.m. to 4:30 p.m. Monday - Friday. Please note that voicemails left after 4:00 p.m. may not be returned until the following business day.  We are closed weekends and major holidays. You have access to a nurse at all times for urgent questions. Please call the main number to the clinic [phone] and follow the prompts.  For any non-urgent questions, you may also contact your provider using MyChart. We now offer e-Visits for anyone 18 and older to request care online for non-urgent symptoms. For details visit [email or web address].   Also download the MyChart app! Go to the app store, search "MyChart", open the app, select [place], and log in with your MyChart username and password.  Due to Covid, a mask is required upon entering the hospital/clinic. If you do not have a mask, one will be given to you upon arrival. For doctor visits, patients may have 1 support person aged 18 or older with them. For treatment visits, patients cannot have anyone with them due to current Covid guidelines and our immunocompromised population.  

## 2021-10-30 NOTE — Progress Notes (Signed)
Patient for chemotherapy pump disconnect with no complaints voiced.  Patients port flushed without difficulty.  Good blood return noted with no bruising or swelling noted at site.  Band aid applied.  VSS with discharge and left ambulatory with no s/s of distress noted.   

## 2021-11-11 ENCOUNTER — Inpatient Hospital Stay (HOSPITAL_COMMUNITY): Payer: Medicaid Other

## 2021-11-11 ENCOUNTER — Encounter (HOSPITAL_COMMUNITY): Payer: Self-pay

## 2021-11-11 VITALS — BP 144/84 | HR 76 | Temp 97.4°F | Resp 18 | Wt 171.0 lb

## 2021-11-11 DIAGNOSIS — C189 Malignant neoplasm of colon, unspecified: Secondary | ICD-10-CM

## 2021-11-11 DIAGNOSIS — Z5111 Encounter for antineoplastic chemotherapy: Secondary | ICD-10-CM | POA: Diagnosis not present

## 2021-11-11 LAB — CBC WITH DIFFERENTIAL/PLATELET
Abs Immature Granulocytes: 0.01 10*3/uL (ref 0.00–0.07)
Basophils Absolute: 0.1 10*3/uL (ref 0.0–0.1)
Basophils Relative: 1 %
Eosinophils Absolute: 0.1 10*3/uL (ref 0.0–0.5)
Eosinophils Relative: 2 %
HCT: 41.1 % (ref 36.0–46.0)
Hemoglobin: 13.9 g/dL (ref 12.0–15.0)
Immature Granulocytes: 0 %
Lymphocytes Relative: 25 %
Lymphs Abs: 1.6 10*3/uL (ref 0.7–4.0)
MCH: 31.7 pg (ref 26.0–34.0)
MCHC: 33.8 g/dL (ref 30.0–36.0)
MCV: 93.8 fL (ref 80.0–100.0)
Monocytes Absolute: 0.6 10*3/uL (ref 0.1–1.0)
Monocytes Relative: 10 %
Neutro Abs: 3.8 10*3/uL (ref 1.7–7.7)
Neutrophils Relative %: 62 %
Platelets: 128 10*3/uL — ABNORMAL LOW (ref 150–400)
RBC: 4.38 MIL/uL (ref 3.87–5.11)
RDW: 15.3 % (ref 11.5–15.5)
WBC: 6.2 10*3/uL (ref 4.0–10.5)
nRBC: 0 % (ref 0.0–0.2)

## 2021-11-11 LAB — COMPREHENSIVE METABOLIC PANEL
ALT: 26 U/L (ref 0–44)
AST: 30 U/L (ref 15–41)
Albumin: 3.8 g/dL (ref 3.5–5.0)
Alkaline Phosphatase: 84 U/L (ref 38–126)
Anion gap: 7 (ref 5–15)
BUN: 13 mg/dL (ref 8–23)
CO2: 26 mmol/L (ref 22–32)
Calcium: 9.1 mg/dL (ref 8.9–10.3)
Chloride: 101 mmol/L (ref 98–111)
Creatinine, Ser: 0.92 mg/dL (ref 0.44–1.00)
GFR, Estimated: >60 mL/min (ref >60)
Glucose, Bld: 115 mg/dL — ABNORMAL HIGH (ref 70–99)
Potassium: 3.5 mmol/L (ref 3.5–5.1)
Sodium: 134 mmol/L — ABNORMAL LOW (ref 135–145)
Total Bilirubin: 0.5 mg/dL (ref 0.3–1.2)
Total Protein: 7.7 g/dL (ref 6.5–8.1)

## 2021-11-11 LAB — MAGNESIUM: Magnesium: 2 mg/dL (ref 1.7–2.4)

## 2021-11-11 MED ORDER — SODIUM CHLORIDE 0.9 % IV SOLN
10.0000 mg | Freq: Once | INTRAVENOUS | Status: AC
  Administered 2021-11-11: 10 mg via INTRAVENOUS
  Filled 2021-11-11: qty 1

## 2021-11-11 MED ORDER — SODIUM CHLORIDE 0.9 % IV SOLN
5.0000 mg/kg | Freq: Once | INTRAVENOUS | Status: AC
  Administered 2021-11-11: 400 mg via INTRAVENOUS
  Filled 2021-11-11: qty 16

## 2021-11-11 MED ORDER — FLUOROURACIL CHEMO INJECTION 2.5 GM/50ML
320.0000 mg/m2 | Freq: Once | INTRAVENOUS | Status: AC
  Administered 2021-11-11: 550 mg via INTRAVENOUS
  Filled 2021-11-11: qty 11

## 2021-11-11 MED ORDER — SODIUM CHLORIDE 0.9 % IV SOLN: Freq: Once | INTRAVENOUS | Status: AC

## 2021-11-11 MED ORDER — PALONOSETRON HCL INJECTION 0.25 MG/5ML
0.2500 mg | Freq: Once | INTRAVENOUS | Status: AC
  Administered 2021-11-11: 0.25 mg via INTRAVENOUS
  Filled 2021-11-11: qty 5

## 2021-11-11 MED ORDER — SODIUM CHLORIDE 0.9% FLUSH: 10.0000 mL | INTRAVENOUS | Status: DC | PRN

## 2021-11-11 MED ORDER — SODIUM CHLORIDE 0.9 % IV SOLN
400.0000 mg/m2 | Freq: Once | INTRAVENOUS | Status: AC
  Administered 2021-11-11: 680 mg via INTRAVENOUS
  Filled 2021-11-11: qty 34

## 2021-11-11 MED ORDER — SODIUM CHLORIDE 0.9 % IV SOLN
1920.0000 mg/m2 | INTRAVENOUS | Status: DC
  Administered 2021-11-11: 3250 mg via INTRAVENOUS
  Filled 2021-11-11: qty 65

## 2021-11-11 NOTE — Progress Notes (Signed)
Patient tolerated chemotherapy with no complaints voiced. Side effects with management reviewed understanding verbalized. Port site clean and dry with no bruising or swelling noted at site. Good blood return noted before and after administration of chemotherapy. Chemo pump connected with no alarms noted. Patient left in satisfactory condition with VSS and no s/s of distress noted.  °

## 2021-11-11 NOTE — Progress Notes (Signed)
Patients port flushed without difficulty.  Good blood return noted with no bruising or swelling noted at site.  Stable during access and blood draw.  Patient to remain accessed for treatment. 

## 2021-11-11 NOTE — Patient Instructions (Signed)
Susan Martin  Discharge Instructions: ?Thank you for choosing [place] to provide your oncology and hematology care.  ?If you have a lab appointment with the [facility], please come in thru the Main Entrance and check in at the main information desk. ? ?Wear comfortable clothing and clothing appropriate for easy access to any Portacath or PICC line.  ? ?We strive to give you quality time with your provider. You may need to reschedule your appointment if you arrive late (15 or more minutes).  Arriving late affects you and other patients whose appointments are after yours.  Also, if you miss three or more appointments without notifying the office, you may be dismissed from the clinic at the provider?s discretion.    ?  ?For prescription refill requests, have your pharmacy contact our office and allow 72 hours for refills to be completed.   ? ?Today you received the following chemotherapy and/or immunotherapy agents Bevacizumab, Leucovorin and 5FU, return as scheduled. ?  ?To help prevent nausea and vomiting after your treatment, we encourage you to take your nausea medication as directed. ? ?BELOW ARE SYMPTOMS THAT SHOULD BE REPORTED IMMEDIATELY: ?*FEVER GREATER THAN 100.4 F (38 ?C) OR HIGHER ?*CHILLS OR SWEATING ?*NAUSEA AND VOMITING THAT IS NOT CONTROLLED WITH YOUR NAUSEA MEDICATION ?*UNUSUAL SHORTNESS OF BREATH ?*UNUSUAL BRUISING OR BLEEDING ?*URINARY PROBLEMS (pain or burning when urinating, or frequent urination) ?*BOWEL PROBLEMS (unusual diarrhea, constipation, pain near the anus) ?TENDERNESS IN MOUTH AND THROAT WITH OR WITHOUT PRESENCE OF ULCERS (sore throat, sores in mouth, or a toothache) ?UNUSUAL RASH, SWELLING OR PAIN  ?UNUSUAL VAGINAL DISCHARGE OR ITCHING  ? ?Items with * indicate a potential emergency and should be followed up as soon as possible or go to the Emergency Department if any problems should occur. ? ?Please show the CHEMOTHERAPY ALERT CARD or IMMUNOTHERAPY ALERT  CARD at check-in to the Emergency Department and triage nurse. ? ?Should you have questions after your visit or need to cancel or reschedule your appointment, please contact [facility] [phone]  and follow the prompts.  Office hours are 8:00 a.m. to 4:30 p.m. Monday - Friday. Please note that voicemails left after 4:00 p.m. may not be returned until the following business day.  We are closed weekends and major holidays. You have access to a nurse at all times for urgent questions. Please call the main number to the clinic [phone] and follow the prompts. ? ?For any non-urgent questions, you may also contact your provider using MyChart. We now offer e-Visits for anyone 35 and older to request care online for non-urgent symptoms. For details visit [email or web address]. ?  ?Also download the MyChart app! Go to the app store, search "MyChart", open the app, select [place], and log in with your MyChart username and password. ? ?Due to Covid, a mask is required upon entering the hospital/clinic. If you do not have a mask, one will be given to you upon arrival. For doctor visits, patients may have 1 support person aged 13 or older with them. For treatment visits, patients cannot have anyone with them due to current Covid guidelines and our immunocompromised population.  ?

## 2021-11-12 LAB — CEA: CEA: 1.8 ng/mL (ref 0.0–4.7)

## 2021-11-13 ENCOUNTER — Inpatient Hospital Stay (HOSPITAL_COMMUNITY): Payer: Medicaid Other

## 2021-11-13 ENCOUNTER — Other Ambulatory Visit: Payer: Self-pay

## 2021-11-13 VITALS — BP 146/90 | HR 66 | Temp 98.6°F | Resp 16

## 2021-11-13 DIAGNOSIS — C189 Malignant neoplasm of colon, unspecified: Secondary | ICD-10-CM

## 2021-11-13 DIAGNOSIS — F419 Anxiety disorder, unspecified: Secondary | ICD-10-CM

## 2021-11-13 DIAGNOSIS — Z5111 Encounter for antineoplastic chemotherapy: Secondary | ICD-10-CM | POA: Diagnosis not present

## 2021-11-13 MED ORDER — AMLODIPINE BESYLATE 5 MG PO TABS: 5.0000 mg | ORAL_TABLET | Freq: Every day | ORAL | 1 refills | Status: DC

## 2021-11-13 MED ORDER — HEPARIN SOD (PORK) LOCK FLUSH 100 UNIT/ML IV SOLN
500.0000 [IU] | Freq: Once | INTRAVENOUS | Status: AC | PRN
  Administered 2021-11-13: 500 [IU]

## 2021-11-13 MED ORDER — CLONAZEPAM 1 MG PO TABS: 1.0000 mg | ORAL_TABLET | Freq: Two times a day (BID) | ORAL | 0 refills | Status: DC

## 2021-11-13 MED ORDER — SODIUM CHLORIDE 0.9% FLUSH
10.0000 mL | INTRAVENOUS | Status: DC | PRN
  Administered 2021-11-13: 10 mL

## 2021-11-13 MED ORDER — PAROXETINE HCL 40 MG PO TABS: 40.0000 mg | ORAL_TABLET | ORAL | 3 refills | Status: DC

## 2021-11-13 NOTE — Patient Instructions (Signed)
[facility]  Discharge Instructions: Thank you for choosing [facility] to provide your oncology and hematology care.  If you have a lab appointment with the Cancer Center, please come in thru the Main Entrance and check in at the main information desk.  Wear comfortable clothing and clothing appropriate for easy access to any Portacath or PICC line.   We strive to give you quality time with your provider. You may need to reschedule your appointment if you arrive late (15 or more minutes).  Arriving late affects you and other patients whose appointments are after yours.  Also, if you miss three or more appointments without notifying the office, you may be dismissed from the clinic at the provider's discretion.      For prescription refill requests, have your pharmacy contact our office and allow 72 hours for refills to be completed.        To help prevent nausea and vomiting after your treatment, we encourage you to take your nausea medication as directed.  BELOW ARE SYMPTOMS THAT SHOULD BE REPORTED IMMEDIATELY: *FEVER GREATER THAN 100.4 F (38 C) OR HIGHER *CHILLS OR SWEATING *NAUSEA AND VOMITING THAT IS NOT CONTROLLED WITH YOUR NAUSEA MEDICATION *UNUSUAL SHORTNESS OF BREATH *UNUSUAL BRUISING OR BLEEDING *URINARY PROBLEMS (pain or burning when urinating, or frequent urination) *BOWEL PROBLEMS (unusual diarrhea, constipation, pain near the anus) TENDERNESS IN MOUTH AND THROAT WITH OR WITHOUT PRESENCE OF ULCERS (sore throat, sores in mouth, or a toothache) UNUSUAL RASH, SWELLING OR PAIN  UNUSUAL VAGINAL DISCHARGE OR ITCHING   Items with * indicate a potential emergency and should be followed up as soon as possible or go to the Emergency Department if any problems should occur.  Please show the CHEMOTHERAPY ALERT CARD or IMMUNOTHERAPY ALERT CARD at check-in to the Emergency Department and triage nurse.  Should you have questions after your visit or need to cancel  or reschedule your appointment, please contact [facility] [phone]  and follow the prompts.  Office hours are 8:00 a.m. to 4:30 p.m. Monday - Friday. Please note that voicemails left after 4:00 p.m. may not be returned until the following business day.  We are closed weekends and major holidays. You have access to a nurse at all times for urgent questions. Please call the main number to the clinic [phone] and follow the prompts.  For any non-urgent questions, you may also contact your provider using MyChart. We now offer e-Visits for anyone 18 and older to request care online for non-urgent symptoms. For details visit [email or web address].   Also download the MyChart app! Go to the app store, search "MyChart", open the app, select [place], and log in with your MyChart username and password.  Due to Covid, a mask is required upon entering the hospital/clinic. If you do not have a mask, one will be given to you upon arrival. For doctor visits, patients may have 1 support person aged 18 or older with them. For treatment visits, patients cannot have anyone with them due to current Covid guidelines and our immunocompromised population.  

## 2021-11-13 NOTE — Progress Notes (Signed)
Susan Martin presents to have home infusion pump d/c'd and for port-a-cath deaccess with flush.  Portacath located right chest wall accessed with  H 20 needle.  Good blood return present. Portacath flushed with NS and 500U/89m Heparin, and needle removed intact.  Procedure tolerated well and without incident.

## 2021-11-14 ENCOUNTER — Other Ambulatory Visit (HOSPITAL_COMMUNITY): Payer: Self-pay | Admitting: Hematology

## 2021-11-14 DIAGNOSIS — F419 Anxiety disorder, unspecified: Secondary | ICD-10-CM

## 2021-11-17 ENCOUNTER — Encounter (HOSPITAL_COMMUNITY): Payer: Self-pay | Admitting: Hematology

## 2021-11-21 ENCOUNTER — Encounter (HOSPITAL_COMMUNITY): Payer: Self-pay | Admitting: Hematology

## 2021-11-25 ENCOUNTER — Encounter (HOSPITAL_COMMUNITY): Payer: Self-pay

## 2021-11-25 ENCOUNTER — Inpatient Hospital Stay (HOSPITAL_COMMUNITY): Payer: Medicaid Other

## 2021-11-25 ENCOUNTER — Inpatient Hospital Stay (HOSPITAL_BASED_OUTPATIENT_CLINIC_OR_DEPARTMENT_OTHER): Payer: Medicaid Other | Admitting: Hematology

## 2021-11-25 VITALS — BP 130/79 | HR 72 | Temp 97.1°F | Resp 18

## 2021-11-25 VITALS — BP 143/73 | HR 72 | Temp 97.2°F | Resp 18 | Ht 63.0 in | Wt 172.8 lb

## 2021-11-25 DIAGNOSIS — C189 Malignant neoplasm of colon, unspecified: Secondary | ICD-10-CM | POA: Diagnosis not present

## 2021-11-25 DIAGNOSIS — E876 Hypokalemia: Secondary | ICD-10-CM

## 2021-11-25 DIAGNOSIS — C787 Secondary malignant neoplasm of liver and intrahepatic bile duct: Secondary | ICD-10-CM | POA: Diagnosis not present

## 2021-11-25 DIAGNOSIS — Z5111 Encounter for antineoplastic chemotherapy: Secondary | ICD-10-CM | POA: Diagnosis not present

## 2021-11-25 LAB — URINALYSIS, DIPSTICK ONLY
Bilirubin Urine: NEGATIVE
Glucose, UA: NEGATIVE mg/dL
Hgb urine dipstick: NEGATIVE
Ketones, ur: NEGATIVE mg/dL
Leukocytes,Ua: NEGATIVE
Nitrite: NEGATIVE
Protein, ur: NEGATIVE mg/dL
Specific Gravity, Urine: 1.018 (ref 1.005–1.030)
pH: 5 (ref 5.0–8.0)

## 2021-11-25 LAB — COMPREHENSIVE METABOLIC PANEL
ALT: 29 U/L (ref 0–44)
AST: 29 U/L (ref 15–41)
Albumin: 3.6 g/dL (ref 3.5–5.0)
Alkaline Phosphatase: 72 U/L (ref 38–126)
Anion gap: 6 (ref 5–15)
BUN: 13 mg/dL (ref 8–23)
CO2: 26 mmol/L (ref 22–32)
Calcium: 8.9 mg/dL (ref 8.9–10.3)
Chloride: 105 mmol/L (ref 98–111)
Creatinine, Ser: 0.96 mg/dL (ref 0.44–1.00)
GFR, Estimated: >60 mL/min (ref >60)
Glucose, Bld: 127 mg/dL — ABNORMAL HIGH (ref 70–99)
Potassium: 3.3 mmol/L — ABNORMAL LOW (ref 3.5–5.1)
Sodium: 137 mmol/L (ref 135–145)
Total Bilirubin: 0.2 mg/dL — ABNORMAL LOW (ref 0.3–1.2)
Total Protein: 6.9 g/dL (ref 6.5–8.1)

## 2021-11-25 LAB — CBC WITH DIFFERENTIAL/PLATELET
Abs Immature Granulocytes: 0.02 10*3/uL (ref 0.00–0.07)
Basophils Absolute: 0 10*3/uL (ref 0.0–0.1)
Basophils Relative: 1 %
Eosinophils Absolute: 0.1 10*3/uL (ref 0.0–0.5)
Eosinophils Relative: 3 %
HCT: 39 % (ref 36.0–46.0)
Hemoglobin: 13.4 g/dL (ref 12.0–15.0)
Immature Granulocytes: 1 %
Lymphocytes Relative: 34 %
Lymphs Abs: 1.5 10*3/uL (ref 0.7–4.0)
MCH: 32.4 pg (ref 26.0–34.0)
MCHC: 34.4 g/dL (ref 30.0–36.0)
MCV: 94.2 fL (ref 80.0–100.0)
Monocytes Absolute: 0.4 10*3/uL (ref 0.1–1.0)
Monocytes Relative: 9 %
Neutro Abs: 2.3 10*3/uL (ref 1.7–7.7)
Neutrophils Relative %: 52 %
Platelets: 124 10*3/uL — ABNORMAL LOW (ref 150–400)
RBC: 4.14 MIL/uL (ref 3.87–5.11)
RDW: 15.3 % (ref 11.5–15.5)
WBC: 4.4 10*3/uL (ref 4.0–10.5)
nRBC: 0 % (ref 0.0–0.2)

## 2021-11-25 LAB — MAGNESIUM: Magnesium: 2 mg/dL (ref 1.7–2.4)

## 2021-11-25 MED ORDER — POTASSIUM CHLORIDE CRYS ER 20 MEQ PO TBCR
40.0000 meq | EXTENDED_RELEASE_TABLET | Freq: Once | ORAL | Status: AC
  Administered 2021-11-25: 40 meq via ORAL
  Filled 2021-11-25: qty 2

## 2021-11-25 MED ORDER — SODIUM CHLORIDE 0.9 % IV SOLN
10.0000 mg | Freq: Once | INTRAVENOUS | Status: AC
  Administered 2021-11-25: 10 mg via INTRAVENOUS
  Filled 2021-11-25: qty 10

## 2021-11-25 MED ORDER — PALONOSETRON HCL INJECTION 0.25 MG/5ML
0.2500 mg | Freq: Once | INTRAVENOUS | Status: AC
  Administered 2021-11-25: 0.25 mg via INTRAVENOUS
  Filled 2021-11-25: qty 5

## 2021-11-25 MED ORDER — FLUOROURACIL CHEMO INJECTION 2.5 GM/50ML
320.0000 mg/m2 | Freq: Once | INTRAVENOUS | Status: AC
  Administered 2021-11-25: 550 mg via INTRAVENOUS
  Filled 2021-11-25: qty 11

## 2021-11-25 MED ORDER — SODIUM CHLORIDE 0.9 % IV SOLN
1920.0000 mg/m2 | INTRAVENOUS | Status: DC
  Administered 2021-11-25: 3250 mg via INTRAVENOUS
  Filled 2021-11-25: qty 65

## 2021-11-25 MED ORDER — SODIUM CHLORIDE 0.9 % IV SOLN
400.0000 mg/m2 | Freq: Once | INTRAVENOUS | Status: AC
  Administered 2021-11-25: 680 mg via INTRAVENOUS
  Filled 2021-11-25: qty 34

## 2021-11-25 MED ORDER — SODIUM CHLORIDE 0.9 % IV SOLN: Freq: Once | INTRAVENOUS | Status: AC

## 2021-11-25 MED ORDER — SODIUM CHLORIDE 0.9 % IV SOLN
5.0000 mg/kg | Freq: Once | INTRAVENOUS | Status: AC
  Administered 2021-11-25: 400 mg via INTRAVENOUS
  Filled 2021-11-25: qty 16

## 2021-11-25 MED ORDER — LIDOCAINE-PRILOCAINE 2.5-2.5 % EX CREA: TOPICAL_CREAM | CUTANEOUS | 3 refills | Status: DC

## 2021-11-25 NOTE — Patient Instructions (Signed)
[facility] at [facility] Discharge Instructions   You were seen and examined today by Dr. Katragadda.  He reviewed your lab work which is normal/stable.   We will proceed with your treatment today.  Return as scheduled.    Thank you for choosing [facility] at [facility] to provide your oncology and hematology care.  To afford each patient quality time with our provider, please arrive at least 15 minutes before your scheduled appointment time.   If you have a lab appointment with the Cancer Center please come in thru the Main Entrance and check in at the main information desk.  You need to re-schedule your appointment should you arrive 10 or more minutes late.  We strive to give you quality time with our providers, and arriving late affects you and other patients whose appointments are after yours.  Also, if you no show three or more times for appointments you may be dismissed from the clinic at the providers discretion.     Again, thank you for choosing [facility].  Our hope is that these requests will decrease the amount of time that you wait before being seen by our physicians.       _____________________________________________________________  Should you have questions after your visit to [facility], please contact our office at [phone] and follow the prompts.  Our office hours are 8:00 a.m. and 4:30 p.m. Monday - Friday.  Please note that voicemails left after 4:00 p.m. may not be returned until the following business day.  We are closed weekends and major holidays.  You do have access to a nurse 24-7, just call the main number to the clinic [phone] and do not press any options, hold on the line and a nurse will answer the phone.    For prescription refill requests, have your pharmacy contact our office and allow 72 hours.    Due to Covid, you will need to wear a mask upon entering the hospital. If  you do not have a mask, a mask will be given to you at the Main Entrance upon arrival. For doctor visits, patients may have 1 support person age 18 or older with them. For treatment visits, patients can not have anyone with them due to social distancing guidelines and our immunocompromised population.      

## 2021-11-25 NOTE — Progress Notes (Signed)
Patient has been examined by Dr. Delton Coombes, and vital signs and labs have been reviewed. ANC, Creatinine, LFTs, hemoglobin, and platelets are within treatment parameters per M.D. - pt may proceed with treatment.   We will give K+ 40 mEq po x 1 in clinic today for K+ of 3.3.

## 2021-11-25 NOTE — Patient Instructions (Signed)
[place]  Discharge Instructions: Thank you for choosing [facility] to provide your oncology and hematology care.  If you have a lab appointment with the [facility], please come in thru the Main Entrance and check in at the main information desk.  Wear comfortable clothing and clothing appropriate for easy access to any Portacath or PICC line.   We strive to give you quality time with your provider. You may need to reschedule your appointment if you arrive late (15 or more minutes).  Arriving late affects you and other patients whose appointments are after yours.  Also, if you miss three or more appointments without notifying the office, you may be dismissed from the clinic at the provider's discretion.      For prescription refill requests, have your pharmacy contact our office and allow 72 hours for refills to be completed.    Today you received the following chemotherapy and/or immunotherapy agents MVASI, Leucovorin, 5FU.       To help prevent nausea and vomiting after your treatment, we encourage you to take your nausea medication as directed.  BELOW ARE SYMPTOMS THAT SHOULD BE REPORTED IMMEDIATELY: *FEVER GREATER THAN 100.4 F (38 C) OR HIGHER *CHILLS OR SWEATING *NAUSEA AND VOMITING THAT IS NOT CONTROLLED WITH YOUR NAUSEA MEDICATION *UNUSUAL SHORTNESS OF BREATH *UNUSUAL BRUISING OR BLEEDING *URINARY PROBLEMS (pain or burning when urinating, or frequent urination) *BOWEL PROBLEMS (unusual diarrhea, constipation, pain near the anus) TENDERNESS IN MOUTH AND THROAT WITH OR WITHOUT PRESENCE OF ULCERS (sore throat, sores in mouth, or a toothache) UNUSUAL RASH, SWELLING OR PAIN  UNUSUAL VAGINAL DISCHARGE OR ITCHING   Items with * indicate a potential emergency and should be followed up as soon as possible or go to the Emergency Department if any problems should occur.  Please show the CHEMOTHERAPY ALERT CARD or IMMUNOTHERAPY ALERT CARD at check-in to the  Emergency Department and triage nurse.  Should you have questions after your visit or need to cancel or reschedule your appointment, please contact [facility] [phone]  and follow the prompts.  Office hours are 8:00 a.m. to 4:30 p.m. Monday - Friday. Please note that voicemails left after 4:00 p.m. may not be returned until the following business day.  We are closed weekends and major holidays. You have access to a nurse at all times for urgent questions. Please call the main number to the clinic [phone] and follow the prompts.  For any non-urgent questions, you may also contact your provider using MyChart. We now offer e-Visits for anyone 56 and older to request care online for non-urgent symptoms. For details visit [email or web address].   Also download the MyChart app! Go to the app store, search "MyChart", open the app, select [facility], and log in with your MyChart username and password.  Due to Covid, a mask is required upon entering the hospital/clinic. If you do not have a mask, one will be given to you upon arrival. For doctor visits, patients may have 1 support person aged 18 or older with them. For treatment visits, patients cannot have anyone with them due to current Covid guidelines and our immunocompromised population.   The chemotherapy medication bag should finish at 46 hours, 96 hours, or 7 days. For example, if your pump is scheduled for 46 hours and it was put on at 4:00 p.m., it should finish at 2:00 p.m. the day it is scheduled to come off regardless of your appointment time.  Estimated time to finish at 11:00 am.   If the display on your pump reads "Low Volume" and it is beeping, take the batteries out of the pump and come to the cancer center for it to be taken off.   If the pump alarms go off prior to the pump reading "Low Volume" then call [phone] and someone can assist you.  If the plunger comes out and the chemotherapy medication is  leaking out, please use your home chemo spill kit to clean up the spill. Do NOT use paper towels or other household products.  If you have problems or questions regarding your pump, please call either [phone] (24 hours a day) or the cancer center Monday-Friday 8:00 a.m.- 4:30 p.m. at the clinic number and we will assist you. If you are unable to get assistance, then go to the nearest Emergency Department and ask the staff to contact the IV team for assistance.  11:45

## 2021-11-25 NOTE — Progress Notes (Signed)
Patient presents today for treatment and follow up visit with Dr. Delton Coombes. Labs pending. Vital signs within parameters for today's treatment.   Message received from A. Ouida Sills RN / Dr. Delton Coombes to proceed with treatment. Labs reviewed. Order received to give 40 mEq PO Potassium x 1 dose.   Treatment given today per MD orders. Tolerated infusion without adverse affects. Vital signs stable. No complaints at this time. 5FU pump infusing. RUN noted on screen. Discharged from clinic ambulatory in stable condition. Alert and oriented x 3. F/U with [facility] as scheduled.

## 2021-11-25 NOTE — Progress Notes (Signed)
Susan Martin, [place] [address]   CLINIC:  Medical Oncology/Hematology  PCP:  Patient, No Pcp Per (Inactive) None None   REASON FOR VISIT:  Follow-up for metastatic colon cancer to liver  PRIOR THERAPY: Sigmoid colectomy and end colostomy on 04/29/2020  NGS Results: Foundation 1 KRAS wildtype, MS--stable, TMB 4 Muts/Mb  CURRENT THERAPY: FOLFOX, Avastin & Aloxi every 2 weeks  BRIEF ONCOLOGIC HISTORY:  Oncology History  Metastatic colon cancer to liver ([place])  05/16/2020 Initial Diagnosis   Metastatic colon cancer to liver ([place])    06/03/2020 Genetic Testing   Foundation One:     06/04/2020 -  Chemotherapy   Patient is on Treatment Plan : COLORECTAL FOLFOX + Bevacizumab q14d        CANCER STAGING:  Cancer Staging  No matching staging information was found for the patient.  INTERVAL HISTORY:  [name], a 61 y.o. female, returns for routine follow-up and consideration for next cycle of chemotherapy. Susan Martin was last seen on 10/28/2021.  Due for cycle #39 of FOLFOX, Avastin & Aloxi today.   Overall, she tells me she has been feeling pretty well. She reports her anxiety has improved with klonopin. She denies diarrhea, constipation, hematochezia, and nosebleeds. Her mouth sores have improved although she is still unable to eat spicy foods. Her energy is good. She denies pain and skin peeling in her hands and feet. She continues to take potassium.   Overall, she feels ready for next cycle of chemo today.    REVIEW OF SYSTEMS:  Review of Systems  Constitutional:  Negative for appetite change and fatigue.  HENT:   Positive for mouth sores (improved). Negative for nosebleeds.   Gastrointestinal:  Negative for blood in stool, constipation and diarrhea.  Neurological:  Positive for numbness.  Psychiatric/Behavioral:  The patient is not nervous/anxious (improved).   All other systems reviewed and are negative.  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 Delton Coombes   04-29-2020 [facility] surgery for perforated colon s/p sigmoid colectomy w/ colostomy; dx  Stage IV colon cancer mets to liver   Anxiety    Depression    Hypertension    followed by dr Glo Herring and oncology  (05-27-2020 per pt does not have pcp yet)   IDA (iron deficiency anemia)    Pulmonary nodule, right    Past Surgical History:  Procedure Laterality Date   ABDOMINAL HYSTERECTOMY  03-31-2016   @[facility]    W/  BILATERAL SALPINOOPHORECTOMY    APPLICATION OF WOUND VAC N/A 04/29/2020   Procedure: APPLICATION OF WOUND VAC;  Surgeon: Mickeal Skinner, MD;  Location: [facility];  Service: General;  Laterality: N/A;   ENDOMETRIAL ABLATION     LAPAROTOMY N/A 04/29/2020   Procedure: EXPLORATORY LAPAROTOMY;  Surgeon: Mickeal Skinner, MD;  Location: [facility];  Service: General;  Laterality: N/A;   PARTIAL COLECTOMY N/A 04/29/2020   Procedure: PARTIAL COLECTOMY WITH END COLOSTOMY;  Surgeon: Kieth Brightly Arta Bruce, MD;  Location: [facility];  Service: General;  Laterality: N/A;   PORTACATH PLACEMENT Right 05/28/2020   Procedure: INSERTION PORT-A-CATH;  Surgeon: Kinsinger, Arta Bruce, MD;  Location: [facility];  Service: General;  Laterality: Right;   SALPINGOOPHORECTOMY Left 03/31/2016   Procedure: LEFT SALPINGO OOPHORECTOMY WITH FROZEN SECTION,  ABDOMINAL HYSTERECTOMY WITH BILATERAL SALPINGO-OOPHORECTOMY;  Surgeon: Jonnie Kind, MD;  Location: [facility];  Service: Gynecology;  Laterality: Left;    SOCIAL HISTORY:  Social History   Socioeconomic History   Marital status: Divorced    Spouse name: Not on file   Number of children: Not on file   Years of education: Not on file   Highest education level: Not on file  Occupational History   Not on file  Tobacco Use   Smoking status: Former    Packs/day: 0.50    Years: 5.00    Pack years:  2.50    Types: Cigarettes    Quit date: 03/28/2015    Years since quitting: 6.6   Smokeless tobacco: Never  Vaping Use   Vaping Use: Never used  Substance and Sexual Activity   Alcohol use: No   Drug use: Never   Sexual activity: Yes    Partners: Male    Birth control/protection: Surgical  Other Topics Concern   Not on file  Social History Narrative   Not on file   Social Determinants of Health   Financial Resource Strain: Not on file  Food Insecurity: Not on file  Transportation Needs: Not on file  Physical Activity: Not on file  Stress: Not on file  Social Connections: Not on file  Intimate Partner Violence: Not on file    FAMILY HISTORY:  Family History  Problem Relation Age of Onset   Hypertension Mother    Diabetes Mother    Cirrhosis Father     CURRENT MEDICATIONS:  Current Outpatient Medications  Medication Sig Dispense Refill   amLODipine (NORVASC) 5 MG tablet Take 1 tablet (5 mg total) by mouth daily. 90 tablet 1   BEVACIZUMAB IV Inject 5 mg/kg into the vein every 14 (fourteen) days.     clonazePAM (KLONOPIN) 1 MG tablet TAKE 1 TABLET BY MOUTH 2 TIMES DAILY 60 tablet 3   fluorouracil CALGB 10258 in sodium chloride 0.9 % 150 mL Inject 2,400 mg/m2 into the vein over 48 hr.     gabapentin (NEURONTIN) 100 MG capsule TAKE 1 CAPSULE (100 MG TOTAL) BY MOUTH THREE TIMES DAILY. 90 capsule 3   LEUCOVORIN CALCIUM IV Inject 400 mg/m2 into the vein every 21 ( twenty-one) days.     lidocaine (XYLOCAINE) 2 % solution Use as directed 15 mLs in the mouth or throat every 6 (six) hours as needed for mouth pain. Swish and spit/swallow every six hours as needed for mouth pain 480 mL 3   lidocaine-prilocaine (EMLA) cream Apply small amount to port a cath site and cover with plastic wrap 1 hour prior to chemotherapy appointments 30 g 3   LORazepam (ATIVAN) 1 MG tablet Take 1 tablet (1 mg total) by mouth every 6 (six) hours as needed. for anxiety 120 tablet 1   nystatin (MYCOSTATIN)  100000 UNIT/ML suspension Take by mouth.     PARoxetine (PAXIL) 40 MG tablet Take 1 tablet (40 mg total) by mouth every morning. 90 tablet 3   polyethylene glycol (MIRALAX / GLYCOLAX) packet Take 17 g by mouth every other day.     potassium chloride (KLOR-CON M) 10 MEQ tablet Take 2 tablets (20 mEq total) by mouth daily. 60 tablet 6   prochlorperazine (COMPAZINE) 10 MG tablet Take 1 tablet (10 mg total) by mouth every 6 (six) hours as needed (Nausea or vomiting). 30 tablet 1   triamterene-hydrochlorothiazide (MAXZIDE-25) 37.5-25 MG tablet TAKE 1 TABLET BY MOUTH EVERY DAY 90 tablet 1   No current facility-administered medications for this visit.    ALLERGIES:  No Known Allergies  PHYSICAL EXAM:  Performance status (ECOG): 1 - Symptomatic but completely ambulatory  Vitals:   11/25/21 0815  BP: (!) 143/73  Pulse: 72  Resp: 18  Temp: (!) 97.2 F (36.2 C)  SpO2: 96%   Wt Readings from Last 3 Encounters:  11/25/21 172 lb 12.8 oz (78.4 kg)  11/11/21 171 lb (77.6 kg)  10/28/21 167 lb 12.8 oz (76.1 kg)   Physical Exam Vitals reviewed.  Constitutional:      Appearance: Normal appearance.  Cardiovascular:     Rate and Rhythm: Normal rate and regular rhythm.     Pulses: Normal pulses.     Heart sounds: Normal heart sounds.  Pulmonary:     Effort: Pulmonary effort is normal.     Breath sounds: Normal breath sounds.  Musculoskeletal:     Right lower leg: No edema.     Left lower leg: No edema.  Neurological:     General: No focal deficit present.     Mental Status: She is alert and oriented to person, place, and time.  Psychiatric:        Mood and Affect: Mood normal.        Behavior: Behavior normal.    LABORATORY DATA:  I have reviewed the labs as listed.     Latest Ref Rng & Units 11/11/2021    8:21 AM 10/28/2021    9:09 AM 10/14/2021    9:37 AM  CBC  WBC 4.0 - 10.5 K/uL 6.2   5.3   4.4    Hemoglobin 12.0 - 15.0 g/dL 13.9   14.0   14.2    Hematocrit 36.0 - 46.0 % 41.1    40.9   41.8    Platelets 150 - 400 K/uL 128   121   137        Latest Ref Rng & Units 11/11/2021    8:21 AM 10/28/2021    9:09 AM 10/14/2021    9:37 AM  CMP  Glucose 70 - 99 mg/dL 115   138   132    BUN 8 - 23 mg/dL 13   13   13     Creatinine 0.44 - 1.00 mg/dL 0.92   0.91   0.91    Sodium 135 - 145 mmol/L 134   136   136    Potassium 3.5 - 5.1 mmol/L 3.5   3.4   3.2    Chloride 98 - 111 mmol/L 101   102   101    CO2 22 - 32 mmol/L 26   26   25     Calcium 8.9 - 10.3 mg/dL 9.1   9.2   9.7    Total Protein 6.5 - 8.1 g/dL 7.7   7.7   7.9    Total Bilirubin 0.3 - 1.2 mg/dL 0.5   1.1   1.0    Alkaline Phos 38 - 126 U/L 84   80   81    AST 15 - 41 U/L 30   35   50    ALT 0 - 44 U/L 26   33   48      DIAGNOSTIC IMAGING:  I have independently reviewed the scans and discussed with the patient. No results found.   ASSESSMENT:  1.  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 -CT chest on  05/02/2020 with no evidence of pulmonary metastatic disease. -CTAP on 05/07/2020 with multiple lesions throughout the liver, largest in the right lobe measuring 3.9 cm, lateral segment left lobe measuring 2.6 cm and inferior right lobe confluent lesion 4.2 x 3.7 cm.  No lymphadenopathy. -CEA on 05/02/2020-60.2. -FOLFOX and bevacizumab started on 06/04/2020. 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   2.  Iron deficiency anemia: -Feraheme on 04/30/2020 and 05/06/2020.   3.  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 -Smoked for 3 to 4 years and quit. -Mother had cancer, type unknown.  Maternal uncle had lung cancer.  Maternal aunt had lung cancer.   PLAN:  1.  Metastatic colon adenocarcinoma to  liver, MS-stable: - CT CAP (10/21/2021): Stable liver lesions, stable bilateral lung nodules with no new evidence of metastatic disease. - She is tolerating maintenance 5-FU and bevacizumab very well. - Reviewed labs today which showed normal LFTs and CBC.  Mild thrombocytopenia stable.  Last CEA was 1.8.  UA was negative for proteinuria. - Proceed with treatment today and every 2 weeks.  RTC 6 weeks for follow-up with repeat CEA, CBC CMP and mag.   2.  Hypertension: -Continue triamterene/HCTZ and Norvasc 5 mg daily.  Blood pressure today is well controlled.   3.  Severe hypokalemia: -Continue potassium 20 mg daily.  Potassium today is 3.3.   4.  Peripheral neuropathy: -Continue gabapentin 100 mg in the mornings.   5.  Anxiety: -Continue Paxil 40 mg daily. - Continue Klonopin 1 mg twice daily which is helping.  6.  Mucositis: -Continue Vaseline and lidocaine gel as needed.   Orders placed this encounter:  No orders of the defined types were placed in this encounter.    Derek Jack, MD [place] [phone]   I, Thana Ates, am acting as a scribe for Dr. Derek Jack.  I, Derek Jack MD, have reviewed the above documentation for accuracy and completeness, and I agree with the above.

## 2021-11-27 ENCOUNTER — Inpatient Hospital Stay (HOSPITAL_COMMUNITY): Payer: Medicaid Other

## 2021-11-28 ENCOUNTER — Inpatient Hospital Stay (HOSPITAL_COMMUNITY): Payer: Medicaid Other | Attending: Hematology

## 2021-11-28 VITALS — BP 134/74 | HR 66 | Temp 98.6°F | Resp 16

## 2021-11-28 DIAGNOSIS — Z5189 Encounter for other specified aftercare: Secondary | ICD-10-CM | POA: Diagnosis not present

## 2021-11-28 DIAGNOSIS — Z5111 Encounter for antineoplastic chemotherapy: Secondary | ICD-10-CM | POA: Insufficient documentation

## 2021-11-28 DIAGNOSIS — C787 Secondary malignant neoplasm of liver and intrahepatic bile duct: Secondary | ICD-10-CM | POA: Diagnosis not present

## 2021-11-28 DIAGNOSIS — C189 Malignant neoplasm of colon, unspecified: Secondary | ICD-10-CM | POA: Insufficient documentation

## 2021-11-28 MED ORDER — SODIUM CHLORIDE 0.9% FLUSH
10.0000 mL | INTRAVENOUS | Status: DC | PRN
  Administered 2021-11-28: 10 mL

## 2021-11-28 MED ORDER — HEPARIN SOD (PORK) LOCK FLUSH 100 UNIT/ML IV SOLN
500.0000 [IU] | Freq: Once | INTRAVENOUS | Status: AC | PRN
  Administered 2021-11-28: 500 [IU]

## 2021-11-28 NOTE — Patient Instructions (Signed)
[facility]  Discharge Instructions: Thank you for choosing [facility] to provide your oncology and hematology care.  If you have a lab appointment with the Cancer Center, please come in thru the Main Entrance and check in at the main information desk.  Wear comfortable clothing and clothing appropriate for easy access to any Portacath or PICC line.   We strive to give you quality time with your provider. You may need to reschedule your appointment if you arrive late (15 or more minutes).  Arriving late affects you and other patients whose appointments are after yours.  Also, if you miss three or more appointments without notifying the office, you may be dismissed from the clinic at the provider's discretion.      For prescription refill requests, have your pharmacy contact our office and allow 72 hours for refills to be completed.        To help prevent nausea and vomiting after your treatment, we encourage you to take your nausea medication as directed.  BELOW ARE SYMPTOMS THAT SHOULD BE REPORTED IMMEDIATELY: *FEVER GREATER THAN 100.4 F (38 C) OR HIGHER *CHILLS OR SWEATING *NAUSEA AND VOMITING THAT IS NOT CONTROLLED WITH YOUR NAUSEA MEDICATION *UNUSUAL SHORTNESS OF BREATH *UNUSUAL BRUISING OR BLEEDING *URINARY PROBLEMS (pain or burning when urinating, or frequent urination) *BOWEL PROBLEMS (unusual diarrhea, constipation, pain near the anus) TENDERNESS IN MOUTH AND THROAT WITH OR WITHOUT PRESENCE OF ULCERS (sore throat, sores in mouth, or a toothache) UNUSUAL RASH, SWELLING OR PAIN  UNUSUAL VAGINAL DISCHARGE OR ITCHING   Items with * indicate a potential emergency and should be followed up as soon as possible or go to the Emergency Department if any problems should occur.  Please show the CHEMOTHERAPY ALERT CARD or IMMUNOTHERAPY ALERT CARD at check-in to the Emergency Department and triage nurse.  Should you have questions after your visit or need to cancel  or reschedule your appointment, please contact [facility] [phone]  and follow the prompts.  Office hours are 8:00 a.m. to 4:30 p.m. Monday - Friday. Please note that voicemails left after 4:00 p.m. may not be returned until the following business day.  We are closed weekends and major holidays. You have access to a nurse at all times for urgent questions. Please call the main number to the clinic [phone] and follow the prompts.  For any non-urgent questions, you may also contact your provider using MyChart. We now offer e-Visits for anyone 18 and older to request care online for non-urgent symptoms. For details visit [email or web address].   Also download the MyChart app! Go to the app store, search "MyChart", open the app, select [facility], and log in with your MyChart username and password.  Due to Covid, a mask is required upon entering the hospital/clinic. If you do not have a mask, one will be given to you upon arrival. For doctor visits, patients may have 1 support person aged 18 or older with them. For treatment visits, patients cannot have anyone with them due to current Covid guidelines and our immunocompromised population.  

## 2021-11-28 NOTE — Progress Notes (Signed)
Susan Martin presents to have home infusion pump d/c'd and for port-a-cath deaccess with flush.  Portacath located right chest wall accessed with  H 20 needle.  Good blood return present. Portacath flushed with NS and 500U/47m Heparin, and needle removed intact.  Procedure tolerated well and without incident.

## 2021-12-09 ENCOUNTER — Inpatient Hospital Stay (HOSPITAL_COMMUNITY): Payer: Medicaid Other

## 2021-12-09 ENCOUNTER — Encounter (HOSPITAL_COMMUNITY): Payer: Self-pay

## 2021-12-09 VITALS — BP 137/80 | HR 84 | Temp 97.1°F | Resp 18

## 2021-12-09 DIAGNOSIS — C189 Malignant neoplasm of colon, unspecified: Secondary | ICD-10-CM

## 2021-12-09 DIAGNOSIS — Z5111 Encounter for antineoplastic chemotherapy: Secondary | ICD-10-CM | POA: Diagnosis not present

## 2021-12-09 LAB — COMPREHENSIVE METABOLIC PANEL
ALT: 30 U/L (ref 0–44)
AST: 32 U/L (ref 15–41)
Albumin: 3.5 g/dL (ref 3.5–5.0)
Alkaline Phosphatase: 78 U/L (ref 38–126)
Anion gap: 7 (ref 5–15)
BUN: 10 mg/dL (ref 8–23)
CO2: 29 mmol/L (ref 22–32)
Calcium: 8.8 mg/dL — ABNORMAL LOW (ref 8.9–10.3)
Chloride: 101 mmol/L (ref 98–111)
Creatinine, Ser: 0.93 mg/dL (ref 0.44–1.00)
GFR, Estimated: >60 mL/min (ref >60)
Glucose, Bld: 103 mg/dL — ABNORMAL HIGH (ref 70–99)
Potassium: 3.4 mmol/L — ABNORMAL LOW (ref 3.5–5.1)
Sodium: 137 mmol/L (ref 135–145)
Total Bilirubin: 0.6 mg/dL (ref 0.3–1.2)
Total Protein: 7 g/dL (ref 6.5–8.1)

## 2021-12-09 LAB — CBC WITH DIFFERENTIAL/PLATELET
Abs Immature Granulocytes: 0.01 10*3/uL (ref 0.00–0.07)
Basophils Absolute: 0 10*3/uL (ref 0.0–0.1)
Basophils Relative: 0 %
Eosinophils Absolute: 0.2 10*3/uL (ref 0.0–0.5)
Eosinophils Relative: 3 %
HCT: 40.1 % (ref 36.0–46.0)
Hemoglobin: 13.4 g/dL (ref 12.0–15.0)
Immature Granulocytes: 0 %
Lymphocytes Relative: 31 %
Lymphs Abs: 1.6 10*3/uL (ref 0.7–4.0)
MCH: 31.5 pg (ref 26.0–34.0)
MCHC: 33.4 g/dL (ref 30.0–36.0)
MCV: 94.4 fL (ref 80.0–100.0)
Monocytes Absolute: 0.7 10*3/uL (ref 0.1–1.0)
Monocytes Relative: 13 %
Neutro Abs: 2.8 10*3/uL (ref 1.7–7.7)
Neutrophils Relative %: 53 %
Platelets: 133 10*3/uL — ABNORMAL LOW (ref 150–400)
RBC: 4.25 MIL/uL (ref 3.87–5.11)
RDW: 15.8 % — ABNORMAL HIGH (ref 11.5–15.5)
WBC: 5.3 10*3/uL (ref 4.0–10.5)
nRBC: 0 % (ref 0.0–0.2)

## 2021-12-09 LAB — URINALYSIS, DIPSTICK ONLY
Bilirubin Urine: NEGATIVE
Glucose, UA: NEGATIVE mg/dL
Ketones, ur: NEGATIVE mg/dL
Leukocytes,Ua: NEGATIVE
Nitrite: NEGATIVE
Protein, ur: NEGATIVE mg/dL
Specific Gravity, Urine: 1.012 (ref 1.005–1.030)
pH: 6 (ref 5.0–8.0)

## 2021-12-09 LAB — MAGNESIUM: Magnesium: 2.1 mg/dL (ref 1.7–2.4)

## 2021-12-09 MED ORDER — ALTEPLASE 2 MG IJ SOLR
2.0000 mg | Freq: Once | INTRAMUSCULAR | Status: AC
  Administered 2021-12-09: 2 mg
  Filled 2021-12-09: qty 2

## 2021-12-09 MED ORDER — SODIUM CHLORIDE 0.9 % IV SOLN
10.0000 mg | Freq: Once | INTRAVENOUS | Status: AC
  Administered 2021-12-09: 10 mg via INTRAVENOUS
  Filled 2021-12-09: qty 1

## 2021-12-09 MED ORDER — SODIUM CHLORIDE 0.9 % IV SOLN
1920.0000 mg/m2 | INTRAVENOUS | Status: DC
  Administered 2021-12-09: 3250 mg via INTRAVENOUS
  Filled 2021-12-09: qty 65

## 2021-12-09 MED ORDER — SODIUM CHLORIDE 0.9 % IV SOLN
400.0000 mg/m2 | Freq: Once | INTRAVENOUS | Status: AC
  Administered 2021-12-09: 680 mg via INTRAVENOUS
  Filled 2021-12-09: qty 34

## 2021-12-09 MED ORDER — SODIUM CHLORIDE 0.9% FLUSH: 10.0000 mL | INTRAVENOUS | Status: DC | PRN

## 2021-12-09 MED ORDER — SODIUM CHLORIDE 0.9 % IV SOLN
5.0000 mg/kg | Freq: Once | INTRAVENOUS | Status: AC
  Administered 2021-12-09: 400 mg via INTRAVENOUS
  Filled 2021-12-09: qty 16

## 2021-12-09 MED ORDER — SODIUM CHLORIDE 0.9 % IV SOLN: Freq: Once | INTRAVENOUS | Status: AC

## 2021-12-09 MED ORDER — FLUOROURACIL CHEMO INJECTION 2.5 GM/50ML
320.0000 mg/m2 | Freq: Once | INTRAVENOUS | Status: AC
  Administered 2021-12-09: 550 mg via INTRAVENOUS
  Filled 2021-12-09: qty 11

## 2021-12-09 MED ORDER — PALONOSETRON HCL INJECTION 0.25 MG/5ML
0.2500 mg | Freq: Once | INTRAVENOUS | Status: AC
  Administered 2021-12-09: 0.25 mg via INTRAVENOUS
  Filled 2021-12-09: qty 5

## 2021-12-09 NOTE — Progress Notes (Signed)
Patient tolerated chemotherapy with no complaints voiced.  Side effects with management reviewed with understanding verbalized.  Port site clean and dry with no bruising or swelling noted at site.  Good blood return noted before and after administration of chemotherapy.  Chemotherapy pump started with no alarms noted.  Dressing intact.  Patient left in satisfactory condition with VSS and no s/s of distress noted.

## 2021-12-09 NOTE — Patient Instructions (Signed)
[facility]  Discharge Instructions: Thank you for choosing [facility] to provide your oncology and hematology care.  If you have a lab appointment with the [facility], please come in thru the Main Entrance and check in at the main information desk.  Wear comfortable clothing and clothing appropriate for easy access to any Portacath or PICC line.   We strive to give you quality time with your provider. You may need to reschedule your appointment if you arrive late (15 or more minutes).  Arriving late affects you and other patients whose appointments are after yours.  Also, if you miss three or more appointments without notifying the office, you may be dismissed from the clinic at the provider's discretion.      For prescription refill requests, have your pharmacy contact our office and allow 72 hours for refills to be completed.    Today you received the following chemotherapy and/or immunotherapy agents MVASI, leucovorin, adruicil.   Fluorouracil, 5-FU injection What is this medication? FLUOROURACIL, 5-FU (flure oh YOOR a sil) is a chemotherapy drug. It slows the growth of cancer cells. This medicine is used to treat many types of cancer like breast cancer, colon or rectal cancer, pancreatic cancer, and stomach cancer. This medicine may be used for other purposes; ask your health care provider or pharmacist if you have questions. COMMON BRAND NAME(S): Adrucil What should I tell my care team before I take this medication? They need to know if you have any of these conditions: blood disorders dihydropyrimidine dehydrogenase (DPD) deficiency infection (especially a virus infection such as chickenpox, cold sores, or herpes) kidney disease liver disease malnourished, poor nutrition recent or ongoing radiation therapy an unusual or allergic reaction to fluorouracil, other chemotherapy, other medicines, foods, dyes, or preservatives pregnant or trying to get  pregnant breast-feeding How should I use this medication? This drug is given as an infusion or injection into a vein. It is administered in a hospital or clinic by a specially trained health care professional. Talk to your pediatrician regarding the use of this medicine in children. Special care may be needed. Overdosage: If you think you have taken too much of this medicine contact a poison control center or emergency room at once. NOTE: This medicine is only for you. Do not share this medicine with others. What if I miss a dose? It is important not to miss your dose. Call your doctor or health care professional if you are unable to keep an appointment. What may interact with this medication? Do not take this medicine with any of the following medications: live virus vaccines This medicine may also interact with the following medications: medicines that treat or prevent blood clots like warfarin, enoxaparin, and dalteparin This list may not describe all possible interactions. Give your health care provider a list of all the medicines, herbs, non-prescription drugs, or dietary supplements you use. Also tell them if you smoke, drink alcohol, or use illegal drugs. Some items may interact with your medicine. What should I watch for while using this medication? Visit your doctor for checks on your progress. This drug may make you feel generally unwell. This is not uncommon, as chemotherapy can affect healthy cells as well as cancer cells. Report any side effects. Continue your course of treatment even though you feel ill unless your doctor tells you to stop. In some cases, you may be given additional medicines to help with side effects. Follow all directions for their use. Call your  doctor or health care professional for advice if you get a fever, chills or sore throat, or other symptoms of a cold or flu. Do not treat yourself. This drug decreases your body's ability to fight infections. Try to avoid  being around people who are sick. This medicine may increase your risk to bruise or bleed. Call your doctor or health care professional if you notice any unusual bleeding. Be careful brushing and flossing your teeth or using a toothpick because you may get an infection or bleed more easily. If you have any dental work done, tell your dentist you are receiving this medicine. Avoid taking products that contain aspirin, acetaminophen, ibuprofen, naproxen, or ketoprofen unless instructed by your doctor. These medicines may hide a fever. Do not become pregnant while taking this medicine. Women should inform their doctor if they wish to become pregnant or think they might be pregnant. There is a potential for serious side effects to an unborn child. Talk to your health care professional or pharmacist for more information. Do not breast-feed an infant while taking this medicine. Men should inform their doctor if they wish to father a child. This medicine may lower sperm counts. Do not treat diarrhea with over the counter products. Contact your doctor if you have diarrhea that lasts more than 2 days or if it is severe and watery. This medicine can make you more sensitive to the sun. Keep out of the sun. If you cannot avoid being in the sun, wear protective clothing and use sunscreen. Do not use sun lamps or tanning beds/booths. What side effects may I notice from receiving this medication? Side effects that you should report to your doctor or health care professional as soon as possible: allergic reactions like skin rash, itching or hives, swelling of the face, lips, or tongue low blood counts - this medicine may decrease the number of white blood cells, red blood cells and platelets. You may be at increased risk for infections and bleeding. signs of infection - fever or chills, cough, sore throat, pain or difficulty passing urine signs of decreased platelets or bleeding - bruising, pinpoint red spots on the  skin, black, tarry stools, blood in the urine signs of decreased red blood cells - unusually weak or tired, fainting spells, lightheadedness breathing problems changes in vision chest pain mouth sores nausea and vomiting pain, swelling, redness at site where injected pain, tingling, numbness in the hands or feet redness, swelling, or sores on hands or feet stomach pain unusual bleeding Side effects that usually do not require medical attention (report to your doctor or health care professional if they continue or are bothersome): changes in finger or toe nails diarrhea dry or itchy skin hair loss headache loss of appetite sensitivity of eyes to the light stomach upset unusually teary eyes This list may not describe all possible side effects. Call your doctor for medical advice about side effects. You may report side effects to FDA at 1-800-FDA-1088. Where should I keep my medication? This drug is given in a hospital or clinic and will not be stored at home. NOTE: This sheet is a summary. It may not cover all possible information. If you have questions about this medicine, talk to your doctor, pharmacist, or health care provider.  2023 Elsevier/Gold Standard (2021-05-16 00:00:00) Leucovorin injection What is this medication? LEUCOVORIN (loo koe VOR in) is used to prevent or treat the harmful effects of some medicines. This medicine is used to treat anemia caused by a low amount  of folic acid in the body. It is also used with 5-fluorouracil (5-FU) to treat colon cancer. This medicine may be used for other purposes; ask your health care provider or pharmacist if you have questions. What should I tell my care team before I take this medication? They need to know if you have any of these conditions: anemia from low levels of vitamin B-12 in the blood an unusual or allergic reaction to leucovorin, folic acid, other medicines, foods, dyes, or preservatives pregnant or trying to get  pregnant breast-feeding How should I use this medication? This medicine is for injection into a muscle or into a vein. It is given by a health care professional in a hospital or clinic setting. Talk to your pediatrician regarding the use of this medicine in children. Special care may be needed. Overdosage: If you think you have taken too much of this medicine contact a poison control center or emergency room at once. NOTE: This medicine is only for you. Do not share this medicine with others. What if I miss a dose? This does not apply. What may interact with this medication? capecitabine fluorouracil phenobarbital phenytoin primidone trimethoprim-sulfamethoxazole This list may not describe all possible interactions. Give your health care provider a list of all the medicines, herbs, non-prescription drugs, or dietary supplements you use. Also tell them if you smoke, drink alcohol, or use illegal drugs. Some items may interact with your medicine. What should I watch for while using this medication? Your condition will be monitored carefully while you are receiving this medicine. This medicine may increase the side effects of 5-fluorouracil, 5-FU. Tell your doctor or health care professional if you have diarrhea or mouth sores that do not get better or that get worse. What side effects may I notice from receiving this medication? Side effects that you should report to your doctor or health care professional as soon as possible: allergic reactions like skin rash, itching or hives, swelling of the face, lips, or tongue breathing problems fever, infection mouth sores unusual bleeding or bruising unusually weak or tired Side effects that usually do not require medical attention (report to your doctor or health care professional if they continue or are bothersome): constipation or diarrhea loss of appetite nausea, vomiting This list may not describe all possible side effects. Call your doctor  for medical advice about side effects. You may report side effects to FDA at 1-800-FDA-1088. Where should I keep my medication? This drug is given in a hospital or clinic and will not be stored at home. NOTE: This sheet is a summary. It may not cover all possible information. If you have questions about this medicine, talk to your doctor, pharmacist, or health care provider.  2023 Elsevier/Gold Standard (2007-12-22 00:00:00) Bevacizumab injection What is this medication? BEVACIZUMAB (be va SIZ yoo mab) is a monoclonal antibody. It is used to treat many types of cancer. This medicine may be used for other purposes; ask your health care provider or pharmacist if you have questions. COMMON BRAND NAME(S): Alymsys, Avastin, MVASI, Noah Charon What should I tell my care team before I take this medication? They need to know if you have any of these conditions: diabetes heart disease high blood pressure history of coughing up blood prior anthracycline chemotherapy (e.g., doxorubicin, daunorubicin, epirubicin) recent or ongoing radiation therapy recent or planning to have surgery stroke an unusual or allergic reaction to bevacizumab, hamster proteins, mouse proteins, other medicines, foods, dyes, or preservatives pregnant or trying to get pregnant breast-feeding How  should I use this medication? This medicine is for infusion into a vein. It is given by a health care professional in a hospital or clinic setting. Talk to your pediatrician regarding the use of this medicine in children. Special care may be needed. Overdosage: If you think you have taken too much of this medicine contact a poison control center or emergency room at once. NOTE: This medicine is only for you. Do not share this medicine with others. What if I miss a dose? It is important not to miss your dose. Call your doctor or health care professional if you are unable to keep an appointment. What may interact with this  medication? Interactions are not expected. This list may not describe all possible interactions. Give your health care provider a list of all the medicines, herbs, non-prescription drugs, or dietary supplements you use. Also tell them if you smoke, drink alcohol, or use illegal drugs. Some items may interact with your medicine. What should I watch for while using this medication? Your condition will be monitored carefully while you are receiving this medicine. You will need important blood work and urine testing done while you are taking this medicine. This medicine may increase your risk to bruise or bleed. Call your doctor or health care professional if you notice any unusual bleeding. Before having surgery, talk to your health care provider to make sure it is ok. This drug can increase the risk of poor healing of your surgical site or wound. You will need to stop this drug for 28 days before surgery. After surgery, wait at least 28 days before restarting this drug. Make sure the surgical site or wound is healed enough before restarting this drug. Talk to your health care provider if questions. Do not become pregnant while taking this medicine or for 6 months after stopping it. Women should inform their doctor if they wish to become pregnant or think they might be pregnant. There is a potential for serious side effects to an unborn child. Talk to your health care professional or pharmacist for more information. Do not breast-feed an infant while taking this medicine and for 6 months after the last dose. This medicine has caused ovarian failure in some women. This medicine may interfere with the ability to have a child. You should talk to your doctor or health care professional if you are concerned about your fertility. What side effects may I notice from receiving this medication? Side effects that you should report to your doctor or health care professional as soon as possible: allergic reactions like  skin rash, itching or hives, swelling of the face, lips, or tongue chest pain or chest tightness chills coughing up blood high fever seizures severe constipation signs and symptoms of bleeding such as bloody or black, tarry stools; red or dark-brown urine; spitting up blood or brown material that looks like coffee grounds; red spots on the skin; unusual bruising or bleeding from the eye, gums, or nose signs and symptoms of a blood clot such as breathing problems; chest pain; severe, sudden headache; pain, swelling, warmth in the leg signs and symptoms of a stroke like changes in vision; confusion; trouble speaking or understanding; severe headaches; sudden numbness or weakness of the face, arm or leg; trouble walking; dizziness; loss of balance or coordination stomach pain sweating swelling of legs or ankles vomiting weight gain Side effects that usually do not require medical attention (report to your doctor or health care professional if they continue or are bothersome):  back pain changes in taste decreased appetite dry skin nausea tiredness This list may not describe all possible side effects. Call your doctor for medical advice about side effects. You may report side effects to FDA at 1-800-FDA-1088. Where should I keep my medication? This drug is given in a hospital or clinic and will not be stored at home. NOTE: This sheet is a summary. It may not cover all possible information. If you have questions about this medicine, talk to your doctor, pharmacist, or health care provider.  2023 Elsevier/Gold Standard (2021-05-16 00:00:00)       To help prevent nausea and vomiting after your treatment, we encourage you to take your nausea medication as directed.  BELOW ARE SYMPTOMS THAT SHOULD BE REPORTED IMMEDIATELY: *FEVER GREATER THAN 100.4 F (38 C) OR HIGHER *CHILLS OR SWEATING *NAUSEA AND VOMITING THAT IS NOT CONTROLLED WITH YOUR NAUSEA MEDICATION *UNUSUAL SHORTNESS OF  BREATH *UNUSUAL BRUISING OR BLEEDING *URINARY PROBLEMS (pain or burning when urinating, or frequent urination) *BOWEL PROBLEMS (unusual diarrhea, constipation, pain near the anus) TENDERNESS IN MOUTH AND THROAT WITH OR WITHOUT PRESENCE OF ULCERS (sore throat, sores in mouth, or a toothache) UNUSUAL RASH, SWELLING OR PAIN  UNUSUAL VAGINAL DISCHARGE OR ITCHING   Items with * indicate a potential emergency and should be followed up as soon as possible or go to the Emergency Department if any problems should occur.  Please show the CHEMOTHERAPY ALERT CARD or IMMUNOTHERAPY ALERT CARD at check-in to the Emergency Department and triage nurse.  Should you have questions after your visit or need to cancel or reschedule your appointment, please contact [facility] [phone]  and follow the prompts.  Office hours are 8:00 a.m. to 4:30 p.m. Monday - Friday. Please note that voicemails left after 4:00 p.m. may not be returned until the following business day.  We are closed weekends and major holidays. You have access to a nurse at all times for urgent questions. Please call the main number to the clinic [phone] and follow the prompts.  For any non-urgent questions, you may also contact your provider using MyChart. We now offer e-Visits for anyone 41 and older to request care online for non-urgent symptoms. For details visit [email or web address].   Also download the MyChart app! Go to the app store, search "MyChart", open the app, select [facility], and log in with your MyChart username and password.  Masks are optional in the cancer centers. If you would like for your care team to wear a mask while they are taking care of you, please let them know. For doctor visits, patients may have with them one support person who is at least 62 years old. At this time, visitors are not allowed in the infusion area.

## 2021-12-11 ENCOUNTER — Encounter (HOSPITAL_COMMUNITY): Payer: Self-pay

## 2021-12-11 ENCOUNTER — Inpatient Hospital Stay (HOSPITAL_COMMUNITY): Payer: Medicaid Other

## 2021-12-11 VITALS — BP 153/74 | HR 77 | Temp 98.3°F | Resp 18

## 2021-12-11 DIAGNOSIS — C189 Malignant neoplasm of colon, unspecified: Secondary | ICD-10-CM

## 2021-12-11 DIAGNOSIS — Z5111 Encounter for antineoplastic chemotherapy: Secondary | ICD-10-CM | POA: Diagnosis not present

## 2021-12-11 MED ORDER — HEPARIN SOD (PORK) LOCK FLUSH 100 UNIT/ML IV SOLN
500.0000 [IU] | Freq: Once | INTRAVENOUS | Status: AC | PRN
  Administered 2021-12-11: 500 [IU]

## 2021-12-11 MED ORDER — SODIUM CHLORIDE 0.9% FLUSH
10.0000 mL | INTRAVENOUS | Status: DC | PRN
  Administered 2021-12-11: 10 mL

## 2021-12-11 NOTE — Progress Notes (Signed)
Patient presents today for pump d/c.  Port flushed with good blood return noted. No bruising or swelling at site. Bandaid applied and patient discharged in satisfactory condition. VVS stable with no signs or symptoms of distressed noted.  

## 2021-12-11 NOTE — Patient Instructions (Signed)
[facility]  Discharge Instructions: Thank you for choosing [facility] to provide your oncology and hematology care.  If you have a lab appointment with the Cancer Center, please come in thru the Main Entrance and check in at the main information desk.  Wear comfortable clothing and clothing appropriate for easy access to any Portacath or PICC line.   We strive to give you quality time with your provider. You may need to reschedule your appointment if you arrive late (15 or more minutes).  Arriving late affects you and other patients whose appointments are after yours.  Also, if you miss three or more appointments without notifying the office, you may be dismissed from the clinic at the provider's discretion.      For prescription refill requests, have your pharmacy contact our office and allow 72 hours for refills to be completed.    Today you received the following pump d/c and port flush, return as scheduled.   To help prevent nausea and vomiting after your treatment, we encourage you to take your nausea medication as directed.  BELOW ARE SYMPTOMS THAT SHOULD BE REPORTED IMMEDIATELY: *FEVER GREATER THAN 100.4 F (38 C) OR HIGHER *CHILLS OR SWEATING *NAUSEA AND VOMITING THAT IS NOT CONTROLLED WITH YOUR NAUSEA MEDICATION *UNUSUAL SHORTNESS OF BREATH *UNUSUAL BRUISING OR BLEEDING *URINARY PROBLEMS (pain or burning when urinating, or frequent urination) *BOWEL PROBLEMS (unusual diarrhea, constipation, pain near the anus) TENDERNESS IN MOUTH AND THROAT WITH OR WITHOUT PRESENCE OF ULCERS (sore throat, sores in mouth, or a toothache) UNUSUAL RASH, SWELLING OR PAIN  UNUSUAL VAGINAL DISCHARGE OR ITCHING   Items with * indicate a potential emergency and should be followed up as soon as possible or go to the Emergency Department if any problems should occur.  Please show the CHEMOTHERAPY ALERT CARD or IMMUNOTHERAPY ALERT CARD at check-in to the Emergency Department and  triage nurse.  Should you have questions after your visit or need to cancel or reschedule your appointment, please contact [facility] [phone]  and follow the prompts.  Office hours are 8:00 a.m. to 4:30 p.m. Monday - Friday. Please note that voicemails left after 4:00 p.m. may not be returned until the following business day.  We are closed weekends and major holidays. You have access to a nurse at all times for urgent questions. Please call the main number to the clinic [phone] and follow the prompts.  For any non-urgent questions, you may also contact your provider using MyChart. We now offer e-Visits for anyone 18 and older to request care online for non-urgent symptoms. For details visit [email or web address].   Also download the MyChart app! Go to the app store, search "MyChart", open the app, select [facility], and log in with your MyChart username and password.  Masks are optional in the cancer centers. If you would like for your care team to wear a mask while they are taking care of you, please let them know. For doctor visits, patients may have with them one support person who is at least 62 years old. At this time, visitors are not allowed in the infusion area.  

## 2021-12-23 ENCOUNTER — Encounter (HOSPITAL_COMMUNITY): Payer: Self-pay

## 2021-12-23 ENCOUNTER — Inpatient Hospital Stay (HOSPITAL_COMMUNITY): Payer: Medicaid Other

## 2021-12-23 VITALS — BP 134/78 | HR 78 | Temp 96.9°F | Resp 18

## 2021-12-23 DIAGNOSIS — E876 Hypokalemia: Secondary | ICD-10-CM

## 2021-12-23 DIAGNOSIS — C189 Malignant neoplasm of colon, unspecified: Secondary | ICD-10-CM

## 2021-12-23 DIAGNOSIS — Z5111 Encounter for antineoplastic chemotherapy: Secondary | ICD-10-CM | POA: Diagnosis not present

## 2021-12-23 LAB — COMPREHENSIVE METABOLIC PANEL
ALT: 25 U/L (ref 0–44)
AST: 27 U/L (ref 15–41)
Albumin: 3.7 g/dL (ref 3.5–5.0)
Alkaline Phosphatase: 77 U/L (ref 38–126)
Anion gap: 10 (ref 5–15)
BUN: 13 mg/dL (ref 8–23)
CO2: 26 mmol/L (ref 22–32)
Calcium: 8.8 mg/dL — ABNORMAL LOW (ref 8.9–10.3)
Chloride: 99 mmol/L (ref 98–111)
Creatinine, Ser: 0.9 mg/dL (ref 0.44–1.00)
GFR, Estimated: >60 mL/min (ref >60)
Glucose, Bld: 136 mg/dL — ABNORMAL HIGH (ref 70–99)
Potassium: 3.1 mmol/L — ABNORMAL LOW (ref 3.5–5.1)
Sodium: 135 mmol/L (ref 135–145)
Total Bilirubin: 0.9 mg/dL (ref 0.3–1.2)
Total Protein: 7.5 g/dL (ref 6.5–8.1)

## 2021-12-23 LAB — CBC WITH DIFFERENTIAL/PLATELET
Abs Immature Granulocytes: 0 10*3/uL (ref 0.00–0.07)
Basophils Absolute: 0 10*3/uL (ref 0.0–0.1)
Basophils Relative: 1 %
Eosinophils Absolute: 0.2 10*3/uL (ref 0.0–0.5)
Eosinophils Relative: 4 %
HCT: 39.9 % (ref 36.0–46.0)
Hemoglobin: 13.5 g/dL (ref 12.0–15.0)
Immature Granulocytes: 0 %
Lymphocytes Relative: 31 %
Lymphs Abs: 1.5 10*3/uL (ref 0.7–4.0)
MCH: 31.8 pg (ref 26.0–34.0)
MCHC: 33.8 g/dL (ref 30.0–36.0)
MCV: 94.1 fL (ref 80.0–100.0)
Monocytes Absolute: 0.6 10*3/uL (ref 0.1–1.0)
Monocytes Relative: 12 %
Neutro Abs: 2.7 10*3/uL (ref 1.7–7.7)
Neutrophils Relative %: 52 %
Platelets: 143 10*3/uL — ABNORMAL LOW (ref 150–400)
RBC: 4.24 MIL/uL (ref 3.87–5.11)
RDW: 16.4 % — ABNORMAL HIGH (ref 11.5–15.5)
WBC: 5 10*3/uL (ref 4.0–10.5)
nRBC: 0 % (ref 0.0–0.2)

## 2021-12-23 LAB — MAGNESIUM: Magnesium: 2.1 mg/dL (ref 1.7–2.4)

## 2021-12-23 MED ORDER — FLUOROURACIL CHEMO INJECTION 2.5 GM/50ML
320.0000 mg/m2 | Freq: Once | INTRAVENOUS | Status: AC
  Administered 2021-12-23: 550 mg via INTRAVENOUS
  Filled 2021-12-23: qty 11

## 2021-12-23 MED ORDER — SODIUM CHLORIDE 0.9 % IV SOLN
10.0000 mg | Freq: Once | INTRAVENOUS | Status: AC
  Administered 2021-12-23: 10 mg via INTRAVENOUS
  Filled 2021-12-23: qty 10

## 2021-12-23 MED ORDER — PALONOSETRON HCL INJECTION 0.25 MG/5ML
0.2500 mg | Freq: Once | INTRAVENOUS | Status: AC
  Administered 2021-12-23: 0.25 mg via INTRAVENOUS
  Filled 2021-12-23: qty 5

## 2021-12-23 MED ORDER — SODIUM CHLORIDE 0.9% FLUSH
10.0000 mL | INTRAVENOUS | Status: DC | PRN
  Administered 2021-12-23: 10 mL

## 2021-12-23 MED ORDER — SODIUM CHLORIDE 0.9 % IV SOLN
1920.0000 mg/m2 | INTRAVENOUS | Status: DC
  Administered 2021-12-23: 3250 mg via INTRAVENOUS
  Filled 2021-12-23: qty 65

## 2021-12-23 MED ORDER — SODIUM CHLORIDE 0.9 % IV SOLN
400.0000 mg/m2 | Freq: Once | INTRAVENOUS | Status: AC
  Administered 2021-12-23: 680 mg via INTRAVENOUS
  Filled 2021-12-23: qty 34

## 2021-12-23 MED ORDER — SODIUM CHLORIDE 0.9 % IV SOLN: Freq: Once | INTRAVENOUS | Status: AC

## 2021-12-23 MED ORDER — POTASSIUM CHLORIDE CRYS ER 20 MEQ PO TBCR
40.0000 meq | EXTENDED_RELEASE_TABLET | Freq: Once | ORAL | Status: AC
  Administered 2021-12-23: 40 meq via ORAL
  Filled 2021-12-23: qty 2

## 2021-12-23 MED ORDER — SODIUM CHLORIDE 0.9 % IV SOLN
5.0000 mg/kg | Freq: Once | INTRAVENOUS | Status: AC
  Administered 2021-12-23: 400 mg via INTRAVENOUS
  Filled 2021-12-23: qty 16

## 2021-12-24 LAB — CEA: CEA: 1.7 ng/mL (ref 0.0–4.7)

## 2021-12-25 ENCOUNTER — Encounter (HOSPITAL_COMMUNITY): Payer: Self-pay

## 2021-12-25 ENCOUNTER — Inpatient Hospital Stay (HOSPITAL_COMMUNITY): Payer: Medicaid Other

## 2021-12-25 VITALS — BP 150/70 | HR 70 | Temp 97.5°F | Resp 18

## 2021-12-25 DIAGNOSIS — Z5111 Encounter for antineoplastic chemotherapy: Secondary | ICD-10-CM | POA: Diagnosis not present

## 2021-12-25 DIAGNOSIS — C189 Malignant neoplasm of colon, unspecified: Secondary | ICD-10-CM

## 2021-12-25 MED ORDER — HEPARIN SOD (PORK) LOCK FLUSH 100 UNIT/ML IV SOLN
500.0000 [IU] | Freq: Once | INTRAVENOUS | Status: AC | PRN
  Administered 2021-12-25: 500 [IU]

## 2021-12-25 MED ORDER — SODIUM CHLORIDE 0.9% FLUSH
10.0000 mL | INTRAVENOUS | Status: DC | PRN
  Administered 2021-12-25: 10 mL

## 2021-12-25 NOTE — Patient Instructions (Signed)
[facility]  Discharge Instructions: Thank you for choosing [facility] to provide your oncology and hematology care.  If you have a lab appointment with the [facility], please come in thru the Main Entrance and check in at the main information desk.  Wear comfortable clothing and clothing appropriate for easy access to any Portacath or PICC line.   We strive to give you quality time with your provider. You may need to reschedule your appointment if you arrive late (15 or more minutes).  Arriving late affects you and other patients whose appointments are after yours.  Also, if you miss three or more appointments without notifying the office, you may be dismissed from the clinic at the provider's discretion.      For prescription refill requests, have your pharmacy contact our office and allow 72 hours for refills to be completed.    Today your chemo pump was disconnected and port was flushed, return as scheduled.  To help prevent nausea and vomiting after your treatment, we encourage you to take your nausea medication as directed.  BELOW ARE SYMPTOMS THAT SHOULD BE REPORTED IMMEDIATELY: *FEVER GREATER THAN 100.4 F (38 C) OR HIGHER *CHILLS OR SWEATING *NAUSEA AND VOMITING THAT IS NOT CONTROLLED WITH YOUR NAUSEA MEDICATION *UNUSUAL SHORTNESS OF BREATH *UNUSUAL BRUISING OR BLEEDING *URINARY PROBLEMS (pain or burning when urinating, or frequent urination) *BOWEL PROBLEMS (unusual diarrhea, constipation, pain near the anus) TENDERNESS IN MOUTH AND THROAT WITH OR WITHOUT PRESENCE OF ULCERS (sore throat, sores in mouth, or a toothache) UNUSUAL RASH, SWELLING OR PAIN  UNUSUAL VAGINAL DISCHARGE OR ITCHING   Items with * indicate a potential emergency and should be followed up as soon as possible or go to the Emergency Department if any problems should occur.  Please show the CHEMOTHERAPY ALERT CARD or IMMUNOTHERAPY ALERT CARD at check-in to the Emergency Department  and triage nurse.  Should you have questions after your visit or need to cancel or reschedule your appointment, please contact [facility] [phone]  and follow the prompts.  Office hours are 8:00 a.m. to 4:30 p.m. Monday - Friday. Please note that voicemails left after 4:00 p.m. may not be returned until the following business day.  We are closed weekends and major holidays. You have access to a nurse at all times for urgent questions. Please call the main number to the clinic [phone] and follow the prompts.  For any non-urgent questions, you may also contact your provider using MyChart. We now offer e-Visits for anyone 62 and older to request care online for non-urgent symptoms. For details visit [email or web address].   Also download the MyChart app! Go to the app store, search "MyChart", open the app, select [facility], and log in with your MyChart username and password.  Masks are optional in the cancer centers. If you would like for your care team to wear a mask while they are taking care of you, please let them know. For doctor visits, patients may have with them one support person who is at least 62 years old. At this time, visitors are not allowed in the infusion area.

## 2021-12-25 NOTE — Progress Notes (Signed)
Chemo pump disconnected, Port flushed with good blood return noted. No bruising or swelling at site. Bandaid applied and patient discharged in satisfactory condition. VVS stable with no signs or symptoms of distressed noted.

## 2022-01-06 ENCOUNTER — Inpatient Hospital Stay (HOSPITAL_COMMUNITY): Payer: Medicaid Other

## 2022-01-06 ENCOUNTER — Inpatient Hospital Stay (HOSPITAL_COMMUNITY): Payer: Medicaid Other | Attending: Hematology | Admitting: Hematology

## 2022-01-06 VITALS — BP 147/82 | HR 79 | Temp 99.1°F | Resp 18 | Ht 65.0 in

## 2022-01-06 VITALS — BP 155/86 | HR 81 | Temp 98.6°F | Resp 18

## 2022-01-06 DIAGNOSIS — Z933 Colostomy status: Secondary | ICD-10-CM | POA: Diagnosis not present

## 2022-01-06 DIAGNOSIS — Z87891 Personal history of nicotine dependence: Secondary | ICD-10-CM | POA: Diagnosis not present

## 2022-01-06 DIAGNOSIS — Z801 Family history of malignant neoplasm of trachea, bronchus and lung: Secondary | ICD-10-CM | POA: Diagnosis not present

## 2022-01-06 DIAGNOSIS — K1230 Oral mucositis (ulcerative), unspecified: Secondary | ICD-10-CM | POA: Insufficient documentation

## 2022-01-06 DIAGNOSIS — G629 Polyneuropathy, unspecified: Secondary | ICD-10-CM | POA: Insufficient documentation

## 2022-01-06 DIAGNOSIS — I10 Essential (primary) hypertension: Secondary | ICD-10-CM | POA: Diagnosis not present

## 2022-01-06 DIAGNOSIS — C787 Secondary malignant neoplasm of liver and intrahepatic bile duct: Secondary | ICD-10-CM | POA: Diagnosis not present

## 2022-01-06 DIAGNOSIS — F419 Anxiety disorder, unspecified: Secondary | ICD-10-CM | POA: Insufficient documentation

## 2022-01-06 DIAGNOSIS — Z79899 Other long term (current) drug therapy: Secondary | ICD-10-CM | POA: Insufficient documentation

## 2022-01-06 DIAGNOSIS — F32A Depression, unspecified: Secondary | ICD-10-CM | POA: Insufficient documentation

## 2022-01-06 DIAGNOSIS — Z5112 Encounter for antineoplastic immunotherapy: Secondary | ICD-10-CM | POA: Insufficient documentation

## 2022-01-06 DIAGNOSIS — C189 Malignant neoplasm of colon, unspecified: Secondary | ICD-10-CM | POA: Diagnosis not present

## 2022-01-06 DIAGNOSIS — Z808 Family history of malignant neoplasm of other organs or systems: Secondary | ICD-10-CM | POA: Insufficient documentation

## 2022-01-06 DIAGNOSIS — C187 Malignant neoplasm of sigmoid colon: Secondary | ICD-10-CM | POA: Insufficient documentation

## 2022-01-06 DIAGNOSIS — D696 Thrombocytopenia, unspecified: Secondary | ICD-10-CM | POA: Insufficient documentation

## 2022-01-06 DIAGNOSIS — E876 Hypokalemia: Secondary | ICD-10-CM | POA: Diagnosis not present

## 2022-01-06 DIAGNOSIS — Z5111 Encounter for antineoplastic chemotherapy: Secondary | ICD-10-CM | POA: Diagnosis present

## 2022-01-06 LAB — CBC WITH DIFFERENTIAL/PLATELET
Abs Immature Granulocytes: 0.01 10*3/uL (ref 0.00–0.07)
Basophils Absolute: 0.1 10*3/uL (ref 0.0–0.1)
Basophils Relative: 1 %
Eosinophils Absolute: 0.1 10*3/uL (ref 0.0–0.5)
Eosinophils Relative: 2 %
HCT: 41.1 % (ref 36.0–46.0)
Hemoglobin: 14.1 g/dL (ref 12.0–15.0)
Immature Granulocytes: 0 %
Lymphocytes Relative: 24 %
Lymphs Abs: 1.8 10*3/uL (ref 0.7–4.0)
MCH: 31.7 pg (ref 26.0–34.0)
MCHC: 34.3 g/dL (ref 30.0–36.0)
MCV: 92.4 fL (ref 80.0–100.0)
Monocytes Absolute: 0.9 10*3/uL (ref 0.1–1.0)
Monocytes Relative: 12 %
Neutro Abs: 4.6 10*3/uL (ref 1.7–7.7)
Neutrophils Relative %: 61 %
Platelets: 134 10*3/uL — ABNORMAL LOW (ref 150–400)
RBC: 4.45 MIL/uL (ref 3.87–5.11)
RDW: 15.6 % — ABNORMAL HIGH (ref 11.5–15.5)
WBC: 7.4 10*3/uL (ref 4.0–10.5)
nRBC: 0 % (ref 0.0–0.2)

## 2022-01-06 LAB — COMPREHENSIVE METABOLIC PANEL
ALT: 24 U/L (ref 0–44)
AST: 32 U/L (ref 15–41)
Albumin: 3.9 g/dL (ref 3.5–5.0)
Alkaline Phosphatase: 81 U/L (ref 38–126)
Anion gap: 5 (ref 5–15)
BUN: 12 mg/dL (ref 8–23)
CO2: 28 mmol/L (ref 22–32)
Calcium: 8.6 mg/dL — ABNORMAL LOW (ref 8.9–10.3)
Chloride: 98 mmol/L (ref 98–111)
Creatinine, Ser: 0.91 mg/dL (ref 0.44–1.00)
GFR, Estimated: >60 mL/min (ref >60)
Glucose, Bld: 122 mg/dL — ABNORMAL HIGH (ref 70–99)
Potassium: 3.1 mmol/L — ABNORMAL LOW (ref 3.5–5.1)
Sodium: 131 mmol/L — ABNORMAL LOW (ref 135–145)
Total Bilirubin: 1 mg/dL (ref 0.3–1.2)
Total Protein: 7.5 g/dL (ref 6.5–8.1)

## 2022-01-06 LAB — MAGNESIUM: Magnesium: 2 mg/dL (ref 1.7–2.4)

## 2022-01-06 MED ORDER — SODIUM CHLORIDE 0.9 % IV SOLN
400.0000 mg/m2 | Freq: Once | INTRAVENOUS | Status: AC
  Administered 2022-01-06: 680 mg via INTRAVENOUS
  Filled 2022-01-06: qty 34

## 2022-01-06 MED ORDER — SODIUM CHLORIDE 0.9 % IV SOLN: Freq: Once | INTRAVENOUS | Status: AC

## 2022-01-06 MED ORDER — PALONOSETRON HCL INJECTION 0.25 MG/5ML
0.2500 mg | Freq: Once | INTRAVENOUS | Status: AC
  Administered 2022-01-06: 0.25 mg via INTRAVENOUS
  Filled 2022-01-06: qty 5

## 2022-01-06 MED ORDER — FLUOROURACIL CHEMO INJECTION 2.5 GM/50ML
320.0000 mg/m2 | Freq: Once | INTRAVENOUS | Status: AC
  Administered 2022-01-06: 550 mg via INTRAVENOUS
  Filled 2022-01-06: qty 11

## 2022-01-06 MED ORDER — HEPARIN SOD (PORK) LOCK FLUSH 100 UNIT/ML IV SOLN: 500.0000 [IU] | Freq: Once | INTRAVENOUS | Status: DC | PRN

## 2022-01-06 MED ORDER — SODIUM CHLORIDE 0.9 % IV SOLN
1920.0000 mg/m2 | INTRAVENOUS | Status: DC
  Administered 2022-01-06: 3250 mg via INTRAVENOUS
  Filled 2022-01-06: qty 65

## 2022-01-06 MED ORDER — SODIUM CHLORIDE 0.9% FLUSH: 10.0000 mL | INTRAVENOUS | Status: DC | PRN

## 2022-01-06 MED ORDER — SODIUM CHLORIDE 0.9 % IV SOLN
5.0000 mg/kg | Freq: Once | INTRAVENOUS | Status: AC
  Administered 2022-01-06: 400 mg via INTRAVENOUS
  Filled 2022-01-06: qty 16

## 2022-01-06 MED ORDER — POTASSIUM CHLORIDE CRYS ER 10 MEQ PO TBCR: 20.0000 meq | EXTENDED_RELEASE_TABLET | Freq: Every day | ORAL | 6 refills | Status: DC

## 2022-01-06 MED ORDER — SODIUM CHLORIDE 0.9 % IV SOLN
10.0000 mg | Freq: Once | INTRAVENOUS | Status: AC
  Administered 2022-01-06: 10 mg via INTRAVENOUS
  Filled 2022-01-06: qty 10

## 2022-01-06 NOTE — Progress Notes (Signed)
[place], [place] [address]   CLINIC:  Medical Oncology/Hematology  PCP:  Patient, No Pcp Per None None   REASON FOR VISIT:  Follow-up for metastatic colon cancer to liver  PRIOR THERAPY: Sigmoid colectomy and end colostomy on 04/29/2020  NGS Results: Foundation 1 KRAS wildtype, MS--stable, TMB 4 Muts/Mb  CURRENT THERAPY: FOLFOX, Avastin & Aloxi every 2 weeks  BRIEF ONCOLOGIC HISTORY:  Oncology History  Metastatic colon cancer to liver ([place])  05/16/2020 Initial Diagnosis   Metastatic colon cancer to liver ([place])   06/03/2020 Genetic Testing   Foundation One:     06/04/2020 -  Chemotherapy   Patient is on Treatment Plan : COLORECTAL FOLFOX + Bevacizumab q14d       CANCER STAGING:  Cancer Staging  No matching staging information was found for the patient.  INTERVAL HISTORY:  [name], a 61 y.o. female, returns for routine follow-up and consideration for next cycle of chemotherapy. Susan Martin was last seen on 11/25/2021.  Due for cycle #42 of FOLFOX + Bevacizumab today.   Overall, she tells me she has been feeling pretty well. She denies abdominal pain and diarrhea. She continues to take 2 tablets of potassium daily. She denies nosebleeds and hematochezia, and she reports easy bleeding and bruising. Her mouth sores have improved for which she continues to use lidocaine.    Overall, she feels ready for next cycle of chemo today.    REVIEW OF SYSTEMS:  Review of Systems  Constitutional:  Negative for appetite change and fatigue.  HENT:   Positive for mouth sores (improved). Negative for nosebleeds.   Gastrointestinal:  Positive for abdominal pain and diarrhea.  Hematological:  Bruises/bleeds easily.  All other systems reviewed and are negative.   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 Delton Coombes   04-29-2020  em[facility] surgery for perforated colon s/p sigmoid colectomy w/ colostomy; dx  Stage IV colon cancer mets to liver   Anxiety    Depression    Hypertension    followed by dr Glo Herring and oncology  (05-27-2020 per pt does not have pcp yet)   IDA (iron deficiency anemia)    Pulmonary nodule, right    Past Surgical History:  Procedure Laterality Date   ABDOMINAL HYSTERECTOMY  03-31-2016   @[facility]    W/  BILATERAL SALPINOOPHORECTOMY    APPLICATION OF WOUND VAC N/A 04/29/2020   Procedure: APPLICATION OF WOUND VAC;  Surgeon: Mickeal Skinner, MD;  Location: [facility];  Service: General;  Laterality: N/A;   ENDOMETRIAL ABLATION     LAPAROTOMY N/A 04/29/2020   Procedure: EXPLORATORY LAPAROTOMY;  Surgeon: Mickeal Skinner, MD;  Location: [facility];  Service: General;  Laterality: N/A;   PARTIAL COLECTOMY N/A 04/29/2020   Procedure: PARTIAL COLECTOMY WITH END COLOSTOMY;  Surgeon: Kieth Brightly Arta Bruce, MD;  Location: [facility];  Service: General;  Laterality: N/A;   PORTACATH PLACEMENT Right 05/28/2020   Procedure: INSERTION PORT-A-CATH;  Surgeon: Kinsinger, Arta Bruce, MD;  Location: [facility];  Service: General;  Laterality: Right;   SALPINGOOPHORECTOMY Left 03/31/2016   Procedure: LEFT SALPINGO OOPHORECTOMY WITH FROZEN SECTION,  ABDOMINAL HYSTERECTOMY WITH BILATERAL SALPINGO-OOPHORECTOMY;  Surgeon: Jonnie Kind, MD;  Location: [facility];  Service: Gynecology;  Laterality: Left;    SOCIAL HISTORY:  Social History   Socioeconomic History   Marital status: Divorced    Spouse name: Not  on file   Number of children: Not on file   Years of education: Not on file   Highest education level: Not on file  Occupational History   Not on file  Tobacco Use   Smoking status: Former    Packs/day: 0.50    Years: 5.00    Total pack years: 2.50    Types: Cigarettes    Quit date: 03/28/2015    Years since quitting: 6.7   Smokeless tobacco: Never  Vaping Use   Vaping Use: Never used   Substance and Sexual Activity   Alcohol use: No   Drug use: Never   Sexual activity: Yes    Partners: Male    Birth control/protection: Surgical  Other Topics Concern   Not on file  Social History Narrative   Not on file   Social Determinants of Health   Financial Resource Strain: Low Risk  (11/01/2019)   Overall Financial Resource Strain (CARDIA)    Difficulty of Paying Living Expenses: Not very hard  Food Insecurity: No Food Insecurity (11/01/2019)   Hunger Vital Sign    Worried About Running Out of Food in the Last Year: Never true    Ran Out of Food in the Last Year: Never true  Transportation Needs: No Transportation Needs (11/01/2019)   PRAPARE - Hydrologist (Medical): No    Lack of Transportation (Non-Medical): No  Physical Activity: Insufficiently Active (11/01/2019)   Exercise Vital Sign    Days of Exercise per Week: 2 days    Minutes of Exercise per Session: 30 min  Stress: Stress Concern Present (11/01/2019)   [place]    Feeling of Stress : To some extent  Social Connections: Moderately Integrated (11/01/2019)   Social Connection and Isolation Panel [NHANES]    Frequency of Communication with Friends and Family: More than three times a week    Frequency of Social Gatherings with Friends and Family: Once a week    Attends Religious Services: More than 4 times per year    Active Member of [employer] or Organizations: No    Attends Archivist Meetings: Never    Marital Status: Living with partner  Intimate Partner Violence: Not At Risk (11/01/2019)   Humiliation, Afraid, Rape, and Kick questionnaire    Fear of Current or Ex-Partner: No    Emotionally Abused: No    Physically Abused: No    Sexually Abused: No    FAMILY HISTORY:  Family History  Problem Relation Age of Onset   Hypertension Mother    Diabetes Mother    Cirrhosis Father     CURRENT MEDICATIONS:   Current Outpatient Medications  Medication Sig Dispense Refill   amLODipine (NORVASC) 5 MG tablet Take 1 tablet (5 mg total) by mouth daily. 90 tablet 1   BEVACIZUMAB IV Inject 5 mg/kg into the vein every 14 (fourteen) days.     clonazePAM (KLONOPIN) 1 MG tablet TAKE 1 TABLET BY MOUTH 2 TIMES DAILY 60 tablet 3   fluorouracil CALGB 50932 in sodium chloride 0.9 % 150 mL Inject 2,400 mg/m2 into the vein over 48 hr.     gabapentin (NEURONTIN) 100 MG capsule TAKE 1 CAPSULE (100 MG TOTAL) BY MOUTH THREE TIMES DAILY. 90 capsule 3   LEUCOVORIN CALCIUM IV Inject 400 mg/m2 into the vein every 21 ( twenty-one) days.     lidocaine (XYLOCAINE) 2 % solution Use as directed 15 mLs in the mouth  or throat every 6 (six) hours as needed for mouth pain. Swish and spit/swallow every six hours as needed for mouth pain 480 mL 3   lidocaine-prilocaine (EMLA) cream Apply small amount to port a cath site and cover with plastic wrap 1 hour prior to chemotherapy appointments 30 g 3   LORazepam (ATIVAN) 1 MG tablet Take 1 tablet (1 mg total) by mouth every 6 (six) hours as needed. for anxiety 120 tablet 1   nystatin (MYCOSTATIN) 100000 UNIT/ML suspension Take by mouth.     PARoxetine (PAXIL) 40 MG tablet Take 1 tablet (40 mg total) by mouth every morning. 90 tablet 3   polyethylene glycol (MIRALAX / GLYCOLAX) packet Take 17 g by mouth every other day.     prochlorperazine (COMPAZINE) 10 MG tablet Take 1 tablet (10 mg total) by mouth every 6 (six) hours as needed (Nausea or vomiting). 30 tablet 1   triamterene-hydrochlorothiazide (MAXZIDE-25) 37.5-25 MG tablet TAKE 1 TABLET BY MOUTH EVERY DAY 90 tablet 1   potassium chloride (KLOR-CON M) 10 MEQ tablet Take 2 tablets (20 mEq total) by mouth daily. 90 tablet 6   No current facility-administered medications for this visit.    ALLERGIES:  No Known Allergies  PHYSICAL EXAM:  Performance status (ECOG): 1 - Symptomatic but completely ambulatory  Vitals:   01/06/22 0951   BP: (!) 147/82  Pulse: 79  Resp: 18  Temp: 99.1 F (37.3 C)  SpO2: 95%   Wt Readings from Last 3 Encounters:  12/23/21 171 lb 3.2 oz (77.7 kg)  12/09/21 170 lb 12.8 oz (77.5 kg)  11/25/21 172 lb 12.8 oz (78.4 kg)   Physical Exam Vitals reviewed.  Constitutional:      Appearance: Normal appearance.  Cardiovascular:     Rate and Rhythm: Normal rate and regular rhythm.     Pulses: Normal pulses.     Heart sounds: Normal heart sounds.  Pulmonary:     Effort: Pulmonary effort is normal.     Breath sounds: Normal breath sounds.  Abdominal:     Palpations: Abdomen is soft. There is no mass.     Tenderness: There is no abdominal tenderness.  Musculoskeletal:     Right lower leg: No edema.     Left lower leg: No edema.  Neurological:     General: No focal deficit present.     Mental Status: She is alert and oriented to person, place, and time.  Psychiatric:        Mood and Affect: Mood normal.        Behavior: Behavior normal.     LABORATORY DATA:  I have reviewed the labs as listed.     Latest Ref Rng & Units 01/06/2022    9:43 AM 12/23/2021    7:43 AM 12/09/2021    8:02 AM  CBC  WBC 4.0 - 10.5 K/uL 7.4  5.0  5.3   Hemoglobin 12.0 - 15.0 g/dL 14.1  13.5  13.4   Hematocrit 36.0 - 46.0 % 41.1  39.9  40.1   Platelets 150 - 400 K/uL 134  143  133       Latest Ref Rng & Units 01/06/2022    9:43 AM 12/23/2021    7:43 AM 12/09/2021    8:02 AM  CMP  Glucose 70 - 99 mg/dL 122  136  103   BUN 8 - 23 mg/dL 12  13  10    Creatinine 0.44 - 1.00 mg/dL 0.91  0.90  0.93  Sodium 135 - 145 mmol/L 131  135  137   Potassium 3.5 - 5.1 mmol/L 3.1  3.1  3.4   Chloride 98 - 111 mmol/L 98  99  101   CO2 22 - 32 mmol/L 28  26  29    Calcium 8.9 - 10.3 mg/dL 8.6  8.8  8.8   Total Protein 6.5 - 8.1 g/dL 7.5  7.5  7.0   Total Bilirubin 0.3 - 1.2 mg/dL 1.0  0.9  0.6   Alkaline Phos 38 - 126 U/L 81  77  78   AST 15 - 41 U/L 32  27  32   ALT 0 - 44 U/L 24  25  30      DIAGNOSTIC IMAGING:   I have independently reviewed the scans and discussed with the patient. No results found.   ASSESSMENT:  1.  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 -CT chest on 05/02/2020 with no evidence of pulmonary metastatic disease. -CTAP on 05/07/2020 with multiple lesions throughout the liver, largest in the right lobe measuring 3.9 cm, lateral segment left lobe measuring 2.6 cm and inferior right lobe confluent lesion 4.2 x 3.7 cm.  No lymphadenopathy. -CEA on 05/02/2020-60.2. -FOLFOX and bevacizumab started on 06/04/2020. 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   2.  Iron deficiency anemia: -Feraheme on 04/30/2020 and 05/06/2020.   3.  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 -Smoked for 3 to 4 years and quit. -Mother had cancer, type unknown.  Maternal uncle had lung cancer.  Maternal aunt had lung cancer.   PLAN:  1.  Metastatic colon adenocarcinoma to liver, MS-stable: - CT CAP (10/21/2021): Stable liver lesions, stable bilateral lung lesions. - She is tolerating maintenance 5-FU and bevacizumab very well. - Reviewed labs today which showed normal LFTs.  CBC was grossly normal with mild thrombocytopenia.  Last CEA was 1.7.  UA was negative for protein. - Proceed with treatment today and in 2 weeks.  RTC 4 weeks.  Repeat CT CAP and CEA prior to next visit.   2.  Hypertension: - Continue triamterene/HCTZ and Norvasc 5 mg daily.   3.  Severe hypokalemia: - She is taking potassium 20 mEq daily.  Potassium is 3.1. - Increase K-Dur to 30 mEq daily.   4.  Peripheral neuropathy: - Continue gabapentin 100 mg in the mornings.   5.  Anxiety: - Continue Paxil and Klonopin 1  mg twice daily.  6.  Mucositis: - Continue Vaseline and lidocaine gel as needed.   Orders placed this encounter:  Orders Placed This Encounter  Procedures   CT [facility] [place], MD [place] [phone]   I, Thana Ates, am acting as a scribe for Dr. Derek Jack.  I, Derek Jack MD, have reviewed the above documentation for accuracy and completeness, and I agree with the above.

## 2022-01-06 NOTE — Patient Instructions (Signed)
[facility]  Discharge Instructions: Thank you for choosing [facility] to provide your oncology and hematology care.  If you have a lab appointment with the [facility], please come in thru the Main Entrance and check in at the main information desk.  Wear comfortable clothing and clothing appropriate for easy access to any Portacath or PICC line.   We strive to give you quality time with your provider. You may need to reschedule your appointment if you arrive late (15 or more minutes).  Arriving late affects you and other patients whose appointments are after yours.  Also, if you miss three or more appointments without notifying the office, you may be dismissed from the clinic at the provider's discretion.      For prescription refill requests, have your pharmacy contact our office and allow 72 hours for refills to be completed.    Today you received the following chemotherapy and/or immunotherapy agents Avastin Leucovorin fluorouracil   with 5 FU pump start   To help prevent nausea and vomiting after your treatment, we encourage you to take your nausea medication as directed.  BELOW ARE SYMPTOMS THAT SHOULD BE REPORTED IMMEDIATELY: *FEVER GREATER THAN 100.4 F (38 C) OR HIGHER *CHILLS OR SWEATING *NAUSEA AND VOMITING THAT IS NOT CONTROLLED WITH YOUR NAUSEA MEDICATION *UNUSUAL SHORTNESS OF BREATH *UNUSUAL BRUISING OR BLEEDING *URINARY PROBLEMS (pain or burning when urinating, or frequent urination) *BOWEL PROBLEMS (unusual diarrhea, constipation, pain near the anus) TENDERNESS IN MOUTH AND THROAT WITH OR WITHOUT PRESENCE OF ULCERS (sore throat, sores in mouth, or a toothache) UNUSUAL RASH, SWELLING OR PAIN  UNUSUAL VAGINAL DISCHARGE OR ITCHING   Items with * indicate a potential emergency and should be followed up as soon as possible or go to the Emergency Department if any problems should occur.  Please show the CHEMOTHERAPY ALERT CARD or IMMUNOTHERAPY  ALERT CARD at check-in to the Emergency Department and triage nurse.  Should you have questions after your visit or need to cancel or reschedule your appointment, please contact [facility] [phone]  and follow the prompts.  Office hours are 8:00 a.m. to 4:30 p.m. Monday - Friday. Please note that voicemails left after 4:00 p.m. may not be returned until the following business day.  We are closed weekends and major holidays. You have access to a nurse at all times for urgent questions. Please call the main number to the clinic [phone] and follow the prompts.  For any non-urgent questions, you may also contact your provider using MyChart. We now offer e-Visits for anyone 8 and older to request care online for non-urgent symptoms. For details visit [email or web address].   Also download the MyChart app! Go to the app store, search "MyChart", open the app, select [facility], and log in with your MyChart username and password.  Masks are optional in the cancer centers. If you would like for your care team to wear a mask while they are taking care of you, please let them know. For doctor visits, patients may have with them one support person who is at least 62 years old. At this time, visitors are not allowed in the infusion area.

## 2022-01-06 NOTE — Progress Notes (Signed)
Patient presents today for Avastin/Leucovorin/Fluorouracil/ with pump start per providers order.  Vital signs and labs within parameters for treatment.  Message received from Anastasio Champion RN/Dr. Delton Coombes patient okay for treatment.    Avasitn/Leucovorin/Fluorouracil with pump start given today per MD orders.  Stable during infusion without adverse affects.  5FU pump connected and verified RUN on the screen with patient.  Vital signs stable.  No complaints at this time.  Discharge from clinic ambulatory in stable condition.  Alert and oriented X 3.  Follow up with [facility] as scheduled.

## 2022-01-06 NOTE — Patient Instructions (Signed)
[facility] at [facility] Discharge Instructions   You were seen and examined today by Dr. Delton Coombes.  He reviewed your lab work which is normal/stable. Your potassium is a little low. We sent a prescription for potassium - take 3 tablets a day.  We will proceed with treatment today.   Return as scheduled.    Thank you for choosing [facility] at [facility] to provide your oncology and hematology care.  To afford each patient quality time with our provider, please arrive at least 15 minutes before your scheduled appointment time.   If you have a lab appointment with the [facility] please come in thru the Main Entrance and check in at the main information desk.  You need to re-schedule your appointment should you arrive 10 or more minutes late.  We strive to give you quality time with our providers, and arriving late affects you and other patients whose appointments are after yours.  Also, if you no show three or more times for appointments you may be dismissed from the clinic at the providers discretion.     Again, thank you for choosing [facility].  Our hope is that these requests will decrease the amount of time that you wait before being seen by our physicians.       _____________________________________________________________  Should you have questions after your visit to [facility], please contact our office at [phone] and follow the prompts.  Our office hours are 8:00 a.m. and 4:30 p.m. Monday - Friday.  Please note that voicemails left after 4:00 p.m. may not be returned until the following business day.  We are closed weekends and major holidays.  You do have access to a nurse 24-7, just call the main number to the clinic [phone] and do not press any options, hold on the line and a nurse will answer the phone.    For prescription refill requests, have your pharmacy contact our office and allow 72  hours.    Due to Covid, you will need to wear a mask upon entering the hospital. If you do not have a mask, a mask will be given to you at the Main Entrance upon arrival. For doctor visits, patients may have 1 support person age 75 or older with them. For treatment visits, patients can not have anyone with them due to social distancing guidelines and our immunocompromised population.

## 2022-01-08 ENCOUNTER — Encounter (HOSPITAL_COMMUNITY): Payer: Self-pay

## 2022-01-08 ENCOUNTER — Inpatient Hospital Stay (HOSPITAL_COMMUNITY): Payer: Medicaid Other

## 2022-01-08 VITALS — BP 152/79 | HR 81 | Temp 97.8°F | Resp 18

## 2022-01-08 DIAGNOSIS — C189 Malignant neoplasm of colon, unspecified: Secondary | ICD-10-CM

## 2022-01-08 DIAGNOSIS — Z5112 Encounter for antineoplastic immunotherapy: Secondary | ICD-10-CM | POA: Diagnosis not present

## 2022-01-08 MED ORDER — HEPARIN SOD (PORK) LOCK FLUSH 100 UNIT/ML IV SOLN
500.0000 [IU] | Freq: Once | INTRAVENOUS | Status: AC | PRN
  Administered 2022-01-08: 500 [IU]

## 2022-01-08 MED ORDER — SODIUM CHLORIDE 0.9% FLUSH
10.0000 mL | INTRAVENOUS | Status: DC | PRN
  Administered 2022-01-08: 10 mL

## 2022-01-08 NOTE — Progress Notes (Signed)
Patients port flushed without difficulty.  Good blood return noted with no bruising or swelling noted at site.  Band aid applied.  VSS with discharge and left in satisfactory condition with no s/s of distress noted.   

## 2022-01-08 NOTE — Patient Instructions (Signed)
[facility]  Discharge Instructions: Thank you for choosing [facility] to provide your oncology and hematology care.  If you have a lab appointment with the Cancer Center, please come in thru the Main Entrance and check in at the main information desk.  Wear comfortable clothing and clothing appropriate for easy access to any Portacath or PICC line.   We strive to give you quality time with your provider. You may need to reschedule your appointment if you arrive late (15 or more minutes).  Arriving late affects you and other patients whose appointments are after yours.  Also, if you miss three or more appointments without notifying the office, you may be dismissed from the clinic at the provider's discretion.      For prescription refill requests, have your pharmacy contact our office and allow 72 hours for refills to be completed.         To help prevent nausea and vomiting after your treatment, we encourage you to take your nausea medication as directed.  BELOW ARE SYMPTOMS THAT SHOULD BE REPORTED IMMEDIATELY: *FEVER GREATER THAN 100.4 F (38 C) OR HIGHER *CHILLS OR SWEATING *NAUSEA AND VOMITING THAT IS NOT CONTROLLED WITH YOUR NAUSEA MEDICATION *UNUSUAL SHORTNESS OF BREATH *UNUSUAL BRUISING OR BLEEDING *URINARY PROBLEMS (pain or burning when urinating, or frequent urination) *BOWEL PROBLEMS (unusual diarrhea, constipation, pain near the anus) TENDERNESS IN MOUTH AND THROAT WITH OR WITHOUT PRESENCE OF ULCERS (sore throat, sores in mouth, or a toothache) UNUSUAL RASH, SWELLING OR PAIN  UNUSUAL VAGINAL DISCHARGE OR ITCHING   Items with * indicate a potential emergency and should be followed up as soon as possible or go to the Emergency Department if any problems should occur.  Please show the CHEMOTHERAPY ALERT CARD or IMMUNOTHERAPY ALERT CARD at check-in to the Emergency Department and triage nurse.  Should you have questions after your visit or need to  cancel or reschedule your appointment, please contact [facility] [phone]  and follow the prompts.  Office hours are 8:00 a.m. to 4:30 p.m. Monday - Friday. Please note that voicemails left after 4:00 p.m. may not be returned until the following business day.  We are closed weekends and major holidays. You have access to a nurse at all times for urgent questions. Please call the main number to the clinic [phone] and follow the prompts.  For any non-urgent questions, you may also contact your provider using MyChart. We now offer e-Visits for anyone 18 and older to request care online for non-urgent symptoms. For details visit [email or web address].   Also download the MyChart app! Go to the app store, search "MyChart", open the app, select [facility], and log in with your MyChart username and password.  Masks are optional in the cancer centers. If you would like for your care team to wear a mask while they are taking care of you, please let them know. For doctor visits, patients may have with them one support person who is at least 62 years old. At this time, visitors are not allowed in the infusion area.  

## 2022-01-16 ENCOUNTER — Other Ambulatory Visit (HOSPITAL_COMMUNITY): Payer: Self-pay | Admitting: *Deleted

## 2022-01-16 DIAGNOSIS — F419 Anxiety disorder, unspecified: Secondary | ICD-10-CM

## 2022-01-16 MED ORDER — CLONAZEPAM 1 MG PO TABS: 1.0000 mg | ORAL_TABLET | Freq: Two times a day (BID) | ORAL | 3 refills | Status: DC

## 2022-01-19 ENCOUNTER — Other Ambulatory Visit: Payer: Self-pay

## 2022-01-20 ENCOUNTER — Inpatient Hospital Stay (HOSPITAL_COMMUNITY): Payer: Medicaid Other

## 2022-01-20 VITALS — BP 154/90 | HR 76 | Temp 97.7°F | Resp 18

## 2022-01-20 DIAGNOSIS — Z5112 Encounter for antineoplastic immunotherapy: Secondary | ICD-10-CM | POA: Diagnosis not present

## 2022-01-20 DIAGNOSIS — C189 Malignant neoplasm of colon, unspecified: Secondary | ICD-10-CM

## 2022-01-20 LAB — CBC WITH DIFFERENTIAL/PLATELET
Abs Immature Granulocytes: 0.01 10*3/uL (ref 0.00–0.07)
Basophils Absolute: 0 10*3/uL (ref 0.0–0.1)
Basophils Relative: 1 %
Eosinophils Absolute: 0.1 10*3/uL (ref 0.0–0.5)
Eosinophils Relative: 3 %
HCT: 40.9 % (ref 36.0–46.0)
Hemoglobin: 13.8 g/dL (ref 12.0–15.0)
Immature Granulocytes: 0 %
Lymphocytes Relative: 29 %
Lymphs Abs: 1.5 10*3/uL (ref 0.7–4.0)
MCH: 31.6 pg (ref 26.0–34.0)
MCHC: 33.7 g/dL (ref 30.0–36.0)
MCV: 93.6 fL (ref 80.0–100.0)
Monocytes Absolute: 0.4 10*3/uL (ref 0.1–1.0)
Monocytes Relative: 8 %
Neutro Abs: 3 10*3/uL (ref 1.7–7.7)
Neutrophils Relative %: 59 %
Platelets: 123 10*3/uL — ABNORMAL LOW (ref 150–400)
RBC: 4.37 MIL/uL (ref 3.87–5.11)
RDW: 15.4 % (ref 11.5–15.5)
WBC: 5.1 10*3/uL (ref 4.0–10.5)
nRBC: 0 % (ref 0.0–0.2)

## 2022-01-20 LAB — COMPREHENSIVE METABOLIC PANEL
ALT: 31 U/L (ref 0–44)
AST: 31 U/L (ref 15–41)
Albumin: 3.7 g/dL (ref 3.5–5.0)
Alkaline Phosphatase: 74 U/L (ref 38–126)
Anion gap: 8 (ref 5–15)
BUN: 16 mg/dL (ref 8–23)
CO2: 25 mmol/L (ref 22–32)
Calcium: 9 mg/dL (ref 8.9–10.3)
Chloride: 102 mmol/L (ref 98–111)
Creatinine, Ser: 1.05 mg/dL — ABNORMAL HIGH (ref 0.44–1.00)
GFR, Estimated: >60 mL/min (ref >60)
Glucose, Bld: 131 mg/dL — ABNORMAL HIGH (ref 70–99)
Potassium: 3.2 mmol/L — ABNORMAL LOW (ref 3.5–5.1)
Sodium: 135 mmol/L (ref 135–145)
Total Bilirubin: 0.7 mg/dL (ref 0.3–1.2)
Total Protein: 7.3 g/dL (ref 6.5–8.1)

## 2022-01-20 LAB — URINALYSIS, DIPSTICK ONLY
Bilirubin Urine: NEGATIVE
Glucose, UA: NEGATIVE mg/dL
Hgb urine dipstick: NEGATIVE
Ketones, ur: NEGATIVE mg/dL
Leukocytes,Ua: NEGATIVE
Nitrite: NEGATIVE
Protein, ur: NEGATIVE mg/dL
Specific Gravity, Urine: 1.018 (ref 1.005–1.030)
pH: 5 (ref 5.0–8.0)

## 2022-01-20 LAB — MAGNESIUM: Magnesium: 1.9 mg/dL (ref 1.7–2.4)

## 2022-01-20 MED ORDER — SODIUM CHLORIDE 0.9 % IV SOLN
1920.0000 mg/m2 | INTRAVENOUS | Status: DC
  Administered 2022-01-20: 3250 mg via INTRAVENOUS
  Filled 2022-01-20: qty 65

## 2022-01-20 MED ORDER — SODIUM CHLORIDE 0.9% FLUSH
10.0000 mL | INTRAVENOUS | Status: DC | PRN
  Administered 2022-01-20: 10 mL

## 2022-01-20 MED ORDER — PALONOSETRON HCL INJECTION 0.25 MG/5ML
0.2500 mg | Freq: Once | INTRAVENOUS | Status: AC
  Administered 2022-01-20: 0.25 mg via INTRAVENOUS
  Filled 2022-01-20: qty 5

## 2022-01-20 MED ORDER — SODIUM CHLORIDE 0.9 % IV SOLN
400.0000 mg/m2 | Freq: Once | INTRAVENOUS | Status: AC
  Administered 2022-01-20: 680 mg via INTRAVENOUS
  Filled 2022-01-20: qty 34

## 2022-01-20 MED ORDER — SODIUM CHLORIDE 0.9 % IV SOLN: Freq: Once | INTRAVENOUS | Status: AC

## 2022-01-20 MED ORDER — SODIUM CHLORIDE 0.9 % IV SOLN
10.0000 mg | Freq: Once | INTRAVENOUS | Status: AC
  Administered 2022-01-20: 10 mg via INTRAVENOUS
  Filled 2022-01-20: qty 10

## 2022-01-20 MED ORDER — HEPARIN SOD (PORK) LOCK FLUSH 100 UNIT/ML IV SOLN: 500.0000 [IU] | Freq: Once | INTRAVENOUS | Status: DC | PRN

## 2022-01-20 MED ORDER — FLUOROURACIL CHEMO INJECTION 2.5 GM/50ML
320.0000 mg/m2 | Freq: Once | INTRAVENOUS | Status: AC
  Administered 2022-01-20: 550 mg via INTRAVENOUS
  Filled 2022-01-20: qty 11

## 2022-01-20 MED ORDER — SODIUM CHLORIDE 0.9 % IV SOLN
5.0000 mg/kg | Freq: Once | INTRAVENOUS | Status: AC
  Administered 2022-01-20: 400 mg via INTRAVENOUS
  Filled 2022-01-20: qty 16

## 2022-01-20 NOTE — Progress Notes (Signed)
Patient presents today for Bevacizumab/Leucovorin/Fluorouracil with 5FU pump start.  Vital signs within parameters for treatment.  Labs pending.  Patient has no new complaints at this time.  Labs within parameters for treatment.  Treatment given today per MD orders.  Stable during infusion without adverse affects.  5FU pump connected and verified RUN on the screen with patient.  Vital signs stable.  No complaints at this time.  Discharge from clinic ambulatory in stable condition.  Alert and oriented X 3.  Follow up with [facility] as scheduled.

## 2022-01-20 NOTE — Patient Instructions (Signed)
[facility]  Discharge Instructions: Thank you for choosing [facility] to provide your oncology and hematology care.  If you have a lab appointment with the [facility], please come in thru the Main Entrance and check in at the main information desk.  Wear comfortable clothing and clothing appropriate for easy access to any Portacath or PICC line.   We strive to give you quality time with your provider. You may need to reschedule your appointment if you arrive late (15 or more minutes).  Arriving late affects you and other patients whose appointments are after yours.  Also, if you miss three or more appointments without notifying the office, you may be dismissed from the clinic at the provider's discretion.      For prescription refill requests, have your pharmacy contact our office and allow 72 hours for refills to be completed.    Today you received the following chemotherapy and/or immunotherapy agents Bevacizumab Leucovorin Fluorouracil with pump start      To help prevent nausea and vomiting after your treatment, we encourage you to take your nausea medication as directed.  BELOW ARE SYMPTOMS THAT SHOULD BE REPORTED IMMEDIATELY: *FEVER GREATER THAN 100.4 F (38 C) OR HIGHER *CHILLS OR SWEATING *NAUSEA AND VOMITING THAT IS NOT CONTROLLED WITH YOUR NAUSEA MEDICATION *UNUSUAL SHORTNESS OF BREATH *UNUSUAL BRUISING OR BLEEDING *URINARY PROBLEMS (pain or burning when urinating, or frequent urination) *BOWEL PROBLEMS (unusual diarrhea, constipation, pain near the anus) TENDERNESS IN MOUTH AND THROAT WITH OR WITHOUT PRESENCE OF ULCERS (sore throat, sores in mouth, or a toothache) UNUSUAL RASH, SWELLING OR PAIN  UNUSUAL VAGINAL DISCHARGE OR ITCHING   Items with * indicate a potential emergency and should be followed up as soon as possible or go to the Emergency Department if any problems should occur.  Please show the CHEMOTHERAPY ALERT CARD or IMMUNOTHERAPY  ALERT CARD at check-in to the Emergency Department and triage nurse.  Should you have questions after your visit or need to cancel or reschedule your appointment, please contact [facility] [phone]  and follow the prompts.  Office hours are 8:00 a.m. to 4:30 p.m. Monday - Friday. Please note that voicemails left after 4:00 p.m. may not be returned until the following business day.  We are closed weekends and major holidays. You have access to a nurse at all times for urgent questions. Please call the main number to the clinic [phone] and follow the prompts.  For any non-urgent questions, you may also contact your provider using MyChart. We now offer e-Visits for anyone 35 and older to request care online for non-urgent symptoms. For details visit [email or web address].   Also download the MyChart app! Go to the app store, search "MyChart", open the app, select [facility], and log in with your MyChart username and password.  Masks are optional in the cancer centers. If you would like for your care team to wear a mask while they are taking care of you, please let them know. For doctor visits, patients may have with them one support person who is at least 62 years old. At this time, visitors are not allowed in the infusion area.

## 2022-01-20 NOTE — Progress Notes (Signed)
Maintain 5FU pump at 3250 mg despite weight increase. Right at 10% difference.  T.O. Dr Rhys Martini, PharmD

## 2022-01-21 LAB — CEA: CEA: 1.7 ng/mL (ref 0.0–4.7)

## 2022-01-22 ENCOUNTER — Inpatient Hospital Stay (HOSPITAL_COMMUNITY): Payer: Medicaid Other

## 2022-01-22 VITALS — BP 150/80 | HR 71 | Temp 97.7°F | Resp 18

## 2022-01-22 DIAGNOSIS — C189 Malignant neoplasm of colon, unspecified: Secondary | ICD-10-CM

## 2022-01-22 DIAGNOSIS — Z5112 Encounter for antineoplastic immunotherapy: Secondary | ICD-10-CM | POA: Diagnosis not present

## 2022-01-22 MED ORDER — SODIUM CHLORIDE 0.9% FLUSH
10.0000 mL | INTRAVENOUS | Status: DC | PRN
  Administered 2022-01-22: 10 mL

## 2022-01-22 MED ORDER — HEPARIN SOD (PORK) LOCK FLUSH 100 UNIT/ML IV SOLN
500.0000 [IU] | Freq: Once | INTRAVENOUS | Status: AC | PRN
  Administered 2022-01-22: 500 [IU]

## 2022-01-22 NOTE — Patient Instructions (Signed)
[facility]  Discharge Instructions: Thank you for choosing [facility] to provide your oncology and hematology care.  If you have a lab appointment with the Cancer Center, please come in thru the Main Entrance and check in at the main information desk.  Wear comfortable clothing and clothing appropriate for easy access to any Portacath or PICC line.   We strive to give you quality time with your provider. You may need to reschedule your appointment if you arrive late (15 or more minutes).  Arriving late affects you and other patients whose appointments are after yours.  Also, if you miss three or more appointments without notifying the office, you may be dismissed from the clinic at the provider's discretion.      For prescription refill requests, have your pharmacy contact our office and allow 72 hours for refills to be completed.         To help prevent nausea and vomiting after your treatment, we encourage you to take your nausea medication as directed.  BELOW ARE SYMPTOMS THAT SHOULD BE REPORTED IMMEDIATELY: *FEVER GREATER THAN 100.4 F (38 C) OR HIGHER *CHILLS OR SWEATING *NAUSEA AND VOMITING THAT IS NOT CONTROLLED WITH YOUR NAUSEA MEDICATION *UNUSUAL SHORTNESS OF BREATH *UNUSUAL BRUISING OR BLEEDING *URINARY PROBLEMS (pain or burning when urinating, or frequent urination) *BOWEL PROBLEMS (unusual diarrhea, constipation, pain near the anus) TENDERNESS IN MOUTH AND THROAT WITH OR WITHOUT PRESENCE OF ULCERS (sore throat, sores in mouth, or a toothache) UNUSUAL RASH, SWELLING OR PAIN  UNUSUAL VAGINAL DISCHARGE OR ITCHING   Items with * indicate a potential emergency and should be followed up as soon as possible or go to the Emergency Department if any problems should occur.  Please show the CHEMOTHERAPY ALERT CARD or IMMUNOTHERAPY ALERT CARD at check-in to the Emergency Department and triage nurse.  Should you have questions after your visit or need to  cancel or reschedule your appointment, please contact [facility] [phone]  and follow the prompts.  Office hours are 8:00 a.m. to 4:30 p.m. Monday - Friday. Please note that voicemails left after 4:00 p.m. may not be returned until the following business day.  We are closed weekends and major holidays. You have access to a nurse at all times for urgent questions. Please call the main number to the clinic [phone] and follow the prompts.  For any non-urgent questions, you may also contact your provider using MyChart. We now offer e-Visits for anyone 18 and older to request care online for non-urgent symptoms. For details visit [email or web address].   Also download the MyChart app! Go to the app store, search "MyChart", open the app, select [facility], and log in with your MyChart username and password.  Masks are optional in the cancer centers. If you would like for your care team to wear a mask while they are taking care of you, please let them know. For doctor visits, patients may have with them one support person who is at least 62 years old. At this time, visitors are not allowed in the infusion area.  

## 2022-01-22 NOTE — Progress Notes (Signed)
Patient presents today for 5FU home infusion pump d/c.  Patient is in satisfactory condition with no complaints voiced.  Vital signs are stable.  Port flushed with good blood return noted.  Patient left ambulatory in stable condition.

## 2022-01-26 ENCOUNTER — Ambulatory Visit (HOSPITAL_COMMUNITY)
Admission: RE | Admit: 2022-01-26 | Discharge: 2022-01-26 | Disposition: A | Discharge status: Home / Self Care | Payer: Medicaid Other | Source: Ambulatory Visit | Attending: Hematology | Admitting: Hematology

## 2022-01-26 DIAGNOSIS — C189 Malignant neoplasm of colon, unspecified: Secondary | ICD-10-CM | POA: Diagnosis present

## 2022-01-26 DIAGNOSIS — C787 Secondary malignant neoplasm of liver and intrahepatic bile duct: Secondary | ICD-10-CM | POA: Diagnosis present

## 2022-01-26 MED ORDER — IOHEXOL 300 MG/ML  SOLN
100.0000 mL | Freq: Once | INTRAMUSCULAR | Status: AC | PRN
  Administered 2022-01-26: 100 mL via INTRAVENOUS

## 2022-02-03 ENCOUNTER — Inpatient Hospital Stay: Payer: Medicaid Other

## 2022-02-03 ENCOUNTER — Inpatient Hospital Stay (HOSPITAL_BASED_OUTPATIENT_CLINIC_OR_DEPARTMENT_OTHER): Payer: Medicaid Other | Admitting: Hematology

## 2022-02-03 ENCOUNTER — Other Ambulatory Visit: Payer: Self-pay

## 2022-02-03 ENCOUNTER — Inpatient Hospital Stay: Payer: Medicaid Other | Attending: Hematology

## 2022-02-03 VITALS — BP 142/83 | HR 82 | Temp 97.7°F | Resp 18

## 2022-02-03 DIAGNOSIS — F419 Anxiety disorder, unspecified: Secondary | ICD-10-CM | POA: Insufficient documentation

## 2022-02-03 DIAGNOSIS — I10 Essential (primary) hypertension: Secondary | ICD-10-CM | POA: Diagnosis not present

## 2022-02-03 DIAGNOSIS — D696 Thrombocytopenia, unspecified: Secondary | ICD-10-CM | POA: Insufficient documentation

## 2022-02-03 DIAGNOSIS — D509 Iron deficiency anemia, unspecified: Secondary | ICD-10-CM | POA: Insufficient documentation

## 2022-02-03 DIAGNOSIS — Z87891 Personal history of nicotine dependence: Secondary | ICD-10-CM | POA: Diagnosis not present

## 2022-02-03 DIAGNOSIS — Z5111 Encounter for antineoplastic chemotherapy: Secondary | ICD-10-CM | POA: Diagnosis not present

## 2022-02-03 DIAGNOSIS — Z809 Family history of malignant neoplasm, unspecified: Secondary | ICD-10-CM | POA: Insufficient documentation

## 2022-02-03 DIAGNOSIS — Z79899 Other long term (current) drug therapy: Secondary | ICD-10-CM | POA: Diagnosis not present

## 2022-02-03 DIAGNOSIS — C787 Secondary malignant neoplasm of liver and intrahepatic bile duct: Secondary | ICD-10-CM

## 2022-02-03 DIAGNOSIS — G35 Multiple sclerosis: Secondary | ICD-10-CM | POA: Insufficient documentation

## 2022-02-03 DIAGNOSIS — I700 Atherosclerosis of aorta: Secondary | ICD-10-CM | POA: Diagnosis not present

## 2022-02-03 DIAGNOSIS — K1230 Oral mucositis (ulcerative), unspecified: Secondary | ICD-10-CM | POA: Diagnosis not present

## 2022-02-03 DIAGNOSIS — R918 Other nonspecific abnormal finding of lung field: Secondary | ICD-10-CM | POA: Diagnosis not present

## 2022-02-03 DIAGNOSIS — E876 Hypokalemia: Secondary | ICD-10-CM | POA: Diagnosis not present

## 2022-02-03 DIAGNOSIS — Z801 Family history of malignant neoplasm of trachea, bronchus and lung: Secondary | ICD-10-CM | POA: Diagnosis not present

## 2022-02-03 DIAGNOSIS — C189 Malignant neoplasm of colon, unspecified: Secondary | ICD-10-CM

## 2022-02-03 DIAGNOSIS — G629 Polyneuropathy, unspecified: Secondary | ICD-10-CM | POA: Insufficient documentation

## 2022-02-03 LAB — CBC WITH DIFFERENTIAL/PLATELET
Abs Immature Granulocytes: 0.02 10*3/uL (ref 0.00–0.07)
Basophils Absolute: 0.1 10*3/uL (ref 0.0–0.1)
Basophils Relative: 1 %
Eosinophils Absolute: 0.2 10*3/uL (ref 0.0–0.5)
Eosinophils Relative: 2 %
HCT: 41.6 % (ref 36.0–46.0)
Hemoglobin: 14.1 g/dL (ref 12.0–15.0)
Immature Granulocytes: 0 %
Lymphocytes Relative: 25 %
Lymphs Abs: 1.7 10*3/uL (ref 0.7–4.0)
MCH: 31.5 pg (ref 26.0–34.0)
MCHC: 33.9 g/dL (ref 30.0–36.0)
MCV: 93.1 fL (ref 80.0–100.0)
Monocytes Absolute: 0.6 10*3/uL (ref 0.1–1.0)
Monocytes Relative: 10 %
Neutro Abs: 4.1 10*3/uL (ref 1.7–7.7)
Neutrophils Relative %: 62 %
Platelets: 142 10*3/uL — ABNORMAL LOW (ref 150–400)
RBC: 4.47 MIL/uL (ref 3.87–5.11)
RDW: 15.8 % — ABNORMAL HIGH (ref 11.5–15.5)
WBC: 6.6 10*3/uL (ref 4.0–10.5)
nRBC: 0 % (ref 0.0–0.2)

## 2022-02-03 LAB — COMPREHENSIVE METABOLIC PANEL
ALT: 36 U/L (ref 0–44)
AST: 32 U/L (ref 15–41)
Albumin: 3.8 g/dL (ref 3.5–5.0)
Alkaline Phosphatase: 79 U/L (ref 38–126)
Anion gap: 9 (ref 5–15)
BUN: 18 mg/dL (ref 8–23)
CO2: 25 mmol/L (ref 22–32)
Calcium: 8.9 mg/dL (ref 8.9–10.3)
Chloride: 101 mmol/L (ref 98–111)
Creatinine, Ser: 0.96 mg/dL (ref 0.44–1.00)
GFR, Estimated: >60 mL/min (ref >60)
Glucose, Bld: 135 mg/dL — ABNORMAL HIGH (ref 70–99)
Potassium: 3.3 mmol/L — ABNORMAL LOW (ref 3.5–5.1)
Sodium: 135 mmol/L (ref 135–145)
Total Bilirubin: 1.1 mg/dL (ref 0.3–1.2)
Total Protein: 7.8 g/dL (ref 6.5–8.1)

## 2022-02-03 LAB — MAGNESIUM: Magnesium: 2.2 mg/dL (ref 1.7–2.4)

## 2022-02-03 MED ORDER — FLUOROURACIL CHEMO INJECTION 2.5 GM/50ML
320.0000 mg/m2 | Freq: Once | INTRAVENOUS | Status: AC
  Administered 2022-02-03: 550 mg via INTRAVENOUS
  Filled 2022-02-03: qty 11

## 2022-02-03 MED ORDER — SODIUM CHLORIDE 0.9 % IV SOLN
10.0000 mg | Freq: Once | INTRAVENOUS | Status: AC
  Administered 2022-02-03: 10 mg via INTRAVENOUS
  Filled 2022-02-03: qty 10

## 2022-02-03 MED ORDER — SODIUM CHLORIDE 0.9 % IV SOLN: Freq: Once | INTRAVENOUS | Status: AC

## 2022-02-03 MED ORDER — SODIUM CHLORIDE 0.9 % IV SOLN
1920.0000 mg/m2 | INTRAVENOUS | Status: DC
  Administered 2022-02-03: 3250 mg via INTRAVENOUS
  Filled 2022-02-03: qty 65

## 2022-02-03 MED ORDER — SODIUM CHLORIDE 0.9 % IV SOLN
400.0000 mg/m2 | Freq: Once | INTRAVENOUS | Status: AC
  Administered 2022-02-03: 680 mg via INTRAVENOUS
  Filled 2022-02-03: qty 34

## 2022-02-03 MED ORDER — PALONOSETRON HCL INJECTION 0.25 MG/5ML
0.2500 mg | Freq: Once | INTRAVENOUS | Status: AC
  Administered 2022-02-03: 0.25 mg via INTRAVENOUS
  Filled 2022-02-03: qty 5

## 2022-02-03 MED ORDER — SODIUM CHLORIDE 0.9 % IV SOLN
5.0000 mg/kg | Freq: Once | INTRAVENOUS | Status: AC
  Administered 2022-02-03: 400 mg via INTRAVENOUS
  Filled 2022-02-03: qty 16

## 2022-02-03 NOTE — Progress Notes (Signed)
Patient presents today for treatment and follow up visi with Dr. Delton Coombes. Labs reviewed and within parameters for treatment. Vital signs within parameters for treatment. Last urine on 07/25 negative for protein.

## 2022-02-03 NOTE — Patient Instructions (Signed)
[facility]  Discharge Instructions: Thank you for choosing [facility] to provide your oncology and hematology care.  If you have a lab appointment with the [facility], please come in thru the Main Entrance and check in at the main information desk.  Wear comfortable clothing and clothing appropriate for easy access to any Portacath or PICC line.   We strive to give you quality time with your provider. You may need to reschedule your appointment if you arrive late (15 or more minutes).  Arriving late affects you and other patients whose appointments are after yours.  Also, if you miss three or more appointments without notifying the office, you may be dismissed from the clinic at the provider's discretion.      For prescription refill requests, have your pharmacy contact our office and allow 72 hours for refills to be completed.    Today you received the following chemotherapy, leucovorin, 5FU, MVASI    To help prevent nausea and vomiting after your treatment, we encourage you to take your nausea medication as directed.  BELOW ARE SYMPTOMS THAT SHOULD BE REPORTED IMMEDIATELY: *FEVER GREATER THAN 100.4 F (38 C) OR HIGHER *CHILLS OR SWEATING *NAUSEA AND VOMITING THAT IS NOT CONTROLLED WITH YOUR NAUSEA MEDICATION *UNUSUAL SHORTNESS OF BREATH *UNUSUAL BRUISING OR BLEEDING *URINARY PROBLEMS (pain or burning when urinating, or frequent urination) *BOWEL PROBLEMS (unusual diarrhea, constipation, pain near the anus) TENDERNESS IN MOUTH AND THROAT WITH OR WITHOUT PRESENCE OF ULCERS (sore throat, sores in mouth, or a toothache) UNUSUAL RASH, SWELLING OR PAIN  UNUSUAL VAGINAL DISCHARGE OR ITCHING   Items with * indicate a potential emergency and should be followed up as soon as possible or go to the Emergency Department if any problems should occur.  Please show the CHEMOTHERAPY ALERT CARD or IMMUNOTHERAPY ALERT CARD at check-in to the Emergency Department  and triage nurse.  Should you have questions after your visit or need to cancel or reschedule your appointment, please contact [facility] [phone]  and follow the prompts.  Office hours are 8:00 a.m. to 4:30 p.m. Monday - Friday. Please note that voicemails left after 4:00 p.m. may not be returned until the following business day.  We are closed weekends and major holidays. You have access to a nurse at all times for urgent questions. Please call the main number to the clinic [phone] and follow the prompts.  For any non-urgent questions, you may also contact your provider using MyChart. We now offer e-Visits for anyone 9 and older to request care online for non-urgent symptoms. For details visit [email or web address].   Also download the MyChart app! Go to the app store, search "MyChart", open the app, select [facility], and log in with your MyChart username and password.  Masks are optional in the cancer centers. If you would like for your care team to wear a mask while they are taking care of you, please let them know. For doctor visits, patients may have with them one support person who is at least 62 years old. At this time, visitors are not allowed in the infusion area.

## 2022-02-03 NOTE — Patient Instructions (Signed)
[facility] Discharge Instructions   You were seen and examined today by Dr. Katragadda.  He reviewed the results of your lab work which are normal/stable.   He reviewed the results of your CT scan which is stable.   We will proceed with your treatment today.   Return as scheduled.    Thank you for choosing [facility] at [facility] to provide your oncology and hematology care.  To afford each patient quality time with our provider, please arrive at least 15 minutes before your scheduled appointment time.   If you have a lab appointment with the Cancer Center please come in thru the Main Entrance and check in at the main information desk.  You need to re-schedule your appointment should you arrive 10 or more minutes late.  We strive to give you quality time with our providers, and arriving late affects you and other patients whose appointments are after yours.  Also, if you no show three or more times for appointments you may be dismissed from the clinic at the providers discretion.     Again, thank you for choosing [facility].  Our hope is that these requests will decrease the amount of time that you wait before being seen by our physicians.       _____________________________________________________________  Should you have questions after your visit to [facility], please contact our office at [phone] and follow the prompts.  Our office hours are 8:00 a.m. and 4:30 p.m. Monday - Friday.  Please note that voicemails left after 4:00 p.m. may not be returned until the following business day.  We are closed weekends and major holidays.  You do have access to a nurse 24-7, just call the main number to the clinic [phone] and do not press any options, hold on the line and a nurse will answer the phone.    For prescription refill requests, have your pharmacy contact our office and allow 72 hours.     Due to Covid, you will need to wear a mask upon entering the hospital. If you do not have a mask, a mask will be given to you at the Main Entrance upon arrival. For doctor visits, patients may have 1 support person age 18 or older with them. For treatment visits, patients can not have anyone with them due to social distancing guidelines and our immunocompromised population.      

## 2022-02-03 NOTE — Progress Notes (Signed)
[place], [place] [address]   CLINIC:  Medical Oncology/Hematology  PCP:  Patient, No Pcp Per None None   REASON FOR VISIT:  Follow-up for metastatic colon cancer to liver  PRIOR THERAPY: Sigmoid colectomy and end colostomy on 04/29/2020  NGS Results: Foundation 1 KRAS wildtype, MS--stable, TMB 4 Muts/Mb  CURRENT THERAPY: Maintenance 5-FU and bevacizumab  BRIEF ONCOLOGIC HISTORY:  Oncology History  Metastatic colon cancer to liver ([facility])  05/16/2020 Initial Diagnosis   Metastatic colon cancer to liver ([facility])   06/03/2020 Genetic Testing   Foundation One:     06/04/2020 -  Chemotherapy   Patient is on Treatment Plan : COLORECTAL FOLFOX + Bevacizumab q14d       CANCER STAGING:  Cancer Staging  No matching staging information was found for the patient.  INTERVAL HISTORY:  [name], a 61 y.o. female, returns to clinic for toxicity assessment and maintenance therapy with 5-FU and bevacizumab.  She had CT scan done few days ago for response evaluation.  She denies any major GI symptoms.  She has mouth sores on the corners of the mouth which she will during the second week.   REVIEW OF SYSTEMS:  Review of Systems  Constitutional:  Negative for appetite change and fatigue.  HENT:   Positive for mouth sores (improved). Negative for nosebleeds.   Gastrointestinal:  Negative for abdominal pain and diarrhea.  Hematological:  Does not bruise/bleed easily.  All other systems reviewed and are negative.   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 Delton Coombes   04-29-2020 emerge[facility] surgery for perforated colon s/p sigmoid colectomy w/ colostomy; dx  Stage IV colon cancer mets to liver   Anxiety    Depression    Hypertension    followed by dr Glo Herring and oncology  (05-27-2020 per pt does not have pcp yet)   IDA (iron deficiency  anemia)    Pulmonary nodule, right    Past Surgical History:  Procedure Laterality Date   ABDOMINAL HYSTERECTOMY  03-31-2016   @[facility]    W/  BILATERAL SALPINOOPHORECTOMY    APPLICATION OF WOUND VAC N/A 04/29/2020   Procedure: APPLICATION OF WOUND VAC;  Surgeon: Mickeal Skinner, MD;  Location: [facility];  Service: General;  Laterality: N/A;   ENDOMETRIAL ABLATION     LAPAROTOMY N/A 04/29/2020   Procedure: EXPLORATORY LAPAROTOMY;  Surgeon: Mickeal Skinner, MD;  Location: [facility];  Service: General;  Laterality: N/A;   PARTIAL COLECTOMY N/A 04/29/2020   Procedure: PARTIAL COLECTOMY WITH END COLOSTOMY;  Surgeon: Kieth Brightly Arta Bruce, MD;  Location: [facility];  Service: General;  Laterality: N/A;   PORTACATH PLACEMENT Right 05/28/2020   Procedure: INSERTION PORT-A-CATH;  Surgeon: Kinsinger, Arta Bruce, MD;  Location: [facility];  Service: General;  Laterality: Right;   SALPINGOOPHORECTOMY Left 03/31/2016   Procedure: LEFT SALPINGO OOPHORECTOMY WITH FROZEN SECTION,  ABDOMINAL HYSTERECTOMY WITH BILATERAL SALPINGO-OOPHORECTOMY;  Surgeon: Jonnie Kind, MD;  Location: [facility];  Service: Gynecology;  Laterality: Left;    SOCIAL HISTORY:  Social History   Socioeconomic History   Marital status: Divorced    Spouse name: Not on file   Number of children: Not on file   Years of education: Not on file   Highest education level: Not on file  Occupational History   Not on file  Tobacco Use   Smoking status: Former  Packs/day: 0.50    Years: 5.00    Total pack years: 2.50    Types: Cigarettes    Quit date: 03/28/2015    Years since quitting: 6.8   Smokeless tobacco: Never  Vaping Use   Vaping Use: Never used  Substance and Sexual Activity   Alcohol use: No   Drug use: Never   Sexual activity: Yes    Partners: Male    Birth control/protection: Surgical  Other Topics Concern   Not on file  Social History Narrative   Not on file   Social Determinants of Health    Financial Resource Strain: Low Risk  (11/01/2019)   Overall Financial Resource Strain (CARDIA)    Difficulty of Paying Living Expenses: Not very hard  Food Insecurity: No Food Insecurity (11/01/2019)   Hunger Vital Sign    Worried About Running Out of Food in the Last Year: Never true    Ran Out of Food in the Last Year: Never true  Transportation Needs: No Transportation Needs (11/01/2019)   PRAPARE - Hydrologist (Medical): No    Lack of Transportation (Non-Medical): No  Physical Activity: Insufficiently Active (11/01/2019)   Exercise Vital Sign    Days of Exercise per Week: 2 days    Minutes of Exercise per Session: 30 min  Stress: Stress Concern Present (11/01/2019)   [place]    Feeling of Stress : To some extent  Social Connections: Moderately Integrated (11/01/2019)   Social Connection and Isolation Panel [NHANES]    Frequency of Communication with Friends and Family: More than three times a week    Frequency of Social Gatherings with Friends and Family: Once a week    Attends Religious Services: More than 4 times per year    Active Member of [employer] or Organizations: No    Attends Archivist Meetings: Never    Marital Status: Living with partner  Intimate Partner Violence: Not At Risk (11/01/2019)   Humiliation, Afraid, Rape, and Kick questionnaire    Fear of Current or Ex-Partner: No    Emotionally Abused: No    Physically Abused: No    Sexually Abused: No    FAMILY HISTORY:  Family History  Problem Relation Age of Onset   Hypertension Mother    Diabetes Mother    Cirrhosis Father     CURRENT MEDICATIONS:  Current Outpatient Medications  Medication Sig Dispense Refill   amLODipine (NORVASC) 5 MG tablet Take 1 tablet (5 mg total) by mouth daily. 90 tablet 1   BEVACIZUMAB IV Inject 5 mg/kg into the vein every 14 (fourteen) days.     clonazePAM (KLONOPIN) 1 MG tablet Take  1 tablet (1 mg total) by mouth 2 (two) times daily. 60 tablet 3   fluorouracil CALGB 21115 in sodium chloride 0.9 % 150 mL Inject 2,400 mg/m2 into the vein over 48 hr.     gabapentin (NEURONTIN) 100 MG capsule TAKE 1 CAPSULE (100 MG TOTAL) BY MOUTH THREE TIMES DAILY. 90 capsule 3   LEUCOVORIN CALCIUM IV Inject 400 mg/m2 into the vein every 21 ( twenty-one) days.     lidocaine (XYLOCAINE) 2 % solution Use as directed 15 mLs in the mouth or throat every 6 (six) hours as needed for mouth pain. Swish and spit/swallow every six hours as needed for mouth pain 480 mL 3   lidocaine-prilocaine (EMLA) cream Apply small amount to port a cath site and cover with  plastic wrap 1 hour prior to chemotherapy appointments 30 g 3   LORazepam (ATIVAN) 1 MG tablet Take 1 tablet (1 mg total) by mouth every 6 (six) hours as needed. for anxiety 120 tablet 1   nystatin (MYCOSTATIN) 100000 UNIT/ML suspension Take by mouth.     PARoxetine (PAXIL) 40 MG tablet Take 1 tablet (40 mg total) by mouth every morning. 90 tablet 3   polyethylene glycol (MIRALAX / GLYCOLAX) packet Take 17 g by mouth every other day.     potassium chloride (KLOR-CON M) 10 MEQ tablet Take 2 tablets (20 mEq total) by mouth daily. 90 tablet 6   prochlorperazine (COMPAZINE) 10 MG tablet Take 1 tablet (10 mg total) by mouth every 6 (six) hours as needed (Nausea or vomiting). 30 tablet 1   triamterene-hydrochlorothiazide (MAXZIDE-25) 37.5-25 MG tablet TAKE 1 TABLET BY MOUTH EVERY DAY 90 tablet 1   No current facility-administered medications for this visit.    ALLERGIES:  No Known Allergies  PHYSICAL EXAM:  Performance status (ECOG): 1 - Symptomatic but completely ambulatory  There were no vitals filed for this visit.  Wt Readings from Last 3 Encounters:  01/20/22 174 lb 2.6 oz (79 kg)  12/23/21 171 lb 3.2 oz (77.7 kg)  12/09/21 170 lb 12.8 oz (77.5 kg)   Physical Exam Vitals reviewed.  Constitutional:      Appearance: Normal appearance.   Cardiovascular:     Rate and Rhythm: Normal rate and regular rhythm.     Pulses: Normal pulses.     Heart sounds: Normal heart sounds.  Pulmonary:     Effort: Pulmonary effort is normal.     Breath sounds: Normal breath sounds.  Abdominal:     Palpations: Abdomen is soft. There is no mass.     Tenderness: There is no abdominal tenderness.  Musculoskeletal:     Right lower leg: No edema.     Left lower leg: No edema.  Neurological:     General: No focal deficit present.     Mental Status: She is alert and oriented to person, place, and time.  Psychiatric:        Mood and Affect: Mood normal.        Behavior: Behavior normal.     LABORATORY DATA:  I have reviewed the labs as listed.     Latest Ref Rng & Units 01/20/2022    7:54 AM 01/06/2022    9:43 AM 12/23/2021    7:43 AM  CBC  WBC 4.0 - 10.5 K/uL 5.1  7.4  5.0   Hemoglobin 12.0 - 15.0 g/dL 13.8  14.1  13.5   Hematocrit 36.0 - 46.0 % 40.9  41.1  39.9   Platelets 150 - 400 K/uL 123  134  143       Latest Ref Rng & Units 01/20/2022    7:54 AM 01/06/2022    9:43 AM 12/23/2021    7:43 AM  CMP  Glucose 70 - 99 mg/dL 131  122  136   BUN 8 - 23 mg/dL 16  12  13    Creatinine 0.44 - 1.00 mg/dL 1.05  0.91  0.90   Sodium 135 - 145 mmol/L 135  131  135   Potassium 3.5 - 5.1 mmol/L 3.2  3.1  3.1   Chloride 98 - 111 mmol/L 102  98  99   CO2 22 - 32 mmol/L 25  28  26    Calcium 8.9 - 10.3 mg/dL 9.0  8.6  8.8  Total Protein 6.5 - 8.1 g/dL 7.3  7.5  7.5   Total Bilirubin 0.3 - 1.2 mg/dL 0.7  1.0  0.9   Alkaline Phos 38 - 126 U/L 74  81  77   AST 15 - 41 U/L 31  32  27   ALT 0 - 44 U/L 31  24  25      DIAGNOSTIC IMAGING:  I have independently reviewed the scans and discussed with the patient. CT CHEST ABDOMEN PELVIS W CONTRAST  Result Date: 01/26/2022 CLINICAL DATA:  61 year old female with history of metastatic colon cancer, status post surgical resection, currently undergoing chemotherapy. Follow-up study. * Tracking Code: BO *  EXAM: CT CHEST, ABDOMEN, AND PELVIS WITH CONTRAST TECHNIQUE: Multidetector CT imaging of the chest, abdomen and pelvis was performed following the standard protocol during bolus administration of intravenous contrast. RADIATION DOSE REDUCTION: This exam was performed according to the departmental dose-optimization program which includes automated exposure control, adjustment of the mA and/or kV according to patient size and/or use of iterative reconstruction technique. CONTRAST:  184m OMNIPAQUE IOHEXOL 300 MG/ML  SOLN COMPARISON:  Multiple priors, most recently CT of the chest, abdomen and pelvis 10/21/2021. FINDINGS: CT CHEST FINDINGS Cardiovascular: Heart size is normal. There is no significant pericardial fluid, thickening or pericardial calcification. Atherosclerosis in the thoracic aorta. No definite coronary artery calcifications. Right internal jugular single-lumen Port-A-Cath with tip terminating in the distal superior vena cava. Mediastinum/Nodes: No pathologically enlarged mediastinal or hilar lymph nodes. Esophagus is unremarkable in appearance. No axillary lymphadenopathy. Lungs/Pleura: Multiple tiny pulmonary nodules are again noted in the lungs bilaterally, stable in size and number compared to the prior study, strongly favored to be benign, largest of which is an ovoid shaped 9 x 3 mm (mean diameter 5 mm) subpleural nodule associated with the left major fissure (axial image 73 of series 4). No other suspicious appearing pulmonary nodules or masses are noted. No acute consolidative airspace disease. No pleural effusions. Musculoskeletal: There are no aggressive appearing lytic or blastic lesions noted in the visualized portions of the skeleton. CT ABDOMEN PELVIS FINDINGS Hepatobiliary: Several hypovascular hepatic lesions are again noted, compatible with metastatic disease to the liver, very similar in size and number to the prior examination, largest of which is in the right lobe between segments 7  and 8 (axial image 57 of series 2) measuring 2.4 x 2.2 cm (previously 2.2 x 1.7 cm). No definite new hepatic lesions are confidently identified on today's examination. No intra or extrahepatic biliary ductal dilatation. Gallbladder is unremarkable in appearance. Pancreas: No pancreatic mass. No pancreatic ductal dilatation. No pancreatic or peripancreatic fluid collections or inflammatory changes. Spleen: Unremarkable. Adrenals/Urinary Tract: Bilateral kidneys and adrenal glands are normal in appearance. No hydroureteronephrosis. Urinary bladder is normal in appearance. Stomach/Bowel: The appearance of the stomach is normal. There is no pathologic dilatation of small bowel or colon. Status post sigmoidectomy with left lower quadrant colostomy. The appendix is not confidently identified and may be surgically absent. Regardless, there are no inflammatory changes noted adjacent to the cecum to suggest the presence of an acute appendicitis at this time. Vascular/Lymphatic: Aortic atherosclerosis, without evidence of aneurysm or dissection in the abdominal or pelvic vasculature. No lymphadenopathy noted in the abdomen or pelvis. Reproductive: Status post hysterectomy. Ovaries are not confidently identified may be surgically absent or atrophic. Other: No significant volume of ascites.  No pneumoperitoneum. Musculoskeletal: There are no aggressive appearing lytic or blastic lesions noted in the visualized portions of the skeleton. IMPRESSION: 1.  Overall, today's study appears essentially stable compared to the prior examination, with unchanged number of numerous hepatic lesions which are within measurement error stable compared to the prior study. No new hepatic lesions are otherwise noted. No other extrahepatic metastatic lesions noted elsewhere in the chest, abdomen or pelvis. 2. Small pulmonary nodules, stable compared to the prior examination, strongly favored to be benign, as above. Continued attention on follow-up  studies is recommended. 3. Aortic atherosclerosis. 4. Postoperative changes and additional incidental findings, as above. Electronically Signed   By: Vinnie Langton M.D.   On: 01/26/2022 09:24     ASSESSMENT:  1.  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 -CT chest on 05/02/2020 with no evidence of pulmonary metastatic disease. -CTAP on 05/07/2020 with multiple lesions throughout the liver, largest in the right lobe measuring 3.9 cm, lateral segment left lobe measuring 2.6 cm and inferior right lobe confluent lesion 4.2 x 3.7 cm.  No lymphadenopathy. -CEA on 05/02/2020-60.2. -FOLFOX and bevacizumab started on 06/04/2020. 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   2.  Iron deficiency anemia: -Feraheme on 04/30/2020 and 05/06/2020.   3.  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 -Smoked for 3 to 4 years and quit. -Mother had cancer, type unknown.  Maternal uncle had lung cancer.  Maternal aunt had lung cancer.   PLAN:  1.  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 - CT CAP (01/26/2022): Stable exam in the number of the liver lesions.  Stable small benign pulmonary nodules. - Last CEA was 1.7.  Labs today shows mild thrombocytopenia with normal LFTs. - Proceed maintenance 5-FU and bevacizumab every 2 weeks.  RTC 8 weeks for follow-up.   2.  Hypertension: - Continue triamterene/HCTZ and Norvasc 5 mg daily.   3.  Severe hypokalemia: - Continue potassium 10 mEq 3 tablets daily.  Potassium is 3.3.   4.  Peripheral neuropathy: - Continue gabapentin 100 mg in the mornings.   5.  Anxiety: -  Continue Paxil and Klonopin 1 mg twice daily.  6.  Mucositis: - Continue Vaseline and lidocaine gel as needed.   Orders placed this encounter:  No orders of the defined types were placed in this encounter.    Derek Jack, MD [place] [phone]   I, Thana Ates, am acting as a scribe for Dr. Derek Jack.  I, Derek Jack MD, have reviewed the above documentation for accuracy and completeness, and I agree with the above.

## 2022-02-03 NOTE — Progress Notes (Signed)
Treatment given per orders. Patient tolerated it well without problems. Vitals stable and discharged home from clinic ambulatory. Follow up as scheduled.  

## 2022-02-04 ENCOUNTER — Other Ambulatory Visit: Payer: Self-pay

## 2022-02-05 ENCOUNTER — Inpatient Hospital Stay: Payer: Medicaid Other

## 2022-02-05 VITALS — BP 144/85 | HR 72 | Temp 97.8°F | Resp 18

## 2022-02-05 DIAGNOSIS — Z5111 Encounter for antineoplastic chemotherapy: Secondary | ICD-10-CM | POA: Diagnosis not present

## 2022-02-05 DIAGNOSIS — C189 Malignant neoplasm of colon, unspecified: Secondary | ICD-10-CM

## 2022-02-05 MED ORDER — SODIUM CHLORIDE 0.9% FLUSH
10.0000 mL | INTRAVENOUS | Status: DC | PRN
  Administered 2022-02-05: 10 mL

## 2022-02-05 MED ORDER — HEPARIN SOD (PORK) LOCK FLUSH 100 UNIT/ML IV SOLN
500.0000 [IU] | Freq: Once | INTRAVENOUS | Status: AC | PRN
  Administered 2022-02-05: 500 [IU]

## 2022-02-05 NOTE — Patient Instructions (Signed)
[facility]  Discharge Instructions: Thank you for choosing [facility] to provide your oncology and hematology care.  If you have a lab appointment with the [facility], please come in thru the Main Entrance and check in at the main information desk.  Wear comfortable clothing and clothing appropriate for easy access to any Portacath or PICC line.   We strive to give you quality time with your provider. You may need to reschedule your appointment if you arrive late (15 or more minutes).  Arriving late affects you and other patients whose appointments are after yours.  Also, if you miss three or more appointments without notifying the office, you may be dismissed from the clinic at the provider's discretion.      For prescription refill requests, have your pharmacy contact our office and allow 72 hours for refills to be completed.    Today you received chemotherapy pump disconnection.     BELOW ARE SYMPTOMS THAT SHOULD BE REPORTED IMMEDIATELY: *FEVER GREATER THAN 100.4 F (38 C) OR HIGHER *CHILLS OR SWEATING *NAUSEA AND VOMITING THAT IS NOT CONTROLLED WITH YOUR NAUSEA MEDICATION *UNUSUAL SHORTNESS OF BREATH *UNUSUAL BRUISING OR BLEEDING *URINARY PROBLEMS (pain or burning when urinating, or frequent urination) *BOWEL PROBLEMS (unusual diarrhea, constipation, pain near the anus) TENDERNESS IN MOUTH AND THROAT WITH OR WITHOUT PRESENCE OF ULCERS (sore throat, sores in mouth, or a toothache) UNUSUAL RASH, SWELLING OR PAIN  UNUSUAL VAGINAL DISCHARGE OR ITCHING   Items with * indicate a potential emergency and should be followed up as soon as possible or go to the Emergency Department if any problems should occur.  Please show the CHEMOTHERAPY ALERT CARD or IMMUNOTHERAPY ALERT CARD at check-in to the Emergency Department and triage nurse.  Should you have questions after your visit or need to cancel or reschedule your appointment, please contact  [facility] [phone]  and follow the prompts.  Office hours are 8:00 a.m. to 4:30 p.m. Monday - Friday. Please note that voicemails left after 4:00 p.m. may not be returned until the following business day.  We are closed weekends and major holidays. You have access to a nurse at all times for urgent questions. Please call the main number to the clinic [phone] and follow the prompts.  For any non-urgent questions, you may also contact your provider using MyChart. We now offer e-Visits for anyone 33 and older to request care online for non-urgent symptoms. For details visit [email or web address].   Also download the MyChart app! Go to the app store, search "MyChart", open the app, select [facility], and log in with your MyChart username and password.  Masks are optional in the cancer centers. If you would like for your care team to wear a mask while they are taking care of you, please let them know. For doctor visits, patients may have with them one support person who is at least 62 years old. At this time, visitors are not allowed in the infusion area.

## 2022-02-05 NOTE — Progress Notes (Signed)
Pt presents for 5FU chemotherapy pump disconnection per provider's order. Vital signs stable. Port flushed easily without difficulty with 10 mL of NS and 5 mL of heparin. Good blood return noted and needle removed intact. No bruising or swelling noted at the site.    Discharged from clinic ambulatory in stable condition. Alert and oriented x 3. F/U with [facility] as scheduled.

## 2022-02-23 ENCOUNTER — Inpatient Hospital Stay: Payer: Medicaid Other

## 2022-02-23 DIAGNOSIS — C189 Malignant neoplasm of colon, unspecified: Secondary | ICD-10-CM

## 2022-02-23 DIAGNOSIS — Z5111 Encounter for antineoplastic chemotherapy: Secondary | ICD-10-CM | POA: Diagnosis not present

## 2022-02-23 LAB — CBC WITH DIFFERENTIAL/PLATELET
Abs Immature Granulocytes: 0.04 10*3/uL (ref 0.00–0.07)
Basophils Absolute: 0.1 10*3/uL (ref 0.0–0.1)
Basophils Relative: 1 %
Eosinophils Absolute: 0.3 10*3/uL (ref 0.0–0.5)
Eosinophils Relative: 5 %
HCT: 38.8 % (ref 36.0–46.0)
Hemoglobin: 12.9 g/dL (ref 12.0–15.0)
Immature Granulocytes: 1 %
Lymphocytes Relative: 35 %
Lymphs Abs: 1.7 10*3/uL (ref 0.7–4.0)
MCH: 31.8 pg (ref 26.0–34.0)
MCHC: 33.2 g/dL (ref 30.0–36.0)
MCV: 95.6 fL (ref 80.0–100.0)
Monocytes Absolute: 0.7 10*3/uL (ref 0.1–1.0)
Monocytes Relative: 14 %
Neutro Abs: 2.2 10*3/uL (ref 1.7–7.7)
Neutrophils Relative %: 44 %
Platelets: 144 10*3/uL — ABNORMAL LOW (ref 150–400)
RBC: 4.06 MIL/uL (ref 3.87–5.11)
RDW: 15.7 % — ABNORMAL HIGH (ref 11.5–15.5)
WBC: 5 10*3/uL (ref 4.0–10.5)
nRBC: 0 % (ref 0.0–0.2)

## 2022-02-23 LAB — MAGNESIUM: Magnesium: 2.3 mg/dL (ref 1.7–2.4)

## 2022-02-23 LAB — COMPREHENSIVE METABOLIC PANEL
ALT: 28 U/L (ref 0–44)
AST: 30 U/L (ref 15–41)
Albumin: 3.4 g/dL — ABNORMAL LOW (ref 3.5–5.0)
Alkaline Phosphatase: 70 U/L (ref 38–126)
Anion gap: 7 (ref 5–15)
BUN: 13 mg/dL (ref 8–23)
CO2: 25 mmol/L (ref 22–32)
Calcium: 8.2 mg/dL — ABNORMAL LOW (ref 8.9–10.3)
Chloride: 105 mmol/L (ref 98–111)
Creatinine, Ser: 0.89 mg/dL (ref 0.44–1.00)
GFR, Estimated: >60 mL/min (ref >60)
Glucose, Bld: 106 mg/dL — ABNORMAL HIGH (ref 70–99)
Potassium: 3.5 mmol/L (ref 3.5–5.1)
Sodium: 137 mmol/L (ref 135–145)
Total Bilirubin: 0.7 mg/dL (ref 0.3–1.2)
Total Protein: 6.9 g/dL (ref 6.5–8.1)

## 2022-02-23 MED ORDER — SODIUM CHLORIDE 0.9 % IV SOLN
400.0000 mg/m2 | Freq: Once | INTRAVENOUS | Status: AC
  Administered 2022-02-23: 680 mg via INTRAVENOUS
  Filled 2022-02-23: qty 34

## 2022-02-23 MED ORDER — SODIUM CHLORIDE 0.9 % IV SOLN: Freq: Once | INTRAVENOUS | Status: AC

## 2022-02-23 MED ORDER — FLUOROURACIL CHEMO INJECTION 2.5 GM/50ML
320.0000 mg/m2 | Freq: Once | INTRAVENOUS | Status: AC
  Administered 2022-02-23: 550 mg via INTRAVENOUS
  Filled 2022-02-23: qty 11

## 2022-02-23 MED ORDER — SODIUM CHLORIDE 0.9 % IV SOLN
1920.0000 mg/m2 | INTRAVENOUS | Status: DC
  Administered 2022-02-23: 3250 mg via INTRAVENOUS
  Filled 2022-02-23: qty 65

## 2022-02-23 MED ORDER — PALONOSETRON HCL INJECTION 0.25 MG/5ML
0.2500 mg | Freq: Once | INTRAVENOUS | Status: AC
  Administered 2022-02-23: 0.25 mg via INTRAVENOUS
  Filled 2022-02-23: qty 5

## 2022-02-23 MED ORDER — SODIUM CHLORIDE 0.9 % IV SOLN
10.0000 mg | Freq: Once | INTRAVENOUS | Status: AC
  Administered 2022-02-23: 10 mg via INTRAVENOUS
  Filled 2022-02-23: qty 10

## 2022-02-23 MED ORDER — SODIUM CHLORIDE 0.9 % IV SOLN
5.0000 mg/kg | Freq: Once | INTRAVENOUS | Status: AC
  Administered 2022-02-23: 400 mg via INTRAVENOUS
  Filled 2022-02-23: qty 16

## 2022-02-23 NOTE — Progress Notes (Signed)
Patient presents today for treatment. Vital signs within parameters for treatment. Labs pending. Patient has no complaints of any changes since her last treatment. MAR reviewed and updated.    Labs within parameters for treatment today. Pre-treatment checklist complete.  Treatment given today per MD orders. Tolerated infusion without adverse affects. Vital signs stable. No complaints at this time. Discharged from clinic ambulatory in stable condition. 5 FU pump infusing and verified with patient. RUN noted on screen. Alert and oriented x 3. F/U with [facility] as scheduled.

## 2022-02-25 ENCOUNTER — Inpatient Hospital Stay: Payer: Medicaid Other

## 2022-02-25 VITALS — BP 142/80 | HR 70 | Temp 99.0°F | Resp 18

## 2022-02-25 DIAGNOSIS — Z5111 Encounter for antineoplastic chemotherapy: Secondary | ICD-10-CM | POA: Diagnosis not present

## 2022-02-25 DIAGNOSIS — C189 Malignant neoplasm of colon, unspecified: Secondary | ICD-10-CM

## 2022-02-25 MED ORDER — SODIUM CHLORIDE 0.9% FLUSH
10.0000 mL | INTRAVENOUS | Status: DC | PRN
  Administered 2022-02-25: 10 mL

## 2022-02-25 MED ORDER — HEPARIN SOD (PORK) LOCK FLUSH 100 UNIT/ML IV SOLN
500.0000 [IU] | Freq: Once | INTRAVENOUS | Status: AC | PRN
  Administered 2022-02-25: 500 [IU]

## 2022-02-25 NOTE — Patient Instructions (Signed)
[facility]  Discharge Instructions: Thank you for choosing [facility] to provide your oncology and hematology care.  If you have a lab appointment with the [facility], please come in thru the Main Entrance and check in at the main information desk.  Wear comfortable clothing and clothing appropriate for easy access to any Portacath or PICC line.   We strive to give you quality time with your provider. You may need to reschedule your appointment if you arrive late (15 or more minutes).  Arriving late affects you and other patients whose appointments are after yours.  Also, if you miss three or more appointments without notifying the office, you may be dismissed from the clinic at the provider's discretion.      For prescription refill requests, have your pharmacy contact our office and allow 72 hours for refills to be completed.    Today you received the following chemo pump disconnected with no alarms noted.    To help prevent nausea and vomiting after your treatment, we encourage you to take your nausea medication as directed.  BELOW ARE SYMPTOMS THAT SHOULD BE REPORTED IMMEDIATELY: *FEVER GREATER THAN 100.4 F (38 C) OR HIGHER *CHILLS OR SWEATING *NAUSEA AND VOMITING THAT IS NOT CONTROLLED WITH YOUR NAUSEA MEDICATION *UNUSUAL SHORTNESS OF BREATH *UNUSUAL BRUISING OR BLEEDING *URINARY PROBLEMS (pain or burning when urinating, or frequent urination) *BOWEL PROBLEMS (unusual diarrhea, constipation, pain near the anus) TENDERNESS IN MOUTH AND THROAT WITH OR WITHOUT PRESENCE OF ULCERS (sore throat, sores in mouth, or a toothache) UNUSUAL RASH, SWELLING OR PAIN  UNUSUAL VAGINAL DISCHARGE OR ITCHING   Items with * indicate a potential emergency and should be followed up as soon as possible or go to the Emergency Department if any problems should occur.  Please show the CHEMOTHERAPY ALERT CARD or IMMUNOTHERAPY ALERT CARD at check-in to the Emergency  Department and triage nurse.  Should you have questions after your visit or need to cancel or reschedule your appointment, please contact [facility] [phone]  and follow the prompts.  Office hours are 8:00 a.m. to 4:30 p.m. Monday - Friday. Please note that voicemails left after 4:00 p.m. may not be returned until the following business day.  We are closed weekends and major holidays. You have access to a nurse at all times for urgent questions. Please call the main number to the clinic [phone] and follow the prompts.  For any non-urgent questions, you may also contact your provider using MyChart. We now offer e-Visits for anyone 48 and older to request care online for non-urgent symptoms. For details visit [email or web address].   Also download the MyChart app! Go to the app store, search "MyChart", open the app, select [facility], and log in with your MyChart username and password.  Masks are optional in the cancer centers. If you would like for your care team to wear a mask while they are taking care of you, please let them know. You may have one support person who is at least 62 years old accompany you for your appointments.

## 2022-02-25 NOTE — Progress Notes (Signed)
Patient presents today for pump d/c.  Port flushed with good blood return noted. No bruising or swelling at site. Bandaid applied and patient discharged in satisfactory condition. VVS stable with no signs or symptoms of distressed noted.  

## 2022-03-02 ENCOUNTER — Other Ambulatory Visit: Payer: Self-pay | Admitting: Hematology

## 2022-03-02 DIAGNOSIS — C189 Malignant neoplasm of colon, unspecified: Secondary | ICD-10-CM

## 2022-03-07 ENCOUNTER — Other Ambulatory Visit: Payer: Self-pay

## 2022-03-09 ENCOUNTER — Inpatient Hospital Stay: Payer: Medicaid Other

## 2022-03-09 ENCOUNTER — Inpatient Hospital Stay: Payer: Medicaid Other | Attending: Hematology

## 2022-03-09 VITALS — BP 144/88 | HR 73 | Temp 97.3°F | Resp 18

## 2022-03-09 DIAGNOSIS — C787 Secondary malignant neoplasm of liver and intrahepatic bile duct: Secondary | ICD-10-CM | POA: Insufficient documentation

## 2022-03-09 DIAGNOSIS — C189 Malignant neoplasm of colon, unspecified: Secondary | ICD-10-CM | POA: Diagnosis not present

## 2022-03-09 DIAGNOSIS — Z5111 Encounter for antineoplastic chemotherapy: Secondary | ICD-10-CM | POA: Diagnosis present

## 2022-03-09 DIAGNOSIS — Z5189 Encounter for other specified aftercare: Secondary | ICD-10-CM | POA: Insufficient documentation

## 2022-03-09 LAB — CBC WITH DIFFERENTIAL/PLATELET
Abs Immature Granulocytes: 0.02 10*3/uL (ref 0.00–0.07)
Basophils Absolute: 0.1 10*3/uL (ref 0.0–0.1)
Basophils Relative: 1 %
Eosinophils Absolute: 0.3 10*3/uL (ref 0.0–0.5)
Eosinophils Relative: 5 %
HCT: 39.6 % (ref 36.0–46.0)
Hemoglobin: 13.2 g/dL (ref 12.0–15.0)
Immature Granulocytes: 0 %
Lymphocytes Relative: 27 %
Lymphs Abs: 1.5 10*3/uL (ref 0.7–4.0)
MCH: 32 pg (ref 26.0–34.0)
MCHC: 33.3 g/dL (ref 30.0–36.0)
MCV: 96.1 fL (ref 80.0–100.0)
Monocytes Absolute: 0.5 10*3/uL (ref 0.1–1.0)
Monocytes Relative: 9 %
Neutro Abs: 3.1 10*3/uL (ref 1.7–7.7)
Neutrophils Relative %: 58 %
Platelets: 136 10*3/uL — ABNORMAL LOW (ref 150–400)
RBC: 4.12 MIL/uL (ref 3.87–5.11)
RDW: 15.8 % — ABNORMAL HIGH (ref 11.5–15.5)
WBC: 5.4 10*3/uL (ref 4.0–10.5)
nRBC: 0 % (ref 0.0–0.2)

## 2022-03-09 LAB — COMPREHENSIVE METABOLIC PANEL
ALT: 29 U/L (ref 0–44)
AST: 30 U/L (ref 15–41)
Albumin: 3.6 g/dL (ref 3.5–5.0)
Alkaline Phosphatase: 76 U/L (ref 38–126)
Anion gap: 9 (ref 5–15)
BUN: 17 mg/dL (ref 8–23)
CO2: 26 mmol/L (ref 22–32)
Calcium: 8.6 mg/dL — ABNORMAL LOW (ref 8.9–10.3)
Chloride: 101 mmol/L (ref 98–111)
Creatinine, Ser: 1 mg/dL (ref 0.44–1.00)
GFR, Estimated: >60 mL/min (ref >60)
Glucose, Bld: 155 mg/dL — ABNORMAL HIGH (ref 70–99)
Potassium: 3.6 mmol/L (ref 3.5–5.1)
Sodium: 136 mmol/L (ref 135–145)
Total Bilirubin: 0.6 mg/dL (ref 0.3–1.2)
Total Protein: 7.1 g/dL (ref 6.5–8.1)

## 2022-03-09 LAB — MAGNESIUM: Magnesium: 2.1 mg/dL (ref 1.7–2.4)

## 2022-03-09 LAB — URINALYSIS, DIPSTICK ONLY
Bilirubin Urine: NEGATIVE
Glucose, UA: NEGATIVE mg/dL
Hgb urine dipstick: NEGATIVE
Ketones, ur: NEGATIVE mg/dL
Leukocytes,Ua: NEGATIVE
Nitrite: NEGATIVE
Protein, ur: 30 mg/dL — AB
Specific Gravity, Urine: 1.019 (ref 1.005–1.030)
pH: 5 (ref 5.0–8.0)

## 2022-03-09 MED ORDER — PALONOSETRON HCL INJECTION 0.25 MG/5ML
0.2500 mg | Freq: Once | INTRAVENOUS | Status: AC
  Administered 2022-03-09: 0.25 mg via INTRAVENOUS
  Filled 2022-03-09: qty 5

## 2022-03-09 MED ORDER — SODIUM CHLORIDE 0.9 % IV SOLN
5.0000 mg/kg | Freq: Once | INTRAVENOUS | Status: AC
  Administered 2022-03-09: 400 mg via INTRAVENOUS
  Filled 2022-03-09: qty 16

## 2022-03-09 MED ORDER — SODIUM CHLORIDE 0.9 % IV SOLN
3250.0000 mg | INTRAVENOUS | Status: DC
  Administered 2022-03-09: 3250 mg via INTRAVENOUS
  Filled 2022-03-09: qty 65

## 2022-03-09 MED ORDER — SODIUM CHLORIDE 0.9 % IV SOLN
10.0000 mg | Freq: Once | INTRAVENOUS | Status: AC
  Administered 2022-03-09: 10 mg via INTRAVENOUS
  Filled 2022-03-09: qty 1

## 2022-03-09 MED ORDER — FLUOROURACIL CHEMO INJECTION 2.5 GM/50ML
550.0000 mg | Freq: Once | INTRAVENOUS | Status: AC
  Administered 2022-03-09: 550 mg via INTRAVENOUS
  Filled 2022-03-09: qty 11

## 2022-03-09 MED ORDER — LEUCOVORIN CALCIUM INJECTION 350 MG
320.0000 mg/m2 | Freq: Once | INTRAVENOUS | Status: AC
  Administered 2022-03-09: 628 mg via INTRAVENOUS
  Filled 2022-03-09: qty 31.4

## 2022-03-09 MED ORDER — SODIUM CHLORIDE 0.9 % IV SOLN: Freq: Once | INTRAVENOUS | Status: AC

## 2022-03-09 NOTE — Progress Notes (Signed)
New weight BSA 1.96 m2  Maintain previous ordered doses of Fluorouracil due to previous mouth ulcer dose modification.  Will confirm with Dr Delton Coombes on dose increase due to integrated scheduling orders.  Will discuss post his return from vacation.  Henreitta Leber, PharmD

## 2022-03-09 NOTE — Progress Notes (Signed)
Patient presents today for treatment. Vital signs within parameters for treatment. Labs pending. Patient denies any side effects today related to her last treatment. Patient denies pain today.   Labs within parameters for treatment today. Urine protein < 100 today.   Treatment given today per MD orders. Tolerated infusion without adverse affects. Vital signs stable. No complaints at this time. 5FU pump infusing and RUN noted on screen and verified with patient. Discharged from clinic ambulatory in stable condition. Alert and oriented x 3. F/U with [facility] as scheduled.

## 2022-03-09 NOTE — Patient Instructions (Signed)
[facility]  Discharge Instructions: Thank you for choosing [facility] to provide your oncology and hematology care.  If you have a lab appointment with the [facility], please come in thru the Main Entrance and check in at the main information desk.  Wear comfortable clothing and clothing appropriate for easy access to any Portacath or PICC line.   We strive to give you quality time with your provider. You may need to reschedule your appointment if you arrive late (15 or more minutes).  Arriving late affects you and other patients whose appointments are after yours.  Also, if you miss three or more appointments without notifying the office, you may be dismissed from the clinic at the provider's discretion.      For prescription refill requests, have your pharmacy contact our office and allow 72 hours for refills to be completed.    Today you received the following chemotherapy and/or immunotherapy agents Avastin, Leucovorin, Adrucil. Fluorouracil Injection What is this medication? FLUOROURACIL (flure oh YOOR a sil) treats some types of cancer. It works by slowing down the growth of cancer cells. This medicine may be used for other purposes; ask your health care provider or pharmacist if you have questions. COMMON BRAND NAME(S): Adrucil What should I tell my care team before I take this medication? They need to know if you have any of these conditions: Blood disorders Dihydropyrimidine dehydrogenase (DPD) deficiency Infection, such as chickenpox, cold sores, herpes Kidney disease Liver disease Poor nutrition Recent or ongoing radiation therapy An unusual or allergic reaction to fluorouracil, other medications, foods, dyes, or preservatives If you or your partner are pregnant or trying to get pregnant Breast-feeding How should I use this medication? This medication is injected into a vein. It is administered by your care team in a hospital or clinic  setting. Talk to your care team about the use of this medication in children. Special care may be needed. Overdosage: If you think you have taken too much of this medicine contact a poison control center or emergency room at once. NOTE: This medicine is only for you. Do not share this medicine with others. What if I miss a dose? Keep appointments for follow-up doses. It is important not to miss your dose. Call your care team if you are unable to keep an appointment. What may interact with this medication? Do not take this medication with any of the following: Live virus vaccines This medication may also interact with the following: Medications that treat or prevent blood clots, such as warfarin, enoxaparin, dalteparin This list may not describe all possible interactions. Give your health care provider a list of all the medicines, herbs, non-prescription drugs, or dietary supplements you use. Also tell them if you smoke, drink alcohol, or use illegal drugs. Some items may interact with your medicine. What should I watch for while using this medication? Your condition will be monitored carefully while you are receiving this medication. This medication may make you feel generally unwell. This is not uncommon as chemotherapy can affect healthy cells as well as cancer cells. Report any side effects. Continue your course of treatment even though you feel ill unless your care team tells you to stop. In some cases, you may be given additional medications to help with side effects. Follow all directions for their use. This medication may increase your risk of getting an infection. Call your care team for advice if you get a fever, chills, sore throat, or other  symptoms of a cold or flu. Do not treat yourself. Try to avoid being around people who are sick. This medication may increase your risk to bruise or bleed. Call your care team if you notice any unusual bleeding. Be careful brushing or flossing your  teeth or using a toothpick because you may get an infection or bleed more easily. If you have any dental work done, tell your dentist you are receiving this medication. Avoid taking medications that contain aspirin, acetaminophen, ibuprofen, naproxen, or ketoprofen unless instructed by your care team. These medications may hide a fever. Do not treat diarrhea with over the counter products. Contact your care team if you have diarrhea that lasts more than 2 days or if it is severe and watery. This medication can make you more sensitive to the sun. Keep out of the sun. If you cannot avoid being in the sun, wear protective clothing and sunscreen. Do not use sun lamps, tanning beds, or tanning booths. Talk to your care team if you or your partner wish to become pregnant or think you might be pregnant. This medication can cause serious birth defects if taken during pregnancy and for 3 months after the last dose. A reliable form of contraception is recommended while taking this medication and for 3 months after the last dose. Talk to your care team about effective forms of contraception. Do not father a child while taking this medication and for 3 months after the last dose. Use a condom while having sex during this time period. Do not breastfeed while taking this medication. This medication may cause infertility. Talk to your care team if you are concerned about your fertility. What side effects may I notice from receiving this medication? Side effects that you should report to your care team as soon as possible: Allergic reactions--skin rash, itching, hives, swelling of the face, lips, tongue, or throat Heart attack--pain or tightness in the chest, shoulders, arms, or jaw, nausea, shortness of breath, cold or clammy skin, feeling faint or lightheaded Heart failure--shortness of breath, swelling of the ankles, feet, or hands, sudden weight gain, unusual weakness or fatigue Heart rhythm changes--fast or  irregular heartbeat, dizziness, feeling faint or lightheaded, chest pain, trouble breathing High ammonia level--unusual weakness or fatigue, confusion, loss of appetite, nausea, vomiting, seizures Infection--fever, chills, cough, sore throat, wounds that don't heal, pain or trouble when passing urine, general feeling of discomfort or being unwell Low red blood cell level--unusual weakness or fatigue, dizziness, headache, trouble breathing Pain, tingling, or numbness in the hands or feet, muscle weakness, change in vision, confusion or trouble speaking, loss of balance or coordination, trouble walking, seizures Redness, swelling, and blistering of the skin over hands and feet Severe or prolonged diarrhea Unusual bruising or bleeding Side effects that usually do not require medical attention (report to your care team if they continue or are bothersome): Dry skin Headache Increased tears Nausea Pain, redness, or swelling with sores inside the mouth or throat Sensitivity to light Vomiting This list may not describe all possible side effects. Call your doctor for medical advice about side effects. You may report side effects to FDA at 1-800-FDA-1088. Where should I keep my medication? This medication is given in a hospital or clinic. It will not be stored at home. NOTE: This sheet is a summary. It may not cover all possible information. If you have questions about this medicine, talk to your doctor, pharmacist, or health care provider.  2023 Elsevier/Gold Standard (2021-10-21 00:00:00)  Leucovorin Injection  What is this medication? LEUCOVORIN (loo koe VOR in) prevents side effects from certain medications, such as methotrexate. It works by increasing folate levels. This helps protect healthy cells in your body. It may also be used to treat anemia caused by low levels of folate. It can also be used with fluorouracil, a type of chemotherapy, to treat colorectal cancer. It works by increasing the  effects of fluorouracil in the body. This medicine may be used for other purposes; ask your health care provider or pharmacist if you have questions. What should I tell my care team before I take this medication? They need to know if you have any of these conditions: Anemia from low levels of vitamin B12 in the blood An unusual or allergic reaction to leucovorin, folic acid, other medications, foods, dyes, or preservatives Pregnant or trying to get pregnant Breastfeeding How should I use this medication? This medication is injected into a vein or a muscle. It is given by your care team in a hospital or clinic setting. Talk to your care team about the use of this medication in children. Special care may be needed. Overdosage: If you think you have taken too much of this medicine contact a poison control center or emergency room at once. NOTE: This medicine is only for you. Do not share this medicine with others. What if I miss a dose? Keep appointments for follow-up doses. It is important not to miss your dose. Call your care team if you are unable to keep an appointment. What may interact with this medication? Capecitabine Fluorouracil Phenobarbital Phenytoin Primidone Trimethoprim;sulfamethoxazole This list may not describe all possible interactions. Give your health care provider a list of all the medicines, herbs, non-prescription drugs, or dietary supplements you use. Also tell them if you smoke, drink alcohol, or use illegal drugs. Some items may interact with your medicine. What should I watch for while using this medication? Your condition will be monitored carefully while you are receiving this medication. This medication may increase the side effects of 5-fluorouracil. Tell your care team if you have diarrhea or mouth sores that do not get better or that get worse. What side effects may I notice from receiving this medication? Side effects that you should report to your care team  as soon as possible: Allergic reactions--skin rash, itching, hives, swelling of the face, lips, tongue, or throat This list may not describe all possible side effects. Call your doctor for medical advice about side effects. You may report side effects to FDA at 1-800-FDA-1088. Where should I keep my medication? This medication is given in a hospital or clinic. It will not be stored at home. NOTE: This sheet is a summary. It may not cover all possible information. If you have questions about this medicine, talk to your doctor, pharmacist, or health care provider.  2023 Elsevier/Gold Standard (2021-10-24 00:00:00)       To help prevent nausea and vomiting after your treatment, we encourage you to take your nausea medication as directed.  BELOW ARE SYMPTOMS THAT SHOULD BE REPORTED IMMEDIATELY: *FEVER GREATER THAN 100.4 F (38 C) OR HIGHER *CHILLS OR SWEATING *NAUSEA AND VOMITING THAT IS NOT CONTROLLED WITH YOUR NAUSEA MEDICATION *UNUSUAL SHORTNESS OF BREATH *UNUSUAL BRUISING OR BLEEDING *URINARY PROBLEMS (pain or burning when urinating, or frequent urination) *BOWEL PROBLEMS (unusual diarrhea, constipation, pain near the anus) TENDERNESS IN MOUTH AND THROAT WITH OR WITHOUT PRESENCE OF ULCERS (sore throat, sores in mouth, or a toothache) UNUSUAL RASH, SWELLING OR PAIN  UNUSUAL VAGINAL DISCHARGE OR ITCHING   Items with * indicate a potential emergency and should be followed up as soon as possible or go to the Emergency Department if any problems should occur.  Please show the CHEMOTHERAPY ALERT CARD or IMMUNOTHERAPY ALERT CARD at check-in to the Emergency Department and triage nurse.  Should you have questions after your visit or need to cancel or reschedule your appointment, please contact [facility] [phone]  and follow the prompts.  Office hours are 8:00 a.m. to 4:30 p.m. Monday - Friday. Please note that voicemails left after 4:00 p.m. may not be returned until  the following business day.  We are closed weekends and major holidays. You have access to a nurse at all times for urgent questions. Please call the main number to the clinic [phone] and follow the prompts.  For any non-urgent questions, you may also contact your provider using MyChart. We now offer e-Visits for anyone 59 and older to request care online for non-urgent symptoms. For details visit [email or web address].   Also download the MyChart app! Go to the app store, search "MyChart", open the app, select [facility], and log in with your MyChart username and password.  Masks are optional in the cancer centers. If you would like for your care team to wear a mask while they are taking care of you, please let them know. You may have one support person who is at least 62 years old accompany you for your appointments.

## 2022-03-11 ENCOUNTER — Inpatient Hospital Stay: Payer: Medicaid Other

## 2022-03-11 VITALS — BP 135/80 | HR 74 | Temp 98.4°F | Resp 18

## 2022-03-11 DIAGNOSIS — Z5111 Encounter for antineoplastic chemotherapy: Secondary | ICD-10-CM | POA: Diagnosis not present

## 2022-03-11 DIAGNOSIS — C189 Malignant neoplasm of colon, unspecified: Secondary | ICD-10-CM

## 2022-03-11 MED ORDER — HEPARIN SOD (PORK) LOCK FLUSH 100 UNIT/ML IV SOLN
500.0000 [IU] | Freq: Once | INTRAVENOUS | Status: AC | PRN
  Administered 2022-03-11: 500 [IU]

## 2022-03-11 MED ORDER — SODIUM CHLORIDE 0.9% FLUSH
10.0000 mL | INTRAVENOUS | Status: DC | PRN
  Administered 2022-03-11: 10 mL

## 2022-03-11 NOTE — Patient Instructions (Signed)
[facility]  Discharge Instructions: Thank you for choosing [facility] to provide your oncology and hematology care.  If you have a lab appointment with the [facility], please come in thru the Main Entrance and check in at the main information desk.  Wear comfortable clothing and clothing appropriate for easy access to any Portacath or PICC line.   We strive to give you quality time with your provider. You may need to reschedule your appointment if you arrive late (15 or more minutes).  Arriving late affects you and other patients whose appointments are after yours.  Also, if you miss three or more appointments without notifying the office, you may be dismissed from the clinic at the provider's discretion.      For prescription refill requests, have your pharmacy contact our office and allow 72 hours for refills to be completed.    Today you received the following pump d/c return as scheduled.   To help prevent nausea and vomiting after your treatment, we encourage you to take your nausea medication as directed.  BELOW ARE SYMPTOMS THAT SHOULD BE REPORTED IMMEDIATELY: *FEVER GREATER THAN 100.4 F (38 C) OR HIGHER *CHILLS OR SWEATING *NAUSEA AND VOMITING THAT IS NOT CONTROLLED WITH YOUR NAUSEA MEDICATION *UNUSUAL SHORTNESS OF BREATH *UNUSUAL BRUISING OR BLEEDING *URINARY PROBLEMS (pain or burning when urinating, or frequent urination) *BOWEL PROBLEMS (unusual diarrhea, constipation, pain near the anus) TENDERNESS IN MOUTH AND THROAT WITH OR WITHOUT PRESENCE OF ULCERS (sore throat, sores in mouth, or a toothache) UNUSUAL RASH, SWELLING OR PAIN  UNUSUAL VAGINAL DISCHARGE OR ITCHING   Items with * indicate a potential emergency and should be followed up as soon as possible or go to the Emergency Department if any problems should occur.  Please show the CHEMOTHERAPY ALERT CARD or IMMUNOTHERAPY ALERT CARD at check-in to the Emergency Department and triage  nurse.  Should you have questions after your visit or need to cancel or reschedule your appointment, please contact [facility] [phone]  and follow the prompts.  Office hours are 8:00 a.m. to 4:30 p.m. Monday - Friday. Please note that voicemails left after 4:00 p.m. may not be returned until the following business day.  We are closed weekends and major holidays. You have access to a nurse at all times for urgent questions. Please call the main number to the clinic [phone] and follow the prompts.  For any non-urgent questions, you may also contact your provider using MyChart. We now offer e-Visits for anyone 39 and older to request care online for non-urgent symptoms. For details visit [email or web address].   Also download the MyChart app! Go to the app store, search "MyChart", open the app, select [facility], and log in with your MyChart username and password.  Masks are optional in the cancer centers. If you would like for your care team to wear a mask while they are taking care of you, please let them know. You may have one support person who is at least 62 years old accompany you for your appointments.

## 2022-03-11 NOTE — Progress Notes (Signed)
Patient presents today for pump d/c.  Port flushed with good blood return noted. No bruising or swelling at site. Bandaid applied and patient discharged in satisfactory condition. VVS stable with no signs or symptoms of distressed noted.  

## 2022-03-12 ENCOUNTER — Other Ambulatory Visit: Payer: Self-pay

## 2022-03-18 IMAGING — CT CT CHEST-ABD-PELV W/ CM
2 of 5 series · 11 of 36 positions shown, 16 images · IV contrast (Omnipaque or Isovue)
Comparison: 02/07/2021

CLINICAL DATA: Metastatic colon cancer, liver metastases, status
post resection and colostomy, ongoing chemotherapy

EXAM:
CT CHEST, ABDOMEN, AND PELVIS WITH CONTRAST
TECHNIQUE: Multidetector CT imaging of the chest, abdomen and pelvis was
performed following the standard protocol during bolus
administration of intravenous contrast.
CONTRAST:  100mL OMNIPAQUE IOHEXOL 300 MG/ML SOLN, additional oral
enteric contrast

[Series 2: cap with · axial · 0.75mm/px · z∈[-331,+184]mm · 8 of 133 slices shown, 13 images]
[im 15/133  mediastinal]
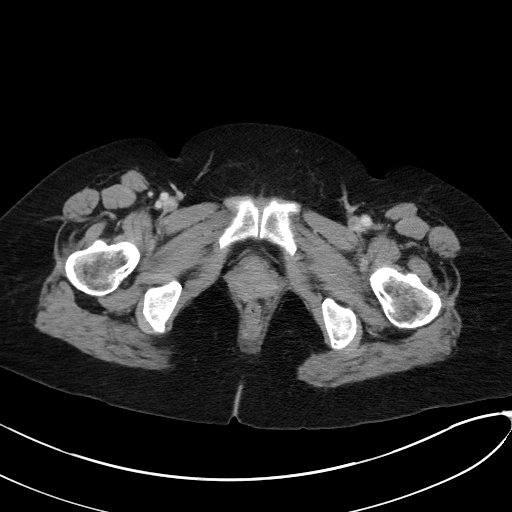
[im 15/133  bone]
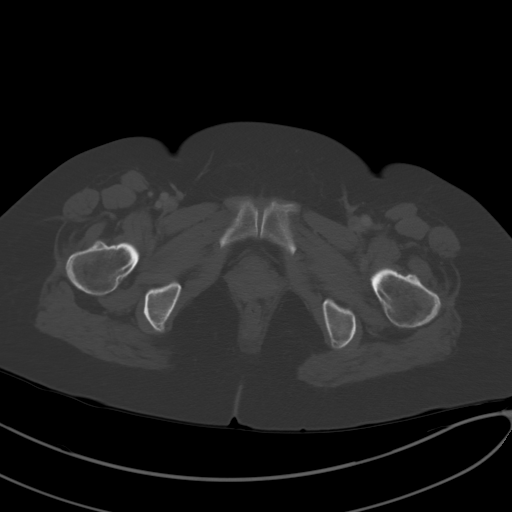
[im 30/133  mediastinal]
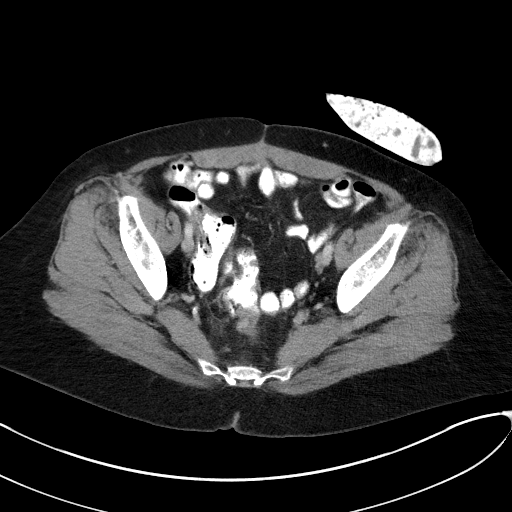
[im 45/133  mediastinal]
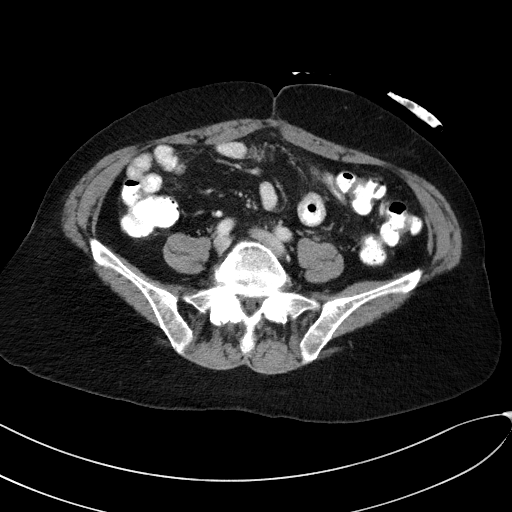
[im 59/133  mediastinal]
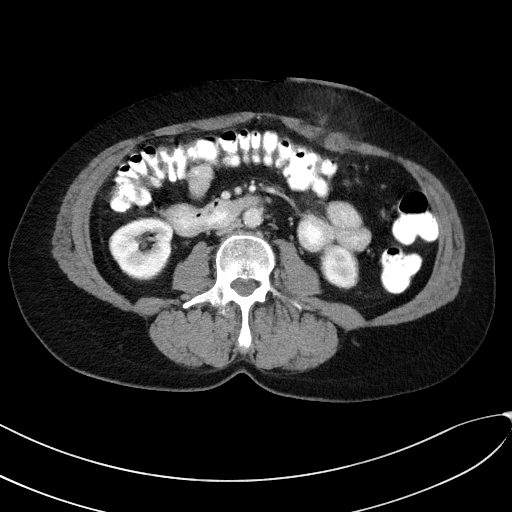
[im 74/133  mediastinal]
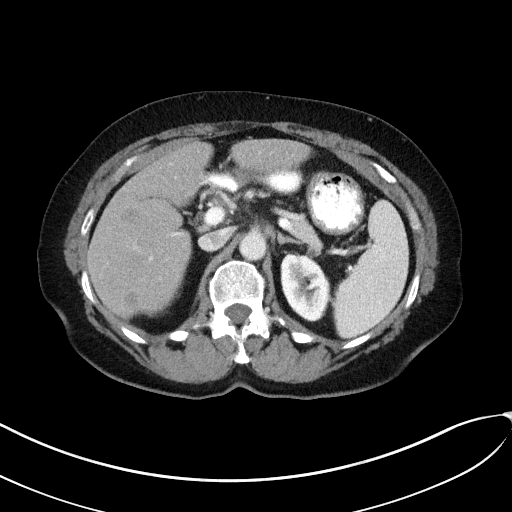
[im 74/133  lung]
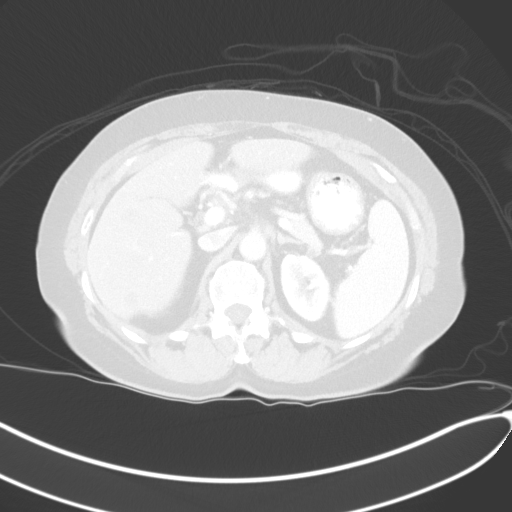
[im 89/133  mediastinal]
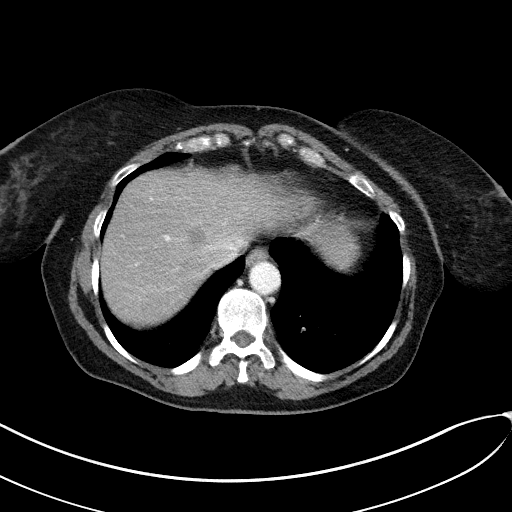
[im 89/133  lung]
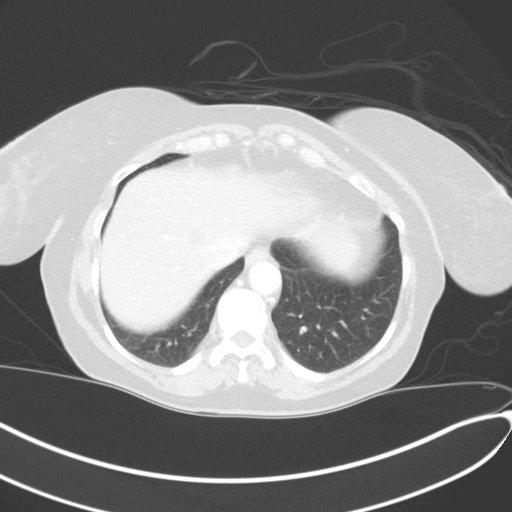
[im 103/133  mediastinal]
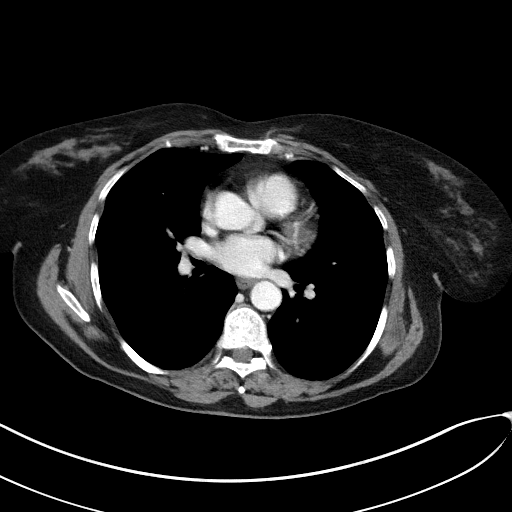
[im 103/133  lung]
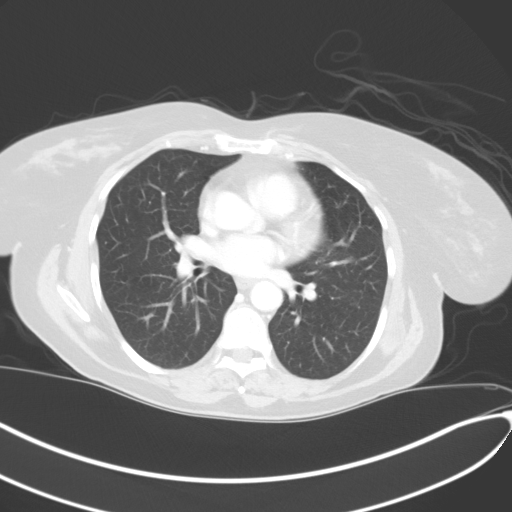
[im 118/133  mediastinal]
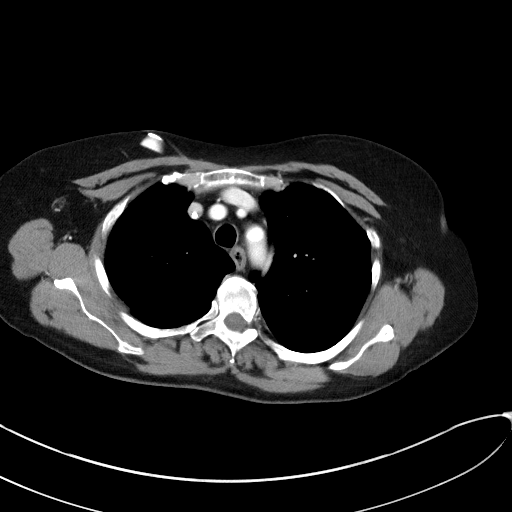
[im 118/133  lung]
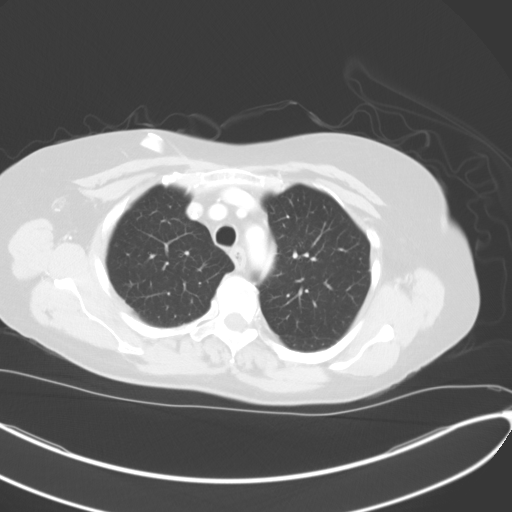

[Series 4: coronals · coronal · 0.80mm/px · 3 of 147 slices shown]
[im 30/147  mediastinal]
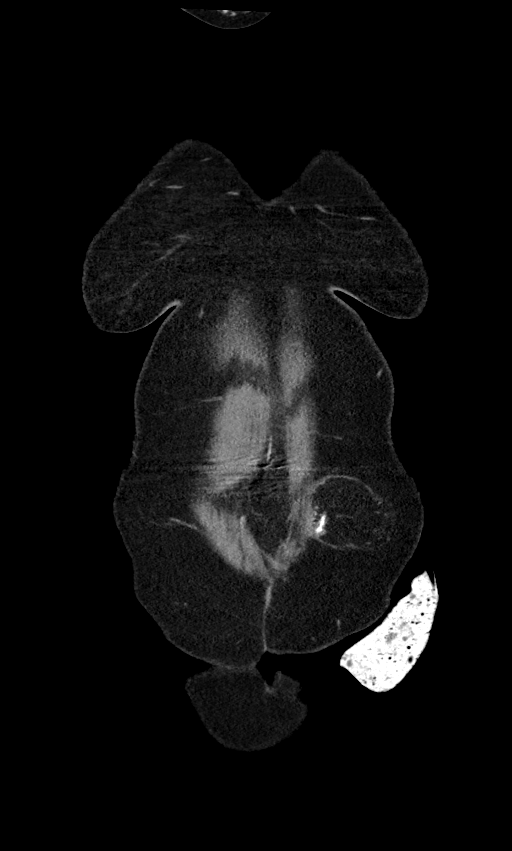
[im 59/147  mediastinal]
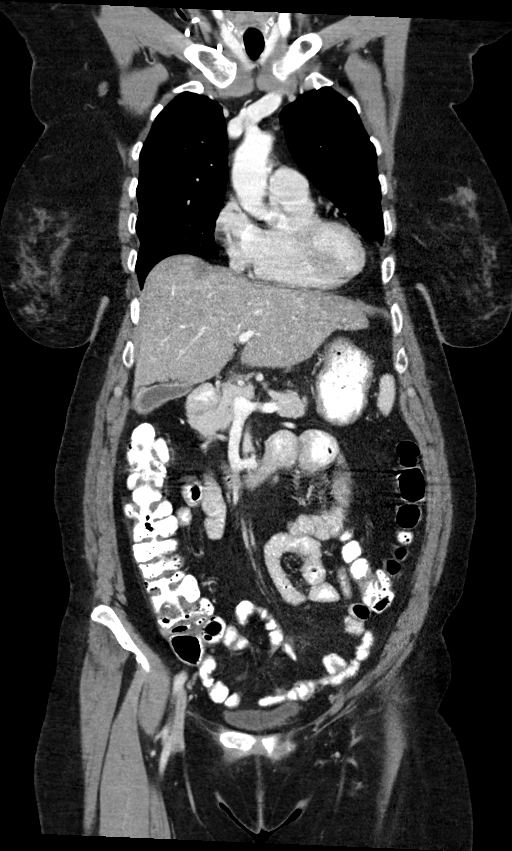
[im 88/147  mediastinal]
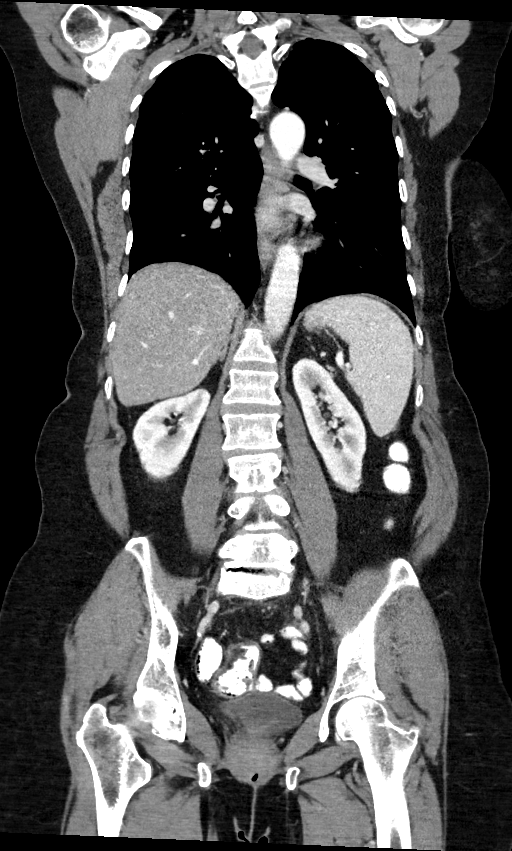

[11 of 36 positions shown; findings below may reference images not displayed]

FINDINGS: CT CHEST FINDINGS

Cardiovascular: Right chest port catheter. Normal heart size. No
pericardial effusion.

Mediastinum/Nodes: No enlarged mediastinal, hilar, or axillary lymph
nodes. Thyroid gland, trachea, and esophagus demonstrate no
significant findings.

Lungs/Pleura: Occasional small pulmonary nodules are unchanged, for
example a 0.3 cm subpleural nodule of the lateral segment right lobe
(series 3, image 93) and a 0.9 cm fissural nodule of the superior
segment left lower lobe (series 3, image 74). No new nodules. No
pleural effusion or pneumothorax.

Musculoskeletal: No chest wall mass or suspicious bone lesions
identified.

CT ABDOMEN PELVIS FINDINGS

Hepatobiliary: Multiple hypodense liver lesions are not
significantly changed compared to prior examination, index lesion in
the posterior liver dome, hepatic segment VII, measuring 2.3 x
cm (series 2, image 50), index lesion in the right lobe of the liver
adjacent to the gallbladder fossa, hepatic segment V, measuring
x 1.6 cm (series 2, image 62), and lesion in the central liver,
hepatic segment SHIGEYOSHI, measuring 1.6 x 1.5 cm (series 2, image 46). No
gallstones, gallbladder wall thickening, or biliary dilatation.

Pancreas: Unremarkable. No pancreatic ductal dilatation or
surrounding inflammatory changes.

Spleen: Normal in size without significant abnormality.

Adrenals/Urinary Tract: Adrenal glands are unremarkable. Kidneys are
normal, without renal calculi, solid lesion, or hydronephrosis.
Bladder is unremarkable.

Stomach/Bowel: Stomach is within normal limits. Status post sigmoid
colon resection with left lower quadrant end colostomy and rectal
stump.

Vascular/Lymphatic: Aortic atherosclerosis. No enlarged abdominal or
pelvic lymph nodes.

Reproductive: Status post hysterectomy.

Other: No abdominal wall hernia or abnormality. No abdominopelvic
ascites.

Musculoskeletal: No acute or significant osseous findings.
IMPRESSION: 1. Multiple hypodense liver lesions are not significantly changed
compared to prior examination, consistent with stable metastatic
disease.
2. Occasional small pulmonary nodules are unchanged, which remain
nonspecific although very likely benign, incidental sequelae of
prior infection or inflammation. Attention on follow-up.
3. Status post sigmoid colon resection with left lower quadrant end
colostomy.

Aortic Atherosclerosis ([id number].[id number]).

## 2022-03-20 ENCOUNTER — Other Ambulatory Visit: Payer: Self-pay

## 2022-03-23 ENCOUNTER — Other Ambulatory Visit: Payer: Self-pay | Admitting: *Deleted

## 2022-03-23 ENCOUNTER — Inpatient Hospital Stay: Payer: Medicaid Other

## 2022-03-23 DIAGNOSIS — E876 Hypokalemia: Secondary | ICD-10-CM

## 2022-03-23 DIAGNOSIS — C787 Secondary malignant neoplasm of liver and intrahepatic bile duct: Secondary | ICD-10-CM

## 2022-03-23 MED ORDER — POTASSIUM CHLORIDE CRYS ER 10 MEQ PO TBCR: 20.0000 meq | EXTENDED_RELEASE_TABLET | Freq: Three times a day (TID) | ORAL | 6 refills | Status: DC

## 2022-03-25 ENCOUNTER — Inpatient Hospital Stay: Payer: Medicaid Other

## 2022-03-30 ENCOUNTER — Inpatient Hospital Stay: Payer: Medicaid Other | Attending: Hematology

## 2022-03-30 ENCOUNTER — Inpatient Hospital Stay: Payer: Medicaid Other

## 2022-03-30 VITALS — BP 158/84 | HR 76 | Temp 98.1°F | Resp 18

## 2022-03-30 DIAGNOSIS — D696 Thrombocytopenia, unspecified: Secondary | ICD-10-CM | POA: Diagnosis not present

## 2022-03-30 DIAGNOSIS — C787 Secondary malignant neoplasm of liver and intrahepatic bile duct: Secondary | ICD-10-CM | POA: Insufficient documentation

## 2022-03-30 DIAGNOSIS — E876 Hypokalemia: Secondary | ICD-10-CM | POA: Insufficient documentation

## 2022-03-30 DIAGNOSIS — R911 Solitary pulmonary nodule: Secondary | ICD-10-CM | POA: Diagnosis not present

## 2022-03-30 DIAGNOSIS — R0602 Shortness of breath: Secondary | ICD-10-CM | POA: Insufficient documentation

## 2022-03-30 DIAGNOSIS — Z801 Family history of malignant neoplasm of trachea, bronchus and lung: Secondary | ICD-10-CM | POA: Diagnosis not present

## 2022-03-30 DIAGNOSIS — C189 Malignant neoplasm of colon, unspecified: Secondary | ICD-10-CM

## 2022-03-30 DIAGNOSIS — D509 Iron deficiency anemia, unspecified: Secondary | ICD-10-CM | POA: Insufficient documentation

## 2022-03-30 DIAGNOSIS — Z5111 Encounter for antineoplastic chemotherapy: Secondary | ICD-10-CM | POA: Diagnosis present

## 2022-03-30 DIAGNOSIS — C187 Malignant neoplasm of sigmoid colon: Secondary | ICD-10-CM | POA: Diagnosis not present

## 2022-03-30 DIAGNOSIS — I10 Essential (primary) hypertension: Secondary | ICD-10-CM | POA: Insufficient documentation

## 2022-03-30 DIAGNOSIS — Z87891 Personal history of nicotine dependence: Secondary | ICD-10-CM | POA: Insufficient documentation

## 2022-03-30 DIAGNOSIS — F419 Anxiety disorder, unspecified: Secondary | ICD-10-CM | POA: Diagnosis not present

## 2022-03-30 DIAGNOSIS — G629 Polyneuropathy, unspecified: Secondary | ICD-10-CM | POA: Insufficient documentation

## 2022-03-30 LAB — COMPREHENSIVE METABOLIC PANEL
ALT: 28 U/L (ref 0–44)
AST: 34 U/L (ref 15–41)
Albumin: 3.7 g/dL (ref 3.5–5.0)
Alkaline Phosphatase: 82 U/L (ref 38–126)
Anion gap: 8 (ref 5–15)
BUN: 15 mg/dL (ref 8–23)
CO2: 24 mmol/L (ref 22–32)
Calcium: 8.9 mg/dL (ref 8.9–10.3)
Chloride: 102 mmol/L (ref 98–111)
Creatinine, Ser: 0.99 mg/dL (ref 0.44–1.00)
GFR, Estimated: >60 mL/min (ref >60)
Glucose, Bld: 167 mg/dL — ABNORMAL HIGH (ref 70–99)
Potassium: 3.3 mmol/L — ABNORMAL LOW (ref 3.5–5.1)
Sodium: 134 mmol/L — ABNORMAL LOW (ref 135–145)
Total Bilirubin: 1.3 mg/dL — ABNORMAL HIGH (ref 0.3–1.2)
Total Protein: 7.5 g/dL (ref 6.5–8.1)

## 2022-03-30 LAB — CBC WITH DIFFERENTIAL/PLATELET
Abs Immature Granulocytes: 0.02 10*3/uL (ref 0.00–0.07)
Basophils Absolute: 0.1 10*3/uL (ref 0.0–0.1)
Basophils Relative: 1 %
Eosinophils Absolute: 0.1 10*3/uL (ref 0.0–0.5)
Eosinophils Relative: 2 %
HCT: 40.9 % (ref 36.0–46.0)
Hemoglobin: 13.8 g/dL (ref 12.0–15.0)
Immature Granulocytes: 0 %
Lymphocytes Relative: 31 %
Lymphs Abs: 1.5 10*3/uL (ref 0.7–4.0)
MCH: 32.1 pg (ref 26.0–34.0)
MCHC: 33.7 g/dL (ref 30.0–36.0)
MCV: 95.1 fL (ref 80.0–100.0)
Monocytes Absolute: 0.6 10*3/uL (ref 0.1–1.0)
Monocytes Relative: 12 %
Neutro Abs: 2.6 10*3/uL (ref 1.7–7.7)
Neutrophils Relative %: 54 %
Platelets: 168 10*3/uL (ref 150–400)
RBC: 4.3 MIL/uL (ref 3.87–5.11)
RDW: 15.7 % — ABNORMAL HIGH (ref 11.5–15.5)
WBC: 4.8 10*3/uL (ref 4.0–10.5)
nRBC: 0 % (ref 0.0–0.2)

## 2022-03-30 LAB — MAGNESIUM: Magnesium: 2 mg/dL (ref 1.7–2.4)

## 2022-03-30 MED ORDER — SODIUM CHLORIDE 0.9 % IV SOLN
3250.0000 mg | INTRAVENOUS | Status: DC
  Administered 2022-03-30: 3250 mg via INTRAVENOUS
  Filled 2022-03-30: qty 65

## 2022-03-30 MED ORDER — PALONOSETRON HCL INJECTION 0.25 MG/5ML
0.2500 mg | Freq: Once | INTRAVENOUS | Status: AC
  Administered 2022-03-30: 0.25 mg via INTRAVENOUS
  Filled 2022-03-30: qty 5

## 2022-03-30 MED ORDER — SODIUM CHLORIDE 0.9 % IV SOLN
320.0000 mg/m2 | Freq: Once | INTRAVENOUS | Status: AC
  Administered 2022-03-30: 628 mg via INTRAVENOUS
  Filled 2022-03-30: qty 31.4

## 2022-03-30 MED ORDER — FLUOROURACIL CHEMO INJECTION 2.5 GM/50ML
290.0000 mg/m2 | Freq: Once | INTRAVENOUS | Status: AC
  Administered 2022-03-30: 550 mg via INTRAVENOUS
  Filled 2022-03-30: qty 11

## 2022-03-30 MED ORDER — SODIUM CHLORIDE 0.9 % IV SOLN: Freq: Once | INTRAVENOUS | Status: AC

## 2022-03-30 MED ORDER — SODIUM CHLORIDE 0.9 % IV SOLN
5.0000 mg/kg | Freq: Once | INTRAVENOUS | Status: AC
  Administered 2022-03-30: 400 mg via INTRAVENOUS
  Filled 2022-03-30: qty 16

## 2022-03-30 MED ORDER — SODIUM CHLORIDE 0.9 % IV SOLN
10.0000 mg | Freq: Once | INTRAVENOUS | Status: AC
  Administered 2022-03-30: 10 mg via INTRAVENOUS
  Filled 2022-03-30: qty 1

## 2022-03-30 NOTE — Progress Notes (Signed)
Maintain dose of:  Fluorouacil IVPush at 550 mg and Fluorouracil CIV at 3250 mg despite weight increase.  T.O. Dr Rhys Martini, PharmD

## 2022-03-30 NOTE — Progress Notes (Signed)
Labs reviewed, ok to treat per parameters. No new complaints from patient today.   Treatment given per orders. Patient tolerated it well without problems. Vitals stable and discharged home from clinic ambulatory. Follow up as scheduled.

## 2022-03-30 NOTE — Patient Instructions (Signed)
[facility]  Discharge Instructions: Thank you for choosing [facility] to provide your oncology and hematology care.  If you have a lab appointment with the [facility], please come in thru the Main Entrance and check in at the main information desk.  Wear comfortable clothing and clothing appropriate for easy access to any Portacath or PICC line.   We strive to give you quality time with your provider. You may need to reschedule your appointment if you arrive late (15 or more minutes).  Arriving late affects you and other patients whose appointments are after yours.  Also, if you miss three or more appointments without notifying the office, you may be dismissed from the clinic at the provider's discretion.      For prescription refill requests, have your pharmacy contact our office and allow 72 hours for refills to be completed.    Today you received the following chemotherapy and/or immunotherapy agents leucovorin, MVASI, fluorouracil.       To help prevent nausea and vomiting after your treatment, we encourage you to take your nausea medication as directed.  BELOW ARE SYMPTOMS THAT SHOULD BE REPORTED IMMEDIATELY: *FEVER GREATER THAN 100.4 F (38 C) OR HIGHER *CHILLS OR SWEATING *NAUSEA AND VOMITING THAT IS NOT CONTROLLED WITH YOUR NAUSEA MEDICATION *UNUSUAL SHORTNESS OF BREATH *UNUSUAL BRUISING OR BLEEDING *URINARY PROBLEMS (pain or burning when urinating, or frequent urination) *BOWEL PROBLEMS (unusual diarrhea, constipation, pain near the anus) TENDERNESS IN MOUTH AND THROAT WITH OR WITHOUT PRESENCE OF ULCERS (sore throat, sores in mouth, or a toothache) UNUSUAL RASH, SWELLING OR PAIN  UNUSUAL VAGINAL DISCHARGE OR ITCHING   Items with * indicate a potential emergency and should be followed up as soon as possible or go to the Emergency Department if any problems should occur.  Please show the CHEMOTHERAPY ALERT CARD or IMMUNOTHERAPY ALERT CARD at  check-in to the Emergency Department and triage nurse.  Should you have questions after your visit or need to cancel or reschedule your appointment, please contact [facility] [phone]  and follow the prompts.  Office hours are 8:00 a.m. to 4:30 p.m. Monday - Friday. Please note that voicemails left after 4:00 p.m. may not be returned until the following business day.  We are closed weekends and major holidays. You have access to a nurse at all times for urgent questions. Please call the main number to the clinic [phone] and follow the prompts.  For any non-urgent questions, you may also contact your provider using MyChart. We now offer e-Visits for anyone 45 and older to request care online for non-urgent symptoms. For details visit [email or web address].   Also download the MyChart app! Go to the app store, search "MyChart", open the app, select [facility], and log in with your MyChart username and password.  Masks are optional in the cancer centers. If you would like for your care team to wear a mask while they are taking care of you, please let them know. You may have one support person who is at least 62 years old accompany you for your appointments.

## 2022-03-31 ENCOUNTER — Other Ambulatory Visit: Payer: Self-pay

## 2022-04-01 ENCOUNTER — Inpatient Hospital Stay: Payer: Medicaid Other

## 2022-04-01 VITALS — BP 168/88 | HR 88 | Temp 99.4°F | Resp 20

## 2022-04-01 DIAGNOSIS — C189 Malignant neoplasm of colon, unspecified: Secondary | ICD-10-CM

## 2022-04-01 DIAGNOSIS — Z5111 Encounter for antineoplastic chemotherapy: Secondary | ICD-10-CM | POA: Diagnosis not present

## 2022-04-01 MED ORDER — SODIUM CHLORIDE 0.9% FLUSH
10.0000 mL | INTRAVENOUS | Status: DC | PRN
  Administered 2022-04-01: 10 mL

## 2022-04-01 MED ORDER — HEPARIN SOD (PORK) LOCK FLUSH 100 UNIT/ML IV SOLN
500.0000 [IU] | Freq: Once | INTRAVENOUS | Status: AC | PRN
  Administered 2022-04-01: 500 [IU]

## 2022-04-01 NOTE — Patient Instructions (Signed)
[facility]  Discharge Instructions: Thank you for choosing [facility] to provide your oncology and hematology care.  If you have a lab appointment with the [facility], please come in thru the Main Entrance and check in at the main information desk.  Wear comfortable clothing and clothing appropriate for easy access to any Portacath or PICC line.   We strive to give you quality time with your provider. You may need to reschedule your appointment if you arrive late (15 or more minutes).  Arriving late affects you and other patients whose appointments are after yours.  Also, if you miss three or more appointments without notifying the office, you may be dismissed from the clinic at the provider's discretion.      For prescription refill requests, have your pharmacy contact our office and allow 72 hours for refills to be completed.    Today you received the following pump d/c with port flush, return as scheduled.   To help prevent nausea and vomiting after your treatment, we encourage you to take your nausea medication as directed.  BELOW ARE SYMPTOMS THAT SHOULD BE REPORTED IMMEDIATELY: *FEVER GREATER THAN 100.4 F (38 C) OR HIGHER *CHILLS OR SWEATING *NAUSEA AND VOMITING THAT IS NOT CONTROLLED WITH YOUR NAUSEA MEDICATION *UNUSUAL SHORTNESS OF BREATH *UNUSUAL BRUISING OR BLEEDING *URINARY PROBLEMS (pain or burning when urinating, or frequent urination) *BOWEL PROBLEMS (unusual diarrhea, constipation, pain near the anus) TENDERNESS IN MOUTH AND THROAT WITH OR WITHOUT PRESENCE OF ULCERS (sore throat, sores in mouth, or a toothache) UNUSUAL RASH, SWELLING OR PAIN  UNUSUAL VAGINAL DISCHARGE OR ITCHING   Items with * indicate a potential emergency and should be followed up as soon as possible or go to the Emergency Department if any problems should occur.  Please show the CHEMOTHERAPY ALERT CARD or IMMUNOTHERAPY ALERT CARD at check-in to the Emergency  Department and triage nurse.  Should you have questions after your visit or need to cancel or reschedule your appointment, please contact [place] [phone]  and follow the prompts.  Office hours are 8:00 a.m. to 4:30 p.m. Monday - Friday. Please note that voicemails left after 4:00 p.m. may not be returned until the following business day.  We are closed weekends and major holidays. You have access to a nurse at all times for urgent questions. Please call the main number to the clinic [phone] and follow the prompts.  For any non-urgent questions, you may also contact your provider using MyChart. We now offer e-Visits for anyone 103 and older to request care online for non-urgent symptoms. For details visit [email or web address].   Also download the MyChart app! Go to the app store, search "MyChart", open the app, select [facility], and log in with your MyChart username and password.  Masks are optional in the cancer centers. If you would like for your care team to wear a mask while they are taking care of you, please let them know. You may have one support person who is at least 62 years old accompany you for your appointments.

## 2022-04-01 NOTE — Progress Notes (Signed)
Chemo pump disconnected and port flushed with good blood return noted. No bruising or swelling at site. Bandaid applied and patient discharged in satisfactory condition. VVS stable with no signs or symptoms of distressed noted.

## 2022-04-06 ENCOUNTER — Inpatient Hospital Stay: Payer: Medicaid Other

## 2022-04-06 ENCOUNTER — Inpatient Hospital Stay: Payer: Medicaid Other | Admitting: Hematology

## 2022-04-13 ENCOUNTER — Inpatient Hospital Stay: Payer: Medicaid Other

## 2022-04-13 ENCOUNTER — Other Ambulatory Visit: Payer: Self-pay

## 2022-04-13 ENCOUNTER — Inpatient Hospital Stay (HOSPITAL_BASED_OUTPATIENT_CLINIC_OR_DEPARTMENT_OTHER): Payer: Medicaid Other | Admitting: Hematology

## 2022-04-13 VITALS — BP 157/85 | HR 80 | Temp 97.5°F | Resp 17

## 2022-04-13 DIAGNOSIS — C189 Malignant neoplasm of colon, unspecified: Secondary | ICD-10-CM | POA: Diagnosis not present

## 2022-04-13 DIAGNOSIS — Z5111 Encounter for antineoplastic chemotherapy: Secondary | ICD-10-CM | POA: Diagnosis not present

## 2022-04-13 DIAGNOSIS — C787 Secondary malignant neoplasm of liver and intrahepatic bile duct: Secondary | ICD-10-CM | POA: Diagnosis not present

## 2022-04-13 LAB — COMPREHENSIVE METABOLIC PANEL
ALT: 32 U/L (ref 0–44)
AST: 34 U/L (ref 15–41)
Albumin: 3.5 g/dL (ref 3.5–5.0)
Alkaline Phosphatase: 74 U/L (ref 38–126)
Anion gap: 9 (ref 5–15)
BUN: 12 mg/dL (ref 8–23)
CO2: 25 mmol/L (ref 22–32)
Calcium: 9 mg/dL (ref 8.9–10.3)
Chloride: 102 mmol/L (ref 98–111)
Creatinine, Ser: 1 mg/dL (ref 0.44–1.00)
GFR, Estimated: >60 mL/min (ref >60)
Glucose, Bld: 143 mg/dL — ABNORMAL HIGH (ref 70–99)
Potassium: 3.4 mmol/L — ABNORMAL LOW (ref 3.5–5.1)
Sodium: 136 mmol/L (ref 135–145)
Total Bilirubin: 0.6 mg/dL (ref 0.3–1.2)
Total Protein: 7.3 g/dL (ref 6.5–8.1)

## 2022-04-13 LAB — URINALYSIS, DIPSTICK ONLY
Bilirubin Urine: NEGATIVE
Glucose, UA: NEGATIVE mg/dL
Ketones, ur: NEGATIVE mg/dL
Leukocytes,Ua: NEGATIVE
Nitrite: NEGATIVE
Protein, ur: 30 mg/dL — AB
Specific Gravity, Urine: 1.017 (ref 1.005–1.030)
pH: 5 (ref 5.0–8.0)

## 2022-04-13 LAB — CBC WITH DIFFERENTIAL/PLATELET
Abs Immature Granulocytes: 0.01 10*3/uL (ref 0.00–0.07)
Basophils Absolute: 0 10*3/uL (ref 0.0–0.1)
Basophils Relative: 1 %
Eosinophils Absolute: 0.1 10*3/uL (ref 0.0–0.5)
Eosinophils Relative: 2 %
HCT: 41 % (ref 36.0–46.0)
Hemoglobin: 13.9 g/dL (ref 12.0–15.0)
Immature Granulocytes: 0 %
Lymphocytes Relative: 30 %
Lymphs Abs: 1.4 10*3/uL (ref 0.7–4.0)
MCH: 32.3 pg (ref 26.0–34.0)
MCHC: 33.9 g/dL (ref 30.0–36.0)
MCV: 95.1 fL (ref 80.0–100.0)
Monocytes Absolute: 0.4 10*3/uL (ref 0.1–1.0)
Monocytes Relative: 8 %
Neutro Abs: 2.8 10*3/uL (ref 1.7–7.7)
Neutrophils Relative %: 59 %
Platelets: 140 10*3/uL — ABNORMAL LOW (ref 150–400)
RBC: 4.31 MIL/uL (ref 3.87–5.11)
RDW: 15.5 % (ref 11.5–15.5)
WBC: 4.8 10*3/uL (ref 4.0–10.5)
nRBC: 0 % (ref 0.0–0.2)

## 2022-04-13 LAB — MAGNESIUM: Magnesium: 2 mg/dL (ref 1.7–2.4)

## 2022-04-13 MED ORDER — SODIUM CHLORIDE 0.9 % IV SOLN
5.0000 mg/kg | Freq: Once | INTRAVENOUS | Status: AC
  Administered 2022-04-13: 400 mg via INTRAVENOUS
  Filled 2022-04-13: qty 16

## 2022-04-13 MED ORDER — FLUOROURACIL CHEMO INJECTION 2.5 GM/50ML
290.0000 mg/m2 | Freq: Once | INTRAVENOUS | Status: AC
  Administered 2022-04-13: 550 mg via INTRAVENOUS
  Filled 2022-04-13: qty 11

## 2022-04-13 MED ORDER — PALONOSETRON HCL INJECTION 0.25 MG/5ML
0.2500 mg | Freq: Once | INTRAVENOUS | Status: AC
  Administered 2022-04-13: 0.25 mg via INTRAVENOUS
  Filled 2022-04-13: qty 5

## 2022-04-13 MED ORDER — SODIUM CHLORIDE 0.9 % IV SOLN
320.0000 mg/m2 | Freq: Once | INTRAVENOUS | Status: AC
  Administered 2022-04-13: 628 mg via INTRAVENOUS
  Filled 2022-04-13: qty 31.4

## 2022-04-13 MED ORDER — SODIUM CHLORIDE 0.9 % IV SOLN: Freq: Once | INTRAVENOUS | Status: AC

## 2022-04-13 MED ORDER — GABAPENTIN 100 MG PO CAPS: ORAL_CAPSULE | ORAL | 3 refills | Status: DC

## 2022-04-13 MED ORDER — SODIUM CHLORIDE 0.9 % IV SOLN
10.0000 mg | Freq: Once | INTRAVENOUS | Status: AC
  Administered 2022-04-13: 10 mg via INTRAVENOUS
  Filled 2022-04-13: qty 10

## 2022-04-13 MED ORDER — ALTEPLASE 2 MG IJ SOLR
2.0000 mg | Freq: Once | INTRAMUSCULAR | Status: AC
  Administered 2022-04-13: 2 mg
  Filled 2022-04-13: qty 2

## 2022-04-13 MED ORDER — SODIUM CHLORIDE 0.9 % IV SOLN
3250.0000 mg | INTRAVENOUS | Status: DC
  Administered 2022-04-13: 3250 mg via INTRAVENOUS
  Filled 2022-04-13: qty 65

## 2022-04-13 NOTE — Patient Instructions (Signed)
[facility]  Discharge Instructions: Thank you for choosing [facility] to provide your oncology and hematology care.  If you have a lab appointment with the [facility], please come in thru the Main Entrance and check in at the main information desk.  Wear comfortable clothing and clothing appropriate for easy access to any Portacath or PICC line.   We strive to give you quality time with your provider. You may need to reschedule your appointment if you arrive late (15 or more minutes).  Arriving late affects you and other patients whose appointments are after yours.  Also, if you miss three or more appointments without notifying the office, you may be dismissed from the clinic at the provider's discretion.      For prescription refill requests, have your pharmacy contact our office and allow 72 hours for refills to be completed.    Today you received the following chemotherapy and/or immunotherapy agents, MVASI, 5FU, leucovorin, 5FU  ambulatory pump   To help prevent nausea and vomiting after your treatment, we encourage you to take your nausea medication as directed.  BELOW ARE SYMPTOMS THAT SHOULD BE REPORTED IMMEDIATELY: *FEVER GREATER THAN 100.4 F (38 C) OR HIGHER *CHILLS OR SWEATING *NAUSEA AND VOMITING THAT IS NOT CONTROLLED WITH YOUR NAUSEA MEDICATION *UNUSUAL SHORTNESS OF BREATH *UNUSUAL BRUISING OR BLEEDING *URINARY PROBLEMS (pain or burning when urinating, or frequent urination) *BOWEL PROBLEMS (unusual diarrhea, constipation, pain near the anus) TENDERNESS IN MOUTH AND THROAT WITH OR WITHOUT PRESENCE OF ULCERS (sore throat, sores in mouth, or a toothache) UNUSUAL RASH, SWELLING OR PAIN  UNUSUAL VAGINAL DISCHARGE OR ITCHING   Items with * indicate a potential emergency and should be followed up as soon as possible or go to the Emergency Department if any problems should occur.  Please show the CHEMOTHERAPY ALERT CARD or IMMUNOTHERAPY  ALERT CARD at check-in to the Emergency Department and triage nurse.  Should you have questions after your visit or need to cancel or reschedule your appointment, please contact [facility] [phone]  and follow the prompts.  Office hours are 8:00 a.m. to 4:30 p.m. Monday - Friday. Please note that voicemails left after 4:00 p.m. may not be returned until the following business day.  We are closed weekends and major holidays. You have access to a nurse at all times for urgent questions. Please call the main number to the clinic [phone] and follow the prompts.  For any non-urgent questions, you may also contact your provider using MyChart. We now offer e-Visits for anyone 42 and older to request care online for non-urgent symptoms. For details visit [email or web address].   Also download the MyChart app! Go to the app store, search "MyChart", open the app, select [facility], and log in with your MyChart username and password.  Masks are optional in the cancer centers. If you would like for your care team to wear a mask while they are taking care of you, please let them know. You may have one support person who is at least 62 years old accompany you for your appointments.

## 2022-04-13 NOTE — Progress Notes (Signed)
Maintain dose of:   Fluorouacil IVPush at 550 mg and Fluorouracil CIV at 3250 mg despite weight increase. All future plans updated.   T.O. Dr Rhys Martini, PharmD

## 2022-04-13 NOTE — Patient Instructions (Signed)
[facility] at [facility] Discharge Instructions   You were seen and examined today by Dr. Delton Coombes.  He reviewed the results of your lab work which are normal/stable.   We will proceed with your treatment today.   We will repeat a CT scan prior to your next visit.   Increase your Klonopin to 1 mg 3 times a day.   Return as scheduled.    Thank you for choosing [facility] at [facility] to provide your oncology and hematology care.  To afford each patient quality time with our provider, please arrive at least 15 minutes before your scheduled appointment time.   If you have a lab appointment with the [facility] please come in thru the Main Entrance and check in at the main information desk.  You need to re-schedule your appointment should you arrive 10 or more minutes late.  We strive to give you quality time with our providers, and arriving late affects you and other patients whose appointments are after yours.  Also, if you no show three or more times for appointments you may be dismissed from the clinic at the providers discretion.     Again, thank you for choosing [facility].  Our hope is that these requests will decrease the amount of time that you wait before being seen by our physicians.       _____________________________________________________________  Should you have questions after your visit to [facility] please contact our office at [phone] and follow the prompts.  Our office hours are 8:00 a.m. and 4:30 p.m. Monday - Friday.  Please note that voicemails left after 4:00 p.m. may not be returned until the following business day.  We are closed weekends and major holidays.  You do have access to a nurse 24-7, just call the main number to the clinic [phone] and do not press any options, hold on the line and a nurse will answer the phone.    For prescription refill requests, have your  pharmacy contact our office and allow 72 hours.    Due to Covid, you will need to wear a mask upon entering the hospital. If you do not have a mask, a mask will be given to you at the Main Entrance upon arrival. For doctor visits, patients may have 1 support person age 73 or older with them. For treatment visits, patients can not have anyone with them due to social distancing guidelines and our immunocompromised population.

## 2022-04-13 NOTE — Progress Notes (Signed)
Susan Martin,  [id number]   CLINIC:  Medical Oncology/Hematology  PCP:  Patient, No Pcp Per None None   REASON FOR VISIT:  Follow-up for metastatic colon cancer to liver  PRIOR THERAPY: Sigmoid colectomy and end colostomy on 04/29/2020  NGS Results: Foundation 1 KRAS wildtype, MS--stable, TMB 4 Muts/Mb  CURRENT THERAPY: Maintenance 5-FU and bevacizumab  BRIEF ONCOLOGIC HISTORY:  Oncology History  Metastatic colon cancer to liver ([place])  05/16/2020 Initial Diagnosis   Metastatic colon cancer to liver ([place])   06/03/2020 Genetic Testing   Foundation One:     06/04/2020 - 02/25/2022 Chemotherapy   Patient is on Treatment Plan : COLORECTAL FOLFOX + Bevacizumab q14d     06/04/2020 -  Chemotherapy   Patient is on Treatment Plan : COLORECTAL FOLFOX + Bevacizumab q14d       CANCER STAGING:  Cancer Staging  No matching staging information was found for the patient.  INTERVAL HISTORY:  Ms. Skarlette Lattner, a 61 y.o. female, seen for follow-up and toxicity assessment prior to next cycle of maintenance 5-FU and bevacizumab.  She reports energy levels of 75%.  Denied any bleeding issues.  Has occasional shortness of breath on exertion which is stable.  Reports that Klonopin twice daily is not helping as her anxiety is worse.  REVIEW OF SYSTEMS:  Review of Systems  Constitutional:  Negative for appetite change and fatigue.  HENT:   Negative for nosebleeds.   Respiratory:  Positive for shortness of breath (Occasional).   Gastrointestinal:  Negative for abdominal pain and diarrhea.  Hematological:  Does not bruise/bleed easily.  All other systems reviewed and are negative.   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 Delton Coombes   04-29-2020 [facility] surgery for perforated colon s/p sigmoid colectomy w/ colostomy; dx  Stage IV colon cancer  mets to liver   Anxiety    Depression    Hypertension    followed by dr Glo Herring and oncology  (05-27-2020 per pt does not have pcp yet)   IDA (iron deficiency anemia)    Pulmonary nodule, right    Past Surgical History:  Procedure Laterality Date   ABDOMINAL HYSTERECTOMY  03-31-2016   @[facility]    W/  BILATERAL SALPINOOPHORECTOMY    APPLICATION OF WOUND VAC N/A 04/29/2020   Procedure: APPLICATION OF WOUND VAC;  Surgeon: Mickeal Skinner, MD;  Location: [facility];  Service: General;  Laterality: N/A;   ENDOMETRIAL ABLATION     LAPAROTOMY N/A 04/29/2020   Procedure: EXPLORATORY LAPAROTOMY;  Surgeon: Mickeal Skinner, MD;  Location: [facility];  Service: General;  Laterality: N/A;   PARTIAL COLECTOMY N/A 04/29/2020   Procedure: PARTIAL COLECTOMY WITH END COLOSTOMY;  Surgeon: Kieth Brightly Arta Bruce, MD;  Location: [facility];  Service: General;  Laterality: N/A;   PORTACATH PLACEMENT Right 05/28/2020   Procedure: INSERTION PORT-A-CATH;  Surgeon: Kinsinger, Arta Bruce, MD;  Location: [facility];  Service: General;  Laterality: Right;   SALPINGOOPHORECTOMY Left 03/31/2016   Procedure: LEFT SALPINGO OOPHORECTOMY WITH FROZEN SECTION,  ABDOMINAL HYSTERECTOMY WITH BILATERAL SALPINGO-OOPHORECTOMY;  Surgeon: Jonnie Kind, MD;  Location: [facility];  Service: Gynecology;  Laterality: Left;    SOCIAL HISTORY:  Social History   Socioeconomic History   Marital status: Divorced    Spouse name: Not on file   Number of children: Not on file   Years of education: Not  on file   Highest education level: Not on file  Occupational History   Not on file  Tobacco Use   Smoking status: Former    Packs/day: 0.50    Years: 5.00    Total pack years: 2.50    Types: Cigarettes    Quit date: 03/28/2015    Years since quitting: 7.0   Smokeless tobacco: Never  Vaping Use   Vaping Use: Never used  Substance and Sexual Activity   Alcohol use: No   Drug use: Never   Sexual activity: Yes    Partners: Male     Birth control/protection: Surgical  Other Topics Concern   Not on file  Social History Narrative   Not on file   Social Determinants of Health   Financial Resource Strain: Low Risk  (11/01/2019)   Overall Financial Resource Strain (CARDIA)    Difficulty of Paying Living Expenses: Not very hard  Food Insecurity: No Food Insecurity (11/01/2019)   Hunger Vital Sign    Worried About Running Out of Food in the Last Year: Never true    Ran Out of Food in the Last Year: Never true  Transportation Needs: No Transportation Needs (11/01/2019)   PRAPARE - Hydrologist (Medical): No    Lack of Transportation (Non-Medical): No  Physical Activity: Insufficiently Active (11/01/2019)   Exercise Vital Sign    Days of Exercise per Week: 2 days    Minutes of Exercise per Session: 30 min  Stress: Stress Concern Present (11/01/2019)   [place]    Feeling of Stress : To some extent  Social Connections: Moderately Integrated (11/01/2019)   Social Connection and Isolation Panel [NHANES]    Frequency of Communication with Friends and Family: More than three times a week    Frequency of Social Gatherings with Friends and Family: Once a week    Attends Religious Services: More than 4 times per year    Active Member of [employer] or Organizations: No    Attends Archivist Meetings: Never    Marital Status: Living with partner  Intimate Partner Violence: Not At Risk (11/01/2019)   Humiliation, Afraid, Rape, and Kick questionnaire    Fear of Current or Ex-Partner: No    Emotionally Abused: No    Physically Abused: No    Sexually Abused: No    FAMILY HISTORY:  Family History  Problem Relation Age of Onset   Hypertension Mother    Diabetes Mother    Cirrhosis Father     CURRENT MEDICATIONS:  Current Outpatient Medications  Medication Sig Dispense Refill   amLODipine (NORVASC) 5 MG tablet Take 1 tablet (5  mg total) by mouth daily. 90 tablet 1   BEVACIZUMAB IV Inject 5 mg/kg into the vein every 14 (fourteen) days.     clonazePAM (KLONOPIN) 1 MG tablet Take 1 tablet (1 mg total) by mouth 2 (two) times daily. 60 tablet 3   fluorouracil CALGB 19417 in sodium chloride 0.9 % 150 mL Inject 2,400 mg/m2 into the vein over 48 hr.     LEUCOVORIN CALCIUM IV Inject 400 mg/m2 into the vein every 21 ( twenty-one) days.     lidocaine (XYLOCAINE) 2 % solution Use as directed 15 mLs in the mouth or throat every 6 (six) hours as needed for mouth pain. Swish and spit/swallow every six hours as needed for mouth pain 480 mL 3   LORazepam (ATIVAN) 1 MG tablet Take  1 tablet (1 mg total) by mouth every 6 (six) hours as needed. for anxiety 120 tablet 1   nystatin (MYCOSTATIN) 100000 UNIT/ML suspension Take by mouth.     PARoxetine (PAXIL) 40 MG tablet Take 1 tablet (40 mg total) by mouth every morning. 90 tablet 3   polyethylene glycol (MIRALAX / GLYCOLAX) packet Take 17 g by mouth every other day.     potassium chloride (KLOR-CON M) 10 MEQ tablet Take 2 tablets (20 mEq total) by mouth 3 (three) times daily. 90 tablet 6   triamterene-hydrochlorothiazide (MAXZIDE-25) 37.5-25 MG tablet TAKE 1 TABLET BY MOUTH EVERY DAY 90 tablet 1   gabapentin (NEURONTIN) 100 MG capsule TAKE 1 CAPSULE (100 MG TOTAL) BY MOUTH THREE TIMES DAILY. 90 capsule 3   No current facility-administered medications for this visit.    ALLERGIES:  No Known Allergies  PHYSICAL EXAM:  Performance status (ECOG): 1 - Symptomatic but completely ambulatory  There were no vitals filed for this visit.  Wt Readings from Last 3 Encounters:  04/13/22 187 lb 9.6 oz (85.1 kg)  03/30/22 184 lb (83.5 kg)  03/09/22 183 lb 12.8 oz (83.4 kg)   Physical Exam Vitals reviewed.  Constitutional:      Appearance: Normal appearance.  Cardiovascular:     Rate and Rhythm: Normal rate and regular rhythm.     Pulses: Normal pulses.     Heart sounds: Normal heart  sounds.  Pulmonary:     Effort: Pulmonary effort is normal.     Breath sounds: Normal breath sounds.  Abdominal:     Palpations: Abdomen is soft. There is no mass.     Tenderness: There is no abdominal tenderness.  Musculoskeletal:     Right lower leg: No edema.     Left lower leg: No edema.  Neurological:     General: No focal deficit present.     Mental Status: She is alert and oriented to person, place, and time.  Psychiatric:        Mood and Affect: Mood normal.        Behavior: Behavior normal.     LABORATORY DATA:  I have reviewed the labs as listed.     Latest Ref Rng & Units 04/13/2022    8:47 AM 03/30/2022    8:28 AM 03/09/2022    9:10 AM  CBC  WBC 4.0 - 10.5 K/uL 4.8  4.8  5.4   Hemoglobin 12.0 - 15.0 g/dL 13.9  13.8  13.2   Hematocrit 36.0 - 46.0 % 41.0  40.9  39.6   Platelets 150 - 400 K/uL 140  168  136       Latest Ref Rng & Units 04/13/2022    8:47 AM 03/30/2022    8:28 AM 03/09/2022    9:10 AM  CMP  Glucose 70 - 99 mg/dL 143  167  155   BUN 8 - 23 mg/dL 12  15  17    Creatinine 0.44 - 1.00 mg/dL 1.00  0.99  1.00   Sodium 135 - 145 mmol/L 136  134  136   Potassium 3.5 - 5.1 mmol/L 3.4  3.3  3.6   Chloride 98 - 111 mmol/L 102  102  101   CO2 22 - 32 mmol/L 25  24  26    Calcium 8.9 - 10.3 mg/dL 9.0  8.9  8.6   Total Protein 6.5 - 8.1 g/dL 7.3  7.5  7.1   Total Bilirubin 0.3 - 1.2 mg/dL 0.6  1.3  0.6   Alkaline Phos 38 - 126 U/L 74  82  76   AST 15 - 41 U/L 34  34  30   ALT 0 - 44 U/L 32  28  29     DIAGNOSTIC IMAGING:  I have independently reviewed the scans and discussed with the patient. No results found.   ASSESSMENT:  1.  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 -CT chest on 05/02/2020 with no evidence of pulmonary  metastatic disease. -CTAP on 05/07/2020 with multiple lesions throughout the liver, largest in the right lobe measuring 3.9 cm, lateral segment left lobe measuring 2.6 cm and inferior right lobe confluent lesion 4.2 x 3.7 cm.  No lymphadenopathy. -CEA on 05/02/2020-60.2. -FOLFOX and bevacizumab started on 06/04/2020. 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   2.  Iron deficiency anemia: -Feraheme on 04/30/2020 and 05/06/2020.   3.  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 -Smoked for 3 to 4 years and quit. -Mother had cancer, type unknown.  Maternal uncle had lung cancer.  Maternal aunt had lung cancer.   PLAN:  1.  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 - Reviewed labs today which showed normal LFTs and CBC.  Mild thrombocytopenia stable. - Proceed with maintenance 5-FU and bevacizumab today and in 2 weeks.  RTC 4 weeks for follow-up with repeat CT CAP and CEA.   2.  Hypertension: - Continue triamterene/HCTZ and Norvasc 5 mg daily.   3.  Severe hypokalemia: - Continue potassium 10 mEq 3 tablets daily.   4.  Peripheral neuropathy: - Continue gabapentin 100 mg in the mornings.   5.  Anxiety: - Anxiety is slightly worse.  Continue Paxil.  Will increase Klonopin to 1 mg 3 times daily.  6.  Mucositis: - Continue Vaseline and lidocaine gel as needed.   Orders placed this encounter:  Orders Placed This Encounter  Procedures   CT CHEST [place], [place] [phone]

## 2022-04-13 NOTE — Progress Notes (Signed)
Labs reviewed by MD today. Ok to proceed with treatment today per MD. Alteplase administered by previous RN per protocol.   Able to withdraw 82ms of blood from port a cath post alteplase and flushed with 170mof NS. Proceed as planned.    Treatment given per orders. Patient tolerated it well without problems. Vitals stable and discharged home from clinic ambulatory. Follow up as scheduled.

## 2022-04-13 NOTE — Progress Notes (Signed)
Patient has been examined by Dr. Katragadda, and vital signs and labs have been reviewed. ANC, Creatinine, LFTs, hemoglobin, and platelets are within treatment parameters per M.D. - pt may proceed with treatment.  Primary RN and pharmacy notified.  

## 2022-04-15 ENCOUNTER — Other Ambulatory Visit (HOSPITAL_COMMUNITY): Payer: Self-pay | Admitting: Hematology

## 2022-04-15 ENCOUNTER — Other Ambulatory Visit: Payer: Self-pay

## 2022-04-15 ENCOUNTER — Inpatient Hospital Stay: Payer: Medicaid Other

## 2022-04-15 VITALS — BP 143/84 | HR 70 | Temp 97.0°F | Resp 16

## 2022-04-15 DIAGNOSIS — F419 Anxiety disorder, unspecified: Secondary | ICD-10-CM

## 2022-04-15 DIAGNOSIS — C787 Secondary malignant neoplasm of liver and intrahepatic bile duct: Secondary | ICD-10-CM

## 2022-04-15 DIAGNOSIS — Z5111 Encounter for antineoplastic chemotherapy: Secondary | ICD-10-CM | POA: Diagnosis not present

## 2022-04-15 MED ORDER — CLONAZEPAM 1 MG PO TABS: 1.0000 mg | ORAL_TABLET | Freq: Three times a day (TID) | ORAL | 3 refills | Status: DC

## 2022-04-15 MED ORDER — SODIUM CHLORIDE 0.9% FLUSH
10.0000 mL | INTRAVENOUS | Status: DC | PRN
  Administered 2022-04-15: 10 mL

## 2022-04-15 MED ORDER — HEPARIN SOD (PORK) LOCK FLUSH 100 UNIT/ML IV SOLN
500.0000 [IU] | Freq: Once | INTRAVENOUS | Status: AC | PRN
  Administered 2022-04-15: 500 [IU]

## 2022-04-15 NOTE — Patient Instructions (Signed)
Your pump was d/c today. Follow up in 2 weeks as scheduled.

## 2022-04-15 NOTE — Progress Notes (Signed)
Patient was here today for pump d/c.  She denies any issues with her pump over the last 46 hours.  She denies any s/s during this time as well.  She did ask for a refill on her clonazepam and that was sent to Dr. Delton Coombes.  Her pump is stopped and all the volume was infused with zero ml remaining. Her pump was d/c and port deaccessed without incidence.  She remained stable during treatment.  Patient discharged ambulatory and in stable condition from clinic.  She got an updated copy of her schedule.

## 2022-04-16 ENCOUNTER — Other Ambulatory Visit: Payer: Self-pay

## 2022-04-20 ENCOUNTER — Other Ambulatory Visit: Payer: Medicaid Other

## 2022-04-20 ENCOUNTER — Ambulatory Visit: Payer: Medicaid Other

## 2022-04-21 ENCOUNTER — Other Ambulatory Visit: Payer: Medicaid Other

## 2022-04-21 ENCOUNTER — Ambulatory Visit: Payer: Medicaid Other | Admitting: Hematology

## 2022-04-21 ENCOUNTER — Ambulatory Visit: Payer: Medicaid Other

## 2022-04-22 ENCOUNTER — Other Ambulatory Visit: Payer: Self-pay

## 2022-04-27 ENCOUNTER — Other Ambulatory Visit: Payer: Medicaid Other

## 2022-04-27 ENCOUNTER — Ambulatory Visit: Payer: Medicaid Other

## 2022-04-29 ENCOUNTER — Other Ambulatory Visit: Payer: Self-pay

## 2022-05-04 ENCOUNTER — Inpatient Hospital Stay: Payer: Medicaid Other | Attending: Hematology

## 2022-05-04 ENCOUNTER — Ambulatory Visit: Payer: Medicaid Other

## 2022-05-04 ENCOUNTER — Other Ambulatory Visit: Payer: Medicaid Other

## 2022-05-04 ENCOUNTER — Ambulatory Visit: Payer: Medicaid Other | Admitting: Hematology

## 2022-05-04 ENCOUNTER — Inpatient Hospital Stay: Payer: Medicaid Other

## 2022-05-04 VITALS — BP 141/85 | HR 80 | Temp 98.6°F | Resp 18 | Wt 187.2 lb

## 2022-05-04 DIAGNOSIS — K1230 Oral mucositis (ulcerative), unspecified: Secondary | ICD-10-CM | POA: Insufficient documentation

## 2022-05-04 DIAGNOSIS — I10 Essential (primary) hypertension: Secondary | ICD-10-CM | POA: Diagnosis not present

## 2022-05-04 DIAGNOSIS — C787 Secondary malignant neoplasm of liver and intrahepatic bile duct: Secondary | ICD-10-CM | POA: Diagnosis not present

## 2022-05-04 DIAGNOSIS — C189 Malignant neoplasm of colon, unspecified: Secondary | ICD-10-CM | POA: Insufficient documentation

## 2022-05-04 DIAGNOSIS — G629 Polyneuropathy, unspecified: Secondary | ICD-10-CM | POA: Diagnosis not present

## 2022-05-04 DIAGNOSIS — Z87891 Personal history of nicotine dependence: Secondary | ICD-10-CM | POA: Diagnosis not present

## 2022-05-04 DIAGNOSIS — Z809 Family history of malignant neoplasm, unspecified: Secondary | ICD-10-CM | POA: Insufficient documentation

## 2022-05-04 DIAGNOSIS — E876 Hypokalemia: Secondary | ICD-10-CM | POA: Diagnosis not present

## 2022-05-04 DIAGNOSIS — D509 Iron deficiency anemia, unspecified: Secondary | ICD-10-CM | POA: Diagnosis not present

## 2022-05-04 DIAGNOSIS — F419 Anxiety disorder, unspecified: Secondary | ICD-10-CM | POA: Insufficient documentation

## 2022-05-04 DIAGNOSIS — Z95828 Presence of other vascular implants and grafts: Secondary | ICD-10-CM

## 2022-05-04 DIAGNOSIS — Z801 Family history of malignant neoplasm of trachea, bronchus and lung: Secondary | ICD-10-CM | POA: Insufficient documentation

## 2022-05-04 DIAGNOSIS — Z5111 Encounter for antineoplastic chemotherapy: Secondary | ICD-10-CM | POA: Diagnosis present

## 2022-05-04 DIAGNOSIS — Z79899 Other long term (current) drug therapy: Secondary | ICD-10-CM | POA: Diagnosis not present

## 2022-05-04 LAB — CBC WITH DIFFERENTIAL/PLATELET
Abs Immature Granulocytes: 0.03 10*3/uL (ref 0.00–0.07)
Basophils Absolute: 0 10*3/uL (ref 0.0–0.1)
Basophils Relative: 1 %
Eosinophils Absolute: 0.1 10*3/uL (ref 0.0–0.5)
Eosinophils Relative: 2 %
HCT: 40 % (ref 36.0–46.0)
Hemoglobin: 13.3 g/dL (ref 12.0–15.0)
Immature Granulocytes: 1 %
Lymphocytes Relative: 28 %
Lymphs Abs: 1.4 10*3/uL (ref 0.7–4.0)
MCH: 31.1 pg (ref 26.0–34.0)
MCHC: 33.3 g/dL (ref 30.0–36.0)
MCV: 93.7 fL (ref 80.0–100.0)
Monocytes Absolute: 0.6 10*3/uL (ref 0.1–1.0)
Monocytes Relative: 13 %
Neutro Abs: 2.8 10*3/uL (ref 1.7–7.7)
Neutrophils Relative %: 55 %
Platelets: 170 10*3/uL (ref 150–400)
RBC: 4.27 MIL/uL (ref 3.87–5.11)
RDW: 15.7 % — ABNORMAL HIGH (ref 11.5–15.5)
WBC: 5.1 10*3/uL (ref 4.0–10.5)
nRBC: 0 % (ref 0.0–0.2)

## 2022-05-04 LAB — URINALYSIS, DIPSTICK ONLY
Bilirubin Urine: NEGATIVE
Glucose, UA: NEGATIVE mg/dL
Hgb urine dipstick: NEGATIVE
Ketones, ur: NEGATIVE mg/dL
Leukocytes,Ua: NEGATIVE
Nitrite: NEGATIVE
Protein, ur: 30 mg/dL — AB
Specific Gravity, Urine: 1.015 (ref 1.005–1.030)
pH: 6 (ref 5.0–8.0)

## 2022-05-04 LAB — COMPREHENSIVE METABOLIC PANEL
ALT: 39 U/L (ref 0–44)
AST: 44 U/L — ABNORMAL HIGH (ref 15–41)
Albumin: 3.5 g/dL (ref 3.5–5.0)
Alkaline Phosphatase: 95 U/L (ref 38–126)
Anion gap: 8 (ref 5–15)
BUN: 11 mg/dL (ref 8–23)
CO2: 25 mmol/L (ref 22–32)
Calcium: 8.6 mg/dL — ABNORMAL LOW (ref 8.9–10.3)
Chloride: 102 mmol/L (ref 98–111)
Creatinine, Ser: 0.92 mg/dL (ref 0.44–1.00)
GFR, Estimated: >60 mL/min (ref >60)
Glucose, Bld: 159 mg/dL — ABNORMAL HIGH (ref 70–99)
Potassium: 3.3 mmol/L — ABNORMAL LOW (ref 3.5–5.1)
Sodium: 135 mmol/L (ref 135–145)
Total Bilirubin: 0.9 mg/dL (ref 0.3–1.2)
Total Protein: 7.2 g/dL (ref 6.5–8.1)

## 2022-05-04 LAB — MAGNESIUM: Magnesium: 2 mg/dL (ref 1.7–2.4)

## 2022-05-04 MED ORDER — PALONOSETRON HCL INJECTION 0.25 MG/5ML
0.2500 mg | Freq: Once | INTRAVENOUS | Status: AC
  Administered 2022-05-04: 0.25 mg via INTRAVENOUS
  Filled 2022-05-04: qty 5

## 2022-05-04 MED ORDER — SODIUM CHLORIDE 0.9% FLUSH: 10.0000 mL | INTRAVENOUS | Status: DC | PRN

## 2022-05-04 MED ORDER — SODIUM CHLORIDE 0.9 % IV SOLN: Freq: Once | INTRAVENOUS | Status: AC

## 2022-05-04 MED ORDER — SODIUM CHLORIDE 0.9 % IV SOLN
10.0000 mg | Freq: Once | INTRAVENOUS | Status: AC
  Administered 2022-05-04: 10 mg via INTRAVENOUS
  Filled 2022-05-04: qty 10

## 2022-05-04 MED ORDER — LEUCOVORIN CALCIUM INJECTION 350 MG
320.0000 mg/m2 | Freq: Once | INTRAVENOUS | Status: AC
  Administered 2022-05-04: 628 mg via INTRAVENOUS
  Filled 2022-05-04: qty 31.4

## 2022-05-04 MED ORDER — SODIUM CHLORIDE 0.9 % IV SOLN
5.0000 mg/kg | Freq: Once | INTRAVENOUS | Status: AC
  Administered 2022-05-04: 400 mg via INTRAVENOUS
  Filled 2022-05-04: qty 16

## 2022-05-04 MED ORDER — FLUOROURACIL CHEMO INJECTION 2.5 GM/50ML
270.0000 mg/m2 | Freq: Once | INTRAVENOUS | Status: AC
  Administered 2022-05-04: 550 mg via INTRAVENOUS
  Filled 2022-05-04: qty 11

## 2022-05-04 MED ORDER — SODIUM CHLORIDE 0.9% FLUSH
10.0000 mL | Freq: Once | INTRAVENOUS | Status: AC
  Administered 2022-05-04: 10 mL via INTRAVENOUS

## 2022-05-04 MED ORDER — HEPARIN SOD (PORK) LOCK FLUSH 100 UNIT/ML IV SOLN: 500.0000 [IU] | Freq: Once | INTRAVENOUS | Status: DC | PRN

## 2022-05-04 MED ORDER — SODIUM CHLORIDE 0.9 % IV SOLN
3250.0000 mg | INTRAVENOUS | Status: DC
  Administered 2022-05-04: 3250 mg via INTRAVENOUS
  Filled 2022-05-04: qty 65

## 2022-05-04 MED ORDER — LEUCOVORIN CALCIUM INJECTION 350 MG
320.0000 mg/m2 | Freq: Once | INTRAVENOUS | Status: DC
  Filled 2022-05-04: qty 31.4

## 2022-05-04 NOTE — Progress Notes (Signed)
Patient presents today for Avastin/Leucovorin/Fluorouracil/ with pump start.  Vital signs within parameters for treatment.  Labs pending.  Patient has no new complaints at this time.  Treatment given today per MD orders.  Stable during infusion without adverse affects.  5FU pump connected and verified RUN on the screen with the patient.  Vital signs stable.  No complaints at this time.  Discharge from clinic ambulatory in stable condition.  Alert and oriented X 3.  Follow up with [facility] as scheduled.

## 2022-05-04 NOTE — Patient Instructions (Signed)
[facility]  Discharge Instructions: Thank you for choosing [facility] to provide your oncology and hematology care.  If you have a lab appointment with the [facility], please come in thru the Main Entrance and check in at the main information desk.  Wear comfortable clothing and clothing appropriate for easy access to any Portacath or PICC line.   We strive to give you quality time with your provider. You may need to reschedule your appointment if you arrive late (15 or more minutes).  Arriving late affects you and other patients whose appointments are after yours.  Also, if you miss three or more appointments without notifying the office, you may be dismissed from the clinic at the provider's discretion.      For prescription refill requests, have your pharmacy contact our office and allow 72 hours for refills to be completed.    Today you received the following chemotherapy and/or immunotherapy agents Leucovorin/ avastin Fluorouracil 5FU pump start      To help prevent nausea and vomiting after your treatment, we encourage you to take your nausea medication as directed.  BELOW ARE SYMPTOMS THAT SHOULD BE REPORTED IMMEDIATELY: *FEVER GREATER THAN 100.4 F (38 C) OR HIGHER *CHILLS OR SWEATING *NAUSEA AND VOMITING THAT IS NOT CONTROLLED WITH YOUR NAUSEA MEDICATION *UNUSUAL SHORTNESS OF BREATH *UNUSUAL BRUISING OR BLEEDING *URINARY PROBLEMS (pain or burning when urinating, or frequent urination) *BOWEL PROBLEMS (unusual diarrhea, constipation, pain near the anus) TENDERNESS IN MOUTH AND THROAT WITH OR WITHOUT PRESENCE OF ULCERS (sore throat, sores in mouth, or a toothache) UNUSUAL RASH, SWELLING OR PAIN  UNUSUAL VAGINAL DISCHARGE OR ITCHING   Items with * indicate a potential emergency and should be followed up as soon as possible or go to the Emergency Department if any problems should occur.  Please show the CHEMOTHERAPY ALERT CARD or IMMUNOTHERAPY  ALERT CARD at check-in to the Emergency Department and triage nurse.  Should you have questions after your visit or need to cancel or reschedule your appointment, please contact [facility] [phone]  and follow the prompts.  Office hours are 8:00 a.m. to 4:30 p.m. Monday - Friday. Please note that voicemails left after 4:00 p.m. may not be returned until the following business day.  We are closed weekends and major holidays. You have access to a nurse at all times for urgent questions. Please call the main number to the clinic [phone] and follow the prompts.  For any non-urgent questions, you may also contact your provider using MyChart. We now offer e-Visits for anyone 84 and older to request care online for non-urgent symptoms. For details visit [email or web address].   Also download the MyChart app! Go to the app store, search "MyChart", open the app, select [facility], and log in with your MyChart username and password.  Masks are optional in the cancer centers. If you would like for your care team to wear a mask while they are taking care of you, please let them know. You may have one support person who is at least 62 years old accompany you for your appointments.

## 2022-05-05 LAB — CEA: CEA: 2.8 ng/mL (ref 0.0–4.7)

## 2022-05-06 ENCOUNTER — Other Ambulatory Visit: Payer: Self-pay | Admitting: *Deleted

## 2022-05-06 ENCOUNTER — Inpatient Hospital Stay: Payer: Medicaid Other

## 2022-05-06 VITALS — BP 156/90 | HR 75 | Temp 98.8°F | Resp 18

## 2022-05-06 DIAGNOSIS — Z5111 Encounter for antineoplastic chemotherapy: Secondary | ICD-10-CM | POA: Diagnosis not present

## 2022-05-06 DIAGNOSIS — F419 Anxiety disorder, unspecified: Secondary | ICD-10-CM

## 2022-05-06 DIAGNOSIS — C787 Secondary malignant neoplasm of liver and intrahepatic bile duct: Secondary | ICD-10-CM

## 2022-05-06 MED ORDER — HEPARIN SOD (PORK) LOCK FLUSH 100 UNIT/ML IV SOLN
500.0000 [IU] | Freq: Once | INTRAVENOUS | Status: AC | PRN
  Administered 2022-05-06: 500 [IU]

## 2022-05-06 MED ORDER — SODIUM CHLORIDE 0.9% FLUSH
10.0000 mL | INTRAVENOUS | Status: DC | PRN
  Administered 2022-05-06: 10 mL

## 2022-05-06 NOTE — Patient Instructions (Signed)
[facility]  Discharge Instructions: Thank you for choosing [facility] to provide your oncology and hematology care.  If you have a lab appointment with the [facility], please come in thru the Main Entrance and check in at the main information desk.  Wear comfortable clothing and clothing appropriate for easy access to any Portacath or PICC line.   We strive to give you quality time with your provider. You may need to reschedule your appointment if you arrive late (15 or more minutes).  Arriving late affects you and other patients whose appointments are after yours.  Also, if you miss three or more appointments without notifying the office, you may be dismissed from the clinic at the provider's discretion.      For prescription refill requests, have your pharmacy contact our office and allow 72 hours for refills to be completed.    Today you received the following chemotherapy and/or immunotherapy agents Pump stop      To help prevent nausea and vomiting after your treatment, we encourage you to take your nausea medication as directed.  BELOW ARE SYMPTOMS THAT SHOULD BE REPORTED IMMEDIATELY: *FEVER GREATER THAN 100.4 F (38 C) OR HIGHER *CHILLS OR SWEATING *NAUSEA AND VOMITING THAT IS NOT CONTROLLED WITH YOUR NAUSEA MEDICATION *UNUSUAL SHORTNESS OF BREATH *UNUSUAL BRUISING OR BLEEDING *URINARY PROBLEMS (pain or burning when urinating, or frequent urination) *BOWEL PROBLEMS (unusual diarrhea, constipation, pain near the anus) TENDERNESS IN MOUTH AND THROAT WITH OR WITHOUT PRESENCE OF ULCERS (sore throat, sores in mouth, or a toothache) UNUSUAL RASH, SWELLING OR PAIN  UNUSUAL VAGINAL DISCHARGE OR ITCHING   Items with * indicate a potential emergency and should be followed up as soon as possible or go to the Emergency Department if any problems should occur.  Please show the CHEMOTHERAPY ALERT CARD or IMMUNOTHERAPY ALERT CARD at check-in to the  Emergency Department and triage nurse.  Should you have questions after your visit or need to cancel or reschedule your appointment, please contact [facility] [phone]  and follow the prompts.  Office hours are 8:00 a.m. to 4:30 p.m. Monday - Friday. Please note that voicemails left after 4:00 p.m. may not be returned until the following business day.  We are closed weekends and major holidays. You have access to a nurse at all times for urgent questions. Please call the main number to the clinic [phone] and follow the prompts.  For any non-urgent questions, you may also contact your provider using MyChart. We now offer e-Visits for anyone 98 and older to request care online for non-urgent symptoms. For details visit [email or web address].   Also download the MyChart app! Go to the app store, search "MyChart", open the app, select [facility], and log in with your MyChart username and password.  Masks are optional in the cancer centers. If you would like for your care team to wear a mask while they are taking care of you, please let them know. You may have one support person who is at least 62 years old accompany you for your appointments.

## 2022-05-06 NOTE — Progress Notes (Signed)
Patient presents today for 5FU pump stop and disconnection after 46 hour continous infusion.   5FU pump deaccessed.  Patients port flushed without difficulty.  Good blood return noted with no bruising or swelling noted at site.  Needle removed intact.  Band aid applied.  VSS with discharge and left in satisfactory condition via wheelchair with no s/s of distress noted.    

## 2022-05-07 ENCOUNTER — Ambulatory Visit (HOSPITAL_COMMUNITY)
Admission: RE | Admit: 2022-05-07 | Discharge: 2022-05-07 | Disposition: A | Discharge status: Home / Self Care | Payer: Medicaid Other | Source: Ambulatory Visit | Attending: Hematology | Admitting: Hematology

## 2022-05-07 DIAGNOSIS — C189 Malignant neoplasm of colon, unspecified: Secondary | ICD-10-CM | POA: Insufficient documentation

## 2022-05-07 DIAGNOSIS — C787 Secondary malignant neoplasm of liver and intrahepatic bile duct: Secondary | ICD-10-CM | POA: Insufficient documentation

## 2022-05-07 MED ORDER — IOHEXOL 9 MG/ML PO SOLN
ORAL | Status: AC
  Filled 2022-05-07: qty 1000

## 2022-05-07 MED ORDER — IOHEXOL 300 MG/ML  SOLN
100.0000 mL | Freq: Once | INTRAMUSCULAR | Status: AC | PRN
  Administered 2022-05-07: 100 mL via INTRAVENOUS

## 2022-05-11 ENCOUNTER — Ambulatory Visit: Payer: Medicaid Other | Admitting: Hematology

## 2022-05-11 ENCOUNTER — Ambulatory Visit: Payer: Medicaid Other

## 2022-05-11 ENCOUNTER — Other Ambulatory Visit: Payer: Medicaid Other

## 2022-05-12 ENCOUNTER — Other Ambulatory Visit (HOSPITAL_COMMUNITY): Payer: Self-pay | Admitting: Hematology

## 2022-05-12 ENCOUNTER — Ambulatory Visit (HOSPITAL_COMMUNITY): Payer: Medicaid Other

## 2022-05-18 ENCOUNTER — Ambulatory Visit: Payer: Medicaid Other | Admitting: Hematology

## 2022-05-18 ENCOUNTER — Ambulatory Visit: Payer: Medicaid Other

## 2022-05-18 ENCOUNTER — Other Ambulatory Visit: Payer: Medicaid Other

## 2022-05-25 ENCOUNTER — Other Ambulatory Visit: Payer: Medicaid Other

## 2022-05-25 ENCOUNTER — Ambulatory Visit: Payer: Medicaid Other | Admitting: Hematology

## 2022-05-25 ENCOUNTER — Ambulatory Visit: Payer: Medicaid Other

## 2022-05-27 ENCOUNTER — Inpatient Hospital Stay (HOSPITAL_BASED_OUTPATIENT_CLINIC_OR_DEPARTMENT_OTHER): Payer: Medicaid Other | Admitting: Hematology

## 2022-05-27 ENCOUNTER — Inpatient Hospital Stay: Payer: Medicaid Other

## 2022-05-27 VITALS — BP 160/92 | HR 81 | Temp 97.4°F | Resp 17

## 2022-05-27 VITALS — BP 149/79 | HR 85 | Temp 98.4°F | Resp 18 | Ht 63.0 in | Wt 186.5 lb

## 2022-05-27 DIAGNOSIS — C787 Secondary malignant neoplasm of liver and intrahepatic bile duct: Secondary | ICD-10-CM

## 2022-05-27 DIAGNOSIS — C189 Malignant neoplasm of colon, unspecified: Secondary | ICD-10-CM

## 2022-05-27 DIAGNOSIS — Z5111 Encounter for antineoplastic chemotherapy: Secondary | ICD-10-CM | POA: Diagnosis not present

## 2022-05-27 DIAGNOSIS — Z95828 Presence of other vascular implants and grafts: Secondary | ICD-10-CM

## 2022-05-27 LAB — COMPREHENSIVE METABOLIC PANEL
ALT: 34 U/L (ref 0–44)
AST: 44 U/L — ABNORMAL HIGH (ref 15–41)
Albumin: 3.7 g/dL (ref 3.5–5.0)
Alkaline Phosphatase: 101 U/L (ref 38–126)
Anion gap: 13 (ref 5–15)
BUN: 11 mg/dL (ref 8–23)
CO2: 23 mmol/L (ref 22–32)
Calcium: 9.1 mg/dL (ref 8.9–10.3)
Chloride: 100 mmol/L (ref 98–111)
Creatinine, Ser: 0.87 mg/dL (ref 0.44–1.00)
GFR, Estimated: >60 mL/min (ref >60)
Glucose, Bld: 154 mg/dL — ABNORMAL HIGH (ref 70–99)
Potassium: 3.5 mmol/L (ref 3.5–5.1)
Sodium: 136 mmol/L (ref 135–145)
Total Bilirubin: 0.7 mg/dL (ref 0.3–1.2)
Total Protein: 7.7 g/dL (ref 6.5–8.1)

## 2022-05-27 LAB — CBC WITH DIFFERENTIAL/PLATELET
Abs Immature Granulocytes: 0.03 10*3/uL (ref 0.00–0.07)
Basophils Absolute: 0.1 10*3/uL (ref 0.0–0.1)
Basophils Relative: 1 %
Eosinophils Absolute: 0.2 10*3/uL (ref 0.0–0.5)
Eosinophils Relative: 3 %
HCT: 42.3 % (ref 36.0–46.0)
Hemoglobin: 14.2 g/dL (ref 12.0–15.0)
Immature Granulocytes: 1 %
Lymphocytes Relative: 30 %
Lymphs Abs: 1.8 10*3/uL (ref 0.7–4.0)
MCH: 30.9 pg (ref 26.0–34.0)
MCHC: 33.6 g/dL (ref 30.0–36.0)
MCV: 92.2 fL (ref 80.0–100.0)
Monocytes Absolute: 0.7 10*3/uL (ref 0.1–1.0)
Monocytes Relative: 11 %
Neutro Abs: 3.2 10*3/uL (ref 1.7–7.7)
Neutrophils Relative %: 54 %
Platelets: 169 10*3/uL (ref 150–400)
RBC: 4.59 MIL/uL (ref 3.87–5.11)
RDW: 15.5 % (ref 11.5–15.5)
WBC: 6 10*3/uL (ref 4.0–10.5)
nRBC: 0 % (ref 0.0–0.2)

## 2022-05-27 LAB — URINALYSIS, DIPSTICK ONLY
Bilirubin Urine: NEGATIVE
Glucose, UA: NEGATIVE mg/dL
Hgb urine dipstick: NEGATIVE
Ketones, ur: NEGATIVE mg/dL
Leukocytes,Ua: NEGATIVE
Nitrite: NEGATIVE
Protein, ur: 100 mg/dL — AB
Specific Gravity, Urine: 1.014 (ref 1.005–1.030)
pH: 5 (ref 5.0–8.0)

## 2022-05-27 LAB — MAGNESIUM: Magnesium: 2 mg/dL (ref 1.7–2.4)

## 2022-05-27 MED ORDER — PALONOSETRON HCL INJECTION 0.25 MG/5ML
0.2500 mg | Freq: Once | INTRAVENOUS | Status: AC
  Administered 2022-05-27: 0.25 mg via INTRAVENOUS
  Filled 2022-05-27: qty 5

## 2022-05-27 MED ORDER — LEUCOVORIN CALCIUM INJECTION 350 MG
320.0000 mg/m2 | Freq: Once | INTRAVENOUS | Status: AC
  Administered 2022-05-27: 628 mg via INTRAVENOUS
  Filled 2022-05-27: qty 31.4

## 2022-05-27 MED ORDER — DEXTROSE 5 % IV SOLN: Freq: Once | INTRAVENOUS | Status: DC

## 2022-05-27 MED ORDER — SODIUM CHLORIDE 0.9% FLUSH
10.0000 mL | Freq: Once | INTRAVENOUS | Status: AC
  Administered 2022-05-27: 10 mL via INTRAVENOUS

## 2022-05-27 MED ORDER — FLUOROURACIL CHEMO INJECTION 2.5 GM/50ML
270.0000 mg/m2 | Freq: Once | INTRAVENOUS | Status: AC
  Administered 2022-05-27: 550 mg via INTRAVENOUS
  Filled 2022-05-27: qty 11

## 2022-05-27 MED ORDER — SODIUM CHLORIDE 0.9 % IV SOLN
3250.0000 mg | INTRAVENOUS | Status: DC
  Administered 2022-05-27: 3250 mg via INTRAVENOUS
  Filled 2022-05-27: qty 65

## 2022-05-27 MED ORDER — SODIUM CHLORIDE 0.9 % IV SOLN: Freq: Once | INTRAVENOUS | Status: AC

## 2022-05-27 MED ORDER — SODIUM CHLORIDE 0.9 % IV SOLN
5.0000 mg/kg | Freq: Once | INTRAVENOUS | Status: AC
  Administered 2022-05-27: 400 mg via INTRAVENOUS
  Filled 2022-05-27: qty 16

## 2022-05-27 MED ORDER — SODIUM CHLORIDE 0.9 % IV SOLN
10.0000 mg | Freq: Once | INTRAVENOUS | Status: AC
  Administered 2022-05-27: 10 mg via INTRAVENOUS
  Filled 2022-05-27: qty 10

## 2022-05-27 NOTE — Progress Notes (Signed)
Patient arrived to cancer center with port needle removed from port. Chemo line clamped. Patient's skin was red from previous dressing, not burning or pain at site. Patient's skin washed with soap and water. Patient's port reassessed and chemo restarted with 2 Rns verified pump. Patient left in satisfactory condition

## 2022-05-27 NOTE — Patient Instructions (Signed)
[facility] at [facility] Discharge Instructions   You were seen and examined today by Dr. Delton Coombes.  He reviewed the results of your CT scan which is good. The cancer in the liver is better.   He reviewed the results of your lab work which is normal/stable. We will keep an eye on the protein in your urine.   We will proceed with your treatment today.  Return as scheduled.    Thank you for choosing [facility] at [facility] to provide your oncology and hematology care.  To afford each patient quality time with our provider, please arrive at least 15 minutes before your scheduled appointment time.   If you have a lab appointment with the [facility] please come in thru the Main Entrance and check in at the main information desk.  You need to re-schedule your appointment should you arrive 10 or more minutes late.  We strive to give you quality time with our providers, and arriving late affects you and other patients whose appointments are after yours.  Also, if you no show three or more times for appointments you may be dismissed from the clinic at the providers discretion.     Again, thank you for choosing [facility].  Our hope is that these requests will decrease the amount of time that you wait before being seen by our physicians.       _____________________________________________________________  Should you have questions after your visit to [facility], please contact our office at [phone] and follow the prompts.  Our office hours are 8:00 a.m. and 4:30 p.m. Monday - Friday.  Please note that voicemails left after 4:00 p.m. may not be returned until the following business day.  We are closed weekends and major holidays.  You do have access to a nurse 24-7, just call the main number to the clinic [phone] and do not press any options, hold on the line and a nurse will answer the phone.    For  prescription refill requests, have your pharmacy contact our office and allow 72 hours.    Due to Covid, you will need to wear a mask upon entering the hospital. If you do not have a mask, a mask will be given to you at the Main Entrance upon arrival. For doctor visits, patients may have 1 support person age 21 or older with them. For treatment visits, patients can not have anyone with them due to social distancing guidelines and our immunocompromised population.

## 2022-05-27 NOTE — Patient Instructions (Signed)
[facility]  Discharge Instructions: Thank you for choosing [facility] to provide your oncology and hematology care.  If you have a lab appointment with the [facility], please come in thru the Main Entrance and check in at the main information desk.  Wear comfortable clothing and clothing appropriate for easy access to any Portacath or PICC line.   We strive to give you quality time with your provider. You may need to reschedule your appointment if you arrive late (15 or more minutes).  Arriving late affects you and other patients whose appointments are after yours.  Also, if you miss three or more appointments without notifying the office, you may be dismissed from the clinic at the provider's discretion.      For prescription refill requests, have your pharmacy contact our office and allow 72 hours for refills to be completed.    Today you received the following chemotherapy and/or immunotherapy agents Leucovorin/Avastin/Fluorouracil.   Leucovorin Injection What is this medication? LEUCOVORIN (loo koe VOR in) prevents side effects from certain medications, such as methotrexate. It works by increasing folate levels. This helps protect healthy cells in your body. It may also be used to treat anemia caused by low levels of folate. It can also be used with fluorouracil, a type of chemotherapy, to treat colorectal cancer. It works by increasing the effects of fluorouracil in the body. This medicine may be used for other purposes; ask your health care provider or pharmacist if you have questions. What should I tell my care team before I take this medication? They need to know if you have any of these conditions: Anemia from low levels of vitamin B12 in the blood An unusual or allergic reaction to leucovorin, folic acid, other medications, foods, dyes, or preservatives Pregnant or trying to get pregnant Breastfeeding How should I use this medication? This  medication is injected into a vein or a muscle. It is given by your care team in a hospital or clinic setting. Talk to your care team about the use of this medication in children. Special care may be needed. Overdosage: If you think you have taken too much of this medicine contact a poison control center or emergency room at once. NOTE: This medicine is only for you. Do not share this medicine with others. What if I miss a dose? Keep appointments for follow-up doses. It is important not to miss your dose. Call your care team if you are unable to keep an appointment. What may interact with this medication? Capecitabine Fluorouracil Phenobarbital Phenytoin Primidone Trimethoprim;sulfamethoxazole This list may not describe all possible interactions. Give your health care provider a list of all the medicines, herbs, non-prescription drugs, or dietary supplements you use. Also tell them if you smoke, drink alcohol, or use illegal drugs. Some items may interact with your medicine. What should I watch for while using this medication? Your condition will be monitored carefully while you are receiving this medication. This medication may increase the side effects of 5-fluorouracil. Tell your care team if you have diarrhea or mouth sores that do not get better or that get worse. What side effects may I notice from receiving this medication? Side effects that you should report to your care team as soon as possible: Allergic reactions--skin rash, itching, hives, swelling of the face, lips, tongue, or throat This list may not describe all possible side effects. Call your doctor for medical advice about side effects. You may report side effects to  FDA at 1-800-FDA-1088. Where should I keep my medication? This medication is given in a hospital or clinic. It will not be stored at home. NOTE: This sheet is a summary. It may not cover all possible information. If you have questions about this medicine, talk to  your doctor, pharmacist, or health care provider.  2023 Elsevier/Gold Standard (2021-11-18 00:00:00)    Bevacizumab Injection What is this medication? BEVACIZUMAB (be va SIZ yoo mab) treats some types of cancer. It works by blocking a protein that causes cancer cells to grow and multiply. This helps to slow or stop the spread of cancer cells. It is a monoclonal antibody. This medicine may be used for other purposes; ask your health care provider or pharmacist if you have questions. COMMON BRAND NAME(S): Alymsys, Avastin, MVASI, Noah Charon What should I tell my care team before I take this medication? They need to know if you have any of these conditions: Blood clots Coughing up blood Having or recent surgery Heart failure High blood pressure History of a connection between 2 or more body parts that do not usually connect (fistula) History of a tear in your stomach or intestines Protein in your urine An unusual or allergic reaction to bevacizumab, other medications, foods, dyes, or preservatives Pregnant or trying to get pregnant Breast-feeding How should I use this medication? This medication is injected into a vein. It is given by your care team in a hospital or clinic setting. Talk to your care team the use of this medication in children. Special care may be needed. Overdosage: If you think you have taken too much of this medicine contact a poison control center or emergency room at once. NOTE: This medicine is only for you. Do not share this medicine with others. What if I miss a dose? Keep appointments for follow-up doses. It is important not to miss your dose. Call your care team if you are unable to keep an appointment. What may interact with this medication? Interactions are not expected. This list may not describe all possible interactions. Give your health care provider a list of all the medicines, herbs, non-prescription drugs, or dietary supplements you use. Also tell them if  you smoke, drink alcohol, or use illegal drugs. Some items may interact with your medicine. What should I watch for while using this medication? Your condition will be monitored carefully while you are receiving this medication. You may need blood work while taking this medication. This medication may make you feel generally unwell. This is not uncommon as chemotherapy can affect healthy cells as well as cancer cells. Report any side effects. Continue your course of treatment even though you feel ill unless your care team tells you to stop. This medication may increase your risk to bruise or bleed. Call your care team if you notice any unusual bleeding. Before having surgery, talk to your care team to make sure it is ok. This medication can increase the risk of poor healing of your surgical site or wound. You will need to stop this medication for 28 days before surgery. After surgery, wait at least 28 days before restarting this medication. Make sure the surgical site or wound is healed enough before restarting this medication. Talk to your care team if questions. Talk to your care team if you may be pregnant. Serious birth defects can occur if you take this medication during pregnancy and for 6 months after the last dose. Contraception is recommended while taking this medication and for 6 months after  the last dose. Your care team can help you find the option that works for you. Do not breastfeed while taking this medication and for 6 months after the last dose. This medication can cause infertility. Talk to your care team if you are concerned about your fertility. What side effects may I notice from receiving this medication? Side effects that you should report to your care team as soon as possible: Allergic reactions--skin rash, itching, hives, swelling of the face, lips, tongue, or throat Bleeding--bloody or black, tar-like stools, vomiting blood or Taunja Brickner material that looks like coffee grounds, red  or dark Jadden Yim urine, small red or purple spots on skin, unusual bruising or bleeding Blood clot--pain, swelling, or warmth in the leg, shortness of breath, chest pain Heart attack--pain or tightness in the chest, shoulders, arms, or jaw, nausea, shortness of breath, cold or clammy skin, feeling faint or lightheaded Heart failure--shortness of breath, swelling of the ankles, feet, or hands, sudden weight gain, unusual weakness or fatigue Increase in blood pressure Infection--fever, chills, cough, sore throat, wounds that don't heal, pain or trouble when passing urine, general feeling of discomfort or being unwell Infusion reactions--chest pain, shortness of breath or trouble breathing, feeling faint or lightheaded Kidney injury--decrease in the amount of urine, swelling of the ankles, hands, or feet Stomach pain that is severe, does not go away, or gets worse Stroke--sudden numbness or weakness of the face, arm, or leg, trouble speaking, confusion, trouble walking, loss of balance or coordination, dizziness, severe headache, change in vision Sudden and severe headache, confusion, change in vision, seizures, which may be signs of posterior reversible encephalopathy syndrome (PRES) Side effects that usually do not require medical attention (report to your care team if they continue or are bothersome): Back pain Change in taste Diarrhea Dry skin Increased tears Nosebleed This list may not describe all possible side effects. Call your doctor for medical advice about side effects. You may report side effects to FDA at 1-800-FDA-1088. Where should I keep my medication? This medication is given in a hospital or clinic. It will not be stored at home. NOTE: This sheet is a summary. It may not cover all possible information. If you have questions about this medicine, talk to your doctor, pharmacist, or health care provider.  2023 Elsevier/Gold Standard (2021-10-17 00:00:00)    Fluorouracil  Injection What is this medication? FLUOROURACIL (flure oh YOOR a sil) treats some types of cancer. It works by slowing down the growth of cancer cells. This medicine may be used for other purposes; ask your health care provider or pharmacist if you have questions. COMMON BRAND NAME(S): Adrucil What should I tell my care team before I take this medication? They need to know if you have any of these conditions: Blood disorders Dihydropyrimidine dehydrogenase (DPD) deficiency Infection, such as chickenpox, cold sores, herpes Kidney disease Liver disease Poor nutrition Recent or ongoing radiation therapy An unusual or allergic reaction to fluorouracil, other medications, foods, dyes, or preservatives If you or your partner are pregnant or trying to get pregnant Breast-feeding How should I use this medication? This medication is injected into a vein. It is administered by your care team in a hospital or clinic setting. Talk to your care team about the use of this medication in children. Special care may be needed. Overdosage: If you think you have taken too much of this medicine contact a poison control center or emergency room at once. NOTE: This medicine is only for you. Do not share this  medicine with others. What if I miss a dose? Keep appointments for follow-up doses. It is important not to miss your dose. Call your care team if you are unable to keep an appointment. What may interact with this medication? Do not take this medication with any of the following: Live virus vaccines This medication may also interact with the following: Medications that treat or prevent blood clots, such as warfarin, enoxaparin, dalteparin This list may not describe all possible interactions. Give your health care provider a list of all the medicines, herbs, non-prescription drugs, or dietary supplements you use. Also tell them if you smoke, drink alcohol, or use illegal drugs. Some items may interact with  your medicine. What should I watch for while using this medication? Your condition will be monitored carefully while you are receiving this medication. This medication may make you feel generally unwell. This is not uncommon as chemotherapy can affect healthy cells as well as cancer cells. Report any side effects. Continue your course of treatment even though you feel ill unless your care team tells you to stop. In some cases, you may be given additional medications to help with side effects. Follow all directions for their use. This medication may increase your risk of getting an infection. Call your care team for advice if you get a fever, chills, sore throat, or other symptoms of a cold or flu. Do not treat yourself. Try to avoid being around people who are sick. This medication may increase your risk to bruise or bleed. Call your care team if you notice any unusual bleeding. Be careful brushing or flossing your teeth or using a toothpick because you may get an infection or bleed more easily. If you have any dental work done, tell your dentist you are receiving this medication. Avoid taking medications that contain aspirin, acetaminophen, ibuprofen, naproxen, or ketoprofen unless instructed by your care team. These medications may hide a fever. Do not treat diarrhea with over the counter products. Contact your care team if you have diarrhea that lasts more than 2 days or if it is severe and watery. This medication can make you more sensitive to the sun. Keep out of the sun. If you cannot avoid being in the sun, wear protective clothing and sunscreen. Do not use sun lamps, tanning beds, or tanning booths. Talk to your care team if you or your partner wish to become pregnant or think you might be pregnant. This medication can cause serious birth defects if taken during pregnancy and for 3 months after the last dose. A reliable form of contraception is recommended while taking this medication and for 3  months after the last dose. Talk to your care team about effective forms of contraception. Do not father a child while taking this medication and for 3 months after the last dose. Use a condom while having sex during this time period. Do not breastfeed while taking this medication. This medication may cause infertility. Talk to your care team if you are concerned about your fertility. What side effects may I notice from receiving this medication? Side effects that you should report to your care team as soon as possible: Allergic reactions--skin rash, itching, hives, swelling of the face, lips, tongue, or throat Heart attack--pain or tightness in the chest, shoulders, arms, or jaw, nausea, shortness of breath, cold or clammy skin, feeling faint or lightheaded Heart failure--shortness of breath, swelling of the ankles, feet, or hands, sudden weight gain, unusual weakness or fatigue Heart rhythm changes--fast or  irregular heartbeat, dizziness, feeling faint or lightheaded, chest pain, trouble breathing High ammonia level--unusual weakness or fatigue, confusion, loss of appetite, nausea, vomiting, seizures Infection--fever, chills, cough, sore throat, wounds that don't heal, pain or trouble when passing urine, general feeling of discomfort or being unwell Low red blood cell level--unusual weakness or fatigue, dizziness, headache, trouble breathing Pain, tingling, or numbness in the hands or feet, muscle weakness, change in vision, confusion or trouble speaking, loss of balance or coordination, trouble walking, seizures Redness, swelling, and blistering of the skin over hands and feet Severe or prolonged diarrhea Unusual bruising or bleeding Side effects that usually do not require medical attention (report to your care team if they continue or are bothersome): Dry skin Headache Increased tears Nausea Pain, redness, or swelling with sores inside the mouth or throat Sensitivity to  light Vomiting This list may not describe all possible side effects. Call your doctor for medical advice about side effects. You may report side effects to FDA at 1-800-FDA-1088. Where should I keep my medication? This medication is given in a hospital or clinic. It will not be stored at home. NOTE: This sheet is a summary. It may not cover all possible information. If you have questions about this medicine, talk to your doctor, pharmacist, or health care provider.  2023 Elsevier/Gold Standard (2021-10-14 00:00:00)        To help prevent nausea and vomiting after your treatment, we encourage you to take your nausea medication as directed.  BELOW ARE SYMPTOMS THAT SHOULD BE REPORTED IMMEDIATELY: *FEVER GREATER THAN 100.4 F (38 C) OR HIGHER *CHILLS OR SWEATING *NAUSEA AND VOMITING THAT IS NOT CONTROLLED WITH YOUR NAUSEA MEDICATION *UNUSUAL SHORTNESS OF BREATH *UNUSUAL BRUISING OR BLEEDING *URINARY PROBLEMS (pain or burning when urinating, or frequent urination) *BOWEL PROBLEMS (unusual diarrhea, constipation, pain near the anus) TENDERNESS IN MOUTH AND THROAT WITH OR WITHOUT PRESENCE OF ULCERS (sore throat, sores in mouth, or a toothache) UNUSUAL RASH, SWELLING OR PAIN  UNUSUAL VAGINAL DISCHARGE OR ITCHING   Items with * indicate a potential emergency and should be followed up as soon as possible or go to the Emergency Department if any problems should occur.  Please show the CHEMOTHERAPY ALERT CARD or IMMUNOTHERAPY ALERT CARD at check-in to the Emergency Department and triage nurse.  Should you have questions after your visit or need to cancel or reschedule your appointment, please contact [facility] [phone]  and follow the prompts.  Office hours are 8:00 a.m. to 4:30 p.m. Monday - Friday. Please note that voicemails left after 4:00 p.m. may not be returned until the following business day.  We are closed weekends and major holidays. You have access to a nurse  at all times for urgent questions. Please call the main number to the clinic [phone] and follow the prompts.  For any non-urgent questions, you may also contact your provider using MyChart. We now offer e-Visits for anyone 72 and older to request care online for non-urgent symptoms. For details visit [email or web address].   Also download the MyChart app! Go to the app store, search "MyChart", open the app, select [facility], and log in with your MyChart username and password.  Masks are optional in the cancer centers. If you would like for your care team to wear a mask while they are taking care of you, please let them know. You may have one support person who is at least 62 years old accompany you for your appointments.

## 2022-05-27 NOTE — Progress Notes (Signed)
Patient has been examined by Dr. Katragadda, and vital signs and labs have been reviewed. ANC, Creatinine, LFTs, hemoglobin, and platelets are within treatment parameters per M.D. - pt may proceed with treatment.  Primary RN and pharmacy notified.  

## 2022-05-27 NOTE — Progress Notes (Signed)
Patient presents today for chemotherapy infusion.  Patient is in satisfactory condition with no complaints voiced. Vital signs are stable.  Labs reviewed by Dr. Delton Coombes during her office visit.  We will proceed with treatment per MD orders.  Patient tolerated treatment well with no complaints voiced.  Home infusion 5FU pump connected with no difficulty.  Patient left ambulatory in stable condition.  Vital signs stable at discharge.  Follow up as scheduled.

## 2022-05-27 NOTE — Progress Notes (Signed)
[name], [name]   CLINIC:  Medical Oncology/Hematology  PCP:  Patient, No Pcp Per None None   REASON FOR VISIT:  Follow-up for metastatic colon cancer to liver  PRIOR THERAPY: Sigmoid colectomy and end colostomy on 04/29/2020  NGS Results: Foundation 1 KRAS wildtype, MS--stable, TMB 4 Muts/Mb  CURRENT THERAPY: Maintenance 5-FU and bevacizumab  BRIEF ONCOLOGIC HISTORY:  Oncology History  Metastatic colon cancer to liver ([place])  05/16/2020 Initial Diagnosis   Metastatic colon cancer to liver ([facility])   06/03/2020 Genetic Testing   Foundation One:     06/04/2020 - 02/25/2022 Chemotherapy   Patient is on Treatment Plan : COLORECTAL FOLFOX + Bevacizumab q14d     06/04/2020 -  Chemotherapy   Patient is on Treatment Plan : COLORECTAL FOLFOX + Bevacizumab q14d       CANCER STAGING:  Cancer Staging  No matching staging information was found for the patient.  INTERVAL HISTORY:  [name], a 61 y.o. female, seen for follow-up and toxicity assessment prior to next cycle of maintenance 5-FU and bevacizumab.  She reports energy levels of 70%.  She reports cramping in the legs at nighttime as she started walking more during the daytime.  Dyspnea on exertion is stable.  No bleeding issues reported.  REVIEW OF SYSTEMS:  Review of Systems  Constitutional:  Negative for appetite change and fatigue.  HENT:   Negative for nosebleeds.   Respiratory:  Positive for shortness of breath (Occasional).   Gastrointestinal:  Negative for abdominal pain and diarrhea.  Hematological:  Does not bruise/bleed easily.  All other systems reviewed and are negative.   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 Delton Coombes   04-29-2020 [facility] surgery for perforated colon s/p sigmoid colectomy w/ colostomy; dx  Stage IV colon cancer mets to liver    Anxiety    Depression    Hypertension    followed by dr Glo Herring and oncology  (05-27-2020 per pt does not have pcp yet)   IDA (iron deficiency anemia)    Pulmonary nodule, right    Past Surgical History:  Procedure Laterality Date   ABDOMINAL HYSTERECTOMY  03-31-2016   _0    W/  BILATERAL SALPINOOPHORECTOMY    APPLICATION OF WOUND VAC N/A 04/29/2020   Procedure: APPLICATION OF WOUND VAC;  Surgeon: Mickeal Skinner, MD;  Location: [facility];  Service: General;  Laterality: N/A;   ENDOMETRIAL ABLATION     LAPAROTOMY N/A 04/29/2020   Procedure: EXPLORATORY LAPAROTOMY;  Surgeon: Mickeal Skinner, MD;  Location: [facility];  Service: General;  Laterality: N/A;   PARTIAL COLECTOMY N/A 04/29/2020   Procedure: PARTIAL COLECTOMY WITH END COLOSTOMY;  Surgeon: Kieth Brightly Arta Bruce, MD;  Location: [facility];  Service: General;  Laterality: N/A;   PORTACATH PLACEMENT Right 05/28/2020   Procedure: INSERTION PORT-A-CATH;  Surgeon: Kinsinger, Arta Bruce, MD;  Location: [facility];  Service: General;  Laterality: Right;   SALPINGOOPHORECTOMY Left 03/31/2016   Procedure: LEFT SALPINGO OOPHORECTOMY WITH FROZEN SECTION,  ABDOMINAL HYSTERECTOMY WITH BILATERAL SALPINGO-OOPHORECTOMY;  Surgeon: Jonnie Kind, MD;  Location: [facility];  Service: Gynecology;  Laterality: Left;    SOCIAL HISTORY:  Social History   Socioeconomic History   Marital status: Divorced    Spouse name: Not on file   Number of children: Not on file   Years of education: Not on file  Highest education level: Not on file  Occupational History   Not on file  Tobacco Use   Smoking status: Former    Packs/day: 0.50    Years: 5.00    Total pack years: 2.50    Types: Cigarettes    Quit date: 03/28/2015    Years since quitting: 7.1   Smokeless tobacco: Never  Vaping Use   Vaping Use: Never used  Substance and Sexual Activity   Alcohol use: No   Drug use: Never   Sexual activity: Yes    Partners: Male    Birth  control/protection: Surgical  Other Topics Concern   Not on file  Social History Narrative   Not on file   Social Determinants of Health   Financial Resource Strain: Low Risk  (11/01/2019)   Overall Financial Resource Strain (CARDIA)    Difficulty of Paying Living Expenses: Not very hard  Food Insecurity: No Food Insecurity (11/01/2019)   Hunger Vital Sign    Worried About Running Out of Food in the Last Year: Never true    Ran Out of Food in the Last Year: Never true  Transportation Needs: No Transportation Needs (11/01/2019)   PRAPARE - Hydrologist (Medical): No    Lack of Transportation (Non-Medical): No  Physical Activity: Insufficiently Active (11/01/2019)   Exercise Vital Sign    Days of Exercise per Week: 2 days    Minutes of Exercise per Session: 30 min  Stress: Stress Concern Present (11/01/2019)   [place]    Feeling of Stress : To some extent  Social Connections: Moderately Integrated (11/01/2019)   Social Connection and Isolation Panel [NHANES]    Frequency of Communication with Friends and Family: More than three times a week    Frequency of Social Gatherings with Friends and Family: Once a week    Attends Religious Services: More than 4 times per year    Active Member of [employer] or Organizations: No    Attends Archivist Meetings: Never    Marital Status: Living with partner  Intimate Partner Violence: Not At Risk (11/01/2019)   Humiliation, Afraid, Rape, and Kick questionnaire    Fear of Current or Ex-Partner: No    Emotionally Abused: No    Physically Abused: No    Sexually Abused: No    FAMILY HISTORY:  Family History  Problem Relation Age of Onset   Hypertension Mother    Diabetes Mother    Cirrhosis Father     CURRENT MEDICATIONS:  Current Outpatient Medications  Medication Sig Dispense Refill   amLODipine (NORVASC) 5 MG tablet TAKE 1 TABLET (5 MG TOTAL)  BY MOUTH DAILY. 90 tablet 1   BEVACIZUMAB IV Inject 5 mg/kg into the vein every 14 (fourteen) days.     clonazePAM (KLONOPIN) 1 MG tablet Take 1 tablet (1 mg total) by mouth in the morning, at noon, and at bedtime. 90 tablet 3   fluorouracil CALGB 45364 in sodium chloride 0.9 % 150 mL Inject 2,400 mg/m2 into the vein over 48 hr.     gabapentin (NEURONTIN) 100 MG capsule TAKE 1 CAPSULE (100 MG TOTAL) BY MOUTH THREE TIMES DAILY. 90 capsule 3   LEUCOVORIN CALCIUM IV Inject 400 mg/m2 into the vein every 21 ( twenty-one) days.     lidocaine (XYLOCAINE) 2 % solution Use as directed 15 mLs in the mouth or throat every 6 (six) hours as needed for mouth pain. Swish  and spit/swallow every six hours as needed for mouth pain 480 mL 3   LORazepam (ATIVAN) 1 MG tablet Take 1 tablet (1 mg total) by mouth every 6 (six) hours as needed. for anxiety 120 tablet 1   nystatin (MYCOSTATIN) 100000 UNIT/ML suspension Take by mouth.     PARoxetine (PAXIL) 40 MG tablet Take 1 tablet (40 mg total) by mouth every morning. 90 tablet 3   polyethylene glycol (MIRALAX / GLYCOLAX) packet Take 17 g by mouth every other day.     potassium chloride (KLOR-CON M) 10 MEQ tablet Take 2 tablets (20 mEq total) by mouth 3 (three) times daily. 90 tablet 6   triamterene-hydrochlorothiazide (MAXZIDE-25) 37.5-25 MG tablet TAKE 1 TABLET BY MOUTH EVERY DAY 90 tablet 1   No current facility-administered medications for this visit.   Facility-Administered Medications Ordered in Other Visits  Medication Dose Route Frequency Provider Last Rate Last Admin   dextrose 5 % solution   Intravenous Once Derek Jack, MD       fluorouracil (ADRUCIL) 3,250 mg in sodium chloride 0.9 % 85 mL chemo infusion  3,250 mg Intravenous 1 day or 1 dose Derek Jack, MD   Infusion Verify at 05/27/22 1356    ALLERGIES:  No Known Allergies  PHYSICAL EXAM:  Performance status (ECOG): 1 - Symptomatic but completely ambulatory  There were no vitals  filed for this visit.  Wt Readings from Last 3 Encounters:  05/27/22 186 lb 8 oz (84.6 kg)  05/04/22 187 lb 3.2 oz (84.9 kg)  04/13/22 187 lb 9.6 oz (85.1 kg)   Physical Exam Vitals reviewed.  Constitutional:      Appearance: Normal appearance.  Cardiovascular:     Rate and Rhythm: Normal rate and regular rhythm.     Pulses: Normal pulses.     Heart sounds: Normal heart sounds.  Pulmonary:     Effort: Pulmonary effort is normal.     Breath sounds: Normal breath sounds.  Abdominal:     Palpations: Abdomen is soft. There is no mass.     Tenderness: There is no abdominal tenderness.  Musculoskeletal:     Right lower leg: No edema.     Left lower leg: No edema.  Neurological:     General: No focal deficit present.     Mental Status: She is alert and oriented to person, place, and time.  Psychiatric:        Mood and Affect: Mood normal.        Behavior: Behavior normal.     LABORATORY DATA:  I have reviewed the labs as listed.     Latest Ref Rng & Units 05/27/2022    9:12 AM 05/04/2022    9:38 AM 04/13/2022    8:47 AM  CBC  WBC 4.0 - 10.5 K/uL 6.0  5.1  4.8   Hemoglobin 12.0 - 15.0 g/dL 14.2  13.3  13.9   Hematocrit 36.0 - 46.0 % 42.3  40.0  41.0   Platelets 150 - 400 K/uL 169  170  140       Latest Ref Rng & Units 05/27/2022    9:12 AM 05/04/2022    9:38 AM 04/13/2022    8:47 AM  CMP  Glucose 70 - 99 mg/dL 154  159  143   BUN 8 - 23 mg/dL _0 Creatinine 0.44 - 1.00 mg/dL 0.87  0.92  1.00   Sodium 135 - 145 mmol/L 136  135  136  Potassium 3.5 - 5.1 mmol/L 3.5  3.3  3.4   Chloride 98 - 111 mmol/L 100  102  102   CO2 22 - 32 mmol/L _0 Calcium 8.9 - 10.3 mg/dL 9.1  8.6  9.0   Total Protein 6.5 - 8.1 g/dL 7.7  7.2  7.3   Total Bilirubin 0.3 - 1.2 mg/dL 0.7  0.9  0.6   Alkaline Phos 38 - 126 U/L 101  95  74   AST 15 - 41 U/L 44  44  34   ALT 0 - 44 U/L 34  39  32     DIAGNOSTIC IMAGING:  I have independently reviewed the scans and discussed  with the patient. CT CHEST ABDOMEN PELVIS W CONTRAST  Result Date: 05/08/2022 CLINICAL DATA:  Metastatic colon cancer to liver. Partial colectomy in 2021. * Tracking Code: BO * EXAM: CT CHEST, ABDOMEN, AND PELVIS WITH CONTRAST TECHNIQUE: Multidetector CT imaging of the chest, abdomen and pelvis was performed following the standard protocol during bolus administration of intravenous contrast. RADIATION DOSE REDUCTION: This exam was performed according to the departmental dose-optimization program which includes automated exposure control, adjustment of the mA and/or kV according to patient size and/or use of iterative reconstruction technique. CONTRAST:  124m OMNIPAQUE IOHEXOL 300 MG/ML  SOLN COMPARISON:  01/26/2022. FINDINGS: CT CHEST FINDINGS Cardiovascular: Right Port-A-Cath tip high right atrium. Aortic atherosclerosis. Borderline cardiomegaly. No central pulmonary embolism, on this non-dedicated study. Mediastinum/Nodes: No supraclavicular adenopathy. No mediastinal or hilar adenopathy. Lungs/Pleura: No pleural fluid. Bilateral pulmonary nodules are similar, favoring a benign etiology. These are identified on series 3. Example right middle lobe 4 mm nodule on 94/3. Left major fissure thickening or a subpleural lymph node, unchanged at maximally 9 mm on 69/3. Musculoskeletal: No acute osseous abnormality. CT ABDOMEN PELVIS FINDINGS Hepatobiliary: Liver lesions are improved. For example, the dominant right hepatic lobe lesion is only subtly apparent, estimated at 1.9 x 1.4 cm on 57/2 versus 2.4 x 2.2 cm on the prior. A segment 8 lesion is only subtly apparent at 1.4 cm on 48/2 versus 1.7 cm on the prior. Other smaller lesions have nearly completely to completely resolved. No new or progressive lesions. Normal gallbladder, without biliary ductal dilatation. Pancreas: Normal, without mass or ductal dilatation. Spleen: Normal in size, without focal abnormality. Adrenals/Urinary Tract: Normal adrenal glands.  Normal kidneys, without hydronephrosis. Normal urinary bladder. Stomach/Bowel: Normal stomach, without wall thickening. Hartmann's pouch. Descending colostomy. Prominent fat surrounding the ostomy site is similar and may represent a component of parastomal hernia. Normal remainder of the colon. Normal terminal ileum.  Normal small bowel. Vascular/Lymphatic: Aortic atherosclerosis. No abdominopelvic adenopathy. Reproductive: Hysterectomy.  No adnexal mass. Other: No significant free fluid. mild pelvic floor laxity. No evidence of omental or peritoneal disease. No free intraperitoneal air. Musculoskeletal: Degenerative disc disease at the lumbosacral junction. IMPRESSION: 1. Response to therapy of hepatic metastasis. 2. Similar small pulmonary nodules, favoring a benign etiology. 3. No new or progressive disease. 4.  Aortic Atherosclerosis (ICD10-I70.0). Electronically Signed   By: KAbigail MiyamotoM.D.   On: 05/08/2022 12:13     ASSESSMENT:  1.  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 -CT chest on 05/02/2020 with no evidence of pulmonary metastatic disease. -CTAP on 05/07/2020 with multiple lesions throughout the liver, largest in  the right lobe measuring 3.9 cm, lateral segment left lobe measuring 2.6 cm and inferior right lobe confluent lesion 4.2 x 3.7 cm.  No lymphadenopathy. -CEA on 05/02/2020-60.2. -FOLFOX and bevacizumab started on 06/04/2020. 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   2.  Iron deficiency anemia: -Feraheme on 04/30/2020 and 05/06/2020.   3.  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 -Smoked for 3 to 4 years and quit. -Mother had cancer, type unknown.  Maternal uncle  had lung cancer.  Maternal aunt had lung cancer.   PLAN:  1.  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 - CT CAP (05/07/2022): Liver lesions have slightly improved.  Similar lung findings.  No new progressive disease. - Last CEA was 2.8. - Reviewed labs today which showed AST 44.  Rest of LFTs normal.  CBC was normal.  Urine protein is 100.  She may proceed with treatment today and in 2 weeks.  RTC 4 weeks for follow-up.   2.  Hypertension: - Continue triamterene/HCTZ and Norvasc 5 mg daily.  Blood pressure systolic 852.   3.  Severe hypokalemia: - Continue potassium 10 mEq 3 tablets daily.  Potassium 3.5.   4.  Peripheral neuropathy: - Continue gabapentin 100 mg in the mornings.  Neuropathy stable.   5.  Anxiety: - Continue Klonopin 1 mg 3 times daily.  Continue Paxil daily.  6.  Mucositis: - Continue Azlin and lidocaine gel as needed.   Orders placed this encounter:  Orders Placed This Encounter  Procedures   CEA   CBC with Differential   Comprehensive metabolic panel   Urinalysis, dipstick only      Derek Jack, MD [facility] [phone]

## 2022-05-28 ENCOUNTER — Other Ambulatory Visit: Payer: Self-pay

## 2022-05-29 ENCOUNTER — Inpatient Hospital Stay: Payer: Medicaid Other | Attending: Hematology

## 2022-05-29 VITALS — BP 161/94 | HR 69 | Temp 98.2°F | Resp 18

## 2022-05-29 DIAGNOSIS — E876 Hypokalemia: Secondary | ICD-10-CM | POA: Insufficient documentation

## 2022-05-29 DIAGNOSIS — I10 Essential (primary) hypertension: Secondary | ICD-10-CM | POA: Diagnosis not present

## 2022-05-29 DIAGNOSIS — R911 Solitary pulmonary nodule: Secondary | ICD-10-CM | POA: Insufficient documentation

## 2022-05-29 DIAGNOSIS — G620 Drug-induced polyneuropathy: Secondary | ICD-10-CM | POA: Insufficient documentation

## 2022-05-29 DIAGNOSIS — C189 Malignant neoplasm of colon, unspecified: Secondary | ICD-10-CM | POA: Diagnosis not present

## 2022-05-29 DIAGNOSIS — Z800 Family history of malignant neoplasm of digestive organs: Secondary | ICD-10-CM | POA: Insufficient documentation

## 2022-05-29 DIAGNOSIS — D509 Iron deficiency anemia, unspecified: Secondary | ICD-10-CM | POA: Insufficient documentation

## 2022-05-29 DIAGNOSIS — Z933 Colostomy status: Secondary | ICD-10-CM | POA: Diagnosis not present

## 2022-05-29 DIAGNOSIS — K1230 Oral mucositis (ulcerative), unspecified: Secondary | ICD-10-CM | POA: Insufficient documentation

## 2022-05-29 DIAGNOSIS — Z5111 Encounter for antineoplastic chemotherapy: Secondary | ICD-10-CM | POA: Insufficient documentation

## 2022-05-29 DIAGNOSIS — C787 Secondary malignant neoplasm of liver and intrahepatic bile duct: Secondary | ICD-10-CM | POA: Diagnosis not present

## 2022-05-29 DIAGNOSIS — Z801 Family history of malignant neoplasm of trachea, bronchus and lung: Secondary | ICD-10-CM | POA: Diagnosis not present

## 2022-05-29 LAB — CEA: CEA: 2.5 ng/mL (ref 0.0–4.7)

## 2022-05-29 MED ORDER — HEPARIN SOD (PORK) LOCK FLUSH 100 UNIT/ML IV SOLN
500.0000 [IU] | Freq: Once | INTRAVENOUS | Status: AC | PRN
  Administered 2022-05-29: 500 [IU]

## 2022-05-29 MED ORDER — SODIUM CHLORIDE 0.9% FLUSH
10.0000 mL | INTRAVENOUS | Status: DC | PRN
  Administered 2022-05-29: 10 mL

## 2022-05-29 NOTE — Patient Instructions (Signed)
[facility]  Discharge Instructions: Thank you for choosing [facility] to provide your oncology and hematology care.  If you have a lab appointment with the [facility], please come in thru the Main Entrance and check in at the main information desk.  Wear comfortable clothing and clothing appropriate for easy access to any Portacath or PICC line.   We strive to give you quality time with your provider. You may need to reschedule your appointment if you arrive late (15 or more minutes).  Arriving late affects you and other patients whose appointments are after yours.  Also, if you miss three or more appointments without notifying the office, you may be dismissed from the clinic at the provider's discretion.      For prescription refill requests, have your pharmacy contact our office and allow 72 hours for refills to be completed.    Today you received the following chemotherapy and/or immunotherapy agents pump d/c     BELOW ARE SYMPTOMS THAT SHOULD BE REPORTED IMMEDIATELY: *FEVER GREATER THAN 100.4 F (38 C) OR HIGHER *CHILLS OR SWEATING *NAUSEA AND VOMITING THAT IS NOT CONTROLLED WITH YOUR NAUSEA MEDICATION *UNUSUAL SHORTNESS OF BREATH *UNUSUAL BRUISING OR BLEEDING *URINARY PROBLEMS (pain or burning when urinating, or frequent urination) *BOWEL PROBLEMS (unusual diarrhea, constipation, pain near the anus) TENDERNESS IN MOUTH AND THROAT WITH OR WITHOUT PRESENCE OF ULCERS (sore throat, sores in mouth, or a toothache) UNUSUAL RASH, SWELLING OR PAIN  UNUSUAL VAGINAL DISCHARGE OR ITCHING   Items with * indicate a potential emergency and should be followed up as soon as possible or go to the Emergency Department if any problems should occur.  Please show the CHEMOTHERAPY ALERT CARD or IMMUNOTHERAPY ALERT CARD at check-in to the Emergency Department and triage nurse.  Should you have questions after your visit or need to cancel or reschedule your  appointment, please contact [facility] [phone]  and follow the prompts.  Office hours are 8:00 a.m. to 4:30 p.m. Monday - Friday. Please note that voicemails left after 4:00 p.m. may not be returned until the following business day.  We are closed weekends and major holidays. You have access to a nurse at all times for urgent questions. Please call the main number to the clinic [phone] and follow the prompts.  For any non-urgent questions, you may also contact your provider using MyChart. We now offer e-Visits for anyone 39 and older to request care online for non-urgent symptoms. For details visit [email or web address].   Also download the MyChart app! Go to the app store, search "MyChart", open the app, select [facility], and log in with your MyChart username and password.  Masks are optional in the cancer centers. If you would like for your care team to wear a mask while they are taking care of you, please let them know. You may have one support person who is at least 62 years old accompany you for your appointments.

## 2022-05-29 NOTE — Progress Notes (Signed)
Pt presents today for 5FU chemotherapy pump disconnection per provider's order. Vital signs stable. Port flushed easily without difficulty with 10 mL of normal saline and 5 mL of heparin. Good blood return noted. Needle removed intact and no bruising or swelling noted at the site.  Discharged from clinic ambulatory in stable condition. Alert and oriented x 3. F/U with [facility] as scheduled.

## 2022-06-01 ENCOUNTER — Other Ambulatory Visit: Payer: Medicaid Other

## 2022-06-01 ENCOUNTER — Ambulatory Visit: Payer: Medicaid Other

## 2022-06-08 ENCOUNTER — Other Ambulatory Visit: Payer: Medicaid Other

## 2022-06-10 ENCOUNTER — Other Ambulatory Visit: Payer: Self-pay | Admitting: Hematology

## 2022-06-10 ENCOUNTER — Other Ambulatory Visit (HOSPITAL_COMMUNITY): Payer: Self-pay | Admitting: Hematology

## 2022-06-10 ENCOUNTER — Inpatient Hospital Stay: Payer: Medicaid Other

## 2022-06-10 ENCOUNTER — Other Ambulatory Visit: Payer: Self-pay | Admitting: *Deleted

## 2022-06-10 VITALS — BP 149/77 | HR 83 | Temp 98.1°F | Resp 18

## 2022-06-10 VITALS — BP 164/82 | HR 83 | Temp 98.5°F | Wt 188.2 lb

## 2022-06-10 DIAGNOSIS — E876 Hypokalemia: Secondary | ICD-10-CM

## 2022-06-10 DIAGNOSIS — Z95828 Presence of other vascular implants and grafts: Secondary | ICD-10-CM

## 2022-06-10 DIAGNOSIS — C189 Malignant neoplasm of colon, unspecified: Secondary | ICD-10-CM

## 2022-06-10 DIAGNOSIS — Z5111 Encounter for antineoplastic chemotherapy: Secondary | ICD-10-CM | POA: Diagnosis not present

## 2022-06-10 DIAGNOSIS — F419 Anxiety disorder, unspecified: Secondary | ICD-10-CM

## 2022-06-10 LAB — COMPREHENSIVE METABOLIC PANEL
ALT: 32 U/L (ref 0–44)
AST: 39 U/L (ref 15–41)
Albumin: 3.6 g/dL (ref 3.5–5.0)
Alkaline Phosphatase: 95 U/L (ref 38–126)
Anion gap: 10 (ref 5–15)
BUN: 13 mg/dL (ref 8–23)
CO2: 25 mmol/L (ref 22–32)
Calcium: 8.9 mg/dL (ref 8.9–10.3)
Chloride: 101 mmol/L (ref 98–111)
Creatinine, Ser: 0.86 mg/dL (ref 0.44–1.00)
GFR, Estimated: >60 mL/min (ref >60)
Glucose, Bld: 155 mg/dL — ABNORMAL HIGH (ref 70–99)
Potassium: 3.5 mmol/L (ref 3.5–5.1)
Sodium: 136 mmol/L (ref 135–145)
Total Bilirubin: 0.8 mg/dL (ref 0.3–1.2)
Total Protein: 7.2 g/dL (ref 6.5–8.1)

## 2022-06-10 LAB — CBC WITH DIFFERENTIAL/PLATELET
Abs Immature Granulocytes: 0.01 10*3/uL (ref 0.00–0.07)
Basophils Absolute: 0 10*3/uL (ref 0.0–0.1)
Basophils Relative: 1 %
Eosinophils Absolute: 0.1 10*3/uL (ref 0.0–0.5)
Eosinophils Relative: 2 %
HCT: 40.5 % (ref 36.0–46.0)
Hemoglobin: 13.5 g/dL (ref 12.0–15.0)
Immature Granulocytes: 0 %
Lymphocytes Relative: 22 %
Lymphs Abs: 1.3 10*3/uL (ref 0.7–4.0)
MCH: 31.2 pg (ref 26.0–34.0)
MCHC: 33.3 g/dL (ref 30.0–36.0)
MCV: 93.5 fL (ref 80.0–100.0)
Monocytes Absolute: 0.5 10*3/uL (ref 0.1–1.0)
Monocytes Relative: 9 %
Neutro Abs: 3.9 10*3/uL (ref 1.7–7.7)
Neutrophils Relative %: 66 %
Platelets: 133 10*3/uL — ABNORMAL LOW (ref 150–400)
RBC: 4.33 MIL/uL (ref 3.87–5.11)
RDW: 16.2 % — ABNORMAL HIGH (ref 11.5–15.5)
WBC: 5.9 10*3/uL (ref 4.0–10.5)
nRBC: 0 % (ref 0.0–0.2)

## 2022-06-10 LAB — URINALYSIS, DIPSTICK ONLY
Bilirubin Urine: NEGATIVE
Glucose, UA: NEGATIVE mg/dL
Hgb urine dipstick: NEGATIVE
Ketones, ur: NEGATIVE mg/dL
Leukocytes,Ua: NEGATIVE
Nitrite: NEGATIVE
Protein, ur: 100 mg/dL — AB
Specific Gravity, Urine: 1.017 (ref 1.005–1.030)
pH: 5 (ref 5.0–8.0)

## 2022-06-10 LAB — MAGNESIUM: Magnesium: 1.8 mg/dL (ref 1.7–2.4)

## 2022-06-10 MED ORDER — SODIUM CHLORIDE 0.9 % IV SOLN
3250.0000 mg | INTRAVENOUS | Status: AC
  Administered 2022-06-10: 3250 mg via INTRAVENOUS
  Filled 2022-06-10: qty 65

## 2022-06-10 MED ORDER — SODIUM CHLORIDE 0.9% FLUSH
10.0000 mL | INTRAVENOUS | Status: DC | PRN
  Administered 2022-06-10: 10 mL via INTRAVENOUS

## 2022-06-10 MED ORDER — PALONOSETRON HCL INJECTION 0.25 MG/5ML
0.2500 mg | Freq: Once | INTRAVENOUS | Status: AC
  Administered 2022-06-10: 0.25 mg via INTRAVENOUS
  Filled 2022-06-10: qty 5

## 2022-06-10 MED ORDER — CLONAZEPAM 1 MG PO TABS: 1.0000 mg | ORAL_TABLET | Freq: Three times a day (TID) | ORAL | 3 refills | Status: DC

## 2022-06-10 MED ORDER — SODIUM CHLORIDE 0.9 % IV SOLN: Freq: Once | INTRAVENOUS | Status: AC

## 2022-06-10 MED ORDER — FLUOROURACIL CHEMO INJECTION 2.5 GM/50ML
270.0000 mg/m2 | Freq: Once | INTRAVENOUS | Status: AC
  Administered 2022-06-10: 550 mg via INTRAVENOUS
  Filled 2022-06-10: qty 11

## 2022-06-10 MED ORDER — SODIUM CHLORIDE 0.9 % IV SOLN
320.0000 mg/m2 | Freq: Once | INTRAVENOUS | Status: AC
  Administered 2022-06-10: 628 mg via INTRAVENOUS
  Filled 2022-06-10: qty 31.4

## 2022-06-10 MED ORDER — SODIUM CHLORIDE 0.9 % IV SOLN
5.0000 mg/kg | Freq: Once | INTRAVENOUS | Status: AC
  Administered 2022-06-10: 400 mg via INTRAVENOUS
  Filled 2022-06-10: qty 16

## 2022-06-10 MED ORDER — SODIUM CHLORIDE 0.9 % IV SOLN
10.0000 mg | Freq: Once | INTRAVENOUS | Status: AC
  Administered 2022-06-10: 10 mg via INTRAVENOUS
  Filled 2022-06-10: qty 10

## 2022-06-10 NOTE — Progress Notes (Signed)
Patients port flushed without difficulty.  Good blood return noted with no bruising or swelling noted at site.  Patient remains accessed for chemotherapy treatment.  

## 2022-06-10 NOTE — Patient Instructions (Signed)
Susan Martin  Discharge Instructions: Thank you for choosing [facility] to provide your oncology and hematology care.  If you have a lab appointment with the [facility], please come in thru the Main Entrance and check in at the main information desk.  Wear comfortable clothing and clothing appropriate for easy access to any Portacath or PICC line.   We strive to give you quality time with your provider. You may need to reschedule your appointment if you arrive late (15 or more minutes).  Arriving late affects you and other patients whose appointments are after yours.  Also, if you miss three or more appointments without notifying the office, you may be dismissed from the clinic at the provider's discretion.      For prescription refill requests, have your pharmacy contact our office and allow 72 hours for refills to be completed.    Today you received the following chemotherapy and/or immunotherapy agents MVASI, Leucovorin, 5FU.   Fluorouracil Injection What is this medication? FLUOROURACIL (flure oh YOOR a sil) treats some types of cancer. It works by slowing down the growth of cancer cells. This medicine may be used for other purposes; ask your health care provider or pharmacist if you have questions. COMMON BRAND NAME(S): Adrucil What should I tell my care team before I take this medication? They need to know if you have any of these conditions: Blood disorders Dihydropyrimidine dehydrogenase (DPD) deficiency Infection, such as chickenpox, cold sores, herpes Kidney disease Liver disease Poor nutrition Recent or ongoing radiation therapy An unusual or allergic reaction to fluorouracil, other medications, foods, dyes, or preservatives If you or your partner are pregnant or trying to get pregnant Breast-feeding How should I use this medication? This medication is injected into a vein. It is administered by your care team in a hospital or clinic  setting. Talk to your care team about the use of this medication in children. Special care may be needed. Overdosage: If you think you have taken too much of this medicine contact a poison control center or emergency room at once. NOTE: This medicine is only for you. Do not share this medicine with others. What if I miss a dose? Keep appointments for follow-up doses. It is important not to miss your dose. Call your care team if you are unable to keep an appointment. What may interact with this medication? Do not take this medication with any of the following: Live virus vaccines This medication may also interact with the following: Medications that treat or prevent blood clots, such as warfarin, enoxaparin, dalteparin This list may not describe all possible interactions. Give your health care provider a list of all the medicines, herbs, non-prescription drugs, or dietary supplements you use. Also tell them if you smoke, drink alcohol, or use illegal drugs. Some items may interact with your medicine. What should I watch for while using this medication? Your condition will be monitored carefully while you are receiving this medication. This medication may make you feel generally unwell. This is not uncommon as chemotherapy can affect healthy cells as well as cancer cells. Report any side effects. Continue your course of treatment even though you feel ill unless your care team tells you to stop. In some cases, you may be given additional medications to help with side effects. Follow all directions for their use. This medication may increase your risk of getting an infection. Call your care team for advice if you get a fever, chills, sore throat,  or other symptoms of a cold or flu. Do not treat yourself. Try to avoid being around people who are sick. This medication may increase your risk to bruise or bleed. Call your care team if you notice any unusual bleeding. Be careful brushing or flossing your  teeth or using a toothpick because you may get an infection or bleed more easily. If you have any dental work done, tell your dentist you are receiving this medication. Avoid taking medications that contain aspirin, acetaminophen, ibuprofen, naproxen, or ketoprofen unless instructed by your care team. These medications may hide a fever. Do not treat diarrhea with over the counter products. Contact your care team if you have diarrhea that lasts more than 2 days or if it is severe and watery. This medication can make you more sensitive to the sun. Keep out of the sun. If you cannot avoid being in the sun, wear protective clothing and sunscreen. Do not use sun lamps, tanning beds, or tanning booths. Talk to your care team if you or your partner wish to become pregnant or think you might be pregnant. This medication can cause serious birth defects if taken during pregnancy and for 3 months after the last dose. A reliable form of contraception is recommended while taking this medication and for 3 months after the last dose. Talk to your care team about effective forms of contraception. Do not father a child while taking this medication and for 3 months after the last dose. Use a condom while having sex during this time period. Do not breastfeed while taking this medication. This medication may cause infertility. Talk to your care team if you are concerned about your fertility. What side effects may I notice from receiving this medication? Side effects that you should report to your care team as soon as possible: Allergic reactions--skin rash, itching, hives, swelling of the face, lips, tongue, or throat Heart attack--pain or tightness in the chest, shoulders, arms, or jaw, nausea, shortness of breath, cold or clammy skin, feeling faint or lightheaded Heart failure--shortness of breath, swelling of the ankles, feet, or hands, sudden weight gain, unusual weakness or fatigue Heart rhythm changes--fast or  irregular heartbeat, dizziness, feeling faint or lightheaded, chest pain, trouble breathing High ammonia level--unusual weakness or fatigue, confusion, loss of appetite, nausea, vomiting, seizures Infection--fever, chills, cough, sore throat, wounds that don't heal, pain or trouble when passing urine, general feeling of discomfort or being unwell Low red blood cell level--unusual weakness or fatigue, dizziness, headache, trouble breathing Pain, tingling, or numbness in the hands or feet, muscle weakness, change in vision, confusion or trouble speaking, loss of balance or coordination, trouble walking, seizures Redness, swelling, and blistering of the skin over hands and feet Severe or prolonged diarrhea Unusual bruising or bleeding Side effects that usually do not require medical attention (report to your care team if they continue or are bothersome): Dry skin Headache Increased tears Nausea Pain, redness, or swelling with sores inside the mouth or throat Sensitivity to light Vomiting This list may not describe all possible side effects. Call your doctor for medical advice about side effects. You may report side effects to FDA at 1-800-FDA-1088. Where should I keep my medication? This medication is given in a hospital or clinic. It will not be stored at home. NOTE: This sheet is a summary. It may not cover all possible information. If you have questions about this medicine, talk to your doctor, pharmacist, or health care provider.  2023 Elsevier/Gold Standard (2021-10-14 00:00:00)  Leucovorin Injection What is this medication? LEUCOVORIN (loo koe VOR in) prevents side effects from certain medications, such as methotrexate. It works by increasing folate levels. This helps protect healthy cells in your body. It may also be used to treat anemia caused by low levels of folate. It can also be used with fluorouracil, a type of chemotherapy, to treat colorectal cancer. It works by increasing  the effects of fluorouracil in the body. This medicine may be used for other purposes; ask your health care provider or pharmacist if you have questions. What should I tell my care team before I take this medication? They need to know if you have any of these conditions: Anemia from low levels of vitamin B12 in the blood An unusual or allergic reaction to leucovorin, folic acid, other medications, foods, dyes, or preservatives Pregnant or trying to get pregnant Breastfeeding How should I use this medication? This medication is injected into a vein or a muscle. It is given by your care team in a hospital or clinic setting. Talk to your care team about the use of this medication in children. Special care may be needed. Overdosage: If you think you have taken too much of this medicine contact a poison control center or emergency room at once. NOTE: This medicine is only for you. Do not share this medicine with others. What if I miss a dose? Keep appointments for follow-up doses. It is important not to miss your dose. Call your care team if you are unable to keep an appointment. What may interact with this medication? Capecitabine Fluorouracil Phenobarbital Phenytoin Primidone Trimethoprim;sulfamethoxazole This list may not describe all possible interactions. Give your health care provider a list of all the medicines, herbs, non-prescription drugs, or dietary supplements you use. Also tell them if you smoke, drink alcohol, or use illegal drugs. Some items may interact with your medicine. What should I watch for while using this medication? Your condition will be monitored carefully while you are receiving this medication. This medication may increase the side effects of 5-fluorouracil. Tell your care team if you have diarrhea or mouth sores that do not get better or that get worse. What side effects may I notice from receiving this medication? Side effects that you should report to your care  team as soon as possible: Allergic reactions--skin rash, itching, hives, swelling of the face, lips, tongue, or throat This list may not describe all possible side effects. Call your doctor for medical advice about side effects. You may report side effects to FDA at 1-800-FDA-1088. Where should I keep my medication? This medication is given in a hospital or clinic. It will not be stored at home. NOTE: This sheet is a summary. It may not cover all possible information. If you have questions about this medicine, talk to your doctor, pharmacist, or health care provider.  2023 Elsevier/Gold Standard (2021-11-18 00:00:00)    Bevacizumab Injection What is this medication? BEVACIZUMAB (be va SIZ yoo mab) treats some types of cancer. It works by blocking a protein that causes cancer cells to grow and multiply. This helps to slow or stop the spread of cancer cells. It is a monoclonal antibody. This medicine may be used for other purposes; ask your health care provider or pharmacist if you have questions. COMMON BRAND NAME(S): Alymsys, Avastin, MVASI, Susan Martin What should I tell my care team before I take this medication? They need to know if you have any of these conditions: Blood clots Coughing up blood Having or recent  surgery Heart failure High blood pressure History of a connection between 2 or more body parts that do not usually connect (fistula) History of a tear in your stomach or intestines Protein in your urine An unusual or allergic reaction to bevacizumab, other medications, foods, dyes, or preservatives Pregnant or trying to get pregnant Breast-feeding How should I use this medication? This medication is injected into a vein. It is given by your care team in a hospital or clinic setting. Talk to your care team the use of this medication in children. Special care may be needed. Overdosage: If you think you have taken too much of this medicine contact a poison control center or  emergency room at once. NOTE: This medicine is only for you. Do not share this medicine with others. What if I miss a dose? Keep appointments for follow-up doses. It is important not to miss your dose. Call your care team if you are unable to keep an appointment. What may interact with this medication? Interactions are not expected. This list may not describe all possible interactions. Give your health care provider a list of all the medicines, herbs, non-prescription drugs, or dietary supplements you use. Also tell them if you smoke, drink alcohol, or use illegal drugs. Some items may interact with your medicine. What should I watch for while using this medication? Your condition will be monitored carefully while you are receiving this medication. You may need blood work while taking this medication. This medication may make you feel generally unwell. This is not uncommon as chemotherapy can affect healthy cells as well as cancer cells. Report any side effects. Continue your course of treatment even though you feel ill unless your care team tells you to stop. This medication may increase your risk to bruise or bleed. Call your care team if you notice any unusual bleeding. Before having surgery, talk to your care team to make sure it is ok. This medication can increase the risk of poor healing of your surgical site or wound. You will need to stop this medication for 28 days before surgery. After surgery, wait at least 28 days before restarting this medication. Make sure the surgical site or wound is healed enough before restarting this medication. Talk to your care team if questions. Talk to your care team if you may be pregnant. Serious birth defects can occur if you take this medication during pregnancy and for 6 months after the last dose. Contraception is recommended while taking this medication and for 6 months after the last dose. Your care team can help you find the option that works for you. Do  not breastfeed while taking this medication and for 6 months after the last dose. This medication can cause infertility. Talk to your care team if you are concerned about your fertility. What side effects may I notice from receiving this medication? Side effects that you should report to your care team as soon as possible: Allergic reactions--skin rash, itching, hives, swelling of the face, lips, tongue, or throat Bleeding--bloody or black, tar-like stools, vomiting blood or Susan Martin material that looks like coffee grounds, red or dark Susan Martin urine, small red or purple spots on skin, unusual bruising or bleeding Blood clot--pain, swelling, or warmth in the leg, shortness of breath, chest pain Heart attack--pain or tightness in the chest, shoulders, arms, or jaw, nausea, shortness of breath, cold or clammy skin, feeling faint or lightheaded Heart failure--shortness of breath, swelling of the ankles, feet, or hands, sudden weight gain, unusual  weakness or fatigue Increase in blood pressure Infection--fever, chills, cough, sore throat, wounds that don't heal, pain or trouble when passing urine, general feeling of discomfort or being unwell Infusion reactions--chest pain, shortness of breath or trouble breathing, feeling faint or lightheaded Kidney injury--decrease in the amount of urine, swelling of the ankles, hands, or feet Stomach pain that is severe, does not go away, or gets worse Stroke--sudden numbness or weakness of the face, arm, or leg, trouble speaking, confusion, trouble walking, loss of balance or coordination, dizziness, severe headache, change in vision Sudden and severe headache, confusion, change in vision, seizures, which may be signs of posterior reversible encephalopathy syndrome (PRES) Side effects that usually do not require medical attention (report to your care team if they continue or are bothersome): Back pain Change in taste Diarrhea Dry skin Increased tears Nosebleed This  list may not describe all possible side effects. Call your doctor for medical advice about side effects. You may report side effects to FDA at 1-800-FDA-1088. Where should I keep my medication? This medication is given in a hospital or clinic. It will not be stored at home. NOTE: This sheet is a summary. It may not cover all possible information. If you have questions about this medicine, talk to your doctor, pharmacist, or health care provider.  2023 Elsevier/Gold Standard (2021-10-17 00:00:00)        To help prevent nausea and vomiting after your treatment, we encourage you to take your nausea medication as directed.  BELOW ARE SYMPTOMS THAT SHOULD BE REPORTED IMMEDIATELY: *FEVER GREATER THAN 100.4 F (38 C) OR HIGHER *CHILLS OR SWEATING *NAUSEA AND VOMITING THAT IS NOT CONTROLLED WITH YOUR NAUSEA MEDICATION *UNUSUAL SHORTNESS OF BREATH *UNUSUAL BRUISING OR BLEEDING *URINARY PROBLEMS (pain or burning when urinating, or frequent urination) *BOWEL PROBLEMS (unusual diarrhea, constipation, pain near the anus) TENDERNESS IN MOUTH AND THROAT WITH OR WITHOUT PRESENCE OF ULCERS (sore throat, sores in mouth, or a toothache) UNUSUAL RASH, SWELLING OR PAIN  UNUSUAL VAGINAL DISCHARGE OR ITCHING   Items with * indicate a potential emergency and should be followed up as soon as possible or go to the Emergency Department if any problems should occur.  Please show the CHEMOTHERAPY ALERT CARD or IMMUNOTHERAPY ALERT CARD at check-in to the Emergency Department and triage nurse.  Should you have questions after your visit or need to cancel or reschedule your appointment, please contact [facility] [phone]  and follow the prompts.  Office hours are 8:00 a.m. to 4:30 p.m. Monday - Friday. Please note that voicemails left after 4:00 p.m. may not be returned until the following business day.  We are closed weekends and major holidays. You have access to a nurse at all times for  urgent questions. Please call the main number to the clinic [phone] and follow the prompts.  For any non-urgent questions, you may also contact your provider using MyChart. We now offer e-Visits for anyone 60 and older to request care online for non-urgent symptoms. For details visit [email or web address].   Also download the MyChart app! Go to the app store, search "MyChart", open the app, select [facility], and log in with your MyChart username and password.  Masks are optional in the cancer centers. If you would like for your care team to wear a mask while they are taking care of you, please let them know. You may have one support person who is at least 62 years old accompany you for your appointments.

## 2022-06-10 NOTE — Progress Notes (Unsigned)
Patient presents today for chemotherapy infusion.  Patient is in satisfactory condition with no complaints voiced.  Vital signs are stable.  Labs reviewed and all labs are within treatment parameters.  We will proceed with treatment per MD orders.   Patient tolerated treatment well with no complaints voiced.  Home infusion 5FU pump connected with no issues.  Patient left ambulatory in stable condition.  Vital signs stable at discharge.  Follow up as scheduled.

## 2022-06-12 ENCOUNTER — Inpatient Hospital Stay: Payer: Medicaid Other

## 2022-06-12 VITALS — BP 160/93 | HR 76 | Temp 97.4°F | Resp 18

## 2022-06-12 DIAGNOSIS — C189 Malignant neoplasm of colon, unspecified: Secondary | ICD-10-CM

## 2022-06-12 DIAGNOSIS — Z5111 Encounter for antineoplastic chemotherapy: Secondary | ICD-10-CM | POA: Diagnosis not present

## 2022-06-12 MED ORDER — SODIUM CHLORIDE 0.9% FLUSH
10.0000 mL | INTRAVENOUS | Status: DC | PRN
  Administered 2022-06-12: 10 mL

## 2022-06-12 MED ORDER — HEPARIN SOD (PORK) LOCK FLUSH 100 UNIT/ML IV SOLN
500.0000 [IU] | Freq: Once | INTRAVENOUS | Status: AC | PRN
  Administered 2022-06-12: 500 [IU]

## 2022-06-12 NOTE — Patient Instructions (Signed)
[facility]  Discharge Instructions: Thank you for choosing [facility] to provide your oncology and hematology care.  If you have a lab appointment with the [facility], please come in thru the Main Entrance and check in at the main information desk.  Wear comfortable clothing and clothing appropriate for easy access to any Portacath or PICC line.   We strive to give you quality time with your provider. You may need to reschedule your appointment if you arrive late (15 or more minutes).  Arriving late affects you and other patients whose appointments are after yours.  Also, if you miss three or more appointments without notifying the office, you may be dismissed from the clinic at the provider's discretion.      For prescription refill requests, have your pharmacy contact our office and allow 72 hours for refills to be completed.    Today you received pump d/c     BELOW ARE SYMPTOMS THAT SHOULD BE REPORTED IMMEDIATELY: *FEVER GREATER THAN 100.4 F (38 C) OR HIGHER *CHILLS OR SWEATING *NAUSEA AND VOMITING THAT IS NOT CONTROLLED WITH YOUR NAUSEA MEDICATION *UNUSUAL SHORTNESS OF BREATH *UNUSUAL BRUISING OR BLEEDING *URINARY PROBLEMS (pain or burning when urinating, or frequent urination) *BOWEL PROBLEMS (unusual diarrhea, constipation, pain near the anus) TENDERNESS IN MOUTH AND THROAT WITH OR WITHOUT PRESENCE OF ULCERS (sore throat, sores in mouth, or a toothache) UNUSUAL RASH, SWELLING OR PAIN  UNUSUAL VAGINAL DISCHARGE OR ITCHING   Items with * indicate a potential emergency and should be followed up as soon as possible or go to the Emergency Department if any problems should occur.  Please show the CHEMOTHERAPY ALERT CARD or IMMUNOTHERAPY ALERT CARD at check-in to the Emergency Department and triage nurse.  Should you have questions after your visit or need to cancel or reschedule your appointment, please contact [facility]  [phone]  and follow the prompts.  Office hours are 8:00 a.m. to 4:30 p.m. Monday - Friday. Please note that voicemails left after 4:00 p.m. may not be returned until the following business day.  We are closed weekends and major holidays. You have access to a nurse at all times for urgent questions. Please call the main number to the clinic [phone] and follow the prompts.  For any non-urgent questions, you may also contact your provider using MyChart. We now offer e-Visits for anyone 10 and older to request care online for non-urgent symptoms. For details visit [email or web address].   Also download the MyChart app! Go to the app store, search "MyChart", open the app, select [facility], and log in with your MyChart username and password.  Masks are optional in the cancer centers. If you would like for your care team to wear a mask while they are taking care of you, please let them know. You may have one support person who is at least 62 years old accompany you for your appointments.

## 2022-06-12 NOTE — Progress Notes (Signed)
Pt presents today for 5FU chemotherapy pump disconnection per provider's order. Vital signs stable and pt voiced no new complaints at this time. Port flushed easily without difficulty with 10 mL of normal saline and 5 mL of heparin. Good blood return noted and needle removed intact. No bruising or swelling noted at the site.   Discharged from clinic ambulatory in stable condition. Alert and oriented x 3. F/U with [facility] as scheduled.

## 2022-06-15 ENCOUNTER — Ambulatory Visit: Payer: Medicaid Other | Admitting: Hematology

## 2022-06-15 ENCOUNTER — Other Ambulatory Visit: Payer: Medicaid Other

## 2022-06-15 ENCOUNTER — Ambulatory Visit: Payer: Medicaid Other

## 2022-06-16 NOTE — Progress Notes (Signed)
The following biosimilar Vegzelma (bevacizumab-adcd)  has been selected for use in this patient.   Susan Martin, PharmD 06/16/22 @ [phone]

## 2022-06-24 ENCOUNTER — Inpatient Hospital Stay (HOSPITAL_BASED_OUTPATIENT_CLINIC_OR_DEPARTMENT_OTHER): Payer: Medicaid Other | Admitting: Hematology

## 2022-06-24 ENCOUNTER — Inpatient Hospital Stay: Payer: Medicaid Other

## 2022-06-24 VITALS — BP 143/82 | HR 85 | Temp 98.1°F | Resp 17 | Wt 172.4 lb

## 2022-06-24 DIAGNOSIS — C787 Secondary malignant neoplasm of liver and intrahepatic bile duct: Secondary | ICD-10-CM

## 2022-06-24 DIAGNOSIS — Z95828 Presence of other vascular implants and grafts: Secondary | ICD-10-CM

## 2022-06-24 DIAGNOSIS — C189 Malignant neoplasm of colon, unspecified: Secondary | ICD-10-CM

## 2022-06-24 DIAGNOSIS — Z5111 Encounter for antineoplastic chemotherapy: Secondary | ICD-10-CM | POA: Diagnosis not present

## 2022-06-24 LAB — COMPREHENSIVE METABOLIC PANEL
ALT: 40 U/L (ref 0–44)
AST: 42 U/L — ABNORMAL HIGH (ref 15–41)
Albumin: 3.6 g/dL (ref 3.5–5.0)
Alkaline Phosphatase: 82 U/L (ref 38–126)
Anion gap: 8 (ref 5–15)
BUN: 11 mg/dL (ref 8–23)
CO2: 27 mmol/L (ref 22–32)
Calcium: 9.1 mg/dL (ref 8.9–10.3)
Chloride: 100 mmol/L (ref 98–111)
Creatinine, Ser: 0.87 mg/dL (ref 0.44–1.00)
GFR, Estimated: >60 mL/min (ref >60)
Glucose, Bld: 129 mg/dL — ABNORMAL HIGH (ref 70–99)
Potassium: 3.5 mmol/L (ref 3.5–5.1)
Sodium: 135 mmol/L (ref 135–145)
Total Bilirubin: 0.6 mg/dL (ref 0.3–1.2)
Total Protein: 7 g/dL (ref 6.5–8.1)

## 2022-06-24 LAB — MAGNESIUM: Magnesium: 1.9 mg/dL (ref 1.7–2.4)

## 2022-06-24 LAB — URINALYSIS, DIPSTICK ONLY
Bilirubin Urine: NEGATIVE
Glucose, UA: NEGATIVE mg/dL
Hgb urine dipstick: NEGATIVE
Ketones, ur: NEGATIVE mg/dL
Leukocytes,Ua: NEGATIVE
Nitrite: NEGATIVE
Protein, ur: NEGATIVE mg/dL
Specific Gravity, Urine: 1.01 (ref 1.005–1.030)
pH: 7 (ref 5.0–8.0)

## 2022-06-24 LAB — CBC WITH DIFFERENTIAL/PLATELET
Abs Immature Granulocytes: 0.01 10*3/uL (ref 0.00–0.07)
Basophils Absolute: 0 10*3/uL (ref 0.0–0.1)
Basophils Relative: 1 %
Eosinophils Absolute: 0.1 10*3/uL (ref 0.0–0.5)
Eosinophils Relative: 2 %
HCT: 39.7 % (ref 36.0–46.0)
Hemoglobin: 13.5 g/dL (ref 12.0–15.0)
Immature Granulocytes: 0 %
Lymphocytes Relative: 31 %
Lymphs Abs: 1.9 10*3/uL (ref 0.7–4.0)
MCH: 31.5 pg (ref 26.0–34.0)
MCHC: 34 g/dL (ref 30.0–36.0)
MCV: 92.8 fL (ref 80.0–100.0)
Monocytes Absolute: 0.6 10*3/uL (ref 0.1–1.0)
Monocytes Relative: 10 %
Neutro Abs: 3.4 10*3/uL (ref 1.7–7.7)
Neutrophils Relative %: 56 %
Platelets: 124 10*3/uL — ABNORMAL LOW (ref 150–400)
RBC: 4.28 MIL/uL (ref 3.87–5.11)
RDW: 16.8 % — ABNORMAL HIGH (ref 11.5–15.5)
WBC: 6.1 10*3/uL (ref 4.0–10.5)
nRBC: 0 % (ref 0.0–0.2)

## 2022-06-24 MED ORDER — SODIUM CHLORIDE 0.9 % IV SOLN
10.0000 mg | Freq: Once | INTRAVENOUS | Status: AC
  Administered 2022-06-24: 10 mg via INTRAVENOUS
  Filled 2022-06-24: qty 10

## 2022-06-24 MED ORDER — SODIUM CHLORIDE 0.9 % IV SOLN: Freq: Once | INTRAVENOUS | Status: AC

## 2022-06-24 MED ORDER — FLUOROURACIL CHEMO INJECTION 2.5 GM/50ML
270.0000 mg/m2 | Freq: Once | INTRAVENOUS | Status: AC
  Administered 2022-06-24: 550 mg via INTRAVENOUS
  Filled 2022-06-24: qty 11

## 2022-06-24 MED ORDER — SODIUM CHLORIDE 0.9 % IV SOLN
3250.0000 mg | INTRAVENOUS | Status: DC
  Administered 2022-06-24: 3250 mg via INTRAVENOUS
  Filled 2022-06-24: qty 65

## 2022-06-24 MED ORDER — SODIUM CHLORIDE 0.9% FLUSH
10.0000 mL | Freq: Once | INTRAVENOUS | Status: AC
  Administered 2022-06-24: 10 mL via INTRAVENOUS

## 2022-06-24 MED ORDER — SODIUM CHLORIDE 0.9 % IV SOLN
320.0000 mg/m2 | Freq: Once | INTRAVENOUS | Status: AC
  Administered 2022-06-24: 628 mg via INTRAVENOUS
  Filled 2022-06-24: qty 31.4

## 2022-06-24 MED ORDER — PALONOSETRON HCL INJECTION 0.25 MG/5ML
0.2500 mg | Freq: Once | INTRAVENOUS | Status: AC
  Administered 2022-06-24: 0.25 mg via INTRAVENOUS
  Filled 2022-06-24: qty 5

## 2022-06-24 MED ORDER — SODIUM CHLORIDE 0.9 % IV SOLN
5.0000 mg/kg | Freq: Once | INTRAVENOUS | Status: AC
  Administered 2022-06-24: 400 mg via INTRAVENOUS
  Filled 2022-06-24: qty 16

## 2022-06-24 NOTE — Progress Notes (Signed)
Pt presents today for Bevacizumab adcd, Leucovorin, and 5FU per provider's order. Vital signs and labs WNL for treatment. Okay to proceed with treatment today per Dr.K.  Treatment given today per MD orders. Tolerated infusion without adverse affects. Vital signs stable. No complaints at this time. Discharged from clinic ambulatory in stable condition. Alert and oriented x 3. F/U with [facility] as scheduled. 5FU ambulatory pump infusing.

## 2022-06-24 NOTE — Progress Notes (Signed)
[name], [place] [address]   CLINIC:  Medical Oncology/Hematology  PCP:  Patient, No Pcp Per None None   REASON FOR VISIT:  Follow-up for metastatic colon cancer to liver  PRIOR THERAPY: Sigmoid colectomy and end colostomy on 04/29/2020  NGS Results: Foundation 1 KRAS wildtype, MS--stable, TMB 4 Muts/Mb  CURRENT THERAPY: Maintenance 5-FU and bevacizumab  BRIEF ONCOLOGIC HISTORY:  Oncology History  Metastatic colon cancer to liver ([place])  05/16/2020 Initial Diagnosis   Metastatic colon cancer to liver ([place])   06/03/2020 Genetic Testing   Foundation One:     06/04/2020 - 02/25/2022 Chemotherapy   Patient is on Treatment Plan : COLORECTAL FOLFOX + Bevacizumab q14d     06/04/2020 -  Chemotherapy   Patient is on Treatment Plan : COLORECTAL FOLFOX + Bevacizumab q14d       CANCER STAGING:  Cancer Staging  No matching staging information was found for the patient.  INTERVAL HISTORY:  [name], a 61 y.o. female, seen for follow-up and toxicity assessment prior to next cycle of maintenance 5-FU and bevacizumab.  Reports energy levels are 50%.  Occasionally she gets depressed.  She started back on gabapentin 100 mg 3 times daily as she developed some numbness in the lips and right hand after last treatment.  She is also lost some weight as she was feeling depressed during Christmas time.  She reported her mouth was sore for couple of days after last treatment.  REVIEW OF SYSTEMS:  Review of Systems  Constitutional:  Negative for appetite change and fatigue.  HENT:   Negative for nosebleeds.   Gastrointestinal:  Negative for abdominal pain and diarrhea.  Neurological:  Positive for numbness (Right hand and lips).  Hematological:  Does not bruise/bleed easily.  All other systems reviewed and are negative.   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 Delton Coombes   04-29-2020 [facility] surgery for perforated colon s/p sigmoid colectomy w/ colostomy; dx  Stage IV colon cancer mets to liver   Anxiety    Depression    Hypertension    followed by dr Glo Herring and oncology  (05-27-2020 per pt does not have pcp yet)   IDA (iron deficiency anemia)    Pulmonary nodule, right    Past Surgical History:  Procedure Laterality Date   ABDOMINAL HYSTERECTOMY  03-31-2016   _0    W/  BILATERAL SALPINOOPHORECTOMY    APPLICATION OF WOUND VAC N/A 04/29/2020   Procedure: APPLICATION OF WOUND VAC;  Surgeon: Mickeal Skinner, MD;  Location: [facility];  Service: General;  Laterality: N/A;   ENDOMETRIAL ABLATION     LAPAROTOMY N/A 04/29/2020   Procedure: EXPLORATORY LAPAROTOMY;  Surgeon: Mickeal Skinner, MD;  Location: [facility];  Service: General;  Laterality: N/A;   PARTIAL COLECTOMY N/A 04/29/2020   Procedure: PARTIAL COLECTOMY WITH END COLOSTOMY;  Surgeon: Kieth Brightly Arta Bruce, MD;  Location: [facility];  Service: General;  Laterality: N/A;   PORTACATH PLACEMENT Right 05/28/2020   Procedure: INSERTION PORT-A-CATH;  Surgeon: Kinsinger, Arta Bruce, MD;  Location[facility];  Service: General;  Laterality: Right;   SALPINGOOPHORECTOMY Left 03/31/2016   Procedure: LEFT SALPINGO OOPHORECTOMY WITH FROZEN SECTION,  ABDOMINAL HYSTERECTOMY WITH BILATERAL SALPINGO-OOPHORECTOMY;  Surgeon: Jonnie Kind, MD;  Location: [facility];  Service: Gynecology;  Laterality: Left;    SOCIAL HISTORY:  Social History   Socioeconomic  History   Marital status: Divorced    Spouse name: Not on file   Number of children: Not on file   Years of education: Not on file   Highest education level: Not on file  Occupational History   Not on file  Tobacco Use   Smoking status: Former    Packs/day: 0.50    Years: 5.00    Total pack years: 2.50    Types: Cigarettes    Quit date: 03/28/2015    Years since quitting: 7.2   Smokeless tobacco: Never   Vaping Use   Vaping Use: Never used  Substance and Sexual Activity   Alcohol use: No   Drug use: Never   Sexual activity: Yes    Partners: Male    Birth control/protection: Surgical  Other Topics Concern   Not on file  Social History Narrative   Not on file   Social Determinants of Health   Financial Resource Strain: Low Risk  (11/01/2019)   Overall Financial Resource Strain (CARDIA)    Difficulty of Paying Living Expenses: Not very hard  Food Insecurity: No Food Insecurity (11/01/2019)   Hunger Vital Sign    Worried About Running Out of Food in the Last Year: Never true    Ran Out of Food in the Last Year: Never true  Transportation Needs: No Transportation Needs (11/01/2019)   PRAPARE - Hydrologist (Medical): No    Lack of Transportation (Non-Medical): No  Physical Activity: Insufficiently Active (11/01/2019)   Exercise Vital Sign    Days of Exercise per Week: 2 days    Minutes of Exercise per Session: 30 min  Stress: Stress Concern Present (11/01/2019)   [place]    Feeling of Stress : To some extent  Social Connections: Moderately Integrated (11/01/2019)   Social Connection and Isolation Panel [NHANES]    Frequency of Communication with Friends and Family: More than three times a week    Frequency of Social Gatherings with Friends and Family: Once a week    Attends Religious Services: More than 4 times per year    Active Member of [employer] or Organizations: No    Attends Archivist Meetings: Never    Marital Status: Living with partner  Intimate Partner Violence: Not At Risk (11/01/2019)   Humiliation, Afraid, Rape, and Kick questionnaire    Fear of Current or Ex-Partner: No    Emotionally Abused: No    Physically Abused: No    Sexually Abused: No    FAMILY HISTORY:  Family History  Problem Relation Age of Onset   Hypertension Mother    Diabetes Mother    Cirrhosis  Father     CURRENT MEDICATIONS:  Current Outpatient Medications  Medication Sig Dispense Refill   amLODipine (NORVASC) 5 MG tablet TAKE 1 TABLET (5 MG TOTAL) BY MOUTH DAILY. 90 tablet 1   BEVACIZUMAB IV Inject 5 mg/kg into the vein every 14 (fourteen) days.     clonazePAM (KLONOPIN) 1 MG tablet Take 1 tablet (1 mg total) by mouth in the morning, at noon, and at bedtime. 90 tablet 3   fluorouracil CALGB 56387 in sodium chloride 0.9 % 150 mL Inject 2,400 mg/m2 into the vein over 48 hr.     gabapentin (NEURONTIN) 100 MG capsule TAKE 1 CAPSULE (100 MG TOTAL) BY MOUTH THREE TIMES DAILY. 90 capsule 3   KLOR-CON M10 10 MEQ tablet TAKE 2 TABLETS (20 MEQ TOTAL)  BY MOUTH 3 (THREE) TIMES DAILY. 540 tablet 1   LEUCOVORIN CALCIUM IV Inject 400 mg/m2 into the vein every 21 ( twenty-one) days.     lidocaine (XYLOCAINE) 2 % solution Use as directed 15 mLs in the mouth or throat every 6 (six) hours as needed for mouth pain. Swish and spit/swallow every six hours as needed for mouth pain 480 mL 3   nystatin (MYCOSTATIN) 100000 UNIT/ML suspension Take by mouth.     PARoxetine (PAXIL) 40 MG tablet Take 1 tablet (40 mg total) by mouth every morning. 90 tablet 3   polyethylene glycol (MIRALAX / GLYCOLAX) packet Take 17 g by mouth every other day.     triamterene-hydrochlorothiazide (MAXZIDE-25) 37.5-25 MG tablet TAKE 1 TABLET BY MOUTH EVERY DAY 90 tablet 1   No current facility-administered medications for this visit.   Facility-Administered Medications Ordered in Other Visits  Medication Dose Route Frequency Provider Last Rate Last Admin   fluorouracil (ADRUCIL) 3,250 mg in sodium chloride 0.9 % 85 mL chemo infusion  3,250 mg Intravenous 1 day or 1 dose Derek Jack, MD   Infusion Verify at 06/24/22 1443    ALLERGIES:  No Known Allergies  PHYSICAL EXAM:  Performance status (ECOG): 1 - Symptomatic but completely ambulatory  There were no vitals filed for this visit.  Wt Readings from Last 3  Encounters:  06/24/22 172 lb 6.4 oz (78.2 kg)  06/10/22 188 lb 3.2 oz (85.4 kg)  05/27/22 186 lb 8 oz (84.6 kg)   Physical Exam Vitals reviewed.  Constitutional:      Appearance: Normal appearance.  Cardiovascular:     Rate and Rhythm: Normal rate and regular rhythm.     Pulses: Normal pulses.     Heart sounds: Normal heart sounds.  Pulmonary:     Effort: Pulmonary effort is normal.     Breath sounds: Normal breath sounds.  Abdominal:     Palpations: Abdomen is soft. There is no mass.     Tenderness: There is no abdominal tenderness.  Musculoskeletal:     Right lower leg: No edema.     Left lower leg: No edema.  Neurological:     General: No focal deficit present.     Mental Status: She is alert and oriented to person, place, and time.  Psychiatric:        Mood and Affect: Mood normal.        Behavior: Behavior normal.     LABORATORY DATA:  I have reviewed the labs as listed.     Latest Ref Rng & Units 06/24/2022    9:11 AM 06/10/2022    9:39 AM 05/27/2022    9:12 AM  CBC  WBC 4.0 - 10.5 K/uL 6.1  5.9  6.0   Hemoglobin 12.0 - 15.0 g/dL 13.5  13.5  14.2   Hematocrit 36.0 - 46.0 % 39.7  40.5  42.3   Platelets 150 - 400 K/uL 124  133  169       Latest Ref Rng & Units 06/24/2022    9:11 AM 06/10/2022    9:39 AM 05/27/2022    9:12 AM  CMP  Glucose 70 - 99 mg/dL 129  155  154   BUN 8 - 23 mg/dL _0 Creatinine 0.44 - 1.00 mg/dL 0.87  0.86  0.87   Sodium 135 - 145 mmol/L 135  136  136   Potassium 3.5 - 5.1 mmol/L 3.5  3.5  3.5  Chloride 98 - 111 mmol/L 100  101  100   CO2 22 - 32 mmol/L _0 Calcium 8.9 - 10.3 mg/dL 9.1  8.9  9.1   Total Protein 6.5 - 8.1 g/dL 7.0  7.2  7.7   Total Bilirubin 0.3 - 1.2 mg/dL 0.6  0.8  0.7   Alkaline Phos 38 - 126 U/L 82  95  101   AST 15 - 41 U/L 42  39  44   ALT 0 - 44 U/L 40  32  34     DIAGNOSTIC IMAGING:  I have independently reviewed the scans and discussed with the patient. No results found.    ASSESSMENT:  1.  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 -CT chest on 05/02/2020 with no evidence of pulmonary metastatic disease. -CTAP on 05/07/2020 with multiple lesions throughout the liver, largest in the right lobe measuring 3.9 cm, lateral segment left lobe measuring 2.6 cm and inferior right lobe confluent lesion 4.2 x 3.7 cm.  No lymphadenopathy. -CEA on 05/02/2020-60.2. -FOLFOX and bevacizumab started on 06/04/2020. 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   2.  Iron deficiency anemia: -Feraheme on 04/30/2020 and 05/06/2020.   3.  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 -Smoked for 3 to 4 years and quit. -Mother had cancer, type unknown.  Maternal uncle had lung cancer.  Maternal aunt had lung cancer.   PLAN:  1.  Metastatic colon adenocarcinoma to liver, MS-stable: - CT CAP on 05/07/2022: Liver lesions have slightly improved.  Similar lung findings.  No new progressive disease. - CEA was 2.5 on 05/27/2022. - Reviewed labs today which showed AST of 42 and rest of LFTs are normal.  CBC was grossly normal with mild thrombocytopenia.  Urine is negative for protein. - Proceed with maintenance 5-FU and bevacizumab today and in 2 weeks.  RTC 4 weeks for follow-up with repeat labs.  Will plan to repeat CT scan in 3 months.   2.  Hypertension: - Continue triamterene/HCTZ and Norvasc 5 mg daily.  Blood pressure is 140/80.   3.  Severe hypokalemia: - Continue potassium 10 mEq 3 tablets daily.  Potassium is 3.5.   4.  Peripheral neuropathy: - Continue gabapentin 100 mg 3 times daily.   5.  Anxiety: - Continue Klonopin 1 mg 3 times daily.  Continue Paxil 40 mg  daily.  6.  Mucositis: - She has soreness in the mouth for 2 days after each treatment.  Continue Azlin and lidocaine gel as needed.   Orders placed this encounter:  Orders Placed This Encounter  Procedures   CT [place] [place], [place] [phone]

## 2022-06-24 NOTE — Progress Notes (Signed)
Patient has been examined by Dr. Katragadda, and vital signs and labs have been reviewed. ANC, Creatinine, LFTs, hemoglobin, and platelets are within treatment parameters per M.D. - pt may proceed with treatment.  Primary RN and pharmacy notified.  

## 2022-06-24 NOTE — Patient Instructions (Signed)
[facility]  Discharge Instructions: Thank you for choosing [facility] to provide your oncology and hematology care.  If you have a lab appointment with the [facility], please come in thru the Main Entrance and check in at the main information desk.  Wear comfortable clothing and clothing appropriate for easy access to any Portacath or PICC line.   We strive to give you quality time with your provider. You may need to reschedule your appointment if you arrive late (15 or more minutes).  Arriving late affects you and other patients whose appointments are after yours.  Also, if you miss three or more appointments without notifying the office, you may be dismissed from the clinic at the provider's discretion.      For prescription refill requests, have your pharmacy contact our office and allow 72 hours for refills to be completed.    Today you received the following chemotherapy and/or immunotherapy agents Bevacizumab, Leucovorin, and 5FU.   To help prevent nausea and vomiting after your treatment, we encourage you to take your nausea medication as directed.  BELOW ARE SYMPTOMS THAT SHOULD BE REPORTED IMMEDIATELY: *FEVER GREATER THAN 100.4 F (38 C) OR HIGHER *CHILLS OR SWEATING *NAUSEA AND VOMITING THAT IS NOT CONTROLLED WITH YOUR NAUSEA MEDICATION *UNUSUAL SHORTNESS OF BREATH *UNUSUAL BRUISING OR BLEEDING *URINARY PROBLEMS (pain or burning when urinating, or frequent urination) *BOWEL PROBLEMS (unusual diarrhea, constipation, pain near the anus) TENDERNESS IN MOUTH AND THROAT WITH OR WITHOUT PRESENCE OF ULCERS (sore throat, sores in mouth, or a toothache) UNUSUAL RASH, SWELLING OR PAIN  UNUSUAL VAGINAL DISCHARGE OR ITCHING   Items with * indicate a potential emergency and should be followed up as soon as possible or go to the Emergency Department if any problems should occur.  Please show the CHEMOTHERAPY ALERT CARD or IMMUNOTHERAPY ALERT CARD at  check-in to the Emergency Department and triage nurse.  Should you have questions after your visit or need to cancel or reschedule your appointment, please contact [facility] [phone]  and follow the prompts.  Office hours are 8:00 a.m. to 4:30 p.m. Monday - Friday. Please note that voicemails left after 4:00 p.m. may not be returned until the following business day.  We are closed weekends and major holidays. You have access to a nurse at all times for urgent questions. Please call the main number to the clinic [phone] and follow the prompts.  For any non-urgent questions, you may also contact your provider using MyChart. We now offer e-Visits for anyone 58 and older to request care online for non-urgent symptoms. For details visit [email or web address].   Also download the MyChart app! Go to the app store, search "MyChart", open the app, select [facility], and log in with your MyChart username and password.

## 2022-06-24 NOTE — Patient Instructions (Addendum)
[facility] Discharge Instructions   You were seen and examined today by Dr. Katragadda.  He reviewed the results of your lab work which are normal/stable.   We will proceed with your treatment today.  Return as scheduled.    Thank you for choosing [facility] at [facility] to provide your oncology and hematology care.  To afford each patient quality time with our provider, please arrive at least 15 minutes before your scheduled appointment time.   If you have a lab appointment with the Cancer Center please come in thru the Main Entrance and check in at the main information desk.  You need to re-schedule your appointment should you arrive 10 or more minutes late.  We strive to give you quality time with our providers, and arriving late affects you and other patients whose appointments are after yours.  Also, if you no show three or more times for appointments you may be dismissed from the clinic at the providers discretion.     Again, thank you for choosing [facility].  Our hope is that these requests will decrease the amount of time that you wait before being seen by our physicians.       _____________________________________________________________  Should you have questions after your visit to [facility], please contact our office at [phone] and follow the prompts.  Our office hours are 8:00 a.m. and 4:30 p.m. Monday - Friday.  Please note that voicemails left after 4:00 p.m. may not be returned until the following business day.  We are closed weekends and major holidays.  You do have access to a nurse 24-7, just call the main number to the clinic [phone] and do not press any options, hold on the line and a nurse will answer the phone.    For prescription refill requests, have your pharmacy contact our office and allow 72 hours.    Due to Covid, you will need to wear a mask upon entering  the hospital. If you do not have a mask, a mask will be given to you at the Main Entrance upon arrival. For doctor visits, patients may have 1 support person age 18 or older with them. For treatment visits, patients can not have anyone with them due to social distancing guidelines and our immunocompromised population.      

## 2022-06-25 ENCOUNTER — Other Ambulatory Visit: Payer: Self-pay

## 2022-06-25 LAB — CEA: CEA: 3.2 ng/mL (ref 0.0–4.7)

## 2022-06-26 ENCOUNTER — Inpatient Hospital Stay: Payer: Medicaid Other

## 2022-06-26 VITALS — BP 160/86 | HR 75 | Temp 98.3°F | Resp 18

## 2022-06-26 DIAGNOSIS — Z5111 Encounter for antineoplastic chemotherapy: Secondary | ICD-10-CM | POA: Diagnosis not present

## 2022-06-26 DIAGNOSIS — C189 Malignant neoplasm of colon, unspecified: Secondary | ICD-10-CM

## 2022-06-26 MED ORDER — SODIUM CHLORIDE 0.9% FLUSH
10.0000 mL | INTRAVENOUS | Status: DC | PRN
  Administered 2022-06-26: 10 mL

## 2022-06-26 MED ORDER — HEPARIN SOD (PORK) LOCK FLUSH 100 UNIT/ML IV SOLN
500.0000 [IU] | Freq: Once | INTRAVENOUS | Status: AC | PRN
  Administered 2022-06-26: 500 [IU]

## 2022-06-26 NOTE — Progress Notes (Signed)
Patient presents today for 5FU pump stop and disconnection after 46 hour continous infusion.   5FU pump deaccessed.  Patients port flushed without difficulty.  Good blood return noted with no bruising or swelling noted at site.  Needle removed intact.  Band aid applied.  VSS with discharge and left in satisfactory condition via wheelchair with no s/s of distress noted.    

## 2022-06-26 NOTE — Patient Instructions (Signed)
[facility]  Discharge Instructions: Thank you for choosing [facility] to provide your oncology and hematology care.  If you have a lab appointment with the Cancer Center, please come in thru the Main Entrance and check in at the main information desk.  Wear comfortable clothing and clothing appropriate for easy access to any Portacath or PICC line.   We strive to give you quality time with your provider. You may need to reschedule your appointment if you arrive late (15 or more minutes).  Arriving late affects you and other patients whose appointments are after yours.  Also, if you miss three or more appointments without notifying the office, you may be dismissed from the clinic at the provider's discretion.      For prescription refill requests, have your pharmacy contact our office and allow 72 hours for refills to be completed.    Today you received the following chemotherapy and/or immunotherapy agents Pump stop      To help prevent nausea and vomiting after your treatment, we encourage you to take your nausea medication as directed.  BELOW ARE SYMPTOMS THAT SHOULD BE REPORTED IMMEDIATELY: *FEVER GREATER THAN 100.4 F (38 C) OR HIGHER *CHILLS OR SWEATING *NAUSEA AND VOMITING THAT IS NOT CONTROLLED WITH YOUR NAUSEA MEDICATION *UNUSUAL SHORTNESS OF BREATH *UNUSUAL BRUISING OR BLEEDING *URINARY PROBLEMS (pain or burning when urinating, or frequent urination) *BOWEL PROBLEMS (unusual diarrhea, constipation, pain near the anus) TENDERNESS IN MOUTH AND THROAT WITH OR WITHOUT PRESENCE OF ULCERS (sore throat, sores in mouth, or a toothache) UNUSUAL RASH, SWELLING OR PAIN  UNUSUAL VAGINAL DISCHARGE OR ITCHING   Items with * indicate a potential emergency and should be followed up as soon as possible or go to the Emergency Department if any problems should occur.  Please show the CHEMOTHERAPY ALERT CARD or IMMUNOTHERAPY ALERT CARD at check-in to the  Emergency Department and triage nurse.  Should you have questions after your visit or need to cancel or reschedule your appointment, please contact [facility] [phone]  and follow the prompts.  Office hours are 8:00 a.m. to 4:30 p.m. Monday - Friday. Please note that voicemails left after 4:00 p.m. may not be returned until the following business day.  We are closed weekends and major holidays. You have access to a nurse at all times for urgent questions. Please call the main number to the clinic [phone] and follow the prompts.  For any non-urgent questions, you may also contact your provider using MyChart. We now offer e-Visits for anyone 18 and older to request care online for non-urgent symptoms. For details visit [email or web address].   Also download the MyChart app! Go to the app store, search "MyChart", open the app, select [facility], and log in with your MyChart username and password.   

## 2022-07-08 ENCOUNTER — Other Ambulatory Visit: Payer: Medicaid Other

## 2022-07-08 ENCOUNTER — Inpatient Hospital Stay: Payer: Medicaid Other

## 2022-07-08 ENCOUNTER — Inpatient Hospital Stay: Payer: Medicaid Other | Attending: Hematology

## 2022-07-08 ENCOUNTER — Ambulatory Visit: Payer: Medicaid Other | Admitting: Hematology

## 2022-07-08 ENCOUNTER — Ambulatory Visit: Payer: Medicaid Other

## 2022-07-08 VITALS — BP 138/81 | HR 75 | Temp 97.4°F | Resp 18

## 2022-07-08 DIAGNOSIS — G629 Polyneuropathy, unspecified: Secondary | ICD-10-CM | POA: Insufficient documentation

## 2022-07-08 DIAGNOSIS — Z87891 Personal history of nicotine dependence: Secondary | ICD-10-CM | POA: Diagnosis not present

## 2022-07-08 DIAGNOSIS — Z90722 Acquired absence of ovaries, bilateral: Secondary | ICD-10-CM | POA: Insufficient documentation

## 2022-07-08 DIAGNOSIS — G35 Multiple sclerosis: Secondary | ICD-10-CM | POA: Insufficient documentation

## 2022-07-08 DIAGNOSIS — D696 Thrombocytopenia, unspecified: Secondary | ICD-10-CM | POA: Insufficient documentation

## 2022-07-08 DIAGNOSIS — Z90710 Acquired absence of both cervix and uterus: Secondary | ICD-10-CM | POA: Diagnosis not present

## 2022-07-08 DIAGNOSIS — C189 Malignant neoplasm of colon, unspecified: Secondary | ICD-10-CM

## 2022-07-08 DIAGNOSIS — C187 Malignant neoplasm of sigmoid colon: Secondary | ICD-10-CM | POA: Diagnosis present

## 2022-07-08 DIAGNOSIS — Z933 Colostomy status: Secondary | ICD-10-CM | POA: Diagnosis not present

## 2022-07-08 DIAGNOSIS — F419 Anxiety disorder, unspecified: Secondary | ICD-10-CM | POA: Insufficient documentation

## 2022-07-08 DIAGNOSIS — Z79899 Other long term (current) drug therapy: Secondary | ICD-10-CM | POA: Insufficient documentation

## 2022-07-08 DIAGNOSIS — E876 Hypokalemia: Secondary | ICD-10-CM | POA: Insufficient documentation

## 2022-07-08 DIAGNOSIS — C787 Secondary malignant neoplasm of liver and intrahepatic bile duct: Secondary | ICD-10-CM | POA: Diagnosis not present

## 2022-07-08 DIAGNOSIS — Z5111 Encounter for antineoplastic chemotherapy: Secondary | ICD-10-CM | POA: Diagnosis present

## 2022-07-08 DIAGNOSIS — Z801 Family history of malignant neoplasm of trachea, bronchus and lung: Secondary | ICD-10-CM | POA: Insufficient documentation

## 2022-07-08 DIAGNOSIS — I10 Essential (primary) hypertension: Secondary | ICD-10-CM | POA: Insufficient documentation

## 2022-07-08 DIAGNOSIS — Z5112 Encounter for antineoplastic immunotherapy: Secondary | ICD-10-CM | POA: Diagnosis present

## 2022-07-08 LAB — CBC WITH DIFFERENTIAL/PLATELET
Abs Immature Granulocytes: 0.01 10*3/uL (ref 0.00–0.07)
Basophils Absolute: 0 10*3/uL (ref 0.0–0.1)
Basophils Relative: 1 %
Eosinophils Absolute: 0.1 10*3/uL (ref 0.0–0.5)
Eosinophils Relative: 3 %
HCT: 40.4 % (ref 36.0–46.0)
Hemoglobin: 13.3 g/dL (ref 12.0–15.0)
Immature Granulocytes: 0 %
Lymphocytes Relative: 30 %
Lymphs Abs: 1.5 10*3/uL (ref 0.7–4.0)
MCH: 31.2 pg (ref 26.0–34.0)
MCHC: 32.9 g/dL (ref 30.0–36.0)
MCV: 94.8 fL (ref 80.0–100.0)
Monocytes Absolute: 0.5 10*3/uL (ref 0.1–1.0)
Monocytes Relative: 11 %
Neutro Abs: 2.8 10*3/uL (ref 1.7–7.7)
Neutrophils Relative %: 55 %
Platelets: 126 10*3/uL — ABNORMAL LOW (ref 150–400)
RBC: 4.26 MIL/uL (ref 3.87–5.11)
RDW: 17.3 % — ABNORMAL HIGH (ref 11.5–15.5)
WBC: 5 10*3/uL (ref 4.0–10.5)
nRBC: 0 % (ref 0.0–0.2)

## 2022-07-08 LAB — COMPREHENSIVE METABOLIC PANEL
ALT: 29 U/L (ref 0–44)
AST: 30 U/L (ref 15–41)
Albumin: 3.5 g/dL (ref 3.5–5.0)
Alkaline Phosphatase: 85 U/L (ref 38–126)
Anion gap: 11 (ref 5–15)
BUN: 16 mg/dL (ref 8–23)
CO2: 24 mmol/L (ref 22–32)
Calcium: 9.1 mg/dL (ref 8.9–10.3)
Chloride: 99 mmol/L (ref 98–111)
Creatinine, Ser: 0.96 mg/dL (ref 0.44–1.00)
GFR, Estimated: >60 mL/min (ref >60)
Glucose, Bld: 160 mg/dL — ABNORMAL HIGH (ref 70–99)
Potassium: 3.3 mmol/L — ABNORMAL LOW (ref 3.5–5.1)
Sodium: 134 mmol/L — ABNORMAL LOW (ref 135–145)
Total Bilirubin: 0.7 mg/dL (ref 0.3–1.2)
Total Protein: 6.9 g/dL (ref 6.5–8.1)

## 2022-07-08 LAB — MAGNESIUM: Magnesium: 1.7 mg/dL (ref 1.7–2.4)

## 2022-07-08 MED ORDER — SODIUM CHLORIDE 0.9 % IV SOLN
3250.0000 mg | INTRAVENOUS | Status: DC
  Administered 2022-07-08: 3250 mg via INTRAVENOUS
  Filled 2022-07-08: qty 65

## 2022-07-08 MED ORDER — SODIUM CHLORIDE 0.9 % IV SOLN: Freq: Once | INTRAVENOUS | Status: AC

## 2022-07-08 MED ORDER — SODIUM CHLORIDE 0.9 % IV SOLN
5.0000 mg/kg | Freq: Once | INTRAVENOUS | Status: AC
  Administered 2022-07-08: 400 mg via INTRAVENOUS
  Filled 2022-07-08: qty 16

## 2022-07-08 MED ORDER — PALONOSETRON HCL INJECTION 0.25 MG/5ML
0.2500 mg | Freq: Once | INTRAVENOUS | Status: AC
  Administered 2022-07-08: 0.25 mg via INTRAVENOUS
  Filled 2022-07-08: qty 5

## 2022-07-08 MED ORDER — SODIUM CHLORIDE 0.9% FLUSH: 10.0000 mL | INTRAVENOUS | Status: DC | PRN

## 2022-07-08 MED ORDER — POTASSIUM CHLORIDE CRYS ER 20 MEQ PO TBCR
40.0000 meq | EXTENDED_RELEASE_TABLET | Freq: Once | ORAL | Status: AC
  Administered 2022-07-08: 40 meq via ORAL
  Filled 2022-07-08: qty 2

## 2022-07-08 MED ORDER — SODIUM CHLORIDE 0.9 % IV SOLN
10.0000 mg | Freq: Once | INTRAVENOUS | Status: AC
  Administered 2022-07-08: 10 mg via INTRAVENOUS
  Filled 2022-07-08: qty 10

## 2022-07-08 MED ORDER — FLUOROURACIL CHEMO INJECTION 2.5 GM/50ML
270.0000 mg/m2 | Freq: Once | INTRAVENOUS | Status: AC
  Administered 2022-07-08: 550 mg via INTRAVENOUS
  Filled 2022-07-08: qty 11

## 2022-07-08 MED ORDER — SODIUM CHLORIDE 0.9 % IV SOLN
320.0000 mg/m2 | Freq: Once | INTRAVENOUS | Status: AC
  Administered 2022-07-08: 628 mg via INTRAVENOUS
  Filled 2022-07-08: qty 31.4

## 2022-07-08 MED ORDER — HEPARIN SOD (PORK) LOCK FLUSH 100 UNIT/ML IV SOLN: 500.0000 [IU] | Freq: Once | INTRAVENOUS | Status: DC | PRN

## 2022-07-08 NOTE — Patient Instructions (Signed)
[facility]  Discharge Instructions: Thank you for choosing [facility] to provide your oncology and hematology care.  If you have a lab appointment with the [facility], please come in thru the Main Entrance and check in at the main information desk.  Wear comfortable clothing and clothing appropriate for easy access to any Portacath or PICC line.   We strive to give you quality time with your provider. You may need to reschedule your appointment if you arrive late (15 or more minutes).  Arriving late affects you and other patients whose appointments are after yours.  Also, if you miss three or more appointments without notifying the office, you may be dismissed from the clinic at the provider's discretion.      For prescription refill requests, have your pharmacy contact our office and allow 72 hours for refills to be completed.    Today you received the following chemotherapy and/or immunotherapy agents Bevacizumab/leucovorin/fluorouracil with 59f pump start      To help prevent nausea and vomiting after your treatment, we encourage you to take your nausea medication as directed.  BELOW ARE SYMPTOMS THAT SHOULD BE REPORTED IMMEDIATELY: *FEVER GREATER THAN 100.4 F (38 C) OR HIGHER *CHILLS OR SWEATING *NAUSEA AND VOMITING THAT IS NOT CONTROLLED WITH YOUR NAUSEA MEDICATION *UNUSUAL SHORTNESS OF BREATH *UNUSUAL BRUISING OR BLEEDING *URINARY PROBLEMS (pain or burning when urinating, or frequent urination) *BOWEL PROBLEMS (unusual diarrhea, constipation, pain near the anus) TENDERNESS IN MOUTH AND THROAT WITH OR WITHOUT PRESENCE OF ULCERS (sore throat, sores in mouth, or a toothache) UNUSUAL RASH, SWELLING OR PAIN  UNUSUAL VAGINAL DISCHARGE OR ITCHING   Items with * indicate a potential emergency and should be followed up as soon as possible or go to the Emergency Department if any problems should occur.  Please show the CHEMOTHERAPY ALERT CARD or  IMMUNOTHERAPY ALERT CARD at check-in to the Emergency Department and triage nurse.  Should you have questions after your visit or need to cancel or reschedule your appointment, please contact [phone] and follow the prompts.  Office hours are 8:00 a.m. to 4:30 p.m. Monday - Friday. Please note that voicemails left after 4:00 p.m. may not be returned until the following business day.  We are closed weekends and major holidays. You have access to a nurse at all times for urgent questions. Please call the main number to the clinic [phone] follow the prompts.  For any non-urgent questions, you may also contact your provider using MyChart. We now offer e-Visits for anyone 180and older to request care online for non-urgent symptoms. For details visit [email or web address]   Also download the MyChart app! Go to the app store, search "MyChart", open the app, select [facility], and log in with your MyChart username and password.

## 2022-07-08 NOTE — Progress Notes (Signed)
Patient presents today for Bevacizumab/Leucovorin/Fluorouracil with 5FU pump start per providers order.  Vital signs within parameters for treatment.  Labs pending.  Patient has no new complaints at this time.  Labs within parameters for treatment.  Patient will also receive 40 mEq of PO potassium with treatment per provider.  Treatment given today per MD orders.  Stable during infusion without adverse affects.  Vital signs stable.  5FU pump connected and verified RUN on the screen with patient.  No complaints at this time.  Discharge from clinic ambulatory in stable condition.  Alert and oriented X 3.  Follow up with [facility] as scheduled.

## 2022-07-10 ENCOUNTER — Inpatient Hospital Stay: Payer: Medicaid Other

## 2022-07-10 VITALS — BP 153/84 | HR 80 | Temp 98.6°F | Resp 18

## 2022-07-10 DIAGNOSIS — Z5112 Encounter for antineoplastic immunotherapy: Secondary | ICD-10-CM | POA: Diagnosis not present

## 2022-07-10 DIAGNOSIS — C787 Secondary malignant neoplasm of liver and intrahepatic bile duct: Secondary | ICD-10-CM

## 2022-07-10 MED ORDER — HEPARIN SOD (PORK) LOCK FLUSH 100 UNIT/ML IV SOLN
500.0000 [IU] | Freq: Once | INTRAVENOUS | Status: AC | PRN
  Administered 2022-07-10: 500 [IU]

## 2022-07-10 MED ORDER — SODIUM CHLORIDE 0.9% FLUSH
10.0000 mL | INTRAVENOUS | Status: DC | PRN
  Administered 2022-07-10: 10 mL

## 2022-07-10 NOTE — Patient Instructions (Signed)
[facility]  Discharge Instructions: Thank you for choosing [facility] to provide your oncology and hematology care.  If you have a lab appointment with the Cancer Center, please come in thru the Main Entrance and check in at the main information desk.  Wear comfortable clothing and clothing appropriate for easy access to any Portacath or PICC line.   We strive to give you quality time with your provider. You may need to reschedule your appointment if you arrive late (15 or more minutes).  Arriving late affects you and other patients whose appointments are after yours.  Also, if you miss three or more appointments without notifying the office, you may be dismissed from the clinic at the provider's discretion.      For prescription refill requests, have your pharmacy contact our office and allow 72 hours for refills to be completed.    To help prevent nausea and vomiting after your treatment, we encourage you to take your nausea medication as directed.  BELOW ARE SYMPTOMS THAT SHOULD BE REPORTED IMMEDIATELY: *FEVER GREATER THAN 100.4 F (38 C) OR HIGHER *CHILLS OR SWEATING *NAUSEA AND VOMITING THAT IS NOT CONTROLLED WITH YOUR NAUSEA MEDICATION *UNUSUAL SHORTNESS OF BREATH *UNUSUAL BRUISING OR BLEEDING *URINARY PROBLEMS (pain or burning when urinating, or frequent urination) *BOWEL PROBLEMS (unusual diarrhea, constipation, pain near the anus) TENDERNESS IN MOUTH AND THROAT WITH OR WITHOUT PRESENCE OF ULCERS (sore throat, sores in mouth, or a toothache) UNUSUAL RASH, SWELLING OR PAIN  UNUSUAL VAGINAL DISCHARGE OR ITCHING   Items with * indicate a potential emergency and should be followed up as soon as possible or go to the Emergency Department if any problems should occur.  Please show the CHEMOTHERAPY ALERT CARD or IMMUNOTHERAPY ALERT CARD at check-in to the Emergency Department and triage nurse.  Should you have questions after your visit or need to  cancel or reschedule your appointment, please contact [facility] [phone]  and follow the prompts.  Office hours are 8:00 a.m. to 4:30 p.m. Monday - Friday. Please note that voicemails left after 4:00 p.m. may not be returned until the following business day.  We are closed weekends and major holidays. You have access to a nurse at all times for urgent questions. Please call the main number to the clinic [phone] and follow the prompts.  For any non-urgent questions, you may also contact your provider using MyChart. We now offer e-Visits for anyone 18 and older to request care online for non-urgent symptoms. For details visit [email or web address].   Also download the MyChart app! Go to the app store, search "MyChart", open the app, select [place], and log in with your MyChart username and password.   

## 2022-07-10 NOTE — Progress Notes (Signed)
Patients port flushed without difficulty.  Home infusion 5FU pump disconnected.  Good blood return noted with no bruising or swelling noted at site.  Band aid applied.  VSS with discharge and left in satisfactory condition with no s/s of distress noted.

## 2022-07-13 ENCOUNTER — Other Ambulatory Visit (HOSPITAL_COMMUNITY): Payer: Self-pay | Admitting: Hematology

## 2022-07-13 DIAGNOSIS — C189 Malignant neoplasm of colon, unspecified: Secondary | ICD-10-CM

## 2022-07-13 DIAGNOSIS — E876 Hypokalemia: Secondary | ICD-10-CM

## 2022-07-22 ENCOUNTER — Inpatient Hospital Stay (HOSPITAL_BASED_OUTPATIENT_CLINIC_OR_DEPARTMENT_OTHER): Payer: Medicaid Other | Admitting: Hematology

## 2022-07-22 ENCOUNTER — Inpatient Hospital Stay: Payer: Medicaid Other

## 2022-07-22 VITALS — BP 145/79 | HR 82 | Temp 97.7°F | Resp 16 | Ht 63.0 in | Wt 189.0 lb

## 2022-07-22 DIAGNOSIS — C189 Malignant neoplasm of colon, unspecified: Secondary | ICD-10-CM

## 2022-07-22 DIAGNOSIS — Z5112 Encounter for antineoplastic immunotherapy: Secondary | ICD-10-CM | POA: Diagnosis not present

## 2022-07-22 DIAGNOSIS — C787 Secondary malignant neoplasm of liver and intrahepatic bile duct: Secondary | ICD-10-CM

## 2022-07-22 LAB — CBC WITH DIFFERENTIAL/PLATELET
Abs Immature Granulocytes: 0.02 10*3/uL (ref 0.00–0.07)
Basophils Absolute: 0 10*3/uL (ref 0.0–0.1)
Basophils Relative: 1 %
Eosinophils Absolute: 0.2 10*3/uL (ref 0.0–0.5)
Eosinophils Relative: 3 %
HCT: 39.6 % (ref 36.0–46.0)
Hemoglobin: 13.3 g/dL (ref 12.0–15.0)
Immature Granulocytes: 0 %
Lymphocytes Relative: 33 %
Lymphs Abs: 2.1 10*3/uL (ref 0.7–4.0)
MCH: 31.4 pg (ref 26.0–34.0)
MCHC: 33.6 g/dL (ref 30.0–36.0)
MCV: 93.6 fL (ref 80.0–100.0)
Monocytes Absolute: 0.8 10*3/uL (ref 0.1–1.0)
Monocytes Relative: 12 %
Neutro Abs: 3.3 10*3/uL (ref 1.7–7.7)
Neutrophils Relative %: 51 %
Platelets: 129 10*3/uL — ABNORMAL LOW (ref 150–400)
RBC: 4.23 MIL/uL (ref 3.87–5.11)
RDW: 17.5 % — ABNORMAL HIGH (ref 11.5–15.5)
WBC: 6.4 10*3/uL (ref 4.0–10.5)
nRBC: 0 % (ref 0.0–0.2)

## 2022-07-22 LAB — COMPREHENSIVE METABOLIC PANEL
ALT: 32 U/L (ref 0–44)
AST: 39 U/L (ref 15–41)
Albumin: 3.6 g/dL (ref 3.5–5.0)
Alkaline Phosphatase: 83 U/L (ref 38–126)
Anion gap: 8 (ref 5–15)
BUN: 11 mg/dL (ref 8–23)
CO2: 26 mmol/L (ref 22–32)
Calcium: 8.8 mg/dL — ABNORMAL LOW (ref 8.9–10.3)
Chloride: 98 mmol/L (ref 98–111)
Creatinine, Ser: 0.99 mg/dL (ref 0.44–1.00)
GFR, Estimated: >60 mL/min (ref >60)
Glucose, Bld: 122 mg/dL — ABNORMAL HIGH (ref 70–99)
Potassium: 3.4 mmol/L — ABNORMAL LOW (ref 3.5–5.1)
Sodium: 132 mmol/L — ABNORMAL LOW (ref 135–145)
Total Bilirubin: 0.8 mg/dL (ref 0.3–1.2)
Total Protein: 7.3 g/dL (ref 6.5–8.1)

## 2022-07-22 LAB — MAGNESIUM: Magnesium: 2 mg/dL (ref 1.7–2.4)

## 2022-07-22 MED ORDER — HEPARIN SOD (PORK) LOCK FLUSH 100 UNIT/ML IV SOLN: 500.0000 [IU] | Freq: Once | INTRAVENOUS | Status: DC | PRN

## 2022-07-22 MED ORDER — SODIUM CHLORIDE 0.9 % IV SOLN
10.0000 mg | Freq: Once | INTRAVENOUS | Status: AC
  Administered 2022-07-22: 10 mg via INTRAVENOUS
  Filled 2022-07-22: qty 10

## 2022-07-22 MED ORDER — SODIUM CHLORIDE 0.9% FLUSH: 10.0000 mL | INTRAVENOUS | Status: DC | PRN

## 2022-07-22 MED ORDER — SODIUM CHLORIDE 0.9 % IV SOLN
3250.0000 mg | INTRAVENOUS | Status: DC
  Administered 2022-07-22: 3250 mg via INTRAVENOUS
  Filled 2022-07-22: qty 65

## 2022-07-22 MED ORDER — PALONOSETRON HCL INJECTION 0.25 MG/5ML
0.2500 mg | Freq: Once | INTRAVENOUS | Status: AC
  Administered 2022-07-22: 0.25 mg via INTRAVENOUS
  Filled 2022-07-22: qty 5

## 2022-07-22 MED ORDER — SODIUM CHLORIDE 0.9 % IV SOLN
320.0000 mg/m2 | Freq: Once | INTRAVENOUS | Status: AC
  Administered 2022-07-22: 628 mg via INTRAVENOUS
  Filled 2022-07-22: qty 31.4

## 2022-07-22 MED ORDER — FLUOROURACIL CHEMO INJECTION 2.5 GM/50ML
270.0000 mg/m2 | Freq: Once | INTRAVENOUS | Status: AC
  Administered 2022-07-22: 550 mg via INTRAVENOUS
  Filled 2022-07-22: qty 11

## 2022-07-22 MED ORDER — SODIUM CHLORIDE 0.9% FLUSH
10.0000 mL | INTRAVENOUS | Status: AC
  Administered 2022-07-22: 10 mL

## 2022-07-22 MED ORDER — SODIUM CHLORIDE 0.9 % IV SOLN
5.0000 mg/kg | Freq: Once | INTRAVENOUS | Status: AC
  Administered 2022-07-22: 400 mg via INTRAVENOUS
  Filled 2022-07-22: qty 16

## 2022-07-22 MED ORDER — SODIUM CHLORIDE 0.9 % IV SOLN: Freq: Once | INTRAVENOUS | Status: AC

## 2022-07-22 NOTE — Patient Instructions (Signed)
[facility]  Discharge Instructions: Thank you for choosing [facility] to provide your oncology and hematology care.  If you have a lab appointment with the [facility], please come in thru the Main Entrance and check in at the main information desk.  Wear comfortable clothing and clothing appropriate for easy access to any Portacath or PICC line.   We strive to give you quality time with your provider. You may need to reschedule your appointment if you arrive late (15 or more minutes).  Arriving late affects you and other patients whose appointments are after yours.  Also, if you miss three or more appointments without notifying the office, you may be dismissed from the clinic at the provider's discretion.      For prescription refill requests, have your pharmacy contact our office and allow 72 hours for refills to be completed.    Today you received the following chemotherapy and/or immunotherapy agentVegzelma Leucovorin 5FU pump start      To help prevent nausea and vomiting after your treatment, we encourage you to take your nausea medication as directed.  BELOW ARE SYMPTOMS THAT SHOULD BE REPORTED IMMEDIATELY: *FEVER GREATER THAN 100.4 F (38 C) OR HIGHER *CHILLS OR SWEATING *NAUSEA AND VOMITING THAT IS NOT CONTROLLED WITH YOUR NAUSEA MEDICATION *UNUSUAL SHORTNESS OF BREATH *UNUSUAL BRUISING OR BLEEDING *URINARY PROBLEMS (pain or burning when urinating, or frequent urination) *BOWEL PROBLEMS (unusual diarrhea, constipation, pain near the anus) TENDERNESS IN MOUTH AND THROAT WITH OR WITHOUT PRESENCE OF ULCERS (sore throat, sores in mouth, or a toothache) UNUSUAL RASH, SWELLING OR PAIN  UNUSUAL VAGINAL DISCHARGE OR ITCHING   Items with * indicate a potential emergency and should be followed up as soon as possible or go to the Emergency Department if any problems should occur.  Please show the CHEMOTHERAPY ALERT CARD or IMMUNOTHERAPY ALERT CARD at  check-in to the Emergency Department and triage nurse.  Should you have questions after your visit or need to cancel or reschedule your appointment, please contact [facility] [phone]  and follow the prompts.  Office hours are 8:00 a.m. to 4:30 p.m. Monday - Friday. Please note that voicemails left after 4:00 p.m. may not be returned until the following business day.  We are closed weekends and major holidays. You have access to a nurse at all times for urgent questions. Please call the main number to the clinic [phone] and follow the prompts.  For any non-urgent questions, you may also contact your provider using MyChart. We now offer e-Visits for anyone 52 and older to request care online for non-urgent symptoms. For details visit [email or web address].   Also download the MyChart app! Go to the app store, search "MyChart", open the app, select [place], and log in with your MyChart username and password.

## 2022-07-22 NOTE — Patient Instructions (Addendum)
[place] at [facility] Discharge Instructions   You were seen and examined today by Dr. Delton Coombes.  He reviewed the results of your lab work which are normal/stable. Your cancer tumor marker results from today are pending.   We will proceed with your treatment today.   Return as scheduled.    Thank you for choosing [place] at [facility] to provide your oncology and hematology care.  To afford each patient quality time with our provider, please arrive at least 15 minutes before your scheduled appointment time.   If you have a lab appointment with the [facility] please come in thru the Main Entrance and check in at the main information desk.  You need to re-schedule your appointment should you arrive 10 or more minutes late.  We strive to give you quality time with our providers, and arriving late affects you and other patients whose appointments are after yours.  Also, if you no show three or more times for appointments you may be dismissed from the clinic at the providers discretion.     Again, thank you for choosing [facility].  Our hope is that these requests will decrease the amount of time that you wait before being seen by our physicians.       _____________________________________________________________  Should you have questions after your visit to [facility], please contact our office at [phone] and follow the prompts.  Our office hours are 8:00 a.m. and 4:30 p.m. Monday - Friday.  Please note that voicemails left after 4:00 p.m. may not be returned until the following business day.  We are closed weekends and major holidays.  You do have access to a nurse 24-7, just call the main number to the clinic [phone] and do not press any options, hold on the line and a nurse will answer the phone.    For prescription refill requests, have your pharmacy contact our office and allow 72 hours.     Due to Covid, you will need to wear a mask upon entering the hospital. If you do not have a mask, a mask will be given to you at the Main Entrance upon arrival. For doctor visits, patients may have 1 support person age 3 or older with them. For treatment visits, patients can not have anyone with them due to social distancing guidelines and our immunocompromised population.

## 2022-07-22 NOTE — Progress Notes (Signed)
[address] [place], [place] [address]   CLINIC:  Medical Oncology/Hematology  PCP:  Patient, No Pcp Per None None   REASON FOR VISIT:  Follow-up for metastatic colon cancer to liver  PRIOR THERAPY: Sigmoid colectomy and end colostomy on 04/29/2020  NGS Results: Foundation 1 KRAS wildtype, MS--stable, TMB 4 Muts/Mb  CURRENT THERAPY: Maintenance 5-FU and bevacizumab  BRIEF ONCOLOGIC HISTORY:  Oncology History  Metastatic colon cancer to liver ([place])  05/16/2020 Initial Diagnosis   Metastatic colon cancer to liver ([place])   06/03/2020 Genetic Testing   Foundation One:     06/04/2020 - 02/25/2022 Chemotherapy   Patient is on Treatment Plan : COLORECTAL FOLFOX + Bevacizumab q14d     06/04/2020 -  Chemotherapy   Patient is on Treatment Plan : COLORECTAL FOLFOX + Bevacizumab q14d       CANCER STAGING:  Cancer Staging  No matching staging information was found for the patient.  INTERVAL HISTORY:  [name], a 62 y.o. female, is seen for follow-up of metastatic colon cancer.  Reports some shortness of breath with exertion.  Occasional diarrhea is also stable.  Energy levels are 65%.  Numbness in the hands has been stable.  No new onset pains.  She had diarrhea last week for 3 days and had to empty her bag twice daily.  REVIEW OF SYSTEMS:  Review of Systems  Constitutional:  Negative for appetite change and fatigue.  HENT:   Negative for nosebleeds.   Respiratory:  Positive for shortness of breath (exertion).   Gastrointestinal:  Positive for diarrhea. Negative for abdominal pain.  Neurological:  Positive for numbness (Right hand and lips).  Hematological:  Does not bruise/bleed easily.  All other systems reviewed and are negative.   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 Delton Coombes   04-29-2020 [facility] surgery for perforated colon  s/p sigmoid colectomy w/ colostomy; dx  Stage IV colon cancer mets to liver   Anxiety    Depression    Hypertension    followed by dr Glo Herring and oncology  (05-27-2020 per pt does not have pcp yet)   IDA (iron deficiency anemia)    Pulmonary nodule, right    Past Surgical History:  Procedure Laterality Date   ABDOMINAL HYSTERECTOMY  03-31-2016   '@[facility]'$    W/  BILATERAL SALPINOOPHORECTOMY    APPLICATION OF WOUND VAC N/A 04/29/2020   Procedure: APPLICATION OF WOUND VAC;  Surgeon: Mickeal Skinner, MD;  Location: [facility];  Service: General;  Laterality: N/A;   ENDOMETRIAL ABLATION     LAPAROTOMY N/A 04/29/2020   Procedure: EXPLORATORY LAPAROTOMY;  Surgeon: Mickeal Skinner, MD;  Location: [facility];  Service: General;  Laterality: N/A;   PARTIAL COLECTOMY N/A 04/29/2020   Procedure: PARTIAL COLECTOMY WITH END COLOSTOMY;  Surgeon: Kieth Brightly Arta Bruce, MD;  Location: [facility];  Service: General;  Laterality: N/A;   PORTACATH PLACEMENT Right 05/28/2020   Procedure: INSERTION PORT-A-CATH;  Surgeon: Kinsinger, Arta Bruce, MD;  Location[facility];  Service: General;  Laterality: Right;   SALPINGOOPHORECTOMY Left 03/31/2016   Procedure: LEFT SALPINGO OOPHORECTOMY WITH FROZEN SECTION,  ABDOMINAL HYSTERECTOMY WITH BILATERAL SALPINGO-OOPHORECTOMY;  Surgeon: Jonnie Kind, MD;  Location: [facility];  Service: Gynecology;  Laterality: Left;    SOCIAL HISTORY:  Social History   Socioeconomic History   Marital status: Divorced    Spouse name: Not  on file   Number of children: Not on file   Years of education: Not on file   Highest education level: Not on file  Occupational History   Not on file  Tobacco Use   Smoking status: Former    Packs/day: 0.50    Years: 5.00    Total pack years: 2.50    Types: Cigarettes    Quit date: 03/28/2015    Years since quitting: 7.3   Smokeless tobacco: Never  Vaping Use   Vaping Use: Never used  Substance and Sexual Activity   Alcohol use: No    Drug use: Never   Sexual activity: Yes    Partners: Male    Birth control/protection: Surgical  Other Topics Concern   Not on file  Social History Narrative   Not on file   Social Determinants of Health   Financial Resource Strain: Low Risk  (11/01/2019)   Overall Financial Resource Strain (CARDIA)    Difficulty of Paying Living Expenses: Not very hard  Food Insecurity: No Food Insecurity (11/01/2019)   Hunger Vital Sign    Worried About Running Out of Food in the Last Year: Never true    Ran Out of Food in the Last Year: Never true  Transportation Needs: No Transportation Needs (11/01/2019)   PRAPARE - Hydrologist (Medical): No    Lack of Transportation (Non-Medical): No  Physical Activity: Insufficiently Active (11/01/2019)   Exercise Vital Sign    Days of Exercise per Week: 2 days    Minutes of Exercise per Session: 30 min  Stress: Stress Concern Present (11/01/2019)   [place]    Feeling of Stress : To some extent  Social Connections: Moderately Integrated (11/01/2019)   Social Connection and Isolation Panel [NHANES]    Frequency of Communication with Friends and Family: More than three times a week    Frequency of Social Gatherings with Friends and Family: Once a week    Attends Religious Services: More than 4 times per year    Active Member of [employer] or Organizations: No    Attends Archivist Meetings: Never    Marital Status: Living with partner  Intimate Partner Violence: Not At Risk (11/01/2019)   Humiliation, Afraid, Rape, and Kick questionnaire    Fear of Current or Ex-Partner: No    Emotionally Abused: No    Physically Abused: No    Sexually Abused: No    FAMILY HISTORY:  Family History  Problem Relation Age of Onset   Hypertension Mother    Diabetes Mother    Cirrhosis Father     CURRENT MEDICATIONS:  Current Outpatient Medications  Medication Sig Dispense  Refill   amLODipine (NORVASC) 5 MG tablet TAKE 1 TABLET (5 MG TOTAL) BY MOUTH DAILY. 90 tablet 1   BEVACIZUMAB IV Inject 5 mg/kg into the vein every 14 (fourteen) days.     clonazePAM (KLONOPIN) 1 MG tablet Take 1 tablet (1 mg total) by mouth in the morning, at noon, and at bedtime. 90 tablet 3   fluorouracil CALGB 35573 in sodium chloride 0.9 % 150 mL Inject 2,400 mg/m2 into the vein over 48 hr.     gabapentin (NEURONTIN) 100 MG capsule TAKE 1 CAPSULE (100 MG TOTAL) BY MOUTH THREE TIMES DAILY. 90 capsule 3   KLOR-CON M10 10 MEQ tablet TAKE 2 TABLETS BY MOUTH DAILY 60 tablet 6   LEUCOVORIN CALCIUM IV Inject 400 mg/m2 into  the vein every 21 ( twenty-one) days.     lidocaine (XYLOCAINE) 2 % solution Use as directed 15 mLs in the mouth or throat every 6 (six) hours as needed for mouth pain. Swish and spit/swallow every six hours as needed for mouth pain 480 mL 3   nystatin (MYCOSTATIN) 100000 UNIT/ML suspension Take by mouth.     PARoxetine (PAXIL) 40 MG tablet Take 1 tablet (40 mg total) by mouth every morning. 90 tablet 3   polyethylene glycol (MIRALAX / GLYCOLAX) packet Take 17 g by mouth every other day.     triamterene-hydrochlorothiazide (MAXZIDE-25) 37.5-25 MG tablet TAKE 1 TABLET BY MOUTH EVERY DAY 90 tablet 1   No current facility-administered medications for this visit.    ALLERGIES:  No Known Allergies  PHYSICAL EXAM:  Performance status (ECOG): 1 - Symptomatic but completely ambulatory  There were no vitals filed for this visit.  Wt Readings from Last 3 Encounters:  07/08/22 188 lb 4.8 oz (85.4 kg)  06/24/22 172 lb 6.4 oz (78.2 kg)  06/10/22 188 lb 3.2 oz (85.4 kg)   Physical Exam Vitals reviewed.  Constitutional:      Appearance: Normal appearance.  Cardiovascular:     Rate and Rhythm: Normal rate and regular rhythm.     Pulses: Normal pulses.     Heart sounds: Normal heart sounds.  Pulmonary:     Effort: Pulmonary effort is normal.     Breath sounds: Normal breath  sounds.  Abdominal:     Palpations: Abdomen is soft. There is no mass.     Tenderness: There is no abdominal tenderness.  Musculoskeletal:     Right lower leg: No edema.     Left lower leg: No edema.  Neurological:     General: No focal deficit present.     Mental Status: She is alert and oriented to person, place, and time.  Psychiatric:        Mood and Affect: Mood normal.        Behavior: Behavior normal.     LABORATORY DATA:  I have reviewed the labs as listed.     Latest Ref Rng & Units 07/22/2022    8:31 AM 07/08/2022    9:11 AM 06/24/2022    9:11 AM  CBC  WBC 4.0 - 10.5 K/uL 6.4  5.0  6.1   Hemoglobin 12.0 - 15.0 g/dL 13.3  13.3  13.5   Hematocrit 36.0 - 46.0 % 39.6  40.4  39.7   Platelets 150 - 400 K/uL 129  126  124       Latest Ref Rng & Units 07/22/2022    8:31 AM 07/08/2022    9:11 AM 06/24/2022    9:11 AM  CMP  Glucose 70 - 99 mg/dL 122  160  129   BUN 8 - 23 mg/dL '11  16  11   '$ Creatinine 0.44 - 1.00 mg/dL 0.99  0.96  0.87   Sodium 135 - 145 mmol/L 132  134  135   Potassium 3.5 - 5.1 mmol/L 3.4  3.3  3.5   Chloride 98 - 111 mmol/L 98  99  100   CO2 22 - 32 mmol/L '26  24  27   '$ Calcium 8.9 - 10.3 mg/dL 8.8  9.1  9.1   Total Protein 6.5 - 8.1 g/dL 7.3  6.9  7.0   Total Bilirubin 0.3 - 1.2 mg/dL 0.8  0.7  0.6   Alkaline Phos 38 - 126 U/L 83  85  82   AST 15 - 41 U/L 39  30  42   ALT 0 - 44 U/L 32  29  40     DIAGNOSTIC IMAGING:  I have independently reviewed the scans and discussed with the patient. No results found.   ASSESSMENT:  1.  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 -CT chest on 05/02/2020 with no evidence of pulmonary metastatic disease. -CTAP on 05/07/2020 with multiple lesions throughout the liver, largest in the right  lobe measuring 3.9 cm, lateral segment left lobe measuring 2.6 cm and inferior right lobe confluent lesion 4.2 x 3.7 cm.  No lymphadenopathy. -CEA on 05/02/2020-60.2. -FOLFOX and bevacizumab started on 06/04/2020. 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   2.  Iron deficiency anemia: -Feraheme on 04/30/2020 and 05/06/2020.   3.  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 -Smoked for 3 to 4 years and quit. -Mother had cancer, type unknown.  Maternal uncle had lung cancer.  Maternal aunt had lung cancer.   PLAN:  1.  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 - Last CEA was 3.2 on 06/24/2022. - Reviewed labs today which showed normal LFTs.  CBC with mild thrombocytopenia is also stable.  UA on 1227 was negative for protein. - Recommend continue maintenance 5-FU and bevacizumab every 2 weeks.  RTC 4 weeks.  I plan to repeat CT CAP and CEA levels.   2.  Hypertension: - Continue triamterene/HCTZ and Norvasc 5 mg daily.  Blood pressure is 142/79.   3.  Severe hypokalemia: - Continue potassium 10 mEq 3 tablets daily.  Potassium is 3.4.   4.  Peripheral neuropathy: - Continue gabapentin 100 mg 3 times daily.  Well-controlled.   5.  Anxiety: - Continue Klonopin 1 mg 3 times daily.  Continue Paxil 40 mg daily.  6.  Mucositis: - She has soreness in the mouth for 2 days after each treatment.  Continue Vaseline and lidocaine gel as needed.   Orders placed this encounter:  No orders of the defined types were placed in this encounter.     Derek Jack, MD [place] [phone]

## 2022-07-22 NOTE — Progress Notes (Signed)
Patient has been examined by Dr. Katragadda, and vital signs and labs have been reviewed. ANC, Creatinine, LFTs, hemoglobin, and platelets are within treatment parameters per M.D. - pt may proceed with treatment.  Primary RN and pharmacy notified.  

## 2022-07-22 NOTE — Progress Notes (Signed)
Patient presents today for Vegzelma/Leucovorin/Fluororuracil with 5FU pump start per providers order.  Vital signs within parameters for treatment.  Labs pending.    Labs within parameters for treatment.  Message received from Anastasio Champion RN/Dr. Delton Coombes patient okay for treatment.  Treatment given today per MD orders.  Stable during infusion without adverse affects.  Vital signs stable.  5FU pump connected and Verified RUN on the screen with the patient.  No complaints at this time.  Discharge from clinic ambulatory in stable condition.  Alert and oriented X 3.  Follow up with [facility] as scheduled.

## 2022-07-23 ENCOUNTER — Other Ambulatory Visit: Payer: Self-pay

## 2022-07-24 ENCOUNTER — Inpatient Hospital Stay: Payer: Medicaid Other

## 2022-07-24 VITALS — BP 151/92 | HR 71 | Temp 99.0°F | Resp 18

## 2022-07-24 DIAGNOSIS — Z5112 Encounter for antineoplastic immunotherapy: Secondary | ICD-10-CM | POA: Diagnosis not present

## 2022-07-24 DIAGNOSIS — C189 Malignant neoplasm of colon, unspecified: Secondary | ICD-10-CM

## 2022-07-24 LAB — CEA: CEA: 3.7 ng/mL (ref 0.0–4.7)

## 2022-07-24 MED ORDER — SODIUM CHLORIDE 0.9% FLUSH
10.0000 mL | INTRAVENOUS | Status: DC | PRN
  Administered 2022-07-24: 10 mL

## 2022-07-24 MED ORDER — HEPARIN SOD (PORK) LOCK FLUSH 100 UNIT/ML IV SOLN
500.0000 [IU] | Freq: Once | INTRAVENOUS | Status: AC | PRN
  Administered 2022-07-24: 500 [IU]

## 2022-07-24 NOTE — Patient Instructions (Signed)
[facility]  Discharge Instructions: Thank you for choosing [facility] to provide your oncology and hematology care.  If you have a lab appointment with the Cancer Center, please come in thru the Main Entrance and check in at the main information desk.  Wear comfortable clothing and clothing appropriate for easy access to any Portacath or PICC line.   We strive to give you quality time with your provider. You may need to reschedule your appointment if you arrive late (15 or more minutes).  Arriving late affects you and other patients whose appointments are after yours.  Also, if you miss three or more appointments without notifying the office, you may be dismissed from the clinic at the provider's discretion.      For prescription refill requests, have your pharmacy contact our office and allow 72 hours for refills to be completed.    Today you had your ambulatory pump disconnected.     To help prevent nausea and vomiting after your treatment, we encourage you to take your nausea medication as directed.  BELOW ARE SYMPTOMS THAT SHOULD BE REPORTED IMMEDIATELY: *FEVER GREATER THAN 100.4 F (38 C) OR HIGHER *CHILLS OR SWEATING *NAUSEA AND VOMITING THAT IS NOT CONTROLLED WITH YOUR NAUSEA MEDICATION *UNUSUAL SHORTNESS OF BREATH *UNUSUAL BRUISING OR BLEEDING *URINARY PROBLEMS (pain or burning when urinating, or frequent urination) *BOWEL PROBLEMS (unusual diarrhea, constipation, pain near the anus) TENDERNESS IN MOUTH AND THROAT WITH OR WITHOUT PRESENCE OF ULCERS (sore throat, sores in mouth, or a toothache) UNUSUAL RASH, SWELLING OR PAIN  UNUSUAL VAGINAL DISCHARGE OR ITCHING   Items with * indicate a potential emergency and should be followed up as soon as possible or go to the Emergency Department if any problems should occur.  Please show the CHEMOTHERAPY ALERT CARD or IMMUNOTHERAPY ALERT CARD at check-in to the Emergency Department and triage  nurse.  Should you have questions after your visit or need to cancel or reschedule your appointment, please contact [facility] [phone]  and follow the prompts.  Office hours are 8:00 a.m. to 4:30 p.m. Monday - Friday. Please note that voicemails left after 4:00 p.m. may not be returned until the following business day.  We are closed weekends and major holidays. You have access to a nurse at all times for urgent questions. Please call the main number to the clinic [phone] and follow the prompts.  For any non-urgent questions, you may also contact your provider using MyChart. We now offer e-Visits for anyone 18 and older to request care online for non-urgent symptoms. For details visit [email or web address].   Also download the MyChart app! Go to the app store, search "MyChart", open the app, select [place], and log in with your MyChart username and password.   

## 2022-07-24 NOTE — Progress Notes (Signed)
Fynn Adel presents to have home infusion pump d/c'd and for port-a-cath deaccess with flush.  Portacath located right chest wall accessed with  H 20 needle.  No blood return and no resistance met.  Portacath flushed with NS and 500U/54m Heparin, and needle removed intact.  Procedure tolerated well and without incident.

## 2022-08-04 ENCOUNTER — Other Ambulatory Visit: Payer: Self-pay

## 2022-08-05 ENCOUNTER — Inpatient Hospital Stay: Payer: Medicaid Other

## 2022-08-05 ENCOUNTER — Inpatient Hospital Stay: Payer: Medicaid Other | Attending: Hematology

## 2022-08-05 VITALS — BP 143/83 | HR 80 | Temp 98.2°F | Resp 20

## 2022-08-05 VITALS — BP 146/82 | HR 86 | Temp 99.0°F | Resp 20 | Wt 188.8 lb

## 2022-08-05 DIAGNOSIS — I10 Essential (primary) hypertension: Secondary | ICD-10-CM | POA: Diagnosis not present

## 2022-08-05 DIAGNOSIS — Z79899 Other long term (current) drug therapy: Secondary | ICD-10-CM | POA: Insufficient documentation

## 2022-08-05 DIAGNOSIS — C787 Secondary malignant neoplasm of liver and intrahepatic bile duct: Secondary | ICD-10-CM | POA: Diagnosis not present

## 2022-08-05 DIAGNOSIS — C187 Malignant neoplasm of sigmoid colon: Secondary | ICD-10-CM | POA: Diagnosis not present

## 2022-08-05 DIAGNOSIS — G629 Polyneuropathy, unspecified: Secondary | ICD-10-CM | POA: Insufficient documentation

## 2022-08-05 DIAGNOSIS — E876 Hypokalemia: Secondary | ICD-10-CM | POA: Insufficient documentation

## 2022-08-05 DIAGNOSIS — Z5112 Encounter for antineoplastic immunotherapy: Secondary | ICD-10-CM | POA: Diagnosis present

## 2022-08-05 DIAGNOSIS — F419 Anxiety disorder, unspecified: Secondary | ICD-10-CM | POA: Insufficient documentation

## 2022-08-05 DIAGNOSIS — K1230 Oral mucositis (ulcerative), unspecified: Secondary | ICD-10-CM | POA: Insufficient documentation

## 2022-08-05 DIAGNOSIS — Z87891 Personal history of nicotine dependence: Secondary | ICD-10-CM | POA: Diagnosis not present

## 2022-08-05 DIAGNOSIS — F17210 Nicotine dependence, cigarettes, uncomplicated: Secondary | ICD-10-CM | POA: Diagnosis not present

## 2022-08-05 DIAGNOSIS — D509 Iron deficiency anemia, unspecified: Secondary | ICD-10-CM | POA: Insufficient documentation

## 2022-08-05 DIAGNOSIS — Z801 Family history of malignant neoplasm of trachea, bronchus and lung: Secondary | ICD-10-CM | POA: Diagnosis not present

## 2022-08-05 DIAGNOSIS — Z95828 Presence of other vascular implants and grafts: Secondary | ICD-10-CM

## 2022-08-05 LAB — CBC WITH DIFFERENTIAL/PLATELET
Abs Immature Granulocytes: 0.01 10*3/uL (ref 0.00–0.07)
Basophils Absolute: 0 10*3/uL (ref 0.0–0.1)
Basophils Relative: 1 %
Eosinophils Absolute: 0 10*3/uL (ref 0.0–0.5)
Eosinophils Relative: 1 %
HCT: 40.3 % (ref 36.0–46.0)
Hemoglobin: 13.6 g/dL (ref 12.0–15.0)
Immature Granulocytes: 0 %
Lymphocytes Relative: 25 %
Lymphs Abs: 0.9 10*3/uL (ref 0.7–4.0)
MCH: 31.3 pg (ref 26.0–34.0)
MCHC: 33.7 g/dL (ref 30.0–36.0)
MCV: 92.6 fL (ref 80.0–100.0)
Monocytes Absolute: 0.5 10*3/uL (ref 0.1–1.0)
Monocytes Relative: 14 %
Neutro Abs: 2.2 10*3/uL (ref 1.7–7.7)
Neutrophils Relative %: 59 %
Platelets: 78 10*3/uL — ABNORMAL LOW (ref 150–400)
RBC: 4.35 MIL/uL (ref 3.87–5.11)
RDW: 17.8 % — ABNORMAL HIGH (ref 11.5–15.5)
WBC Morphology: REACTIVE
WBC: 3.7 10*3/uL — ABNORMAL LOW (ref 4.0–10.5)
nRBC: 0 % (ref 0.0–0.2)

## 2022-08-05 LAB — URINALYSIS, DIPSTICK ONLY
Bilirubin Urine: NEGATIVE
Glucose, UA: NEGATIVE mg/dL
Hgb urine dipstick: NEGATIVE
Ketones, ur: NEGATIVE mg/dL
Leukocytes,Ua: NEGATIVE
Nitrite: NEGATIVE
Protein, ur: 100 mg/dL — AB
Specific Gravity, Urine: 1.021 (ref 1.005–1.030)
pH: 5 (ref 5.0–8.0)

## 2022-08-05 LAB — COMPREHENSIVE METABOLIC PANEL
ALT: 43 U/L (ref 0–44)
AST: 55 U/L — ABNORMAL HIGH (ref 15–41)
Albumin: 3.7 g/dL (ref 3.5–5.0)
Alkaline Phosphatase: 99 U/L (ref 38–126)
Anion gap: 13 (ref 5–15)
BUN: 17 mg/dL (ref 8–23)
CO2: 21 mmol/L — ABNORMAL LOW (ref 22–32)
Calcium: 8.5 mg/dL — ABNORMAL LOW (ref 8.9–10.3)
Chloride: 98 mmol/L (ref 98–111)
Creatinine, Ser: 1.07 mg/dL — ABNORMAL HIGH (ref 0.44–1.00)
GFR, Estimated: 59 mL/min — ABNORMAL LOW (ref >60)
Glucose, Bld: 182 mg/dL — ABNORMAL HIGH (ref 70–99)
Potassium: 3.1 mmol/L — ABNORMAL LOW (ref 3.5–5.1)
Sodium: 132 mmol/L — ABNORMAL LOW (ref 135–145)
Total Bilirubin: 0.8 mg/dL (ref 0.3–1.2)
Total Protein: 7.4 g/dL (ref 6.5–8.1)

## 2022-08-05 LAB — MAGNESIUM: Magnesium: 1.8 mg/dL (ref 1.7–2.4)

## 2022-08-05 MED ORDER — ALTEPLASE 2 MG IJ SOLR
2.0000 mg | Freq: Once | INTRAMUSCULAR | Status: AC
  Administered 2022-08-05: 2 mg
  Filled 2022-08-05: qty 2

## 2022-08-05 MED ORDER — POTASSIUM CHLORIDE CRYS ER 20 MEQ PO TBCR
40.0000 meq | EXTENDED_RELEASE_TABLET | Freq: Once | ORAL | Status: AC
  Administered 2022-08-05: 40 meq via ORAL
  Filled 2022-08-05: qty 2

## 2022-08-05 MED ORDER — SODIUM CHLORIDE 0.9 % IV SOLN
3250.0000 mg | INTRAVENOUS | Status: DC
  Administered 2022-08-05: 3250 mg via INTRAVENOUS
  Filled 2022-08-05: qty 65

## 2022-08-05 MED ORDER — FLUOROURACIL CHEMO INJECTION 500 MG/10ML
270.0000 mg/m2 | Freq: Once | INTRAVENOUS | Status: AC
  Administered 2022-08-05: 500 mg via INTRAVENOUS
  Filled 2022-08-05: qty 10

## 2022-08-05 MED ORDER — SODIUM CHLORIDE 0.9 % IV SOLN
10.0000 mg | Freq: Once | INTRAVENOUS | Status: AC
  Administered 2022-08-05: 10 mg via INTRAVENOUS
  Filled 2022-08-05: qty 10

## 2022-08-05 MED ORDER — SODIUM CHLORIDE 0.9 % IV SOLN
320.0000 mg/m2 | Freq: Once | INTRAVENOUS | Status: AC
  Administered 2022-08-05: 628 mg via INTRAVENOUS
  Filled 2022-08-05: qty 31.4

## 2022-08-05 MED ORDER — SODIUM CHLORIDE 0.9 % IV SOLN: Freq: Once | INTRAVENOUS | Status: AC

## 2022-08-05 MED ORDER — SODIUM CHLORIDE 0.9 % IV SOLN
5.0000 mg/kg | Freq: Once | INTRAVENOUS | Status: AC
  Administered 2022-08-05: 400 mg via INTRAVENOUS
  Filled 2022-08-05: qty 16

## 2022-08-05 MED ORDER — PALONOSETRON HCL INJECTION 0.25 MG/5ML
0.2500 mg | Freq: Once | INTRAVENOUS | Status: AC
  Administered 2022-08-05: 0.25 mg via INTRAVENOUS
  Filled 2022-08-05: qty 5

## 2022-08-05 MED ORDER — SODIUM CHLORIDE 0.9% FLUSH
10.0000 mL | Freq: Once | INTRAVENOUS | Status: AC
  Administered 2022-08-05: 10 mL via INTRAVENOUS

## 2022-08-05 MED ORDER — HEPARIN SOD (PORK) LOCK FLUSH 100 UNIT/ML IV SOLN: 500.0000 [IU] | Freq: Once | INTRAVENOUS | Status: DC | PRN

## 2022-08-05 MED ORDER — SODIUM CHLORIDE 0.9% FLUSH: 10.0000 mL | INTRAVENOUS | Status: DC | PRN

## 2022-08-05 NOTE — Progress Notes (Signed)
Maintain dose of flurouracil CIV at 3250 mg despite weight increase per Dr Delton Coombes.  Susan Martin, PharmD

## 2022-08-05 NOTE — Progress Notes (Signed)
Patient presents today for Vezelma, leucovorin, Fluorouracil with 5FU pump start.  Vital signs within parameters for treatment.  Labs pending.    Potassium noted to be 3.1, per providers orders patient will receive 40 mEq of PO Potassium.  Urine protein 100 and Platelets 78, MD aware.  Message received from Anastasio Champion RN/Dr. Delton Coombes, patient okay for treatment.  [place] checked for blood return.  Pulled 10 cc of blood and alteplase out and flushed with 10 cc Normal saline.  Treatment given today per MD orders.  Stable during infusion without adverse affects.  Verified RUN on the screen with the patient.  Vital signs stable.  No complaints at this time.  Discharge from clinic ambulatory in stable condition.  Alert and oriented X 3.  Follow up with [facility] as scheduled.

## 2022-08-05 NOTE — Patient Instructions (Signed)
[place]  Discharge Instructions: Thank you for choosing [facility] to provide your oncology and hematology care.  If you have a lab appointment with the [facility], please come in thru the Main Entrance and check in at the main information desk.  Wear comfortable clothing and clothing appropriate for easy access to any Portacath or PICC line.   We strive to give you quality time with your provider. You may need to reschedule your appointment if you arrive late (15 or more minutes).  Arriving late affects you and other patients whose appointments are after yours.  Also, if you miss three or more appointments without notifying the office, you may be dismissed from the clinic at the provider's discretion.      For prescription refill requests, have your pharmacy contact our office and allow 72 hours for refills to be completed.    Today you received the following chemotherapy and/or immunotherapy agents Leucovorin Fluororuracil Bevacizumab      To help prevent nausea and vomiting after your treatment, we encourage you to take your nausea medication as directed.  BELOW ARE SYMPTOMS THAT SHOULD BE REPORTED IMMEDIATELY: *FEVER GREATER THAN 100.4 F (38 C) OR HIGHER *CHILLS OR SWEATING *NAUSEA AND VOMITING THAT IS NOT CONTROLLED WITH YOUR NAUSEA MEDICATION *UNUSUAL SHORTNESS OF BREATH *UNUSUAL BRUISING OR BLEEDING *URINARY PROBLEMS (pain or burning when urinating, or frequent urination) *BOWEL PROBLEMS (unusual diarrhea, constipation, pain near the anus) TENDERNESS IN MOUTH AND THROAT WITH OR WITHOUT PRESENCE OF ULCERS (sore throat, sores in mouth, or a toothache) UNUSUAL RASH, SWELLING OR PAIN  UNUSUAL VAGINAL DISCHARGE OR ITCHING   Items with * indicate a potential emergency and should be followed up as soon as possible or go to the Emergency Department if any problems should occur.  Please show the CHEMOTHERAPY ALERT CARD or IMMUNOTHERAPY ALERT CARD  at check-in to the Emergency Department and triage nurse.  Should you have questions after your visit or need to cancel or reschedule your appointment, please contact [facility] [phone]  and follow the prompts.  Office hours are 8:00 a.m. to 4:30 p.m. Monday - Friday. Please note that voicemails left after 4:00 p.m. may not be returned until the following business day.  We are closed weekends and major holidays. You have access to a nurse at all times for urgent questions. Please call the main number to the clinic [phone] and follow the prompts.  For any non-urgent questions, you may also contact your provider using MyChart. We now offer e-Visits for anyone 30 and older to request care online for non-urgent symptoms. For details visit [email or web address].   Also download the MyChart app! Go to the app store, search "MyChart", open the app, select [facility], and log in with your MyChart username and password.

## 2022-08-07 ENCOUNTER — Other Ambulatory Visit: Payer: Self-pay | Admitting: Hematology

## 2022-08-07 ENCOUNTER — Other Ambulatory Visit: Payer: Self-pay | Admitting: *Deleted

## 2022-08-07 ENCOUNTER — Inpatient Hospital Stay: Payer: Medicaid Other

## 2022-08-07 VITALS — BP 151/99 | HR 69 | Temp 98.1°F | Resp 18

## 2022-08-07 DIAGNOSIS — Z5112 Encounter for antineoplastic immunotherapy: Secondary | ICD-10-CM | POA: Diagnosis not present

## 2022-08-07 DIAGNOSIS — C787 Secondary malignant neoplasm of liver and intrahepatic bile duct: Secondary | ICD-10-CM

## 2022-08-07 MED ORDER — SODIUM CHLORIDE 0.9% FLUSH
10.0000 mL | INTRAVENOUS | Status: DC | PRN
  Administered 2022-08-07: 10 mL

## 2022-08-07 MED ORDER — AMLODIPINE BESYLATE 5 MG PO TABS: 5.0000 mg | ORAL_TABLET | Freq: Every day | ORAL | 1 refills | Status: DC

## 2022-08-07 MED ORDER — HEPARIN SOD (PORK) LOCK FLUSH 100 UNIT/ML IV SOLN
500.0000 [IU] | Freq: Once | INTRAVENOUS | Status: AC | PRN
  Administered 2022-08-07: 500 [IU]

## 2022-08-07 NOTE — Progress Notes (Signed)
Patient presents today for pump d/c. Vital signs are stable. Port a cath site clean, dry, and intact. Port flushed with 10 mls of Normal Saline and 500 Units of Heparin. Needle removed intact. Band aid applied. Patient has complaints of a sore throat and cough. Message sent to Dr. Delton Coombes pertaining to patient requests something for a cough. Patient states she needs her Amlodipine 5 mg refilled and she is out of her blood pressure medication and no refills ordered by Dr. Ludwig Lean. Message sent to Dr. Delton Coombes at this time. Discharged from clinic ambulatory and in stable condition. Patient alert and oriented.

## 2022-08-07 NOTE — Patient Instructions (Signed)
[place]  Discharge Instructions: Thank you for choosing [facility] to provide your oncology and hematology care.  If you have a lab appointment with the [facility], please come in thru the Main Entrance and check in at the main information desk.  Wear comfortable clothing and clothing appropriate for easy access to any Portacath or PICC line.   We strive to give you quality time with your provider. You may need to reschedule your appointment if you arrive late (15 or more minutes).  Arriving late affects you and other patients whose appointments are after yours.  Also, if you miss three or more appointments without notifying the office, you may be dismissed from the clinic at the provider's discretion.      For prescription refill requests, have your pharmacy contact our office and allow 72 hours for refills to be completed.    Today you received the following chemotherapy and/or immunotherapy agents Adrucil. Fluorouracil Injection What is this medication? FLUOROURACIL (flure oh YOOR a sil) treats some types of cancer. It works by slowing down the growth of cancer cells. This medicine may be used for other purposes; ask your health care provider or pharmacist if you have questions. COMMON BRAND NAME(S): Adrucil What should I tell my care team before I take this medication? They need to know if you have any of these conditions: Blood disorders Dihydropyrimidine dehydrogenase (DPD) deficiency Infection, such as chickenpox, cold sores, herpes Kidney disease Liver disease Poor nutrition Recent or ongoing radiation therapy An unusual or allergic reaction to fluorouracil, other medications, foods, dyes, or preservatives If you or your partner are pregnant or trying to get pregnant Breast-feeding How should I use this medication? This medication is injected into a vein. It is administered by your care team in a hospital or clinic setting. Talk to  your care team about the use of this medication in children. Special care may be needed. Overdosage: If you think you have taken too much of this medicine contact a poison control center or emergency room at once. NOTE: This medicine is only for you. Do not share this medicine with others. What if I miss a dose? Keep appointments for follow-up doses. It is important not to miss your dose. Call your care team if you are unable to keep an appointment. What may interact with this medication? Do not take this medication with any of the following: Live virus vaccines This medication may also interact with the following: Medications that treat or prevent blood clots, such as warfarin, enoxaparin, dalteparin This list may not describe all possible interactions. Give your health care provider a list of all the medicines, herbs, non-prescription drugs, or dietary supplements you use. Also tell them if you smoke, drink alcohol, or use illegal drugs. Some items may interact with your medicine. What should I watch for while using this medication? Your condition will be monitored carefully while you are receiving this medication. This medication may make you feel generally unwell. This is not uncommon as chemotherapy can affect healthy cells as well as cancer cells. Report any side effects. Continue your course of treatment even though you feel ill unless your care team tells you to stop. In some cases, you may be given additional medications to help with side effects. Follow all directions for their use. This medication may increase your risk of getting an infection. Call your care team for advice if you get a fever, chills, sore throat, or other symptoms of  a cold or flu. Do not treat yourself. Try to avoid being around people who are sick. This medication may increase your risk to bruise or bleed. Call your care team if you notice any unusual bleeding. Be careful brushing or flossing your teeth or using a  toothpick because you may get an infection or bleed more easily. If you have any dental work done, tell your dentist you are receiving this medication. Avoid taking medications that contain aspirin, acetaminophen, ibuprofen, naproxen, or ketoprofen unless instructed by your care team. These medications may hide a fever. Do not treat diarrhea with over the counter products. Contact your care team if you have diarrhea that lasts more than 2 days or if it is severe and watery. This medication can make you more sensitive to the sun. Keep out of the sun. If you cannot avoid being in the sun, wear protective clothing and sunscreen. Do not use sun lamps, tanning beds, or tanning booths. Talk to your care team if you or your partner wish to become pregnant or think you might be pregnant. This medication can cause serious birth defects if taken during pregnancy and for 3 months after the last dose. A reliable form of contraception is recommended while taking this medication and for 3 months after the last dose. Talk to your care team about effective forms of contraception. Do not father a child while taking this medication and for 3 months after the last dose. Use a condom while having sex during this time period. Do not breastfeed while taking this medication. This medication may cause infertility. Talk to your care team if you are concerned about your fertility. What side effects may I notice from receiving this medication? Side effects that you should report to your care team as soon as possible: Allergic reactions--skin rash, itching, hives, swelling of the face, lips, tongue, or throat Heart attack--pain or tightness in the chest, shoulders, arms, or jaw, nausea, shortness of breath, cold or clammy skin, feeling faint or lightheaded Heart failure--shortness of breath, swelling of the ankles, feet, or hands, sudden weight gain, unusual weakness or fatigue Heart rhythm changes--fast or irregular heartbeat,  dizziness, feeling faint or lightheaded, chest pain, trouble breathing High ammonia level--unusual weakness or fatigue, confusion, loss of appetite, nausea, vomiting, seizures Infection--fever, chills, cough, sore throat, wounds that don't heal, pain or trouble when passing urine, general feeling of discomfort or being unwell Low red blood cell level--unusual weakness or fatigue, dizziness, headache, trouble breathing Pain, tingling, or numbness in the hands or feet, muscle weakness, change in vision, confusion or trouble speaking, loss of balance or coordination, trouble walking, seizures Redness, swelling, and blistering of the skin over hands and feet Severe or prolonged diarrhea Unusual bruising or bleeding Side effects that usually do not require medical attention (report to your care team if they continue or are bothersome): Dry skin Headache Increased tears Nausea Pain, redness, or swelling with sores inside the mouth or throat Sensitivity to light Vomiting This list may not describe all possible side effects. Call your doctor for medical advice about side effects. You may report side effects to FDA at 1-800-FDA-1088. Where should I keep my medication? This medication is given in a hospital or clinic. It will not be stored at home. NOTE: This sheet is a summary. It may not cover all possible information. If you have questions about this medicine, talk to your doctor, pharmacist, or health care provider.  2023 Elsevier/Gold Standard (2021-10-14 00:00:00)  To help prevent nausea and vomiting after your treatment, we encourage you to take your nausea medication as directed.  BELOW ARE SYMPTOMS THAT SHOULD BE REPORTED IMMEDIATELY: *FEVER GREATER THAN 100.4 F (38 C) OR HIGHER *CHILLS OR SWEATING *NAUSEA AND VOMITING THAT IS NOT CONTROLLED WITH YOUR NAUSEA MEDICATION *UNUSUAL SHORTNESS OF BREATH *UNUSUAL BRUISING OR BLEEDING *URINARY PROBLEMS (pain or burning when urinating, or  frequent urination) *BOWEL PROBLEMS (unusual diarrhea, constipation, pain near the anus) TENDERNESS IN MOUTH AND THROAT WITH OR WITHOUT PRESENCE OF ULCERS (sore throat, sores in mouth, or a toothache) UNUSUAL RASH, SWELLING OR PAIN  UNUSUAL VAGINAL DISCHARGE OR ITCHING   Items with * indicate a potential emergency and should be followed up as soon as possible or go to the Emergency Department if any problems should occur.  Please show the CHEMOTHERAPY ALERT CARD or IMMUNOTHERAPY ALERT CARD at check-in to the Emergency Department and triage nurse.  Should you have questions after your visit or need to cancel or reschedule your appointment, please contact [facility] [phone]  and follow the prompts.  Office hours are 8:00 a.m. to 4:30 p.m. Monday - Friday. Please note that voicemails left after 4:00 p.m. may not be returned until the following business day.  We are closed weekends and major holidays. You have access to a nurse at all times for urgent questions. Please call the main number to the clinic [phone] and follow the prompts.  For any non-urgent questions, you may also contact your provider using MyChart. We now offer e-Visits for anyone 67 and older to request care online for non-urgent symptoms. For details visit [email or web address].   Also download the MyChart app! Go to the app store, search "MyChart", open the app, select [facility], and log in with your MyChart username and password.

## 2022-08-12 ENCOUNTER — Other Ambulatory Visit: Payer: Self-pay | Admitting: *Deleted

## 2022-08-12 MED ORDER — GABAPENTIN 100 MG PO CAPS: ORAL_CAPSULE | ORAL | 3 refills | Status: DC

## 2022-08-12 MED ORDER — TRIAMTERENE-HCTZ 37.5-25 MG PO TABS: 1.0000 | ORAL_TABLET | Freq: Every day | ORAL | 3 refills | Status: DC

## 2022-08-13 ENCOUNTER — Ambulatory Visit (HOSPITAL_COMMUNITY)
Admission: RE | Admit: 2022-08-13 | Discharge: 2022-08-13 | Disposition: A | Discharge status: Home / Self Care | Payer: Medicaid Other | Source: Ambulatory Visit | Attending: Hematology | Admitting: Hematology

## 2022-08-13 DIAGNOSIS — C787 Secondary malignant neoplasm of liver and intrahepatic bile duct: Secondary | ICD-10-CM | POA: Insufficient documentation

## 2022-08-13 DIAGNOSIS — C189 Malignant neoplasm of colon, unspecified: Secondary | ICD-10-CM | POA: Insufficient documentation

## 2022-08-13 MED ORDER — IOHEXOL 9 MG/ML PO SOLN
ORAL | Status: AC
  Filled 2022-08-13: qty 1000

## 2022-08-13 MED ORDER — IOHEXOL 300 MG/ML  SOLN
100.0000 mL | Freq: Once | INTRAMUSCULAR | Status: AC | PRN
  Administered 2022-08-13: 100 mL via INTRAVENOUS

## 2022-08-18 ENCOUNTER — Other Ambulatory Visit: Payer: Self-pay | Admitting: *Deleted

## 2022-08-18 ENCOUNTER — Other Ambulatory Visit: Payer: Self-pay

## 2022-08-18 DIAGNOSIS — F419 Anxiety disorder, unspecified: Secondary | ICD-10-CM

## 2022-08-18 DIAGNOSIS — C189 Malignant neoplasm of colon, unspecified: Secondary | ICD-10-CM

## 2022-08-18 DIAGNOSIS — E876 Hypokalemia: Secondary | ICD-10-CM

## 2022-08-18 MED ORDER — PAROXETINE HCL 40 MG PO TABS: 40.0000 mg | ORAL_TABLET | ORAL | 3 refills | Status: DC

## 2022-08-18 MED ORDER — AMLODIPINE BESYLATE 5 MG PO TABS: 5.0000 mg | ORAL_TABLET | Freq: Every day | ORAL | 3 refills | Status: DC

## 2022-08-18 MED ORDER — POTASSIUM CHLORIDE CRYS ER 10 MEQ PO TBCR: 20.0000 meq | EXTENDED_RELEASE_TABLET | Freq: Every day | ORAL | 3 refills | Status: DC

## 2022-08-18 MED ORDER — CLONAZEPAM 1 MG PO TABS: 1.0000 mg | ORAL_TABLET | Freq: Three times a day (TID) | ORAL | 3 refills | Status: DC

## 2022-08-19 ENCOUNTER — Inpatient Hospital Stay: Payer: Medicaid Other

## 2022-08-19 ENCOUNTER — Other Ambulatory Visit: Payer: Self-pay | Admitting: *Deleted

## 2022-08-19 ENCOUNTER — Inpatient Hospital Stay (HOSPITAL_BASED_OUTPATIENT_CLINIC_OR_DEPARTMENT_OTHER): Payer: Medicaid Other | Admitting: Hematology

## 2022-08-19 VITALS — BP 149/79 | HR 83 | Temp 98.4°F | Resp 18 | Wt 181.6 lb

## 2022-08-19 VITALS — BP 156/82 | HR 79 | Temp 97.5°F | Resp 20

## 2022-08-19 VITALS — BP 149/79 | HR 83 | Temp 98.4°F | Resp 18 | Wt 181.0 lb

## 2022-08-19 DIAGNOSIS — C189 Malignant neoplasm of colon, unspecified: Secondary | ICD-10-CM

## 2022-08-19 DIAGNOSIS — Z5112 Encounter for antineoplastic immunotherapy: Secondary | ICD-10-CM | POA: Diagnosis not present

## 2022-08-19 DIAGNOSIS — Z95828 Presence of other vascular implants and grafts: Secondary | ICD-10-CM

## 2022-08-19 DIAGNOSIS — C787 Secondary malignant neoplasm of liver and intrahepatic bile duct: Secondary | ICD-10-CM | POA: Diagnosis not present

## 2022-08-19 DIAGNOSIS — F419 Anxiety disorder, unspecified: Secondary | ICD-10-CM

## 2022-08-19 LAB — CBC WITH DIFFERENTIAL/PLATELET
Abs Immature Granulocytes: 0.01 10*3/uL (ref 0.00–0.07)
Basophils Absolute: 0 10*3/uL (ref 0.0–0.1)
Basophils Relative: 1 %
Eosinophils Absolute: 0.1 10*3/uL (ref 0.0–0.5)
Eosinophils Relative: 2 %
HCT: 40.1 % (ref 36.0–46.0)
Hemoglobin: 13.4 g/dL (ref 12.0–15.0)
Immature Granulocytes: 0 %
Lymphocytes Relative: 33 %
Lymphs Abs: 1.2 10*3/uL (ref 0.7–4.0)
MCH: 31.4 pg (ref 26.0–34.0)
MCHC: 33.4 g/dL (ref 30.0–36.0)
MCV: 93.9 fL (ref 80.0–100.0)
Monocytes Absolute: 0.4 10*3/uL (ref 0.1–1.0)
Monocytes Relative: 10 %
Neutro Abs: 1.9 10*3/uL (ref 1.7–7.7)
Neutrophils Relative %: 54 %
Platelets: 107 10*3/uL — ABNORMAL LOW (ref 150–400)
RBC: 4.27 MIL/uL (ref 3.87–5.11)
RDW: 17.5 % — ABNORMAL HIGH (ref 11.5–15.5)
WBC: 3.6 10*3/uL — ABNORMAL LOW (ref 4.0–10.5)
nRBC: 0 % (ref 0.0–0.2)

## 2022-08-19 LAB — URINALYSIS, DIPSTICK ONLY
Bilirubin Urine: NEGATIVE
Glucose, UA: NEGATIVE mg/dL
Hgb urine dipstick: NEGATIVE
Ketones, ur: NEGATIVE mg/dL
Leukocytes,Ua: NEGATIVE
Nitrite: NEGATIVE
Protein, ur: 30 mg/dL — AB
Specific Gravity, Urine: 1.02 (ref 1.005–1.030)
pH: 6 (ref 5.0–8.0)

## 2022-08-19 LAB — COMPREHENSIVE METABOLIC PANEL WITH GFR
ALT: 31 U/L (ref 0–44)
AST: 46 U/L — ABNORMAL HIGH (ref 15–41)
Albumin: 3.5 g/dL (ref 3.5–5.0)
Alkaline Phosphatase: 107 U/L (ref 38–126)
Anion gap: 11 (ref 5–15)
BUN: 10 mg/dL (ref 8–23)
CO2: 24 mmol/L (ref 22–32)
Calcium: 8.6 mg/dL — ABNORMAL LOW (ref 8.9–10.3)
Chloride: 99 mmol/L (ref 98–111)
Creatinine, Ser: 0.87 mg/dL (ref 0.44–1.00)
GFR, Estimated: >60 mL/min
Glucose, Bld: 121 mg/dL — ABNORMAL HIGH (ref 70–99)
Potassium: 3.6 mmol/L (ref 3.5–5.1)
Sodium: 134 mmol/L — ABNORMAL LOW (ref 135–145)
Total Bilirubin: 0.9 mg/dL (ref 0.3–1.2)
Total Protein: 6.7 g/dL (ref 6.5–8.1)

## 2022-08-19 LAB — MAGNESIUM: Magnesium: 1.9 mg/dL (ref 1.7–2.4)

## 2022-08-19 MED ORDER — SODIUM CHLORIDE 0.9 % IV SOLN
10.0000 mg | Freq: Once | INTRAVENOUS | Status: AC
  Administered 2022-08-19: 10 mg via INTRAVENOUS
  Filled 2022-08-19: qty 10

## 2022-08-19 MED ORDER — PAROXETINE HCL 40 MG PO TABS: 40.0000 mg | ORAL_TABLET | ORAL | 3 refills | Status: DC

## 2022-08-19 MED ORDER — SODIUM CHLORIDE 0.9 % IV SOLN
3250.0000 mg | INTRAVENOUS | Status: DC
  Administered 2022-08-19: 3250 mg via INTRAVENOUS
  Filled 2022-08-19: qty 65

## 2022-08-19 MED ORDER — SODIUM CHLORIDE 0.9 % IV SOLN: Freq: Once | INTRAVENOUS | Status: AC

## 2022-08-19 MED ORDER — HEPARIN SOD (PORK) LOCK FLUSH 100 UNIT/ML IV SOLN: 500.0000 [IU] | Freq: Once | INTRAVENOUS | Status: DC | PRN

## 2022-08-19 MED ORDER — CLONAZEPAM 1 MG PO TABS: 1.0000 mg | ORAL_TABLET | Freq: Three times a day (TID) | ORAL | 3 refills | Status: DC

## 2022-08-19 MED ORDER — SODIUM CHLORIDE 0.9% FLUSH: 10.0000 mL | INTRAVENOUS | Status: DC | PRN

## 2022-08-19 MED ORDER — PALONOSETRON HCL INJECTION 0.25 MG/5ML
0.2500 mg | Freq: Once | INTRAVENOUS | Status: AC
  Administered 2022-08-19: 0.25 mg via INTRAVENOUS
  Filled 2022-08-19: qty 5

## 2022-08-19 MED ORDER — LEUCOVORIN CALCIUM INJECTION 350 MG
320.0000 mg/m2 | Freq: Once | INTRAVENOUS | Status: AC
  Administered 2022-08-19: 628 mg via INTRAVENOUS
  Filled 2022-08-19: qty 31.4

## 2022-08-19 MED ORDER — SODIUM CHLORIDE 0.9% FLUSH
10.0000 mL | INTRAVENOUS | Status: DC | PRN
  Administered 2022-08-19: 10 mL via INTRAVENOUS

## 2022-08-19 MED ORDER — SODIUM CHLORIDE 0.9 % IV SOLN
5.0000 mg/kg | Freq: Once | INTRAVENOUS | Status: AC
  Administered 2022-08-19: 400 mg via INTRAVENOUS
  Filled 2022-08-19: qty 16

## 2022-08-19 MED ORDER — FLUOROURACIL CHEMO INJECTION 500 MG/10ML
270.0000 mg/m2 | Freq: Once | INTRAVENOUS | Status: AC
  Administered 2022-08-19: 500 mg via INTRAVENOUS
  Filled 2022-08-19: qty 10

## 2022-08-19 NOTE — Progress Notes (Signed)
Patient presents today for Bevacizumab.Leucovorin/Fluorouracil with 5FU pump start per providers order.  Vital signs and labs within parameters for treatment.    Treatment given today per MD orders.  Stable during infusion without adverse affects.  Vital signs stable.  5FU pump start, verified RUN on the screen with the patient.  No complaints at this time.  Discharge from clinic ambulatory in stable condition.  Alert and oriented X 3.  Follow up with [facility] as scheduled.

## 2022-08-19 NOTE — Patient Instructions (Signed)
[place] at [facility] Discharge Instructions   You were seen and examined today by Dr. Delton Coombes.  He reviewed the results of your CT scan which was stable. No new evidence of cancer in the chest, abdomen, or pelvis.   He reviewed the results of your lab work which are normal/stable.   We will proceed with your treatment today.   Return as scheduled.    Thank you for choosing [place] at [facility] to provide your oncology and hematology care.  To afford each patient quality time with our provider, please arrive at least 15 minutes before your scheduled appointment time.   If you have a lab appointment with the [facility] please come in thru the Main Entrance and check in at the main information desk.  You need to re-schedule your appointment should you arrive 10 or more minutes late.  We strive to give you quality time with our providers, and arriving late affects you and other patients whose appointments are after yours.  Also, if you no show three or more times for appointments you may be dismissed from the clinic at the providers discretion.     Again, thank you for choosing [facility].  Our hope is that these requests will decrease the amount of time that you wait before being seen by our physicians.       _____________________________________________________________  Should you have questions after your visit to [facility], please contact our office at [phone] and follow the prompts.  Our office hours are 8:00 a.m. and 4:30 p.m. Monday - Friday.  Please note that voicemails left after 4:00 p.m. may not be returned until the following business day.  We are closed weekends and major holidays.  You do have access to a nurse 24-7, just call the main number to the clinic [phone] and do not press any options, hold on the line and a nurse will answer the phone.    For prescription refill requests,  have your pharmacy contact our office and allow 72 hours.    Due to Covid, you will need to wear a mask upon entering the hospital. If you do not have a mask, a mask will be given to you at the Main Entrance upon arrival. For doctor visits, patients may have 1 support person age 32 or older with them. For treatment visits, patients can not have anyone with them due to social distancing guidelines and our immunocompromised population.

## 2022-08-19 NOTE — Progress Notes (Signed)
Patient has been examined by Dr. Delton Coombes. Vital signs and labs have been reviewed by MD - ANC, Creatinine, LFTs, hemoglobin, and platelets are within treatment parameters per M.D. - pt may proceed with treatment.  Primary RN and pharmacy notified.

## 2022-08-19 NOTE — Progress Notes (Signed)
Patients port flushed without difficulty.  Good blood return noted with no bruising or swelling noted at site.  Patient remains accessed for chemotherapy treatment.  

## 2022-08-19 NOTE — Progress Notes (Signed)
[facility] [address] [place] [address]    Clinic Day:  08/19/2022  Referring physician: Derek Jack, MD  Patient Care Team: Patient, No Pcp Per as PCP - General (General Practice) Brien Mates, RN as Oncology Nurse Navigator (Oncology)   ASSESSMENT & PLAN:   Assessment: 1.  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 -CT chest on 05/02/2020 with no evidence of pulmonary metastatic disease. -CTAP on 05/07/2020 with multiple lesions throughout the liver, largest in the right lobe measuring 3.9 cm, lateral segment left lobe measuring 2.6 cm and inferior right lobe confluent lesion 4.2 x 3.7 cm.  No lymphadenopathy. -CEA on 05/02/2020-60.2. -FOLFOX and bevacizumab started on 06/04/2020. 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   2.  Iron deficiency anemia: -Feraheme on 04/30/2020 and 05/06/2020.   3.  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 -Smoked for 3 to 4 years and quit. -Mother had cancer, type unknown.  Maternal uncle had lung cancer.  Maternal aunt had lung cancer.  Plan: 1.  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  Otherwise stable liver lesions.  No new metastatic disease.  Mild splenomegaly. - Last CEA was 3.7. - UA shows protein of 30 Mg per DL.  LFTs are normal except AST of 46.  CBC was grossly normal with mild thrombocytopenia and leukopenia. - Continue 5-FU and bevacizumab maintenance every 2 weeks.  RTC 6 weeks for follow-up.   2.  Hypertension: - Continue triamterene/HCTZ and Norvasc 5  mg daily.  Blood pressure is 149/79.   3.  Severe hypokalemia: - Continue potassium 10 mEq daily.  Potassium is 3.6.   4.  Peripheral neuropathy: - This is well-controlled.  Continue gabapentin 100 mg 3 times daily.   5.  Anxiety: - Continue Klonopin 1 mg 3 times daily.  Continue Paxil 40 mg daily.  6.  Mucositis: - Continue Vaseline and lidocaine gel as needed.  Orders Placed This Encounter  Procedures   Magnesium    Standing Status:   Future    Standing Expiration Date:   10/28/2023   CBC with Differential    Standing Status:   Future    Standing Expiration Date:   10/29/2023   Comprehensive metabolic panel    Standing Status:   Future    Standing Expiration Date:   10/29/2023   Urinalysis, dipstick only    Standing Status:   Future    Standing Expiration Date:   10/29/2023   Magnesium    Standing Status:   Future    Standing Expiration Date:   11/11/2023   CBC with Differential    Standing Status:   Future    Standing Expiration Date:   11/12/2023   Comprehensive metabolic panel    Standing Status:   Future    Standing Expiration Date:   11/12/2023   Urinalysis, dipstick only    Standing Status:   Future    Standing Expiration Date:   11/12/2023   Magnesium    Standing Status:   Future    Standing Expiration Date:   11/25/2023   CBC with Differential    Standing Status:   Future    Standing Expiration  Date:   11/26/2023   Comprehensive metabolic panel    Standing Status:   Future    Standing Expiration Date:   11/26/2023   Urinalysis, dipstick only    Standing Status:   Future    Standing Expiration Date:   11/26/2023   Magnesium    Standing Status:   Future    Standing Expiration Date:   12/09/2023   CBC with Differential    Standing Status:   Future    Standing Expiration Date:   12/10/2023   Comprehensive metabolic panel    Standing Status:   Future    Standing Expiration Date:   12/10/2023   Urinalysis, dipstick only    Standing Status:   Future    Standing  Expiration Date:   12/10/2023      Beverly Gust Oliver,acting as a scribe for Derek Jack, MD.,have documented all relevant documentation on the behalf of Derek Jack, MD,as directed by  Derek Jack, MD while in the presence of Derek Jack, MD.   I, Derek Jack MD, have reviewed the above documentation for accuracy and completeness, and I agree with the above.   Derek Jack, MD   2/21/20245:31 PM  CHIEF COMPLAINT:   Diagnosis: metastatic colon cancer to liver    Cancer Staging  No matching staging information was found for the patient.   Prior Therapy: Sigmoid colectomy and end colostomy on 04/29/2020   Current Therapy:  Maintenance 5-FU and bevacizumab     HISTORY OF PRESENT ILLNESS:   Oncology History  Metastatic colon cancer to liver ([place])  05/16/2020 Initial Diagnosis   Metastatic colon cancer to liver ([place])   06/03/2020 Genetic Testing   Foundation One:     06/04/2020 - 02/25/2022 Chemotherapy   Patient is on Treatment Plan : COLORECTAL FOLFOX + Bevacizumab q14d     06/04/2020 -  Chemotherapy   Patient is on Treatment Plan : COLORECTAL FOLFOX + Bevacizumab q14d        INTERVAL HISTORY:   Susan Martin is a 62 y.o. female presenting to clinic today for follow up of metastatic colon cancer to liver . She was last seen by me on 07/22/2022.  Today, she states that she is doing well overall. Her appetite level is at 80%. Her energy level is at 70%. She states that everything is fine today. She took Robitussin for help with recent cough. She denies  any notices  nose bleed and all other problems.     PAST MEDICAL HISTORY:   Past Medical History: Past Medical History:  Diagnosis Date   Adenocarcinoma of colon metastatic to liver [facility]) onocology--- dr s. Delton Coombes   04-29-2020 [facility] surgery for perforated colon s/p sigmoid colectomy w/ colostomy; dx  Stage IV colon cancer mets to liver   Anxiety    Depression     Hypertension    followed by dr Glo Herring and oncology  (05-27-2020 per pt does not have pcp yet)   IDA (iron deficiency anemia)    Pulmonary nodule, right     Surgical History: Past Surgical History:  Procedure Laterality Date   ABDOMINAL HYSTERECTOMY  03-31-2016   @[facility]    W/  BILATERAL SALPINOOPHORECTOMY    APPLICATION OF WOUND VAC N/A 04/29/2020   Procedure: APPLICATION OF WOUND VAC;  Surgeon: Mickeal Skinner, MD;  Location: [facility];  Service: General;  Laterality: N/A;   ENDOMETRIAL ABLATION     LAPAROTOMY N/A 04/29/2020   Procedure: EXPLORATORY LAPAROTOMY;  Surgeon: Mickeal Skinner, MD;  Location:  [facility];  Service: General;  Laterality: N/A;   PARTIAL COLECTOMY N/A 04/29/2020   Procedure: PARTIAL COLECTOMY WITH END COLOSTOMY;  Surgeon: Kinsinger, Arta Bruce, MD;  Location: [facility];  Service: General;  Laterality: N/A;   PORTACATH PLACEMENT Right 05/28/2020   Procedure: INSERTION PORT-A-CATH;  Surgeon: Kieth Brightly Arta Bruce, MD;  Location: [facility];  Service: General;  Laterality: Right;   SALPINGOOPHORECTOMY Left 03/31/2016   Procedure: LEFT SALPINGO OOPHORECTOMY WITH FROZEN SECTION,  ABDOMINAL HYSTERECTOMY WITH BILATERAL SALPINGO-OOPHORECTOMY;  Surgeon: Jonnie Kind, MD;  Location: [facility];  Service: Gynecology;  Laterality: Left;    Social History: Social History   Socioeconomic History   Marital status: Divorced    Spouse name: Not on file   Number of children: Not on file   Years of education: Not on file   Highest education level: Not on file  Occupational History   Not on file  Tobacco Use   Smoking status: Former    Packs/day: 0.50    Years: 5.00    Total pack years: 2.50    Types: Cigarettes    Quit date: 03/28/2015    Years since quitting: 7.4   Smokeless tobacco: Never  Vaping Use   Vaping Use: Never used  Substance and Sexual Activity   Alcohol use: No   Drug use: Never   Sexual activity: Yes    Partners: Male    Birth  control/protection: Surgical  Other Topics Concern   Not on file  Social History Narrative   Not on file   Social Determinants of Health   Financial Resource Strain: Low Risk  (11/01/2019)   Overall Financial Resource Strain (CARDIA)    Difficulty of Paying Living Expenses: Not very hard  Food Insecurity: No Food Insecurity (11/01/2019)   Hunger Vital Sign    Worried About Running Out of Food in the Last Year: Never true    Ran Out of Food in the Last Year: Never true  Transportation Needs: No Transportation Needs (11/01/2019)   PRAPARE - Hydrologist (Medical): No    Lack of Transportation (Non-Medical): No  Physical Activity: Insufficiently Active (11/01/2019)   Exercise Vital Sign    Days of Exercise per Week: 2 days    Minutes of Exercise per Session: 30 min  Stress: Stress Concern Present (11/01/2019)   [place]    Feeling of Stress : To some extent  Social Connections: Moderately Integrated (11/01/2019)   Social Connection and Isolation Panel [NHANES]    Frequency of Communication with Friends and Family: More than three times a week    Frequency of Social Gatherings with Friends and Family: Once a week    Attends Religious Services: More than 4 times per year    Active Member of [employer] or Organizations: No    Attends Archivist Meetings: Never    Marital Status: Living with partner  Intimate Partner Violence: Not At Risk (11/01/2019)   Humiliation, Afraid, Rape, and Kick questionnaire    Fear of Current or Ex-Partner: No    Emotionally Abused: No    Physically Abused: No    Sexually Abused: No    Family History: Family History  Problem Relation Age of Onset   Hypertension Mother    Diabetes Mother    Cirrhosis Father     Current Medications:  Current Outpatient Medications:    amLODipine (NORVASC) 5 MG tablet, Take 1 tablet (5  mg total) by mouth daily., Disp: 90  tablet, Rfl: 3   BEVACIZUMAB IV, Inject 5 mg/kg into the vein every 14 (fourteen) days., Disp: , Rfl:    fluorouracil CALGB 30160 in sodium chloride 0.9 % 150 mL, Inject 2,400 mg/m2 into the vein over 48 hr., Disp: , Rfl:    gabapentin (NEURONTIN) 100 MG capsule, TAKE 1 CAPSULE (100 MG TOTAL) BY MOUTH THREE TIMES DAILY., Disp: 90 capsule, Rfl: 3   LEUCOVORIN CALCIUM IV, Inject 400 mg/m2 into the vein every 21 ( twenty-one) days., Disp: , Rfl:    lidocaine (XYLOCAINE) 2 % solution, Use as directed 15 mLs in the mouth or throat every 6 (six) hours as needed for mouth pain. Swish and spit/swallow every six hours as needed for mouth pain, Disp: 480 mL, Rfl: 3   nystatin (MYCOSTATIN) 100000 UNIT/ML suspension, Take by mouth., Disp: , Rfl:    polyethylene glycol (MIRALAX / GLYCOLAX) packet, Take 17 g by mouth every other day., Disp: , Rfl:    potassium chloride (KLOR-CON M10) 10 MEQ tablet, Take 2 tablets (20 mEq total) by mouth daily., Disp: 180 tablet, Rfl: 3   triamterene-hydrochlorothiazide (MAXZIDE-25) 37.5-25 MG tablet, Take 1 tablet by mouth daily., Disp: 90 tablet, Rfl: 3   clonazePAM (KLONOPIN) 1 MG tablet, Take 1 tablet (1 mg total) by mouth in the morning, at noon, and at bedtime., Disp: 90 tablet, Rfl: 3   PARoxetine (PAXIL) 40 MG tablet, Take 1 tablet (40 mg total) by mouth every morning., Disp: 90 tablet, Rfl: 3 No current facility-administered medications for this visit.  Facility-Administered Medications Ordered in Other Visits:    fluorouracil (ADRUCIL) 3,250 mg in sodium chloride 0.9 % 85 mL chemo infusion, 3,250 mg, Intravenous, 1 day or 1 dose, Derek Jack, MD, Infusion Verify at 08/19/22 1252   heparin lock flush 100 unit/mL, 500 Units, Intracatheter, Once PRN, Derek Jack, MD   sodium chloride flush (NS) 0.9 % injection 10 mL, 10 mL, Intracatheter, PRN, Derek Jack, MD   Allergies: No Known Allergies  REVIEW OF SYSTEMS:   Review of Systems   Constitutional:  Negative for chills, fatigue and fever.  HENT:   Positive for sore throat. Negative for lump/mass, mouth sores, nosebleeds and trouble swallowing.   Eyes:  Negative for eye problems.  Respiratory:  Positive for shortness of breath. Negative for cough.   Cardiovascular:  Negative for chest pain, leg swelling and palpitations.  Gastrointestinal:  Positive for diarrhea. Negative for abdominal pain, constipation, nausea and vomiting.  Genitourinary:  Negative for bladder incontinence, difficulty urinating, dysuria, frequency, hematuria and nocturia.   Musculoskeletal:  Negative for arthralgias, back pain, flank pain, myalgias and neck pain.  Skin:  Negative for itching and rash.  Neurological:  Positive for numbness (Tingling in fingers). Negative for dizziness and headaches.  Hematological:  Does not bruise/bleed easily.  Psychiatric/Behavioral:  Negative for depression, sleep disturbance and suicidal ideas. The patient is not nervous/anxious.   All other systems reviewed and are negative.    VITALS:   Blood pressure (!) 149/79, pulse 83, temperature 98.4 F (36.9 C), resp. rate 18, weight 181 lb (82.1 kg).  Wt Readings from Last 3 Encounters:  08/19/22 181 lb (82.1 kg)  08/19/22 181 lb 9.6 oz (82.4 kg)  08/05/22 188 lb 12.8 oz (85.6 kg)    Body mass index is 32.06 kg/m.  Performance status (ECOG): 1 - Symptomatic but completely ambulatory  PHYSICAL EXAM:   Physical Exam Vitals and nursing note reviewed. Exam conducted  with a chaperone present.  Constitutional:      Appearance: Normal appearance.  Cardiovascular:     Rate and Rhythm: Normal rate and regular rhythm.     Pulses: Normal pulses.     Heart sounds: Normal heart sounds.  Pulmonary:     Effort: Pulmonary effort is normal.     Breath sounds: Normal breath sounds.  Abdominal:     Palpations: Abdomen is soft. There is no hepatomegaly, splenomegaly or mass.     Tenderness: There is no abdominal  tenderness.  Musculoskeletal:     Right lower leg: No edema.     Left lower leg: No edema.  Lymphadenopathy:     Cervical: No cervical adenopathy.     Right cervical: No superficial, deep or posterior cervical adenopathy.    Left cervical: No superficial, deep or posterior cervical adenopathy.     Upper Body:     Right upper body: No supraclavicular or axillary adenopathy.     Left upper body: No supraclavicular or axillary adenopathy.  Neurological:     General: No focal deficit present.     Mental Status: She is alert and oriented to person, place, and time.  Psychiatric:        Mood and Affect: Mood normal.        Behavior: Behavior normal.     LABS:      Latest Ref Rng & Units 08/19/2022    8:29 AM 08/05/2022    8:25 AM 07/22/2022    8:31 AM  CBC  WBC 4.0 - 10.5 K/uL 3.6  3.7  6.4   Hemoglobin 12.0 - 15.0 g/dL 13.4  13.6  13.3   Hematocrit 36.0 - 46.0 % 40.1  40.3  39.6   Platelets 150 - 400 K/uL 107  78  129       Latest Ref Rng & Units 08/19/2022    8:29 AM 08/05/2022    8:25 AM 07/22/2022    8:31 AM  CMP  Glucose 70 - 99 mg/dL 121  182  122   BUN 8 - 23 mg/dL 10  17  11   $ Creatinine 0.44 - 1.00 mg/dL 0.87  1.07  0.99   Sodium 135 - 145 mmol/L 134  132  132   Potassium 3.5 - 5.1 mmol/L 3.6  3.1  3.4   Chloride 98 - 111 mmol/L 99  98  98   CO2 22 - 32 mmol/L 24  21  26   $ Calcium 8.9 - 10.3 mg/dL 8.6  8.5  8.8   Total Protein 6.5 - 8.1 g/dL 6.7  7.4  7.3   Total Bilirubin 0.3 - 1.2 mg/dL 0.9  0.8  0.8   Alkaline Phos 38 - 126 U/L 107  99  83   AST 15 - 41 U/L 46  55  39   ALT 0 - 44 U/L 31  43  32      Lab Results  Component Value Date   CEA1 3.7 07/22/2022   /  CEA  Date Value Ref Range Status  07/22/2022 3.7 0.0 - 4.7 ng/mL Final    Comment:    (NOTE)                             Nonsmokers          <3.9  Smokers             <5.6 Roche [employer] Electrochemiluminescence Immunoassay (ECLIA) Values obtained with different  assay methods or kits cannot be used interchangeably.  Results cannot be interpreted as absolute evidence of the presence or absence of malignant disease. Performed At: [facility] [place], [place] [id number] Rush Farmer MD [id number]    No results found for: "PSA1" No results found for: "CAN199" No results found for: "CAN125"  No results found for: "TOTALPROTELP", "ALBUMINELP", "A1GS", "A2GS", "BETS", "BETA2SER", "GAMS", "MSPIKE", "SPEI" Lab Results  Component Value Date   TIBC 248 (L) 04/29/2020   FERRITIN 57 04/29/2020   IRONPCTSAT 3 (L) 04/29/2020   No results found for: "LDH"   STUDIES:   CT CHEST ABDOMEN PELVIS W CONTRAST  Result Date: 08/13/2022 CLINICAL DATA:  Metastatic colon cancer to the liver. * Tracking Code: BO * EXAM: CT CHEST, ABDOMEN, AND PELVIS WITH CONTRAST TECHNIQUE: Multidetector CT imaging of the chest, abdomen and pelvis was performed following the standard protocol during bolus administration of intravenous contrast. RADIATION DOSE REDUCTION: This exam was performed according to the departmental dose-optimization program which includes automated exposure control, adjustment of the mA and/or kV according to patient size and/or use of iterative reconstruction technique. CONTRAST:  120m OMNIPAQUE IOHEXOL 300 MG/ML  SOLN COMPARISON:  CT 05/07/2022 and older FINDINGS: CT CHEST FINDINGS Cardiovascular: Right upper chest port. Heart is nonenlarged. No pericardial effusion. Normal caliber thoracic aorta. Some pulsation artifacts. Mediastinum/Nodes: No specific abnormal lymph node enlargement seen in the axillary region, left hilum or mediastinum. Small right hilar node identified on image 33 is under a cm in short axis not pathologic by size criteria. 8 x 11 mm on image 33 of series 2. Simple attention on follow-up. Normal caliber thoracic esophagus. Slightly heterogeneous thyroid. Lungs/Pleura: No pneumothorax or effusion. There are some  patchy bilateral ill-defined ground-glass areas of lung opacity. Differential would include infectious or inflammatory etiology including atypical viral process. Please correlate with clinical presentation. The separate small lung nodules on the prior also stable such as the 4 mm nodule middle lobe on series 4, image 108. Musculoskeletal: Curvature of the spine. Scattered degenerative change. CT ABDOMEN PELVIS FINDINGS Hepatobiliary: Fatty liver infiltration identified. Few faint low-density liver lesions are seen. Focus measured previously in the peripheral right hepatic lobe on the prior that measured 1.9 x 1.4 cm, today on 63 for continuity measures 19 by 14 mm as well. Small foci as well seen segment 4 and 8 in the dome also stable. Additional area abutting the gallbladder fossa segment 5. No new space-occupying liver lesion. Gallbladder is present. Patent portal vein. Pancreas: Unremarkable. No pancreatic ductal dilatation or surrounding inflammatory changes. Spleen: Spleen has homogeneous enhancement. Cephalocaudal length of the spleen of 13.9 cm. Small splenule anteriorly. Adrenals/Urinary Tract: Adrenal glands are preserved. No enhancing renal mass or collecting system filling defect. Ureters have a normal course and caliber extending down to the bladder. Preserved contours of the contracted urinary bladder. Stomach/Bowel: Surgical changes with diverting colostomy along the left hemiabdomen. The proximal colon is nondilated. Prominent rectosigmoid stump. Slight thickening and scarring adjacent to the tip of the stump and suture line but no obvious mass lesion. The stomach is contracted. Small bowel is nondilated. No free air or free fluid. Vascular/Lymphatic: Mild atherosclerotic changes along the aorta and branch vessels. Preserved caliber aorta and IVC. No specific abnormal lymph node enlargement present in the abdomen and pelvis. Reproductive: Status post hysterectomy. No adnexal masses.  Other: Slight  rectus muscle diastasis with protuberance of the anterior abdominal wall in the midline and umbilicus. No ascites. Musculoskeletal: Curvature of the lumbar spine. Scattered degenerative changes seen of the spine and pelvis. IMPRESSION: CT CHEST: 1. Patchy bilateral ill-defined ground-glass areas of lung opacity. Differential would include infectious or inflammatory etiology including atypical viral process. Please correlate with clinical presentation. 2. Stable tiny lung nodules otherwise. Simple attention on follow-up for the patient's neoplasm. 3. Slightly prominent right hilar lymph node. With the lung findings this could be reactive. Attention on follow-up CT ABDOMEN/PELVIS: 1. Stable ill-defined low-density liver lesions consistent with treated metastases. No new space-occupying liver lesion. Underlying fatty liver infiltration. 2. Fatty liver infiltration. 3. Surgical changes with diverting colostomy along the left hemiabdomen. Small fat containing peristomal stump hernia. 4. No specific evidence of metastatic disease in the abdomen and pelvis. 5. Mild splenomegaly. 6. Aortic atherosclerosis. Aortic Atherosclerosis (ICD10-I70.0). Electronically Signed   By: Jill Side M.D.   On: 08/13/2022 16:09

## 2022-08-19 NOTE — Patient Instructions (Signed)
[facility]  Discharge Instructions: Thank you for choosing [facility] to provide your oncology and hematology care.  If you have a lab appointment with the [facility], please come in thru the Main Entrance and check in at the main information desk.  Wear comfortable clothing and clothing appropriate for easy access to any Portacath or PICC line.   We strive to give you quality time with your provider. You may need to reschedule your appointment if you arrive late (15 or more minutes).  Arriving late affects you and other patients whose appointments are after yours.  Also, if you miss three or more appointments without notifying the office, you may be dismissed from the clinic at the provider's discretion.      For prescription refill requests, have your pharmacy contact our office and allow 72 hours for refills to be completed.    Today you received the following chemotherapy and/or immunotherapy agents Leucovorin/bevacizumab/ fluorouracil      To help prevent nausea and vomiting after your treatment, we encourage you to take your nausea medication as directed.  BELOW ARE SYMPTOMS THAT SHOULD BE REPORTED IMMEDIATELY: *FEVER GREATER THAN 100.4 F (38 C) OR HIGHER *CHILLS OR SWEATING *NAUSEA AND VOMITING THAT IS NOT CONTROLLED WITH YOUR NAUSEA MEDICATION *UNUSUAL SHORTNESS OF BREATH *UNUSUAL BRUISING OR BLEEDING *URINARY PROBLEMS (pain or burning when urinating, or frequent urination) *BOWEL PROBLEMS (unusual diarrhea, constipation, pain near the anus) TENDERNESS IN MOUTH AND THROAT WITH OR WITHOUT PRESENCE OF ULCERS (sore throat, sores in mouth, or a toothache) UNUSUAL RASH, SWELLING OR PAIN  UNUSUAL VAGINAL DISCHARGE OR ITCHING   Items with * indicate a potential emergency and should be followed up as soon as possible or go to the Emergency Department if any problems should occur.  Please show the CHEMOTHERAPY ALERT CARD or IMMUNOTHERAPY ALERT CARD  at check-in to the Emergency Department and triage nurse.  Should you have questions after your visit or need to cancel or reschedule your appointment, please contact [facility] [phone]  and follow the prompts.  Office hours are 8:00 a.m. to 4:30 p.m. Monday - Friday. Please note that voicemails left after 4:00 p.m. may not be returned until the following business day.  We are closed weekends and major holidays. You have access to a nurse at all times for urgent questions. Please call the main number to the clinic [phone] and follow the prompts.  For any non-urgent questions, you may also contact your provider using MyChart. We now offer e-Visits for anyone 32 and older to request care online for non-urgent symptoms. For details visit [email or web address].   Also download the MyChart app! Go to the app store, search "MyChart", open the app, select [facility], and log in with your MyChart username and password.

## 2022-08-21 ENCOUNTER — Inpatient Hospital Stay: Payer: Medicaid Other

## 2022-08-21 VITALS — BP 163/84 | HR 79 | Temp 99.5°F | Resp 18

## 2022-08-21 DIAGNOSIS — C189 Malignant neoplasm of colon, unspecified: Secondary | ICD-10-CM

## 2022-08-21 DIAGNOSIS — Z5112 Encounter for antineoplastic immunotherapy: Secondary | ICD-10-CM | POA: Diagnosis not present

## 2022-08-21 LAB — CEA: CEA: 4.3 ng/mL (ref 0.0–4.7)

## 2022-08-21 MED ORDER — SODIUM CHLORIDE 0.9% FLUSH
10.0000 mL | INTRAVENOUS | Status: DC | PRN
  Administered 2022-08-21: 10 mL

## 2022-08-21 MED ORDER — HEPARIN SOD (PORK) LOCK FLUSH 100 UNIT/ML IV SOLN
500.0000 [IU] | Freq: Once | INTRAVENOUS | Status: AC | PRN
  Administered 2022-08-21: 500 [IU]

## 2022-08-21 NOTE — Patient Instructions (Signed)
[facility]   Discharge Instructions: Thank you for choosing [facility] to provide your oncology and hematology care.  If you have a lab appointment with the Cancer Center, please come in thru the Main Entrance and check in at the main information desk.  Wear comfortable clothing and clothing appropriate for easy access to any Portacath or PICC line.   We strive to give you quality time with your provider. You may need to reschedule your appointment if you arrive late (15 or more minutes).  Arriving late affects you and other patients whose appointments are after yours.  Also, if you miss three or more appointments without notifying the office, you may be dismissed from the clinic at the provider's discretion.      For prescription refill requests, have your pharmacy contact our office and allow 72 hours for refills to be completed.     To help prevent nausea and vomiting after your treatment, we encourage you to take your nausea medication as directed.  BELOW ARE SYMPTOMS THAT SHOULD BE REPORTED IMMEDIATELY: *FEVER GREATER THAN 100.4 F (38 C) OR HIGHER *CHILLS OR SWEATING *NAUSEA AND VOMITING THAT IS NOT CONTROLLED WITH YOUR NAUSEA MEDICATION *UNUSUAL SHORTNESS OF BREATH *UNUSUAL BRUISING OR BLEEDING *URINARY PROBLEMS (pain or burning when urinating, or frequent urination) *BOWEL PROBLEMS (unusual diarrhea, constipation, pain near the anus) TENDERNESS IN MOUTH AND THROAT WITH OR WITHOUT PRESENCE OF ULCERS (sore throat, sores in mouth, or a toothache) UNUSUAL RASH, SWELLING OR PAIN  UNUSUAL VAGINAL DISCHARGE OR ITCHING   Items with * indicate a potential emergency and should be followed up as soon as possible or go to the Emergency Department if any problems should occur.  Please show the CHEMOTHERAPY ALERT CARD or IMMUNOTHERAPY ALERT CARD at check-in to the Emergency Department and triage nurse.  Should you have questions after your visit or need to  cancel or reschedule your appointment, please contact [facility] AT  [phone]  and follow the prompts.  Office hours are 8:00 a.m. to 4:30 p.m. Monday - Friday. Please note that voicemails left after 4:00 p.m. may not be returned until the following business day.  We are closed weekends and major holidays. You have access to a nurse at all times for urgent questions. Please call the main number to the clinic [phone] and follow the prompts.  For any non-urgent questions, you may also contact your provider using MyChart. We now offer e-Visits for anyone 18 and older to request care online for non-urgent symptoms. For details visit [email or web address].   Also download the MyChart app! Go to the app store, search "MyChart", open the app, select [facility], and log in with your MyChart username and password.   

## 2022-08-21 NOTE — Progress Notes (Signed)
Patients port flushed without difficulty.  Good blood return noted with no bruising or swelling noted at site. Home infusion 5FU pump disconnected with no issues.  VSS with discharge and left in satisfactory condition with no s/s of distress noted.

## 2022-08-24 ENCOUNTER — Other Ambulatory Visit: Payer: Self-pay | Admitting: *Deleted

## 2022-08-24 DIAGNOSIS — F419 Anxiety disorder, unspecified: Secondary | ICD-10-CM

## 2022-08-24 MED ORDER — CLONAZEPAM 1 MG PO TABS: 1.0000 mg | ORAL_TABLET | Freq: Three times a day (TID) | ORAL | 3 refills | Status: DC

## 2022-09-02 ENCOUNTER — Other Ambulatory Visit: Payer: Self-pay

## 2022-09-02 ENCOUNTER — Inpatient Hospital Stay: Payer: Medicaid Other

## 2022-09-02 ENCOUNTER — Inpatient Hospital Stay: Payer: Medicaid Other | Admitting: Hematology

## 2022-09-02 ENCOUNTER — Inpatient Hospital Stay: Payer: Medicaid Other | Attending: Hematology

## 2022-09-02 VITALS — BP 154/79 | HR 79 | Temp 98.3°F | Resp 18

## 2022-09-02 DIAGNOSIS — Z79899 Other long term (current) drug therapy: Secondary | ICD-10-CM | POA: Diagnosis not present

## 2022-09-02 DIAGNOSIS — I10 Essential (primary) hypertension: Secondary | ICD-10-CM | POA: Insufficient documentation

## 2022-09-02 DIAGNOSIS — R7401 Elevation of levels of liver transaminase levels: Secondary | ICD-10-CM | POA: Insufficient documentation

## 2022-09-02 DIAGNOSIS — G35 Multiple sclerosis: Secondary | ICD-10-CM | POA: Insufficient documentation

## 2022-09-02 DIAGNOSIS — C187 Malignant neoplasm of sigmoid colon: Secondary | ICD-10-CM | POA: Diagnosis not present

## 2022-09-02 DIAGNOSIS — Z5112 Encounter for antineoplastic immunotherapy: Secondary | ICD-10-CM | POA: Insufficient documentation

## 2022-09-02 DIAGNOSIS — C787 Secondary malignant neoplasm of liver and intrahepatic bile duct: Secondary | ICD-10-CM

## 2022-09-02 DIAGNOSIS — F419 Anxiety disorder, unspecified: Secondary | ICD-10-CM | POA: Insufficient documentation

## 2022-09-02 DIAGNOSIS — E876 Hypokalemia: Secondary | ICD-10-CM | POA: Insufficient documentation

## 2022-09-02 DIAGNOSIS — Z801 Family history of malignant neoplasm of trachea, bronchus and lung: Secondary | ICD-10-CM | POA: Diagnosis not present

## 2022-09-02 DIAGNOSIS — C189 Malignant neoplasm of colon, unspecified: Secondary | ICD-10-CM

## 2022-09-02 DIAGNOSIS — D509 Iron deficiency anemia, unspecified: Secondary | ICD-10-CM | POA: Insufficient documentation

## 2022-09-02 DIAGNOSIS — D696 Thrombocytopenia, unspecified: Secondary | ICD-10-CM | POA: Insufficient documentation

## 2022-09-02 DIAGNOSIS — Z87891 Personal history of nicotine dependence: Secondary | ICD-10-CM | POA: Insufficient documentation

## 2022-09-02 DIAGNOSIS — G629 Polyneuropathy, unspecified: Secondary | ICD-10-CM | POA: Insufficient documentation

## 2022-09-02 LAB — CBC WITH DIFFERENTIAL/PLATELET
Abs Immature Granulocytes: 0.02 10*3/uL (ref 0.00–0.07)
Basophils Absolute: 0 10*3/uL (ref 0.0–0.1)
Basophils Relative: 1 %
Eosinophils Absolute: 0.1 10*3/uL (ref 0.0–0.5)
Eosinophils Relative: 2 %
HCT: 38.2 % (ref 36.0–46.0)
Hemoglobin: 12.9 g/dL (ref 12.0–15.0)
Immature Granulocytes: 0 %
Lymphocytes Relative: 22 %
Lymphs Abs: 1.4 10*3/uL (ref 0.7–4.0)
MCH: 32.3 pg (ref 26.0–34.0)
MCHC: 33.8 g/dL (ref 30.0–36.0)
MCV: 95.7 fL (ref 80.0–100.0)
Monocytes Absolute: 0.6 10*3/uL (ref 0.1–1.0)
Monocytes Relative: 10 %
Neutro Abs: 4 10*3/uL (ref 1.7–7.7)
Neutrophils Relative %: 65 %
Platelets: 106 10*3/uL — ABNORMAL LOW (ref 150–400)
RBC: 3.99 MIL/uL (ref 3.87–5.11)
RDW: 17.2 % — ABNORMAL HIGH (ref 11.5–15.5)
WBC: 6.1 10*3/uL (ref 4.0–10.5)
nRBC: 0 % (ref 0.0–0.2)

## 2022-09-02 LAB — COMPREHENSIVE METABOLIC PANEL
ALT: 54 U/L — ABNORMAL HIGH (ref 0–44)
AST: 54 U/L — ABNORMAL HIGH (ref 15–41)
Albumin: 3.4 g/dL — ABNORMAL LOW (ref 3.5–5.0)
Alkaline Phosphatase: 110 U/L (ref 38–126)
Anion gap: 10 (ref 5–15)
BUN: 13 mg/dL (ref 8–23)
CO2: 25 mmol/L (ref 22–32)
Calcium: 8.7 mg/dL — ABNORMAL LOW (ref 8.9–10.3)
Chloride: 98 mmol/L (ref 98–111)
Creatinine, Ser: 0.97 mg/dL (ref 0.44–1.00)
GFR, Estimated: >60 mL/min (ref >60)
Glucose, Bld: 145 mg/dL — ABNORMAL HIGH (ref 70–99)
Potassium: 3.2 mmol/L — ABNORMAL LOW (ref 3.5–5.1)
Sodium: 133 mmol/L — ABNORMAL LOW (ref 135–145)
Total Bilirubin: 1.1 mg/dL (ref 0.3–1.2)
Total Protein: 6.9 g/dL (ref 6.5–8.1)

## 2022-09-02 LAB — URINALYSIS, DIPSTICK ONLY
Bilirubin Urine: NEGATIVE
Glucose, UA: NEGATIVE mg/dL
Hgb urine dipstick: NEGATIVE
Ketones, ur: NEGATIVE mg/dL
Leukocytes,Ua: NEGATIVE
Nitrite: NEGATIVE
Protein, ur: 30 mg/dL — AB
Specific Gravity, Urine: 1.016 (ref 1.005–1.030)
pH: 6 (ref 5.0–8.0)

## 2022-09-02 LAB — MAGNESIUM: Magnesium: 1.8 mg/dL (ref 1.7–2.4)

## 2022-09-02 MED ORDER — POTASSIUM CHLORIDE CRYS ER 20 MEQ PO TBCR
40.0000 meq | EXTENDED_RELEASE_TABLET | Freq: Once | ORAL | Status: AC
  Administered 2022-09-02: 40 meq via ORAL
  Filled 2022-09-02: qty 2

## 2022-09-02 MED ORDER — SODIUM CHLORIDE 0.9% FLUSH
10.0000 mL | Freq: Once | INTRAVENOUS | Status: AC
  Administered 2022-09-02: 10 mL via INTRAVENOUS

## 2022-09-02 MED ORDER — PALONOSETRON HCL INJECTION 0.25 MG/5ML
0.2500 mg | Freq: Once | INTRAVENOUS | Status: AC
  Administered 2022-09-02: 0.25 mg via INTRAVENOUS
  Filled 2022-09-02: qty 5

## 2022-09-02 MED ORDER — SODIUM CHLORIDE 0.9 % IV SOLN
5.0000 mg/kg | Freq: Once | INTRAVENOUS | Status: AC
  Administered 2022-09-02: 400 mg via INTRAVENOUS
  Filled 2022-09-02: qty 16

## 2022-09-02 MED ORDER — SODIUM CHLORIDE 0.9 % IV SOLN
3250.0000 mg | INTRAVENOUS | Status: DC
  Administered 2022-09-02: 3250 mg via INTRAVENOUS
  Filled 2022-09-02: qty 65

## 2022-09-02 MED ORDER — SODIUM CHLORIDE 0.9 % IV SOLN: Freq: Once | INTRAVENOUS | Status: AC

## 2022-09-02 MED ORDER — FLUOROURACIL CHEMO INJECTION 500 MG/10ML
270.0000 mg/m2 | Freq: Once | INTRAVENOUS | Status: AC
  Administered 2022-09-02: 500 mg via INTRAVENOUS
  Filled 2022-09-02: qty 10

## 2022-09-02 MED ORDER — SODIUM CHLORIDE 0.9 % IV SOLN
320.0000 mg/m2 | Freq: Once | INTRAVENOUS | Status: AC
  Administered 2022-09-02: 628 mg via INTRAVENOUS
  Filled 2022-09-02: qty 31.4

## 2022-09-02 MED ORDER — SODIUM CHLORIDE 0.9 % IV SOLN
10.0000 mg | Freq: Once | INTRAVENOUS | Status: AC
  Administered 2022-09-02: 10 mg via INTRAVENOUS
  Filled 2022-09-02: qty 10

## 2022-09-02 NOTE — Progress Notes (Signed)
Patient presents today for chemotherapy infusion.  Patient is in satisfactory condition with no new complaints voiced.  Vital signs are stable.  Labs reviewed.  All labs are within treatment parameters.  Potassium today is 3.2.  We will give Klor Con 40 mEq PO x one dose today per standing orders by Dr. Delton Coombes.  We will proceed with treatment per MD orders.   Patient tolerated treatment well with no complaints voiced.  Home infusion 5FU pump connected with no issues.  Patient left ambulatory in stable condition.  Vital signs stable at discharge.  Follow up as scheduled.

## 2022-09-02 NOTE — Patient Instructions (Signed)
[facility]  Discharge Instructions: Thank you for choosing [facility] to provide your oncology and hematology care.  If you have a lab appointment with the [facility], please come in thru the Main Entrance and check in at the main information desk.  Wear comfortable clothing and clothing appropriate for easy access to any Portacath or PICC line.   We strive to give you quality time with your provider. You may need to reschedule your appointment if you arrive late (15 or more minutes).  Arriving late affects you and other patients whose appointments are after yours.  Also, if you miss three or more appointments without notifying the office, you may be dismissed from the clinic at the provider's discretion.      For prescription refill requests, have your pharmacy contact our office and allow 72 hours for refills to be completed.    Today you received the following chemotherapy and/or immunotherapy agents Vegzelma/Leucovorin/5FU.  Leucovorin Injection What is this medication? LEUCOVORIN (loo koe VOR in) prevents side effects from certain medications, such as methotrexate. It works by increasing folate levels. This helps protect healthy cells in your body. It may also be used to treat anemia caused by low levels of folate. It can also be used with fluorouracil, a type of chemotherapy, to treat colorectal cancer. It works by increasing the effects of fluorouracil in the body. This medicine may be used for other purposes; ask your health care provider or pharmacist if you have questions. What should I tell my care team before I take this medication? They need to know if you have any of these conditions: Anemia from low levels of vitamin B12 in the blood An unusual or allergic reaction to leucovorin, folic acid, other medications, foods, dyes, or preservatives Pregnant or trying to get pregnant Breastfeeding How should I use this medication? This medication  is injected into a vein or a muscle. It is given by your care team in a hospital or clinic setting. Talk to your care team about the use of this medication in children. Special care may be needed. Overdosage: If you think you have taken too much of this medicine contact a poison control center or emergency room at once. NOTE: This medicine is only for you. Do not share this medicine with others. What if I miss a dose? Keep appointments for follow-up doses. It is important not to miss your dose. Call your care team if you are unable to keep an appointment. What may interact with this medication? Capecitabine Fluorouracil Phenobarbital Phenytoin Primidone Trimethoprim;sulfamethoxazole This list may not describe all possible interactions. Give your health care provider a list of all the medicines, herbs, non-prescription drugs, or dietary supplements you use. Also tell them if you smoke, drink alcohol, or use illegal drugs. Some items may interact with your medicine. What should I watch for while using this medication? Your condition will be monitored carefully while you are receiving this medication. This medication may increase the side effects of 5-fluorouracil. Tell your care team if you have diarrhea or mouth sores that do not get better or that get worse. What side effects may I notice from receiving this medication? Side effects that you should report to your care team as soon as possible: Allergic reactions--skin rash, itching, hives, swelling of the face, lips, tongue, or throat This list may not describe all possible side effects. Call your doctor for medical advice about side effects. You may report side effects to FDA  at 1-800-FDA-1088. Where should I keep my medication? This medication is given in a hospital or clinic. It will not be stored at home. NOTE: This sheet is a summary. It may not cover all possible information. If you have questions about this medicine, talk to your  doctor, pharmacist, or health care provider.  2023 Elsevier/Gold Standard (2021-10-24 00:00:00)    Fluorouracil Injection What is this medication? FLUOROURACIL (flure oh YOOR a sil) treats some types of cancer. It works by slowing down the growth of cancer cells. This medicine may be used for other purposes; ask your health care provider or pharmacist if you have questions. COMMON BRAND NAME(S): Adrucil What should I tell my care team before I take this medication? They need to know if you have any of these conditions: Blood disorders Dihydropyrimidine dehydrogenase (DPD) deficiency Infection, such as chickenpox, cold sores, herpes Kidney disease Liver disease Poor nutrition Recent or ongoing radiation therapy An unusual or allergic reaction to fluorouracil, other medications, foods, dyes, or preservatives If you or your partner are pregnant or trying to get pregnant Breast-feeding How should I use this medication? This medication is injected into a vein. It is administered by your care team in a hospital or clinic setting. Talk to your care team about the use of this medication in children. Special care may be needed. Overdosage: If you think you have taken too much of this medicine contact a poison control center or emergency room at once. NOTE: This medicine is only for you. Do not share this medicine with others. What if I miss a dose? Keep appointments for follow-up doses. It is important not to miss your dose. Call your care team if you are unable to keep an appointment. What may interact with this medication? Do not take this medication with any of the following: Live virus vaccines This medication may also interact with the following: Medications that treat or prevent blood clots, such as warfarin, enoxaparin, dalteparin This list may not describe all possible interactions. Give your health care provider a list of all the medicines, herbs, non-prescription drugs, or dietary  supplements you use. Also tell them if you smoke, drink alcohol, or use illegal drugs. Some items may interact with your medicine. What should I watch for while using this medication? Your condition will be monitored carefully while you are receiving this medication. This medication may make you feel generally unwell. This is not uncommon as chemotherapy can affect healthy cells as well as cancer cells. Report any side effects. Continue your course of treatment even though you feel ill unless your care team tells you to stop. In some cases, you may be given additional medications to help with side effects. Follow all directions for their use. This medication may increase your risk of getting an infection. Call your care team for advice if you get a fever, chills, sore throat, or other symptoms of a cold or flu. Do not treat yourself. Try to avoid being around people who are sick. This medication may increase your risk to bruise or bleed. Call your care team if you notice any unusual bleeding. Be careful brushing or flossing your teeth or using a toothpick because you may get an infection or bleed more easily. If you have any dental work done, tell your dentist you are receiving this medication. Avoid taking medications that contain aspirin, acetaminophen, ibuprofen, naproxen, or ketoprofen unless instructed by your care team. These medications may hide a fever. Do not treat diarrhea with over the  counter products. Contact your care team if you have diarrhea that lasts more than 2 days or if it is severe and watery. This medication can make you more sensitive to the sun. Keep out of the sun. If you cannot avoid being in the sun, wear protective clothing and sunscreen. Do not use sun lamps, tanning beds, or tanning booths. Talk to your care team if you or your partner wish to become pregnant or think you might be pregnant. This medication can cause serious birth defects if taken during pregnancy and for 3  months after the last dose. A reliable form of contraception is recommended while taking this medication and for 3 months after the last dose. Talk to your care team about effective forms of contraception. Do not father a child while taking this medication and for 3 months after the last dose. Use a condom while having sex during this time period. Do not breastfeed while taking this medication. This medication may cause infertility. Talk to your care team if you are concerned about your fertility. What side effects may I notice from receiving this medication? Side effects that you should report to your care team as soon as possible: Allergic reactions--skin rash, itching, hives, swelling of the face, lips, tongue, or throat Heart attack--pain or tightness in the chest, shoulders, arms, or jaw, nausea, shortness of breath, cold or clammy skin, feeling faint or lightheaded Heart failure--shortness of breath, swelling of the ankles, feet, or hands, sudden weight gain, unusual weakness or fatigue Heart rhythm changes--fast or irregular heartbeat, dizziness, feeling faint or lightheaded, chest pain, trouble breathing High ammonia level--unusual weakness or fatigue, confusion, loss of appetite, nausea, vomiting, seizures Infection--fever, chills, cough, sore throat, wounds that don't heal, pain or trouble when passing urine, general feeling of discomfort or being unwell Low red blood cell level--unusual weakness or fatigue, dizziness, headache, trouble breathing Pain, tingling, or numbness in the hands or feet, muscle weakness, change in vision, confusion or trouble speaking, loss of balance or coordination, trouble walking, seizures Redness, swelling, and blistering of the skin over hands and feet Severe or prolonged diarrhea Unusual bruising or bleeding Side effects that usually do not require medical attention (report to your care team if they continue or are bothersome): Dry skin Headache Increased  tears Nausea Pain, redness, or swelling with sores inside the mouth or throat Sensitivity to light Vomiting This list may not describe all possible side effects. Call your doctor for medical advice about side effects. You may report side effects to FDA at 1-800-FDA-1088. Where should I keep my medication? This medication is given in a hospital or clinic. It will not be stored at home. NOTE: This sheet is a summary. It may not cover all possible information. If you have questions about this medicine, talk to your doctor, pharmacist, or health care provider.  2023 Elsevier/Gold Standard (2021-10-14 00:00:00)        To help prevent nausea and vomiting after your treatment, we encourage you to take your nausea medication as directed.  BELOW ARE SYMPTOMS THAT SHOULD BE REPORTED IMMEDIATELY: *FEVER GREATER THAN 100.4 F (38 C) OR HIGHER *CHILLS OR SWEATING *NAUSEA AND VOMITING THAT IS NOT CONTROLLED WITH YOUR NAUSEA MEDICATION *UNUSUAL SHORTNESS OF BREATH *UNUSUAL BRUISING OR BLEEDING *URINARY PROBLEMS (pain or burning when urinating, or frequent urination) *BOWEL PROBLEMS (unusual diarrhea, constipation, pain near the anus) TENDERNESS IN MOUTH AND THROAT WITH OR WITHOUT PRESENCE OF ULCERS (sore throat, sores in mouth, or a toothache) UNUSUAL RASH, SWELLING OR  PAIN  UNUSUAL VAGINAL DISCHARGE OR ITCHING   Items with * indicate a potential emergency and should be followed up as soon as possible or go to the Emergency Department if any problems should occur.  Please show the CHEMOTHERAPY ALERT CARD or IMMUNOTHERAPY ALERT CARD at check-in to the Emergency Department and triage nurse.  Should you have questions after your visit or need to cancel or reschedule your appointment, please contact [facility] [phone]  and follow the prompts.  Office hours are 8:00 a.m. to 4:30 p.m. Monday - Friday. Please note that voicemails left after 4:00 p.m. may not be returned until  the following business day.  We are closed weekends and major holidays. You have access to a nurse at all times for urgent questions. Please call the main number to the clinic [phone] and follow the prompts.  For any non-urgent questions, you may also contact your provider using MyChart. We now offer e-Visits for anyone 93 and older to request care online for non-urgent symptoms. For details visit [email or web address].   Also download the MyChart app! Go to the app store, search "MyChart", open the app, select [facility], and log in with your MyChart username and password.

## 2022-09-04 ENCOUNTER — Inpatient Hospital Stay: Payer: Medicaid Other

## 2022-09-04 VITALS — BP 148/77 | HR 77 | Temp 97.9°F

## 2022-09-04 DIAGNOSIS — C189 Malignant neoplasm of colon, unspecified: Secondary | ICD-10-CM

## 2022-09-04 DIAGNOSIS — Z5112 Encounter for antineoplastic immunotherapy: Secondary | ICD-10-CM | POA: Diagnosis not present

## 2022-09-04 MED ORDER — HEPARIN SOD (PORK) LOCK FLUSH 100 UNIT/ML IV SOLN
500.0000 [IU] | Freq: Once | INTRAVENOUS | Status: AC | PRN
  Administered 2022-09-04: 500 [IU]

## 2022-09-04 MED ORDER — SODIUM CHLORIDE 0.9% FLUSH
10.0000 mL | INTRAVENOUS | Status: DC | PRN
  Administered 2022-09-04: 10 mL

## 2022-09-04 NOTE — Progress Notes (Signed)
Susan Martin presents to have home infusion pump d/c'd and for port-a-cath deaccess with flush.  Portacath located right chest wall accessed with  H 20 needle.  Good blood return present. Portacath flushed with NS and 500U/5ml Heparin, and needle removed intact.  Procedure tolerated well and without incident.  

## 2022-09-15 NOTE — Progress Notes (Signed)
[facility] [address] [place] [address]    Clinic Day:  09/16/2022  Referring physician: Derek Jack, MD  Patient Care Team: Patient, No Pcp Per as PCP - General (General Practice) Brien Mates, RN as Oncology Nurse Navigator (Oncology)   ASSESSMENT & PLAN:   Assessment: 1.  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 -CT chest on 05/02/2020 with no evidence of pulmonary metastatic disease. -CTAP on 05/07/2020 with multiple lesions throughout the liver, largest in the right lobe measuring 3.9 cm, lateral segment left lobe measuring 2.6 cm and inferior right lobe confluent lesion 4.2 x 3.7 cm.  No lymphadenopathy. -CEA on 05/02/2020-60.2. -FOLFOX and bevacizumab started on 06/04/2020. 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   2.  Iron deficiency anemia: -Feraheme on 04/30/2020 and 05/06/2020.   3.  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 -Smoked for 3 to 4 years and quit. -Mother had cancer, type unknown.  Maternal uncle had lung cancer.  Maternal aunt had lung cancer.  Plan: 1.  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  No new metastatic disease. - Last CEA was 4.3 on 08/19/2022. - Labs today shows mildly elevated AST and rest of LFTs normal.  CBC was grossly normal with mild thrombocytopenia.  UA was negative for protein. - Proceed with maintenance 5-FU and leucovorin.  RTC 8 weeks for follow-up.  Plan to repeat CEA and CT CAP.   2.  Hypertension: - Continue triamterene/HCTZ and Norvasc 5 mg daily.  Blood pressure is 150/77.   3.  Severe hypokalemia: - Continue potassium  10 mEq 2 tabs daily.   4.  Peripheral neuropathy: - Continue gabapentin 100 mg 3 times daily.  Well-controlled.   5.  Anxiety: - Continue Klonopin 1 mg 3 times daily and Paxil 40 mg daily.  6.  Mucositis: - Continue Vaseline and lidocaine gel as needed.  Orders Placed This Encounter  Procedures   CT CHEST ABDOMEN PELVIS W CONTRAST    Standing Status:   Future    Standing Expiration Date:   09/16/2023    Order Specific Question:   If indicated for the ordered procedure, I authorize the administration of contrast media per Radiology protocol    Answer:   Yes    Order Specific Question:   Does the patient have a contrast media/X-ray dye allergy?    Answer:   No    Order Specific Question:   Preferred imaging location?    Answer:   [facility]    Order Specific Question:   Is Oral Contrast requested for this exam?    Answer:   Yes, Per Radiology protocol      I,Katie Daubenspeck,acting as a scribe for Derek Jack, MD.,have documented all relevant documentation on the behalf of Derek Jack, MD,as directed by  Derek Jack, MD while in the presence of Derek Jack, MD.   I, Derek Jack MD, have reviewed the above documentation for accuracy and completeness, and I agree with the above.   Derek Jack, MD   3/20/20245:02 PM  CHIEF COMPLAINT:   Diagnosis: metastatic colon cancer to liver    Cancer Staging  No matching staging information was found for the patient.   Prior  Therapy: Sigmoid colectomy and end colostomy on 04/29/2020   Current Therapy:  Maintenance 5-FU and bevacizumab    HISTORY OF PRESENT ILLNESS:   Oncology History  Metastatic colon cancer to liver ([place])  05/16/2020 Initial Diagnosis   Metastatic colon cancer to liver ([place])   06/03/2020 Genetic Testing   Foundation One:     06/04/2020 - 02/25/2022 Chemotherapy   Patient is on Treatment Plan : COLORECTAL FOLFOX + Bevacizumab q14d     06/04/2020 -   Chemotherapy   Patient is on Treatment Plan : COLORECTAL FOLFOX + Bevacizumab q14d        INTERVAL HISTORY:   Susan Martin is a 62 y.o. female presenting to clinic today for follow up of metastatic colon cancer to liver. She was last seen by me on 08/19/22.  Today, she states that she is doing well overall. Her appetite level is at 75%. Her energy level is at 50%. She denies any fevers or infections or new onset pains.   PAST MEDICAL HISTORY:   Past Medical History: Past Medical History:  Diagnosis Date   Adenocarcinoma of colon metastatic to liver [facility]) onocology--- dr s. Delton Coombes   04-29-2020 [facility] surgery for perforated colon s/p sigmoid colectomy w/ colostomy; dx  Stage IV colon cancer mets to liver   Anxiety    Depression    Hypertension    followed by dr Glo Herring and oncology  (05-27-2020 per pt does not have pcp yet)   IDA (iron deficiency anemia)    Pulmonary nodule, right     Surgical History: Past Surgical History:  Procedure Laterality Date   ABDOMINAL HYSTERECTOMY  03-31-2016   @[facility]    W/  BILATERAL SALPINOOPHORECTOMY    APPLICATION OF WOUND VAC N/A 04/29/2020   Procedure: APPLICATION OF WOUND VAC;  Surgeon: Mickeal Skinner, MD;  Location: [facility];  Service: General;  Laterality: N/A;   ENDOMETRIAL ABLATION     LAPAROTOMY N/A 04/29/2020   Procedure: EXPLORATORY LAPAROTOMY;  Surgeon: Mickeal Skinner, MD;  Location: [facility];  Service: General;  Laterality: N/A;   PARTIAL COLECTOMY N/A 04/29/2020   Procedure: PARTIAL COLECTOMY WITH END COLOSTOMY;  Surgeon: Kieth Brightly Arta Bruce, MD;  Location: [facility];  Service: General;  Laterality: N/A;   PORTACATH PLACEMENT Right 05/28/2020   Procedure: INSERTION PORT-A-CATH;  Surgeon: Kinsinger, Arta Bruce, MD;  Location: [facility];  Service: General;  Laterality: Right;   SALPINGOOPHORECTOMY Left 03/31/2016   Procedure: LEFT SALPINGO OOPHORECTOMY WITH FROZEN SECTION,  ABDOMINAL HYSTERECTOMY WITH BILATERAL  SALPINGO-OOPHORECTOMY;  Surgeon: Jonnie Kind, MD;  Location: [facility];  Service: Gynecology;  Laterality: Left;    Social History: Social History   Socioeconomic History   Marital status: Divorced    Spouse name: Not on file   Number of children: Not on file   Years of education: Not on file   Highest education level: Not on file  Occupational History   Not on file  Tobacco Use   Smoking status: Former    Packs/day: 0.50    Years: 5.00    Additional pack years: 0.00    Total pack years: 2.50    Types: Cigarettes    Quit date: 03/28/2015    Years since quitting: 7.4   Smokeless tobacco: Never  Vaping Use   Vaping Use: Never used  Substance and Sexual Activity   Alcohol use: No   Drug use: Never   Sexual activity: Yes    Partners: Male    Birth control/protection: Surgical  Other Topics Concern   Not on file  Social History Narrative   Not on file   Social Determinants of Health   Financial Resource Strain: Low Risk  (11/01/2019)   Overall Financial Resource Strain (CARDIA)    Difficulty of Paying Living Expenses: Not very hard  Food Insecurity: No Food Insecurity (11/01/2019)   Hunger Vital Sign    Worried About Running Out of Food in the Last Year: Never true    Ran Out of Food in the Last Year: Never true  Transportation Needs: No Transportation Needs (11/01/2019)   PRAPARE - Hydrologist (Medical): No    Lack of Transportation (Non-Medical): No  Physical Activity: Insufficiently Active (11/01/2019)   Exercise Vital Sign    Days of Exercise per Week: 2 days    Minutes of Exercise per Session: 30 min  Stress: Stress Concern Present (11/01/2019)   [place]    Feeling of Stress : To some extent  Social Connections: Moderately Integrated (11/01/2019)   Social Connection and Isolation Panel [NHANES]    Frequency of Communication with Friends and Family: More than three times a  week    Frequency of Social Gatherings with Friends and Family: Once a week    Attends Religious Services: More than 4 times per year    Active Member of [employer] or Organizations: No    Attends Archivist Meetings: Never    Marital Status: Living with partner  Intimate Partner Violence: Not At Risk (11/01/2019)   Humiliation, Afraid, Rape, and Kick questionnaire    Fear of Current or Ex-Partner: No    Emotionally Abused: No    Physically Abused: No    Sexually Abused: No    Family History: Family History  Problem Relation Age of Onset   Hypertension Mother    Diabetes Mother    Cirrhosis Father     Current Medications:  Current Outpatient Medications:    amLODipine (NORVASC) 5 MG tablet, Take 1 tablet (5 mg total) by mouth daily., Disp: 90 tablet, Rfl: 3   BEVACIZUMAB IV, Inject 5 mg/kg into the vein every 14 (fourteen) days., Disp: , Rfl:    clonazePAM (KLONOPIN) 1 MG tablet, Take 1 tablet (1 mg total) by mouth in the morning, at noon, and at bedtime., Disp: 90 tablet, Rfl: 3   fluorouracil CALGB 09811 in sodium chloride 0.9 % 150 mL, Inject 2,400 mg/m2 into the vein over 48 hr., Disp: , Rfl:    gabapentin (NEURONTIN) 100 MG capsule, TAKE 1 CAPSULE (100 MG TOTAL) BY MOUTH THREE TIMES DAILY., Disp: 90 capsule, Rfl: 3   LEUCOVORIN CALCIUM IV, Inject 400 mg/m2 into the vein every 21 ( twenty-one) days., Disp: , Rfl:    lidocaine (XYLOCAINE) 2 % solution, Use as directed 15 mLs in the mouth or throat every 6 (six) hours as needed for mouth pain. Swish and spit/swallow every six hours as needed for mouth pain, Disp: 480 mL, Rfl: 3   nystatin (MYCOSTATIN) 100000 UNIT/ML suspension, Take by mouth., Disp: , Rfl:    PARoxetine (PAXIL) 40 MG tablet, Take 1 tablet (40 mg total) by mouth every morning., Disp: 90 tablet, Rfl: 3   polyethylene glycol (MIRALAX / GLYCOLAX) packet, Take 17 g by mouth every other day., Disp: , Rfl:    potassium chloride (KLOR-CON M10) 10 MEQ tablet, Take 2  tablets (20 mEq total) by mouth daily., Disp: 180 tablet, Rfl: 3  triamterene-hydrochlorothiazide (MAXZIDE-25) 37.5-25 MG tablet, Take 1 tablet by mouth daily., Disp: 90 tablet, Rfl: 3 No current facility-administered medications for this visit.  Facility-Administered Medications Ordered in Other Visits:    dextrose 5 % solution, , Intravenous, Once, Derek Jack, MD   fluorouracil (ADRUCIL) 3,250 mg in sodium chloride 0.9 % 85 mL chemo infusion, 3,250 mg, Intravenous, 1 day or 1 dose, Derek Jack, MD, Infusion Verify at 09/16/22 1505   Allergies: No Known Allergies  REVIEW OF SYSTEMS:   Review of Systems  Constitutional:  Negative for chills, fatigue and fever.  HENT:   Negative for lump/mass, mouth sores, nosebleeds, sore throat and trouble swallowing.   Eyes:  Negative for eye problems.  Respiratory:  Negative for cough and shortness of breath.   Cardiovascular:  Negative for chest pain, leg swelling and palpitations.  Gastrointestinal:  Negative for abdominal pain, constipation, diarrhea, nausea and vomiting.  Genitourinary:  Negative for bladder incontinence, difficulty urinating, dysuria, frequency, hematuria and nocturia.   Musculoskeletal:  Negative for arthralgias, back pain, flank pain, myalgias and neck pain.  Skin:  Negative for itching and rash.  Neurological:  Positive for numbness. Negative for dizziness and headaches.  Hematological:  Does not bruise/bleed easily.  Psychiatric/Behavioral:  Positive for depression and sleep disturbance. Negative for suicidal ideas. The patient is nervous/anxious.   All other systems reviewed and are negative.    VITALS:   There were no vitals taken for this visit.  Wt Readings from Last 3 Encounters:  09/16/22 185 lb (83.9 kg)  09/02/22 185 lb (83.9 kg)  08/19/22 181 lb (82.1 kg)    There is no height or weight on file to calculate BMI.  Performance status (ECOG): 1 - Symptomatic but completely  ambulatory  PHYSICAL EXAM:   Physical Exam Vitals and nursing note reviewed. Exam conducted with a chaperone present.  Constitutional:      Appearance: Normal appearance.  Cardiovascular:     Rate and Rhythm: Normal rate and regular rhythm.     Pulses: Normal pulses.     Heart sounds: Normal heart sounds.  Pulmonary:     Effort: Pulmonary effort is normal.     Breath sounds: Normal breath sounds.  Abdominal:     Palpations: Abdomen is soft. There is no hepatomegaly, splenomegaly or mass.     Tenderness: There is no abdominal tenderness.  Musculoskeletal:     Right lower leg: No edema.     Left lower leg: No edema.  Lymphadenopathy:     Cervical: No cervical adenopathy.     Right cervical: No superficial, deep or posterior cervical adenopathy.    Left cervical: No superficial, deep or posterior cervical adenopathy.     Upper Body:     Right upper body: No supraclavicular or axillary adenopathy.     Left upper body: No supraclavicular or axillary adenopathy.  Neurological:     General: No focal deficit present.     Mental Status: She is alert and oriented to person, place, and time.  Psychiatric:        Mood and Affect: Mood normal.        Behavior: Behavior normal.     LABS:      Latest Ref Rng & Units 09/16/2022    9:19 AM 09/02/2022    9:25 AM 08/19/2022    8:29 AM  CBC  WBC 4.0 - 10.5 K/uL 6.6  6.1  3.6   Hemoglobin 12.0 - 15.0 g/dL 13.3  12.9  13.4  Hematocrit 36.0 - 46.0 % 39.4  38.2  40.1   Platelets 150 - 400 K/uL 116  106  107       Latest Ref Rng & Units 09/16/2022    9:19 AM 09/02/2022    9:25 AM 08/19/2022    8:29 AM  CMP  Glucose 70 - 99 mg/dL 193  145  121   BUN 8 - 23 mg/dL 12  13  10    Creatinine 0.44 - 1.00 mg/dL 0.87  0.97  0.87   Sodium 135 - 145 mmol/L 132  133  134   Potassium 3.5 - 5.1 mmol/L 3.2  3.2  3.6   Chloride 98 - 111 mmol/L 98  98  99   CO2 22 - 32 mmol/L 24  25  24    Calcium 8.9 - 10.3 mg/dL 8.4  8.7  8.6   Total Protein 6.5 - 8.1  g/dL 7.0  6.9  6.7   Total Bilirubin 0.3 - 1.2 mg/dL 1.0  1.1  0.9   Alkaline Phos 38 - 126 U/L 103  110  107   AST 15 - 41 U/L 46  54  46   ALT 0 - 44 U/L 39  54  31      Lab Results  Component Value Date   CEA1 4.3 08/19/2022   /  CEA  Date Value Ref Range Status  08/19/2022 4.3 0.0 - 4.7 ng/mL Final    Comment:    (NOTE)                             Nonsmokers          <3.9                             Smokers             <5.6 Roche [employer]ostics Electrochemiluminescence Immunoassay (ECLIA) Values obtained with different assay methods or kits cannot be used interchangeably.  Results cannot be interpreted as absolute evidence of the presence or absence of malignant disease. Performed At: [facility] [place], [place] [id number] Rush Farmer MD [id number]    No results found for: "PSA1" No results found for: "CAN199" No results found for: "CAN125"  No results found for: "TOTALPROTELP", "ALBUMINELP", "A1GS", "A2GS", "BETS", "BETA2SER", "GAMS", "MSPIKE", "SPEI" Lab Results  Component Value Date   TIBC 248 (L) 04/29/2020   FERRITIN 57 04/29/2020   IRONPCTSAT 3 (L) 04/29/2020   No results found for: "LDH"   STUDIES:   No results found.

## 2022-09-16 ENCOUNTER — Inpatient Hospital Stay (HOSPITAL_BASED_OUTPATIENT_CLINIC_OR_DEPARTMENT_OTHER): Payer: Medicaid Other | Admitting: Hematology

## 2022-09-16 ENCOUNTER — Encounter: Payer: Self-pay | Admitting: Hematology

## 2022-09-16 ENCOUNTER — Inpatient Hospital Stay: Payer: Medicaid Other

## 2022-09-16 VITALS — BP 136/83 | HR 76 | Temp 97.4°F | Resp 18 | Wt 185.0 lb

## 2022-09-16 DIAGNOSIS — C189 Malignant neoplasm of colon, unspecified: Secondary | ICD-10-CM

## 2022-09-16 DIAGNOSIS — Z5112 Encounter for antineoplastic immunotherapy: Secondary | ICD-10-CM | POA: Diagnosis not present

## 2022-09-16 DIAGNOSIS — C787 Secondary malignant neoplasm of liver and intrahepatic bile duct: Secondary | ICD-10-CM

## 2022-09-16 LAB — COMPREHENSIVE METABOLIC PANEL
ALT: 39 U/L (ref 0–44)
AST: 46 U/L — ABNORMAL HIGH (ref 15–41)
Albumin: 3.4 g/dL — ABNORMAL LOW (ref 3.5–5.0)
Alkaline Phosphatase: 103 U/L (ref 38–126)
Anion gap: 10 (ref 5–15)
BUN: 12 mg/dL (ref 8–23)
CO2: 24 mmol/L (ref 22–32)
Calcium: 8.4 mg/dL — ABNORMAL LOW (ref 8.9–10.3)
Chloride: 98 mmol/L (ref 98–111)
Creatinine, Ser: 0.87 mg/dL (ref 0.44–1.00)
GFR, Estimated: >60 mL/min (ref >60)
Glucose, Bld: 193 mg/dL — ABNORMAL HIGH (ref 70–99)
Potassium: 3.2 mmol/L — ABNORMAL LOW (ref 3.5–5.1)
Sodium: 132 mmol/L — ABNORMAL LOW (ref 135–145)
Total Bilirubin: 1 mg/dL (ref 0.3–1.2)
Total Protein: 7 g/dL (ref 6.5–8.1)

## 2022-09-16 LAB — CBC WITH DIFFERENTIAL/PLATELET
Abs Immature Granulocytes: 0.02 10*3/uL (ref 0.00–0.07)
Basophils Absolute: 0 10*3/uL (ref 0.0–0.1)
Basophils Relative: 1 %
Eosinophils Absolute: 0.2 10*3/uL (ref 0.0–0.5)
Eosinophils Relative: 3 %
HCT: 39.4 % (ref 36.0–46.0)
Hemoglobin: 13.3 g/dL (ref 12.0–15.0)
Immature Granulocytes: 0 %
Lymphocytes Relative: 21 %
Lymphs Abs: 1.4 10*3/uL (ref 0.7–4.0)
MCH: 32.5 pg (ref 26.0–34.0)
MCHC: 33.8 g/dL (ref 30.0–36.0)
MCV: 96.3 fL (ref 80.0–100.0)
Monocytes Absolute: 0.6 10*3/uL (ref 0.1–1.0)
Monocytes Relative: 9 %
Neutro Abs: 4.4 10*3/uL (ref 1.7–7.7)
Neutrophils Relative %: 66 %
Platelets: 116 10*3/uL — ABNORMAL LOW (ref 150–400)
RBC: 4.09 MIL/uL (ref 3.87–5.11)
RDW: 16.7 % — ABNORMAL HIGH (ref 11.5–15.5)
WBC: 6.6 10*3/uL (ref 4.0–10.5)
nRBC: 0 % (ref 0.0–0.2)

## 2022-09-16 LAB — MAGNESIUM: Magnesium: 1.8 mg/dL (ref 1.7–2.4)

## 2022-09-16 MED ORDER — SODIUM CHLORIDE 0.9 % IV SOLN: Freq: Once | INTRAVENOUS | Status: AC

## 2022-09-16 MED ORDER — SODIUM CHLORIDE 0.9 % IV SOLN
10.0000 mg | Freq: Once | INTRAVENOUS | Status: AC
  Administered 2022-09-16: 10 mg via INTRAVENOUS
  Filled 2022-09-16: qty 10

## 2022-09-16 MED ORDER — DEXTROSE 5 % IV SOLN: Freq: Once | INTRAVENOUS | Status: DC

## 2022-09-16 MED ORDER — FLUOROURACIL CHEMO INJECTION 500 MG/10ML
270.0000 mg/m2 | Freq: Once | INTRAVENOUS | Status: AC
  Administered 2022-09-16: 500 mg via INTRAVENOUS
  Filled 2022-09-16: qty 10

## 2022-09-16 MED ORDER — SODIUM CHLORIDE 0.9 % IV SOLN
3250.0000 mg | INTRAVENOUS | Status: DC
  Administered 2022-09-16: 3250 mg via INTRAVENOUS
  Filled 2022-09-16: qty 65

## 2022-09-16 MED ORDER — SODIUM CHLORIDE 0.9% FLUSH
10.0000 mL | Freq: Once | INTRAVENOUS | Status: AC
  Administered 2022-09-16: 10 mL via INTRAVENOUS

## 2022-09-16 MED ORDER — SODIUM CHLORIDE 0.9 % IV SOLN
5.0000 mg/kg | Freq: Once | INTRAVENOUS | Status: AC
  Administered 2022-09-16: 400 mg via INTRAVENOUS
  Filled 2022-09-16: qty 16

## 2022-09-16 MED ORDER — PALONOSETRON HCL INJECTION 0.25 MG/5ML
0.2500 mg | Freq: Once | INTRAVENOUS | Status: AC
  Administered 2022-09-16: 0.25 mg via INTRAVENOUS
  Filled 2022-09-16: qty 5

## 2022-09-16 MED ORDER — SODIUM CHLORIDE 0.9 % IV SOLN
320.0000 mg/m2 | Freq: Once | INTRAVENOUS | Status: AC
  Administered 2022-09-16: 628 mg via INTRAVENOUS
  Filled 2022-09-16: qty 31.4

## 2022-09-16 NOTE — Progress Notes (Signed)
Labs reviewed with MD today at office visit, ok to treat.    Treatment given per orders. Patient tolerated it well without problems. Vitals stable and discharged home from clinic ambulatory. Follow up as scheduled.

## 2022-09-16 NOTE — Progress Notes (Signed)
Patients port flushed without difficulty.  Good blood return noted with no bruising or swelling noted at site.  Stable during access and blood draw.  Patient to remain accessed for treatment. 

## 2022-09-16 NOTE — Patient Instructions (Signed)
[facility] at [facility] Discharge Instructions   You were seen and examined today by Dr. Katragadda.  He reviewed the results of your lab work which are normal/stable.   We will proceed with your treatment today.  Return as scheduled.    Thank you for choosing [facility] at [facility] to provide your oncology and hematology care.  To afford each patient quality time with our provider, please arrive at least 15 minutes before your scheduled appointment time.   If you have a lab appointment with the Cancer Center please come in thru the Main Entrance and check in at the main information desk.  You need to re-schedule your appointment should you arrive 10 or more minutes late.  We strive to give you quality time with our providers, and arriving late affects you and other patients whose appointments are after yours.  Also, if you no show three or more times for appointments you may be dismissed from the clinic at the providers discretion.     Again, thank you for choosing [facility].  Our hope is that these requests will decrease the amount of time that you wait before being seen by our physicians.       _____________________________________________________________  Should you have questions after your visit to [facility], please contact our office at [phone] and follow the prompts.  Our office hours are 8:00 a.m. and 4:30 p.m. Monday - Friday.  Please note that voicemails left after 4:00 p.m. may not be returned until the following business day.  We are closed weekends and major holidays.  You do have access to a nurse 24-7, just call the main number to the clinic [phone] and do not press any options, hold on the line and a nurse will answer the phone.    For prescription refill requests, have your pharmacy contact our office and allow 72 hours.    Due to Covid, you will need to wear a mask upon entering  the hospital. If you do not have a mask, a mask will be given to you at the Main Entrance upon arrival. For doctor visits, patients may have 1 support person age 18 or older with them. For treatment visits, patients can not have anyone with them due to social distancing guidelines and our immunocompromised population.      

## 2022-09-16 NOTE — Patient Instructions (Addendum)
[facility]  Discharge Instructions: Thank you for choosing [facility] to provide your oncology and hematology care.  If you have a lab appointment with the [facility], please come in thru the Main Entrance and check in at the main information desk.  Wear comfortable clothing and clothing appropriate for easy access to any Portacath or PICC line.   We strive to give you quality time with your provider. You may need to reschedule your appointment if you arrive late (15 or more minutes).  Arriving late affects you and other patients whose appointments are after yours.  Also, if you miss three or more appointments without notifying the office, you may be dismissed from the clinic at the provider's discretion.      For prescription refill requests, have your pharmacy contact our office and allow 72 hours for refills to be completed.    Today you received the following chemotherapy and/or immunotherapy bevacizumab, leucovorin, fluorouracil   To help prevent nausea and vomiting after your treatment, we encourage you to take your nausea medication as directed.  BELOW ARE SYMPTOMS THAT SHOULD BE REPORTED IMMEDIATELY: *FEVER GREATER THAN 100.4 F (38 C) OR HIGHER *CHILLS OR SWEATING *NAUSEA AND VOMITING THAT IS NOT CONTROLLED WITH YOUR NAUSEA MEDICATION *UNUSUAL SHORTNESS OF BREATH *UNUSUAL BRUISING OR BLEEDING *URINARY PROBLEMS (pain or burning when urinating, or frequent urination) *BOWEL PROBLEMS (unusual diarrhea, constipation, pain near the anus) TENDERNESS IN MOUTH AND THROAT WITH OR WITHOUT PRESENCE OF ULCERS (sore throat, sores in mouth, or a toothache) UNUSUAL RASH, SWELLING OR PAIN  UNUSUAL VAGINAL DISCHARGE OR ITCHING   Items with * indicate a potential emergency and should be followed up as soon as possible or go to the Emergency Department if any problems should occur.  Please show the CHEMOTHERAPY ALERT CARD or IMMUNOTHERAPY ALERT CARD at  check-in to the Emergency Department and triage nurse.  Should you have questions after your visit or need to cancel or reschedule your appointment, please contact [facility] [phone]  and follow the prompts.  Office hours are 8:00 a.m. to 4:30 p.m. Monday - Friday. Please note that voicemails left after 4:00 p.m. may not be returned until the following business day.  We are closed weekends and major holidays. You have access to a nurse at all times for urgent questions. Please call the main number to the clinic [phone] and follow the prompts.  For any non-urgent questions, you may also contact your provider using MyChart. We now offer e-Visits for anyone 1 and older to request care online for non-urgent symptoms. For details visit [email or web address].   Also download the MyChart app! Go to the app store, search "MyChart", open the app, select [facility], and log in with your MyChart username and password.

## 2022-09-16 NOTE — Progress Notes (Signed)
Patient has been examined by Dr. Katragadda. Vital signs and labs have been reviewed by MD - ANC, Creatinine, LFTs, hemoglobin, and platelets are within treatment parameters per M.D. - pt may proceed with treatment.  Primary RN and pharmacy notified.  

## 2022-09-17 LAB — CEA: CEA: 4.2 ng/mL (ref 0.0–4.7)

## 2022-09-18 ENCOUNTER — Inpatient Hospital Stay: Payer: Medicaid Other

## 2022-09-18 VITALS — BP 160/82 | HR 68 | Temp 98.4°F | Resp 18

## 2022-09-18 DIAGNOSIS — C189 Malignant neoplasm of colon, unspecified: Secondary | ICD-10-CM

## 2022-09-18 DIAGNOSIS — Z5112 Encounter for antineoplastic immunotherapy: Secondary | ICD-10-CM | POA: Diagnosis not present

## 2022-09-18 MED ORDER — SODIUM CHLORIDE 0.9% FLUSH
10.0000 mL | INTRAVENOUS | Status: DC | PRN
  Administered 2022-09-18: 10 mL

## 2022-09-18 MED ORDER — HEPARIN SOD (PORK) LOCK FLUSH 100 UNIT/ML IV SOLN
500.0000 [IU] | Freq: Once | INTRAVENOUS | Status: AC | PRN
  Administered 2022-09-18: 500 [IU]

## 2022-09-18 NOTE — Progress Notes (Signed)
Patient presents today for 5FU pump stop and disconnection after 46 hour continous infusion.   5FU pump deaccessed.  Patients port flushed without difficulty.  Good blood return noted with no bruising or swelling noted at site.  Needle removed intact.  Band aid applied.  VSS with discharge and left in satisfactory condition via wheelchair with no s/s of distress noted.    

## 2022-09-18 NOTE — Patient Instructions (Signed)
[facility]  Discharge Instructions: Thank you for choosing [facility] to provide your oncology and hematology care.  If you have a lab appointment with the Cancer Center, please come in thru the Main Entrance and check in at the main information desk.  Wear comfortable clothing and clothing appropriate for easy access to any Portacath or PICC line.   We strive to give you quality time with your provider. You may need to reschedule your appointment if you arrive late (15 or more minutes).  Arriving late affects you and other patients whose appointments are after yours.  Also, if you miss three or more appointments without notifying the office, you may be dismissed from the clinic at the provider's discretion.      For prescription refill requests, have your pharmacy contact our office and allow 72 hours for refills to be completed.    Today you received the following chemotherapy and/or immunotherapy agents 5FU pump stop      To help prevent nausea and vomiting after your treatment, we encourage you to take your nausea medication as directed.  BELOW ARE SYMPTOMS THAT SHOULD BE REPORTED IMMEDIATELY: *FEVER GREATER THAN 100.4 F (38 C) OR HIGHER *CHILLS OR SWEATING *NAUSEA AND VOMITING THAT IS NOT CONTROLLED WITH YOUR NAUSEA MEDICATION *UNUSUAL SHORTNESS OF BREATH *UNUSUAL BRUISING OR BLEEDING *URINARY PROBLEMS (pain or burning when urinating, or frequent urination) *BOWEL PROBLEMS (unusual diarrhea, constipation, pain near the anus) TENDERNESS IN MOUTH AND THROAT WITH OR WITHOUT PRESENCE OF ULCERS (sore throat, sores in mouth, or a toothache) UNUSUAL RASH, SWELLING OR PAIN  UNUSUAL VAGINAL DISCHARGE OR ITCHING   Items with * indicate a potential emergency and should be followed up as soon as possible or go to the Emergency Department if any problems should occur.  Please show the CHEMOTHERAPY ALERT CARD or IMMUNOTHERAPY ALERT CARD at check-in to the  Emergency Department and triage nurse.  Should you have questions after your visit or need to cancel or reschedule your appointment, please contact [facility] [phone]  and follow the prompts.  Office hours are 8:00 a.m. to 4:30 p.m. Monday - Friday. Please note that voicemails left after 4:00 p.m. may not be returned until the following business day.  We are closed weekends and major holidays. You have access to a nurse at all times for urgent questions. Please call the main number to the clinic [phone] and follow the prompts.  For any non-urgent questions, you may also contact your provider using MyChart. We now offer e-Visits for anyone 18 and older to request care online for non-urgent symptoms. For details visit [email or web address].   Also download the MyChart app! Go to the app store, search "MyChart", open the app, select [facility], and log in with your MyChart username and password.   

## 2022-09-29 ENCOUNTER — Other Ambulatory Visit: Payer: Self-pay

## 2022-09-30 ENCOUNTER — Inpatient Hospital Stay: Payer: Medicaid Other | Attending: Hematology

## 2022-09-30 ENCOUNTER — Ambulatory Visit: Payer: Medicaid Other | Admitting: Hematology

## 2022-09-30 ENCOUNTER — Inpatient Hospital Stay: Payer: Medicaid Other

## 2022-09-30 VITALS — BP 149/81 | HR 76 | Temp 97.1°F | Resp 18

## 2022-09-30 DIAGNOSIS — C169 Malignant neoplasm of stomach, unspecified: Secondary | ICD-10-CM | POA: Diagnosis not present

## 2022-09-30 DIAGNOSIS — C189 Malignant neoplasm of colon, unspecified: Secondary | ICD-10-CM

## 2022-09-30 DIAGNOSIS — C787 Secondary malignant neoplasm of liver and intrahepatic bile duct: Secondary | ICD-10-CM | POA: Diagnosis not present

## 2022-09-30 DIAGNOSIS — E876 Hypokalemia: Secondary | ICD-10-CM

## 2022-09-30 DIAGNOSIS — Z5112 Encounter for antineoplastic immunotherapy: Secondary | ICD-10-CM | POA: Diagnosis present

## 2022-09-30 LAB — URINALYSIS, DIPSTICK ONLY
Bilirubin Urine: NEGATIVE
Glucose, UA: NEGATIVE mg/dL
Hgb urine dipstick: NEGATIVE
Ketones, ur: NEGATIVE mg/dL
Leukocytes,Ua: NEGATIVE
Nitrite: NEGATIVE
Protein, ur: 30 mg/dL — AB
Specific Gravity, Urine: 1.019 (ref 1.005–1.030)
pH: 5 (ref 5.0–8.0)

## 2022-09-30 LAB — COMPREHENSIVE METABOLIC PANEL
ALT: 38 U/L (ref 0–44)
AST: 39 U/L (ref 15–41)
Albumin: 3.4 g/dL — ABNORMAL LOW (ref 3.5–5.0)
Alkaline Phosphatase: 96 U/L (ref 38–126)
Anion gap: 10 (ref 5–15)
BUN: 11 mg/dL (ref 8–23)
CO2: 25 mmol/L (ref 22–32)
Calcium: 8.5 mg/dL — ABNORMAL LOW (ref 8.9–10.3)
Chloride: 99 mmol/L (ref 98–111)
Creatinine, Ser: 0.9 mg/dL (ref 0.44–1.00)
GFR, Estimated: >60 mL/min (ref >60)
Glucose, Bld: 178 mg/dL — ABNORMAL HIGH (ref 70–99)
Potassium: 3.1 mmol/L — ABNORMAL LOW (ref 3.5–5.1)
Sodium: 134 mmol/L — ABNORMAL LOW (ref 135–145)
Total Bilirubin: 0.8 mg/dL (ref 0.3–1.2)
Total Protein: 6.9 g/dL (ref 6.5–8.1)

## 2022-09-30 LAB — CBC WITH DIFFERENTIAL/PLATELET
Abs Immature Granulocytes: 0.01 10*3/uL (ref 0.00–0.07)
Basophils Absolute: 0 10*3/uL (ref 0.0–0.1)
Basophils Relative: 1 %
Eosinophils Absolute: 0.2 10*3/uL (ref 0.0–0.5)
Eosinophils Relative: 3 %
HCT: 40.4 % (ref 36.0–46.0)
Hemoglobin: 13.7 g/dL (ref 12.0–15.0)
Immature Granulocytes: 0 %
Lymphocytes Relative: 25 %
Lymphs Abs: 1.4 10*3/uL (ref 0.7–4.0)
MCH: 32.4 pg (ref 26.0–34.0)
MCHC: 33.9 g/dL (ref 30.0–36.0)
MCV: 95.5 fL (ref 80.0–100.0)
Monocytes Absolute: 0.5 10*3/uL (ref 0.1–1.0)
Monocytes Relative: 9 %
Neutro Abs: 3.4 10*3/uL (ref 1.7–7.7)
Neutrophils Relative %: 62 %
Platelets: 118 10*3/uL — ABNORMAL LOW (ref 150–400)
RBC: 4.23 MIL/uL (ref 3.87–5.11)
RDW: 16.1 % — ABNORMAL HIGH (ref 11.5–15.5)
WBC: 5.5 10*3/uL (ref 4.0–10.5)
nRBC: 0 % (ref 0.0–0.2)

## 2022-09-30 LAB — MAGNESIUM: Magnesium: 1.9 mg/dL (ref 1.7–2.4)

## 2022-09-30 MED ORDER — SODIUM CHLORIDE 0.9% FLUSH
10.0000 mL | Freq: Once | INTRAVENOUS | Status: AC
  Administered 2022-09-30: 10 mL via INTRAVENOUS

## 2022-09-30 MED ORDER — FLUOROURACIL CHEMO INJECTION 500 MG/10ML
270.0000 mg/m2 | Freq: Once | INTRAVENOUS | Status: AC
  Administered 2022-09-30: 500 mg via INTRAVENOUS
  Filled 2022-09-30: qty 10

## 2022-09-30 MED ORDER — SODIUM CHLORIDE 0.9 % IV SOLN
3250.0000 mg | INTRAVENOUS | Status: DC
  Administered 2022-09-30: 3250 mg via INTRAVENOUS
  Filled 2022-09-30: qty 65

## 2022-09-30 MED ORDER — POTASSIUM CHLORIDE CRYS ER 20 MEQ PO TBCR
40.0000 meq | EXTENDED_RELEASE_TABLET | Freq: Once | ORAL | Status: AC
  Administered 2022-09-30: 40 meq via ORAL
  Filled 2022-09-30: qty 2

## 2022-09-30 MED ORDER — SODIUM CHLORIDE 0.9 % IV SOLN
10.0000 mg | Freq: Once | INTRAVENOUS | Status: AC
  Administered 2022-09-30: 10 mg via INTRAVENOUS
  Filled 2022-09-30: qty 10

## 2022-09-30 MED ORDER — SODIUM CHLORIDE 0.9 % IV SOLN: Freq: Once | INTRAVENOUS | Status: AC

## 2022-09-30 MED ORDER — SODIUM CHLORIDE 0.9 % IV SOLN
320.0000 mg/m2 | Freq: Once | INTRAVENOUS | Status: AC
  Administered 2022-09-30: 628 mg via INTRAVENOUS
  Filled 2022-09-30: qty 31.4

## 2022-09-30 MED ORDER — PALONOSETRON HCL INJECTION 0.25 MG/5ML
0.2500 mg | Freq: Once | INTRAVENOUS | Status: AC
  Administered 2022-09-30: 0.25 mg via INTRAVENOUS
  Filled 2022-09-30: qty 5

## 2022-09-30 MED ORDER — SODIUM CHLORIDE 0.9 % IV SOLN
5.0000 mg/kg | Freq: Once | INTRAVENOUS | Status: AC
  Administered 2022-09-30: 400 mg via INTRAVENOUS
  Filled 2022-09-30: qty 16

## 2022-09-30 NOTE — Patient Instructions (Signed)
[place]  Discharge Instructions: Thank you for choosing [facility] to provide your oncology and hematology care.  If you have a lab appointment with the [facility] - please note that after April 8th, 2024, all labs will be drawn in the cancer center.  You do not have to check in or register with the main entrance as you have in the past but will complete your check-in in the cancer center.  Wear comfortable clothing and clothing appropriate for easy access to any Portacath or PICC line.   We strive to give you quality time with your provider. You may need to reschedule your appointment if you arrive late (15 or more minutes).  Arriving late affects you and other patients whose appointments are after yours.  Also, if you miss three or more appointments without notifying the office, you may be dismissed from the clinic at the provider's discretion.      For prescription refill requests, have your pharmacy contact our office and allow 72 hours for refills to be completed.    Today you received the following chemotherapy and/or immunotherapy agents Vegzelma/Leucovorin/5FU.  Leucovorin Injection What is this medication? LEUCOVORIN (loo koe VOR in) prevents side effects from certain medications, such as methotrexate. It works by increasing folate levels. This helps protect healthy cells in your body. It may also be used to treat anemia caused by low levels of folate. It can also be used with fluorouracil, a type of chemotherapy, to treat colorectal cancer. It works by increasing the effects of fluorouracil in the body. This medicine may be used for other purposes; ask your health care provider or pharmacist if you have questions. What should I tell my care team before I take this medication? They need to know if you have any of these conditions: Anemia from low levels of vitamin B12 in the blood An unusual or allergic reaction to leucovorin, folic acid, other  medications, foods, dyes, or preservatives Pregnant or trying to get pregnant Breastfeeding How should I use this medication? This medication is injected into a vein or a muscle. It is given by your care team in a hospital or clinic setting. Talk to your care team about the use of this medication in children. Special care may be needed. Overdosage: If you think you have taken too much of this medicine contact a poison control center or emergency room at once. NOTE: This medicine is only for you. Do not share this medicine with others. What if I miss a dose? Keep appointments for follow-up doses. It is important not to miss your dose. Call your care team if you are unable to keep an appointment. What may interact with this medication? Capecitabine Fluorouracil Phenobarbital Phenytoin Primidone Trimethoprim;sulfamethoxazole This list may not describe all possible interactions. Give your health care provider a list of all the medicines, herbs, non-prescription drugs, or dietary supplements you use. Also tell them if you smoke, drink alcohol, or use illegal drugs. Some items may interact with your medicine. What should I watch for while using this medication? Your condition will be monitored carefully while you are receiving this medication. This medication may increase the side effects of 5-fluorouracil. Tell your care team if you have diarrhea or mouth sores that do not get better or that get worse. What side effects may I notice from receiving this medication? Side effects that you should report to your care team as soon as possible: Allergic reactions--skin rash, itching, hives, swelling of  the face, lips, tongue, or throat This list may not describe all possible side effects. Call your doctor for medical advice about side effects. You may report side effects to FDA at 1-800-FDA-1088. Where should I keep my medication? This medication is given in a hospital or clinic. It will not be stored  at home. NOTE: This sheet is a summary. It may not cover all possible information. If you have questions about this medicine, talk to your doctor, pharmacist, or health care provider.  2023 Elsevier/Gold Standard (2021-10-24 00:00:00)    Fluorouracil Injection What is this medication? FLUOROURACIL (flure oh YOOR a sil) treats some types of cancer. It works by slowing down the growth of cancer cells. This medicine may be used for other purposes; ask your health care provider or pharmacist if you have questions. COMMON BRAND NAME(S): Adrucil What should I tell my care team before I take this medication? They need to know if you have any of these conditions: Blood disorders Dihydropyrimidine dehydrogenase (DPD) deficiency Infection, such as chickenpox, cold sores, herpes Kidney disease Liver disease Poor nutrition Recent or ongoing radiation therapy An unusual or allergic reaction to fluorouracil, other medications, foods, dyes, or preservatives If you or your partner are pregnant or trying to get pregnant Breast-feeding How should I use this medication? This medication is injected into a vein. It is administered by your care team in a hospital or clinic setting. Talk to your care team about the use of this medication in children. Special care may be needed. Overdosage: If you think you have taken too much of this medicine contact a poison control center or emergency room at once. NOTE: This medicine is only for you. Do not share this medicine with others. What if I miss a dose? Keep appointments for follow-up doses. It is important not to miss your dose. Call your care team if you are unable to keep an appointment. What may interact with this medication? Do not take this medication with any of the following: Live virus vaccines This medication may also interact with the following: Medications that treat or prevent blood clots, such as warfarin, enoxaparin, dalteparin This list may  not describe all possible interactions. Give your health care provider a list of all the medicines, herbs, non-prescription drugs, or dietary supplements you use. Also tell them if you smoke, drink alcohol, or use illegal drugs. Some items may interact with your medicine. What should I watch for while using this medication? Your condition will be monitored carefully while you are receiving this medication. This medication may make you feel generally unwell. This is not uncommon as chemotherapy can affect healthy cells as well as cancer cells. Report any side effects. Continue your course of treatment even though you feel ill unless your care team tells you to stop. In some cases, you may be given additional medications to help with side effects. Follow all directions for their use. This medication may increase your risk of getting an infection. Call your care team for advice if you get a fever, chills, sore throat, or other symptoms of a cold or flu. Do not treat yourself. Try to avoid being around people who are sick. This medication may increase your risk to bruise or bleed. Call your care team if you notice any unusual bleeding. Be careful brushing or flossing your teeth or using a toothpick because you may get an infection or bleed more easily. If you have any dental work done, tell your dentist you are receiving this  medication. Avoid taking medications that contain aspirin, acetaminophen, ibuprofen, naproxen, or ketoprofen unless instructed by your care team. These medications may hide a fever. Do not treat diarrhea with over the counter products. Contact your care team if you have diarrhea that lasts more than 2 days or if it is severe and watery. This medication can make you more sensitive to the sun. Keep out of the sun. If you cannot avoid being in the sun, wear protective clothing and sunscreen. Do not use sun lamps, tanning beds, or tanning booths. Talk to your care team if you or your partner  wish to become pregnant or think you might be pregnant. This medication can cause serious birth defects if taken during pregnancy and for 3 months after the last dose. A reliable form of contraception is recommended while taking this medication and for 3 months after the last dose. Talk to your care team about effective forms of contraception. Do not father a child while taking this medication and for 3 months after the last dose. Use a condom while having sex during this time period. Do not breastfeed while taking this medication. This medication may cause infertility. Talk to your care team if you are concerned about your fertility. What side effects may I notice from receiving this medication? Side effects that you should report to your care team as soon as possible: Allergic reactions--skin rash, itching, hives, swelling of the face, lips, tongue, or throat Heart attack--pain or tightness in the chest, shoulders, arms, or jaw, nausea, shortness of breath, cold or clammy skin, feeling faint or lightheaded Heart failure--shortness of breath, swelling of the ankles, feet, or hands, sudden weight gain, unusual weakness or fatigue Heart rhythm changes--fast or irregular heartbeat, dizziness, feeling faint or lightheaded, chest pain, trouble breathing High ammonia level--unusual weakness or fatigue, confusion, loss of appetite, nausea, vomiting, seizures Infection--fever, chills, cough, sore throat, wounds that don't heal, pain or trouble when passing urine, general feeling of discomfort or being unwell Low red blood cell level--unusual weakness or fatigue, dizziness, headache, trouble breathing Pain, tingling, or numbness in the hands or feet, muscle weakness, change in vision, confusion or trouble speaking, loss of balance or coordination, trouble walking, seizures Redness, swelling, and blistering of the skin over hands and feet Severe or prolonged diarrhea Unusual bruising or bleeding Side effects  that usually do not require medical attention (report to your care team if they continue or are bothersome): Dry skin Headache Increased tears Nausea Pain, redness, or swelling with sores inside the mouth or throat Sensitivity to light Vomiting This list may not describe all possible side effects. Call your doctor for medical advice about side effects. You may report side effects to FDA at 1-800-FDA-1088. Where should I keep my medication? This medication is given in a hospital or clinic. It will not be stored at home. NOTE: This sheet is a summary. It may not cover all possible information. If you have questions about this medicine, talk to your doctor, pharmacist, or health care provider.  2023 Elsevier/Gold Standard (2021-10-14 00:00:00)        To help prevent nausea and vomiting after your treatment, we encourage you to take your nausea medication as directed.  BELOW ARE SYMPTOMS THAT SHOULD BE REPORTED IMMEDIATELY: *FEVER GREATER THAN 100.4 F (38 C) OR HIGHER *CHILLS OR SWEATING *NAUSEA AND VOMITING THAT IS NOT CONTROLLED WITH YOUR NAUSEA MEDICATION *UNUSUAL SHORTNESS OF BREATH *UNUSUAL BRUISING OR BLEEDING *URINARY PROBLEMS (pain or burning when urinating, or frequent urination) *BOWEL  PROBLEMS (unusual diarrhea, constipation, pain near the anus) TENDERNESS IN MOUTH AND THROAT WITH OR WITHOUT PRESENCE OF ULCERS (sore throat, sores in mouth, or a toothache) UNUSUAL RASH, SWELLING OR PAIN  UNUSUAL VAGINAL DISCHARGE OR ITCHING   Items with * indicate a potential emergency and should be followed up as soon as possible or go to the Emergency Department if any problems should occur.  Please show the CHEMOTHERAPY ALERT CARD or IMMUNOTHERAPY ALERT CARD at check-in to the Emergency Department and triage nurse.  Should you have questions after your visit or need to cancel or reschedule your appointment, please contact [facility] [phone]  and follow the  prompts.  Office hours are 8:00 a.m. to 4:30 p.m. Monday - Friday. Please note that voicemails left after 4:00 p.m. may not be returned until the following business day.  We are closed weekends and major holidays. You have access to a nurse at all times for urgent questions. Please call the main number to the clinic [phone] and follow the prompts.  For any non-urgent questions, you may also contact your provider using MyChart. We now offer e-Visits for anyone 53 and older to request care online for non-urgent symptoms. For details visit [email or web address].   Also download the MyChart app! Go to the app store, search "MyChart", open the app, select [facility], and log in with your MyChart username and password.

## 2022-09-30 NOTE — Progress Notes (Signed)
Patients port flushed without difficulty.  Good blood return noted with no bruising or swelling noted at site.  Stable during access and blood draw.  Patient remains accessed for treatment.   

## 2022-09-30 NOTE — Progress Notes (Signed)
Patient presents today for chemotherapy infusion.  Patient is in satisfactory condition with no new complaints voiced.  Vital signs are stable.  Labs reviewed and all labs are within treatment parameters.  Potassium today is 3.1.  We will give Klor Con 40 mEq PO x one dose today per standing orders by Dr. Delton Coombes.  We will proceed with treatment per MD orders.    Patient advised by Dr. Delton Coombes to increase Klor Con prescription at home from 2 tablets daily to 4 tablets daily.    Patient tolerated treatment well with no complaints voiced. Home infusion 5FU pump connected with no issues.  Patient left ambulatory in stable condition.  Vital signs stable at discharge.  Follow up as scheduled.

## 2022-10-01 ENCOUNTER — Other Ambulatory Visit: Payer: Self-pay

## 2022-10-02 ENCOUNTER — Inpatient Hospital Stay: Payer: Medicaid Other

## 2022-10-02 VITALS — BP 156/82 | HR 78 | Temp 98.2°F | Resp 18

## 2022-10-02 DIAGNOSIS — Z5112 Encounter for antineoplastic immunotherapy: Secondary | ICD-10-CM | POA: Diagnosis not present

## 2022-10-02 DIAGNOSIS — C189 Malignant neoplasm of colon, unspecified: Secondary | ICD-10-CM

## 2022-10-02 MED ORDER — SODIUM CHLORIDE 0.9% FLUSH
10.0000 mL | INTRAVENOUS | Status: DC | PRN
  Administered 2022-10-02: 10 mL

## 2022-10-02 MED ORDER — HEPARIN SOD (PORK) LOCK FLUSH 100 UNIT/ML IV SOLN
500.0000 [IU] | Freq: Once | INTRAVENOUS | Status: AC | PRN
  Administered 2022-10-02: 500 [IU]

## 2022-10-02 NOTE — Patient Instructions (Signed)
[facility]  Discharge Instructions: Thank you for choosing [facility] to provide your oncology and hematology care.  If you have a lab appointment with the Cancer Center - please note that after April 8th, 2024, all labs will be drawn in the cancer center.  You do not have to check in or register with the main entrance as you have in the past but will complete your check-in in the cancer center.  Wear comfortable clothing and clothing appropriate for easy access to any Portacath or PICC line.   We strive to give you quality time with your provider. You may need to reschedule your appointment if you arrive late (15 or more minutes).  Arriving late affects you and other patients whose appointments are after yours.  Also, if you miss three or more appointments without notifying the office, you may be dismissed from the clinic at the provider's discretion.      For prescription refill requests, have your pharmacy contact our office and allow 72 hours for refills to be completed.    Today you received the following chemotherapy and/or immunotherapy agents pump DC      To help prevent nausea and vomiting after your treatment, we encourage you to take your nausea medication as directed.  BELOW ARE SYMPTOMS THAT SHOULD BE REPORTED IMMEDIATELY: *FEVER GREATER THAN 100.4 F (38 C) OR HIGHER *CHILLS OR SWEATING *NAUSEA AND VOMITING THAT IS NOT CONTROLLED WITH YOUR NAUSEA MEDICATION *UNUSUAL SHORTNESS OF BREATH *UNUSUAL BRUISING OR BLEEDING *URINARY PROBLEMS (pain or burning when urinating, or frequent urination) *BOWEL PROBLEMS (unusual diarrhea, constipation, pain near the anus) TENDERNESS IN MOUTH AND THROAT WITH OR WITHOUT PRESENCE OF ULCERS (sore throat, sores in mouth, or a toothache) UNUSUAL RASH, SWELLING OR PAIN  UNUSUAL VAGINAL DISCHARGE OR ITCHING   Items with * indicate a potential emergency and should be followed up as soon as possible or go to the  Emergency Department if any problems should occur.  Please show the CHEMOTHERAPY ALERT CARD or IMMUNOTHERAPY ALERT CARD at check-in to the Emergency Department and triage nurse.  Should you have questions after your visit or need to cancel or reschedule your appointment, please contact [facility] [phone]  and follow the prompts.  Office hours are 8:00 a.m. to 4:30 p.m. Monday - Friday. Please note that voicemails left after 4:00 p.m. may not be returned until the following business day.  We are closed weekends and major holidays. You have access to a nurse at all times for urgent questions. Please call the main number to the clinic [phone] and follow the prompts.  For any non-urgent questions, you may also contact your provider using MyChart. We now offer e-Visits for anyone 71 and older to request care online for non-urgent symptoms. For details visit [email or web address].   Also download the MyChart app! Go to the app store, search "MyChart", open the app, select [facility], and log in with your MyChart username and password.

## 2022-10-13 ENCOUNTER — Other Ambulatory Visit: Payer: Self-pay

## 2022-10-14 ENCOUNTER — Inpatient Hospital Stay: Payer: Medicaid Other

## 2022-10-14 ENCOUNTER — Ambulatory Visit: Payer: Medicaid Other | Admitting: Hematology

## 2022-10-14 VITALS — BP 157/81 | HR 87 | Temp 98.6°F | Resp 20 | Wt 183.4 lb

## 2022-10-14 DIAGNOSIS — C189 Malignant neoplasm of colon, unspecified: Secondary | ICD-10-CM

## 2022-10-14 DIAGNOSIS — Z5112 Encounter for antineoplastic immunotherapy: Secondary | ICD-10-CM | POA: Diagnosis not present

## 2022-10-14 DIAGNOSIS — Z95828 Presence of other vascular implants and grafts: Secondary | ICD-10-CM

## 2022-10-14 LAB — COMPREHENSIVE METABOLIC PANEL
ALT: 41 U/L (ref 0–44)
AST: 40 U/L (ref 15–41)
Albumin: 3.5 g/dL (ref 3.5–5.0)
Alkaline Phosphatase: 92 U/L (ref 38–126)
Anion gap: 12 (ref 5–15)
BUN: 9 mg/dL (ref 8–23)
CO2: 24 mmol/L (ref 22–32)
Calcium: 8.7 mg/dL — ABNORMAL LOW (ref 8.9–10.3)
Chloride: 97 mmol/L — ABNORMAL LOW (ref 98–111)
Creatinine, Ser: 0.99 mg/dL (ref 0.44–1.00)
GFR, Estimated: >60 mL/min (ref >60)
Glucose, Bld: 167 mg/dL — ABNORMAL HIGH (ref 70–99)
Potassium: 3.3 mmol/L — ABNORMAL LOW (ref 3.5–5.1)
Sodium: 133 mmol/L — ABNORMAL LOW (ref 135–145)
Total Bilirubin: 1.3 mg/dL — ABNORMAL HIGH (ref 0.3–1.2)
Total Protein: 7.1 g/dL (ref 6.5–8.1)

## 2022-10-14 LAB — URINALYSIS, DIPSTICK ONLY
Bilirubin Urine: NEGATIVE
Glucose, UA: NEGATIVE mg/dL
Hgb urine dipstick: NEGATIVE
Ketones, ur: NEGATIVE mg/dL
Leukocytes,Ua: NEGATIVE
Nitrite: NEGATIVE
Protein, ur: 100 mg/dL — AB
Specific Gravity, Urine: 1.016 (ref 1.005–1.030)
pH: 5 (ref 5.0–8.0)

## 2022-10-14 LAB — CBC WITH DIFFERENTIAL/PLATELET
Abs Immature Granulocytes: 0.02 10*3/uL (ref 0.00–0.07)
Basophils Absolute: 0 10*3/uL (ref 0.0–0.1)
Basophils Relative: 1 %
Eosinophils Absolute: 0.1 10*3/uL (ref 0.0–0.5)
Eosinophils Relative: 2 %
HCT: 40.7 % (ref 36.0–46.0)
Hemoglobin: 13.6 g/dL (ref 12.0–15.0)
Immature Granulocytes: 0 %
Lymphocytes Relative: 20 %
Lymphs Abs: 1.3 10*3/uL (ref 0.7–4.0)
MCH: 31.9 pg (ref 26.0–34.0)
MCHC: 33.4 g/dL (ref 30.0–36.0)
MCV: 95.3 fL (ref 80.0–100.0)
Monocytes Absolute: 0.6 10*3/uL (ref 0.1–1.0)
Monocytes Relative: 9 %
Neutro Abs: 4.4 10*3/uL (ref 1.7–7.7)
Neutrophils Relative %: 68 %
Platelets: 115 10*3/uL — ABNORMAL LOW (ref 150–400)
RBC: 4.27 MIL/uL (ref 3.87–5.11)
RDW: 16.2 % — ABNORMAL HIGH (ref 11.5–15.5)
WBC: 6.4 10*3/uL (ref 4.0–10.5)
nRBC: 0 % (ref 0.0–0.2)

## 2022-10-14 LAB — MAGNESIUM: Magnesium: 1.8 mg/dL (ref 1.7–2.4)

## 2022-10-14 MED ORDER — SODIUM CHLORIDE 0.9 % IV SOLN
3250.0000 mg | INTRAVENOUS | Status: DC
  Administered 2022-10-14: 3250 mg via INTRAVENOUS
  Filled 2022-10-14: qty 65

## 2022-10-14 MED ORDER — SODIUM CHLORIDE 0.9 % IV SOLN
10.0000 mg | Freq: Once | INTRAVENOUS | Status: AC
  Administered 2022-10-14: 10 mg via INTRAVENOUS
  Filled 2022-10-14: qty 1

## 2022-10-14 MED ORDER — PALONOSETRON HCL INJECTION 0.25 MG/5ML
0.2500 mg | Freq: Once | INTRAVENOUS | Status: AC
  Administered 2022-10-14: 0.25 mg via INTRAVENOUS
  Filled 2022-10-14: qty 5

## 2022-10-14 MED ORDER — SODIUM CHLORIDE 0.9 % IV SOLN
320.0000 mg/m2 | Freq: Once | INTRAVENOUS | Status: AC
  Administered 2022-10-14: 628 mg via INTRAVENOUS
  Filled 2022-10-14: qty 31.4

## 2022-10-14 MED ORDER — SODIUM CHLORIDE 0.9% FLUSH: 10.0000 mL | INTRAVENOUS | Status: DC | PRN

## 2022-10-14 MED ORDER — DEXTROSE 5 % IV SOLN: Freq: Once | INTRAVENOUS | Status: DC

## 2022-10-14 MED ORDER — HEPARIN SOD (PORK) LOCK FLUSH 100 UNIT/ML IV SOLN: 500.0000 [IU] | Freq: Once | INTRAVENOUS | Status: DC | PRN

## 2022-10-14 MED ORDER — SODIUM CHLORIDE 0.9% FLUSH
10.0000 mL | Freq: Once | INTRAVENOUS | Status: AC
  Administered 2022-10-14: 10 mL via INTRAVENOUS

## 2022-10-14 MED ORDER — SODIUM CHLORIDE 0.9 % IV SOLN
5.0000 mg/kg | Freq: Once | INTRAVENOUS | Status: AC
  Administered 2022-10-14: 400 mg via INTRAVENOUS
  Filled 2022-10-14: qty 16

## 2022-10-14 MED ORDER — SODIUM CHLORIDE 0.9 % IV SOLN: Freq: Once | INTRAVENOUS | Status: AC

## 2022-10-14 MED ORDER — FLUOROURACIL CHEMO INJECTION 500 MG/10ML
270.0000 mg/m2 | Freq: Once | INTRAVENOUS | Status: AC
  Administered 2022-10-14: 500 mg via INTRAVENOUS
  Filled 2022-10-14: qty 10

## 2022-10-14 NOTE — Progress Notes (Signed)
Patient presents today for Vegzelma/Leucovorin/Fluororuracil with pump start per providers order.  Vital signs within parameters for treatment.  Urine protein noted to be 100, MD notified.  Message from Dr. Ellin Saba okay to proceed with treatment.  Treatment given today per MD orders.  Stable during infusion without adverse affects.  5FU pump start and verified RUN on the screen with the patient.  Vital signs stable.  No complaints at this time.  Discharge from clinic ambulatory in stable condition.  Alert and oriented X 3.  Follow up with [facility] as scheduled.

## 2022-10-14 NOTE — Progress Notes (Signed)
Ok to treat proceed Vegzelma and urine protein 100.  T.O. Dr Carilyn Goodpasture, PharmD

## 2022-10-14 NOTE — Patient Instructions (Signed)
[facility]  Discharge Instructions: Thank you for choosing [facility] to provide your oncology and hematology care.  If you have a lab appointment with the Cancer Center - please note that after April 8th, 2024, all labs will be drawn in the cancer center.  You do not have to check in or register with the main entrance as you have in the past but will complete your check-in in the cancer center.  Wear comfortable clothing and clothing appropriate for easy access to any Portacath or PICC line.   We strive to give you quality time with your provider. You may need to reschedule your appointment if you arrive late (15 or more minutes).  Arriving late affects you and other patients whose appointments are after yours.  Also, if you miss three or more appointments without notifying the office, you may be dismissed from the clinic at the provider's discretion.      For prescription refill requests, have your pharmacy contact our office and allow 72 hours for refills to be completed.    Today you received the following chemotherapy and/or immunotherapy agents Vegzelma/Leucovorin/ fluorouracil with 5FU pump start      To help prevent nausea and vomiting after your treatment, we encourage you to take your nausea medication as directed.  BELOW ARE SYMPTOMS THAT SHOULD BE REPORTED IMMEDIATELY: *FEVER GREATER THAN 100.4 F (38 C) OR HIGHER *CHILLS OR SWEATING *NAUSEA AND VOMITING THAT IS NOT CONTROLLED WITH YOUR NAUSEA MEDICATION *UNUSUAL SHORTNESS OF BREATH *UNUSUAL BRUISING OR BLEEDING *URINARY PROBLEMS (pain or burning when urinating, or frequent urination) *BOWEL PROBLEMS (unusual diarrhea, constipation, pain near the anus) TENDERNESS IN MOUTH AND THROAT WITH OR WITHOUT PRESENCE OF ULCERS (sore throat, sores in mouth, or a toothache) UNUSUAL RASH, SWELLING OR PAIN  UNUSUAL VAGINAL DISCHARGE OR ITCHING   Items with * indicate a potential emergency and should be  followed up as soon as possible or go to the Emergency Department if any problems should occur.  Please show the CHEMOTHERAPY ALERT CARD or IMMUNOTHERAPY ALERT CARD at check-in to the Emergency Department and triage nurse.  Should you have questions after your visit or need to cancel or reschedule your appointment, please contact [facility] AT [facility] [phone]  and follow the prompts.  Office hours are 8:00 a.m. to 4:30 p.m. Monday - Friday. Please note that voicemails left after 4:00 p.m. may not be returned until the following business day.  We are closed weekends and major holidays. You have access to a nurse at all times for urgent questions. Please call the main number to the clinic [phone] and follow the prompts.  For any non-urgent questions, you may also contact your provider using MyChart. We now offer e-Visits for anyone 16 and older to request care online for non-urgent symptoms. For details visit [email or web address].   Also download the MyChart app! Go to the app store, search "MyChart", open the app, select [facility], and log in with your MyChart username and password.

## 2022-10-16 ENCOUNTER — Inpatient Hospital Stay: Payer: Medicaid Other

## 2022-10-16 VITALS — BP 159/76 | HR 81 | Temp 97.8°F | Resp 18

## 2022-10-16 DIAGNOSIS — Z5112 Encounter for antineoplastic immunotherapy: Secondary | ICD-10-CM | POA: Diagnosis not present

## 2022-10-16 DIAGNOSIS — C189 Malignant neoplasm of colon, unspecified: Secondary | ICD-10-CM

## 2022-10-16 MED ORDER — HEPARIN SOD (PORK) LOCK FLUSH 100 UNIT/ML IV SOLN
500.0000 [IU] | Freq: Once | INTRAVENOUS | Status: AC | PRN
  Administered 2022-10-16: 500 [IU]

## 2022-10-16 MED ORDER — SODIUM CHLORIDE 0.9% FLUSH
10.0000 mL | INTRAVENOUS | Status: DC | PRN
  Administered 2022-10-16: 10 mL

## 2022-10-16 NOTE — Progress Notes (Signed)
Patients port flushed without difficulty.  Good blood return noted with no bruising or swelling noted at site. 5FU disconnected.   VSS with discharge and left in satisfactory condition with no s/s of distress noted.

## 2022-10-16 NOTE — Patient Instructions (Signed)
[facility]  Discharge Instructions: Thank you for choosing [facility] to provide your oncology and hematology care.  If you have a lab appointment with the Cancer Center - please note that after April 8th, 2024, all labs will be drawn in the cancer center.  You do not have to check in or register with the main entrance as you have in the past but will complete your check-in in the cancer center.  Wear comfortable clothing and clothing appropriate for easy access to any Portacath or PICC line.   We strive to give you quality time with your provider. You may need to reschedule your appointment if you arrive late (15 or more minutes).  Arriving late affects you and other patients whose appointments are after yours.  Also, if you miss three or more appointments without notifying the office, you may be dismissed from the clinic at the provider's discretion.      For prescription refill requests, have your pharmacy contact our office and allow 72 hours for refills to be completed.    Today you received the following 5FU pump disconnect.   To help prevent nausea and vomiting after your treatment, we encourage you to take your nausea medication as directed.  BELOW ARE SYMPTOMS THAT SHOULD BE REPORTED IMMEDIATELY: *FEVER GREATER THAN 100.4 F (38 C) OR HIGHER *CHILLS OR SWEATING *NAUSEA AND VOMITING THAT IS NOT CONTROLLED WITH YOUR NAUSEA MEDICATION *UNUSUAL SHORTNESS OF BREATH *UNUSUAL BRUISING OR BLEEDING *URINARY PROBLEMS (pain or burning when urinating, or frequent urination) *BOWEL PROBLEMS (unusual diarrhea, constipation, pain near the anus) TENDERNESS IN MOUTH AND THROAT WITH OR WITHOUT PRESENCE OF ULCERS (sore throat, sores in mouth, or a toothache) UNUSUAL RASH, SWELLING OR PAIN  UNUSUAL VAGINAL DISCHARGE OR ITCHING   Items with * indicate a potential emergency and should be followed up as soon as possible or go to the Emergency Department if any  problems should occur.  Please show the CHEMOTHERAPY ALERT CARD or IMMUNOTHERAPY ALERT CARD at check-in to the Emergency Department and triage nurse.  Should you have questions after your visit or need to cancel or reschedule your appointment, please contact [facility] AT [facility] [phone]  and follow the prompts.  Office hours are 8:00 a.m. to 4:30 p.m. Monday - Friday. Please note that voicemails left after 4:00 p.m. may not be returned until the following business day.  We are closed weekends and major holidays. You have access to a nurse at all times for urgent questions. Please call the main number to the clinic [phone] and follow the prompts.  For any non-urgent questions, you may also contact your provider using MyChart. We now offer e-Visits for anyone 81 and older to request care online for non-urgent symptoms. For details visit [email or web address].   Also download the MyChart app! Go to the app store, search "MyChart", open the app, select [facility], and log in with your MyChart username and password.

## 2022-10-28 ENCOUNTER — Ambulatory Visit: Payer: Medicaid Other | Admitting: Hematology

## 2022-10-28 ENCOUNTER — Inpatient Hospital Stay: Payer: No Typology Code available for payment source

## 2022-10-28 ENCOUNTER — Inpatient Hospital Stay: Payer: No Typology Code available for payment source | Attending: Hematology

## 2022-10-28 VITALS — BP 154/80 | HR 87 | Temp 97.5°F | Resp 18 | Wt 182.6 lb

## 2022-10-28 VITALS — BP 144/85 | HR 85 | Temp 97.9°F | Resp 18

## 2022-10-28 DIAGNOSIS — D509 Iron deficiency anemia, unspecified: Secondary | ICD-10-CM | POA: Insufficient documentation

## 2022-10-28 DIAGNOSIS — F419 Anxiety disorder, unspecified: Secondary | ICD-10-CM | POA: Diagnosis not present

## 2022-10-28 DIAGNOSIS — C187 Malignant neoplasm of sigmoid colon: Secondary | ICD-10-CM | POA: Insufficient documentation

## 2022-10-28 DIAGNOSIS — Z801 Family history of malignant neoplasm of trachea, bronchus and lung: Secondary | ICD-10-CM | POA: Insufficient documentation

## 2022-10-28 DIAGNOSIS — R161 Splenomegaly, not elsewhere classified: Secondary | ICD-10-CM | POA: Insufficient documentation

## 2022-10-28 DIAGNOSIS — Z5112 Encounter for antineoplastic immunotherapy: Secondary | ICD-10-CM | POA: Insufficient documentation

## 2022-10-28 DIAGNOSIS — K760 Fatty (change of) liver, not elsewhere classified: Secondary | ICD-10-CM | POA: Diagnosis not present

## 2022-10-28 DIAGNOSIS — C787 Secondary malignant neoplasm of liver and intrahepatic bile duct: Secondary | ICD-10-CM

## 2022-10-28 DIAGNOSIS — I10 Essential (primary) hypertension: Secondary | ICD-10-CM | POA: Diagnosis not present

## 2022-10-28 DIAGNOSIS — G629 Polyneuropathy, unspecified: Secondary | ICD-10-CM | POA: Diagnosis not present

## 2022-10-28 DIAGNOSIS — I700 Atherosclerosis of aorta: Secondary | ICD-10-CM | POA: Insufficient documentation

## 2022-10-28 DIAGNOSIS — Z87891 Personal history of nicotine dependence: Secondary | ICD-10-CM | POA: Diagnosis not present

## 2022-10-28 DIAGNOSIS — E876 Hypokalemia: Secondary | ICD-10-CM | POA: Diagnosis not present

## 2022-10-28 DIAGNOSIS — Z95828 Presence of other vascular implants and grafts: Secondary | ICD-10-CM

## 2022-10-28 LAB — CBC WITH DIFFERENTIAL/PLATELET
Abs Immature Granulocytes: 0.02 10*3/uL (ref 0.00–0.07)
Basophils Absolute: 0.1 10*3/uL (ref 0.0–0.1)
Basophils Relative: 1 %
Eosinophils Absolute: 0.2 10*3/uL (ref 0.0–0.5)
Eosinophils Relative: 2 %
HCT: 44.1 % (ref 36.0–46.0)
Hemoglobin: 14.9 g/dL (ref 12.0–15.0)
Immature Granulocytes: 0 %
Lymphocytes Relative: 23 %
Lymphs Abs: 1.6 10*3/uL (ref 0.7–4.0)
MCH: 32.3 pg (ref 26.0–34.0)
MCHC: 33.8 g/dL (ref 30.0–36.0)
MCV: 95.5 fL (ref 80.0–100.0)
Monocytes Absolute: 0.7 10*3/uL (ref 0.1–1.0)
Monocytes Relative: 10 %
Neutro Abs: 4.7 10*3/uL (ref 1.7–7.7)
Neutrophils Relative %: 64 %
Platelets: 132 10*3/uL — ABNORMAL LOW (ref 150–400)
RBC: 4.62 MIL/uL (ref 3.87–5.11)
RDW: 16 % — ABNORMAL HIGH (ref 11.5–15.5)
WBC: 7.3 10*3/uL (ref 4.0–10.5)
nRBC: 0 % (ref 0.0–0.2)

## 2022-10-28 LAB — URINALYSIS, DIPSTICK ONLY
Bilirubin Urine: NEGATIVE
Glucose, UA: NEGATIVE mg/dL
Hgb urine dipstick: NEGATIVE
Ketones, ur: NEGATIVE mg/dL
Leukocytes,Ua: NEGATIVE
Nitrite: NEGATIVE
Protein, ur: 100 mg/dL — AB
Specific Gravity, Urine: 1.014 (ref 1.005–1.030)
pH: 5 (ref 5.0–8.0)

## 2022-10-28 LAB — COMPREHENSIVE METABOLIC PANEL
ALT: 36 U/L (ref 0–44)
AST: 38 U/L (ref 15–41)
Albumin: 3.6 g/dL (ref 3.5–5.0)
Alkaline Phosphatase: 112 U/L (ref 38–126)
Anion gap: 11 (ref 5–15)
BUN: 13 mg/dL (ref 8–23)
CO2: 25 mmol/L (ref 22–32)
Calcium: 9.1 mg/dL (ref 8.9–10.3)
Chloride: 97 mmol/L — ABNORMAL LOW (ref 98–111)
Creatinine, Ser: 0.95 mg/dL (ref 0.44–1.00)
GFR, Estimated: >60 mL/min (ref >60)
Glucose, Bld: 141 mg/dL — ABNORMAL HIGH (ref 70–99)
Potassium: 3.1 mmol/L — ABNORMAL LOW (ref 3.5–5.1)
Sodium: 133 mmol/L — ABNORMAL LOW (ref 135–145)
Total Bilirubin: 1.3 mg/dL — ABNORMAL HIGH (ref 0.3–1.2)
Total Protein: 7.4 g/dL (ref 6.5–8.1)

## 2022-10-28 LAB — MAGNESIUM: Magnesium: 1.8 mg/dL (ref 1.7–2.4)

## 2022-10-28 MED ORDER — SODIUM CHLORIDE 0.9 % IV SOLN
5.0000 mg/kg | Freq: Once | INTRAVENOUS | Status: AC
  Administered 2022-10-28: 400 mg via INTRAVENOUS
  Filled 2022-10-28: qty 16

## 2022-10-28 MED ORDER — SODIUM CHLORIDE 0.9 % IV SOLN
10.0000 mg | Freq: Once | INTRAVENOUS | Status: AC
  Administered 2022-10-28: 10 mg via INTRAVENOUS
  Filled 2022-10-28: qty 10

## 2022-10-28 MED ORDER — SODIUM CHLORIDE 0.9% FLUSH
10.0000 mL | Freq: Once | INTRAVENOUS | Status: AC
  Administered 2022-10-28: 10 mL via INTRAVENOUS

## 2022-10-28 MED ORDER — POTASSIUM CHLORIDE CRYS ER 20 MEQ PO TBCR
40.0000 meq | EXTENDED_RELEASE_TABLET | Freq: Once | ORAL | Status: AC
  Administered 2022-10-28: 40 meq via ORAL

## 2022-10-28 MED ORDER — SODIUM CHLORIDE 0.9 % IV SOLN
3250.0000 mg | INTRAVENOUS | Status: DC
  Administered 2022-10-28: 3250 mg via INTRAVENOUS
  Filled 2022-10-28: qty 65

## 2022-10-28 MED ORDER — SODIUM CHLORIDE 0.9 % IV SOLN: Freq: Once | INTRAVENOUS | Status: AC

## 2022-10-28 MED ORDER — FLUOROURACIL CHEMO INJECTION 500 MG/10ML
270.0000 mg/m2 | Freq: Once | INTRAVENOUS | Status: AC
  Administered 2022-10-28: 500 mg via INTRAVENOUS
  Filled 2022-10-28: qty 10

## 2022-10-28 MED ORDER — SODIUM CHLORIDE 0.9 % IV SOLN
320.0000 mg/m2 | Freq: Once | INTRAVENOUS | Status: AC
  Administered 2022-10-28: 628 mg via INTRAVENOUS
  Filled 2022-10-28: qty 31.4

## 2022-10-28 MED ORDER — POTASSIUM CHLORIDE CRYS ER 20 MEQ PO TBCR
40.0000 meq | EXTENDED_RELEASE_TABLET | Freq: Once | ORAL | Status: DC
  Filled 2022-10-28: qty 2

## 2022-10-28 MED ORDER — PALONOSETRON HCL INJECTION 0.25 MG/5ML
0.2500 mg | Freq: Once | INTRAVENOUS | Status: AC
  Administered 2022-10-28: 0.25 mg via INTRAVENOUS
  Filled 2022-10-28: qty 5

## 2022-10-28 NOTE — Patient Instructions (Signed)
[facility]  Discharge Instructions: Thank you for choosing [facility] to provide your oncology and hematology care.  If you have a lab appointment with the Cancer Center - please note that after April 8th, 2024, all labs will be drawn in the cancer center.  You do not have to check in or register with the main entrance as you have in the past but will complete your check-in in the cancer center.  Wear comfortable clothing and clothing appropriate for easy access to any Portacath or PICC line.   We strive to give you quality time with your provider. You may need to reschedule your appointment if you arrive late (15 or more minutes).  Arriving late affects you and other patients whose appointments are after yours.  Also, if you miss three or more appointments without notifying the office, you may be dismissed from the clinic at the provider's discretion.      For prescription refill requests, have your pharmacy contact our office and allow 72 hours for refills to be completed.    Today you received the following chemotherapy and/or immunotherapy agents Bevacizumab. Leucovorin, and 5FU.   To help prevent nausea and vomiting after your treatment, we encourage you to take your nausea medication as directed.   Bevacizumab Injection What is this medication? BEVACIZUMAB (be va SIZ yoo mab) treats some types of cancer. It works by blocking a protein that causes cancer cells to grow and multiply. This helps to slow or stop the spread of cancer cells. It is a monoclonal antibody. This medicine may be used for other purposes; ask your health care provider or pharmacist if you have questions. COMMON BRAND NAME(S): Alymsys, Avastin, MVASI, Omer Jack What should I tell my care team before I take this medication? They need to know if you have any of these conditions: Blood clots Coughing up blood Having or recent surgery Heart failure High blood pressure History of a  connection between 2 or more body parts that do not usually connect (fistula) History of a tear in your stomach or intestines Protein in your urine An unusual or allergic reaction to bevacizumab, other medications, foods, dyes, or preservatives Pregnant or trying to get pregnant Breast-feeding How should I use this medication? This medication is injected into a vein. It is given by your care team in a hospital or clinic setting. Talk to your care team the use of this medication in children. Special care may be needed. Overdosage: If you think you have taken too much of this medicine contact a poison control center or emergency room at once. NOTE: This medicine is only for you. Do not share this medicine with others. What if I miss a dose? Keep appointments for follow-up doses. It is important not to miss your dose. Call your care team if you are unable to keep an appointment. What may interact with this medication? Interactions are not expected. This list may not describe all possible interactions. Give your health care provider a list of all the medicines, herbs, non-prescription drugs, or dietary supplements you use. Also tell them if you smoke, drink alcohol, or use illegal drugs. Some items may interact with your medicine. What should I watch for while using this medication? Your condition will be monitored carefully while you are receiving this medication. You may need blood work while taking this medication. This medication may make you feel generally unwell. This is not uncommon as chemotherapy can affect healthy cells as well as  cancer cells. Report any side effects. Continue your course of treatment even though you feel ill unless your care team tells you to stop. This medication may increase your risk to bruise or bleed. Call your care team if you notice any unusual bleeding. Before having surgery, talk to your care team to make sure it is ok. This medication can increase the risk of  poor healing of your surgical site or wound. You will need to stop this medication for 28 days before surgery. After surgery, wait at least 28 days before restarting this medication. Make sure the surgical site or wound is healed enough before restarting this medication. Talk to your care team if questions. Talk to your care team if you may be pregnant. Serious birth defects can occur if you take this medication during pregnancy and for 6 months after the last dose. Contraception is recommended while taking this medication and for 6 months after the last dose. Your care team can help you find the option that works for you. Do not breastfeed while taking this medication and for 6 months after the last dose. This medication can cause infertility. Talk to your care team if you are concerned about your fertility. What side effects may I notice from receiving this medication? Side effects that you should report to your care team as soon as possible: Allergic reactions--skin rash, itching, hives, swelling of the face, lips, tongue, or throat Bleeding--bloody or black, tar-like stools, vomiting blood or brown material that looks like coffee grounds, red or dark brown urine, small red or purple spots on skin, unusual bruising or bleeding Blood clot--pain, swelling, or warmth in the leg, shortness of breath, chest pain Heart attack--pain or tightness in the chest, shoulders, arms, or jaw, nausea, shortness of breath, cold or clammy skin, feeling faint or lightheaded Heart failure--shortness of breath, swelling of the ankles, feet, or hands, sudden weight gain, unusual weakness or fatigue Increase in blood pressure Infection--fever, chills, cough, sore throat, wounds that don't heal, pain or trouble when passing urine, general feeling of discomfort or being unwell Infusion reactions--chest pain, shortness of breath or trouble breathing, feeling faint or lightheaded Kidney injury--decrease in the amount of urine,  swelling of the ankles, hands, or feet Stomach pain that is severe, does not go away, or gets worse Stroke--sudden numbness or weakness of the face, arm, or leg, trouble speaking, confusion, trouble walking, loss of balance or coordination, dizziness, severe headache, change in vision Sudden and severe headache, confusion, change in vision, seizures, which may be signs of posterior reversible encephalopathy syndrome (PRES) Side effects that usually do not require medical attention (report to your care team if they continue or are bothersome): Back pain Change in taste Diarrhea Dry skin Increased tears Nosebleed This list may not describe all possible side effects. Call your doctor for medical advice about side effects. You may report side effects to FDA at 1-800-FDA-1088. Where should I keep my medication? This medication is given in a hospital or clinic. It will not be stored at home. NOTE: This sheet is a summary. It may not cover all possible information. If you have questions about this medicine, talk to your doctor, pharmacist, or health care provider.  2023 Elsevier/Gold Standard (2021-10-17 00:00:00)  Leucovorin Injection What is this medication? LEUCOVORIN (loo koe VOR in) prevents side effects from certain medications, such as methotrexate. It works by increasing folate levels. This helps protect healthy cells in your body. It may also be used to treat anemia caused  by low levels of folate. It can also be used with fluorouracil, a type of chemotherapy, to treat colorectal cancer. It works by increasing the effects of fluorouracil in the body. This medicine may be used for other purposes; ask your health care provider or pharmacist if you have questions. What should I tell my care team before I take this medication? They need to know if you have any of these conditions: Anemia from low levels of vitamin B12 in the blood An unusual or allergic reaction to leucovorin, folic acid, other  medications, foods, dyes, or preservatives Pregnant or trying to get pregnant Breastfeeding How should I use this medication? This medication is injected into a vein or a muscle. It is given by your care team in a hospital or clinic setting. Talk to your care team about the use of this medication in children. Special care may be needed. Overdosage: If you think you have taken too much of this medicine contact a poison control center or emergency room at once. NOTE: This medicine is only for you. Do not share this medicine with others. What if I miss a dose? Keep appointments for follow-up doses. It is important not to miss your dose. Call your care team if you are unable to keep an appointment. What may interact with this medication? Capecitabine Fluorouracil Phenobarbital Phenytoin Primidone Trimethoprim;sulfamethoxazole This list may not describe all possible interactions. Give your health care provider a list of all the medicines, herbs, non-prescription drugs, or dietary supplements you use. Also tell them if you smoke, drink alcohol, or use illegal drugs. Some items may interact with your medicine. What should I watch for while using this medication? Your condition will be monitored carefully while you are receiving this medication. This medication may increase the side effects of 5-fluorouracil. Tell your care team if you have diarrhea or mouth sores that do not get better or that get worse. What side effects may I notice from receiving this medication? Side effects that you should report to your care team as soon as possible: Allergic reactions--skin rash, itching, hives, swelling of the face, lips, tongue, or throat This list may not describe all possible side effects. Call your doctor for medical advice about side effects. You may report side effects to FDA at 1-800-FDA-1088. Where should I keep my medication? This medication is given in a hospital or clinic. It will not be stored  at home. NOTE: This sheet is a summary. It may not cover all possible information. If you have questions about this medicine, talk to your doctor, pharmacist, or health care provider.  2023 Elsevier/Gold Standard (2021-10-24 00:00:00)  Fluorouracil Injection What is this medication? FLUOROURACIL (flure oh YOOR a sil) treats some types of cancer. It works by slowing down the growth of cancer cells. This medicine may be used for other purposes; ask your health care provider or pharmacist if you have questions. COMMON BRAND NAME(S): Adrucil What should I tell my care team before I take this medication? They need to know if you have any of these conditions: Blood disorders Dihydropyrimidine dehydrogenase (DPD) deficiency Infection, such as chickenpox, cold sores, herpes Kidney disease Liver disease Poor nutrition Recent or ongoing radiation therapy An unusual or allergic reaction to fluorouracil, other medications, foods, dyes, or preservatives If you or your partner are pregnant or trying to get pregnant Breast-feeding How should I use this medication? This medication is injected into a vein. It is administered by your care team in a hospital or clinic setting.  Talk to your care team about the use of this medication in children. Special care may be needed. Overdosage: If you think you have taken too much of this medicine contact a poison control center or emergency room at once. NOTE: This medicine is only for you. Do not share this medicine with others. What if I miss a dose? Keep appointments for follow-up doses. It is important not to miss your dose. Call your care team if you are unable to keep an appointment. What may interact with this medication? Do not take this medication with any of the following: Live virus vaccines This medication may also interact with the following: Medications that treat or prevent blood clots, such as warfarin, enoxaparin, dalteparin This list may not  describe all possible interactions. Give your health care provider a list of all the medicines, herbs, non-prescription drugs, or dietary supplements you use. Also tell them if you smoke, drink alcohol, or use illegal drugs. Some items may interact with your medicine. What should I watch for while using this medication? Your condition will be monitored carefully while you are receiving this medication. This medication may make you feel generally unwell. This is not uncommon as chemotherapy can affect healthy cells as well as cancer cells. Report any side effects. Continue your course of treatment even though you feel ill unless your care team tells you to stop. In some cases, you may be given additional medications to help with side effects. Follow all directions for their use. This medication may increase your risk of getting an infection. Call your care team for advice if you get a fever, chills, sore throat, or other symptoms of a cold or flu. Do not treat yourself. Try to avoid being around people who are sick. This medication may increase your risk to bruise or bleed. Call your care team if you notice any unusual bleeding. Be careful brushing or flossing your teeth or using a toothpick because you may get an infection or bleed more easily. If you have any dental work done, tell your dentist you are receiving this medication. Avoid taking medications that contain aspirin, acetaminophen, ibuprofen, naproxen, or ketoprofen unless instructed by your care team. These medications may hide a fever. Do not treat diarrhea with over the counter products. Contact your care team if you have diarrhea that lasts more than 2 days or if it is severe and watery. This medication can make you more sensitive to the sun. Keep out of the sun. If you cannot avoid being in the sun, wear protective clothing and sunscreen. Do not use sun lamps, tanning beds, or tanning booths. Talk to your care team if you or your partner  wish to become pregnant or think you might be pregnant. This medication can cause serious birth defects if taken during pregnancy and for 3 months after the last dose. A reliable form of contraception is recommended while taking this medication and for 3 months after the last dose. Talk to your care team about effective forms of contraception. Do not father a child while taking this medication and for 3 months after the last dose. Use a condom while having sex during this time period. Do not breastfeed while taking this medication. This medication may cause infertility. Talk to your care team if you are concerned about your fertility. What side effects may I notice from receiving this medication? Side effects that you should report to your care team as soon as possible: Allergic reactions--skin rash, itching, hives, swelling of  the face, lips, tongue, or throat Heart attack--pain or tightness in the chest, shoulders, arms, or jaw, nausea, shortness of breath, cold or clammy skin, feeling faint or lightheaded Heart failure--shortness of breath, swelling of the ankles, feet, or hands, sudden weight gain, unusual weakness or fatigue Heart rhythm changes--fast or irregular heartbeat, dizziness, feeling faint or lightheaded, chest pain, trouble breathing High ammonia level--unusual weakness or fatigue, confusion, loss of appetite, nausea, vomiting, seizures Infection--fever, chills, cough, sore throat, wounds that don't heal, pain or trouble when passing urine, general feeling of discomfort or being unwell Low red blood cell level--unusual weakness or fatigue, dizziness, headache, trouble breathing Pain, tingling, or numbness in the hands or feet, muscle weakness, change in vision, confusion or trouble speaking, loss of balance or coordination, trouble walking, seizures Redness, swelling, and blistering of the skin over hands and feet Severe or prolonged diarrhea Unusual bruising or bleeding Side effects  that usually do not require medical attention (report to your care team if they continue or are bothersome): Dry skin Headache Increased tears Nausea Pain, redness, or swelling with sores inside the mouth or throat Sensitivity to light Vomiting This list may not describe all possible side effects. Call your doctor for medical advice about side effects. You may report side effects to FDA at 1-800-FDA-1088. Where should I keep my medication? This medication is given in a hospital or clinic. It will not be stored at home. NOTE: This sheet is a summary. It may not cover all possible information. If you have questions about this medicine, talk to your doctor, pharmacist, or health care provider.  2023 Elsevier/Gold Standard (2021-10-14 00:00:00)   BELOW ARE SYMPTOMS THAT SHOULD BE REPORTED IMMEDIATELY: *FEVER GREATER THAN 100.4 F (38 C) OR HIGHER *CHILLS OR SWEATING *NAUSEA AND VOMITING THAT IS NOT CONTROLLED WITH YOUR NAUSEA MEDICATION *UNUSUAL SHORTNESS OF BREATH *UNUSUAL BRUISING OR BLEEDING *URINARY PROBLEMS (pain or burning when urinating, or frequent urination) *BOWEL PROBLEMS (unusual diarrhea, constipation, pain near the anus) TENDERNESS IN MOUTH AND THROAT WITH OR WITHOUT PRESENCE OF ULCERS (sore throat, sores in mouth, or a toothache) UNUSUAL RASH, SWELLING OR PAIN  UNUSUAL VAGINAL DISCHARGE OR ITCHING   Items with * indicate a potential emergency and should be followed up as soon as possible or go to the Emergency Department if any problems should occur.  Please show the CHEMOTHERAPY ALERT CARD or IMMUNOTHERAPY ALERT CARD at check-in to the Emergency Department and triage nurse.  Should you have questions after your visit or need to cancel or reschedule your appointment, please contact [facility] [phone]  and follow the prompts.  Office hours are 8:00 a.m. to 4:30 p.m. Monday - Friday. Please note that voicemails left after 4:00 p.m. may not be  returned until the following business day.  We are closed weekends and major holidays. You have access to a nurse at all times for urgent questions. Please call the main number to the clinic [phone] and follow the prompts.  For any non-urgent questions, you may also contact your provider using MyChart. We now offer e-Visits for anyone 61 and older to request care online for non-urgent symptoms. For details visit [email or web address].   Also download the MyChart app! Go to the app store, search "MyChart", open the app, select [place], and log in with your MyChart username and password.

## 2022-10-28 NOTE — Progress Notes (Signed)
Patient presents today for Vegzelma, Leucovorin, and 5FU infusion. Patient is in satisfactory condition with no new complaints voiced.  Vital signs are stable.  Labs reviewed and other labs are within treatment parameters. Pt's UA has protein of 100, Dr.K made aware. Okay to proceed with treatment today per Dr.K.  Pt's potassium is 3.1 today, pt will receive 40 mEq potassium chloride p.o x 1 dose per Dr.K's standing order. We will proceed with treatment per MD orders.    Treatment given today per MD orders. Tolerated infusion without adverse affects. Vital signs stable. No complaints at this time. Discharged from clinic ambulatory in stable condition. Alert and oriented x 3. F/U with [facility] as scheduled. 5FU ambulatory pump infusing.

## 2022-10-28 NOTE — Addendum Note (Signed)
Addended by: Virgina Organ on: 10/28/2022 12:29 PM   Modules accepted: Orders

## 2022-10-29 ENCOUNTER — Other Ambulatory Visit: Payer: Self-pay | Admitting: Hematology

## 2022-10-30 ENCOUNTER — Inpatient Hospital Stay: Payer: No Typology Code available for payment source

## 2022-10-30 VITALS — BP 153/78 | HR 84 | Temp 98.1°F | Resp 18

## 2022-10-30 DIAGNOSIS — Z5112 Encounter for antineoplastic immunotherapy: Secondary | ICD-10-CM | POA: Diagnosis not present

## 2022-10-30 DIAGNOSIS — C189 Malignant neoplasm of colon, unspecified: Secondary | ICD-10-CM

## 2022-10-30 MED ORDER — SODIUM CHLORIDE 0.9% FLUSH
10.0000 mL | INTRAVENOUS | Status: DC | PRN
  Administered 2022-10-30: 10 mL

## 2022-10-30 MED ORDER — HEPARIN SOD (PORK) LOCK FLUSH 100 UNIT/ML IV SOLN
500.0000 [IU] | Freq: Once | INTRAVENOUS | Status: AC | PRN
  Administered 2022-10-30: 500 [IU]

## 2022-10-30 NOTE — Progress Notes (Signed)
Patient presents today for 5FU pump stop and disconnection after 46 hour continous infusion.   5FU pump deaccessed.  Patients port flushed without difficulty.  Good blood return noted with no bruising or swelling noted at site.  Needle removed intact.  Band aid applied.  VSS with discharge and left in satisfactory condition via wheelchair with no s/s of distress noted.    

## 2022-10-30 NOTE — Patient Instructions (Signed)
[facility]  Discharge Instructions: Thank you for choosing [facility] to provide your oncology and hematology care.  If you have a lab appointment with the Cancer Center - please note that after April 8th, 2024, all labs will be drawn in the cancer center.  You do not have to check in or register with the main entrance as you have in the past but will complete your check-in in the cancer center.  Wear comfortable clothing and clothing appropriate for easy access to any Portacath or PICC line.   We strive to give you quality time with your provider. You may need to reschedule your appointment if you arrive late (15 or more minutes).  Arriving late affects you and other patients whose appointments are after yours.  Also, if you miss three or more appointments without notifying the office, you may be dismissed from the clinic at the provider's discretion.      For prescription refill requests, have your pharmacy contact our office and allow 72 hours for refills to be completed.    Today you received the following chemotherapy and/or immunotherapy agents 5FU pump stop      To help prevent nausea and vomiting after your treatment, we encourage you to take your nausea medication as directed.  BELOW ARE SYMPTOMS THAT SHOULD BE REPORTED IMMEDIATELY: *FEVER GREATER THAN 100.4 F (38 C) OR HIGHER *CHILLS OR SWEATING *NAUSEA AND VOMITING THAT IS NOT CONTROLLED WITH YOUR NAUSEA MEDICATION *UNUSUAL SHORTNESS OF BREATH *UNUSUAL BRUISING OR BLEEDING *URINARY PROBLEMS (pain or burning when urinating, or frequent urination) *BOWEL PROBLEMS (unusual diarrhea, constipation, pain near the anus) TENDERNESS IN MOUTH AND THROAT WITH OR WITHOUT PRESENCE OF ULCERS (sore throat, sores in mouth, or a toothache) UNUSUAL RASH, SWELLING OR PAIN  UNUSUAL VAGINAL DISCHARGE OR ITCHING   Items with * indicate a potential emergency and should be followed up as soon as possible or go to  the Emergency Department if any problems should occur.  Please show the CHEMOTHERAPY ALERT CARD or IMMUNOTHERAPY ALERT CARD at check-in to the Emergency Department and triage nurse.  Should you have questions after your visit or need to cancel or reschedule your appointment, please contact [facility] [phone]  and follow the prompts.  Office hours are 8:00 a.m. to 4:30 p.m. Monday - Friday. Please note that voicemails left after 4:00 p.m. may not be returned until the following business day.  We are closed weekends and major holidays. You have access to a nurse at all times for urgent questions. Please call the main number to the clinic [phone] and follow the prompts.  For any non-urgent questions, you may also contact your provider using MyChart. We now offer e-Visits for anyone 71 and older to request care online for non-urgent symptoms. For details visit [email or web address].   Also download the MyChart app! Go to the app store, search "MyChart", open the app, select [place], and log in with your MyChart username and password.

## 2022-11-06 ENCOUNTER — Encounter: Payer: Self-pay | Admitting: Hematology

## 2022-11-06 ENCOUNTER — Ambulatory Visit (HOSPITAL_COMMUNITY)
Admission: RE | Admit: 2022-11-06 | Discharge: 2022-11-06 | Disposition: A | Discharge status: Home / Self Care | Payer: No Typology Code available for payment source | Source: Ambulatory Visit | Attending: Hematology | Admitting: Hematology

## 2022-11-06 DIAGNOSIS — C189 Malignant neoplasm of colon, unspecified: Secondary | ICD-10-CM | POA: Insufficient documentation

## 2022-11-06 DIAGNOSIS — C787 Secondary malignant neoplasm of liver and intrahepatic bile duct: Secondary | ICD-10-CM | POA: Insufficient documentation

## 2022-11-06 MED ORDER — HEPARIN SOD (PORK) LOCK FLUSH 100 UNIT/ML IV SOLN
500.0000 [IU] | Freq: Once | INTRAVENOUS | Status: AC
  Administered 2022-11-06: 500 [IU] via INTRAVENOUS

## 2022-11-06 MED ORDER — IOHEXOL 300 MG/ML  SOLN
100.0000 mL | Freq: Once | INTRAMUSCULAR | Status: AC | PRN
  Administered 2022-11-06: 100 mL via INTRAVENOUS

## 2022-11-06 MED ORDER — IOHEXOL 9 MG/ML PO SOLN
ORAL | Status: AC
  Filled 2022-11-06: qty 1000

## 2022-11-06 MED ORDER — HEPARIN SOD (PORK) LOCK FLUSH 100 UNIT/ML IV SOLN
INTRAVENOUS | Status: AC
  Filled 2022-11-06: qty 5

## 2022-11-10 NOTE — Progress Notes (Signed)
[facility] [address], [place] [address]    Clinic Day:  11/11/2022  Referring physician: [name], MD  Patient Care Team: Patient, No Pcp Per as PCP - General (General Practice) Therese Sarah, RN as Oncology Nurse Navigator (Oncology)   ASSESSMENT & PLAN:   Assessment: 1.  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 -CT chest on 05/02/2020 with no evidence of pulmonary metastatic disease. -CTAP on 05/07/2020 with multiple lesions throughout the liver, largest in the right lobe measuring 3.9 cm, lateral segment left lobe measuring 2.6 cm and inferior right lobe confluent lesion 4.2 x 3.7 cm.  No lymphadenopathy. -CEA on 05/02/2020-60.2. -FOLFOX and bevacizumab started on 06/04/2020. 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   2.  Iron deficiency anemia: -Feraheme on 04/30/2020 and 05/06/2020.   3.  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 -Smoked for 3 to 4 years and quit. -Mother had cancer, type unknown.  Maternal uncle had lung cancer.  Maternal aunt had lung cancer.    Plan: 1.  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 - Reviewed labs today: AST mildly elevated at 42.  Other LFTs are normal.  CBC normal with mild thrombocytopenia.  CEA is 4.2. - CT CAP on 11/06/2022: Stable disease. - Recommend continue maintenance therapy every 2 weeks.  RTC 6 weeks for follow-up.   2.  Hypertension: - She is taking triamterene HCTZ and Norvasc 5 mg daily.  Blood pressure is elevated. - Will increase Norvasc to 10 mg daily.   3.  Severe  hypokalemia: - Continue potassium 10 mEq 2 tablets daily.  Potassium is 3.1.   4.  Peripheral neuropathy: - Continue gabapentin 100 mg 3 times daily.   5.  Anxiety: - Continue Klonopin 1 mg 3 times daily and Paxil 40 mg daily.  6.  Mucositis: - Continue Vaseline and lidocaine gel as needed.    No orders of the defined types were placed in this encounter.     I,Katie Daubenspeck,acting as a Neurosurgeon for [name], MD.,have documented all relevant documentation on the behalf of [name], MD,as directed by  [name], MD while in the presence of [name], MD.   I, [name] MD, have reviewed the above documentation for accuracy and completeness, and I agree with the above.   [name], MD   5/15/20245:41 PM  CHIEF COMPLAINT:   Diagnosis: metastatic colon cancer to liver    Cancer Staging  No matching staging information was found for the patient.   Prior Therapy: 1. Sigmoid colectomy and end colostomy on 04/29/2020  2. FOLFOX with bevacizumab, 06/04/20 - 11/06/20  Current Therapy:  Maintenance 5-FU and bevacizumab    HISTORY OF PRESENT ILLNESS:   Oncology History  Metastatic colon cancer to liver (HCC)  05/16/2020 Initial Diagnosis   Metastatic colon cancer to liver (HCC)   06/03/2020 Genetic Testing   Foundation One:     06/04/2020 - 02/25/2022 Chemotherapy   Patient is on Treatment Plan : COLORECTAL FOLFOX + Bevacizumab q14d     06/04/2020 -  Chemotherapy   Patient is on Treatment Plan : COLORECTAL FOLFOX + Bevacizumab q14d  INTERVAL HISTORY:   Susan Martin is a 62 y.o. female presenting to clinic today for follow up of metastatic colon cancer to liver. She was last seen by me on 09/16/22.  Since her last visit, she underwent restaging CT C/A/P on 11/06/22 showing: improving opacities in lungs and decreasing size right hilar lymph node, may have been infectious or inflammatory; stable liver lesions, no  new mass lesion.  Today, she states that she is doing well overall. Her appetite level is at 100%. Her energy level is at 75%.  PAST MEDICAL HISTORY:   Past Medical History: Past Medical History:  Diagnosis Date   Adenocarcinoma of colon metastatic to liver [facility]) onocology--- dr s. Ellin Saba   04-29-2020 emergerency surgery for perforated colon s/p sigmoid colectomy w/ colostomy; dx  Stage IV colon cancer mets to liver   Anxiety    Depression    Hypertension    followed by dr Emelda Fear and oncology  (05-27-2020 per pt does not have pcp yet)   IDA (iron deficiency anemia)    Pulmonary nodule, right     Surgical History: Past Surgical History:  Procedure Laterality Date   ABDOMINAL HYSTERECTOMY  03-31-2016   @[facility]    W/  BILATERAL SALPINOOPHORECTOMY    APPLICATION OF WOUND VAC N/A 04/29/2020   Procedure: APPLICATION OF WOUND VAC;  Surgeon: Rodman Pickle, MD;  Location: [facility] OR;  Service: General;  Laterality: N/A;   ENDOMETRIAL ABLATION     LAPAROTOMY N/A 04/29/2020   Procedure: EXPLORATORY LAPAROTOMY;  Surgeon: Rodman Pickle, MD;  Location: [facility] OR;  Service: General;  Laterality: N/A;   PARTIAL COLECTOMY N/A 04/29/2020   Procedure: PARTIAL COLECTOMY WITH END COLOSTOMY;  Surgeon: Sheliah Hatch De Blanch, MD;  Location: [facility] OR;  Service: General;  Laterality: N/A;   PORTACATH PLACEMENT Right 05/28/2020   Procedure: INSERTION PORT-A-CATH;  Surgeon: Kinsinger, De Blanch, MD;  [facility];  Service: General;  Laterality: Right;   SALPINGOOPHORECTOMY Left 03/31/2016   Procedure: LEFT SALPINGO OOPHORECTOMY WITH FROZEN SECTION,  ABDOMINAL HYSTERECTOMY WITH BILATERAL SALPINGO-OOPHORECTOMY;  Surgeon: Tilda Burrow, MD;  Location: [facility];  Service: Gynecology;  Laterality: Left;    Social History: Social History   Socioeconomic History   Marital status: Divorced    Spouse name: Not on file   Number of children: Not on file   Years of education: Not on  file   Highest education level: Not on file  Occupational History   Not on file  Tobacco Use   Smoking status: Former    Packs/day: 0.50    Years: 5.00    Additional pack years: 0.00    Total pack years: 2.50    Types: Cigarettes    Quit date: 03/28/2015    Years since quitting: 7.6   Smokeless tobacco: Never  Vaping Use   Vaping Use: Never used  Substance and Sexual Activity   Alcohol use: No   Drug use: Never   Sexual activity: Yes    Partners: Male    Birth control/protection: Surgical  Other Topics Concern   Not on file  Social History Narrative   Not on file   Social Determinants of Health   Financial Resource Strain: Low Risk  (11/01/2019)   Overall Financial Resource Strain (CARDIA)    Difficulty of Paying Living Expenses: Not very hard  Food Insecurity: No Food Insecurity (11/01/2019)   Hunger Vital Sign    Worried About Running Out of Food in the Last Year: Never true  Ran Out of Food in the Last Year: Never true  Transportation Needs: No Transportation Needs (11/01/2019)   PRAPARE - Administrator, Civil Service (Medical): No    Lack of Transportation (Non-Medical): No  Physical Activity: Insufficiently Active (11/01/2019)   Exercise Vital Sign    Days of Exercise per Week: 2 days    Minutes of Exercise per Session: 30 min  Stress: Stress Concern Present (11/01/2019)   [employer] of Occupational Health - Occupational Stress Questionnaire    Feeling of Stress : To some extent  Social Connections: Moderately Integrated (11/01/2019)   Social Connection and Isolation Panel [NHANES]    Frequency of Communication with Friends and Family: More than three times a week    Frequency of Social Gatherings with Friends and Family: Once a week    Attends Religious Services: More than 4 times per year    Active Member of [employer] or Organizations: No    Attends Banker Meetings: Never    Marital Status: Living with partner  Intimate Partner Violence:  Not At Risk (11/01/2019)   Humiliation, Afraid, Rape, and Kick questionnaire    Fear of Current or Ex-Partner: No    Emotionally Abused: No    Physically Abused: No    Sexually Abused: No    Family History: Family History  Problem Relation Age of Onset   Hypertension Mother    Diabetes Mother    Cirrhosis Father     Current Medications:  Current Outpatient Medications:    BEVACIZUMAB IV, Inject 5 mg/kg into the vein every 14 (fourteen) days., Disp: , Rfl:    clonazePAM (KLONOPIN) 1 MG tablet, Take 1 tablet (1 mg total) by mouth in the morning, at noon, and at bedtime., Disp: 90 tablet, Rfl: 3   fluorouracil CALGB 69629 in sodium chloride 0.9 % 150 mL, Inject 2,400 mg/m2 into the vein over 48 hr., Disp: , Rfl:    gabapentin (NEURONTIN) 100 MG capsule, TAKE 1 CAPSULE (100 MG TOTAL) BY MOUTH THREE TIMES DAILY., Disp: 90 capsule, Rfl: 3   LEUCOVORIN CALCIUM IV, Inject 400 mg/m2 into the vein every 21 ( twenty-one) days., Disp: , Rfl:    lidocaine (XYLOCAINE) 2 % solution, Use as directed 15 mLs in the mouth or throat every 6 (six) hours as needed for mouth pain. Swish and spit/swallow every six hours as needed for mouth pain, Disp: 480 mL, Rfl: 3   nystatin (MYCOSTATIN) 100000 UNIT/ML suspension, Take by mouth., Disp: , Rfl:    PARoxetine (PAXIL) 40 MG tablet, Take 1 tablet (40 mg total) by mouth every morning., Disp: 90 tablet, Rfl: 3   polyethylene glycol (MIRALAX / GLYCOLAX) packet, Take 17 g by mouth every other day., Disp: , Rfl:    potassium chloride (KLOR-CON M10) 10 MEQ tablet, Take 2 tablets (20 mEq total) by mouth daily., Disp: 180 tablet, Rfl: 3   triamterene-hydrochlorothiazide (MAXZIDE-25) 37.5-25 MG tablet, TAKE 1 TABLET BY MOUTH EVERY DAY, Disp: 90 tablet, Rfl: 1   amLODipine (NORVASC) 10 MG tablet, Take 1 tablet (10 mg total) by mouth daily., Disp: 30 tablet, Rfl: 6 No current facility-administered medications for this visit.  Facility-Administered Medications Ordered in  Other Visits:    fluorouracil (ADRUCIL) 3,250 mg in sodium chloride 0.9 % 85 mL chemo infusion, 3,250 mg, Intravenous, 1 day or 1 dose, [name], MD, Infusion Verify at 11/11/22 1324   Allergies: No Known Allergies  REVIEW OF SYSTEMS:   Review of Systems  Constitutional:  Negative for chills, fatigue and fever.  HENT:   Positive for mouth sores. Negative for lump/mass, nosebleeds, sore throat and trouble swallowing.   Eyes:  Negative for eye problems.  Respiratory:  Negative for cough and shortness of breath.   Cardiovascular:  Negative for chest pain, leg swelling and palpitations.  Gastrointestinal:  Negative for abdominal pain, constipation, diarrhea, nausea and vomiting.  Genitourinary:  Negative for bladder incontinence, difficulty urinating, dysuria, frequency, hematuria and nocturia.   Musculoskeletal:  Negative for arthralgias, back pain, flank pain, myalgias and neck pain.  Skin:  Negative for itching and rash.  Neurological:  Positive for numbness. Negative for dizziness and headaches.  Hematological:  Does not bruise/bleed easily.  Psychiatric/Behavioral:  Positive for sleep disturbance. Negative for depression and suicidal ideas. The patient is not nervous/anxious.   All other systems reviewed and are negative.    VITALS:   There were no vitals taken for this visit.  Wt Readings from Last 3 Encounters:  11/11/22 186 lb (84.4 kg)  10/28/22 182 lb 9.6 oz (82.8 kg)  10/14/22 183 lb 6.4 oz (83.2 kg)    There is no height or weight on file to calculate BMI.  Performance status (ECOG): 1 - Symptomatic but completely ambulatory  PHYSICAL EXAM:   Physical Exam Vitals and nursing note reviewed. Exam conducted with a chaperone present.  Constitutional:      Appearance: Normal appearance.  Cardiovascular:     Rate and Rhythm: Normal rate and regular rhythm.     Pulses: Normal pulses.     Heart sounds: Normal heart sounds.  Pulmonary:     Effort: Pulmonary  effort is normal.     Breath sounds: Normal breath sounds.  Abdominal:     Palpations: Abdomen is soft. There is no hepatomegaly, splenomegaly or mass.     Tenderness: There is no abdominal tenderness.  Musculoskeletal:     Right lower leg: No edema.     Left lower leg: No edema.  Lymphadenopathy:     Cervical: No cervical adenopathy.     Right cervical: No superficial, deep or posterior cervical adenopathy.    Left cervical: No superficial, deep or posterior cervical adenopathy.     Upper Body:     Right upper body: No supraclavicular or axillary adenopathy.     Left upper body: No supraclavicular or axillary adenopathy.  Neurological:     General: No focal deficit present.     Mental Status: She is alert and oriented to person, place, and time.  Psychiatric:        Mood and Affect: Mood normal.        Behavior: Behavior normal.     LABS:      Latest Ref Rng & Units 11/11/2022    8:53 AM 10/28/2022    9:04 AM 10/14/2022   10:05 AM  CBC  WBC 4.0 - 10.5 K/uL 5.6  7.3  6.4   Hemoglobin 12.0 - 15.0 g/dL 16.1  09.6  04.5   Hematocrit 36.0 - 46.0 % 39.5  44.1  40.7   Platelets 150 - 400 K/uL 113  132  115       Latest Ref Rng & Units 11/11/2022    8:53 AM 10/28/2022    9:04 AM 10/14/2022   10:05 AM  CMP  Glucose 70 - 99 mg/dL 409  811  914   BUN 8 - 23 mg/dL 8  13  9    Creatinine 0.44 - 1.00 mg/dL 7.82  9.56  0.99   Sodium 135 - 145 mmol/L 133  133  133   Potassium 3.5 - 5.1 mmol/L 3.1  3.1  3.3   Chloride 98 - 111 mmol/L 97  97  97   CO2 22 - 32 mmol/L 25  25  24    Calcium 8.9 - 10.3 mg/dL 8.7  9.1  8.7   Total Protein 6.5 - 8.1 g/dL 7.0  7.4  7.1   Total Bilirubin 0.3 - 1.2 mg/dL 1.1  1.3  1.3   Alkaline Phos 38 - 126 U/L 99  112  92   AST 15 - 41 U/L 42  38  40   ALT 0 - 44 U/L 40  36  41      Lab Results  Component Value Date   CEA1 4.2 09/16/2022   /  CEA  Date Value Ref Range Status  09/16/2022 4.2 0.0 - 4.7 ng/mL Final    Comment:    (NOTE)                              Nonsmokers          <3.9                             Smokers             <5.6 Roche [employer] Electrochemiluminescence Immunoassay (ECLIA) Values obtained with different assay methods or kits cannot be used interchangeably.  Results cannot be interpreted as absolute evidence of the presence or absence of malignant disease. Performed At: [facility] [address] [place], [place] [id number] Jolene Schimke MD WU:[id number]    No results found for: "PSA1" No results found for: "CAN199" No results found for: "CAN125"  No results found for: "TOTALPROTELP", "ALBUMINELP", "A1GS", "A2GS", "BETS", "BETA2SER", "GAMS", "MSPIKE", "SPEI" Lab Results  Component Value Date   TIBC 248 (L) 04/29/2020   FERRITIN 57 04/29/2020   IRONPCTSAT 3 (L) 04/29/2020   No results found for: "LDH"   STUDIES:   CT CHEST ABDOMEN PELVIS W CONTRAST  Result Date: 11/10/2022 CLINICAL DATA:  Metastatic colon cancer to liver. Colostomy. Ongoing chemotherapy. * Tracking Code: BO * EXAM: CT CHEST, ABDOMEN, AND PELVIS WITH CONTRAST TECHNIQUE: Multidetector CT imaging of the chest, abdomen and pelvis was performed following the standard protocol during bolus administration of intravenous contrast. RADIATION DOSE REDUCTION: This exam was performed according to the departmental dose-optimization program which includes automated exposure control, adjustment of the mA and/or kV according to patient size and/or use of iterative reconstruction technique. CONTRAST:  OMNIPAQUE IOHEXOL 300 MG/ML  SOLN COMPARISON:  CT 08/13/2022 and older FINDINGS: CT CHEST FINDINGS Cardiovascular: Heart is nonenlarged. No pericardial effusion. The thoracic aorta is normal course and caliber with mild atherosclerotic plaque. Coronary artery calcifications are seen. Please correlate for other coronary risk factors. Right upper chest port in place. The port is accessed. Mediastinum/Nodes: No specific abnormal lymph node  enlargement identified in the axillary region, hilum or mediastinum. The previous small right hilar node is not well seen today. Small cyst in the left thyroid lobe, unchanged from previous. No specific imaging follow-up. Normal caliber thoracic esophagus. Lungs/Pleura: There is some linear opacity lung bases likely scar or atelectasis. No consolidation. Stable middle lobe nodule on series 3, image 98 measuring 4 mm. Previously there was some patchy areas of ground-glass noted in the lungs bilaterally which has  significantly improved. This may have been infectious or inflammatory. No pneumothorax or effusion. Musculoskeletal: Scattered degenerative changes seen along the spine. There is some curvature of the spine as well. CT ABDOMEN PELVIS FINDINGS Hepatobiliary: Fatty liver infiltration. Patent portal vein. Gallbladder is nondilated. Again there are some faint hepatic lesions identified. Previous lesion which measured in the right hepatic lobe at 19 x 14 mm, today on series 2, image 59 measures 20 by 20 mm, similar. Other faint lesions are grossly similar as well. This includes the segment 5 lesion abutting the gallbladder fossa Pancreas: Unremarkable. No pancreatic ductal dilatation or surrounding inflammatory changes. There is some encasement of second portion duodenal by what appears to be pancreatic tissue. Please correlate for congenital variant of an annular pancreas. Spleen: Slightly enlarged without focal abnormality.  Small splenule Adrenals/Urinary Tract: Adrenal glands are unremarkable. Kidneys are normal, without renal calculi, focal lesion, or hydronephrosis. Bladder is unremarkable. Stomach/Bowel: Again surgical changes of a left-sided colostomy with rectal stump. No obstruction. Scattered stool. Few diverticula. Stomach and small bowel are nondilated. Vascular/Lymphatic: Aortic atherosclerosis. No enlarged abdominal or pelvic lymph nodes. Reproductive: Status post hysterectomy. No adnexal masses.  Other: No free air or free fluid. Slight fat herniation along the ostomy. Rectus muscle diastasis. Persistent scattered mesenteric nodular haziness such as axial image 90 on the left than on the right image 84. Recommend attention on follow-up. Musculoskeletal: Scattered degenerative changes of the spine and pelvis. IMPRESSION: Improving patchy ground-glass opacities in the lungs and decreasing size right hilar lymph node. This may have been infectious or inflammatory. Stable liver lesions. No new mass lesion. Fatty liver infiltration. Mild splenomegaly. Surgical changes from left-sided colostomy with rectosigmoid stump. Stable anterior mesenteric haziness. Recommend attention on follow-up to exclude subtle carcinomatosis. Again no ascites. Electronically Signed   By: Karen Kays M.D.   On: 11/10/2022 15:01

## 2022-11-11 ENCOUNTER — Inpatient Hospital Stay: Payer: No Typology Code available for payment source

## 2022-11-11 ENCOUNTER — Inpatient Hospital Stay (HOSPITAL_BASED_OUTPATIENT_CLINIC_OR_DEPARTMENT_OTHER): Payer: No Typology Code available for payment source | Admitting: Hematology

## 2022-11-11 ENCOUNTER — Encounter: Payer: Self-pay | Admitting: Hematology

## 2022-11-11 VITALS — BP 112/75 | HR 75 | Temp 98.5°F | Resp 18

## 2022-11-11 DIAGNOSIS — C189 Malignant neoplasm of colon, unspecified: Secondary | ICD-10-CM

## 2022-11-11 DIAGNOSIS — C787 Secondary malignant neoplasm of liver and intrahepatic bile duct: Secondary | ICD-10-CM

## 2022-11-11 DIAGNOSIS — Z5112 Encounter for antineoplastic immunotherapy: Secondary | ICD-10-CM | POA: Diagnosis not present

## 2022-11-11 LAB — COMPREHENSIVE METABOLIC PANEL WITH GFR
ALT: 40 U/L (ref 0–44)
AST: 42 U/L — ABNORMAL HIGH (ref 15–41)
Albumin: 3.5 g/dL (ref 3.5–5.0)
Alkaline Phosphatase: 99 U/L (ref 38–126)
Anion gap: 11 (ref 5–15)
BUN: 8 mg/dL (ref 8–23)
CO2: 25 mmol/L (ref 22–32)
Calcium: 8.7 mg/dL — ABNORMAL LOW (ref 8.9–10.3)
Chloride: 97 mmol/L — ABNORMAL LOW (ref 98–111)
Creatinine, Ser: 0.94 mg/dL (ref 0.44–1.00)
GFR, Estimated: >60 mL/min
Glucose, Bld: 147 mg/dL — ABNORMAL HIGH (ref 70–99)
Potassium: 3.1 mmol/L — ABNORMAL LOW (ref 3.5–5.1)
Sodium: 133 mmol/L — ABNORMAL LOW (ref 135–145)
Total Bilirubin: 1.1 mg/dL (ref 0.3–1.2)
Total Protein: 7 g/dL (ref 6.5–8.1)

## 2022-11-11 LAB — CBC WITH DIFFERENTIAL/PLATELET
Abs Immature Granulocytes: 0.03 10*3/uL (ref 0.00–0.07)
Basophils Absolute: 0 10*3/uL (ref 0.0–0.1)
Basophils Relative: 1 %
Eosinophils Absolute: 0.2 10*3/uL (ref 0.0–0.5)
Eosinophils Relative: 3 %
HCT: 39.5 % (ref 36.0–46.0)
Hemoglobin: 13.5 g/dL (ref 12.0–15.0)
Immature Granulocytes: 1 %
Lymphocytes Relative: 23 %
Lymphs Abs: 1.3 10*3/uL (ref 0.7–4.0)
MCH: 32.5 pg (ref 26.0–34.0)
MCHC: 34.2 g/dL (ref 30.0–36.0)
MCV: 95 fL (ref 80.0–100.0)
Monocytes Absolute: 0.7 10*3/uL (ref 0.1–1.0)
Monocytes Relative: 12 %
Neutro Abs: 3.4 10*3/uL (ref 1.7–7.7)
Neutrophils Relative %: 60 %
Platelets: 113 10*3/uL — ABNORMAL LOW (ref 150–400)
RBC: 4.16 MIL/uL (ref 3.87–5.11)
RDW: 16.2 % — ABNORMAL HIGH (ref 11.5–15.5)
WBC: 5.6 10*3/uL (ref 4.0–10.5)
nRBC: 0 % (ref 0.0–0.2)

## 2022-11-11 LAB — MAGNESIUM: Magnesium: 1.8 mg/dL (ref 1.7–2.4)

## 2022-11-11 MED ORDER — CLONIDINE HCL 0.1 MG PO TABS
0.2000 mg | ORAL_TABLET | Freq: Once | ORAL | Status: AC
  Administered 2022-11-11: 0.2 mg via ORAL
  Filled 2022-11-11: qty 2

## 2022-11-11 MED ORDER — POTASSIUM CHLORIDE CRYS ER 20 MEQ PO TBCR
40.0000 meq | EXTENDED_RELEASE_TABLET | Freq: Once | ORAL | Status: AC
  Administered 2022-11-11: 40 meq via ORAL
  Filled 2022-11-11: qty 2

## 2022-11-11 MED ORDER — SODIUM CHLORIDE 0.9% FLUSH
10.0000 mL | INTRAVENOUS | Status: DC | PRN
  Administered 2022-11-11: 10 mL via INTRAVENOUS

## 2022-11-11 MED ORDER — SODIUM CHLORIDE 0.9 % IV SOLN
10.0000 mg | Freq: Once | INTRAVENOUS | Status: AC
  Administered 2022-11-11: 10 mg via INTRAVENOUS
  Filled 2022-11-11: qty 1

## 2022-11-11 MED ORDER — FLUOROURACIL CHEMO INJECTION 500 MG/10ML
270.0000 mg/m2 | Freq: Once | INTRAVENOUS | Status: AC
  Administered 2022-11-11: 500 mg via INTRAVENOUS
  Filled 2022-11-11: qty 10

## 2022-11-11 MED ORDER — SODIUM CHLORIDE 0.9 % IV SOLN
5.0000 mg/kg | Freq: Once | INTRAVENOUS | Status: AC
  Administered 2022-11-11: 400 mg via INTRAVENOUS
  Filled 2022-11-11: qty 16

## 2022-11-11 MED ORDER — AMLODIPINE BESYLATE 10 MG PO TABS: 10.0000 mg | ORAL_TABLET | Freq: Every day | ORAL | 6 refills | Status: DC

## 2022-11-11 MED ORDER — SODIUM CHLORIDE 0.9 % IV SOLN: Freq: Once | INTRAVENOUS | Status: AC

## 2022-11-11 MED ORDER — PALONOSETRON HCL INJECTION 0.25 MG/5ML
0.2500 mg | Freq: Once | INTRAVENOUS | Status: AC
  Administered 2022-11-11: 0.25 mg via INTRAVENOUS
  Filled 2022-11-11: qty 5

## 2022-11-11 MED ORDER — LEUCOVORIN CALCIUM INJECTION 350 MG
320.0000 mg/m2 | Freq: Once | INTRAVENOUS | Status: AC
  Administered 2022-11-11: 628 mg via INTRAVENOUS
  Filled 2022-11-11: qty 31.4

## 2022-11-11 MED ORDER — SODIUM CHLORIDE 0.9 % IV SOLN
3250.0000 mg | INTRAVENOUS | Status: DC
  Administered 2022-11-11: 3250 mg via INTRAVENOUS
  Filled 2022-11-11: qty 65

## 2022-11-11 NOTE — Patient Instructions (Signed)
[facility]  Discharge Instructions: Thank you for choosing [facility] to provide your oncology and hematology care.  If you have a lab appointment with the Cancer Center - please note that after April 8th, 2024, all labs will be drawn in the cancer center.  You do not have to check in or register with the main entrance as you have in the past but will complete your check-in in the cancer center.  Wear comfortable clothing and clothing appropriate for easy access to any Portacath or PICC line.   We strive to give you quality time with your provider. You may need to reschedule your appointment if you arrive late (15 or more minutes).  Arriving late affects you and other patients whose appointments are after yours.  Also, if you miss three or more appointments without notifying the office, you may be dismissed from the clinic at the provider's discretion.      For prescription refill requests, have your pharmacy contact our office and allow 72 hours for refills to be completed.    Today you received the following chemotherapy and/or immunotherapy agents Bevacizumab, Leucovorin, and 5FU.   To help prevent nausea and vomiting after your treatment, we encourage you to take your nausea medication as directed.  Bevacizumab Injection What is this medication? BEVACIZUMAB (be va SIZ yoo mab) treats some types of cancer. It works by blocking a protein that causes cancer cells to grow and multiply. This helps to slow or stop the spread of cancer cells. It is a monoclonal antibody. This medicine may be used for other purposes; ask your health care provider or pharmacist if you have questions. COMMON BRAND NAME(S): Alymsys, Avastin, MVASI, Omer Jack What should I tell my care team before I take this medication? They need to know if you have any of these conditions: Blood clots Coughing up blood Having or recent surgery Heart failure High blood pressure History of a  connection between 2 or more body parts that do not usually connect (fistula) History of a tear in your stomach or intestines Protein in your urine An unusual or allergic reaction to bevacizumab, other medications, foods, dyes, or preservatives Pregnant or trying to get pregnant Breast-feeding How should I use this medication? This medication is injected into a vein. It is given by your care team in a hospital or clinic setting. Talk to your care team the use of this medication in children. Special care may be needed. Overdosage: If you think you have taken too much of this medicine contact a poison control center or emergency room at once. NOTE: This medicine is only for you. Do not share this medicine with others. What if I miss a dose? Keep appointments for follow-up doses. It is important not to miss your dose. Call your care team if you are unable to keep an appointment. What may interact with this medication? Interactions are not expected. This list may not describe all possible interactions. Give your health care provider a list of all the medicines, herbs, non-prescription drugs, or dietary supplements you use. Also tell them if you smoke, drink alcohol, or use illegal drugs. Some items may interact with your medicine. What should I watch for while using this medication? Your condition will be monitored carefully while you are receiving this medication. You may need blood work while taking this medication. This medication may make you feel generally unwell. This is not uncommon as chemotherapy can affect healthy cells as well as cancer  cells. Report any side effects. Continue your course of treatment even though you feel ill unless your care team tells you to stop. This medication may increase your risk to bruise or bleed. Call your care team if you notice any unusual bleeding. Before having surgery, talk to your care team to make sure it is ok. This medication can increase the risk of  poor healing of your surgical site or wound. You will need to stop this medication for 28 days before surgery. After surgery, wait at least 28 days before restarting this medication. Make sure the surgical site or wound is healed enough before restarting this medication. Talk to your care team if questions. Talk to your care team if you may be pregnant. Serious birth defects can occur if you take this medication during pregnancy and for 6 months after the last dose. Contraception is recommended while taking this medication and for 6 months after the last dose. Your care team can help you find the option that works for you. Do not breastfeed while taking this medication and for 6 months after the last dose. This medication can cause infertility. Talk to your care team if you are concerned about your fertility. What side effects may I notice from receiving this medication? Side effects that you should report to your care team as soon as possible: Allergic reactions--skin rash, itching, hives, swelling of the face, lips, tongue, or throat Bleeding--bloody or black, tar-like stools, vomiting blood or brown material that looks like coffee grounds, red or dark brown urine, small red or purple spots on skin, unusual bruising or bleeding Blood clot--pain, swelling, or warmth in the leg, shortness of breath, chest pain Heart attack--pain or tightness in the chest, shoulders, arms, or jaw, nausea, shortness of breath, cold or clammy skin, feeling faint or lightheaded Heart failure--shortness of breath, swelling of the ankles, feet, or hands, sudden weight gain, unusual weakness or fatigue Increase in blood pressure Infection--fever, chills, cough, sore throat, wounds that don't heal, pain or trouble when passing urine, general feeling of discomfort or being unwell Infusion reactions--chest pain, shortness of breath or trouble breathing, feeling faint or lightheaded Kidney injury--decrease in the amount of urine,  swelling of the ankles, hands, or feet Stomach pain that is severe, does not go away, or gets worse Stroke--sudden numbness or weakness of the face, arm, or leg, trouble speaking, confusion, trouble walking, loss of balance or coordination, dizziness, severe headache, change in vision Sudden and severe headache, confusion, change in vision, seizures, which may be signs of posterior reversible encephalopathy syndrome (PRES) Side effects that usually do not require medical attention (report to your care team if they continue or are bothersome): Back pain Change in taste Diarrhea Dry skin Increased tears Nosebleed This list may not describe all possible side effects. Call your doctor for medical advice about side effects. You may report side effects to FDA at 1-800-FDA-1088. Where should I keep my medication? This medication is given in a hospital or clinic. It will not be stored at home. NOTE: This sheet is a summary. It may not cover all possible information. If you have questions about this medicine, talk to your doctor, pharmacist, or health care provider.  2023 Elsevier/Gold Standard (2021-10-17 00:00:00)  Leucovorin Injection What is this medication? LEUCOVORIN (loo koe VOR in) prevents side effects from certain medications, such as methotrexate. It works by increasing folate levels. This helps protect healthy cells in your body. It may also be used to treat anemia caused by  low levels of folate. It can also be used with fluorouracil, a type of chemotherapy, to treat colorectal cancer. It works by increasing the effects of fluorouracil in the body. This medicine may be used for other purposes; ask your health care provider or pharmacist if you have questions. What should I tell my care team before I take this medication? They need to know if you have any of these conditions: Anemia from low levels of vitamin B12 in the blood An unusual or allergic reaction to leucovorin, folic acid, other  medications, foods, dyes, or preservatives Pregnant or trying to get pregnant Breastfeeding How should I use this medication? This medication is injected into a vein or a muscle. It is given by your care team in a hospital or clinic setting. Talk to your care team about the use of this medication in children. Special care may be needed. Overdosage: If you think you have taken too much of this medicine contact a poison control center or emergency room at once. NOTE: This medicine is only for you. Do not share this medicine with others. What if I miss a dose? Keep appointments for follow-up doses. It is important not to miss your dose. Call your care team if you are unable to keep an appointment. What may interact with this medication? Capecitabine Fluorouracil Phenobarbital Phenytoin Primidone Trimethoprim;sulfamethoxazole This list may not describe all possible interactions. Give your health care provider a list of all the medicines, herbs, non-prescription drugs, or dietary supplements you use. Also tell them if you smoke, drink alcohol, or use illegal drugs. Some items may interact with your medicine. What should I watch for while using this medication? Your condition will be monitored carefully while you are receiving this medication. This medication may increase the side effects of 5-fluorouracil. Tell your care team if you have diarrhea or mouth sores that do not get better or that get worse. What side effects may I notice from receiving this medication? Side effects that you should report to your care team as soon as possible: Allergic reactions--skin rash, itching, hives, swelling of the face, lips, tongue, or throat This list may not describe all possible side effects. Call your doctor for medical advice about side effects. You may report side effects to FDA at 1-800-FDA-1088. Where should I keep my medication? This medication is given in a hospital or clinic. It will not be stored  at home. NOTE: This sheet is a summary. It may not cover all possible information. If you have questions about this medicine, talk to your doctor, pharmacist, or health care provider.  2023 Elsevier/Gold Standard (2021-10-24 00:00:00)  Fluorouracil Injection What is this medication? FLUOROURACIL (flure oh YOOR a sil) treats some types of cancer. It works by slowing down the growth of cancer cells. This medicine may be used for other purposes; ask your health care provider or pharmacist if you have questions. COMMON BRAND NAME(S): Adrucil What should I tell my care team before I take this medication? They need to know if you have any of these conditions: Blood disorders Dihydropyrimidine dehydrogenase (DPD) deficiency Infection, such as chickenpox, cold sores, herpes Kidney disease Liver disease Poor nutrition Recent or ongoing radiation therapy An unusual or allergic reaction to fluorouracil, other medications, foods, dyes, or preservatives If you or your partner are pregnant or trying to get pregnant Breast-feeding How should I use this medication? This medication is injected into a vein. It is administered by your care team in a hospital or clinic setting. Talk  to your care team about the use of this medication in children. Special care may be needed. Overdosage: If you think you have taken too much of this medicine contact a poison control center or emergency room at once. NOTE: This medicine is only for you. Do not share this medicine with others. What if I miss a dose? Keep appointments for follow-up doses. It is important not to miss your dose. Call your care team if you are unable to keep an appointment. What may interact with this medication? Do not take this medication with any of the following: Live virus vaccines This medication may also interact with the following: Medications that treat or prevent blood clots, such as warfarin, enoxaparin, dalteparin This list may not  describe all possible interactions. Give your health care provider a list of all the medicines, herbs, non-prescription drugs, or dietary supplements you use. Also tell them if you smoke, drink alcohol, or use illegal drugs. Some items may interact with your medicine. What should I watch for while using this medication? Your condition will be monitored carefully while you are receiving this medication. This medication may make you feel generally unwell. This is not uncommon as chemotherapy can affect healthy cells as well as cancer cells. Report any side effects. Continue your course of treatment even though you feel ill unless your care team tells you to stop. In some cases, you may be given additional medications to help with side effects. Follow all directions for their use. This medication may increase your risk of getting an infection. Call your care team for advice if you get a fever, chills, sore throat, or other symptoms of a cold or flu. Do not treat yourself. Try to avoid being around people who are sick. This medication may increase your risk to bruise or bleed. Call your care team if you notice any unusual bleeding. Be careful brushing or flossing your teeth or using a toothpick because you may get an infection or bleed more easily. If you have any dental work done, tell your dentist you are receiving this medication. Avoid taking medications that contain aspirin, acetaminophen, ibuprofen, naproxen, or ketoprofen unless instructed by your care team. These medications may hide a fever. Do not treat diarrhea with over the counter products. Contact your care team if you have diarrhea that lasts more than 2 days or if it is severe and watery. This medication can make you more sensitive to the sun. Keep out of the sun. If you cannot avoid being in the sun, wear protective clothing and sunscreen. Do not use sun lamps, tanning beds, or tanning booths. Talk to your care team if you or your partner  wish to become pregnant or think you might be pregnant. This medication can cause serious birth defects if taken during pregnancy and for 3 months after the last dose. A reliable form of contraception is recommended while taking this medication and for 3 months after the last dose. Talk to your care team about effective forms of contraception. Do not father a child while taking this medication and for 3 months after the last dose. Use a condom while having sex during this time period. Do not breastfeed while taking this medication. This medication may cause infertility. Talk to your care team if you are concerned about your fertility. What side effects may I notice from receiving this medication? Side effects that you should report to your care team as soon as possible: Allergic reactions--skin rash, itching, hives, swelling of the  face, lips, tongue, or throat Heart attack--pain or tightness in the chest, shoulders, arms, or jaw, nausea, shortness of breath, cold or clammy skin, feeling faint or lightheaded Heart failure--shortness of breath, swelling of the ankles, feet, or hands, sudden weight gain, unusual weakness or fatigue Heart rhythm changes--fast or irregular heartbeat, dizziness, feeling faint or lightheaded, chest pain, trouble breathing High ammonia level--unusual weakness or fatigue, confusion, loss of appetite, nausea, vomiting, seizures Infection--fever, chills, cough, sore throat, wounds that don't heal, pain or trouble when passing urine, general feeling of discomfort or being unwell Low red blood cell level--unusual weakness or fatigue, dizziness, headache, trouble breathing Pain, tingling, or numbness in the hands or feet, muscle weakness, change in vision, confusion or trouble speaking, loss of balance or coordination, trouble walking, seizures Redness, swelling, and blistering of the skin over hands and feet Severe or prolonged diarrhea Unusual bruising or bleeding Side effects  that usually do not require medical attention (report to your care team if they continue or are bothersome): Dry skin Headache Increased tears Nausea Pain, redness, or swelling with sores inside the mouth or throat Sensitivity to light Vomiting This list may not describe all possible side effects. Call your doctor for medical advice about side effects. You may report side effects to FDA at 1-800-FDA-1088. Where should I keep my medication? This medication is given in a hospital or clinic. It will not be stored at home. NOTE: This sheet is a summary. It may not cover all possible information. If you have questions about this medicine, talk to your doctor, pharmacist, or health care provider.  2023 Elsevier/Gold Standard (2021-10-14 00:00:00)     BELOW ARE SYMPTOMS THAT SHOULD BE REPORTED IMMEDIATELY: *FEVER GREATER THAN 100.4 F (38 C) OR HIGHER *CHILLS OR SWEATING *NAUSEA AND VOMITING THAT IS NOT CONTROLLED WITH YOUR NAUSEA MEDICATION *UNUSUAL SHORTNESS OF BREATH *UNUSUAL BRUISING OR BLEEDING *URINARY PROBLEMS (pain or burning when urinating, or frequent urination) *BOWEL PROBLEMS (unusual diarrhea, constipation, pain near the anus) TENDERNESS IN MOUTH AND THROAT WITH OR WITHOUT PRESENCE OF ULCERS (sore throat, sores in mouth, or a toothache) UNUSUAL RASH, SWELLING OR PAIN  UNUSUAL VAGINAL DISCHARGE OR ITCHING   Items with * indicate a potential emergency and should be followed up as soon as possible or go to the Emergency Department if any problems should occur.  Please show the CHEMOTHERAPY ALERT CARD or IMMUNOTHERAPY ALERT CARD at check-in to the Emergency Department and triage nurse.  Should you have questions after your visit or need to cancel or reschedule your appointment, please contact [facility] [phone]  and follow the prompts.  Office hours are 8:00 a.m. to 4:30 p.m. Monday - Friday. Please note that voicemails left after 4:00 p.m. may not be  returned until the following business day.  We are closed weekends and major holidays. You have access to a nurse at all times for urgent questions. Please call the main number to the clinic [phone] and follow the prompts.  For any non-urgent questions, you may also contact your provider using MyChart. We now offer e-Visits for anyone 25 and older to request care online for non-urgent symptoms. For details visit [email or web address].   Also download the MyChart app! Go to the app store, search "MyChart", open the app, select [facility], and log in with your MyChart username and password.

## 2022-11-11 NOTE — Patient Instructions (Addendum)
[facility] at [facility] Discharge Instructions   You were seen and examined today by Dr. Ellin Saba.  He reviewed the results of your lab work which are normal/stable. Your potassium is low at 3.1. We will give you potassium pills in the clinic today. Continue your potassium as prescribed.   He reviewed the results of your CT scan which is stable. There are no new areas of cancer that were noted on this exam, and the cancer has not grown or spread.   We will proceed with your treatment today.   We will increase the dose of your amlodipine (Norvasc - blood pressure medicine) to 10 mg daily. You may take 2 of the 5 mg tablets until you run out of those. We sent a new prescription to your pharmacy for the 10 mg tablets.   Return as scheduled.    Thank you for choosing [facility] at [facility] to provide your oncology and hematology care.  To afford each patient quality time with our provider, please arrive at least 15 minutes before your scheduled appointment time.   If you have a lab appointment with the Cancer Center please come in thru the Main Entrance and check in at the main information desk.  You need to re-schedule your appointment should you arrive 10 or more minutes late.  We strive to give you quality time with our providers, and arriving late affects you and other patients whose appointments are after yours.  Also, if you no show three or more times for appointments you may be dismissed from the clinic at the providers discretion.     Again, thank you for choosing [facility].  Our hope is that these requests will decrease the amount of time that you wait before being seen by our physicians.       _____________________________________________________________  Should you have questions after your visit to [facility], please contact our office at [phone] and follow the prompts.  Our office hours are  8:00 a.m. and 4:30 p.m. Monday - Friday.  Please note that voicemails left after 4:00 p.m. may not be returned until the following business day.  We are closed weekends and major holidays.  You do have access to a nurse 24-7, just call the main number to the clinic [phone] and do not press any options, hold on the line and a nurse will answer the phone.    For prescription refill requests, have your pharmacy contact our office and allow 72 hours.    Due to Covid, you will need to wear a mask upon entering the hospital. If you do not have a mask, a mask will be given to you at the Main Entrance upon arrival. For doctor visits, patients may have 1 support person age 40 or older with them. For treatment visits, patients can not have anyone with them due to social distancing guidelines and our immunocompromised population.

## 2022-11-11 NOTE — Progress Notes (Signed)
Patient has been examined by Dr. Ellin Saba. Vital signs (BP 170/78) and labs have been reviewed by MD - ANC, Creatinine, LFTs, hemoglobin, and platelets are within treatment parameters per M.D. - pt may proceed with treatment. Give clonidine 0.2 mg po x 1 dose per MD.  Primary RN and pharmacy notified.

## 2022-11-11 NOTE — Progress Notes (Signed)
Patient presents today for MVASI/Leucovorin/ and 5FU infusion. Patient is in satisfactory condition with no new complaints voiced.  Vital signs are stable.  Labs reviewed by Dr. Ellin Saba during the office visit and all labs are within treatment parameters. Pt will receive Clonidine 0.2 mg x 1 dose due to blood pressure of 170/78. Pt's will also receive 40 mEq potassium chloride p.o x 1 dose due to potassium level of 3.1 today per Dr.K's standing orders. We will proceed with treatment per MD orders.   Treatment given today per MD orders. Tolerated infusion without adverse affects. Vital signs stable. No complaints at this time. Discharged from clinic ambulatory in stable condition. Alert and oriented x 3. F/U with [facility] as scheduled. 5FU ambulatory pump infusing.

## 2022-11-12 ENCOUNTER — Other Ambulatory Visit: Payer: Self-pay

## 2022-11-12 LAB — CEA: CEA: 7.4 ng/mL — ABNORMAL HIGH (ref 0.0–4.7)

## 2022-11-13 ENCOUNTER — Inpatient Hospital Stay: Payer: No Typology Code available for payment source

## 2022-11-13 VITALS — BP 154/76 | HR 69 | Temp 97.2°F | Resp 18

## 2022-11-13 DIAGNOSIS — Z5112 Encounter for antineoplastic immunotherapy: Secondary | ICD-10-CM | POA: Diagnosis not present

## 2022-11-13 DIAGNOSIS — C189 Malignant neoplasm of colon, unspecified: Secondary | ICD-10-CM

## 2022-11-13 MED ORDER — HEPARIN SOD (PORK) LOCK FLUSH 100 UNIT/ML IV SOLN
500.0000 [IU] | Freq: Once | INTRAVENOUS | Status: AC | PRN
  Administered 2022-11-13: 500 [IU]

## 2022-11-13 MED ORDER — SODIUM CHLORIDE 0.9% FLUSH
10.0000 mL | INTRAVENOUS | Status: DC | PRN
  Administered 2022-11-13: 10 mL

## 2022-11-13 NOTE — Progress Notes (Signed)
Treatment given per orders. Patient tolerated it well without problems. Vitals stable and discharged home from clinic ambulatory. Follow up as scheduled.  

## 2022-11-13 NOTE — Patient Instructions (Signed)
[facility]  Discharge Instructions: Thank you for choosing [facility] to provide your oncology and hematology care.  If you have a lab appointment with the Cancer Center - please note that after April 8th, 2024, all labs will be drawn in the cancer center.  You do not have to check in or register with the main entrance as you have in the past but will complete your check-in in the cancer center.  Wear comfortable clothing and clothing appropriate for easy access to any Portacath or PICC line.   We strive to give you quality time with your provider. You may need to reschedule your appointment if you arrive late (15 or more minutes).  Arriving late affects you and other patients whose appointments are after yours.  Also, if you miss three or more appointments without notifying the office, you may be dismissed from the clinic at the provider's discretion.      For prescription refill requests, have your pharmacy contact our office and allow 72 hours for refills to be completed.    Today you had your ambulatory pump disconnected.    To help prevent nausea and vomiting after your treatment, we encourage you to take your nausea medication as directed.  BELOW ARE SYMPTOMS THAT SHOULD BE REPORTED IMMEDIATELY: *FEVER GREATER THAN 100.4 F (38 C) OR HIGHER *CHILLS OR SWEATING *NAUSEA AND VOMITING THAT IS NOT CONTROLLED WITH YOUR NAUSEA MEDICATION *UNUSUAL SHORTNESS OF BREATH *UNUSUAL BRUISING OR BLEEDING *URINARY PROBLEMS (pain or burning when urinating, or frequent urination) *BOWEL PROBLEMS (unusual diarrhea, constipation, pain near the anus) TENDERNESS IN MOUTH AND THROAT WITH OR WITHOUT PRESENCE OF ULCERS (sore throat, sores in mouth, or a toothache) UNUSUAL RASH, SWELLING OR PAIN  UNUSUAL VAGINAL DISCHARGE OR ITCHING   Items with * indicate a potential emergency and should be followed up as soon as possible or go to the Emergency Department if any problems  should occur.  Please show the CHEMOTHERAPY ALERT CARD or IMMUNOTHERAPY ALERT CARD at check-in to the Emergency Department and triage nurse.  Should you have questions after your visit or need to cancel or reschedule your appointment, please contact [facility] [phone]  and follow the prompts.  Office hours are 8:00 a.m. to 4:30 p.m. Monday - Friday. Please note that voicemails left after 4:00 p.m. may not be returned until the following business day.  We are closed weekends and major holidays. You have access to a nurse at all times for urgent questions. Please call the main number to the clinic [phone] and follow the prompts.  For any non-urgent questions, you may also contact your provider using MyChart. We now offer e-Visits for anyone 18 and older to request care online for non-urgent symptoms. For details visit [email or web address].   Also download the MyChart app! Go to the app store, search "MyChart", open the app, select [facility], and log in with your MyChart username and password.   

## 2022-11-25 ENCOUNTER — Inpatient Hospital Stay: Payer: No Typology Code available for payment source

## 2022-11-25 ENCOUNTER — Inpatient Hospital Stay: Payer: No Typology Code available for payment source | Admitting: Hematology

## 2022-11-25 VITALS — BP 135/76 | HR 83 | Temp 97.7°F | Resp 18

## 2022-11-25 VITALS — BP 152/73 | HR 80 | Temp 97.6°F | Resp 18 | Wt 185.0 lb

## 2022-11-25 DIAGNOSIS — Z95828 Presence of other vascular implants and grafts: Secondary | ICD-10-CM

## 2022-11-25 DIAGNOSIS — C189 Malignant neoplasm of colon, unspecified: Secondary | ICD-10-CM

## 2022-11-25 DIAGNOSIS — Z5112 Encounter for antineoplastic immunotherapy: Secondary | ICD-10-CM | POA: Diagnosis not present

## 2022-11-25 LAB — URINALYSIS, DIPSTICK ONLY
Bilirubin Urine: NEGATIVE
Glucose, UA: NEGATIVE mg/dL
Hgb urine dipstick: NEGATIVE
Ketones, ur: NEGATIVE mg/dL
Leukocytes,Ua: NEGATIVE
Nitrite: NEGATIVE
Protein, ur: 100 mg/dL — AB
Specific Gravity, Urine: 1.016 (ref 1.005–1.030)
pH: 5 (ref 5.0–8.0)

## 2022-11-25 LAB — COMPREHENSIVE METABOLIC PANEL
ALT: 52 U/L — ABNORMAL HIGH (ref 0–44)
AST: 54 U/L — ABNORMAL HIGH (ref 15–41)
Albumin: 3.6 g/dL (ref 3.5–5.0)
Alkaline Phosphatase: 124 U/L (ref 38–126)
Anion gap: 15 (ref 5–15)
BUN: 13 mg/dL (ref 8–23)
CO2: 21 mmol/L — ABNORMAL LOW (ref 22–32)
Calcium: 8.9 mg/dL (ref 8.9–10.3)
Chloride: 97 mmol/L — ABNORMAL LOW (ref 98–111)
Creatinine, Ser: 0.95 mg/dL (ref 0.44–1.00)
GFR, Estimated: >60 mL/min (ref >60)
Glucose, Bld: 146 mg/dL — ABNORMAL HIGH (ref 70–99)
Potassium: 3.1 mmol/L — ABNORMAL LOW (ref 3.5–5.1)
Sodium: 133 mmol/L — ABNORMAL LOW (ref 135–145)
Total Bilirubin: 1.4 mg/dL — ABNORMAL HIGH (ref 0.3–1.2)
Total Protein: 7.5 g/dL (ref 6.5–8.1)

## 2022-11-25 LAB — CBC WITH DIFFERENTIAL/PLATELET
Abs Immature Granulocytes: 0.02 10*3/uL (ref 0.00–0.07)
Basophils Absolute: 0.1 10*3/uL (ref 0.0–0.1)
Basophils Relative: 1 %
Eosinophils Absolute: 0.2 10*3/uL (ref 0.0–0.5)
Eosinophils Relative: 3 %
HCT: 40.1 % (ref 36.0–46.0)
Hemoglobin: 13.6 g/dL (ref 12.0–15.0)
Immature Granulocytes: 0 %
Lymphocytes Relative: 21 %
Lymphs Abs: 1.4 10*3/uL (ref 0.7–4.0)
MCH: 31.9 pg (ref 26.0–34.0)
MCHC: 33.9 g/dL (ref 30.0–36.0)
MCV: 94.1 fL (ref 80.0–100.0)
Monocytes Absolute: 0.7 10*3/uL (ref 0.1–1.0)
Monocytes Relative: 10 %
Neutro Abs: 4.3 10*3/uL (ref 1.7–7.7)
Neutrophils Relative %: 65 %
Platelets: 104 10*3/uL — ABNORMAL LOW (ref 150–400)
RBC: 4.26 MIL/uL (ref 3.87–5.11)
RDW: 16.3 % — ABNORMAL HIGH (ref 11.5–15.5)
WBC: 6.6 10*3/uL (ref 4.0–10.5)
nRBC: 0 % (ref 0.0–0.2)

## 2022-11-25 LAB — MAGNESIUM: Magnesium: 1.8 mg/dL (ref 1.7–2.4)

## 2022-11-25 MED ORDER — PALONOSETRON HCL INJECTION 0.25 MG/5ML
0.2500 mg | Freq: Once | INTRAVENOUS | Status: AC
  Administered 2022-11-25: 0.25 mg via INTRAVENOUS
  Filled 2022-11-25: qty 5

## 2022-11-25 MED ORDER — SODIUM CHLORIDE 0.9 % IV SOLN
3250.0000 mg | INTRAVENOUS | Status: AC
  Administered 2022-11-25: 3250 mg via INTRAVENOUS
  Filled 2022-11-25: qty 65

## 2022-11-25 MED ORDER — POTASSIUM CHLORIDE CRYS ER 20 MEQ PO TBCR
40.0000 meq | EXTENDED_RELEASE_TABLET | Freq: Once | ORAL | Status: AC
  Administered 2022-11-25: 40 meq via ORAL
  Filled 2022-11-25: qty 2

## 2022-11-25 MED ORDER — SODIUM CHLORIDE 0.9 % IV SOLN
10.0000 mg | Freq: Once | INTRAVENOUS | Status: AC
  Administered 2022-11-25: 10 mg via INTRAVENOUS
  Filled 2022-11-25: qty 10

## 2022-11-25 MED ORDER — SODIUM CHLORIDE 0.9 % IV SOLN
5.0000 mg/kg | Freq: Once | INTRAVENOUS | Status: AC
  Administered 2022-11-25: 400 mg via INTRAVENOUS
  Filled 2022-11-25: qty 16

## 2022-11-25 MED ORDER — SODIUM CHLORIDE 0.9 % IV SOLN: Freq: Once | INTRAVENOUS | Status: AC

## 2022-11-25 MED ORDER — FLUOROURACIL CHEMO INJECTION 500 MG/10ML
270.0000 mg/m2 | Freq: Once | INTRAVENOUS | Status: AC
  Administered 2022-11-25: 500 mg via INTRAVENOUS
  Filled 2022-11-25: qty 10

## 2022-11-25 MED ORDER — SODIUM CHLORIDE 0.9 % IV SOLN
320.0000 mg/m2 | Freq: Once | INTRAVENOUS | Status: AC
  Administered 2022-11-25: 628 mg via INTRAVENOUS
  Filled 2022-11-25: qty 31.4

## 2022-11-25 MED ORDER — SODIUM CHLORIDE 0.9% FLUSH
10.0000 mL | Freq: Once | INTRAVENOUS | Status: AC
  Administered 2022-11-25: 10 mL via INTRAVENOUS

## 2022-11-25 NOTE — Progress Notes (Signed)
Patient presents today for Vegzelma, Leucovorin, 5FU pump. Vital signs within parameters for treatment. Potassium 3.1. Standing orders followed: K-Dur 40 mEq po x 1 dose. Urine protein 100. Message sent to Dr. Ellin Saba / [employer] and UA reported.   Message received from Dr. Ellin Saba to proceed with [facility].

## 2022-11-27 ENCOUNTER — Inpatient Hospital Stay: Payer: No Typology Code available for payment source

## 2022-11-27 VITALS — BP 157/91 | HR 83 | Resp 18

## 2022-11-27 DIAGNOSIS — C189 Malignant neoplasm of colon, unspecified: Secondary | ICD-10-CM

## 2022-11-27 DIAGNOSIS — Z5112 Encounter for antineoplastic immunotherapy: Secondary | ICD-10-CM | POA: Diagnosis not present

## 2022-11-27 MED ORDER — SODIUM CHLORIDE 0.9% FLUSH
10.0000 mL | INTRAVENOUS | Status: DC | PRN
  Administered 2022-11-27: 10 mL

## 2022-11-27 MED ORDER — HEPARIN SOD (PORK) LOCK FLUSH 100 UNIT/ML IV SOLN
500.0000 [IU] | Freq: Once | INTRAVENOUS | Status: AC | PRN
  Administered 2022-11-27: 500 [IU]

## 2022-11-27 NOTE — Progress Notes (Signed)
Patient presents today for pump d/c. Vital signs are stable. Port a cath site clean, dry, and intact. Port flushed with 10 mls of Normal Saline and 500 Units of Heparin. Needle removed intact. Band aid applied. Patient has no complaints at this time. Discharged from clinic ambulatory and in stable condition. Patient alert and oriented.  

## 2022-11-27 NOTE — Patient Instructions (Signed)
[facility]  Discharge Instructions: Thank you for choosing [facility] to provide your oncology and hematology care.  If you have a lab appointment with the Cancer Center - please note that after April 8th, 2024, all labs will be drawn in the cancer center.  You do not have to check in or register with the main entrance as you have in the past but will complete your check-in in the cancer center.  Wear comfortable clothing and clothing appropriate for easy access to any Portacath or PICC line.   We strive to give you quality time with your provider. You may need to reschedule your appointment if you arrive late (15 or more minutes).  Arriving late affects you and other patients whose appointments are after yours.  Also, if you miss three or more appointments without notifying the office, you may be dismissed from the clinic at the provider's discretion.      For prescription refill requests, have your pharmacy contact our office and allow 72 hours for refills to be completed.    Today you received the following chemotherapy and/or immunotherapy agents pump d/c.      Fluorouracil Injection What is this medication? FLUOROURACIL (flure oh YOOR a sil) treats some types of cancer. It works by slowing down the growth of cancer cells. This medicine may be used for other purposes; ask your health care provider or pharmacist if you have questions. COMMON BRAND NAME(S): Adrucil What should I tell my care team before I take this medication? They need to know if you have any of these conditions: Blood disorders Dihydropyrimidine dehydrogenase (DPD) deficiency Infection, such as chickenpox, cold sores, herpes Kidney disease Liver disease Poor nutrition Recent or ongoing radiation therapy An unusual or allergic reaction to fluorouracil, other medications, foods, dyes, or preservatives If you or your partner are pregnant or trying to get pregnant Breast-feeding How  should I use this medication? This medication is injected into a vein. It is administered by your care team in a hospital or clinic setting. Talk to your care team about the use of this medication in children. Special care may be needed. Overdosage: If you think you have taken too much of this medicine contact a poison control center or emergency room at once. NOTE: This medicine is only for you. Do not share this medicine with others. What if I miss a dose? Keep appointments for follow-up doses. It is important not to miss your dose. Call your care team if you are unable to keep an appointment. What may interact with this medication? Do not take this medication with any of the following: Live virus vaccines This medication may also interact with the following: Medications that treat or prevent blood clots, such as warfarin, enoxaparin, dalteparin This list may not describe all possible interactions. Give your health care provider a list of all the medicines, herbs, non-prescription drugs, or dietary supplements you use. Also tell them if you smoke, drink alcohol, or use illegal drugs. Some items may interact with your medicine. What should I watch for while using this medication? Your condition will be monitored carefully while you are receiving this medication. This medication may make you feel generally unwell. This is not uncommon as chemotherapy can affect healthy cells as well as cancer cells. Report any side effects. Continue your course of treatment even though you feel ill unless your care team tells you to stop. In some cases, you may be given additional medications to help  with side effects. Follow all directions for their use. This medication may increase your risk of getting an infection. Call your care team for advice if you get a fever, chills, sore throat, or other symptoms of a cold or flu. Do not treat yourself. Try to avoid being around people who are sick. This medication may  increase your risk to bruise or bleed. Call your care team if you notice any unusual bleeding. Be careful brushing or flossing your teeth or using a toothpick because you may get an infection or bleed more easily. If you have any dental work done, tell your dentist you are receiving this medication. Avoid taking medications that contain aspirin, acetaminophen, ibuprofen, naproxen, or ketoprofen unless instructed by your care team. These medications may hide a fever. Do not treat diarrhea with over the counter products. Contact your care team if you have diarrhea that lasts more than 2 days or if it is severe and watery. This medication can make you more sensitive to the sun. Keep out of the sun. If you cannot avoid being in the sun, wear protective clothing and sunscreen. Do not use sun lamps, tanning beds, or tanning booths. Talk to your care team if you or your partner wish to become pregnant or think you might be pregnant. This medication can cause serious birth defects if taken during pregnancy and for 3 months after the last dose. A reliable form of contraception is recommended while taking this medication and for 3 months after the last dose. Talk to your care team about effective forms of contraception. Do not father a child while taking this medication and for 3 months after the last dose. Use a condom while having sex during this time period. Do not breastfeed while taking this medication. This medication may cause infertility. Talk to your care team if you are concerned about your fertility. What side effects may I notice from receiving this medication? Side effects that you should report to your care team as soon as possible: Allergic reactions--skin rash, itching, hives, swelling of the face, lips, tongue, or throat Heart attack--pain or tightness in the chest, shoulders, arms, or jaw, nausea, shortness of breath, cold or clammy skin, feeling faint or lightheaded Heart failure--shortness of  breath, swelling of the ankles, feet, or hands, sudden weight gain, unusual weakness or fatigue Heart rhythm changes--fast or irregular heartbeat, dizziness, feeling faint or lightheaded, chest pain, trouble breathing High ammonia level--unusual weakness or fatigue, confusion, loss of appetite, nausea, vomiting, seizures Infection--fever, chills, cough, sore throat, wounds that don't heal, pain or trouble when passing urine, general feeling of discomfort or being unwell Low red blood cell level--unusual weakness or fatigue, dizziness, headache, trouble breathing Pain, tingling, or numbness in the hands or feet, muscle weakness, change in vision, confusion or trouble speaking, loss of balance or coordination, trouble walking, seizures Redness, swelling, and blistering of the skin over hands and feet Severe or prolonged diarrhea Unusual bruising or bleeding Side effects that usually do not require medical attention (report to your care team if they continue or are bothersome): Dry skin Headache Increased tears Nausea Pain, redness, or swelling with sores inside the mouth or throat Sensitivity to light Vomiting This list may not describe all possible side effects. Call your doctor for medical advice about side effects. You may report side effects to FDA at 1-800-FDA-1088. Where should I keep my medication? This medication is given in a hospital or clinic. It will not be stored at home. NOTE: This sheet  is a summary. It may not cover all possible information. If you have questions about this medicine, talk to your doctor, pharmacist, or health care provider.  2024 Elsevier/Gold Standard (2021-10-21 00:00:00)  To help prevent nausea and vomiting after your treatment, we encourage you to take your nausea medication as directed.  BELOW ARE SYMPTOMS THAT SHOULD BE REPORTED IMMEDIATELY: *FEVER GREATER THAN 100.4 F (38 C) OR HIGHER *CHILLS OR SWEATING *NAUSEA AND VOMITING THAT IS NOT CONTROLLED  WITH YOUR NAUSEA MEDICATION *UNUSUAL SHORTNESS OF BREATH *UNUSUAL BRUISING OR BLEEDING *URINARY PROBLEMS (pain or burning when urinating, or frequent urination) *BOWEL PROBLEMS (unusual diarrhea, constipation, pain near the anus) TENDERNESS IN MOUTH AND THROAT WITH OR WITHOUT PRESENCE OF ULCERS (sore throat, sores in mouth, or a toothache) UNUSUAL RASH, SWELLING OR PAIN  UNUSUAL VAGINAL DISCHARGE OR ITCHING   Items with * indicate a potential emergency and should be followed up as soon as possible or go to the Emergency Department if any problems should occur.  Please show the CHEMOTHERAPY ALERT CARD or IMMUNOTHERAPY ALERT CARD at check-in to the Emergency Department and triage nurse.  Should you have questions after your visit or need to cancel or reschedule your appointment, please contact [facility] [phone]  and follow the prompts.  Office hours are 8:00 a.m. to 4:30 p.m. Monday - Friday. Please note that voicemails left after 4:00 p.m. may not be returned until the following business day.  We are closed weekends and major holidays. You have access to a nurse at all times for urgent questions. Please call the main number to the clinic [phone] and follow the prompts.  For any non-urgent questions, you may also contact your provider using MyChart. We now offer e-Visits for anyone 58 and older to request care online for non-urgent symptoms. For details visit [email or web address].   Also download the MyChart app! Go to the app store, search "MyChart", open the app, select [facility], and log in with your MyChart username and password.

## 2022-12-09 ENCOUNTER — Inpatient Hospital Stay: Payer: No Typology Code available for payment source | Attending: Hematology

## 2022-12-09 ENCOUNTER — Inpatient Hospital Stay: Payer: No Typology Code available for payment source

## 2022-12-09 ENCOUNTER — Inpatient Hospital Stay: Payer: No Typology Code available for payment source | Admitting: Hematology

## 2022-12-09 VITALS — BP 141/73 | HR 83 | Temp 98.3°F | Resp 18

## 2022-12-09 VITALS — BP 153/84 | HR 84 | Temp 98.8°F | Resp 20 | Wt 186.6 lb

## 2022-12-09 DIAGNOSIS — C787 Secondary malignant neoplasm of liver and intrahepatic bile duct: Secondary | ICD-10-CM | POA: Diagnosis not present

## 2022-12-09 DIAGNOSIS — C189 Malignant neoplasm of colon, unspecified: Secondary | ICD-10-CM | POA: Insufficient documentation

## 2022-12-09 DIAGNOSIS — Z5112 Encounter for antineoplastic immunotherapy: Secondary | ICD-10-CM | POA: Insufficient documentation

## 2022-12-09 DIAGNOSIS — Z95828 Presence of other vascular implants and grafts: Secondary | ICD-10-CM

## 2022-12-09 LAB — CBC WITH DIFFERENTIAL/PLATELET
Abs Immature Granulocytes: 0.01 10*3/uL (ref 0.00–0.07)
Basophils Absolute: 0 10*3/uL (ref 0.0–0.1)
Basophils Relative: 1 %
Eosinophils Absolute: 0.1 10*3/uL (ref 0.0–0.5)
Eosinophils Relative: 1 %
HCT: 42.3 % (ref 36.0–46.0)
Hemoglobin: 14.2 g/dL (ref 12.0–15.0)
Immature Granulocytes: 0 %
Lymphocytes Relative: 20 %
Lymphs Abs: 1.1 10*3/uL (ref 0.7–4.0)
MCH: 31.5 pg (ref 26.0–34.0)
MCHC: 33.6 g/dL (ref 30.0–36.0)
MCV: 93.8 fL (ref 80.0–100.0)
Monocytes Absolute: 0.5 10*3/uL (ref 0.1–1.0)
Monocytes Relative: 9 %
Neutro Abs: 3.8 10*3/uL (ref 1.7–7.7)
Neutrophils Relative %: 69 %
Platelets: 104 10*3/uL — ABNORMAL LOW (ref 150–400)
RBC: 4.51 MIL/uL (ref 3.87–5.11)
RDW: 16.2 % — ABNORMAL HIGH (ref 11.5–15.5)
WBC: 5.6 10*3/uL (ref 4.0–10.5)
nRBC: 0 % (ref 0.0–0.2)

## 2022-12-09 LAB — COMPREHENSIVE METABOLIC PANEL
ALT: 45 U/L — ABNORMAL HIGH (ref 0–44)
AST: 52 U/L — ABNORMAL HIGH (ref 15–41)
Albumin: 3.4 g/dL — ABNORMAL LOW (ref 3.5–5.0)
Alkaline Phosphatase: 135 U/L — ABNORMAL HIGH (ref 38–126)
Anion gap: 10 (ref 5–15)
BUN: 14 mg/dL (ref 8–23)
CO2: 24 mmol/L (ref 22–32)
Calcium: 8.8 mg/dL — ABNORMAL LOW (ref 8.9–10.3)
Chloride: 100 mmol/L (ref 98–111)
Creatinine, Ser: 0.86 mg/dL (ref 0.44–1.00)
GFR, Estimated: >60 mL/min (ref >60)
Glucose, Bld: 174 mg/dL — ABNORMAL HIGH (ref 70–99)
Potassium: 3 mmol/L — ABNORMAL LOW (ref 3.5–5.1)
Sodium: 134 mmol/L — ABNORMAL LOW (ref 135–145)
Total Bilirubin: 1.3 mg/dL — ABNORMAL HIGH (ref 0.3–1.2)
Total Protein: 7.4 g/dL (ref 6.5–8.1)

## 2022-12-09 LAB — MAGNESIUM: Magnesium: 1.8 mg/dL (ref 1.7–2.4)

## 2022-12-09 MED ORDER — ALTEPLASE 2 MG IJ SOLR
2.0000 mg | Freq: Once | INTRAMUSCULAR | Status: AC
  Administered 2022-12-09: 2 mg
  Filled 2022-12-09: qty 2

## 2022-12-09 MED ORDER — SODIUM CHLORIDE 0.9 % IV SOLN
10.0000 mg | Freq: Once | INTRAVENOUS | Status: AC
  Administered 2022-12-09: 10 mg via INTRAVENOUS
  Filled 2022-12-09: qty 10

## 2022-12-09 MED ORDER — PALONOSETRON HCL INJECTION 0.25 MG/5ML
0.2500 mg | Freq: Once | INTRAVENOUS | Status: AC
  Administered 2022-12-09: 0.25 mg via INTRAVENOUS
  Filled 2022-12-09: qty 5

## 2022-12-09 MED ORDER — SODIUM CHLORIDE 0.9 % IV SOLN
3250.0000 mg | INTRAVENOUS | Status: DC
  Administered 2022-12-09: 3250 mg via INTRAVENOUS
  Filled 2022-12-09: qty 65

## 2022-12-09 MED ORDER — SODIUM CHLORIDE 0.9 % IV SOLN
5.0000 mg/kg | Freq: Once | INTRAVENOUS | Status: AC
  Administered 2022-12-09: 400 mg via INTRAVENOUS
  Filled 2022-12-09: qty 16

## 2022-12-09 MED ORDER — FLUOROURACIL CHEMO INJECTION 500 MG/10ML
270.0000 mg/m2 | Freq: Once | INTRAVENOUS | Status: AC
  Administered 2022-12-09: 500 mg via INTRAVENOUS
  Filled 2022-12-09: qty 10

## 2022-12-09 MED ORDER — SODIUM CHLORIDE 0.9% FLUSH
10.0000 mL | Freq: Once | INTRAVENOUS | Status: AC
  Administered 2022-12-09: 10 mL via INTRAVENOUS

## 2022-12-09 MED ORDER — POTASSIUM CHLORIDE CRYS ER 20 MEQ PO TBCR
40.0000 meq | EXTENDED_RELEASE_TABLET | Freq: Once | ORAL | Status: AC
  Administered 2022-12-09: 40 meq via ORAL
  Filled 2022-12-09: qty 2

## 2022-12-09 MED ORDER — SODIUM CHLORIDE 0.9 % IV SOLN
320.0000 mg/m2 | Freq: Once | INTRAVENOUS | Status: AC
  Administered 2022-12-09: 628 mg via INTRAVENOUS
  Filled 2022-12-09: qty 31.4

## 2022-12-09 MED ORDER — SODIUM CHLORIDE 0.9 % IV SOLN: Freq: Once | INTRAVENOUS | Status: AC

## 2022-12-09 NOTE — Patient Instructions (Signed)
[facility]  Discharge Instructions: Thank you for choosing [facility] to provide your oncology and hematology care.  If you have a lab appointment with the Cancer Center - please note that after April 8th, 2024, all labs will be drawn in the cancer center.  You do not have to check in or register with the main entrance as you have in the past but will complete your check-in in the cancer center.  Wear comfortable clothing and clothing appropriate for easy access to any Portacath or PICC line.   We strive to give you quality time with your provider. You may need to reschedule your appointment if you arrive late (15 or more minutes).  Arriving late affects you and other patients whose appointments are after yours.  Also, if you miss three or more appointments without notifying the office, you may be dismissed from the clinic at the provider's discretion.      For prescription refill requests, have your pharmacy contact our office and allow 72 hours for refills to be completed.    Today you received the following chemotherapy and/or immunotherapy agents MVASI, Leucovorin, and 5FU   To help prevent nausea and vomiting after your treatment, we encourage you to take your nausea medication as directed.  Bevacizumab Injection What is this medication? BEVACIZUMAB (be va SIZ yoo mab) treats some types of cancer. It works by blocking a protein that causes cancer cells to grow and multiply. This helps to slow or stop the spread of cancer cells. It is a monoclonal antibody. This medicine may be used for other purposes; ask your health care provider or pharmacist if you have questions. COMMON BRAND NAME(S): Alymsys, Avastin, MVASI, Omer Jack What should I tell my care team before I take this medication? They need to know if you have any of these conditions: Blood clots Coughing up blood Having or recent surgery Heart failure High blood pressure History of a connection  between 2 or more body parts that do not usually connect (fistula) History of a tear in your stomach or intestines Protein in your urine An unusual or allergic reaction to bevacizumab, other medications, foods, dyes, or preservatives Pregnant or trying to get pregnant Breast-feeding How should I use this medication? This medication is injected into a vein. It is given by your care team in a hospital or clinic setting. Talk to your care team the use of this medication in children. Special care may be needed. Overdosage: If you think you have taken too much of this medicine contact a poison control center or emergency room at once. NOTE: This medicine is only for you. Do not share this medicine with others. What if I miss a dose? Keep appointments for follow-up doses. It is important not to miss your dose. Call your care team if you are unable to keep an appointment. What may interact with this medication? Interactions are not expected. This list may not describe all possible interactions. Give your health care provider a list of all the medicines, herbs, non-prescription drugs, or dietary supplements you use. Also tell them if you smoke, drink alcohol, or use illegal drugs. Some items may interact with your medicine. What should I watch for while using this medication? Your condition will be monitored carefully while you are receiving this medication. You may need blood work while taking this medication. This medication may make you feel generally unwell. This is not uncommon as chemotherapy can affect healthy cells as well as cancer  cells. Report any side effects. Continue your course of treatment even though you feel ill unless your care team tells you to stop. This medication may increase your risk to bruise or bleed. Call your care team if you notice any unusual bleeding. Before having surgery, talk to your care team to make sure it is ok. This medication can increase the risk of poor healing  of your surgical site or wound. You will need to stop this medication for 28 days before surgery. After surgery, wait at least 28 days before restarting this medication. Make sure the surgical site or wound is healed enough before restarting this medication. Talk to your care team if questions. Talk to your care team if you may be pregnant. Serious birth defects can occur if you take this medication during pregnancy and for 6 months after the last dose. Contraception is recommended while taking this medication and for 6 months after the last dose. Your care team can help you find the option that works for you. Do not breastfeed while taking this medication and for 6 months after the last dose. This medication can cause infertility. Talk to your care team if you are concerned about your fertility. What side effects may I notice from receiving this medication? Side effects that you should report to your care team as soon as possible: Allergic reactions--skin rash, itching, hives, swelling of the face, lips, tongue, or throat Bleeding--bloody or black, tar-like stools, vomiting blood or brown material that looks like coffee grounds, red or dark brown urine, small red or purple spots on skin, unusual bruising or bleeding Blood clot--pain, swelling, or warmth in the leg, shortness of breath, chest pain Heart attack--pain or tightness in the chest, shoulders, arms, or jaw, nausea, shortness of breath, cold or clammy skin, feeling faint or lightheaded Heart failure--shortness of breath, swelling of the ankles, feet, or hands, sudden weight gain, unusual weakness or fatigue Increase in blood pressure Infection--fever, chills, cough, sore throat, wounds that don't heal, pain or trouble when passing urine, general feeling of discomfort or being unwell Infusion reactions--chest pain, shortness of breath or trouble breathing, feeling faint or lightheaded Kidney injury--decrease in the amount of urine, swelling of  the ankles, hands, or feet Stomach pain that is severe, does not go away, or gets worse Stroke--sudden numbness or weakness of the face, arm, or leg, trouble speaking, confusion, trouble walking, loss of balance or coordination, dizziness, severe headache, change in vision Sudden and severe headache, confusion, change in vision, seizures, which may be signs of posterior reversible encephalopathy syndrome (PRES) Side effects that usually do not require medical attention (report to your care team if they continue or are bothersome): Back pain Change in taste Diarrhea Dry skin Increased tears Nosebleed This list may not describe all possible side effects. Call your doctor for medical advice about side effects. You may report side effects to FDA at 1-800-FDA-1088. Where should I keep my medication? This medication is given in a hospital or clinic. It will not be stored at home. NOTE: This sheet is a summary. It may not cover all possible information. If you have questions about this medicine, talk to your doctor, pharmacist, or health care provider.  2024 Elsevier/Gold Standard (2021-10-31 00:00:00)  Leucovorin Injection What is this medication? LEUCOVORIN (loo koe VOR in) prevents side effects from certain medications, such as methotrexate. It works by increasing folate levels. This helps protect healthy cells in your body. It may also be used to treat anemia caused by  low levels of folate. It can also be used with fluorouracil, a type of chemotherapy, to treat colorectal cancer. It works by increasing the effects of fluorouracil in the body. This medicine may be used for other purposes; ask your health care provider or pharmacist if you have questions. What should I tell my care team before I take this medication? They need to know if you have any of these conditions: Anemia from low levels of vitamin B12 in the blood An unusual or allergic reaction to leucovorin, folic acid, other  medications, foods, dyes, or preservatives Pregnant or trying to get pregnant Breastfeeding How should I use this medication? This medication is injected into a vein or a muscle. It is given by your care team in a hospital or clinic setting. Talk to your care team about the use of this medication in children. Special care may be needed. Overdosage: If you think you have taken too much of this medicine contact a poison control center or emergency room at once. NOTE: This medicine is only for you. Do not share this medicine with others. What if I miss a dose? Keep appointments for follow-up doses. It is important not to miss your dose. Call your care team if you are unable to keep an appointment. What may interact with this medication? Capecitabine Fluorouracil Phenobarbital Phenytoin Primidone Trimethoprim;sulfamethoxazole This list may not describe all possible interactions. Give your health care provider a list of all the medicines, herbs, non-prescription drugs, or dietary supplements you use. Also tell them if you smoke, drink alcohol, or use illegal drugs. Some items may interact with your medicine. What should I watch for while using this medication? Your condition will be monitored carefully while you are receiving this medication. This medication may increase the side effects of 5-fluorouracil. Tell your care team if you have diarrhea or mouth sores that do not get better or that get worse. What side effects may I notice from receiving this medication? Side effects that you should report to your care team as soon as possible: Allergic reactions--skin rash, itching, hives, swelling of the face, lips, tongue, or throat This list may not describe all possible side effects. Call your doctor for medical advice about side effects. You may report side effects to FDA at 1-800-FDA-1088. Where should I keep my medication? This medication is given in a hospital or clinic. It will not be stored  at home. NOTE: This sheet is a summary. It may not cover all possible information. If you have questions about this medicine, talk to your doctor, pharmacist, or health care provider.  2024 Elsevier/Gold Standard (2021-11-18 00:00:00)  Fluorouracil Injection What is this medication? FLUOROURACIL (flure oh YOOR a sil) treats some types of cancer. It works by slowing down the growth of cancer cells. This medicine may be used for other purposes; ask your health care provider or pharmacist if you have questions. COMMON BRAND NAME(S): Adrucil What should I tell my care team before I take this medication? They need to know if you have any of these conditions: Blood disorders Dihydropyrimidine dehydrogenase (DPD) deficiency Infection, such as chickenpox, cold sores, herpes Kidney disease Liver disease Poor nutrition Recent or ongoing radiation therapy An unusual or allergic reaction to fluorouracil, other medications, foods, dyes, or preservatives If you or your partner are pregnant or trying to get pregnant Breast-feeding How should I use this medication? This medication is injected into a vein. It is administered by your care team in a hospital or clinic setting. Talk  to your care team about the use of this medication in children. Special care may be needed. Overdosage: If you think you have taken too much of this medicine contact a poison control center or emergency room at once. NOTE: This medicine is only for you. Do not share this medicine with others. What if I miss a dose? Keep appointments for follow-up doses. It is important not to miss your dose. Call your care team if you are unable to keep an appointment. What may interact with this medication? Do not take this medication with any of the following: Live virus vaccines This medication may also interact with the following: Medications that treat or prevent blood clots, such as warfarin, enoxaparin, dalteparin This list may not  describe all possible interactions. Give your health care provider a list of all the medicines, herbs, non-prescription drugs, or dietary supplements you use. Also tell them if you smoke, drink alcohol, or use illegal drugs. Some items may interact with your medicine. What should I watch for while using this medication? Your condition will be monitored carefully while you are receiving this medication. This medication may make you feel generally unwell. This is not uncommon as chemotherapy can affect healthy cells as well as cancer cells. Report any side effects. Continue your course of treatment even though you feel ill unless your care team tells you to stop. In some cases, you may be given additional medications to help with side effects. Follow all directions for their use. This medication may increase your risk of getting an infection. Call your care team for advice if you get a fever, chills, sore throat, or other symptoms of a cold or flu. Do not treat yourself. Try to avoid being around people who are sick. This medication may increase your risk to bruise or bleed. Call your care team if you notice any unusual bleeding. Be careful brushing or flossing your teeth or using a toothpick because you may get an infection or bleed more easily. If you have any dental work done, tell your dentist you are receiving this medication. Avoid taking medications that contain aspirin, acetaminophen, ibuprofen, naproxen, or ketoprofen unless instructed by your care team. These medications may hide a fever. Do not treat diarrhea with over the counter products. Contact your care team if you have diarrhea that lasts more than 2 days or if it is severe and watery. This medication can make you more sensitive to the sun. Keep out of the sun. If you cannot avoid being in the sun, wear protective clothing and sunscreen. Do not use sun lamps, tanning beds, or tanning booths. Talk to your care team if you or your partner  wish to become pregnant or think you might be pregnant. This medication can cause serious birth defects if taken during pregnancy and for 3 months after the last dose. A reliable form of contraception is recommended while taking this medication and for 3 months after the last dose. Talk to your care team about effective forms of contraception. Do not father a child while taking this medication and for 3 months after the last dose. Use a condom while having sex during this time period. Do not breastfeed while taking this medication. This medication may cause infertility. Talk to your care team if you are concerned about your fertility. What side effects may I notice from receiving this medication? Side effects that you should report to your care team as soon as possible: Allergic reactions--skin rash, itching, hives, swelling of the  face, lips, tongue, or throat Heart attack--pain or tightness in the chest, shoulders, arms, or jaw, nausea, shortness of breath, cold or clammy skin, feeling faint or lightheaded Heart failure--shortness of breath, swelling of the ankles, feet, or hands, sudden weight gain, unusual weakness or fatigue Heart rhythm changes--fast or irregular heartbeat, dizziness, feeling faint or lightheaded, chest pain, trouble breathing High ammonia level--unusual weakness or fatigue, confusion, loss of appetite, nausea, vomiting, seizures Infection--fever, chills, cough, sore throat, wounds that don't heal, pain or trouble when passing urine, general feeling of discomfort or being unwell Low red blood cell level--unusual weakness or fatigue, dizziness, headache, trouble breathing Pain, tingling, or numbness in the hands or feet, muscle weakness, change in vision, confusion or trouble speaking, loss of balance or coordination, trouble walking, seizures Redness, swelling, and blistering of the skin over hands and feet Severe or prolonged diarrhea Unusual bruising or bleeding Side effects  that usually do not require medical attention (report to your care team if they continue or are bothersome): Dry skin Headache Increased tears Nausea Pain, redness, or swelling with sores inside the mouth or throat Sensitivity to light Vomiting This list may not describe all possible side effects. Call your doctor for medical advice about side effects. You may report side effects to FDA at 1-800-FDA-1088. Where should I keep my medication? This medication is given in a hospital or clinic. It will not be stored at home. NOTE: This sheet is a summary. It may not cover all possible information. If you have questions about this medicine, talk to your doctor, pharmacist, or health care provider.  2024 Elsevier/Gold Standard (2021-10-21 00:00:00)     BELOW ARE SYMPTOMS THAT SHOULD BE REPORTED IMMEDIATELY: *FEVER GREATER THAN 100.4 F (38 C) OR HIGHER *CHILLS OR SWEATING *NAUSEA AND VOMITING THAT IS NOT CONTROLLED WITH YOUR NAUSEA MEDICATION *UNUSUAL SHORTNESS OF BREATH *UNUSUAL BRUISING OR BLEEDING *URINARY PROBLEMS (pain or burning when urinating, or frequent urination) *BOWEL PROBLEMS (unusual diarrhea, constipation, pain near the anus) TENDERNESS IN MOUTH AND THROAT WITH OR WITHOUT PRESENCE OF ULCERS (sore throat, sores in mouth, or a toothache) UNUSUAL RASH, SWELLING OR PAIN  UNUSUAL VAGINAL DISCHARGE OR ITCHING   Items with * indicate a potential emergency and should be followed up as soon as possible or go to the Emergency Department if any problems should occur.  Please show the CHEMOTHERAPY ALERT CARD or IMMUNOTHERAPY ALERT CARD at check-in to the Emergency Department and triage nurse.  Should you have questions after your visit or need to cancel or reschedule your appointment, please contact [facility] [phone]  and follow the prompts.  Office hours are 8:00 a.m. to 4:30 p.m. Monday - Friday. Please note that voicemails left after 4:00 p.m. may not be  returned until the following business day.  We are closed weekends and major holidays. You have access to a nurse at all times for urgent questions. Please call the main number to the clinic [phone] and follow the prompts.  For any non-urgent questions, you may also contact your provider using MyChart. We now offer e-Visits for anyone 60 and older to request care online for non-urgent symptoms. For details visit [email or web address].   Also download the MyChart app! Go to the app store, search "MyChart", open the app, select [place], and log in with your MyChart username and password.

## 2022-12-09 NOTE — Progress Notes (Signed)
Patient presents today for MVASI/Leucovorin/5FU infusion.  Patient is in satisfactory condition with no new complaints voiced.  Vital signs are stable.  Labs reviewed and all labs are within treatment parameters. Pt's potassium noted to be 3.0 today, pt will receive 40 mEq p.o x 1 dose per Dr.K's standing orders.  We will proceed with treatment per MD orders.    Treatment given today per MD orders. Tolerated infusion without adverse affects. Vital signs stable. No complaints at this time. Discharged from clinic ambulatory in stable condition. Alert and oriented x 3. F/U with [facility] as scheduled. 5FU ambulatory pump infusing.

## 2022-12-09 NOTE — Progress Notes (Signed)
The following biosimilar Mvasi (bevacizumab-awwb) has been selected for use in this patient.   Pryor Ochoa, PharmD

## 2022-12-09 NOTE — Progress Notes (Signed)
Patient presents today for port flush lab draw. No blood return noted, alteplase placed at 1038, primary RN made aware.

## 2022-12-11 ENCOUNTER — Inpatient Hospital Stay: Payer: No Typology Code available for payment source

## 2022-12-11 VITALS — BP 143/79 | HR 83 | Temp 98.2°F | Resp 20

## 2022-12-11 DIAGNOSIS — Z5112 Encounter for antineoplastic immunotherapy: Secondary | ICD-10-CM | POA: Diagnosis not present

## 2022-12-11 DIAGNOSIS — C189 Malignant neoplasm of colon, unspecified: Secondary | ICD-10-CM

## 2022-12-11 MED ORDER — HEPARIN SOD (PORK) LOCK FLUSH 100 UNIT/ML IV SOLN
500.0000 [IU] | Freq: Once | INTRAVENOUS | Status: AC | PRN
  Administered 2022-12-11: 500 [IU]

## 2022-12-11 MED ORDER — SODIUM CHLORIDE 0.9% FLUSH
10.0000 mL | INTRAVENOUS | Status: DC | PRN
  Administered 2022-12-11: 10 mL

## 2022-12-11 NOTE — Progress Notes (Signed)
Patients port flushed without difficulty.  Good blood return noted with no bruising or swelling noted at site.  Band aid applied. Home infusion 5FU pump disconnected with no issues.  VSS with discharge and left in satisfactory condition with no s/s of distress noted.   

## 2022-12-11 NOTE — Patient Instructions (Signed)
[facility] [place]  Discharge Instructions: Thank you for choosing [facility] to provide your oncology and hematology care.  If you have a lab appointment with the Cancer Center - please note that after April 8th, 2024, all labs will be drawn in the cancer center.  You do not have to check in or register with the main entrance as you have in the past but will complete your check-in in the cancer center.  Wear comfortable clothing and clothing appropriate for easy access to any Portacath or PICC line.   We strive to give you quality time with your provider. You may need to reschedule your appointment if you arrive late (15 or more minutes).  Arriving late affects you and other patients whose appointments are after yours.  Also, if you miss three or more appointments without notifying the office, you may be dismissed from the clinic at the provider's discretion.      For prescription refill requests, have your pharmacy contact our office and allow 72 hours for refills to be completed.  To help prevent nausea and vomiting after your treatment, we encourage you to take your nausea medication as directed.  BELOW ARE SYMPTOMS THAT SHOULD BE REPORTED IMMEDIATELY: *FEVER GREATER THAN 100.4 F (38 C) OR HIGHER *CHILLS OR SWEATING *NAUSEA AND VOMITING THAT IS NOT CONTROLLED WITH YOUR NAUSEA MEDICATION *UNUSUAL SHORTNESS OF BREATH *UNUSUAL BRUISING OR BLEEDING *URINARY PROBLEMS (pain or burning when urinating, or frequent urination) *BOWEL PROBLEMS (unusual diarrhea, constipation, pain near the anus) TENDERNESS IN MOUTH AND THROAT WITH OR WITHOUT PRESENCE OF ULCERS (sore throat, sores in mouth, or a toothache) UNUSUAL RASH, SWELLING OR PAIN  UNUSUAL VAGINAL DISCHARGE OR ITCHING   Items with * indicate a potential emergency and should be followed up as soon as possible or go to the Emergency Department if any problems should occur.  Please show the CHEMOTHERAPY ALERT CARD or  IMMUNOTHERAPY ALERT CARD at check-in to the Emergency Department and triage nurse.  Should you have questions after your visit or need to cancel or reschedule your appointment, please contact [facility] [place] [phone]  and follow the prompts.  Office hours are 8:00 a.m. to 4:30 p.m. Monday - Friday. Please note that voicemails left after 4:00 p.m. may not be returned until the following business day.  We are closed weekends and major holidays. You have access to a nurse at all times for urgent questions. Please call the main number to the clinic [phone] and follow the prompts.  For any non-urgent questions, you may also contact your provider using MyChart. We now offer e-Visits for anyone 18 and older to request care online for non-urgent symptoms. For details visit [email or web address].   Also download the MyChart app! Go to the app store, search "MyChart", open the app, select [place], and log in with your MyChart username and password.   

## 2022-12-14 ENCOUNTER — Encounter: Payer: Self-pay | Admitting: Hematology

## 2022-12-21 ENCOUNTER — Other Ambulatory Visit: Payer: Self-pay

## 2022-12-21 DIAGNOSIS — C787 Secondary malignant neoplasm of liver and intrahepatic bile duct: Secondary | ICD-10-CM

## 2022-12-22 ENCOUNTER — Other Ambulatory Visit: Payer: No Typology Code available for payment source

## 2022-12-22 ENCOUNTER — Inpatient Hospital Stay: Payer: No Typology Code available for payment source

## 2022-12-22 ENCOUNTER — Ambulatory Visit: Payer: No Typology Code available for payment source | Admitting: Hematology

## 2022-12-22 ENCOUNTER — Inpatient Hospital Stay: Payer: No Typology Code available for payment source | Admitting: Hematology

## 2022-12-23 ENCOUNTER — Other Ambulatory Visit: Payer: Self-pay

## 2022-12-24 ENCOUNTER — Inpatient Hospital Stay: Payer: No Typology Code available for payment source

## 2023-01-05 NOTE — Progress Notes (Signed)
[facility] [address], [place] [address]    Clinic Day:  01/05/2023  Referring physician: [name], MD  Patient Care Team: Patient, No Pcp Per as PCP - General (General Practice) Therese Sarah, RN as Oncology Nurse Navigator (Oncology)   ASSESSMENT & PLAN:   Assessment: 1.  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 -CT chest on 05/02/2020 with no evidence of pulmonary metastatic disease. -CTAP on 05/07/2020 with multiple lesions throughout the liver, largest in the right lobe measuring 3.9 cm, lateral segment left lobe measuring 2.6 cm and inferior right lobe confluent lesion 4.2 x 3.7 cm.  No lymphadenopathy. -CEA on 05/02/2020-60.2. -FOLFOX and bevacizumab started on 06/04/2020. 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   2.  Iron deficiency anemia: -Feraheme on 04/30/2020 and 05/06/2020.   3.  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 -Smoked for 3 to 4 years and quit. -Mother had cancer, type unknown.  Maternal uncle had lung cancer.  Maternal aunt had lung cancer.    Plan: 1.  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 - Reviewed labs today: AST mildly elevated at 42.  Other LFTs are normal.  CBC normal with mild thrombocytopenia.  CEA is 4.2. - CT CAP on 11/06/2022: Stable disease. - Recommend continue maintenance therapy every 2 weeks.  RTC 6 weeks for follow-up.   2.  Hypertension: - She is taking triamterene HCTZ and Norvasc 5 mg daily.  Blood pressure is elevated. - Will increase Norvasc to 10 mg daily.   3.  Severe  hypokalemia: - Continue potassium 10 mEq 2 tablets daily.  Potassium is 3.1.   4.  Peripheral neuropathy: - Continue gabapentin 100 mg 3 times daily.   5.  Anxiety: - Continue Klonopin 1 mg 3 times daily and Paxil 40 mg daily.  6.  Mucositis: - Continue Vaseline and lidocaine gel as needed.    Orders Placed This Encounter  Procedures   CT CHEST ABDOMEN PELVIS W CONTRAST    Standing Status:   Future    Standing Expiration Date:   01/05/2024    Order Specific Question:   If indicated for the ordered procedure, I authorize the administration of contrast media per Radiology protocol    Answer:   Yes    Order Specific Question:   Does the patient have a contrast media/X-ray dye allergy?    Answer:   No    Order Specific Question:   Preferred imaging location?    Answer:   [facility]    Order Specific Question:   If indicated for the ordered procedure, I authorize the administration of oral contrast media per Radiology protocol    Answer:   Yes       I,Helena R Teague,acting as a scribe for [name], MD.,have documented all relevant documentation on the behalf of [name], MD,as directed by  [name], MD while in the presence of [name], MD.  ***   Danielson R Teague   7/9/20248:41 PM  CHIEF COMPLAINT:   Diagnosis: metastatic colon cancer to liver    Cancer Staging  No matching staging information was found for the patient.   Prior Therapy: 1.  Sigmoid colectomy and end colostomy on 04/29/2020  2. FOLFOX with bevacizumab, 06/04/20 - 11/06/20  Current Therapy:  Maintenance 5-FU and bevacizumab    HISTORY OF PRESENT ILLNESS:   Oncology History  Metastatic colon cancer to liver (HCC)  05/16/2020 Initial Diagnosis   Metastatic colon cancer to liver (HCC)   06/03/2020 Genetic Testing   Foundation One:     06/04/2020 - 02/25/2022 Chemotherapy   Patient is on Treatment Plan : COLORECTAL FOLFOX + Bevacizumab q14d      06/04/2020 -  Chemotherapy   Patient is on Treatment Plan : COLORECTAL FOLFOX + Bevacizumab q14d        INTERVAL HISTORY:   Susan Martin is a 62 y.o. female presenting to clinic today for follow up of metastatic colon cancer to liver. She was last seen by me on 11/11/22.  Today, she states that she is doing well overall. Her appetite level is at ***%. Her energy level is at ***%.  PAST MEDICAL HISTORY:   Past Medical History: Past Medical History:  Diagnosis Date   Adenocarcinoma of colon metastatic to liver [facility]) onocology--- dr s. Ellin Saba   04-29-2020 emergerency surgery for perforated colon s/p sigmoid colectomy w/ colostomy; dx  Stage IV colon cancer mets to liver   Anxiety    Depression    Hypertension    followed by dr Emelda Fear and oncology  (05-27-2020 per pt does not have pcp yet)   IDA (iron deficiency anemia)    Pulmonary nodule, right     Surgical History: Past Surgical History:  Procedure Laterality Date   ABDOMINAL HYSTERECTOMY  03-31-2016   @[facility]    W/  BILATERAL SALPINOOPHORECTOMY    APPLICATION OF WOUND VAC N/A 04/29/2020   Procedure: APPLICATION OF WOUND VAC;  Surgeon: Rodman Pickle, MD;  Location: [facility] OR;  Service: General;  Laterality: N/A;   ENDOMETRIAL ABLATION     LAPAROTOMY N/A 04/29/2020   Procedure: EXPLORATORY LAPAROTOMY;  Surgeon: Rodman Pickle, MD;  Location: [facility] OR;  Service: General;  Laterality: N/A;   PARTIAL COLECTOMY N/A 04/29/2020   Procedure: PARTIAL COLECTOMY WITH END COLOSTOMY;  Surgeon: Sheliah Hatch De Blanch, MD;  Location: [facility] OR;  Service: General;  Laterality: N/A;   PORTACATH PLACEMENT Right 05/28/2020   Procedure: INSERTION PORT-A-CATH;  Surgeon: Kinsinger, De Blanch, MD;  Location: [facility];  Service: General;  Laterality: Right;   SALPINGOOPHORECTOMY Left 03/31/2016   Procedure: LEFT SALPINGO OOPHORECTOMY WITH FROZEN SECTION,  ABDOMINAL HYSTERECTOMY WITH BILATERAL SALPINGO-OOPHORECTOMY;  Surgeon: Tilda Burrow, MD;  Location: [facility];  Service: Gynecology;  Laterality: Left;    Social History: Social History   Socioeconomic History   Marital status: Divorced    Spouse name: Not on file   Number of children: Not on file   Years of education: Not on file   Highest education level: Not on file  Occupational History   Not on file  Tobacco Use   Smoking status: Former    Packs/day: 0.50    Years: 5.00    Additional pack years: 0.00    Total pack years: 2.50    Types: Cigarettes    Quit date: 03/28/2015    Years since quitting: 7.7   Smokeless tobacco: Never  Vaping Use   Vaping Use: Never used  Substance and Sexual Activity   Alcohol use: No   Drug use: Never   Sexual activity: Yes    Partners: Male    Birth control/protection: Surgical  Other Topics Concern  Not on file  Social History Narrative   Not on file   Social Determinants of Health   Financial Resource Strain: Low Risk  (11/01/2019)   Overall Financial Resource Strain (CARDIA)    Difficulty of Paying Living Expenses: Not very hard  Food Insecurity: No Food Insecurity (11/01/2019)   Hunger Vital Sign    Worried About Running Out of Food in the Last Year: Never true    Ran Out of Food in the Last Year: Never true  Transportation Needs: No Transportation Needs (11/01/2019)   PRAPARE - Administrator, Civil Service (Medical): No    Lack of Transportation (Non-Medical): No  Physical Activity: Insufficiently Active (11/01/2019)   Exercise Vital Sign    Days of Exercise per Week: 2 days    Minutes of Exercise per Session: 30 min  Stress: Stress Concern Present (11/01/2019)   [employer] of Occupational Health - Occupational Stress Questionnaire    Feeling of Stress : To some extent  Social Connections: Moderately Integrated (11/01/2019)   Social Connection and Isolation Panel [NHANES]    Frequency of Communication with Friends and Family: More than three times a week    Frequency of Social Gatherings  with Friends and Family: Once a week    Attends Religious Services: More than 4 times per year    Active Member of [employer] or Organizations: No    Attends Banker Meetings: Never    Marital Status: Living with partner  Intimate Partner Violence: Not At Risk (11/01/2019)   Humiliation, Afraid, Rape, and Kick questionnaire    Fear of Current or Ex-Partner: No    Emotionally Abused: No    Physically Abused: No    Sexually Abused: No    Family History: Family History  Problem Relation Age of Onset   Hypertension Mother    Diabetes Mother    Cirrhosis Father     Current Medications:  Current Outpatient Medications:    amLODipine (NORVASC) 10 MG tablet, Take 1 tablet (10 mg total) by mouth daily., Disp: 30 tablet, Rfl: 6   BEVACIZUMAB IV, Inject 5 mg/kg into the vein every 14 (fourteen) days., Disp: , Rfl:    clonazePAM (KLONOPIN) 1 MG tablet, Take 1 tablet (1 mg total) by mouth in the morning, at noon, and at bedtime., Disp: 90 tablet, Rfl: 3   fluorouracil CALGB 78295 in sodium chloride 0.9 % 150 mL, Inject 2,400 mg/m2 into the vein over 48 hr., Disp: , Rfl:    gabapentin (NEURONTIN) 100 MG capsule, TAKE 1 CAPSULE (100 MG TOTAL) BY MOUTH THREE TIMES DAILY., Disp: 90 capsule, Rfl: 3   LEUCOVORIN CALCIUM IV, Inject 400 mg/m2 into the vein every 21 ( twenty-one) days., Disp: , Rfl:    lidocaine (XYLOCAINE) 2 % solution, Use as directed 15 mLs in the mouth or throat every 6 (six) hours as needed for mouth pain. Swish and spit/swallow every six hours as needed for mouth pain, Disp: 480 mL, Rfl: 3   nystatin (MYCOSTATIN) 100000 UNIT/ML suspension, Take by mouth., Disp: , Rfl:    oxyCODONE (OXY IR/ROXICODONE) 5 MG immediate release tablet, Take 5 mg by mouth every 6 (six) hours as needed. for pain, Disp: , Rfl:    PARoxetine (PAXIL) 40 MG tablet, Take 1 tablet (40 mg total) by mouth every morning., Disp: 90 tablet, Rfl: 3   polyethylene glycol (MIRALAX / GLYCOLAX) packet, Take 17 g by  mouth every other day., Disp: , Rfl:    potassium  chloride (KLOR-CON M10) 10 MEQ tablet, Take 2 tablets (20 mEq total) by mouth daily., Disp: 180 tablet, Rfl: 3   triamterene-hydrochlorothiazide (MAXZIDE-25) 37.5-25 MG tablet, TAKE 1 TABLET BY MOUTH EVERY DAY, Disp: 90 tablet, Rfl: 1   Allergies: No Known Allergies  REVIEW OF SYSTEMS:   Review of Systems  Constitutional:  Negative for chills, fatigue and fever.  HENT:   Negative for lump/mass, mouth sores, nosebleeds, sore throat and trouble swallowing.   Eyes:  Negative for eye problems.  Respiratory:  Negative for cough and shortness of breath.   Cardiovascular:  Negative for chest pain, leg swelling and palpitations.  Gastrointestinal:  Negative for abdominal pain, constipation, diarrhea, nausea and vomiting.  Genitourinary:  Negative for bladder incontinence, difficulty urinating, dysuria, frequency, hematuria and nocturia.   Musculoskeletal:  Negative for arthralgias, back pain, flank pain, myalgias and neck pain.  Skin:  Negative for itching and rash.  Neurological:  Negative for dizziness, headaches and numbness.  Hematological:  Does not bruise/bleed easily.  Psychiatric/Behavioral:  Negative for depression, sleep disturbance and suicidal ideas. The patient is not nervous/anxious.   All other systems reviewed and are negative.    VITALS:   There were no vitals taken for this visit.  Wt Readings from Last 3 Encounters:  12/09/22 186 lb 9.6 oz (84.6 kg)  11/25/22 185 lb (83.9 kg)  11/11/22 186 lb (84.4 kg)    There is no height or weight on file to calculate BMI.  Performance status (ECOG): 1 - Symptomatic but completely ambulatory  PHYSICAL EXAM:   Physical Exam Vitals and nursing note reviewed. Exam conducted with a chaperone present.  Constitutional:      Appearance: Normal appearance.  Cardiovascular:     Rate and Rhythm: Normal rate and regular rhythm.     Pulses: Normal pulses.     Heart sounds: Normal heart  sounds.  Pulmonary:     Effort: Pulmonary effort is normal.     Breath sounds: Normal breath sounds.  Abdominal:     Palpations: Abdomen is soft. There is no hepatomegaly, splenomegaly or mass.     Tenderness: There is no abdominal tenderness.  Musculoskeletal:     Right lower leg: No edema.     Left lower leg: No edema.  Lymphadenopathy:     Cervical: No cervical adenopathy.     Right cervical: No superficial, deep or posterior cervical adenopathy.    Left cervical: No superficial, deep or posterior cervical adenopathy.     Upper Body:     Right upper body: No supraclavicular or axillary adenopathy.     Left upper body: No supraclavicular or axillary adenopathy.  Neurological:     General: No focal deficit present.     Mental Status: She is alert and oriented to person, place, and time.  Psychiatric:        Mood and Affect: Mood normal.        Behavior: Behavior normal.     LABS:      Latest Ref Rng & Units 12/09/2022   10:26 AM 11/25/2022    8:19 AM 11/11/2022    8:53 AM  CBC  WBC 4.0 - 10.5 K/uL 5.6  6.6  5.6   Hemoglobin 12.0 - 15.0 g/dL 09.8  11.9  14.7   Hematocrit 36.0 - 46.0 % 42.3  40.1  39.5   Platelets 150 - 400 K/uL 104  104  113       Latest Ref Rng & Units 12/09/2022  10:26 AM 11/25/2022    8:19 AM 11/11/2022    8:53 AM  CMP  Glucose 70 - 99 mg/dL 161  096  045   BUN 8 - 23 mg/dL 14  13  8    Creatinine 0.44 - 1.00 mg/dL 4.09  8.11  9.14   Sodium 135 - 145 mmol/L 134  133  133   Potassium 3.5 - 5.1 mmol/L 3.0  3.1  3.1   Chloride 98 - 111 mmol/L 100  97  97   CO2 22 - 32 mmol/L 24  21  25    Calcium 8.9 - 10.3 mg/dL 8.8  8.9  8.7   Total Protein 6.5 - 8.1 g/dL 7.4  7.5  7.0   Total Bilirubin 0.3 - 1.2 mg/dL 1.3  1.4  1.1   Alkaline Phos 38 - 126 U/L 135  124  99   AST 15 - 41 U/L 52  54  42   ALT 0 - 44 U/L 45  52  40      Lab Results  Component Value Date   CEA1 7.4 (H) 11/11/2022   /  CEA  Date Value Ref Range Status  11/11/2022 7.4 (H) 0.0  - 4.7 ng/mL Final    Comment:    (NOTE)                             Nonsmokers          <3.9                             Smokers             <5.6 Roche Diagnostics Electrochemiluminescence Immunoassay (ECLIA) Values obtained with different assay methods or kits cannot be used interchangeably.  Results cannot be interpreted as absolute evidence of the presence or absence of malignant disease. Performed At: [facility] [address] [place], [place] [id number] Jolene Schimke MD YQ:[id number]    No results found for: "PSA1" No results found for: "CAN199" No results found for: "CAN125"  No results found for: "TOTALPROTELP", "ALBUMINELP", "A1GS", "A2GS", "BETS", "BETA2SER", "GAMS", "MSPIKE", "SPEI" Lab Results  Component Value Date   TIBC 248 (L) 04/29/2020   FERRITIN 57 04/29/2020   IRONPCTSAT 3 (L) 04/29/2020   No results found for: "LDH"   STUDIES:   No results found.

## 2023-01-06 ENCOUNTER — Other Ambulatory Visit: Payer: Self-pay

## 2023-01-06 ENCOUNTER — Inpatient Hospital Stay: Payer: No Typology Code available for payment source | Attending: Hematology

## 2023-01-06 ENCOUNTER — Inpatient Hospital Stay: Payer: No Typology Code available for payment source

## 2023-01-06 ENCOUNTER — Inpatient Hospital Stay (HOSPITAL_BASED_OUTPATIENT_CLINIC_OR_DEPARTMENT_OTHER): Payer: No Typology Code available for payment source | Admitting: Hematology

## 2023-01-06 DIAGNOSIS — C187 Malignant neoplasm of sigmoid colon: Secondary | ICD-10-CM | POA: Insufficient documentation

## 2023-01-06 DIAGNOSIS — K1230 Oral mucositis (ulcerative), unspecified: Secondary | ICD-10-CM | POA: Diagnosis not present

## 2023-01-06 DIAGNOSIS — Z801 Family history of malignant neoplasm of trachea, bronchus and lung: Secondary | ICD-10-CM | POA: Insufficient documentation

## 2023-01-06 DIAGNOSIS — Z95828 Presence of other vascular implants and grafts: Secondary | ICD-10-CM

## 2023-01-06 DIAGNOSIS — G629 Polyneuropathy, unspecified: Secondary | ICD-10-CM | POA: Diagnosis not present

## 2023-01-06 DIAGNOSIS — Z87891 Personal history of nicotine dependence: Secondary | ICD-10-CM | POA: Diagnosis not present

## 2023-01-06 DIAGNOSIS — C787 Secondary malignant neoplasm of liver and intrahepatic bile duct: Secondary | ICD-10-CM | POA: Insufficient documentation

## 2023-01-06 DIAGNOSIS — C189 Malignant neoplasm of colon, unspecified: Secondary | ICD-10-CM

## 2023-01-06 DIAGNOSIS — E876 Hypokalemia: Secondary | ICD-10-CM | POA: Insufficient documentation

## 2023-01-06 DIAGNOSIS — Z809 Family history of malignant neoplasm, unspecified: Secondary | ICD-10-CM | POA: Insufficient documentation

## 2023-01-06 DIAGNOSIS — D509 Iron deficiency anemia, unspecified: Secondary | ICD-10-CM | POA: Diagnosis not present

## 2023-01-06 DIAGNOSIS — I10 Essential (primary) hypertension: Secondary | ICD-10-CM | POA: Diagnosis not present

## 2023-01-06 DIAGNOSIS — F419 Anxiety disorder, unspecified: Secondary | ICD-10-CM | POA: Diagnosis not present

## 2023-01-06 DIAGNOSIS — Z5111 Encounter for antineoplastic chemotherapy: Secondary | ICD-10-CM | POA: Diagnosis not present

## 2023-01-06 LAB — CBC WITH DIFFERENTIAL/PLATELET
Abs Immature Granulocytes: 0.04 10*3/uL (ref 0.00–0.07)
Basophils Absolute: 0 10*3/uL (ref 0.0–0.1)
Basophils Relative: 1 %
Eosinophils Absolute: 0.2 10*3/uL (ref 0.0–0.5)
Eosinophils Relative: 3 %
HCT: 42.3 % (ref 36.0–46.0)
Hemoglobin: 14.1 g/dL (ref 12.0–15.0)
Immature Granulocytes: 1 %
Lymphocytes Relative: 23 %
Lymphs Abs: 1.7 10*3/uL (ref 0.7–4.0)
MCH: 30.9 pg (ref 26.0–34.0)
MCHC: 33.3 g/dL (ref 30.0–36.0)
MCV: 92.6 fL (ref 80.0–100.0)
Monocytes Absolute: 0.8 10*3/uL (ref 0.1–1.0)
Monocytes Relative: 10 %
Neutro Abs: 4.7 10*3/uL (ref 1.7–7.7)
Neutrophils Relative %: 62 %
Platelets: 136 10*3/uL — ABNORMAL LOW (ref 150–400)
RBC: 4.57 MIL/uL (ref 3.87–5.11)
RDW: 15.9 % — ABNORMAL HIGH (ref 11.5–15.5)
WBC: 7.4 10*3/uL (ref 4.0–10.5)
nRBC: 0 % (ref 0.0–0.2)

## 2023-01-06 LAB — COMPREHENSIVE METABOLIC PANEL
ALT: 33 U/L (ref 0–44)
AST: 47 U/L — ABNORMAL HIGH (ref 15–41)
Albumin: 3.5 g/dL (ref 3.5–5.0)
Alkaline Phosphatase: 142 U/L — ABNORMAL HIGH (ref 38–126)
Anion gap: 12 (ref 5–15)
BUN: 18 mg/dL (ref 8–23)
CO2: 24 mmol/L (ref 22–32)
Calcium: 9 mg/dL (ref 8.9–10.3)
Chloride: 96 mmol/L — ABNORMAL LOW (ref 98–111)
Creatinine, Ser: 0.93 mg/dL (ref 0.44–1.00)
GFR, Estimated: >60 mL/min (ref >60)
Glucose, Bld: 152 mg/dL — ABNORMAL HIGH (ref 70–99)
Potassium: 3.2 mmol/L — ABNORMAL LOW (ref 3.5–5.1)
Sodium: 132 mmol/L — ABNORMAL LOW (ref 135–145)
Total Bilirubin: 1.1 mg/dL (ref 0.3–1.2)
Total Protein: 7.7 g/dL (ref 6.5–8.1)

## 2023-01-06 LAB — URINALYSIS, DIPSTICK ONLY
Bilirubin Urine: NEGATIVE
Glucose, UA: NEGATIVE mg/dL
Hgb urine dipstick: NEGATIVE
Ketones, ur: NEGATIVE mg/dL
Leukocytes,Ua: NEGATIVE
Nitrite: NEGATIVE
Protein, ur: 100 mg/dL — AB
Specific Gravity, Urine: 1.013 (ref 1.005–1.030)
pH: 5 (ref 5.0–8.0)

## 2023-01-06 LAB — MAGNESIUM: Magnesium: 1.9 mg/dL (ref 1.7–2.4)

## 2023-01-06 MED ORDER — LIDOCAINE-PRILOCAINE 2.5-2.5 % EX CREA
1.0000 | TOPICAL_CREAM | CUTANEOUS | 0 refills | Status: DC | PRN
Start: 2023-01-06 — End: 2024-02-15

## 2023-01-06 MED ORDER — HEPARIN SOD (PORK) LOCK FLUSH 100 UNIT/ML IV SOLN: 500.0000 [IU] | Freq: Once | INTRAVENOUS | Status: DC | PRN

## 2023-01-06 MED ORDER — PALONOSETRON HCL INJECTION 0.25 MG/5ML
0.2500 mg | Freq: Once | INTRAVENOUS | Status: AC
  Administered 2023-01-06: 0.25 mg via INTRAVENOUS
  Filled 2023-01-06: qty 5

## 2023-01-06 MED ORDER — SODIUM CHLORIDE 0.9 % IV SOLN: Freq: Once | INTRAVENOUS | Status: AC

## 2023-01-06 MED ORDER — FLUOROURACIL CHEMO INJECTION 500 MG/10ML
270.0000 mg/m2 | Freq: Once | INTRAVENOUS | Status: AC
  Administered 2023-01-06: 500 mg via INTRAVENOUS
  Filled 2023-01-06: qty 10

## 2023-01-06 MED ORDER — SODIUM CHLORIDE 0.9 % IV SOLN
3250.0000 mg | INTRAVENOUS | Status: DC
  Administered 2023-01-06: 3250 mg via INTRAVENOUS
  Filled 2023-01-06: qty 65

## 2023-01-06 MED ORDER — SODIUM CHLORIDE 0.9 % IV SOLN
10.0000 mg | Freq: Once | INTRAVENOUS | Status: AC
  Administered 2023-01-06: 10 mg via INTRAVENOUS
  Filled 2023-01-06: qty 10

## 2023-01-06 MED ORDER — SODIUM CHLORIDE 0.9 % IV SOLN
5.0000 mg/kg | Freq: Once | INTRAVENOUS | Status: AC
  Administered 2023-01-06: 400 mg via INTRAVENOUS
  Filled 2023-01-06: qty 16

## 2023-01-06 MED ORDER — POTASSIUM CHLORIDE CRYS ER 20 MEQ PO TBCR
40.0000 meq | EXTENDED_RELEASE_TABLET | Freq: Once | ORAL | Status: AC
  Administered 2023-01-06: 40 meq via ORAL
  Filled 2023-01-06: qty 2

## 2023-01-06 MED ORDER — SODIUM CHLORIDE 0.9% FLUSH
10.0000 mL | Freq: Once | INTRAVENOUS | Status: AC
  Administered 2023-01-06: 10 mL via INTRAVENOUS

## 2023-01-06 MED ORDER — SODIUM CHLORIDE 0.9% FLUSH: 10.0000 mL | INTRAVENOUS | Status: DC | PRN

## 2023-01-06 MED ORDER — SODIUM CHLORIDE 0.9 % IV SOLN
320.0000 mg/m2 | Freq: Once | INTRAVENOUS | Status: AC
  Administered 2023-01-06: 628 mg via INTRAVENOUS
  Filled 2023-01-06: qty 31.4

## 2023-01-06 NOTE — Patient Instructions (Signed)
[facility]  Discharge Instructions: Thank you for choosing [facility] to provide your oncology and hematology care.  If you have a lab appointment with the Cancer Center - please note that after April 8th, 2024, all labs will be drawn in the cancer center.  You do not have to check in or register with the main entrance as you have in the past but will complete your check-in in the cancer center.  Wear comfortable clothing and clothing appropriate for easy access to any Portacath or PICC line.   We strive to give you quality time with your provider. You may need to reschedule your appointment if you arrive late (15 or more minutes).  Arriving late affects you and other patients whose appointments are after yours.  Also, if you miss three or more appointments without notifying the office, you may be dismissed from the clinic at the provider's discretion.      For prescription refill requests, have your pharmacy contact our office and allow 72 hours for refills to be completed.    Today you received the following chemotherapy and/or immunotherapy agents: bevacizumab, leucovorin, and fluorouracil      To help prevent nausea and vomiting after your treatment, we encourage you to take your nausea medication as directed.  BELOW ARE SYMPTOMS THAT SHOULD BE REPORTED IMMEDIATELY: *FEVER GREATER THAN 100.4 F (38 C) OR HIGHER *CHILLS OR SWEATING *NAUSEA AND VOMITING THAT IS NOT CONTROLLED WITH YOUR NAUSEA MEDICATION *UNUSUAL SHORTNESS OF BREATH *UNUSUAL BRUISING OR BLEEDING *URINARY PROBLEMS (pain or burning when urinating, or frequent urination) *BOWEL PROBLEMS (unusual diarrhea, constipation, pain near the anus) TENDERNESS IN MOUTH AND THROAT WITH OR WITHOUT PRESENCE OF ULCERS (sore throat, sores in mouth, or a toothache) UNUSUAL RASH, SWELLING OR PAIN  UNUSUAL VAGINAL DISCHARGE OR ITCHING   Items with * indicate a potential emergency and should be followed up  as soon as possible or go to the Emergency Department if any problems should occur.  Please show the CHEMOTHERAPY ALERT CARD or IMMUNOTHERAPY ALERT CARD at check-in to the Emergency Department and triage nurse.  Should you have questions after your visit or need to cancel or reschedule your appointment, please contact [facility] [phone]  and follow the prompts.  Office hours are 8:00 a.m. to 4:30 p.m. Monday - Friday. Please note that voicemails left after 4:00 p.m. may not be returned until the following business day.  We are closed weekends and major holidays. You have access to a nurse at all times for urgent questions. Please call the main number to the clinic [phone] and follow the prompts.  For any non-urgent questions, you may also contact your provider using MyChart. We now offer e-Visits for anyone 81 and older to request care online for non-urgent symptoms. For details visit [email or web address].   Also download the MyChart app! Go to the app store, search "MyChart", open the app, select [place], and log in with your MyChart username and password.

## 2023-01-06 NOTE — Patient Instructions (Signed)
[facility] at [facility] Discharge Instructions   You were seen and examined today by Dr. Katragadda.  He reviewed the results of your lab work which are normal/stable.   We will proceed with your treatment today.  Return as scheduled.    Thank you for choosing [facility] at [facility] to provide your oncology and hematology care.  To afford each patient quality time with our provider, please arrive at least 15 minutes before your scheduled appointment time.   If you have a lab appointment with the Cancer Center please come in thru the Main Entrance and check in at the main information desk.  You need to re-schedule your appointment should you arrive 10 or more minutes late.  We strive to give you quality time with our providers, and arriving late affects you and other patients whose appointments are after yours.  Also, if you no show three or more times for appointments you may be dismissed from the clinic at the providers discretion.     Again, thank you for choosing [facility].  Our hope is that these requests will decrease the amount of time that you wait before being seen by our physicians.       _____________________________________________________________  Should you have questions after your visit to [facility], please contact our office at [phone] and follow the prompts.  Our office hours are 8:00 a.m. and 4:30 p.m. Monday - Friday.  Please note that voicemails left after 4:00 p.m. may not be returned until the following business day.  We are closed weekends and major holidays.  You do have access to a nurse 24-7, just call the main number to the clinic [phone] and do not press any options, hold on the line and a nurse will answer the phone.    For prescription refill requests, have your pharmacy contact our office and allow 72 hours.    Due to Covid, you will need to wear a mask upon entering  the hospital. If you do not have a mask, a mask will be given to you at the Main Entrance upon arrival. For doctor visits, patients may have 1 support person age 18 or older with them. For treatment visits, patients can not have anyone with them due to social distancing guidelines and our immunocompromised population.      

## 2023-01-06 NOTE — Progress Notes (Signed)
Patient has been examined by Dr. Katragadda. Vital signs and labs have been reviewed by MD - ANC, Creatinine, LFTs, hemoglobin, and platelets are within treatment parameters per M.D. - pt may proceed with treatment.  Primary RN and pharmacy notified.  

## 2023-01-07 ENCOUNTER — Other Ambulatory Visit: Payer: Self-pay

## 2023-01-07 ENCOUNTER — Encounter: Payer: Self-pay | Admitting: Hematology

## 2023-01-07 LAB — CEA: CEA: 11.8 ng/mL — ABNORMAL HIGH (ref 0.0–4.7)

## 2023-01-08 ENCOUNTER — Inpatient Hospital Stay: Payer: No Typology Code available for payment source

## 2023-01-08 VITALS — BP 166/74 | HR 70 | Temp 96.6°F | Resp 18

## 2023-01-08 DIAGNOSIS — Z5111 Encounter for antineoplastic chemotherapy: Secondary | ICD-10-CM | POA: Diagnosis not present

## 2023-01-08 DIAGNOSIS — C189 Malignant neoplasm of colon, unspecified: Secondary | ICD-10-CM

## 2023-01-08 MED ORDER — SODIUM CHLORIDE 0.9% FLUSH
10.0000 mL | INTRAVENOUS | Status: DC | PRN
  Administered 2023-01-08: 10 mL

## 2023-01-08 MED ORDER — HEPARIN SOD (PORK) LOCK FLUSH 100 UNIT/ML IV SOLN
500.0000 [IU] | Freq: Once | INTRAVENOUS | Status: AC | PRN
  Administered 2023-01-08: 500 [IU]

## 2023-01-08 NOTE — Patient Instructions (Signed)
[facility] [place]  Discharge Instructions: Thank you for choosing [facility] to provide your oncology and hematology care.  If you have a lab appointment with the Cancer Center - please note that after April 8th, 2024, all labs will be drawn in the cancer center.  You do not have to check in or register with the main entrance as you have in the past but will complete your check-in in the cancer center.  Wear comfortable clothing and clothing appropriate for easy access to any Portacath or PICC line.   We strive to give you quality time with your provider. You may need to reschedule your appointment if you arrive late (15 or more minutes).  Arriving late affects you and other patients whose appointments are after yours.  Also, if you miss three or more appointments without notifying the office, you may be dismissed from the clinic at the provider's discretion.      For prescription refill requests, have your pharmacy contact our office and allow 72 hours for refills to be completed.  To help prevent nausea and vomiting after your treatment, we encourage you to take your nausea medication as directed.  BELOW ARE SYMPTOMS THAT SHOULD BE REPORTED IMMEDIATELY: *FEVER GREATER THAN 100.4 F (38 C) OR HIGHER *CHILLS OR SWEATING *NAUSEA AND VOMITING THAT IS NOT CONTROLLED WITH YOUR NAUSEA MEDICATION *UNUSUAL SHORTNESS OF BREATH *UNUSUAL BRUISING OR BLEEDING *URINARY PROBLEMS (pain or burning when urinating, or frequent urination) *BOWEL PROBLEMS (unusual diarrhea, constipation, pain near the anus) TENDERNESS IN MOUTH AND THROAT WITH OR WITHOUT PRESENCE OF ULCERS (sore throat, sores in mouth, or a toothache) UNUSUAL RASH, SWELLING OR PAIN  UNUSUAL VAGINAL DISCHARGE OR ITCHING   Items with * indicate a potential emergency and should be followed up as soon as possible or go to the Emergency Department if any problems should occur.  Please show the CHEMOTHERAPY ALERT CARD or  IMMUNOTHERAPY ALERT CARD at check-in to the Emergency Department and triage nurse.  Should you have questions after your visit or need to cancel or reschedule your appointment, please contact [facility] [place] [phone]  and follow the prompts.  Office hours are 8:00 a.m. to 4:30 p.m. Monday - Friday. Please note that voicemails left after 4:00 p.m. may not be returned until the following business day.  We are closed weekends and major holidays. You have access to a nurse at all times for urgent questions. Please call the main number to the clinic [phone] and follow the prompts.  For any non-urgent questions, you may also contact your provider using MyChart. We now offer e-Visits for anyone 18 and older to request care online for non-urgent symptoms. For details visit [email or web address].   Also download the MyChart app! Go to the app store, search "MyChart", open the app, select [place], and log in with your MyChart username and password.   

## 2023-01-08 NOTE — Progress Notes (Signed)
Patient for chemotherapy pump disconnect with no complaints voiced.  Patients port flushed without difficulty.  Good blood return noted with no bruising or swelling noted at site.  Band aid applied.  VSS with discharge and left ambulatory with no s/s of distress noted.   

## 2023-01-20 ENCOUNTER — Inpatient Hospital Stay: Payer: No Typology Code available for payment source

## 2023-01-20 VITALS — BP 160/90 | HR 83 | Temp 98.2°F | Resp 19

## 2023-01-20 DIAGNOSIS — C189 Malignant neoplasm of colon, unspecified: Secondary | ICD-10-CM

## 2023-01-20 DIAGNOSIS — Z5111 Encounter for antineoplastic chemotherapy: Secondary | ICD-10-CM | POA: Diagnosis not present

## 2023-01-20 LAB — CBC WITH DIFFERENTIAL/PLATELET
Abs Immature Granulocytes: 0.01 10*3/uL (ref 0.00–0.07)
Basophils Absolute: 0 10*3/uL (ref 0.0–0.1)
Basophils Relative: 1 %
Eosinophils Absolute: 0.2 10*3/uL (ref 0.0–0.5)
Eosinophils Relative: 3 %
HCT: 41.5 % (ref 36.0–46.0)
Hemoglobin: 13.9 g/dL (ref 12.0–15.0)
Immature Granulocytes: 0 %
Lymphocytes Relative: 20 %
Lymphs Abs: 1.2 10*3/uL (ref 0.7–4.0)
MCH: 31 pg (ref 26.0–34.0)
MCHC: 33.5 g/dL (ref 30.0–36.0)
MCV: 92.4 fL (ref 80.0–100.0)
Monocytes Absolute: 0.5 10*3/uL (ref 0.1–1.0)
Monocytes Relative: 8 %
Neutro Abs: 4.1 10*3/uL (ref 1.7–7.7)
Neutrophils Relative %: 68 %
Platelets: 122 10*3/uL — ABNORMAL LOW (ref 150–400)
RBC: 4.49 MIL/uL (ref 3.87–5.11)
RDW: 16.2 % — ABNORMAL HIGH (ref 11.5–15.5)
WBC: 6 10*3/uL (ref 4.0–10.5)
nRBC: 0 % (ref 0.0–0.2)

## 2023-01-20 LAB — COMPREHENSIVE METABOLIC PANEL
ALT: 38 U/L (ref 0–44)
AST: 47 U/L — ABNORMAL HIGH (ref 15–41)
Albumin: 3.5 g/dL (ref 3.5–5.0)
Alkaline Phosphatase: 133 U/L — ABNORMAL HIGH (ref 38–126)
Anion gap: 9 (ref 5–15)
BUN: 14 mg/dL (ref 8–23)
CO2: 25 mmol/L (ref 22–32)
Calcium: 8.5 mg/dL — ABNORMAL LOW (ref 8.9–10.3)
Chloride: 98 mmol/L (ref 98–111)
Creatinine, Ser: 0.85 mg/dL (ref 0.44–1.00)
GFR, Estimated: >60 mL/min (ref >60)
Glucose, Bld: 150 mg/dL — ABNORMAL HIGH (ref 70–99)
Potassium: 3.1 mmol/L — ABNORMAL LOW (ref 3.5–5.1)
Sodium: 132 mmol/L — ABNORMAL LOW (ref 135–145)
Total Bilirubin: 1.3 mg/dL — ABNORMAL HIGH (ref 0.3–1.2)
Total Protein: 7.3 g/dL (ref 6.5–8.1)

## 2023-01-20 LAB — MAGNESIUM: Magnesium: 1.8 mg/dL (ref 1.7–2.4)

## 2023-01-20 MED ORDER — SODIUM CHLORIDE 0.9 % IV SOLN
3250.0000 mg | INTRAVENOUS | Status: DC
  Administered 2023-01-20: 3250 mg via INTRAVENOUS
  Filled 2023-01-20: qty 65

## 2023-01-20 MED ORDER — POTASSIUM CHLORIDE CRYS ER 20 MEQ PO TBCR
40.0000 meq | EXTENDED_RELEASE_TABLET | Freq: Once | ORAL | Status: AC
  Administered 2023-01-20: 40 meq via ORAL
  Filled 2023-01-20: qty 2

## 2023-01-20 MED ORDER — SODIUM CHLORIDE 0.9 % IV SOLN: Freq: Once | INTRAVENOUS | Status: AC

## 2023-01-20 MED ORDER — SODIUM CHLORIDE 0.9% FLUSH
10.0000 mL | Freq: Once | INTRAVENOUS | Status: AC
  Administered 2023-01-20: 10 mL via INTRAVENOUS

## 2023-01-20 MED ORDER — SODIUM CHLORIDE 0.9 % IV SOLN
320.0000 mg/m2 | Freq: Once | INTRAVENOUS | Status: AC
  Administered 2023-01-20: 628 mg via INTRAVENOUS
  Filled 2023-01-20: qty 31.4

## 2023-01-20 MED ORDER — SODIUM CHLORIDE 0.9 % IV SOLN
10.0000 mg | Freq: Once | INTRAVENOUS | Status: AC
  Administered 2023-01-20: 10 mg via INTRAVENOUS
  Filled 2023-01-20: qty 10

## 2023-01-20 MED ORDER — SODIUM CHLORIDE 0.9 % IV SOLN
5.0000 mg/kg | Freq: Once | INTRAVENOUS | Status: AC
  Administered 2023-01-20: 400 mg via INTRAVENOUS
  Filled 2023-01-20: qty 16

## 2023-01-20 MED ORDER — PALONOSETRON HCL INJECTION 0.25 MG/5ML
0.2500 mg | Freq: Once | INTRAVENOUS | Status: AC
  Administered 2023-01-20: 0.25 mg via INTRAVENOUS
  Filled 2023-01-20: qty 5

## 2023-01-20 MED ORDER — FLUOROURACIL CHEMO INJECTION 500 MG/10ML
270.0000 mg/m2 | Freq: Once | INTRAVENOUS | Status: AC
  Administered 2023-01-20: 500 mg via INTRAVENOUS
  Filled 2023-01-20: qty 10

## 2023-01-20 NOTE — Patient Instructions (Signed)
[facility]  Discharge Instructions: Thank you for choosing [facility] to provide your oncology and hematology care.  If you have a lab appointment with the Cancer Center - please note that after April 8th, 2024, all labs will be drawn in the cancer center.  You do not have to check in or register with the main entrance as you have in the past but will complete your check-in in the cancer center.  Wear comfortable clothing and clothing appropriate for easy access to any Portacath or PICC line.   We strive to give you quality time with your provider. You may need to reschedule your appointment if you arrive late (15 or more minutes).  Arriving late affects you and other patients whose appointments are after yours.  Also, if you miss three or more appointments without notifying the office, you may be dismissed from the clinic at the provider's discretion.      For prescription refill requests, have your pharmacy contact our office and allow 72 hours for refills to be completed.    Today you received the following chemotherapy and/or immunotherapy agents MVASI, Leucovorin, and Adrucil. Fluorouracil Injection What is this medication? FLUOROURACIL (flure oh YOOR a sil) treats some types of cancer. It works by slowing down the growth of cancer cells. This medicine may be used for other purposes; ask your health care provider or pharmacist if you have questions. COMMON BRAND NAME(S): Adrucil What should I tell my care team before I take this medication? They need to know if you have any of these conditions: Blood disorders Dihydropyrimidine dehydrogenase (DPD) deficiency Infection, such as chickenpox, cold sores, herpes Kidney disease Liver disease Poor nutrition Recent or ongoing radiation therapy An unusual or allergic reaction to fluorouracil, other medications, foods, dyes, or preservatives If you or your partner are pregnant or trying to get  pregnant Breast-feeding How should I use this medication? This medication is injected into a vein. It is administered by your care team in a hospital or clinic setting. Talk to your care team about the use of this medication in children. Special care may be needed. Overdosage: If you think you have taken too much of this medicine contact a poison control center or emergency room at once. NOTE: This medicine is only for you. Do not share this medicine with others. What if I miss a dose? Keep appointments for follow-up doses. It is important not to miss your dose. Call your care team if you are unable to keep an appointment. What may interact with this medication? Do not take this medication with any of the following: Live virus vaccines This medication may also interact with the following: Medications that treat or prevent blood clots, such as warfarin, enoxaparin, dalteparin This list may not describe all possible interactions. Give your health care provider a list of all the medicines, herbs, non-prescription drugs, or dietary supplements you use. Also tell them if you smoke, drink alcohol, or use illegal drugs. Some items may interact with your medicine. What should I watch for while using this medication? Your condition will be monitored carefully while you are receiving this medication. This medication may make you feel generally unwell. This is not uncommon as chemotherapy can affect healthy cells as well as cancer cells. Report any side effects. Continue your course of treatment even though you feel ill unless your care team tells you to stop. In some cases, you may be given additional medications to help with side effects.  Follow all directions for their use. This medication may increase your risk of getting an infection. Call your care team for advice if you get a fever, chills, sore throat, or other symptoms of a cold or flu. Do not treat yourself. Try to avoid being around people who are  sick. This medication may increase your risk to bruise or bleed. Call your care team if you notice any unusual bleeding. Be careful brushing or flossing your teeth or using a toothpick because you may get an infection or bleed more easily. If you have any dental work done, tell your dentist you are receiving this medication. Avoid taking medications that contain aspirin, acetaminophen, ibuprofen, naproxen, or ketoprofen unless instructed by your care team. These medications may hide a fever. Do not treat diarrhea with over the counter products. Contact your care team if you have diarrhea that lasts more than 2 days or if it is severe and watery. This medication can make you more sensitive to the sun. Keep out of the sun. If you cannot avoid being in the sun, wear protective clothing and sunscreen. Do not use sun lamps, tanning beds, or tanning booths. Talk to your care team if you or your partner wish to become pregnant or think you might be pregnant. This medication can cause serious birth defects if taken during pregnancy and for 3 months after the last dose. A reliable form of contraception is recommended while taking this medication and for 3 months after the last dose. Talk to your care team about effective forms of contraception. Do not father a child while taking this medication and for 3 months after the last dose. Use a condom while having sex during this time period. Do not breastfeed while taking this medication. This medication may cause infertility. Talk to your care team if you are concerned about your fertility. What side effects may I notice from receiving this medication? Side effects that you should report to your care team as soon as possible: Allergic reactions--skin rash, itching, hives, swelling of the face, lips, tongue, or throat Heart attack--pain or tightness in the chest, shoulders, arms, or jaw, nausea, shortness of breath, cold or clammy skin, feeling faint or  lightheaded Heart failure--shortness of breath, swelling of the ankles, feet, or hands, sudden weight gain, unusual weakness or fatigue Heart rhythm changes--fast or irregular heartbeat, dizziness, feeling faint or lightheaded, chest pain, trouble breathing High ammonia level--unusual weakness or fatigue, confusion, loss of appetite, nausea, vomiting, seizures Infection--fever, chills, cough, sore throat, wounds that don't heal, pain or trouble when passing urine, general feeling of discomfort or being unwell Low red blood cell level--unusual weakness or fatigue, dizziness, headache, trouble breathing Pain, tingling, or numbness in the hands or feet, muscle weakness, change in vision, confusion or trouble speaking, loss of balance or coordination, trouble walking, seizures Redness, swelling, and blistering of the skin over hands and feet Severe or prolonged diarrhea Unusual bruising or bleeding Side effects that usually do not require medical attention (report to your care team if they continue or are bothersome): Dry skin Headache Increased tears Nausea Pain, redness, or swelling with sores inside the mouth or throat Sensitivity to light Vomiting This list may not describe all possible side effects. Call your doctor for medical advice about side effects. You may report side effects to FDA at 1-800-FDA-1088. Where should I keep my medication? This medication is given in a hospital or clinic. It will not be stored at home. NOTE: This sheet is a summary.  It may not cover all possible information. If you have questions about this medicine, talk to your doctor, pharmacist, or health care provider.  2024 Elsevier/Gold Standard (2021-10-21 00:00:00) Leucovorin Injection What is this medication? LEUCOVORIN (loo koe VOR in) prevents side effects from certain medications, such as methotrexate. It works by increasing folate levels. This helps protect healthy cells in your body. It may also be used to  treat anemia caused by low levels of folate. It can also be used with fluorouracil, a type of chemotherapy, to treat colorectal cancer. It works by increasing the effects of fluorouracil in the body. This medicine may be used for other purposes; ask your health care provider or pharmacist if you have questions. What should I tell my care team before I take this medication? They need to know if you have any of these conditions: Anemia from low levels of vitamin B12 in the blood An unusual or allergic reaction to leucovorin, folic acid, other medications, foods, dyes, or preservatives Pregnant or trying to get pregnant Breastfeeding How should I use this medication? This medication is injected into a vein or a muscle. It is given by your care team in a hospital or clinic setting. Talk to your care team about the use of this medication in children. Special care may be needed. Overdosage: If you think you have taken too much of this medicine contact a poison control center or emergency room at once. NOTE: This medicine is only for you. Do not share this medicine with others. What if I miss a dose? Keep appointments for follow-up doses. It is important not to miss your dose. Call your care team if you are unable to keep an appointment. What may interact with this medication? Capecitabine Fluorouracil Phenobarbital Phenytoin Primidone Trimethoprim;sulfamethoxazole This list may not describe all possible interactions. Give your health care provider a list of all the medicines, herbs, non-prescription drugs, or dietary supplements you use. Also tell them if you smoke, drink alcohol, or use illegal drugs. Some items may interact with your medicine. What should I watch for while using this medication? Your condition will be monitored carefully while you are receiving this medication. This medication may increase the side effects of 5-fluorouracil. Tell your care team if you have diarrhea or mouth  sores that do not get better or that get worse. What side effects may I notice from receiving this medication? Side effects that you should report to your care team as soon as possible: Allergic reactions--skin rash, itching, hives, swelling of the face, lips, tongue, or throat This list may not describe all possible side effects. Call your doctor for medical advice about side effects. You may report side effects to FDA at 1-800-FDA-1088. Where should I keep my medication? This medication is given in a hospital or clinic. It will not be stored at home. NOTE: This sheet is a summary. It may not cover all possible information. If you have questions about this medicine, talk to your doctor, pharmacist, or health care provider.  2024 Elsevier/Gold Standard (2021-11-18 00:00:00) Bevacizumab Injection What is this medication? BEVACIZUMAB (be va SIZ yoo mab) treats some types of cancer. It works by blocking a protein that causes cancer cells to grow and multiply. This helps to slow or stop the spread of cancer cells. It is a monoclonal antibody. This medicine may be used for other purposes; ask your health care provider or pharmacist if you have questions. COMMON BRAND NAME(S): Alymsys, Avastin, MVASI, Omer Jack What should I tell my  care team before I take this medication? They need to know if you have any of these conditions: Blood clots Coughing up blood Having or recent surgery Heart failure High blood pressure History of a connection between 2 or more body parts that do not usually connect (fistula) History of a tear in your stomach or intestines Protein in your urine An unusual or allergic reaction to bevacizumab, other medications, foods, dyes, or preservatives Pregnant or trying to get pregnant Breast-feeding How should I use this medication? This medication is injected into a vein. It is given by your care team in a hospital or clinic setting. Talk to your care team the use of this  medication in children. Special care may be needed. Overdosage: If you think you have taken too much of this medicine contact a poison control center or emergency room at once. NOTE: This medicine is only for you. Do not share this medicine with others. What if I miss a dose? Keep appointments for follow-up doses. It is important not to miss your dose. Call your care team if you are unable to keep an appointment. What may interact with this medication? Interactions are not expected. This list may not describe all possible interactions. Give your health care provider a list of all the medicines, herbs, non-prescription drugs, or dietary supplements you use. Also tell them if you smoke, drink alcohol, or use illegal drugs. Some items may interact with your medicine. What should I watch for while using this medication? Your condition will be monitored carefully while you are receiving this medication. You may need blood work while taking this medication. This medication may make you feel generally unwell. This is not uncommon as chemotherapy can affect healthy cells as well as cancer cells. Report any side effects. Continue your course of treatment even though you feel ill unless your care team tells you to stop. This medication may increase your risk to bruise or bleed. Call your care team if you notice any unusual bleeding. Before having surgery, talk to your care team to make sure it is ok. This medication can increase the risk of poor healing of your surgical site or wound. You will need to stop this medication for 28 days before surgery. After surgery, wait at least 28 days before restarting this medication. Make sure the surgical site or wound is healed enough before restarting this medication. Talk to your care team if questions. Talk to your care team if you may be pregnant. Serious birth defects can occur if you take this medication during pregnancy and for 6 months after the last dose.  Contraception is recommended while taking this medication and for 6 months after the last dose. Your care team can help you find the option that works for you. Do not breastfeed while taking this medication and for 6 months after the last dose. This medication can cause infertility. Talk to your care team if you are concerned about your fertility. What side effects may I notice from receiving this medication? Side effects that you should report to your care team as soon as possible: Allergic reactions--skin rash, itching, hives, swelling of the face, lips, tongue, or throat Bleeding--bloody or black, tar-like stools, vomiting blood or brown material that looks like coffee grounds, red or dark brown urine, small red or purple spots on skin, unusual bruising or bleeding Blood clot--pain, swelling, or warmth in the leg, shortness of breath, chest pain Heart attack--pain or tightness in the chest, shoulders, arms, or jaw, nausea,  shortness of breath, cold or clammy skin, feeling faint or lightheaded Heart failure--shortness of breath, swelling of the ankles, feet, or hands, sudden weight gain, unusual weakness or fatigue Increase in blood pressure Infection--fever, chills, cough, sore throat, wounds that don't heal, pain or trouble when passing urine, general feeling of discomfort or being unwell Infusion reactions--chest pain, shortness of breath or trouble breathing, feeling faint or lightheaded Kidney injury--decrease in the amount of urine, swelling of the ankles, hands, or feet Stomach pain that is severe, does not go away, or gets worse Stroke--sudden numbness or weakness of the face, arm, or leg, trouble speaking, confusion, trouble walking, loss of balance or coordination, dizziness, severe headache, change in vision Sudden and severe headache, confusion, change in vision, seizures, which may be signs of posterior reversible encephalopathy syndrome (PRES) Side effects that usually do not require  medical attention (report to your care team if they continue or are bothersome): Back pain Change in taste Diarrhea Dry skin Increased tears Nosebleed This list may not describe all possible side effects. Call your doctor for medical advice about side effects. You may report side effects to FDA at 1-800-FDA-1088. Where should I keep my medication? This medication is given in a hospital or clinic. It will not be stored at home. NOTE: This sheet is a summary. It may not cover all possible information. If you have questions about this medicine, talk to your doctor, pharmacist, or health care provider.  2024 Elsevier/Gold Standard (2021-10-31 00:00:00)       To help prevent nausea and vomiting after your treatment, we encourage you to take your nausea medication as directed.  BELOW ARE SYMPTOMS THAT SHOULD BE REPORTED IMMEDIATELY: *FEVER GREATER THAN 100.4 F (38 C) OR HIGHER *CHILLS OR SWEATING *NAUSEA AND VOMITING THAT IS NOT CONTROLLED WITH YOUR NAUSEA MEDICATION *UNUSUAL SHORTNESS OF BREATH *UNUSUAL BRUISING OR BLEEDING *URINARY PROBLEMS (pain or burning when urinating, or frequent urination) *BOWEL PROBLEMS (unusual diarrhea, constipation, pain near the anus) TENDERNESS IN MOUTH AND THROAT WITH OR WITHOUT PRESENCE OF ULCERS (sore throat, sores in mouth, or a toothache) UNUSUAL RASH, SWELLING OR PAIN  UNUSUAL VAGINAL DISCHARGE OR ITCHING   Items with * indicate a potential emergency and should be followed up as soon as possible or go to the Emergency Department if any problems should occur.  Please show the CHEMOTHERAPY ALERT CARD or IMMUNOTHERAPY ALERT CARD at check-in to the Emergency Department and triage nurse.  Should you have questions after your visit or need to cancel or reschedule your appointment, please contact [facility] [phone]  and follow the prompts.  Office hours are 8:00 a.m. to 4:30 p.m. Monday - Friday. Please note that voicemails left  after 4:00 p.m. may not be returned until the following business day.  We are closed weekends and major holidays. You have access to a nurse at all times for urgent questions. Please call the main number to the clinic [phone] and follow the prompts.  For any non-urgent questions, you may also contact your provider using MyChart. We now offer e-Visits for anyone 13 and older to request care online for non-urgent symptoms. For details visit [email or web address].   Also download the MyChart app! Go to the app store, search "MyChart", open the app, select [place], and log in with your MyChart username and password.

## 2023-01-20 NOTE — Progress Notes (Signed)
Patients port flushed without difficulty.  Good blood return noted with no bruising or swelling noted at site.  Stable during access and blood draw.  Patient to remain accessed for treatment. 

## 2023-01-20 NOTE — Progress Notes (Signed)
Ok to proceed with BP 163/83 for bevacizumab parameters.  V.O. Dr Carilyn Goodpasture, PharmD

## 2023-01-20 NOTE — Progress Notes (Signed)
Labs within parameters for treatment. Potassium 3.1 . Standing orders followed. Message received from Dr. Ellin Saba to proceed with treatment. Blood pressure 164/73.   Treatment given today per MD orders. Tolerated infusion without adverse affects. Vital signs stable. No complaints at this time. 5FU pump placed on patient. RUN noted on screen and verified with patient. Discharged from clinic ambulatory in stable condition. Alert and oriented x 3. F/U with [facility] as scheduled.

## 2023-01-22 ENCOUNTER — Inpatient Hospital Stay: Payer: No Typology Code available for payment source

## 2023-01-22 VITALS — BP 161/81 | HR 84 | Temp 97.8°F | Resp 18

## 2023-01-22 DIAGNOSIS — C189 Malignant neoplasm of colon, unspecified: Secondary | ICD-10-CM

## 2023-01-22 DIAGNOSIS — Z5111 Encounter for antineoplastic chemotherapy: Secondary | ICD-10-CM | POA: Diagnosis not present

## 2023-01-22 MED ORDER — HEPARIN SOD (PORK) LOCK FLUSH 100 UNIT/ML IV SOLN
500.0000 [IU] | Freq: Once | INTRAVENOUS | Status: AC | PRN
  Administered 2023-01-22: 500 [IU]

## 2023-01-22 MED ORDER — SODIUM CHLORIDE 0.9% FLUSH
10.0000 mL | INTRAVENOUS | Status: DC | PRN
  Administered 2023-01-22: 10 mL

## 2023-01-22 NOTE — Progress Notes (Signed)
Patient presents today for pump d/c. Vital signs are stable. Port a cath site clean, dry, and intact. Port flushed with 10 mls of Normal Saline and 500 Units of Heparin. Needle removed intact. Band aid applied. Patient has no complaints at this time. Discharged from clinic ambulatory and in stable condition. Patient alert and oriented.  

## 2023-01-22 NOTE — Patient Instructions (Signed)
[facility]  Discharge Instructions: Thank you for choosing [facility] to provide your oncology and hematology care.  If you have a lab appointment with the Cancer Center - please note that after April 8th, 2024, all labs will be drawn in the cancer center.  You do not have to check in or register with the main entrance as you have in the past but will complete your check-in in the cancer center.  Wear comfortable clothing and clothing appropriate for easy access to any Portacath or PICC line.   We strive to give you quality time with your provider. You may need to reschedule your appointment if you arrive late (15 or more minutes).  Arriving late affects you and other patients whose appointments are after yours.  Also, if you miss three or more appointments without notifying the office, you may be dismissed from the clinic at the provider's discretion.      For prescription refill requests, have your pharmacy contact our office and allow 72 hours for refills to be completed.    Today you received the following chemotherapy and/or immunotherapy agents Adrucil. Fluorouracil Injection What is this medication? FLUOROURACIL (flure oh YOOR a sil) treats some types of cancer. It works by slowing down the growth of cancer cells. This medicine may be used for other purposes; ask your health care provider or pharmacist if you have questions. COMMON BRAND NAME(S): Adrucil What should I tell my care team before I take this medication? They need to know if you have any of these conditions: Blood disorders Dihydropyrimidine dehydrogenase (DPD) deficiency Infection, such as chickenpox, cold sores, herpes Kidney disease Liver disease Poor nutrition Recent or ongoing radiation therapy An unusual or allergic reaction to fluorouracil, other medications, foods, dyes, or preservatives If you or your partner are pregnant or trying to get pregnant Breast-feeding How should  I use this medication? This medication is injected into a vein. It is administered by your care team in a hospital or clinic setting. Talk to your care team about the use of this medication in children. Special care may be needed. Overdosage: If you think you have taken too much of this medicine contact a poison control center or emergency room at once. NOTE: This medicine is only for you. Do not share this medicine with others. What if I miss a dose? Keep appointments for follow-up doses. It is important not to miss your dose. Call your care team if you are unable to keep an appointment. What may interact with this medication? Do not take this medication with any of the following: Live virus vaccines This medication may also interact with the following: Medications that treat or prevent blood clots, such as warfarin, enoxaparin, dalteparin This list may not describe all possible interactions. Give your health care provider a list of all the medicines, herbs, non-prescription drugs, or dietary supplements you use. Also tell them if you smoke, drink alcohol, or use illegal drugs. Some items may interact with your medicine. What should I watch for while using this medication? Your condition will be monitored carefully while you are receiving this medication. This medication may make you feel generally unwell. This is not uncommon as chemotherapy can affect healthy cells as well as cancer cells. Report any side effects. Continue your course of treatment even though you feel ill unless your care team tells you to stop. In some cases, you may be given additional medications to help with side effects. Follow all directions   for their use. This medication may increase your risk of getting an infection. Call your care team for advice if you get a fever, chills, sore throat, or other symptoms of a cold or flu. Do not treat yourself. Try to avoid being around people who are sick. This medication may increase  your risk to bruise or bleed. Call your care team if you notice any unusual bleeding. Be careful brushing or flossing your teeth or using a toothpick because you may get an infection or bleed more easily. If you have any dental work done, tell your dentist you are receiving this medication. Avoid taking medications that contain aspirin, acetaminophen, ibuprofen, naproxen, or ketoprofen unless instructed by your care team. These medications may hide a fever. Do not treat diarrhea with over the counter products. Contact your care team if you have diarrhea that lasts more than 2 days or if it is severe and watery. This medication can make you more sensitive to the sun. Keep out of the sun. If you cannot avoid being in the sun, wear protective clothing and sunscreen. Do not use sun lamps, tanning beds, or tanning booths. Talk to your care team if you or your partner wish to become pregnant or think you might be pregnant. This medication can cause serious birth defects if taken during pregnancy and for 3 months after the last dose. A reliable form of contraception is recommended while taking this medication and for 3 months after the last dose. Talk to your care team about effective forms of contraception. Do not father a child while taking this medication and for 3 months after the last dose. Use a condom while having sex during this time period. Do not breastfeed while taking this medication. This medication may cause infertility. Talk to your care team if you are concerned about your fertility. What side effects may I notice from receiving this medication? Side effects that you should report to your care team as soon as possible: Allergic reactions--skin rash, itching, hives, swelling of the face, lips, tongue, or throat Heart attack--pain or tightness in the chest, shoulders, arms, or jaw, nausea, shortness of breath, cold or clammy skin, feeling faint or lightheaded Heart failure--shortness of breath,  swelling of the ankles, feet, or hands, sudden weight gain, unusual weakness or fatigue Heart rhythm changes--fast or irregular heartbeat, dizziness, feeling faint or lightheaded, chest pain, trouble breathing High ammonia level--unusual weakness or fatigue, confusion, loss of appetite, nausea, vomiting, seizures Infection--fever, chills, cough, sore throat, wounds that don't heal, pain or trouble when passing urine, general feeling of discomfort or being unwell Low red blood cell level--unusual weakness or fatigue, dizziness, headache, trouble breathing Pain, tingling, or numbness in the hands or feet, muscle weakness, change in vision, confusion or trouble speaking, loss of balance or coordination, trouble walking, seizures Redness, swelling, and blistering of the skin over hands and feet Severe or prolonged diarrhea Unusual bruising or bleeding Side effects that usually do not require medical attention (report to your care team if they continue or are bothersome): Dry skin Headache Increased tears Nausea Pain, redness, or swelling with sores inside the mouth or throat Sensitivity to light Vomiting This list may not describe all possible side effects. Call your doctor for medical advice about side effects. You may report side effects to FDA at 1-800-FDA-1088. Where should I keep my medication? This medication is given in a hospital or clinic. It will not be stored at home. NOTE: This sheet is a summary. It may not   cover all possible information. If you have questions about this medicine, talk to your doctor, pharmacist, or health care provider.  2024 Elsevier/Gold Standard (2021-10-21 00:00:00)       To help prevent nausea and vomiting after your treatment, we encourage you to take your nausea medication as directed.  BELOW ARE SYMPTOMS THAT SHOULD BE REPORTED IMMEDIATELY: *FEVER GREATER THAN 100.4 F (38 C) OR HIGHER *CHILLS OR SWEATING *NAUSEA AND VOMITING THAT IS NOT CONTROLLED  WITH YOUR NAUSEA MEDICATION *UNUSUAL SHORTNESS OF BREATH *UNUSUAL BRUISING OR BLEEDING *URINARY PROBLEMS (pain or burning when urinating, or frequent urination) *BOWEL PROBLEMS (unusual diarrhea, constipation, pain near the anus) TENDERNESS IN MOUTH AND THROAT WITH OR WITHOUT PRESENCE OF ULCERS (sore throat, sores in mouth, or a toothache) UNUSUAL RASH, SWELLING OR PAIN  UNUSUAL VAGINAL DISCHARGE OR ITCHING   Items with * indicate a potential emergency and should be followed up as soon as possible or go to the Emergency Department if any problems should occur.  Please show the CHEMOTHERAPY ALERT CARD or IMMUNOTHERAPY ALERT CARD at check-in to the Emergency Department and triage nurse.  Should you have questions after your visit or need to cancel or reschedule your appointment, please contact [facility] [phone]  and follow the prompts.  Office hours are 8:00 a.m. to 4:30 p.m. Monday - Friday. Please note that voicemails left after 4:00 p.m. may not be returned until the following business day.  We are closed weekends and major holidays. You have access to a nurse at all times for urgent questions. Please call the main number to the clinic [phone] and follow the prompts.  For any non-urgent questions, you may also contact your provider using MyChart. We now offer e-Visits for anyone 18 and older to request care online for non-urgent symptoms. For details visit [email or web address].   Also download the MyChart app! Go to the app store, search "MyChart", open the app, select [facility], and log in with your MyChart username and password.   

## 2023-01-27 ENCOUNTER — Other Ambulatory Visit: Payer: Self-pay | Admitting: Hematology

## 2023-01-27 DIAGNOSIS — F419 Anxiety disorder, unspecified: Secondary | ICD-10-CM

## 2023-02-03 ENCOUNTER — Inpatient Hospital Stay: Payer: No Typology Code available for payment source

## 2023-02-03 ENCOUNTER — Inpatient Hospital Stay: Payer: No Typology Code available for payment source | Attending: Hematology

## 2023-02-03 VITALS — BP 151/79 | HR 85 | Temp 97.4°F | Resp 18 | Wt 188.2 lb

## 2023-02-03 DIAGNOSIS — Z87891 Personal history of nicotine dependence: Secondary | ICD-10-CM | POA: Insufficient documentation

## 2023-02-03 DIAGNOSIS — C787 Secondary malignant neoplasm of liver and intrahepatic bile duct: Secondary | ICD-10-CM | POA: Insufficient documentation

## 2023-02-03 DIAGNOSIS — Z5111 Encounter for antineoplastic chemotherapy: Secondary | ICD-10-CM | POA: Insufficient documentation

## 2023-02-03 DIAGNOSIS — C189 Malignant neoplasm of colon, unspecified: Secondary | ICD-10-CM | POA: Diagnosis not present

## 2023-02-03 DIAGNOSIS — E876 Hypokalemia: Secondary | ICD-10-CM | POA: Insufficient documentation

## 2023-02-03 DIAGNOSIS — F419 Anxiety disorder, unspecified: Secondary | ICD-10-CM | POA: Insufficient documentation

## 2023-02-03 DIAGNOSIS — R7401 Elevation of levels of liver transaminase levels: Secondary | ICD-10-CM | POA: Diagnosis not present

## 2023-02-03 DIAGNOSIS — I10 Essential (primary) hypertension: Secondary | ICD-10-CM | POA: Diagnosis not present

## 2023-02-03 DIAGNOSIS — Z79899 Other long term (current) drug therapy: Secondary | ICD-10-CM | POA: Diagnosis not present

## 2023-02-03 DIAGNOSIS — D509 Iron deficiency anemia, unspecified: Secondary | ICD-10-CM | POA: Insufficient documentation

## 2023-02-03 DIAGNOSIS — G629 Polyneuropathy, unspecified: Secondary | ICD-10-CM | POA: Diagnosis not present

## 2023-02-03 DIAGNOSIS — Z801 Family history of malignant neoplasm of trachea, bronchus and lung: Secondary | ICD-10-CM | POA: Diagnosis not present

## 2023-02-03 LAB — CBC WITH DIFFERENTIAL/PLATELET
Abs Immature Granulocytes: 0.01 10*3/uL (ref 0.00–0.07)
Basophils Absolute: 0 10*3/uL (ref 0.0–0.1)
Basophils Relative: 0 %
Eosinophils Absolute: 0.1 10*3/uL (ref 0.0–0.5)
Eosinophils Relative: 2 %
HCT: 42.1 % (ref 36.0–46.0)
Hemoglobin: 14 g/dL (ref 12.0–15.0)
Immature Granulocytes: 0 %
Lymphocytes Relative: 18 %
Lymphs Abs: 1.3 10*3/uL (ref 0.7–4.0)
MCH: 30.8 pg (ref 26.0–34.0)
MCHC: 33.3 g/dL (ref 30.0–36.0)
MCV: 92.5 fL (ref 80.0–100.0)
Monocytes Absolute: 0.7 10*3/uL (ref 0.1–1.0)
Monocytes Relative: 9 %
Neutro Abs: 5 10*3/uL (ref 1.7–7.7)
Neutrophils Relative %: 71 %
Platelets: 112 10*3/uL — ABNORMAL LOW (ref 150–400)
RBC: 4.55 MIL/uL (ref 3.87–5.11)
RDW: 16.3 % — ABNORMAL HIGH (ref 11.5–15.5)
WBC: 7.1 10*3/uL (ref 4.0–10.5)
nRBC: 0 % (ref 0.0–0.2)

## 2023-02-03 LAB — URINALYSIS, DIPSTICK ONLY
Bilirubin Urine: NEGATIVE
Glucose, UA: NEGATIVE mg/dL
Hgb urine dipstick: NEGATIVE
Ketones, ur: NEGATIVE mg/dL
Leukocytes,Ua: NEGATIVE
Nitrite: NEGATIVE
Protein, ur: 100 mg/dL — AB
Specific Gravity, Urine: 1.015 (ref 1.005–1.030)
pH: 6 (ref 5.0–8.0)

## 2023-02-03 LAB — MAGNESIUM: Magnesium: 1.9 mg/dL (ref 1.7–2.4)

## 2023-02-03 LAB — COMPREHENSIVE METABOLIC PANEL
ALT: 38 U/L (ref 0–44)
AST: 43 U/L — ABNORMAL HIGH (ref 15–41)
Albumin: 3.4 g/dL — ABNORMAL LOW (ref 3.5–5.0)
Alkaline Phosphatase: 143 U/L — ABNORMAL HIGH (ref 38–126)
Anion gap: 10 (ref 5–15)
BUN: 13 mg/dL (ref 8–23)
CO2: 26 mmol/L (ref 22–32)
Calcium: 8.7 mg/dL — ABNORMAL LOW (ref 8.9–10.3)
Chloride: 94 mmol/L — ABNORMAL LOW (ref 98–111)
Creatinine, Ser: 0.9 mg/dL (ref 0.44–1.00)
GFR, Estimated: >60 mL/min (ref >60)
Glucose, Bld: 157 mg/dL — ABNORMAL HIGH (ref 70–99)
Potassium: 3.2 mmol/L — ABNORMAL LOW (ref 3.5–5.1)
Sodium: 130 mmol/L — ABNORMAL LOW (ref 135–145)
Total Bilirubin: 1.4 mg/dL — ABNORMAL HIGH (ref 0.3–1.2)
Total Protein: 7.3 g/dL (ref 6.5–8.1)

## 2023-02-03 MED ORDER — SODIUM CHLORIDE 0.9% FLUSH
10.0000 mL | Freq: Once | INTRAVENOUS | Status: AC
  Administered 2023-02-03: 10 mL via INTRAVENOUS

## 2023-02-03 MED ORDER — PALONOSETRON HCL INJECTION 0.25 MG/5ML
0.2500 mg | Freq: Once | INTRAVENOUS | Status: AC
  Administered 2023-02-03: 0.25 mg via INTRAVENOUS
  Filled 2023-02-03: qty 5

## 2023-02-03 MED ORDER — SODIUM CHLORIDE 0.9 % IV SOLN: Freq: Once | INTRAVENOUS | Status: AC

## 2023-02-03 MED ORDER — SODIUM CHLORIDE 0.9 % IV SOLN
10.0000 mg | Freq: Once | INTRAVENOUS | Status: AC
  Administered 2023-02-03: 10 mg via INTRAVENOUS
  Filled 2023-02-03: qty 10

## 2023-02-03 MED ORDER — SODIUM CHLORIDE 0.9 % IV SOLN
5.0000 mg/kg | Freq: Once | INTRAVENOUS | Status: AC
  Administered 2023-02-03: 400 mg via INTRAVENOUS
  Filled 2023-02-03: qty 16

## 2023-02-03 MED ORDER — SODIUM CHLORIDE 0.9 % IV SOLN
3250.0000 mg | INTRAVENOUS | Status: DC
  Administered 2023-02-03: 3250 mg via INTRAVENOUS
  Filled 2023-02-03: qty 65

## 2023-02-03 MED ORDER — SODIUM CHLORIDE 0.9 % IV SOLN
320.0000 mg/m2 | Freq: Once | INTRAVENOUS | Status: AC
  Administered 2023-02-03: 628 mg via INTRAVENOUS
  Filled 2023-02-03: qty 31.4

## 2023-02-03 MED ORDER — POTASSIUM CHLORIDE CRYS ER 20 MEQ PO TBCR
40.0000 meq | EXTENDED_RELEASE_TABLET | Freq: Once | ORAL | Status: AC
  Administered 2023-02-03: 40 meq via ORAL
  Filled 2023-02-03: qty 2

## 2023-02-03 MED ORDER — FLUOROURACIL CHEMO INJECTION 500 MG/10ML
270.0000 mg/m2 | Freq: Once | INTRAVENOUS | Status: AC
  Administered 2023-02-03: 500 mg via INTRAVENOUS
  Filled 2023-02-03: qty 10

## 2023-02-03 NOTE — Progress Notes (Signed)
Ok to proceed with UP 100   Pryor Ochoa, PharmD

## 2023-02-03 NOTE — Progress Notes (Signed)
Patient presents today for treatment. MVASI, Leucovorin and 5FU pump. Vital signs and labs within parameters for treatment. Potassium 3.2. Standing orders followed. Patient will receive 40 mEq of potassium po.   Urine protein 100. Message sent to Dr. Ellin Saba pertaining to treatment.   Message received from Westley Hummer RN / Dr.Katragadda to proceed with treatment .   Treatment given today per MD orders. Tolerated infusion without adverse affects. Vital signs stable. RUN noted on 5FU pump and verified with patient. No complaints at this time. Discharged from clinic ambulatory in stable condition. Alert and oriented x 3. F/U with [facility] as scheduled.

## 2023-02-03 NOTE — Patient Instructions (Signed)
[facility]  Discharge Instructions: Thank you for choosing [facility] to provide your oncology and hematology care.  If you have a lab appointment with the Cancer Center - please note that after April 8th, 2024, all labs will be drawn in the cancer center.  You do not have to check in or register with the main entrance as you have in the past but will complete your check-in in the cancer center.  Wear comfortable clothing and clothing appropriate for easy access to any Portacath or PICC line.   We strive to give you quality time with your provider. You may need to reschedule your appointment if you arrive late (15 or more minutes).  Arriving late affects you and other patients whose appointments are after yours.  Also, if you miss three or more appointments without notifying the office, you may be dismissed from the clinic at the provider's discretion.      For prescription refill requests, have your pharmacy contact our office and allow 72 hours for refills to be completed.    Today you received the following chemotherapy and/or immunotherapy agents Adrucil. Fluorouracil Injection What is this medication? FLUOROURACIL (flure oh YOOR a sil) treats some types of cancer. It works by slowing down the growth of cancer cells. This medicine may be used for other purposes; ask your health care provider or pharmacist if you have questions. COMMON BRAND NAME(S): Adrucil What should I tell my care team before I take this medication? They need to know if you have any of these conditions: Blood disorders Dihydropyrimidine dehydrogenase (DPD) deficiency Infection, such as chickenpox, cold sores, herpes Kidney disease Liver disease Poor nutrition Recent or ongoing radiation therapy An unusual or allergic reaction to fluorouracil, other medications, foods, dyes, or preservatives If you or your partner are pregnant or trying to get pregnant Breast-feeding How should  I use this medication? This medication is injected into a vein. It is administered by your care team in a hospital or clinic setting. Talk to your care team about the use of this medication in children. Special care may be needed. Overdosage: If you think you have taken too much of this medicine contact a poison control center or emergency room at once. NOTE: This medicine is only for you. Do not share this medicine with others. What if I miss a dose? Keep appointments for follow-up doses. It is important not to miss your dose. Call your care team if you are unable to keep an appointment. What may interact with this medication? Do not take this medication with any of the following: Live virus vaccines This medication may also interact with the following: Medications that treat or prevent blood clots, such as warfarin, enoxaparin, dalteparin This list may not describe all possible interactions. Give your health care provider a list of all the medicines, herbs, non-prescription drugs, or dietary supplements you use. Also tell them if you smoke, drink alcohol, or use illegal drugs. Some items may interact with your medicine. What should I watch for while using this medication? Your condition will be monitored carefully while you are receiving this medication. This medication may make you feel generally unwell. This is not uncommon as chemotherapy can affect healthy cells as well as cancer cells. Report any side effects. Continue your course of treatment even though you feel ill unless your care team tells you to stop. In some cases, you may be given additional medications to help with side effects. Follow all directions   for their use. This medication may increase your risk of getting an infection. Call your care team for advice if you get a fever, chills, sore throat, or other symptoms of a cold or flu. Do not treat yourself. Try to avoid being around people who are sick. This medication may increase  your risk to bruise or bleed. Call your care team if you notice any unusual bleeding. Be careful brushing or flossing your teeth or using a toothpick because you may get an infection or bleed more easily. If you have any dental work done, tell your dentist you are receiving this medication. Avoid taking medications that contain aspirin, acetaminophen, ibuprofen, naproxen, or ketoprofen unless instructed by your care team. These medications may hide a fever. Do not treat diarrhea with over the counter products. Contact your care team if you have diarrhea that lasts more than 2 days or if it is severe and watery. This medication can make you more sensitive to the sun. Keep out of the sun. If you cannot avoid being in the sun, wear protective clothing and sunscreen. Do not use sun lamps, tanning beds, or tanning booths. Talk to your care team if you or your partner wish to become pregnant or think you might be pregnant. This medication can cause serious birth defects if taken during pregnancy and for 3 months after the last dose. A reliable form of contraception is recommended while taking this medication and for 3 months after the last dose. Talk to your care team about effective forms of contraception. Do not father a child while taking this medication and for 3 months after the last dose. Use a condom while having sex during this time period. Do not breastfeed while taking this medication. This medication may cause infertility. Talk to your care team if you are concerned about your fertility. What side effects may I notice from receiving this medication? Side effects that you should report to your care team as soon as possible: Allergic reactions--skin rash, itching, hives, swelling of the face, lips, tongue, or throat Heart attack--pain or tightness in the chest, shoulders, arms, or jaw, nausea, shortness of breath, cold or clammy skin, feeling faint or lightheaded Heart failure--shortness of breath,  swelling of the ankles, feet, or hands, sudden weight gain, unusual weakness or fatigue Heart rhythm changes--fast or irregular heartbeat, dizziness, feeling faint or lightheaded, chest pain, trouble breathing High ammonia level--unusual weakness or fatigue, confusion, loss of appetite, nausea, vomiting, seizures Infection--fever, chills, cough, sore throat, wounds that don't heal, pain or trouble when passing urine, general feeling of discomfort or being unwell Low red blood cell level--unusual weakness or fatigue, dizziness, headache, trouble breathing Pain, tingling, or numbness in the hands or feet, muscle weakness, change in vision, confusion or trouble speaking, loss of balance or coordination, trouble walking, seizures Redness, swelling, and blistering of the skin over hands and feet Severe or prolonged diarrhea Unusual bruising or bleeding Side effects that usually do not require medical attention (report to your care team if they continue or are bothersome): Dry skin Headache Increased tears Nausea Pain, redness, or swelling with sores inside the mouth or throat Sensitivity to light Vomiting This list may not describe all possible side effects. Call your doctor for medical advice about side effects. You may report side effects to FDA at 1-800-FDA-1088. Where should I keep my medication? This medication is given in a hospital or clinic. It will not be stored at home. NOTE: This sheet is a summary. It may not   cover all possible information. If you have questions about this medicine, talk to your doctor, pharmacist, or health care provider.  2024 Elsevier/Gold Standard (2021-10-21 00:00:00)       To help prevent nausea and vomiting after your treatment, we encourage you to take your nausea medication as directed.  BELOW ARE SYMPTOMS THAT SHOULD BE REPORTED IMMEDIATELY: *FEVER GREATER THAN 100.4 F (38 C) OR HIGHER *CHILLS OR SWEATING *NAUSEA AND VOMITING THAT IS NOT CONTROLLED  WITH YOUR NAUSEA MEDICATION *UNUSUAL SHORTNESS OF BREATH *UNUSUAL BRUISING OR BLEEDING *URINARY PROBLEMS (pain or burning when urinating, or frequent urination) *BOWEL PROBLEMS (unusual diarrhea, constipation, pain near the anus) TENDERNESS IN MOUTH AND THROAT WITH OR WITHOUT PRESENCE OF ULCERS (sore throat, sores in mouth, or a toothache) UNUSUAL RASH, SWELLING OR PAIN  UNUSUAL VAGINAL DISCHARGE OR ITCHING   Items with * indicate a potential emergency and should be followed up as soon as possible or go to the Emergency Department if any problems should occur.  Please show the CHEMOTHERAPY ALERT CARD or IMMUNOTHERAPY ALERT CARD at check-in to the Emergency Department and triage nurse.  Should you have questions after your visit or need to cancel or reschedule your appointment, please contact [facility] [phone]  and follow the prompts.  Office hours are 8:00 a.m. to 4:30 p.m. Monday - Friday. Please note that voicemails left after 4:00 p.m. may not be returned until the following business day.  We are closed weekends and major holidays. You have access to a nurse at all times for urgent questions. Please call the main number to the clinic [phone] and follow the prompts.  For any non-urgent questions, you may also contact your provider using MyChart. We now offer e-Visits for anyone 18 and older to request care online for non-urgent symptoms. For details visit [email or web address].   Also download the MyChart app! Go to the app store, search "MyChart", open the app, select [facility], and log in with your MyChart username and password.   

## 2023-02-05 ENCOUNTER — Inpatient Hospital Stay: Payer: No Typology Code available for payment source

## 2023-02-05 VITALS — BP 159/83 | HR 72 | Temp 97.4°F | Resp 18

## 2023-02-05 DIAGNOSIS — Z5111 Encounter for antineoplastic chemotherapy: Secondary | ICD-10-CM | POA: Diagnosis not present

## 2023-02-05 DIAGNOSIS — C189 Malignant neoplasm of colon, unspecified: Secondary | ICD-10-CM

## 2023-02-05 MED ORDER — SODIUM CHLORIDE 0.9% FLUSH
10.0000 mL | INTRAVENOUS | Status: DC | PRN
  Administered 2023-02-05: 10 mL

## 2023-02-05 MED ORDER — HEPARIN SOD (PORK) LOCK FLUSH 100 UNIT/ML IV SOLN
500.0000 [IU] | Freq: Once | INTRAVENOUS | Status: AC | PRN
  Administered 2023-02-05: 500 [IU]

## 2023-02-05 NOTE — Patient Instructions (Signed)
[facility]  Discharge Instructions: Thank you for choosing [facility] to provide your oncology and hematology care.  If you have a lab appointment with the Cancer Center - please note that after April 8th, 2024, all labs will be drawn in the cancer center.  You do not have to check in or register with the main entrance as you have in the past but will complete your check-in in the cancer center.  Wear comfortable clothing and clothing appropriate for easy access to any Portacath or PICC line.   We strive to give you quality time with your provider. You may need to reschedule your appointment if you arrive late (15 or more minutes).  Arriving late affects you and other patients whose appointments are after yours.  Also, if you miss three or more appointments without notifying the office, you may be dismissed from the clinic at the provider's discretion.      For prescription refill requests, have your pharmacy contact our office and allow 72 hours for refills to be completed.    Today you had your ambulatory chemotherapy pump disconnected.    To help prevent nausea and vomiting after your treatment, we encourage you to take your nausea medication as directed.  BELOW ARE SYMPTOMS THAT SHOULD BE REPORTED IMMEDIATELY: *FEVER GREATER THAN 100.4 F (38 C) OR HIGHER *CHILLS OR SWEATING *NAUSEA AND VOMITING THAT IS NOT CONTROLLED WITH YOUR NAUSEA MEDICATION *UNUSUAL SHORTNESS OF BREATH *UNUSUAL BRUISING OR BLEEDING *URINARY PROBLEMS (pain or burning when urinating, or frequent urination) *BOWEL PROBLEMS (unusual diarrhea, constipation, pain near the anus) TENDERNESS IN MOUTH AND THROAT WITH OR WITHOUT PRESENCE OF ULCERS (sore throat, sores in mouth, or a toothache) UNUSUAL RASH, SWELLING OR PAIN  UNUSUAL VAGINAL DISCHARGE OR ITCHING   Items with * indicate a potential emergency and should be followed up as soon as possible or go to the Emergency Department if  any problems should occur.  Please show the CHEMOTHERAPY ALERT CARD or IMMUNOTHERAPY ALERT CARD at check-in to the Emergency Department and triage nurse.  Should you have questions after your visit or need to cancel or reschedule your appointment, please contact [facility] [phone]  and follow the prompts.  Office hours are 8:00 a.m. to 4:30 p.m. Monday - Friday. Please note that voicemails left after 4:00 p.m. may not be returned until the following business day.  We are closed weekends and major holidays. You have access to a nurse at all times for urgent questions. Please call the main number to the clinic [phone] and follow the prompts.  For any non-urgent questions, you may also contact your provider using MyChart. We now offer e-Visits for anyone 62 and older to request care online for non-urgent symptoms. For details visit [email or web address].   Also download the MyChart app! Go to the app store, search "MyChart", open the app, select [facility], and log in with your MyChart username and password.

## 2023-02-05 NOTE — Progress Notes (Signed)
Susan Martin presents to have home infusion pump d/c'd and for port-a-cath deaccess with flush.  Portacath located right chest wall accessed with  H 20 needle.  Good blood return present. Portacath flushed with NS and 500U/5ml Heparin, and needle removed intact.  Procedure tolerated well and without incident.  

## 2023-02-16 NOTE — Progress Notes (Signed)
[facility] [address] [place] [address]    Clinic Day:  02/17/2023  Referring physician: [name], MD  Patient Care Team: Patient, No Pcp Per as PCP - General (General Practice) Therese Sarah, RN as Oncology Nurse Navigator (Oncology)   ASSESSMENT & PLAN:   Assessment: 1.  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 -CT chest on 05/02/2020 with no evidence of pulmonary metastatic disease. -CTAP on 05/07/2020 with multiple lesions throughout the liver, largest in the right lobe measuring 3.9 cm, lateral segment left lobe measuring 2.6 cm and inferior right lobe confluent lesion 4.2 x 3.7 cm.  No lymphadenopathy. -CEA on 05/02/2020-60.2. -FOLFOX and bevacizumab started on 06/04/2020. 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   2.  Iron deficiency anemia: -Feraheme on 04/30/2020 and 05/06/2020.   3.  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 -Smoked for 3 to 4 years and quit. -Mother had cancer, type unknown.  Maternal uncle had lung cancer.  Maternal aunt had lung cancer.    Plan: 1.  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 - Her CEA has gone up to 11.8 from 7.4.  Previously it was normal. - Labs today: Mildly elevated AST and total bilirubin 1.6.  CBC grossly normal with mild thrombocytopenia. - Proceed with treatment today.  Recommend CT CAP prior to next visit in 2 weeks.   2.  Hypertension: - Continue Norvasc 10 mg daily and triamterene/HCTZ daily.   3.  Severe hypokalemia: - She is taking potassium 20 mEq daily.  Will increase potassium to 40 mEq daily.   4.   Peripheral neuropathy: - Continue gabapentin 100 mg 3 times daily.   5.  Anxiety: - Continue Klonopin 1 mg twice daily.  Continue Paxil 40 mg daily.  6.  Mucositis: - Continue lidocaine gel as needed.    Orders Placed This Encounter  Procedures   CT CHEST ABDOMEN PELVIS W CONTRAST    Standing Status:   Future    Standing Expiration Date:   02/17/2024    Order Specific Question:   If indicated for the ordered procedure, I authorize the administration of contrast media per Radiology protocol    Answer:   Yes    Order Specific Question:   Does the patient have a contrast media/X-ray dye allergy?    Answer:   No    Order Specific Question:   Preferred imaging location?    Answer:   [facility]    Order Specific Question:   If indicated for the ordered procedure, I authorize the administration of oral contrast media per Radiology protocol    Answer:   Yes      I,Katie Daubenspeck,acting as a scribe for [name], MD.,have documented all relevant documentation on the behalf of [name], MD,as directed by  [name], MD while in the presence of [name], MD.   I, [name] MD, have reviewed the above documentation for accuracy and completeness, and I agree with the above.   [name], MD   8/21/20246:22 PM  CHIEF COMPLAINT:   Diagnosis: metastatic colon cancer to liver    Cancer Staging  No matching staging information was found for the patient.  Prior Therapy: 1. Sigmoid colectomy and end colostomy on 04/29/2020  2. FOLFOX with bevacizumab, 06/04/20 - 11/06/20  Current Therapy:  Maintenance 5-FU and bevacizumab    HISTORY OF PRESENT ILLNESS:   Oncology History  Metastatic colon cancer to liver (HCC)  05/16/2020 Initial Diagnosis   Metastatic colon cancer to liver (HCC)   06/03/2020 Genetic Testing   Foundation One:     06/04/2020 - 02/25/2022 Chemotherapy   Patient is on Treatment Plan :  COLORECTAL FOLFOX + Bevacizumab q14d     06/04/2020 -  Chemotherapy   Patient is on Treatment Plan : COLORECTAL FOLFOX + Bevacizumab q14d        INTERVAL HISTORY:   Susan Martin is a 62 y.o. female presenting to clinic today for follow up of metastatic colon cancer to liver. She was last seen by me on 01/06/23.  Today, she states that she is doing well overall. Her appetite level is at 100%. Her energy level is at 50%.  PAST MEDICAL HISTORY:   Past Medical History: Past Medical History:  Diagnosis Date   Adenocarcinoma of colon metastatic to liver [facility]) onocology--- dr s. Ellin Saba   04-29-2020 emergerency surgery for perforated colon s/p sigmoid colectomy w/ colostomy; dx  Stage IV colon cancer mets to liver   Anxiety    Depression    Hypertension    followed by dr Emelda Fear and oncology  (05-27-2020 per pt does not have pcp yet)   IDA (iron deficiency anemia)    Pulmonary nodule, right     Surgical History: Past Surgical History:  Procedure Laterality Date   ABDOMINAL HYSTERECTOMY  03-31-2016   @[facility]    W/  BILATERAL SALPINOOPHORECTOMY    APPLICATION OF WOUND VAC N/A 04/29/2020   Procedure: APPLICATION OF WOUND VAC;  Surgeon: Rodman Pickle, MD;  Location: [facility] OR;  Service: General;  Laterality: N/A;   ENDOMETRIAL ABLATION     LAPAROTOMY N/A 04/29/2020   Procedure: EXPLORATORY LAPAROTOMY;  Surgeon: Rodman Pickle, MD;  Location: [facility] OR;  Service: General;  Laterality: N/A;   PARTIAL COLECTOMY N/A 04/29/2020   Procedure: PARTIAL COLECTOMY WITH END COLOSTOMY;  Surgeon: Sheliah Hatch De Blanch, MD;  Location: [facility] OR;  Service: General;  Laterality: N/A;   PORTACATH PLACEMENT Right 05/28/2020   Procedure: INSERTION PORT-A-CATH;  Surgeon: Kinsinger, De Blanch, MD;  Location: [facility];  Service: General;  Laterality: Right;   SALPINGOOPHORECTOMY Left 03/31/2016   Procedure: LEFT SALPINGO OOPHORECTOMY WITH FROZEN SECTION,  ABDOMINAL HYSTERECTOMY WITH BILATERAL  SALPINGO-OOPHORECTOMY;  Surgeon: Tilda Burrow, MD;  Location: [facility];  Service: Gynecology;  Laterality: Left;    Social History: Social History   Socioeconomic History   Marital status: Divorced    Spouse name: Not on file   Number of children: Not on file   Years of education: Not on file   Highest education level: Not on file  Occupational History   Not on file  Tobacco Use   Smoking status: Former    Current packs/day: 0.00    Average packs/day: 0.5 packs/day for 5.0 years (2.5 ttl pk-yrs)    Types: Cigarettes    Start date: 03/27/2010    Quit date: 03/28/2015    Years since quitting: 7.8   Smokeless tobacco: Never  Vaping Use   Vaping status: Never Used  Substance and Sexual Activity   Alcohol use: No   Drug use: Never   Sexual activity: Yes    Partners: Male    Birth control/protection: Surgical  Other Topics Concern   Not on file  Social History Narrative   Not on file   Social Determinants of Health   Financial Resource Strain: Low Risk  (11/01/2019)   Overall Financial Resource Strain (CARDIA)    Difficulty of Paying Living Expenses: Not very hard  Food Insecurity: No Food Insecurity (11/01/2019)   Hunger Vital Sign    Worried About Running Out of Food in the Last Year: Never true    Ran Out of Food in the Last Year: Never true  Transportation Needs: No Transportation Needs (11/01/2019)   PRAPARE - Administrator, Civil Service (Medical): No    Lack of Transportation (Non-Medical): No  Physical Activity: Insufficiently Active (11/01/2019)   Exercise Vital Sign    Days of Exercise per Week: 2 days    Minutes of Exercise per Session: 30 min  Stress: Stress Concern Present (11/01/2019)   [employer] of Occupational Health - Occupational Stress Questionnaire    Feeling of Stress : To some extent  Social Connections: Moderately Integrated (11/01/2019)   Social Connection and Isolation Panel [NHANES]    Frequency of Communication with Friends and  Family: More than three times a week    Frequency of Social Gatherings with Friends and Family: Once a week    Attends Religious Services: More than 4 times per year    Active Member of [employer] or Organizations: No    Attends Banker Meetings: Never    Marital Status: Living with partner  Intimate Partner Violence: Not At Risk (11/01/2019)   Humiliation, Afraid, Rape, and Kick questionnaire    Fear of Current or Ex-Partner: No    Emotionally Abused: No    Physically Abused: No    Sexually Abused: No    Family History: Family History  Problem Relation Age of Onset   Hypertension Mother    Diabetes Mother    Cirrhosis Father     Current Medications:  Current Outpatient Medications:    amLODipine (NORVASC) 10 MG tablet, Take 1 tablet (10 mg total) by mouth daily., Disp: 30 tablet, Rfl: 6   BEVACIZUMAB IV, Inject 5 mg/kg into the vein every 14 (fourteen) days., Disp: , Rfl:    clonazePAM (KLONOPIN) 1 MG tablet, TAKE 1 TABLET (1 MG TOTAL) BY MOUTH IN THE MORNING, AT NOON, AND AT BEDTIME., Disp: 90 tablet, Rfl: 3   fluorouracil CALGB 86578 in sodium chloride 0.9 % 150 mL, Inject 2,400 mg/m2 into the vein over 48 hr., Disp: , Rfl:    gabapentin (NEURONTIN) 100 MG capsule, TAKE 1 CAPSULE (100 MG TOTAL) BY MOUTH THREE TIMES DAILY., Disp: 90 capsule, Rfl: 3   LEUCOVORIN CALCIUM IV, Inject 400 mg/m2 into the vein every 21 ( twenty-one) days., Disp: , Rfl:    lidocaine (XYLOCAINE) 2 % solution, Use as directed 15 mLs in the mouth or throat every 6 (six) hours as needed for mouth pain. Swish and spit/swallow every six hours as needed for mouth pain, Disp: 480 mL, Rfl: 3   lidocaine-prilocaine (EMLA) cream, Apply 1 Application topically as needed., Disp: 30 g, Rfl: 0   nystatin (MYCOSTATIN) 100000 UNIT/ML suspension, Take by mouth., Disp: , Rfl:    oxyCODONE (OXY IR/ROXICODONE) 5 MG immediate release tablet, Take 5 mg by mouth every 6 (six) hours as needed. for pain, Disp: , Rfl:     PARoxetine (PAXIL) 40 MG tablet, Take 1 tablet (40 mg total) by mouth every morning., Disp: 90 tablet, Rfl: 3  polyethylene glycol (MIRALAX / GLYCOLAX) packet, Take 17 g by mouth every other day., Disp: , Rfl:    potassium chloride (KLOR-CON M10) 10 MEQ tablet, Take 2 tablets (20 mEq total) by mouth daily., Disp: 180 tablet, Rfl: 3   triamterene-hydrochlorothiazide (MAXZIDE-25) 37.5-25 MG tablet, TAKE 1 TABLET BY MOUTH EVERY DAY, Disp: 90 tablet, Rfl: 1 No current facility-administered medications for this visit.  Facility-Administered Medications Ordered in Other Visits:    fluorouracil (ADRUCIL) 3,250 mg in sodium chloride 0.9 % 85 mL chemo infusion, 3,250 mg, Intravenous, 1 day or 1 dose, [name], MD, Infusion Verify at 02/17/23 1157   heparin lock flush 100 unit/mL, 500 Units, Intracatheter, Once PRN, [name], MD   sodium chloride flush (NS) 0.9 % injection 10 mL, 10 mL, Intracatheter, PRN, [name], MD   Allergies: No Known Allergies  REVIEW OF SYSTEMS:   Review of Systems  Constitutional:  Negative for chills, fatigue and fever.  HENT:   Negative for lump/mass, mouth sores, nosebleeds, sore throat and trouble swallowing.   Eyes:  Negative for eye problems.  Respiratory:  Negative for cough and shortness of breath.   Cardiovascular:  Negative for chest pain, leg swelling and palpitations.  Gastrointestinal:  Positive for diarrhea. Negative for abdominal pain, constipation, nausea and vomiting.  Genitourinary:  Negative for bladder incontinence, difficulty urinating, dysuria, frequency, hematuria and nocturia.   Musculoskeletal:  Negative for arthralgias, back pain, flank pain, myalgias and neck pain.  Skin:  Negative for itching and rash.  Neurological:  Positive for numbness. Negative for dizziness and headaches.  Hematological:  Does not bruise/bleed easily.  Psychiatric/Behavioral:  Positive for depression. Negative for sleep disturbance and  suicidal ideas. The patient is nervous/anxious.   All other systems reviewed and are negative.    VITALS:   Blood pressure (!) 167/84.  Wt Readings from Last 3 Encounters:  02/17/23 185 lb 6.4 oz (84.1 kg)  02/03/23 188 lb 3.2 oz (85.4 kg)  01/20/23 186 lb 9.6 oz (84.6 kg)    There is no height or weight on file to calculate BMI.  Performance status (ECOG): 1 - Symptomatic but completely ambulatory  PHYSICAL EXAM:   Physical Exam Vitals and nursing note reviewed. Exam conducted with a chaperone present.  Constitutional:      Appearance: Normal appearance.  Cardiovascular:     Rate and Rhythm: Normal rate and regular rhythm.     Pulses: Normal pulses.     Heart sounds: Normal heart sounds.  Pulmonary:     Effort: Pulmonary effort is normal.     Breath sounds: Normal breath sounds.  Abdominal:     Palpations: Abdomen is soft. There is no hepatomegaly, splenomegaly or mass.     Tenderness: There is no abdominal tenderness.  Musculoskeletal:     Right lower leg: No edema.     Left lower leg: No edema.  Lymphadenopathy:     Cervical: No cervical adenopathy.     Right cervical: No superficial, deep or posterior cervical adenopathy.    Left cervical: No superficial, deep or posterior cervical adenopathy.     Upper Body:     Right upper body: No supraclavicular or axillary adenopathy.     Left upper body: No supraclavicular or axillary adenopathy.  Neurological:     General: No focal deficit present.     Mental Status: She is alert and oriented to person, place, and time.  Psychiatric:        Mood and Affect: Mood normal.  Behavior: Behavior normal.     LABS:      Latest Ref Rng & Units 02/17/2023    8:18 AM 02/03/2023    8:28 AM 01/20/2023    8:35 AM  CBC  WBC 4.0 - 10.5 K/uL 7.2  7.1  6.0   Hemoglobin 12.0 - 15.0 g/dL 32.4  40.1  02.7   Hematocrit 36.0 - 46.0 % 42.5  42.1  41.5   Platelets 150 - 400 K/uL 130  112  122       Latest Ref Rng & Units 02/17/2023     8:18 AM 02/03/2023    8:28 AM 01/20/2023    8:35 AM  CMP  Glucose 70 - 99 mg/dL 253  664  403   BUN 8 - 23 mg/dL 9  13  14    Creatinine 0.44 - 1.00 mg/dL 4.74  2.59  5.63   Sodium 135 - 145 mmol/L 132  130  132   Potassium 3.5 - 5.1 mmol/L 3.1  3.2  3.1   Chloride 98 - 111 mmol/L 95  94  98   CO2 22 - 32 mmol/L 25  26  25    Calcium 8.9 - 10.3 mg/dL 8.7  8.7  8.5   Total Protein 6.5 - 8.1 g/dL 7.3  7.3  7.3   Total Bilirubin 0.3 - 1.2 mg/dL 1.6  1.4  1.3   Alkaline Phos 38 - 126 U/L 143  143  133   AST 15 - 41 U/L 49  43  47   ALT 0 - 44 U/L 41  38  38      Lab Results  Component Value Date   CEA1 11.8 (H) 01/06/2023   /  CEA  Date Value Ref Range Status  01/06/2023 11.8 (H) 0.0 - 4.7 ng/mL Final    Comment:    (NOTE)                             Nonsmokers          <3.9                             Smokers             <5.6 Roche [employer]ostics Electrochemiluminescence Immunoassay (ECLIA) Values obtained with different assay methods or kits cannot be used interchangeably.  Results cannot be interpreted as absolute evidence of the presence or absence of malignant disease. Performed At: [facility] [address] [place], [place] [id number] Jolene Schimke MD [id number]    No results found for: "PSA1" No results found for: "CAN199" No results found for: "CAN125"  No results found for: "TOTALPROTELP", "ALBUMINELP", "A1GS", "A2GS", "BETS", "BETA2SER", "GAMS", "MSPIKE", "SPEI" Lab Results  Component Value Date   TIBC 248 (L) 04/29/2020   FERRITIN 57 04/29/2020   IRONPCTSAT 3 (L) 04/29/2020   No results found for: "LDH"   STUDIES:   No results found.

## 2023-02-17 ENCOUNTER — Inpatient Hospital Stay (HOSPITAL_BASED_OUTPATIENT_CLINIC_OR_DEPARTMENT_OTHER): Payer: No Typology Code available for payment source | Admitting: Hematology

## 2023-02-17 ENCOUNTER — Inpatient Hospital Stay: Payer: No Typology Code available for payment source

## 2023-02-17 VITALS — BP 162/81 | HR 90 | Temp 97.6°F | Resp 18

## 2023-02-17 DIAGNOSIS — Z5111 Encounter for antineoplastic chemotherapy: Secondary | ICD-10-CM | POA: Diagnosis not present

## 2023-02-17 DIAGNOSIS — C189 Malignant neoplasm of colon, unspecified: Secondary | ICD-10-CM

## 2023-02-17 DIAGNOSIS — C787 Secondary malignant neoplasm of liver and intrahepatic bile duct: Secondary | ICD-10-CM

## 2023-02-17 LAB — CBC WITH DIFFERENTIAL/PLATELET
Abs Immature Granulocytes: 0.02 10*3/uL (ref 0.00–0.07)
Basophils Absolute: 0.1 10*3/uL (ref 0.0–0.1)
Basophils Relative: 1 %
Eosinophils Absolute: 0.2 10*3/uL (ref 0.0–0.5)
Eosinophils Relative: 2 %
HCT: 42.5 % (ref 36.0–46.0)
Hemoglobin: 14.3 g/dL (ref 12.0–15.0)
Immature Granulocytes: 0 %
Lymphocytes Relative: 18 %
Lymphs Abs: 1.3 10*3/uL (ref 0.7–4.0)
MCH: 31 pg (ref 26.0–34.0)
MCHC: 33.6 g/dL (ref 30.0–36.0)
MCV: 92 fL (ref 80.0–100.0)
Monocytes Absolute: 0.7 10*3/uL (ref 0.1–1.0)
Monocytes Relative: 10 %
Neutro Abs: 4.9 10*3/uL (ref 1.7–7.7)
Neutrophils Relative %: 69 %
Platelets: 130 10*3/uL — ABNORMAL LOW (ref 150–400)
RBC: 4.62 MIL/uL (ref 3.87–5.11)
RDW: 16.7 % — ABNORMAL HIGH (ref 11.5–15.5)
WBC: 7.2 10*3/uL (ref 4.0–10.5)
nRBC: 0 % (ref 0.0–0.2)

## 2023-02-17 LAB — COMPREHENSIVE METABOLIC PANEL
ALT: 41 U/L (ref 0–44)
AST: 49 U/L — ABNORMAL HIGH (ref 15–41)
Albumin: 3.6 g/dL (ref 3.5–5.0)
Alkaline Phosphatase: 143 U/L — ABNORMAL HIGH (ref 38–126)
Anion gap: 12 (ref 5–15)
BUN: 9 mg/dL (ref 8–23)
CO2: 25 mmol/L (ref 22–32)
Calcium: 8.7 mg/dL — ABNORMAL LOW (ref 8.9–10.3)
Chloride: 95 mmol/L — ABNORMAL LOW (ref 98–111)
Creatinine, Ser: 0.91 mg/dL (ref 0.44–1.00)
GFR, Estimated: >60 mL/min (ref >60)
Glucose, Bld: 165 mg/dL — ABNORMAL HIGH (ref 70–99)
Potassium: 3.1 mmol/L — ABNORMAL LOW (ref 3.5–5.1)
Sodium: 132 mmol/L — ABNORMAL LOW (ref 135–145)
Total Bilirubin: 1.6 mg/dL — ABNORMAL HIGH (ref 0.3–1.2)
Total Protein: 7.3 g/dL (ref 6.5–8.1)

## 2023-02-17 LAB — MAGNESIUM: Magnesium: 1.9 mg/dL (ref 1.7–2.4)

## 2023-02-17 MED ORDER — SODIUM CHLORIDE 0.9 % IV SOLN
3250.0000 mg | INTRAVENOUS | Status: DC
  Administered 2023-02-17: 3250 mg via INTRAVENOUS
  Filled 2023-02-17: qty 65

## 2023-02-17 MED ORDER — SODIUM CHLORIDE 0.9% FLUSH
10.0000 mL | Freq: Once | INTRAVENOUS | Status: AC
  Administered 2023-02-17: 10 mL via INTRAVENOUS

## 2023-02-17 MED ORDER — SODIUM CHLORIDE 0.9 % IV SOLN
10.0000 mg | Freq: Once | INTRAVENOUS | Status: AC
  Administered 2023-02-17: 10 mg via INTRAVENOUS
  Filled 2023-02-17: qty 10

## 2023-02-17 MED ORDER — PALONOSETRON HCL INJECTION 0.25 MG/5ML
0.2500 mg | Freq: Once | INTRAVENOUS | Status: AC
  Administered 2023-02-17: 0.25 mg via INTRAVENOUS
  Filled 2023-02-17: qty 5

## 2023-02-17 MED ORDER — SODIUM CHLORIDE 0.9% FLUSH: 10.0000 mL | INTRAVENOUS | Status: DC | PRN

## 2023-02-17 MED ORDER — FLUOROURACIL CHEMO INJECTION 500 MG/10ML
270.0000 mg/m2 | Freq: Once | INTRAVENOUS | Status: AC
  Administered 2023-02-17: 500 mg via INTRAVENOUS
  Filled 2023-02-17: qty 10

## 2023-02-17 MED ORDER — SODIUM CHLORIDE 0.9 % IV SOLN
320.0000 mg/m2 | Freq: Once | INTRAVENOUS | Status: AC
  Administered 2023-02-17: 628 mg via INTRAVENOUS
  Filled 2023-02-17: qty 31.4

## 2023-02-17 MED ORDER — HEPARIN SOD (PORK) LOCK FLUSH 100 UNIT/ML IV SOLN: 500.0000 [IU] | Freq: Once | INTRAVENOUS | Status: DC | PRN

## 2023-02-17 MED ORDER — SODIUM CHLORIDE 0.9 % IV SOLN
5.0000 mg/kg | Freq: Once | INTRAVENOUS | Status: AC
  Administered 2023-02-17: 400 mg via INTRAVENOUS
  Filled 2023-02-17: qty 16

## 2023-02-17 MED ORDER — POTASSIUM CHLORIDE CRYS ER 20 MEQ PO TBCR
40.0000 meq | EXTENDED_RELEASE_TABLET | Freq: Once | ORAL | Status: AC
  Administered 2023-02-17: 40 meq via ORAL
  Filled 2023-02-17: qty 2

## 2023-02-17 MED ORDER — SODIUM CHLORIDE 0.9 % IV SOLN: Freq: Once | INTRAVENOUS | Status: AC

## 2023-02-17 NOTE — Progress Notes (Signed)
Patient has been examined by Dr. Ellin Saba. Vital signs and labs have been reviewed by MD - ANC, Creatinine, LFTs (bilirubin 1.6), hemoglobin, and platelets are within treatment parameters per M.D. - pt may proceed with treatment. Okay to tx w/urine protein 100 per MD. Primary RN and pharmacy notified.

## 2023-02-17 NOTE — Progress Notes (Signed)
Patient presents today for MVASI/Leucovorin/5FU with pump start per providers order.  Labs and vital signs reviewed by MD.  Message received from Chapman Moss RN/Dr. Ellin Saba patient okay for treatment.  Treatment given today per MD orders.  Tolerated infusion without adverse affects.  Vital signs stable.  5FU pump start and verified RUN on the screen with the patient.  No complaints at this time.  Discharge from clinic ambulatory in stable condition.  Alert and oriented X 3.  Follow up with [facility] as scheduled.

## 2023-02-17 NOTE — Patient Instructions (Signed)
[facility]  Discharge Instructions: Thank you for choosing [facility] to provide your oncology and hematology care.  If you have a lab appointment with the Cancer Center - please note that after April 8th, 2024, all labs will be drawn in the cancer center.  You do not have to check in or register with the main entrance as you have in the past but will complete your check-in in the cancer center.  Wear comfortable clothing and clothing appropriate for easy access to any Portacath or PICC line.   We strive to give you quality time with your provider. You may need to reschedule your appointment if you arrive late (15 or more minutes).  Arriving late affects you and other patients whose appointments are after yours.  Also, if you miss three or more appointments without notifying the office, you may be dismissed from the clinic at the provider's discretion.      For prescription refill requests, have your pharmacy contact our office and allow 72 hours for refills to be completed.    Today you received the following chemotherapy and/or immunotherapy agents MVASI/leucovorin/5fu      To help prevent nausea and vomiting after your treatment, we encourage you to take your nausea medication as directed.  BELOW ARE SYMPTOMS THAT SHOULD BE REPORTED IMMEDIATELY: *FEVER GREATER THAN 100.4 F (38 C) OR HIGHER *CHILLS OR SWEATING *NAUSEA AND VOMITING THAT IS NOT CONTROLLED WITH YOUR NAUSEA MEDICATION *UNUSUAL SHORTNESS OF BREATH *UNUSUAL BRUISING OR BLEEDING *URINARY PROBLEMS (pain or burning when urinating, or frequent urination) *BOWEL PROBLEMS (unusual diarrhea, constipation, pain near the anus) TENDERNESS IN MOUTH AND THROAT WITH OR WITHOUT PRESENCE OF ULCERS (sore throat, sores in mouth, or a toothache) UNUSUAL RASH, SWELLING OR PAIN  UNUSUAL VAGINAL DISCHARGE OR ITCHING   Items with * indicate a potential emergency and should be followed up as soon as possible or  go to the Emergency Department if any problems should occur.  Please show the CHEMOTHERAPY ALERT CARD or IMMUNOTHERAPY ALERT CARD at check-in to the Emergency Department and triage nurse.  Should you have questions after your visit or need to cancel or reschedule your appointment, please contact [facility] [phone]  and follow the prompts.  Office hours are 8:00 a.m. to 4:30 p.m. Monday - Friday. Please note that voicemails left after 4:00 p.m. may not be returned until the following business day.  We are closed weekends and major holidays. You have access to a nurse at all times for urgent questions. Please call the main number to the clinic [phone] and follow the prompts.  For any non-urgent questions, you may also contact your provider using MyChart. We now offer e-Visits for anyone 108 and older to request care online for non-urgent symptoms. For details visit [email or web address].   Also download the MyChart app! Go to the app store, search "MyChart", open the app, select [place], and log in with your MyChart username and password.

## 2023-02-17 NOTE — Patient Instructions (Addendum)
[facility] Discharge Instructions   You were seen and examined today by Dr. Ellin Saba.  He reviewed the results of your lab work which are mostly normal/stable. Your potassium is slightly low today at 3.1. We will increase your potassium pills you take at home to 40 mEq a day.   We will proceed with your treatment today.   We will repeat a CT scan prior to your next visit as your CEA is trending up. The CEA results from today is pending.   Return as scheduled.    Thank you for choosing  Cancer Center at [facility] to provide your oncology and hematology care.  To afford each patient quality time with our provider, please arrive at least 15 minutes before your scheduled appointment time.   If you have a lab appointment with the Cancer Center please come in thru the Main Entrance and check in at the main information desk.  You need to re-schedule your appointment should you arrive 10 or more minutes late.  We strive to give you quality time with our providers, and arriving late affects you and other patients whose appointments are after yours.  Also, if you no show three or more times for appointments you may be dismissed from the clinic at the providers discretion.     Again, thank you for choosing [facility].  Our hope is that these requests will decrease the amount of time that you wait before being seen by our physicians.       _____________________________________________________________  Should you have questions after your visit to [facility], please contact our office at [phone] and follow the prompts.  Our office hours are 8:00 a.m. and 4:30 p.m. Monday - Friday.  Please note that voicemails left after 4:00 p.m. may not be returned until the following business day.  We are closed weekends and major holidays.  You do have access to a nurse 24-7, just call the main number to the clinic  [phone] and do not press any options, hold on the line and a nurse will answer the phone.    For prescription refill requests, have your pharmacy contact our office and allow 72 hours.    Due to Covid, you will need to wear a mask upon entering the hospital. If you do not have a mask, a mask will be given to you at the Main Entrance upon arrival. For doctor visits, patients may have 1 support person age 58 or older with them. For treatment visits, patients can not have anyone with them due to social distancing guidelines and our immunocompromised population.

## 2023-02-18 LAB — CEA: CEA: 15.4 ng/mL — ABNORMAL HIGH (ref 0.0–4.7)

## 2023-02-19 ENCOUNTER — Inpatient Hospital Stay: Payer: No Typology Code available for payment source

## 2023-02-19 VITALS — BP 160/74 | HR 82 | Temp 98.6°F | Resp 18

## 2023-02-19 DIAGNOSIS — C189 Malignant neoplasm of colon, unspecified: Secondary | ICD-10-CM

## 2023-02-19 DIAGNOSIS — Z5111 Encounter for antineoplastic chemotherapy: Secondary | ICD-10-CM | POA: Diagnosis not present

## 2023-02-19 MED ORDER — HEPARIN SOD (PORK) LOCK FLUSH 100 UNIT/ML IV SOLN
500.0000 [IU] | Freq: Once | INTRAVENOUS | Status: AC | PRN
  Administered 2023-02-19: 500 [IU]

## 2023-02-19 MED ORDER — SODIUM CHLORIDE 0.9% FLUSH
10.0000 mL | INTRAVENOUS | Status: DC | PRN
  Administered 2023-02-19: 10 mL

## 2023-02-19 NOTE — Progress Notes (Signed)
Patient presents today to have 5FU pump disconnected and port de-accessed. 5FU pump removed without complication. Patients port flushed without difficulty.  Good blood return noted with no bruising or swelling noted at site. VSS with discharge and left in satisfactory condition with no s/s of distress noted.

## 2023-02-19 NOTE — Patient Instructions (Signed)
[facility]  Discharge Instructions: Thank you for choosing [facility] to provide your oncology and hematology care.  If you have a lab appointment with the Cancer Center - please note that after April 8th, 2024, all labs will be drawn in the cancer center.  You do not have to check in or register with the main entrance as you have in the past but will complete your check-in in the cancer center.  Wear comfortable clothing and clothing appropriate for easy access to any Portacath or PICC line.   We strive to give you quality time with your provider. You may need to reschedule your appointment if you arrive late (15 or more minutes).  Arriving late affects you and other patients whose appointments are after yours.  Also, if you miss three or more appointments without notifying the office, you may be dismissed from the clinic at the provider's discretion.      For prescription refill requests, have your pharmacy contact our office and allow 72 hours for refills to be completed.    Today you received the following pump stop.  To help prevent nausea and vomiting after your treatment, we encourage you to take your nausea medication as directed.  BELOW ARE SYMPTOMS THAT SHOULD BE REPORTED IMMEDIATELY: *FEVER GREATER THAN 100.4 F (38 C) OR HIGHER *CHILLS OR SWEATING *NAUSEA AND VOMITING THAT IS NOT CONTROLLED WITH YOUR NAUSEA MEDICATION *UNUSUAL SHORTNESS OF BREATH *UNUSUAL BRUISING OR BLEEDING *URINARY PROBLEMS (pain or burning when urinating, or frequent urination) *BOWEL PROBLEMS (unusual diarrhea, constipation, pain near the anus) TENDERNESS IN MOUTH AND THROAT WITH OR WITHOUT PRESENCE OF ULCERS (sore throat, sores in mouth, or a toothache) UNUSUAL RASH, SWELLING OR PAIN  UNUSUAL VAGINAL DISCHARGE OR ITCHING   Items with * indicate a potential emergency and should be followed up as soon as possible or go to the Emergency Department if any problems should  occur.  Please show the CHEMOTHERAPY ALERT CARD or IMMUNOTHERAPY ALERT CARD at check-in to the Emergency Department and triage nurse.  Should you have questions after your visit or need to cancel or reschedule your appointment, please contact [facility] [phone]  and follow the prompts.  Office hours are 8:00 a.m. to 4:30 p.m. Monday - Friday. Please note that voicemails left after 4:00 p.m. may not be returned until the following business day.  We are closed weekends and major holidays. You have access to a nurse at all times for urgent questions. Please call the main number to the clinic [phone] and follow the prompts.  For any non-urgent questions, you may also contact your provider using MyChart. We now offer e-Visits for anyone 47 and older to request care online for non-urgent symptoms. For details visit [email or web address].   Also download the MyChart app! Go to the app store, search "MyChart", open the app, select [facility], and log in with your MyChart username and password.

## 2023-02-25 ENCOUNTER — Ambulatory Visit
Admission: RE | Admit: 2023-02-25 | Discharge: 2023-02-25 | Disposition: A | Discharge status: Home / Self Care | Payer: No Typology Code available for payment source | Source: Ambulatory Visit | Attending: Hematology | Admitting: Hematology

## 2023-02-25 DIAGNOSIS — C785 Secondary malignant neoplasm of large intestine and rectum: Secondary | ICD-10-CM | POA: Diagnosis not present

## 2023-02-25 DIAGNOSIS — I700 Atherosclerosis of aorta: Secondary | ICD-10-CM | POA: Diagnosis not present

## 2023-02-25 DIAGNOSIS — C787 Secondary malignant neoplasm of liver and intrahepatic bile duct: Secondary | ICD-10-CM | POA: Insufficient documentation

## 2023-02-25 DIAGNOSIS — C189 Malignant neoplasm of colon, unspecified: Secondary | ICD-10-CM | POA: Diagnosis not present

## 2023-02-25 DIAGNOSIS — Z933 Colostomy status: Secondary | ICD-10-CM | POA: Diagnosis not present

## 2023-02-25 MED ORDER — IOHEXOL 300 MG/ML  SOLN
100.0000 mL | Freq: Once | INTRAMUSCULAR | Status: AC | PRN
  Administered 2023-02-25: 100 mL via INTRAVENOUS

## 2023-02-25 MED ORDER — HEPARIN SOD (PORK) LOCK FLUSH 100 UNIT/ML IV SOLN
INTRAVENOUS | Status: AC
  Filled 2023-02-25: qty 5

## 2023-02-25 NOTE — Progress Notes (Signed)
Susan Martin with [facility] radiology called concerning CT scan from 02/25/23 at 10:30. IMPRESSION: Overall findings compatible with disease progression including: 1. Increased size and conspicuity of the hepatic metastasis. 2. New scattered bilateral pulmonary nodules, concerning for pulmonary metastasis. 3. Increased nodular mesenteric and omental stranding, concerning for peritoneal carcinomatosis. 4. Prior left hemicolectomy with left anterior abdominal wall colostomy. No new suspicious nodularity along the suture line. 5. Aortic Atherosclerosis (ICD10-I70.0).   [name], MD made aware of results. Patient is scheduled for appointment 03/03/23 at 1000. No further response at this time.

## 2023-03-02 NOTE — Progress Notes (Signed)
[facility] [address] [place] [address]    Clinic Day:  03/03/2023  Referring physician: [name], MD  Patient Care Team: [name], MD as PCP - General (Hematology) Therese Sarah, RN as Oncology Nurse Navigator (Oncology)   ASSESSMENT & PLAN:   Assessment: 1.  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 -CT chest on 05/02/2020 with no evidence of pulmonary metastatic disease. -CTAP on 05/07/2020 with multiple lesions throughout the liver, largest in the right lobe measuring 3.9 cm, lateral segment left lobe measuring 2.6 cm and inferior right lobe confluent lesion 4.2 x 3.7 cm.  No lymphadenopathy. -CEA on 05/02/2020-60.2. -FOLFOX and bevacizumab started on 06/04/2020. 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   2.  Iron deficiency anemia: -Feraheme on 04/30/2020 and 05/06/2020.   3.  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 -Smoked for 3 to 4 years and quit. -Mother had cancer, type unknown.  Maternal uncle had lung cancer.  Maternal aunt had lung cancer.    Plan: 1.  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 - Labs today shows AST increased to 50.  Rest of the LFTs show elevated total bilirubin 1.5.  Creatinine 1.02.  CBC grossly normal with thrombocytopenia with platelet count 119.  UA shows protein 100 stable. - Reviewed CT CAP from 02/25/2023: Increasing in size of liver metastasis with new scattered lung nodules bilaterally.  Increased nodular mesenteric and omental stranding. - Based on these findings, I have recommended switching therapy.  We  discussed both FOLFOX and FOLFIRI with bevacizumab.  We discussed side effects of both regimens.  She prefers to go back on FOLFOX and bevacizumab regimen. - We will obtain insurance authorization and likely start her next week.  I will see her back in 5 weeks for follow-up.   2.  Hypertension: - Continue Norvasc 10 mg daily and triamterene/HCTZ daily.  Blood pressure today is 155/87.   3.  Severe hypokalemia: - Continue potassium 40 mill equivalents daily.  Potassium today is 3.4.   4.  Peripheral neuropathy: - Continue gabapentin 100 mg 3 times daily.   5.  Anxiety: - Continue Klonopin 1 mg twice daily.  Continue Paxil 40 mg daily.  6.  Mucositis: - Continue lidocaine gel as needed.    Orders Placed This Encounter  Procedures   Magnesium    Standing Status:   Future    Standing Expiration Date:   05/18/2024   CBC with Differential    Standing Status:   Future    Standing Expiration Date:   05/18/2024   Comprehensive metabolic panel    Standing Status:   Future    Standing Expiration Date:   05/18/2024   Urinalysis, dipstick only    Standing Status:   Future    Standing Expiration Date:   05/18/2024   Magnesium    Standing Status:   Future    Standing Expiration Date:   06/01/2024   CBC with Differential    Standing Status:   Future    Standing Expiration Date:   06/01/2024   Comprehensive metabolic panel    Standing Status:   Future    Standing Expiration Date:   06/01/2024  Urinalysis, dipstick only    Standing Status:   Future    Standing Expiration Date:   06/01/2024   Magnesium    Standing Status:   Future    Standing Expiration Date:   06/15/2024   CBC with Differential    Standing Status:   Future    Standing Expiration Date:   06/15/2024   Comprehensive metabolic panel    Standing Status:   Future    Standing Expiration Date:   06/15/2024   Urinalysis, dipstick only    Standing Status:   Future    Standing Expiration Date:   06/15/2024      I,Katie  Daubenspeck,acting as a scribe for [name], MD.,have documented all relevant documentation on the behalf of [name], MD,as directed by  [name], MD while in the presence of [name], MD.   I, [name] MD, have reviewed the above documentation for accuracy and completeness, and I agree with the above.   [name], MD   9/4/20244:44 PM  CHIEF COMPLAINT:   Diagnosis: metastatic colon cancer to liver    Cancer Staging  No matching staging information was found for the patient.    Prior Therapy: 1. Sigmoid colectomy and end colostomy on 04/29/2020  2. FOLFOX with bevacizumab, 06/04/20 - 11/06/20  Current Therapy:  Maintenance 5-FU and bevacizumab    HISTORY OF PRESENT ILLNESS:   Oncology History  Metastatic colon cancer to liver (HCC)  05/16/2020 Initial Diagnosis   Metastatic colon cancer to liver (HCC)   06/03/2020 Genetic Testing   Foundation One:     06/04/2020 - 02/25/2022 Chemotherapy   Patient is on Treatment Plan : COLORECTAL FOLFOX + Bevacizumab q14d     06/04/2020 -  Chemotherapy   Patient is on Treatment Plan : COLORECTAL FOLFOX + Bevacizumab q14d        INTERVAL HISTORY:   Susan Martin is a 62 y.o. female presenting to clinic today for follow up of metastatic colon cancer to liver. She was last seen by me on 02/17/23.  Since her last visit, she underwent restaging CT C/A/P on 02/25/23 showing: findings overall compatible with disease progression-- increased size and conspicuity of hepatic metastasis, new scattered bilateral pulmonary nodules, and increased nodular mesenteric and omental stranding; no evidence of local recurrence.  Today, she states that she is doing well overall. Her appetite level is at 90%. Her energy level is at 70%.  PAST MEDICAL HISTORY:   Past Medical History: Past Medical History:  Diagnosis Date   Adenocarcinoma of colon metastatic to liver [facility]) onocology--- dr s. Ellin Saba    04-29-2020 [facility] surgery for perforated colon s/p sigmoid colectomy w/ colostomy; dx  Stage IV colon cancer mets to liver   Anxiety    Depression    Hypertension    followed by dr Emelda Fear and oncology  (05-27-2020 per pt does not have pcp yet)   IDA (iron deficiency anemia)    Pulmonary nodule, right     Surgical History: Past Surgical History:  Procedure Laterality Date   ABDOMINAL HYSTERECTOMY  03-31-2016   @[facility]    W/  BILATERAL SALPINOOPHORECTOMY    APPLICATION OF WOUND VAC N/A 04/29/2020   Procedure: APPLICATION OF WOUND VAC;  Surgeon: Rodman Pickle, MD;  Location: [facility] OR;  Service: General;  Laterality: N/A;   ENDOMETRIAL ABLATION     LAPAROTOMY N/A 04/29/2020   Procedure: EXPLORATORY LAPAROTOMY;  Surgeon: Rodman Pickle, MD;  Location: [facility] OR;  Service: General;  Laterality: N/A;  PARTIAL COLECTOMY N/A 04/29/2020   Procedure: PARTIAL COLECTOMY WITH END COLOSTOMY;  Surgeon: Kinsinger, De Blanch, MD;  Location: [facility] OR;  Service: General;  Laterality: N/A;   PORTACATH PLACEMENT Right 05/28/2020   Procedure: INSERTION PORT-A-CATH;  Surgeon: Sheliah Hatch De Blanch, MD;  Location: [facility];  Service: General;  Laterality: Right;   SALPINGOOPHORECTOMY Left 03/31/2016   Procedure: LEFT SALPINGO OOPHORECTOMY WITH FROZEN SECTION,  ABDOMINAL HYSTERECTOMY WITH BILATERAL SALPINGO-OOPHORECTOMY;  Surgeon: Tilda Burrow, MD;  Location: [facility];  Service: Gynecology;  Laterality: Left;    Social History: Social History   Socioeconomic History   Marital status: Divorced    Spouse name: Not on file   Number of children: Not on file   Years of education: Not on file   Highest education level: Not on file  Occupational History   Not on file  Tobacco Use   Smoking status: Former    Current packs/day: 0.00    Average packs/day: 0.5 packs/day for 5.0 years (2.5 ttl pk-yrs)    Types: Cigarettes    Start date: 03/27/2010    Quit date: 03/28/2015    Years  since quitting: 7.9   Smokeless tobacco: Never  Vaping Use   Vaping status: Never Used  Substance and Sexual Activity   Alcohol use: No   Drug use: Never   Sexual activity: Yes    Partners: Male    Birth control/protection: Surgical  Other Topics Concern   Not on file  Social History Narrative   Not on file   Social Determinants of Health   Financial Resource Strain: Low Risk  (11/01/2019)   Overall Financial Resource Strain (CARDIA)    Difficulty of Paying Living Expenses: Not very hard  Food Insecurity: No Food Insecurity (11/01/2019)   Hunger Vital Sign    Worried About Running Out of Food in the Last Year: Never true    Ran Out of Food in the Last Year: Never true  Transportation Needs: No Transportation Needs (11/01/2019)   PRAPARE - Administrator, Civil Service (Medical): No    Lack of Transportation (Non-Medical): No  Physical Activity: Insufficiently Active (11/01/2019)   Exercise Vital Sign    Days of Exercise per Week: 2 days    Minutes of Exercise per Session: 30 min  Stress: Stress Concern Present (11/01/2019)   [employer] of Occupational Health - Occupational Stress Questionnaire    Feeling of Stress : To some extent  Social Connections: Moderately Integrated (11/01/2019)   Social Connection and Isolation Panel [NHANES]    Frequency of Communication with Friends and Family: More than three times a week    Frequency of Social Gatherings with Friends and Family: Once a week    Attends Religious Services: More than 4 times per year    Active Member of [employer] or Organizations: No    Attends Banker Meetings: Never    Marital Status: Living with partner  Intimate Partner Violence: Not At Risk (11/01/2019)   Humiliation, Afraid, Rape, and Kick questionnaire    Fear of Current or Ex-Partner: No    Emotionally Abused: No    Physically Abused: No    Sexually Abused: No    Family History: Family History  Problem Relation Age of Onset    Hypertension Mother    Diabetes Mother    Cirrhosis Father     Current Medications:  Current Outpatient Medications:    amLODipine (NORVASC) 10 MG tablet, Take 1 tablet (10 mg  total) by mouth daily., Disp: 30 tablet, Rfl: 6   BEVACIZUMAB IV, Inject 5 mg/kg into the vein every 14 (fourteen) days., Disp: , Rfl:    clonazePAM (KLONOPIN) 1 MG tablet, TAKE 1 TABLET (1 MG TOTAL) BY MOUTH IN THE MORNING, AT NOON, AND AT BEDTIME., Disp: 90 tablet, Rfl: 3   fluorouracil CALGB 87564 in sodium chloride 0.9 % 150 mL, Inject 2,400 mg/m2 into the vein over 48 hr., Disp: , Rfl:    gabapentin (NEURONTIN) 100 MG capsule, TAKE 1 CAPSULE (100 MG TOTAL) BY MOUTH THREE TIMES DAILY., Disp: 90 capsule, Rfl: 3   LEUCOVORIN CALCIUM IV, Inject 400 mg/m2 into the vein every 21 ( twenty-one) days., Disp: , Rfl:    lidocaine (XYLOCAINE) 2 % solution, Use as directed 15 mLs in the mouth or throat every 6 (six) hours as needed for mouth pain. Swish and spit/swallow every six hours as needed for mouth pain, Disp: 480 mL, Rfl: 3   lidocaine-prilocaine (EMLA) cream, Apply 1 Application topically as needed., Disp: 30 g, Rfl: 0   nystatin (MYCOSTATIN) 100000 UNIT/ML suspension, Take by mouth., Disp: , Rfl:    oxyCODONE (OXY IR/ROXICODONE) 5 MG immediate release tablet, Take 5 mg by mouth every 6 (six) hours as needed. for pain, Disp: , Rfl:    PARoxetine (PAXIL) 40 MG tablet, Take 1 tablet (40 mg total) by mouth every morning., Disp: 90 tablet, Rfl: 3   polyethylene glycol (MIRALAX / GLYCOLAX) packet, Take 17 g by mouth every other day., Disp: , Rfl:    potassium chloride (KLOR-CON M10) 10 MEQ tablet, Take 2 tablets (20 mEq total) by mouth daily., Disp: 180 tablet, Rfl: 3   triamterene-hydrochlorothiazide (MAXZIDE-25) 37.5-25 MG tablet, TAKE 1 TABLET BY MOUTH EVERY DAY, Disp: 90 tablet, Rfl: 1   Allergies: No Known Allergies  REVIEW OF SYSTEMS:   Review of Systems  Constitutional:  Negative for chills, fatigue and fever.   HENT:   Negative for lump/mass, mouth sores, nosebleeds, sore throat and trouble swallowing.   Eyes:  Negative for eye problems.  Respiratory:  Negative for cough and shortness of breath.   Cardiovascular:  Negative for chest pain, leg swelling and palpitations.  Gastrointestinal:  Positive for diarrhea. Negative for abdominal pain, constipation, nausea and vomiting.  Genitourinary:  Negative for bladder incontinence, difficulty urinating, dysuria, frequency, hematuria and nocturia.   Musculoskeletal:  Negative for arthralgias, back pain, flank pain, myalgias and neck pain.  Skin:  Negative for itching and rash.  Neurological:  Positive for numbness. Negative for dizziness and headaches.  Hematological:  Does not bruise/bleed easily.  Psychiatric/Behavioral:  Positive for depression and sleep disturbance. Negative for suicidal ideas. The patient is nervous/anxious.   All other systems reviewed and are negative.    VITALS:   There were no vitals taken for this visit.  Wt Readings from Last 3 Encounters:  03/03/23 185 lb (83.9 kg)  02/17/23 185 lb 6.4 oz (84.1 kg)  02/03/23 188 lb 3.2 oz (85.4 kg)    There is no height or weight on file to calculate BMI.  Performance status (ECOG): 1 - Symptomatic but completely ambulatory  PHYSICAL EXAM:   Physical Exam Vitals and nursing note reviewed. Exam conducted with a chaperone present.  Constitutional:      Appearance: Normal appearance.  Cardiovascular:     Rate and Rhythm: Normal rate and regular rhythm.     Pulses: Normal pulses.     Heart sounds: Normal heart sounds.  Pulmonary:  Effort: Pulmonary effort is normal.     Breath sounds: Normal breath sounds.  Abdominal:     Palpations: Abdomen is soft. There is no hepatomegaly, splenomegaly or mass.     Tenderness: There is no abdominal tenderness.  Musculoskeletal:     Right lower leg: No edema.     Left lower leg: No edema.  Lymphadenopathy:     Cervical: No cervical  adenopathy.     Right cervical: No superficial, deep or posterior cervical adenopathy.    Left cervical: No superficial, deep or posterior cervical adenopathy.     Upper Body:     Right upper body: No supraclavicular or axillary adenopathy.     Left upper body: No supraclavicular or axillary adenopathy.  Neurological:     General: No focal deficit present.     Mental Status: She is alert and oriented to person, place, and time.  Psychiatric:        Mood and Affect: Mood normal.        Behavior: Behavior normal.     LABS:      Latest Ref Rng & Units 03/03/2023    8:58 AM 02/17/2023    8:18 AM 02/03/2023    8:28 AM  CBC  WBC 4.0 - 10.5 K/uL 7.4  7.2  7.1   Hemoglobin 12.0 - 15.0 g/dL 16.1  09.6  04.5   Hematocrit 36.0 - 46.0 % 42.4  42.5  42.1   Platelets 150 - 400 K/uL 119  130  112       Latest Ref Rng & Units 03/03/2023    8:58 AM 02/17/2023    8:18 AM 02/03/2023    8:28 AM  CMP  Glucose 70 - 99 mg/dL 409  811  914   BUN 8 - 23 mg/dL 11  9  13    Creatinine 0.44 - 1.00 mg/dL 7.82  9.56  2.13   Sodium 135 - 145 mmol/L 133  132  130   Potassium 3.5 - 5.1 mmol/L 3.4  3.1  3.2   Chloride 98 - 111 mmol/L 96  95  94   CO2 22 - 32 mmol/L 24  25  26    Calcium 8.9 - 10.3 mg/dL 9.0  8.7  8.7   Total Protein 6.5 - 8.1 g/dL 7.1  7.3  7.3   Total Bilirubin 0.3 - 1.2 mg/dL 1.5  1.6  1.4   Alkaline Phos 38 - 126 U/L 153  143  143   AST 15 - 41 U/L 50  49  43   ALT 0 - 44 U/L 39  41  38      Lab Results  Component Value Date   CEA1 15.4 (H) 02/17/2023   /  CEA  Date Value Ref Range Status  02/17/2023 15.4 (H) 0.0 - 4.7 ng/mL Final    Comment:    (NOTE)                             Nonsmokers          <3.9                             Smokers             <5.6 Roche [employer]ostics Electrochemiluminescence Immunoassay (ECLIA) Values obtained with different assay methods or kits cannot be used interchangeably.  Results cannot be interpreted as absolute evidence  of the presence  or absence of malignant disease. Performed At: [facility] [address] [place], [place] [id number] Jolene Schimke MD NW:[id number]    No results found for: "PSA1" No results found for: "[id number]" No results found for: "CAN125"  No results found for: "TOTALPROTELP", "ALBUMINELP", "A1GS", "A2GS", "BETS", "BETA2SER", "GAMS", "MSPIKE", "SPEI" Lab Results  Component Value Date   TIBC 248 (L) 04/29/2020   FERRITIN 57 04/29/2020   IRONPCTSAT 3 (L) 04/29/2020   No results found for: "LDH"   STUDIES:   CT CHEST ABDOMEN PELVIS W CONTRAST  Result Date: 02/25/2023 CLINICAL DATA:  History of metastatic colon cancer, follow-up. * Tracking Code: BO * EXAM: CT CHEST, ABDOMEN, AND PELVIS WITH CONTRAST TECHNIQUE: Multidetector CT imaging of the chest, abdomen and pelvis was performed following the standard protocol during bolus administration of intravenous contrast. RADIATION DOSE REDUCTION: This exam was performed according to the departmental dose-optimization program which includes automated exposure control, adjustment of the mA and/or kV according to patient size and/or use of iterative reconstruction technique. CONTRAST:  OMNIPAQUE IOHEXOL 300 MG/ML  SOLN COMPARISON:  Multiple priors including most recent CT Nov 06, 2022 FINDINGS: CT CHEST FINDINGS Cardiovascular: Accessed right chest Port-A-Cath catheter with tip near the superior cavoatrial junction. Normal caliber thoracic aorta. No central pulmonary embolus on this nondedicated study. Normal size heart. No significant pericardial effusion/thickening. Mediastinum/Nodes: No suspicious thyroid nodule no pathologically enlarged mediastinal, hilar or axillary lymph nodes. Reflux versus retained contrast in the esophagus Lungs/Pleura: New scattered bilateral pulmonary nodules. For reference: -5 mm right upper lobe pulmonary nodule on image 33/4 -4 mm pulmonary nodule in the inferior lingula on image 80/4. -7 mm right lower lobe pulmonary  nodule on image 62/4. No pleural effusion.  No pneumothorax. Musculoskeletal: No aggressive lytic or blastic lesion of bone. CT ABDOMEN PELVIS FINDINGS Hepatobiliary: Increased size and conspicuity of the hepatic metastasis. For reference: -lesion in the dome of the liver measures 3.4 x 1.8 cm on image 51/2 previously 2.8 x 1.0 cm -lesion in the posterior right lobe of the liver segment VI/VII measures 2.4 x 2.4 cm on image 57/2 previously 17 x 14 mm -ill-defined segment II hepatic lesion best seen on coronal image 51/5 measures 19 x 9 mm on image 48/2 Gallbladder is unremarkable for degree of distension. No biliary ductal dilation. Pancreas: No pancreatic ductal dilation or evidence of acute inflammation. Spleen: No splenomegaly or focal splenic lesion. Adrenals/Urinary Tract: Bilateral adrenal glands appear normal. No hydronephrosis. Kidneys demonstrate symmetric enhancement. Urinary bladder is unremarkable for degree of distension. Stomach/Bowel: Radiopaque enteric contrast material traverses the left anterior abdominal wall colostomy. Prior left hemicolectomy with Hartmann's pouch formation. No new suspicious nodularity along the Hartmann's pouch suture line. No evidence of bowel obstruction. Vascular/Lymphatic: Normal caliber abdominal aorta. Aortic atherosclerosis. Smooth IVC contours. The portal, splenic and superior mesenteric veins are patent. No pathologically enlarged abdominal or pelvic lymph nodes. Reproductive: Status post hysterectomy. No adnexal masses. Other: Increased nodular mesenteric and omental stranding. Musculoskeletal: No aggressive lytic or blastic lesion of bone. IMPRESSION: Overall findings compatible with disease progression including: 1. Increased size and conspicuity of the hepatic metastasis. 2. New scattered bilateral pulmonary nodules, concerning for pulmonary metastasis. 3. Increased nodular mesenteric and omental stranding, concerning for peritoneal carcinomatosis. 4. Prior left  hemicolectomy with left anterior abdominal wall colostomy. No new suspicious nodularity along the suture line. 5.  Aortic Atherosclerosis (ICD10-I70.0). These results will be called to the ordering clinician or representative by the Radiologist Assistant, and  communication documented in the PACS or [employer]. Electronically Signed   By: Maudry Mayhew M.D.   On: 02/25/2023 11:03

## 2023-03-03 ENCOUNTER — Inpatient Hospital Stay: Payer: No Typology Code available for payment source | Attending: Hematology

## 2023-03-03 ENCOUNTER — Inpatient Hospital Stay: Payer: No Typology Code available for payment source

## 2023-03-03 ENCOUNTER — Inpatient Hospital Stay (HOSPITAL_BASED_OUTPATIENT_CLINIC_OR_DEPARTMENT_OTHER): Payer: No Typology Code available for payment source | Admitting: Hematology

## 2023-03-03 ENCOUNTER — Inpatient Hospital Stay: Payer: No Typology Code available for payment source | Admitting: Hematology

## 2023-03-03 DIAGNOSIS — C187 Malignant neoplasm of sigmoid colon: Secondary | ICD-10-CM | POA: Insufficient documentation

## 2023-03-03 DIAGNOSIS — G629 Polyneuropathy, unspecified: Secondary | ICD-10-CM | POA: Diagnosis not present

## 2023-03-03 DIAGNOSIS — Z87891 Personal history of nicotine dependence: Secondary | ICD-10-CM | POA: Diagnosis not present

## 2023-03-03 DIAGNOSIS — C787 Secondary malignant neoplasm of liver and intrahepatic bile duct: Secondary | ICD-10-CM

## 2023-03-03 DIAGNOSIS — C189 Malignant neoplasm of colon, unspecified: Secondary | ICD-10-CM

## 2023-03-03 DIAGNOSIS — Z801 Family history of malignant neoplasm of trachea, bronchus and lung: Secondary | ICD-10-CM | POA: Insufficient documentation

## 2023-03-03 DIAGNOSIS — Z5111 Encounter for antineoplastic chemotherapy: Secondary | ICD-10-CM | POA: Insufficient documentation

## 2023-03-03 DIAGNOSIS — E876 Hypokalemia: Secondary | ICD-10-CM | POA: Diagnosis not present

## 2023-03-03 DIAGNOSIS — I700 Atherosclerosis of aorta: Secondary | ICD-10-CM | POA: Insufficient documentation

## 2023-03-03 DIAGNOSIS — D509 Iron deficiency anemia, unspecified: Secondary | ICD-10-CM | POA: Insufficient documentation

## 2023-03-03 DIAGNOSIS — R918 Other nonspecific abnormal finding of lung field: Secondary | ICD-10-CM | POA: Insufficient documentation

## 2023-03-03 DIAGNOSIS — I10 Essential (primary) hypertension: Secondary | ICD-10-CM | POA: Diagnosis not present

## 2023-03-03 DIAGNOSIS — Z809 Family history of malignant neoplasm, unspecified: Secondary | ICD-10-CM | POA: Insufficient documentation

## 2023-03-03 DIAGNOSIS — K1230 Oral mucositis (ulcerative), unspecified: Secondary | ICD-10-CM | POA: Diagnosis not present

## 2023-03-03 LAB — COMPREHENSIVE METABOLIC PANEL
ALT: 39 U/L (ref 0–44)
AST: 50 U/L — ABNORMAL HIGH (ref 15–41)
Albumin: 3.5 g/dL (ref 3.5–5.0)
Alkaline Phosphatase: 153 U/L — ABNORMAL HIGH (ref 38–126)
Anion gap: 13 (ref 5–15)
BUN: 11 mg/dL (ref 8–23)
CO2: 24 mmol/L (ref 22–32)
Calcium: 9 mg/dL (ref 8.9–10.3)
Chloride: 96 mmol/L — ABNORMAL LOW (ref 98–111)
Creatinine, Ser: 1.02 mg/dL — ABNORMAL HIGH (ref 0.44–1.00)
GFR, Estimated: >60 mL/min (ref >60)
Glucose, Bld: 156 mg/dL — ABNORMAL HIGH (ref 70–99)
Potassium: 3.4 mmol/L — ABNORMAL LOW (ref 3.5–5.1)
Sodium: 133 mmol/L — ABNORMAL LOW (ref 135–145)
Total Bilirubin: 1.5 mg/dL — ABNORMAL HIGH (ref 0.3–1.2)
Total Protein: 7.1 g/dL (ref 6.5–8.1)

## 2023-03-03 LAB — CBC WITH DIFFERENTIAL/PLATELET
Abs Immature Granulocytes: 0.02 10*3/uL (ref 0.00–0.07)
Basophils Absolute: 0 10*3/uL (ref 0.0–0.1)
Basophils Relative: 1 %
Eosinophils Absolute: 0.1 10*3/uL (ref 0.0–0.5)
Eosinophils Relative: 2 %
HCT: 42.4 % (ref 36.0–46.0)
Hemoglobin: 14.3 g/dL (ref 12.0–15.0)
Immature Granulocytes: 0 %
Lymphocytes Relative: 20 %
Lymphs Abs: 1.5 10*3/uL (ref 0.7–4.0)
MCH: 31 pg (ref 26.0–34.0)
MCHC: 33.7 g/dL (ref 30.0–36.0)
MCV: 92 fL (ref 80.0–100.0)
Monocytes Absolute: 0.7 10*3/uL (ref 0.1–1.0)
Monocytes Relative: 9 %
Neutro Abs: 5.1 10*3/uL (ref 1.7–7.7)
Neutrophils Relative %: 68 %
Platelets: 119 10*3/uL — ABNORMAL LOW (ref 150–400)
RBC: 4.61 MIL/uL (ref 3.87–5.11)
RDW: 17.3 % — ABNORMAL HIGH (ref 11.5–15.5)
WBC: 7.4 10*3/uL (ref 4.0–10.5)
nRBC: 0 % (ref 0.0–0.2)

## 2023-03-03 LAB — URINALYSIS, DIPSTICK ONLY
Bilirubin Urine: NEGATIVE
Glucose, UA: NEGATIVE mg/dL
Hgb urine dipstick: NEGATIVE
Ketones, ur: NEGATIVE mg/dL
Nitrite: NEGATIVE
Protein, ur: 100 mg/dL — AB
Specific Gravity, Urine: 1.013 (ref 1.005–1.030)
pH: 6 (ref 5.0–8.0)

## 2023-03-03 LAB — MAGNESIUM: Magnesium: 1.8 mg/dL (ref 1.7–2.4)

## 2023-03-03 MED ORDER — SODIUM CHLORIDE 0.9% FLUSH
10.0000 mL | Freq: Once | INTRAVENOUS | Status: AC
  Administered 2023-03-03: 10 mL via INTRAVENOUS

## 2023-03-03 MED ORDER — HEPARIN SOD (PORK) LOCK FLUSH 100 UNIT/ML IV SOLN
500.0000 [IU] | Freq: Once | INTRAVENOUS | Status: AC
  Administered 2023-03-03: 500 [IU] via INTRAVENOUS

## 2023-03-03 NOTE — Progress Notes (Signed)
Patients port flushed without difficulty.  Good blood return noted with no bruising or swelling noted at site.  Band aid applied.  VSS with discharge and left in satisfactory condition with no s/s of distress noted.   

## 2023-03-03 NOTE — Patient Instructions (Addendum)
[facility] at [facility] Discharge Instructions   You were seen and examined today by Dr. Ellin Saba.  He reviewed the results of your lab work which are normal/stable.   He reviewed the results of your CT scan. It is showing that the cancer has grown. We need to add a chemotherapy drug back to your regimen. We will add back the oxaliplatin chemotheapy that you received in the past.   We will proceed with your treatment next week.   Return as scheduled.    Thank you for choosing [facility] at [facility] to provide your oncology and hematology care.  To afford each patient quality time with our provider, please arrive at least 15 minutes before your scheduled appointment time.   If you have a lab appointment with the Cancer Center please come in thru the Main Entrance and check in at the main information desk.  You need to re-schedule your appointment should you arrive 10 or more minutes late.  We strive to give you quality time with our providers, and arriving late affects you and other patients whose appointments are after yours.  Also, if you no show three or more times for appointments you may be dismissed from the clinic at the providers discretion.     Again, thank you for choosing [facility].  Our hope is that these requests will decrease the amount of time that you wait before being seen by our physicians.       _____________________________________________________________  Should you have questions after your visit to [facility], please contact our office at [phone] and follow the prompts.  Our office hours are 8:00 a.m. and 4:30 p.m. Monday - Friday.  Please note that voicemails left after 4:00 p.m. may not be returned until the following business day.  We are closed weekends and major holidays.  You do have access to a nurse 24-7, just call the main number to the clinic [phone] and do not press  any options, hold on the line and a nurse will answer the phone.    For prescription refill requests, have your pharmacy contact our office and allow 72 hours.    Due to Covid, you will need to wear a mask upon entering the hospital. If you do not have a mask, a mask will be given to you at the Main Entrance upon arrival. For doctor visits, patients may have 1 support person age 88 or older with them. For treatment visits, patients can not have anyone with them due to social distancing guidelines and our immunocompromised population.

## 2023-03-05 ENCOUNTER — Inpatient Hospital Stay: Payer: No Typology Code available for payment source

## 2023-03-08 ENCOUNTER — Other Ambulatory Visit: Payer: Self-pay | Admitting: Hematology

## 2023-03-10 ENCOUNTER — Inpatient Hospital Stay: Payer: No Typology Code available for payment source

## 2023-03-10 VITALS — BP 140/67

## 2023-03-10 DIAGNOSIS — Z95828 Presence of other vascular implants and grafts: Secondary | ICD-10-CM

## 2023-03-10 DIAGNOSIS — C189 Malignant neoplasm of colon, unspecified: Secondary | ICD-10-CM

## 2023-03-10 DIAGNOSIS — E876 Hypokalemia: Secondary | ICD-10-CM

## 2023-03-10 DIAGNOSIS — Z5111 Encounter for antineoplastic chemotherapy: Secondary | ICD-10-CM | POA: Diagnosis not present

## 2023-03-10 LAB — CBC WITH DIFFERENTIAL/PLATELET
Abs Immature Granulocytes: 0.02 10*3/uL (ref 0.00–0.07)
Basophils Absolute: 0 10*3/uL (ref 0.0–0.1)
Basophils Relative: 1 %
Eosinophils Absolute: 0.2 10*3/uL (ref 0.0–0.5)
Eosinophils Relative: 4 %
HCT: 41.9 % (ref 36.0–46.0)
Hemoglobin: 14.1 g/dL (ref 12.0–15.0)
Immature Granulocytes: 0 %
Lymphocytes Relative: 29 %
Lymphs Abs: 1.4 10*3/uL (ref 0.7–4.0)
MCH: 31 pg (ref 26.0–34.0)
MCHC: 33.7 g/dL (ref 30.0–36.0)
MCV: 92.1 fL (ref 80.0–100.0)
Monocytes Absolute: 0.8 10*3/uL (ref 0.1–1.0)
Monocytes Relative: 16 %
Neutro Abs: 2.4 10*3/uL (ref 1.7–7.7)
Neutrophils Relative %: 50 %
Platelets: 155 10*3/uL (ref 150–400)
RBC: 4.55 MIL/uL (ref 3.87–5.11)
RDW: 16.9 % — ABNORMAL HIGH (ref 11.5–15.5)
WBC: 4.8 10*3/uL (ref 4.0–10.5)
nRBC: 0 % (ref 0.0–0.2)

## 2023-03-10 LAB — URINALYSIS, DIPSTICK ONLY
Bilirubin Urine: NEGATIVE
Glucose, UA: NEGATIVE mg/dL
Hgb urine dipstick: NEGATIVE
Ketones, ur: NEGATIVE mg/dL
Leukocytes,Ua: NEGATIVE
Nitrite: NEGATIVE
Protein, ur: 100 mg/dL — AB
Specific Gravity, Urine: 1.017 (ref 1.005–1.030)
pH: 5 (ref 5.0–8.0)

## 2023-03-10 LAB — COMPREHENSIVE METABOLIC PANEL
ALT: 40 U/L (ref 0–44)
AST: 55 U/L — ABNORMAL HIGH (ref 15–41)
Albumin: 3.3 g/dL — ABNORMAL LOW (ref 3.5–5.0)
Alkaline Phosphatase: 168 U/L — ABNORMAL HIGH (ref 38–126)
Anion gap: 9 (ref 5–15)
BUN: 12 mg/dL (ref 8–23)
CO2: 27 mmol/L (ref 22–32)
Calcium: 8.8 mg/dL — ABNORMAL LOW (ref 8.9–10.3)
Chloride: 98 mmol/L (ref 98–111)
Creatinine, Ser: 0.86 mg/dL (ref 0.44–1.00)
GFR, Estimated: >60 mL/min (ref >60)
Glucose, Bld: 181 mg/dL — ABNORMAL HIGH (ref 70–99)
Potassium: 3.1 mmol/L — ABNORMAL LOW (ref 3.5–5.1)
Sodium: 134 mmol/L — ABNORMAL LOW (ref 135–145)
Total Bilirubin: 1.1 mg/dL (ref 0.3–1.2)
Total Protein: 7.3 g/dL (ref 6.5–8.1)

## 2023-03-10 LAB — MAGNESIUM: Magnesium: 2 mg/dL (ref 1.7–2.4)

## 2023-03-10 MED ORDER — FLUOROURACIL CHEMO INJECTION 500 MG/10ML
270.0000 mg/m2 | Freq: Once | INTRAVENOUS | Status: AC
  Administered 2023-03-10: 500 mg via INTRAVENOUS
  Filled 2023-03-10: qty 10

## 2023-03-10 MED ORDER — DEXTROSE 5 % IV SOLN: Freq: Once | INTRAVENOUS | Status: DC

## 2023-03-10 MED ORDER — SODIUM CHLORIDE 0.9 % IV SOLN
10.0000 mg | Freq: Once | INTRAVENOUS | Status: AC
  Administered 2023-03-10: 10 mg via INTRAVENOUS
  Filled 2023-03-10: qty 10

## 2023-03-10 MED ORDER — OXALIPLATIN CHEMO INJECTION 100 MG/20ML
68.0000 mg/m2 | Freq: Once | INTRAVENOUS | Status: AC
  Administered 2023-03-10: 135 mg via INTRAVENOUS
  Filled 2023-03-10: qty 20

## 2023-03-10 MED ORDER — SODIUM CHLORIDE 0.9 % IV SOLN
5.0000 mg/kg | Freq: Once | INTRAVENOUS | Status: AC
  Administered 2023-03-10: 400 mg via INTRAVENOUS
  Filled 2023-03-10: qty 16

## 2023-03-10 MED ORDER — SODIUM CHLORIDE 0.9 % IV SOLN: Freq: Once | INTRAVENOUS | Status: AC

## 2023-03-10 MED ORDER — SODIUM CHLORIDE 0.9% FLUSH
10.0000 mL | INTRAVENOUS | Status: DC | PRN
  Administered 2023-03-10: 10 mL via INTRAVENOUS

## 2023-03-10 MED ORDER — DEXTROSE 5 % IV SOLN: Freq: Once | INTRAVENOUS | Status: AC

## 2023-03-10 MED ORDER — LEUCOVORIN CALCIUM INJECTION 350 MG
320.0000 mg/m2 | Freq: Once | INTRAVENOUS | Status: AC
  Administered 2023-03-10: 628 mg via INTRAVENOUS
  Filled 2023-03-10: qty 31.4

## 2023-03-10 MED ORDER — PALONOSETRON HCL INJECTION 0.25 MG/5ML
0.2500 mg | Freq: Once | INTRAVENOUS | Status: AC
  Administered 2023-03-10: 0.25 mg via INTRAVENOUS
  Filled 2023-03-10: qty 5

## 2023-03-10 MED ORDER — POTASSIUM CHLORIDE CRYS ER 20 MEQ PO TBCR
40.0000 meq | EXTENDED_RELEASE_TABLET | Freq: Once | ORAL | Status: AC
  Administered 2023-03-10: 40 meq via ORAL
  Filled 2023-03-10: qty 2

## 2023-03-10 MED ORDER — SODIUM CHLORIDE 0.9% FLUSH: 10.0000 mL | INTRAVENOUS | Status: DC | PRN

## 2023-03-10 MED ORDER — SODIUM CHLORIDE 0.9 % IV SOLN
3250.0000 mg | INTRAVENOUS | Status: DC
  Administered 2023-03-10: 3250 mg via INTRAVENOUS
  Filled 2023-03-10: qty 65

## 2023-03-10 NOTE — Progress Notes (Signed)
Patients port flushed without difficulty.  Good blood return noted with no bruising or swelling noted at site.  VSS. Patient remains accessed for treatment.  

## 2023-03-10 NOTE — Addendum Note (Signed)
Addended by: Toniann Fail B on: 03/10/2023 08:56 AM   Modules accepted: Orders

## 2023-03-10 NOTE — Progress Notes (Signed)
Potassium 3.1 for today's treatment.  Potassium 40 meq by mouth verbal order Dr. Ellin Saba.

## 2023-03-10 NOTE — Addendum Note (Signed)
Addended by: Toniann Fail B on: 03/10/2023 09:13 AM   Modules accepted: Orders

## 2023-03-12 ENCOUNTER — Inpatient Hospital Stay: Payer: No Typology Code available for payment source

## 2023-03-12 VITALS — BP 180/90 | HR 88 | Temp 98.2°F | Resp 20

## 2023-03-12 DIAGNOSIS — Z5111 Encounter for antineoplastic chemotherapy: Secondary | ICD-10-CM | POA: Diagnosis not present

## 2023-03-12 DIAGNOSIS — C189 Malignant neoplasm of colon, unspecified: Secondary | ICD-10-CM

## 2023-03-12 MED ORDER — SODIUM CHLORIDE 0.9% FLUSH
10.0000 mL | INTRAVENOUS | Status: DC | PRN
  Administered 2023-03-12: 10 mL

## 2023-03-12 MED ORDER — HEPARIN SOD (PORK) LOCK FLUSH 100 UNIT/ML IV SOLN
500.0000 [IU] | Freq: Once | INTRAVENOUS | Status: AC | PRN
  Administered 2023-03-12: 500 [IU]

## 2023-03-12 NOTE — Progress Notes (Signed)
Patient presents today for 5FU chemotherapy pump disconnection per provider's order. Vital signs stable and pt voiced no new complaints at this time. Port flushed easily without difficulty with 10 Ml of normal saline and 5 mL of heparin, good blood return noted. Needle removed intact and no bruising or swelling noted at the site.  Discharged from clinic ambulatory in stable condition. Alert and oriented x 3. F/U with [facility] as scheduled.

## 2023-03-12 NOTE — Patient Instructions (Signed)
[facility]  Discharge Instructions: Thank you for choosing [facility] to provide your oncology and hematology care.  If you have a lab appointment with the Cancer Center - please note that after April 8th, 2024, all labs will be drawn in the cancer center.  You do not have to check in or register with the main entrance as you have in the past but will complete your check-in in the cancer center.  Wear comfortable clothing and clothing appropriate for easy access to any Portacath or PICC line.   We strive to give you quality time with your provider. You may need to reschedule your appointment if you arrive late (15 or more minutes).  Arriving late affects you and other patients whose appointments are after yours.  Also, if you miss three or more appointments without notifying the office, you may be dismissed from the clinic at the provider's discretion.      For prescription refill requests, have your pharmacy contact our office and allow 72 hours for refills to be completed.    Today you received 5FU chemotherapy pump disconnection.   BELOW ARE SYMPTOMS THAT SHOULD BE REPORTED IMMEDIATELY: *FEVER GREATER THAN 100.4 F (38 C) OR HIGHER *CHILLS OR SWEATING *NAUSEA AND VOMITING THAT IS NOT CONTROLLED WITH YOUR NAUSEA MEDICATION *UNUSUAL SHORTNESS OF BREATH *UNUSUAL BRUISING OR BLEEDING *URINARY PROBLEMS (pain or burning when urinating, or frequent urination) *BOWEL PROBLEMS (unusual diarrhea, constipation, pain near the anus) TENDERNESS IN MOUTH AND THROAT WITH OR WITHOUT PRESENCE OF ULCERS (sore throat, sores in mouth, or a toothache) UNUSUAL RASH, SWELLING OR PAIN  UNUSUAL VAGINAL DISCHARGE OR ITCHING   Items with * indicate a potential emergency and should be followed up as soon as possible or go to the Emergency Department if any problems should occur.  Please show the CHEMOTHERAPY ALERT CARD or IMMUNOTHERAPY ALERT CARD at check-in to the Emergency  Department and triage nurse.  Should you have questions after your visit or need to cancel or reschedule your appointment, please contact [facility] CENTER AT [facility] [phone]  and follow the prompts.  Office hours are 8:00 a.m. to 4:30 p.m. Monday - Friday. Please note that voicemails left after 4:00 p.m. may not be returned until the following business day.  We are closed weekends and major holidays. You have access to a nurse at all times for urgent questions. Please call the main number to the clinic [phone] and follow the prompts.  For any non-urgent questions, you may also contact your provider using MyChart. We now offer e-Visits for anyone 19 and older to request care online for non-urgent symptoms. For details visit [email or web address].   Also download the MyChart app! Go to the app store, search "MyChart", open the app, select [place], and log in with your MyChart username and password.

## 2023-03-17 ENCOUNTER — Inpatient Hospital Stay: Payer: No Typology Code available for payment source

## 2023-03-19 ENCOUNTER — Inpatient Hospital Stay: Payer: No Typology Code available for payment source

## 2023-03-24 ENCOUNTER — Inpatient Hospital Stay: Payer: No Typology Code available for payment source

## 2023-03-24 VITALS — BP 155/78 | HR 87 | Temp 97.6°F | Resp 18 | Wt 185.0 lb

## 2023-03-24 DIAGNOSIS — C189 Malignant neoplasm of colon, unspecified: Secondary | ICD-10-CM

## 2023-03-24 DIAGNOSIS — Z5111 Encounter for antineoplastic chemotherapy: Secondary | ICD-10-CM | POA: Diagnosis not present

## 2023-03-24 LAB — COMPREHENSIVE METABOLIC PANEL
ALT: 36 U/L (ref 0–44)
AST: 54 U/L — ABNORMAL HIGH (ref 15–41)
Albumin: 3.2 g/dL — ABNORMAL LOW (ref 3.5–5.0)
Alkaline Phosphatase: 177 U/L — ABNORMAL HIGH (ref 38–126)
Anion gap: 15 (ref 5–15)
BUN: 14 mg/dL (ref 8–23)
CO2: 22 mmol/L (ref 22–32)
Calcium: 9.3 mg/dL (ref 8.9–10.3)
Chloride: 95 mmol/L — ABNORMAL LOW (ref 98–111)
Creatinine, Ser: 1.07 mg/dL — ABNORMAL HIGH (ref 0.44–1.00)
GFR, Estimated: 59 mL/min — ABNORMAL LOW (ref >60)
Glucose, Bld: 256 mg/dL — ABNORMAL HIGH (ref 70–99)
Potassium: 4 mmol/L (ref 3.5–5.1)
Sodium: 132 mmol/L — ABNORMAL LOW (ref 135–145)
Total Bilirubin: 1.2 mg/dL (ref 0.3–1.2)
Total Protein: 6.9 g/dL (ref 6.5–8.1)

## 2023-03-24 LAB — CBC WITH DIFFERENTIAL/PLATELET
Abs Immature Granulocytes: 0.02 10*3/uL (ref 0.00–0.07)
Basophils Absolute: 0 10*3/uL (ref 0.0–0.1)
Basophils Relative: 1 %
Eosinophils Absolute: 0.1 10*3/uL (ref 0.0–0.5)
Eosinophils Relative: 2 %
HCT: 42.3 % (ref 36.0–46.0)
Hemoglobin: 13.9 g/dL (ref 12.0–15.0)
Immature Granulocytes: 0 %
Lymphocytes Relative: 26 %
Lymphs Abs: 1.6 10*3/uL (ref 0.7–4.0)
MCH: 30.2 pg (ref 26.0–34.0)
MCHC: 32.9 g/dL (ref 30.0–36.0)
MCV: 92 fL (ref 80.0–100.0)
Monocytes Absolute: 0.7 10*3/uL (ref 0.1–1.0)
Monocytes Relative: 12 %
Neutro Abs: 3.6 10*3/uL (ref 1.7–7.7)
Neutrophils Relative %: 59 %
Platelets: 132 10*3/uL — ABNORMAL LOW (ref 150–400)
RBC: 4.6 MIL/uL (ref 3.87–5.11)
RDW: 16.2 % — ABNORMAL HIGH (ref 11.5–15.5)
WBC: 6 10*3/uL (ref 4.0–10.5)
nRBC: 0 % (ref 0.0–0.2)

## 2023-03-24 LAB — MAGNESIUM: Magnesium: 1.9 mg/dL (ref 1.7–2.4)

## 2023-03-24 MED ORDER — SODIUM CHLORIDE 0.9 % IV SOLN
10.0000 mg | Freq: Once | INTRAVENOUS | Status: AC
  Administered 2023-03-24: 10 mg via INTRAVENOUS
  Filled 2023-03-24: qty 10

## 2023-03-24 MED ORDER — DEXTROSE 5 % IV SOLN: Freq: Once | INTRAVENOUS | Status: AC

## 2023-03-24 MED ORDER — LEUCOVORIN CALCIUM INJECTION 350 MG
320.0000 mg/m2 | Freq: Once | INTRAVENOUS | Status: AC
  Administered 2023-03-24: 628 mg via INTRAVENOUS
  Filled 2023-03-24: qty 31.4

## 2023-03-24 MED ORDER — SODIUM CHLORIDE 0.9 % IV SOLN: Freq: Once | INTRAVENOUS | Status: AC

## 2023-03-24 MED ORDER — SODIUM CHLORIDE 0.9 % IV SOLN
5.0000 mg/kg | Freq: Once | INTRAVENOUS | Status: AC
  Administered 2023-03-24: 400 mg via INTRAVENOUS
  Filled 2023-03-24: qty 16

## 2023-03-24 MED ORDER — PALONOSETRON HCL INJECTION 0.25 MG/5ML
0.2500 mg | Freq: Once | INTRAVENOUS | Status: AC
  Administered 2023-03-24: 0.25 mg via INTRAVENOUS
  Filled 2023-03-24: qty 5

## 2023-03-24 MED ORDER — SODIUM CHLORIDE 0.9 % IV SOLN
3250.0000 mg | INTRAVENOUS | Status: DC
  Administered 2023-03-24: 3250 mg via INTRAVENOUS
  Filled 2023-03-24: qty 65

## 2023-03-24 MED ORDER — FLUOROURACIL CHEMO INJECTION 500 MG/10ML
270.0000 mg/m2 | Freq: Once | INTRAVENOUS | Status: AC
  Administered 2023-03-24: 500 mg via INTRAVENOUS
  Filled 2023-03-24: qty 10

## 2023-03-24 MED ORDER — OXALIPLATIN CHEMO INJECTION 100 MG/20ML
68.0000 mg/m2 | Freq: Once | INTRAVENOUS | Status: AC
  Administered 2023-03-24: 135 mg via INTRAVENOUS
  Filled 2023-03-24: qty 27

## 2023-03-24 NOTE — Patient Instructions (Signed)
[facility]  Discharge Instructions: Thank you for choosing [facility] to provide your oncology and hematology care.  If you have a lab appointment with the Cancer Center - please note that after April 8th, 2024, all labs will be drawn in the cancer center.  You do not have to check in or register with the main entrance as you have in the past but will complete your check-in in the cancer center.  Wear comfortable clothing and clothing appropriate for easy access to any Portacath or PICC line.   We strive to give you quality time with your provider. You may need to reschedule your appointment if you arrive late (15 or more minutes).  Arriving late affects you and other patients whose appointments are after yours.  Also, if you miss three or more appointments without notifying the office, you may be dismissed from the clinic at the provider's discretion.      For prescription refill requests, have your pharmacy contact our office and allow 72 hours for refills to be completed.    Today you received the following chemotherapy and/or immunotherapy agents oxaliplatin, leucovorin, fluorouracil   To help prevent nausea and vomiting after your treatment, we encourage you to take your nausea medication as directed.  BELOW ARE SYMPTOMS THAT SHOULD BE REPORTED IMMEDIATELY: *FEVER GREATER THAN 100.4 F (38 C) OR HIGHER *CHILLS OR SWEATING *NAUSEA AND VOMITING THAT IS NOT CONTROLLED WITH YOUR NAUSEA MEDICATION *UNUSUAL SHORTNESS OF BREATH *UNUSUAL BRUISING OR BLEEDING *URINARY PROBLEMS (pain or burning when urinating, or frequent urination) *BOWEL PROBLEMS (unusual diarrhea, constipation, pain near the anus) TENDERNESS IN MOUTH AND THROAT WITH OR WITHOUT PRESENCE OF ULCERS (sore throat, sores in mouth, or a toothache) UNUSUAL RASH, SWELLING OR PAIN  UNUSUAL VAGINAL DISCHARGE OR ITCHING   Items with * indicate a potential emergency and should be followed up as soon  as possible or go to the Emergency Department if any problems should occur.  Please show the CHEMOTHERAPY ALERT CARD or IMMUNOTHERAPY ALERT CARD at check-in to the Emergency Department and triage nurse.  Should you have questions after your visit or need to cancel or reschedule your appointment, please contact [facility]ENTER AT [facility] [phone]  and follow the prompts.  Office hours are 8:00 a.m. to 4:30 p.m. Monday - Friday. Please note that voicemails left after 4:00 p.m. may not be returned until the following business day.  We are closed weekends and major holidays. You have access to a nurse at all times for urgent questions. Please call the main number to the clinic [phone] and follow the prompts.  For any non-urgent questions, you may also contact your provider using MyChart. We now offer e-Visits for anyone 33 and older to request care online for non-urgent symptoms. For details visit [email or web address].   Also download the MyChart app! Go to the app store, search "MyChart", open the app, select [place], and log in with your MyChart username and password.

## 2023-03-24 NOTE — Progress Notes (Signed)
Labs reviewed today. They meet parameters for treatment. Patient reports no issues from Oxaliplatin since last tx. Will proceed as planned.   Treatment given per orders. Patient tolerated it well without problems. Vitals stable and discharged home from clinic ambulatory. Follow up as scheduled.

## 2023-03-26 ENCOUNTER — Inpatient Hospital Stay: Payer: No Typology Code available for payment source

## 2023-03-26 VITALS — BP 167/90 | HR 86 | Temp 97.2°F | Resp 18

## 2023-03-26 DIAGNOSIS — Z5111 Encounter for antineoplastic chemotherapy: Secondary | ICD-10-CM | POA: Diagnosis not present

## 2023-03-26 DIAGNOSIS — C189 Malignant neoplasm of colon, unspecified: Secondary | ICD-10-CM

## 2023-03-26 MED ORDER — HEPARIN SOD (PORK) LOCK FLUSH 100 UNIT/ML IV SOLN
500.0000 [IU] | Freq: Once | INTRAVENOUS | Status: AC | PRN
  Administered 2023-03-26: 500 [IU]

## 2023-03-26 MED ORDER — SODIUM CHLORIDE 0.9% FLUSH
10.0000 mL | INTRAVENOUS | Status: DC | PRN
  Administered 2023-03-26: 10 mL

## 2023-03-26 NOTE — Progress Notes (Signed)
Patient presents today for 5FU pump stop and disconnection after 46 hour continous infusion.   5FU pump deaccessed.  Patients port flushed without difficulty.  Good blood return noted with no bruising or swelling noted at site.  Needle removed intact.  Band aid applied.  VSS with discharge and left in satisfactory condition via wheelchair with no s/s of distress noted.    

## 2023-03-26 NOTE — Patient Instructions (Signed)
[facility]  Discharge Instructions: Thank you for choosing [facility] to provide your oncology and hematology care.  If you have a lab appointment with the Cancer Center - please note that after April 8th, 2024, all labs will be drawn in the cancer center.  You do not have to check in or register with the main entrance as you have in the past but will complete your check-in in the cancer center.  Wear comfortable clothing and clothing appropriate for easy access to any Portacath or PICC line.   We strive to give you quality time with your provider. You may need to reschedule your appointment if you arrive late (15 or more minutes).  Arriving late affects you and other patients whose appointments are after yours.  Also, if you miss three or more appointments without notifying the office, you may be dismissed from the clinic at the provider's discretion.      For prescription refill requests, have your pharmacy contact our office and allow 72 hours for refills to be completed.    Today you received the following chemotherapy and/or immunotherapy agents pump stop      To help prevent nausea and vomiting after your treatment, we encourage you to take your nausea medication as directed.  BELOW ARE SYMPTOMS THAT SHOULD BE REPORTED IMMEDIATELY: *FEVER GREATER THAN 100.4 F (38 C) OR HIGHER *CHILLS OR SWEATING *NAUSEA AND VOMITING THAT IS NOT CONTROLLED WITH YOUR NAUSEA MEDICATION *UNUSUAL SHORTNESS OF BREATH *UNUSUAL BRUISING OR BLEEDING *URINARY PROBLEMS (pain or burning when urinating, or frequent urination) *BOWEL PROBLEMS (unusual diarrhea, constipation, pain near the anus) TENDERNESS IN MOUTH AND THROAT WITH OR WITHOUT PRESENCE OF ULCERS (sore throat, sores in mouth, or a toothache) UNUSUAL RASH, SWELLING OR PAIN  UNUSUAL VAGINAL DISCHARGE OR ITCHING   Items with * indicate a potential emergency and should be followed up as soon as possible or go to the  Emergency Department if any problems should occur.  Please show the CHEMOTHERAPY ALERT CARD or IMMUNOTHERAPY ALERT CARD at check-in to the Emergency Department and triage nurse.  Should you have questions after your visit or need to cancel or reschedule your appointment, please contact [facility] AT [facility] [phone]  and follow the prompts.  Office hours are 8:00 a.m. to 4:30 p.m. Monday - Friday. Please note that voicemails left after 4:00 p.m. may not be returned until the following business day.  We are closed weekends and major holidays. You have access to a nurse at all times for urgent questions. Please call the main number to the clinic [phone] and follow the prompts.  For any non-urgent questions, you may also contact your provider using MyChart. We now offer e-Visits for anyone 55 and older to request care online for non-urgent symptoms. For details visit [email or web address].   Also download the MyChart app! Go to the app store, search "MyChart", open the app, select [place], and log in with your MyChart username and password.

## 2023-03-30 ENCOUNTER — Inpatient Hospital Stay: Payer: No Typology Code available for payment source | Admitting: Hematology

## 2023-03-30 ENCOUNTER — Inpatient Hospital Stay: Payer: No Typology Code available for payment source

## 2023-03-31 ENCOUNTER — Inpatient Hospital Stay: Payer: No Typology Code available for payment source | Admitting: Hematology

## 2023-03-31 ENCOUNTER — Inpatient Hospital Stay: Payer: No Typology Code available for payment source

## 2023-04-01 ENCOUNTER — Inpatient Hospital Stay: Payer: No Typology Code available for payment source

## 2023-04-02 ENCOUNTER — Inpatient Hospital Stay: Payer: No Typology Code available for payment source

## 2023-04-06 NOTE — Progress Notes (Signed)
[facility] [address], [place] [address]    Clinic Day:  04/07/2023  Referring physician: [name], MD  Patient Care Team: [name], MD as PCP - General (Hematology) Therese Sarah, RN as Oncology Nurse Navigator (Oncology)   ASSESSMENT & PLAN:   Assessment: 1.  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 -CT chest on 05/02/2020 with no evidence of pulmonary metastatic disease. -CTAP on 05/07/2020 with multiple lesions throughout the liver, largest in the right lobe measuring 3.9 cm, lateral segment left lobe measuring 2.6 cm and inferior right lobe confluent lesion 4.2 x 3.7 cm.  No lymphadenopathy. -CEA on 05/02/2020-60.2. -FOLFOX and bevacizumab started on 06/04/2020. 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 - FOLFOX with bevacizumab started on 03/10/2023   2.  Iron deficiency anemia: -Feraheme on 04/30/2020 and 05/06/2020.   3.  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 -Smoked for 3 to 4 years and quit. -Mother had cancer, type unknown.  Maternal uncle had lung cancer.  Maternal aunt had lung cancer.    Plan: 1.  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 - She reported slight worsening of tingling in the fingertips.  Otherwise denied any GI side effects.  She had cold sensitivity lasting about 3 to 4 days. - She reported her right leg sciatica is acting up since Thursday.  This is her main concern as it is limiting her activity. - Will give her Toradol 60 mg IM x 1 today. - Reviewed labs today: AST is 48 and improving.   Rest of the LFTs are normal.  CBC grossly normal with thrombocytopenia of 80. - Will proceed with cycle 3 of FOLFOX and bevacizumab today and cycle 4 in 2 weeks.  RTC 4 weeks for follow-up.  Will plan on repeating scans after cycle 6.   2.  Hypertension: - Continue Norvasc 10 mg daily and triamterene/HCTZ daily.  Blood pressure today is 170/80.   3.  Severe hypokalemia: - Continue potassium 40 mEq daily.  Potassium is normal today.   4.  Peripheral neuropathy: - Continue gabapentin 100 mg 3 times daily.  Slight worsening of tingling in the fingertips since oxaliplatin started.   5.  Anxiety: - Continue Klonopin 1 mg twice daily.  Continue Paxil 40 mg daily.  6.  Mucositis: - Continue lidocaine gel as needed.    No orders of the defined types were placed in this encounter.     I,Katie Daubenspeck,acting as a Neurosurgeon for [name], MD.,have documented all relevant documentation on the behalf of [name], MD,as directed by  [name], MD while in the presence of [name], MD.   I, [name] MD, have reviewed the above documentation for accuracy and completeness, and I agree with the above.   [name], MD   10/9/20246:12 PM  CHIEF COMPLAINT:   Diagnosis: metastatic colon cancer to liver    Cancer Staging  No matching staging information was found for the patient.    Prior Therapy: 1. Sigmoid colectomy and end colostomy on 04/29/2020  2. FOLFOX with bevacizumab, 06/04/20 - 11/06/20  Current Therapy:  Maintenance 5-FU and bevacizumab    HISTORY OF PRESENT ILLNESS:  Oncology History  Metastatic colon cancer to liver (HCC)  05/16/2020 Initial Diagnosis   Metastatic colon cancer to liver (HCC)   06/03/2020 Genetic Testing   Foundation One:     06/04/2020 - 02/25/2022 Chemotherapy   Patient is on Treatment Plan : COLORECTAL FOLFOX + Bevacizumab q14d     06/04/2020 -  Chemotherapy   Patient is on Treatment  Plan : COLORECTAL FOLFOX + Bevacizumab q14d        INTERVAL HISTORY:   Susan Martin is a 62 y.o. female presenting to clinic today for follow up of metastatic colon cancer to liver. She was last seen by me on 03/03/23.  Today, she states that she is doing well overall. Her appetite level is at 100%. Her energy level is at 65%.  PAST MEDICAL HISTORY:   Past Medical History: Past Medical History:  Diagnosis Date   Adenocarcinoma of colon metastatic to liver [facility]) onocology--- dr s. Ellin Saba   04-29-2020 emergerency surgery for perforated colon s/p sigmoid colectomy w/ colostomy; dx  Stage IV colon cancer mets to liver   Anxiety    Depression    Hypertension    followed by dr Emelda Fear and oncology  (05-27-2020 per pt does not have pcp yet)   IDA (iron deficiency anemia)    Pulmonary nodule, right     Surgical History: Past Surgical History:  Procedure Laterality Date   ABDOMINAL HYSTERECTOMY  03-31-2016   @[facility]    W/  BILATERAL SALPINOOPHORECTOMY    APPLICATION OF WOUND VAC N/A 04/29/2020   Procedure: APPLICATION OF WOUND VAC;  Surgeon: Rodman Pickle, MD;  Location: [facility] OR;  Service: General;  Laterality: N/A;   ENDOMETRIAL ABLATION     LAPAROTOMY N/A 04/29/2020   Procedure: EXPLORATORY LAPAROTOMY;  Surgeon: Rodman Pickle, MD;  Location: [facility] OR;  Service: General;  Laterality: N/A;   PARTIAL COLECTOMY N/A 04/29/2020   Procedure: PARTIAL COLECTOMY WITH END COLOSTOMY;  Surgeon: Sheliah Hatch De Blanch, MD;  Location: [facility] OR;  Service: General;  Laterality: N/A;   PORTACATH PLACEMENT Right 05/28/2020   Procedure: INSERTION PORT-A-CATH;  Surgeon: Kinsinger, De Blanch, MD;  Location[facility];  Service: General;  Laterality: Right;   SALPINGOOPHORECTOMY Left 03/31/2016   Procedure: LEFT SALPINGO OOPHORECTOMY WITH FROZEN SECTION,  ABDOMINAL HYSTERECTOMY WITH BILATERAL SALPINGO-OOPHORECTOMY;  Surgeon: Tilda Burrow, MD;  Location: [facility];  Service: Gynecology;   Laterality: Left;    Social History: Social History   Socioeconomic History   Marital status: Divorced    Spouse name: Not on file   Number of children: Not on file   Years of education: Not on file   Highest education level: Not on file  Occupational History   Not on file  Tobacco Use   Smoking status: Former    Current packs/day: 0.00    Average packs/day: 0.5 packs/day for 5.0 years (2.5 ttl pk-yrs)    Types: Cigarettes    Start date: 03/27/2010    Quit date: 03/28/2015    Years since quitting: 8.0   Smokeless tobacco: Never  Vaping Use   Vaping status: Never Used  Substance and Sexual Activity   Alcohol use: No   Drug use: Never   Sexual activity: Yes    Partners: Male    Birth control/protection: Surgical  Other Topics Concern   Not on file  Social History Narrative   Not on file   Social Determinants of Health   Financial Resource Strain: Low Risk  (11/01/2019)   Overall  Financial Resource Strain (CARDIA)    Difficulty of Paying Living Expenses: Not very hard  Food Insecurity: No Food Insecurity (11/01/2019)   Hunger Vital Sign    Worried About Running Out of Food in the Last Year: Never true    Ran Out of Food in the Last Year: Never true  Transportation Needs: No Transportation Needs (11/01/2019)   PRAPARE - Administrator, Civil Service (Medical): No    Lack of Transportation (Non-Medical): No  Physical Activity: Insufficiently Active (11/01/2019)   Exercise Vital Sign    Days of Exercise per Week: 2 days    Minutes of Exercise per Session: 30 min  Stress: Stress Concern Present (11/01/2019)   [employer] of Occupational Health - Occupational Stress Questionnaire    Feeling of Stress : To some extent  Social Connections: Moderately Integrated (11/01/2019)   Social Connection and Isolation Panel [NHANES]    Frequency of Communication with Friends and Family: More than three times a week    Frequency of Social Gatherings with Friends and Family:  Once a week    Attends Religious Services: More than 4 times per year    Active Member of [employer] or Organizations: No    Attends Banker Meetings: Never    Marital Status: Living with partner  Intimate Partner Violence: Not At Risk (11/01/2019)   Humiliation, Afraid, Rape, and Kick questionnaire    Fear of Current or Ex-Partner: No    Emotionally Abused: No    Physically Abused: No    Sexually Abused: No    Family History: Family History  Problem Relation Age of Onset   Hypertension Mother    Diabetes Mother    Cirrhosis Father     Current Medications:  Current Outpatient Medications:    amLODipine (NORVASC) 10 MG tablet, TAKE 1 TABLET BY MOUTH EVERY DAY, Disp: 90 tablet, Rfl: 2   BEVACIZUMAB IV, Inject 5 mg/kg into the vein every 14 (fourteen) days., Disp: , Rfl:    clonazePAM (KLONOPIN) 1 MG tablet, TAKE 1 TABLET (1 MG TOTAL) BY MOUTH IN THE MORNING, AT NOON, AND AT BEDTIME., Disp: 90 tablet, Rfl: 3   fluorouracil CALGB 28413 in sodium chloride 0.9 % 150 mL, Inject 2,400 mg/m2 into the vein over 48 hr., Disp: , Rfl:    gabapentin (NEURONTIN) 100 MG capsule, TAKE 1 CAPSULE (100 MG TOTAL) BY MOUTH THREE TIMES DAILY., Disp: 90 capsule, Rfl: 3   LEUCOVORIN CALCIUM IV, Inject 400 mg/m2 into the vein every 21 ( twenty-one) days., Disp: , Rfl:    lidocaine (XYLOCAINE) 2 % solution, Use as directed 15 mLs in the mouth or throat every 6 (six) hours as needed for mouth pain. Swish and spit/swallow every six hours as needed for mouth pain, Disp: 480 mL, Rfl: 3   lidocaine-prilocaine (EMLA) cream, Apply 1 Application topically as needed., Disp: 30 g, Rfl: 0   nystatin (MYCOSTATIN) 100000 UNIT/ML suspension, Take by mouth., Disp: , Rfl:    OXALIPLATIN IV, Inject into the vein every 14 (fourteen) days., Disp: , Rfl:    oxyCODONE (OXY IR/ROXICODONE) 5 MG immediate release tablet, Take 5 mg by mouth every 6 (six) hours as needed. for pain, Disp: , Rfl:    PARoxetine (PAXIL) 40 MG  tablet, Take 1 tablet (40 mg total) by mouth every morning., Disp: 90 tablet, Rfl: 3   polyethylene glycol (MIRALAX / GLYCOLAX) packet, Take 17 g by mouth every other day., Disp: , Rfl:    triamterene-hydrochlorothiazide (  MAXZIDE-25) 37.5-25 MG tablet, TAKE 1 TABLET BY MOUTH EVERY DAY, Disp: 90 tablet, Rfl: 1   potassium chloride (KLOR-CON M10) 10 MEQ tablet, Take 2 tablets (20 mEq total) by mouth daily., Disp: 180 tablet, Rfl: 3 No current facility-administered medications for this visit.  Facility-Administered Medications Ordered in Other Visits:    fluorouracil (ADRUCIL) 3,250 mg in sodium chloride 0.9 % 85 mL chemo infusion, 3,250 mg, Intravenous, 1 day or 1 dose, [name], MD, Infusion Verify at 04/07/23 1419   heparin lock flush 100 unit/mL, 500 Units, Intracatheter, Once PRN, [name], MD   sodium chloride flush (NS) 0.9 % injection 10 mL, 10 mL, Intracatheter, PRN, [name], MD   Allergies: No Known Allergies  REVIEW OF SYSTEMS:   Review of Systems  Constitutional:  Negative for chills, fatigue and fever.  HENT:   Negative for lump/mass, mouth sores, nosebleeds, sore throat and trouble swallowing.   Eyes:  Negative for eye problems.  Respiratory:  Negative for cough and shortness of breath.   Cardiovascular:  Negative for chest pain, leg swelling and palpitations.  Gastrointestinal:  Positive for diarrhea. Negative for abdominal pain, constipation, nausea and vomiting.  Genitourinary:  Negative for bladder incontinence, difficulty urinating, dysuria, frequency, hematuria and nocturia.   Musculoskeletal:  Negative for arthralgias, back pain, flank pain, myalgias and neck pain.  Skin:  Negative for itching and rash.  Neurological:  Negative for dizziness, headaches and numbness.  Hematological:  Does not bruise/bleed easily.  Psychiatric/Behavioral:  Positive for depression and sleep disturbance. Negative for suicidal ideas. The patient is  nervous/anxious.   All other systems reviewed and are negative.    VITALS:   There were no vitals taken for this visit.  Wt Readings from Last 3 Encounters:  04/07/23 186 lb 6.4 oz (84.6 kg)  03/24/23 185 lb (83.9 kg)  03/10/23 184 lb 3.2 oz (83.6 kg)    There is no height or weight on file to calculate BMI.  Performance status (ECOG): 1 - Symptomatic but completely ambulatory  PHYSICAL EXAM:   Physical Exam Vitals and nursing note reviewed. Exam conducted with a chaperone present.  Constitutional:      Appearance: Normal appearance.  Cardiovascular:     Rate and Rhythm: Normal rate and regular rhythm.     Pulses: Normal pulses.     Heart sounds: Normal heart sounds.  Pulmonary:     Effort: Pulmonary effort is normal.     Breath sounds: Normal breath sounds.  Abdominal:     Palpations: Abdomen is soft. There is no hepatomegaly, splenomegaly or mass.     Tenderness: There is no abdominal tenderness.  Musculoskeletal:     Right lower leg: No edema.     Left lower leg: No edema.  Lymphadenopathy:     Cervical: No cervical adenopathy.     Right cervical: No superficial, deep or posterior cervical adenopathy.    Left cervical: No superficial, deep or posterior cervical adenopathy.     Upper Body:     Right upper body: No supraclavicular or axillary adenopathy.     Left upper body: No supraclavicular or axillary adenopathy.  Neurological:     General: No focal deficit present.     Mental Status: She is alert and oriented to person, place, and time.  Psychiatric:        Mood and Affect: Mood normal.        Behavior: Behavior normal.     LABS:      Latest  Ref Rng & Units 04/07/2023    7:46 AM 03/24/2023    8:12 AM 03/10/2023    8:05 AM  CBC  WBC 4.0 - 10.5 K/uL 5.6  6.0  4.8   Hemoglobin 12.0 - 15.0 g/dL 16.1  09.6  04.5   Hematocrit 36.0 - 46.0 % 42.6  42.3  41.9   Platelets 150 - 400 K/uL 80  132  155       Latest Ref Rng & Units 04/07/2023    7:46 AM 03/24/2023     8:12 AM 03/10/2023    8:05 AM  CMP  Glucose 70 - 99 mg/dL 409  811  914   BUN 8 - 23 mg/dL 12  14  12    Creatinine 0.44 - 1.00 mg/dL 7.82  9.56  2.13   Sodium 135 - 145 mmol/L 133  132  134   Potassium 3.5 - 5.1 mmol/L 3.5  4.0  3.1   Chloride 98 - 111 mmol/L 94  95  98   CO2 22 - 32 mmol/L 26  22  27    Calcium 8.9 - 10.3 mg/dL 8.8  9.3  8.8   Total Protein 6.5 - 8.1 g/dL 7.5  6.9  7.3   Total Bilirubin 0.3 - 1.2 mg/dL 1.2  1.2  1.1   Alkaline Phos 38 - 126 U/L 174  177  168   AST 15 - 41 U/L 48  54  55   ALT 0 - 44 U/L 34  36  40      Lab Results  Component Value Date   CEA1 15.4 (H) 02/17/2023   /  CEA  Date Value Ref Range Status  02/17/2023 15.4 (H) 0.0 - 4.7 ng/mL Final    Comment:    (NOTE)                             Nonsmokers          <3.9                             Smokers             <5.6 Roche [employer]ostics Electrochemiluminescence Immunoassay (ECLIA) Values obtained with different assay methods or kits cannot be used interchangeably.  Results cannot be interpreted as absolute evidence of the presence or absence of malignant disease. Performed At: [facility] [address] [place], [place] [id number] Jolene Schimke MD [id number]    No results found for: "PSA1" No results found for: "CAN199" No results found for: "CAN125"  No results found for: "TOTALPROTELP", "ALBUMINELP", "A1GS", "A2GS", "BETS", "BETA2SER", "GAMS", "MSPIKE", "SPEI" Lab Results  Component Value Date   TIBC 248 (L) 04/29/2020   FERRITIN 57 04/29/2020   IRONPCTSAT 3 (L) 04/29/2020   No results found for: "LDH"   STUDIES:   No results found.

## 2023-04-07 ENCOUNTER — Inpatient Hospital Stay: Payer: No Typology Code available for payment source

## 2023-04-07 ENCOUNTER — Encounter: Payer: Self-pay | Admitting: Hematology

## 2023-04-07 ENCOUNTER — Other Ambulatory Visit: Payer: Self-pay

## 2023-04-07 ENCOUNTER — Inpatient Hospital Stay: Payer: No Typology Code available for payment source | Attending: Hematology | Admitting: Hematology

## 2023-04-07 VITALS — BP 160/84 | HR 84 | Temp 97.1°F | Resp 18

## 2023-04-07 DIAGNOSIS — Z801 Family history of malignant neoplasm of trachea, bronchus and lung: Secondary | ICD-10-CM | POA: Diagnosis not present

## 2023-04-07 DIAGNOSIS — Z933 Colostomy status: Secondary | ICD-10-CM | POA: Diagnosis not present

## 2023-04-07 DIAGNOSIS — C189 Malignant neoplasm of colon, unspecified: Secondary | ICD-10-CM

## 2023-04-07 DIAGNOSIS — E876 Hypokalemia: Secondary | ICD-10-CM | POA: Diagnosis not present

## 2023-04-07 DIAGNOSIS — Z87891 Personal history of nicotine dependence: Secondary | ICD-10-CM | POA: Insufficient documentation

## 2023-04-07 DIAGNOSIS — C187 Malignant neoplasm of sigmoid colon: Secondary | ICD-10-CM | POA: Diagnosis not present

## 2023-04-07 DIAGNOSIS — K1230 Oral mucositis (ulcerative), unspecified: Secondary | ICD-10-CM | POA: Diagnosis not present

## 2023-04-07 DIAGNOSIS — C787 Secondary malignant neoplasm of liver and intrahepatic bile duct: Secondary | ICD-10-CM | POA: Diagnosis not present

## 2023-04-07 DIAGNOSIS — G629 Polyneuropathy, unspecified: Secondary | ICD-10-CM | POA: Diagnosis not present

## 2023-04-07 DIAGNOSIS — Z5111 Encounter for antineoplastic chemotherapy: Secondary | ICD-10-CM | POA: Insufficient documentation

## 2023-04-07 DIAGNOSIS — I10 Essential (primary) hypertension: Secondary | ICD-10-CM | POA: Insufficient documentation

## 2023-04-07 DIAGNOSIS — D509 Iron deficiency anemia, unspecified: Secondary | ICD-10-CM | POA: Diagnosis not present

## 2023-04-07 DIAGNOSIS — Z79899 Other long term (current) drug therapy: Secondary | ICD-10-CM | POA: Diagnosis not present

## 2023-04-07 DIAGNOSIS — D696 Thrombocytopenia, unspecified: Secondary | ICD-10-CM | POA: Diagnosis not present

## 2023-04-07 LAB — MAGNESIUM: Magnesium: 2 mg/dL (ref 1.7–2.4)

## 2023-04-07 LAB — COMPREHENSIVE METABOLIC PANEL
ALT: 34 U/L (ref 0–44)
AST: 48 U/L — ABNORMAL HIGH (ref 15–41)
Albumin: 3.2 g/dL — ABNORMAL LOW (ref 3.5–5.0)
Alkaline Phosphatase: 174 U/L — ABNORMAL HIGH (ref 38–126)
Anion gap: 13 (ref 5–15)
BUN: 12 mg/dL (ref 8–23)
CO2: 26 mmol/L (ref 22–32)
Calcium: 8.8 mg/dL — ABNORMAL LOW (ref 8.9–10.3)
Chloride: 94 mmol/L — ABNORMAL LOW (ref 98–111)
Creatinine, Ser: 1.01 mg/dL — ABNORMAL HIGH (ref 0.44–1.00)
GFR, Estimated: >60 mL/min (ref >60)
Glucose, Bld: 273 mg/dL — ABNORMAL HIGH (ref 70–99)
Potassium: 3.5 mmol/L (ref 3.5–5.1)
Sodium: 133 mmol/L — ABNORMAL LOW (ref 135–145)
Total Bilirubin: 1.2 mg/dL (ref 0.3–1.2)
Total Protein: 7.5 g/dL (ref 6.5–8.1)

## 2023-04-07 LAB — CBC WITH DIFFERENTIAL/PLATELET
Abs Immature Granulocytes: 0.01 10*3/uL (ref 0.00–0.07)
Basophils Absolute: 0.1 10*3/uL (ref 0.0–0.1)
Basophils Relative: 1 %
Eosinophils Absolute: 0.1 10*3/uL (ref 0.0–0.5)
Eosinophils Relative: 2 %
HCT: 42.6 % (ref 36.0–46.0)
Hemoglobin: 13.7 g/dL (ref 12.0–15.0)
Immature Granulocytes: 0 %
Lymphocytes Relative: 20 %
Lymphs Abs: 1.1 10*3/uL (ref 0.7–4.0)
MCH: 29.7 pg (ref 26.0–34.0)
MCHC: 32.2 g/dL (ref 30.0–36.0)
MCV: 92.4 fL (ref 80.0–100.0)
Monocytes Absolute: 0.8 10*3/uL (ref 0.1–1.0)
Monocytes Relative: 14 %
Neutro Abs: 3.5 10*3/uL (ref 1.7–7.7)
Neutrophils Relative %: 63 %
Platelets: 80 10*3/uL — ABNORMAL LOW (ref 150–400)
RBC: 4.61 MIL/uL (ref 3.87–5.11)
RDW: 16.7 % — ABNORMAL HIGH (ref 11.5–15.5)
WBC: 5.6 10*3/uL (ref 4.0–10.5)
nRBC: 0 % (ref 0.0–0.2)

## 2023-04-07 MED ORDER — SODIUM CHLORIDE 0.9 % IV SOLN
5.0000 mg/kg | Freq: Once | INTRAVENOUS | Status: AC
  Administered 2023-04-07: 400 mg via INTRAVENOUS
  Filled 2023-04-07: qty 16

## 2023-04-07 MED ORDER — OXALIPLATIN CHEMO INJECTION 100 MG/20ML
68.0000 mg/m2 | Freq: Once | INTRAVENOUS | Status: AC
  Administered 2023-04-07: 135 mg via INTRAVENOUS
  Filled 2023-04-07: qty 20

## 2023-04-07 MED ORDER — SODIUM CHLORIDE 0.9 % IV SOLN
3250.0000 mg | INTRAVENOUS | Status: DC
  Administered 2023-04-07: 3250 mg via INTRAVENOUS
  Filled 2023-04-07: qty 65

## 2023-04-07 MED ORDER — LEUCOVORIN CALCIUM INJECTION 350 MG
320.0000 mg/m2 | Freq: Once | INTRAVENOUS | Status: AC
  Administered 2023-04-07: 628 mg via INTRAVENOUS
  Filled 2023-04-07: qty 31.4

## 2023-04-07 MED ORDER — SODIUM CHLORIDE 0.9% FLUSH: 10.0000 mL | INTRAVENOUS | Status: DC | PRN

## 2023-04-07 MED ORDER — SODIUM CHLORIDE 0.9 % IV SOLN: Freq: Once | INTRAVENOUS | Status: AC

## 2023-04-07 MED ORDER — HEPARIN SOD (PORK) LOCK FLUSH 100 UNIT/ML IV SOLN: 500.0000 [IU] | Freq: Once | INTRAVENOUS | Status: DC | PRN

## 2023-04-07 MED ORDER — FLUOROURACIL CHEMO INJECTION 500 MG/10ML
270.0000 mg/m2 | Freq: Once | INTRAVENOUS | Status: AC
  Administered 2023-04-07: 500 mg via INTRAVENOUS
  Filled 2023-04-07: qty 10

## 2023-04-07 MED ORDER — SODIUM CHLORIDE 0.9 % IV SOLN
10.0000 mg | Freq: Once | INTRAVENOUS | Status: AC
  Administered 2023-04-07: 10 mg via INTRAVENOUS
  Filled 2023-04-07: qty 10

## 2023-04-07 MED ORDER — DEXTROSE 5 % IV SOLN: Freq: Once | INTRAVENOUS | Status: AC

## 2023-04-07 MED ORDER — KETOROLAC TROMETHAMINE 60 MG/2ML IM SOLN
60.0000 mg | Freq: Once | INTRAMUSCULAR | Status: AC
  Administered 2023-04-07: 60 mg via INTRAMUSCULAR
  Filled 2023-04-07: qty 2

## 2023-04-07 MED ORDER — PALONOSETRON HCL INJECTION 0.25 MG/5ML
0.2500 mg | Freq: Once | INTRAVENOUS | Status: AC
  Administered 2023-04-07: 0.25 mg via INTRAVENOUS
  Filled 2023-04-07: qty 5

## 2023-04-07 MED ORDER — POTASSIUM CHLORIDE CRYS ER 10 MEQ PO TBCR: 20.0000 meq | EXTENDED_RELEASE_TABLET | Freq: Every day | ORAL | 3 refills | Status: DC

## 2023-04-07 NOTE — Progress Notes (Signed)
Continue infusional 5FU at 1700mg /m2. Total dose 3250mg  per MD.  Richardean Sale, RPH, BCPS, BCOP 04/07/2023 10:05 AM

## 2023-04-07 NOTE — Progress Notes (Signed)
Patient has been examined by Dr. Ellin Saba. Vital signs and labs have been reviewed by MD - ANC, Creatinine, LFTs, hemoglobin, and platelets (80) are within treatment parameters per M.D. - pt may proceed with treatment.  Infusional 5FU dose reduced per MD. Primary RN and pharmacy notified.

## 2023-04-07 NOTE — Patient Instructions (Signed)
[facility]  Discharge Instructions: Thank you for choosing [facility] to provide your oncology and hematology care.  If you have a lab appointment with the Cancer Center - please note that after April 8th, 2024, all labs will be drawn in the cancer center.  You do not have to check in or register with the main entrance as you have in the past but will complete your check-in in the cancer center.  Wear comfortable clothing and clothing appropriate for easy access to any Portacath or PICC line.   We strive to give you quality time with your provider. You may need to reschedule your appointment if you arrive late (15 or more minutes).  Arriving late affects you and other patients whose appointments are after yours.  Also, if you miss three or more appointments without notifying the office, you may be dismissed from the clinic at the provider's discretion.      For prescription refill requests, have your pharmacy contact our office and allow 72 hours for refills to be completed.    Today you received the following chemotherapy and/or immunotherapy agents Oxaliplatin, leucovorin, adrucil.    To help prevent nausea and vomiting after your treatment, we encourage you to take your nausea medication as directed.  BELOW ARE SYMPTOMS THAT SHOULD BE REPORTED IMMEDIATELY: *FEVER GREATER THAN 100.4 F (38 C) OR HIGHER *CHILLS OR SWEATING *NAUSEA AND VOMITING THAT IS NOT CONTROLLED WITH YOUR NAUSEA MEDICATION *UNUSUAL SHORTNESS OF BREATH *UNUSUAL BRUISING OR BLEEDING *URINARY PROBLEMS (pain or burning when urinating, or frequent urination) *BOWEL PROBLEMS (unusual diarrhea, constipation, pain near the anus) TENDERNESS IN MOUTH AND THROAT WITH OR WITHOUT PRESENCE OF ULCERS (sore throat, sores in mouth, or a toothache) UNUSUAL RASH, SWELLING OR PAIN  UNUSUAL VAGINAL DISCHARGE OR ITCHING   Items with * indicate a potential emergency and should be followed up as soon as  possible or go to the Emergency Department if any problems should occur.  Please show the CHEMOTHERAPY ALERT CARD or IMMUNOTHERAPY ALERT CARD at check-in to the Emergency Department and triage nurse.  Should you have questions after your visit or need to cancel or reschedule your appointment, please contact [facility] [phone]  and follow the prompts.  Office hours are 8:00 a.m. to 4:30 p.m. Monday - Friday. Please note that voicemails left after 4:00 p.m. may not be returned until the following business day.  We are closed weekends and major holidays. You have access to a nurse at all times for urgent questions. Please call the main number to the clinic [phone] and follow the prompts.  For any non-urgent questions, you may also contact your provider using MyChart. We now offer e-Visits for anyone 65 and older to request care online for non-urgent symptoms. For details visit [email or web address].   Also download the MyChart app! Go to the app store, search "MyChart", open the app, select , and log in with your MyChart username and password.

## 2023-04-07 NOTE — Patient Instructions (Addendum)
[facility] at [facility] Discharge Instructions   You were seen and examined today by Dr. Ellin Saba.  He reviewed the results of your lab work which are normal/stable.   We will proceed with your treatment today.   We will give you a shot for your sciatic pain called Toradol.   Return as scheduled.    Thank you for choosing [facility] at [facility] to provide your oncology and hematology care.  To afford each patient quality time with our provider, please arrive at least 15 minutes before your scheduled appointment time.   If you have a lab appointment with the Cancer Center please come in thru the Main Entrance and check in at the main information desk.  You need to re-schedule your appointment should you arrive 10 or more minutes late.  We strive to give you quality time with our providers, and arriving late affects you and other patients whose appointments are after yours.  Also, if you no show three or more times for appointments you may be dismissed from the clinic at the providers discretion.     Again, thank you for choosing [facility].  Our hope is that these requests will decrease the amount of time that you wait before being seen by our physicians.       _____________________________________________________________  Should you have questions after your visit to [facility], please contact our office at [phone] and follow the prompts.  Our office hours are 8:00 a.m. and 4:30 p.m. Monday - Friday.  Please note that voicemails left after 4:00 p.m. may not be returned until the following business day.  We are closed weekends and major holidays.  You do have access to a nurse 24-7, just call the main number to the clinic [phone] and do not press any options, hold on the line and a nurse will answer the phone.    For prescription refill requests, have your pharmacy contact our office and allow 72  hours.    Due to Covid, you will need to wear a mask upon entering the hospital. If you do not have a mask, a mask will be given to you at the Main Entrance upon arrival. For doctor visits, patients may have 1 support person age 52 or older with them. For treatment visits, patients can not have anyone with them due to social distancing guidelines and our immunocompromised population.

## 2023-04-07 NOTE — Progress Notes (Signed)
Labs reviewed at office visit today with MD. Rip Harbour to treat per MD.   Treatment given per orders. Patient tolerated it well without problems. Vitals stable and discharged home from clinic ambulatory. Follow up as scheduled.

## 2023-04-08 ENCOUNTER — Other Ambulatory Visit: Payer: Self-pay

## 2023-04-09 ENCOUNTER — Inpatient Hospital Stay: Payer: No Typology Code available for payment source

## 2023-04-09 VITALS — BP 162/80 | HR 87 | Temp 96.7°F

## 2023-04-09 DIAGNOSIS — C787 Secondary malignant neoplasm of liver and intrahepatic bile duct: Secondary | ICD-10-CM

## 2023-04-09 DIAGNOSIS — Z5111 Encounter for antineoplastic chemotherapy: Secondary | ICD-10-CM | POA: Diagnosis not present

## 2023-04-09 MED ORDER — HEPARIN SOD (PORK) LOCK FLUSH 100 UNIT/ML IV SOLN
500.0000 [IU] | Freq: Once | INTRAVENOUS | Status: AC | PRN
  Administered 2023-04-09: 500 [IU]

## 2023-04-09 MED ORDER — SODIUM CHLORIDE 0.9% FLUSH
10.0000 mL | INTRAVENOUS | Status: DC | PRN
  Administered 2023-04-09: 10 mL

## 2023-04-09 NOTE — Patient Instructions (Signed)
[facility]  Discharge Instructions: Thank you for choosing [facility] to provide your oncology and hematology care.  If you have a lab appointment with the Cancer Center - please note that after April 8th, 2024, all labs will be drawn in the cancer center.  You do not have to check in or register with the main entrance as you have in the past but will complete your check-in in the cancer center.  Wear comfortable clothing and clothing appropriate for easy access to any Portacath or PICC line.   We strive to give you quality time with your provider. You may need to reschedule your appointment if you arrive late (15 or more minutes).  Arriving late affects you and other patients whose appointments are after yours.  Also, if you miss three or more appointments without notifying the office, you may be dismissed from the clinic at the provider's discretion.      For prescription refill requests, have your pharmacy contact our office and allow 72 hours for refills to be completed.    Today you received the following chemotherapy and/or immunotherapy agents 5FU pump stop      To help prevent nausea and vomiting after your treatment, we encourage you to take your nausea medication as directed.  BELOW ARE SYMPTOMS THAT SHOULD BE REPORTED IMMEDIATELY: *FEVER GREATER THAN 100.4 F (38 C) OR HIGHER *CHILLS OR SWEATING *NAUSEA AND VOMITING THAT IS NOT CONTROLLED WITH YOUR NAUSEA MEDICATION *UNUSUAL SHORTNESS OF BREATH *UNUSUAL BRUISING OR BLEEDING *URINARY PROBLEMS (pain or burning when urinating, or frequent urination) *BOWEL PROBLEMS (unusual diarrhea, constipation, pain near the anus) TENDERNESS IN MOUTH AND THROAT WITH OR WITHOUT PRESENCE OF ULCERS (sore throat, sores in mouth, or a toothache) UNUSUAL RASH, SWELLING OR PAIN  UNUSUAL VAGINAL DISCHARGE OR ITCHING   Items with * indicate a potential emergency and should be followed up as soon as possible or go to  the Emergency Department if any problems should occur.  Please show the CHEMOTHERAPY ALERT CARD or IMMUNOTHERAPY ALERT CARD at check-in to the Emergency Department and triage nurse.  Should you have questions after your visit or need to cancel or reschedule your appointment, please contact [facility] AT [facility] [phone]  and follow the prompts.  Office hours are 8:00 a.m. to 4:30 p.m. Monday - Friday. Please note that voicemails left after 4:00 p.m. may not be returned until the following business day.  We are closed weekends and major holidays. You have access to a nurse at all times for urgent questions. Please call the main number to the clinic [phone] and follow the prompts.  For any non-urgent questions, you may also contact your provider using MyChart. We now offer e-Visits for anyone 45 and older to request care online for non-urgent symptoms. For details visit [email or web address].   Also download the MyChart app! Go to the app store, search "MyChart", open the app, select [place], and log in with your MyChart username and password.

## 2023-04-09 NOTE — Progress Notes (Signed)
Patient presents today for 5FU pump stop and disconnection after 46 hour continous infusion.   5FU pump deaccessed.  Patients port flushed without difficulty.  Good blood return noted with no bruising or swelling noted at site.  Needle removed intact.  Band aid applied.  VSS with discharge and left in satisfactory condition ambulatory with no s/s of distress noted.    ?

## 2023-04-12 ENCOUNTER — Other Ambulatory Visit: Payer: Self-pay

## 2023-04-12 DIAGNOSIS — C189 Malignant neoplasm of colon, unspecified: Secondary | ICD-10-CM

## 2023-04-14 ENCOUNTER — Ambulatory Visit: Payer: No Typology Code available for payment source

## 2023-04-14 ENCOUNTER — Ambulatory Visit: Payer: No Typology Code available for payment source | Admitting: Hematology

## 2023-04-14 ENCOUNTER — Other Ambulatory Visit: Payer: No Typology Code available for payment source

## 2023-04-16 ENCOUNTER — Other Ambulatory Visit: Payer: Self-pay | Admitting: Hematology

## 2023-04-19 ENCOUNTER — Encounter: Payer: Self-pay | Admitting: Hematology

## 2023-04-21 ENCOUNTER — Inpatient Hospital Stay: Payer: No Typology Code available for payment source

## 2023-04-21 DIAGNOSIS — C189 Malignant neoplasm of colon, unspecified: Secondary | ICD-10-CM

## 2023-04-21 DIAGNOSIS — Z5111 Encounter for antineoplastic chemotherapy: Secondary | ICD-10-CM | POA: Diagnosis not present

## 2023-04-21 LAB — CBC WITH DIFFERENTIAL/PLATELET
Abs Immature Granulocytes: 0.01 10*3/uL (ref 0.00–0.07)
Basophils Absolute: 0 10*3/uL (ref 0.0–0.1)
Basophils Relative: 1 %
Eosinophils Absolute: 0.1 10*3/uL (ref 0.0–0.5)
Eosinophils Relative: 3 %
HCT: 42.7 % (ref 36.0–46.0)
Hemoglobin: 14.1 g/dL (ref 12.0–15.0)
Immature Granulocytes: 0 %
Lymphocytes Relative: 24 %
Lymphs Abs: 1.2 10*3/uL (ref 0.7–4.0)
MCH: 30.1 pg (ref 26.0–34.0)
MCHC: 33 g/dL (ref 30.0–36.0)
MCV: 91.2 fL (ref 80.0–100.0)
Monocytes Absolute: 0.6 10*3/uL (ref 0.1–1.0)
Monocytes Relative: 12 %
Neutro Abs: 2.9 10*3/uL (ref 1.7–7.7)
Neutrophils Relative %: 60 %
Platelets: 96 10*3/uL — ABNORMAL LOW (ref 150–400)
RBC: 4.68 MIL/uL (ref 3.87–5.11)
RDW: 17.7 % — ABNORMAL HIGH (ref 11.5–15.5)
WBC: 4.8 10*3/uL (ref 4.0–10.5)
nRBC: 0 % (ref 0.0–0.2)

## 2023-04-21 LAB — URINALYSIS, DIPSTICK ONLY
Bilirubin Urine: NEGATIVE
Glucose, UA: 50 mg/dL — AB
Hgb urine dipstick: NEGATIVE
Ketones, ur: NEGATIVE mg/dL
Leukocytes,Ua: NEGATIVE
Nitrite: NEGATIVE
Protein, ur: >=300 mg/dL — AB
Specific Gravity, Urine: 1.016 (ref 1.005–1.030)
pH: 5 (ref 5.0–8.0)

## 2023-04-21 LAB — COMPREHENSIVE METABOLIC PANEL
ALT: 28 U/L (ref 0–44)
AST: 39 U/L (ref 15–41)
Albumin: 3.1 g/dL — ABNORMAL LOW (ref 3.5–5.0)
Alkaline Phosphatase: 148 U/L — ABNORMAL HIGH (ref 38–126)
Anion gap: 12 (ref 5–15)
BUN: 16 mg/dL (ref 8–23)
CO2: 23 mmol/L (ref 22–32)
Calcium: 9 mg/dL (ref 8.9–10.3)
Chloride: 96 mmol/L — ABNORMAL LOW (ref 98–111)
Creatinine, Ser: 1.01 mg/dL — ABNORMAL HIGH (ref 0.44–1.00)
GFR, Estimated: >60 mL/min (ref >60)
Glucose, Bld: 274 mg/dL — ABNORMAL HIGH (ref 70–99)
Potassium: 3.9 mmol/L (ref 3.5–5.1)
Sodium: 131 mmol/L — ABNORMAL LOW (ref 135–145)
Total Bilirubin: 1.2 mg/dL (ref 0.3–1.2)
Total Protein: 7.5 g/dL (ref 6.5–8.1)

## 2023-04-21 LAB — MAGNESIUM: Magnesium: 1.8 mg/dL (ref 1.7–2.4)

## 2023-04-21 MED ORDER — SODIUM CHLORIDE 0.9 % IV SOLN
10.0000 mg | Freq: Once | INTRAVENOUS | Status: DC
Start: 2023-04-21 — End: 2023-04-21

## 2023-04-21 MED ORDER — SODIUM CHLORIDE 0.9% FLUSH
10.0000 mL | Freq: Once | INTRAVENOUS | Status: AC
  Administered 2023-04-21: 10 mL via INTRAVENOUS

## 2023-04-21 MED ORDER — DEXTROSE 5 % IV SOLN: Freq: Once | INTRAVENOUS | Status: AC

## 2023-04-21 MED ORDER — SODIUM CHLORIDE 0.9 % IV SOLN
3250.0000 mg | INTRAVENOUS | Status: DC
  Administered 2023-04-21: 3250 mg via INTRAVENOUS
  Filled 2023-04-21: qty 65

## 2023-04-21 MED ORDER — PALONOSETRON HCL INJECTION 0.25 MG/5ML
0.2500 mg | Freq: Once | INTRAVENOUS | Status: AC
  Administered 2023-04-21: 0.25 mg via INTRAVENOUS
  Filled 2023-04-21: qty 5

## 2023-04-21 MED ORDER — DEXTROSE 5 % IV SOLN
68.0000 mg/m2 | Freq: Once | INTRAVENOUS | Status: AC
  Administered 2023-04-21: 135 mg via INTRAVENOUS
  Filled 2023-04-21: qty 27

## 2023-04-21 MED ORDER — DEXAMETHASONE SODIUM PHOSPHATE 10 MG/ML IJ SOLN
10.0000 mg | Freq: Once | INTRAMUSCULAR | Status: AC
Start: 2023-04-21 — End: 2023-04-21
  Administered 2023-04-21: 10 mg via INTRAVENOUS
  Filled 2023-04-21: qty 1

## 2023-04-21 MED ORDER — DEXTROSE 5 % IV SOLN
320.0000 mg/m2 | Freq: Once | INTRAVENOUS | Status: AC
  Administered 2023-04-21: 628 mg via INTRAVENOUS
  Filled 2023-04-21: qty 31.4

## 2023-04-21 MED ORDER — INSULIN ASPART 100 UNIT/ML IJ SOLN
10.0000 [IU] | Freq: Once | INTRAMUSCULAR | Status: AC
  Administered 2023-04-21: 10 [IU] via SUBCUTANEOUS
  Filled 2023-04-21: qty 0.1

## 2023-04-21 MED ORDER — FLUOROURACIL CHEMO INJECTION 500 MG/10ML
270.0000 mg/m2 | Freq: Once | INTRAVENOUS | Status: AC
  Administered 2023-04-21: 500 mg via INTRAVENOUS
  Filled 2023-04-21: qty 10

## 2023-04-21 NOTE — Progress Notes (Signed)
Pt.'s glucose 274. 10 units of Novolog given per standing orders.  Urine protein >300 and PLT-96 MD and treatment team made aware. Per MD to hold avastin today but continue with Folfox treatment today. Pt updated and all questions answered at this time.  Patient tolerated chemotherapy with no complaints voiced.  Side effects with management reviewed with understanding verbalized.  Port site clean and dry with no bruising or swelling noted at site.  Good blood return noted before and after administration of chemotherapy.  5FU pump started for home use, pt due to return to clinic 04/23/2023 . Patient left in satisfactory condition with VSS and no s/s of distress noted. All follow ups as scheduled.   Anastasios Melander [employer]

## 2023-04-21 NOTE — Patient Instructions (Signed)
[facility]  Discharge Instructions: Thank you for choosing [facility] to provide your oncology and hematology care.  If you have a lab appointment with the Cancer Center - please note that after April 8th, 2024, all labs will be drawn in the cancer center.  You do not have to check in or register with the main entrance as you have in the past but will complete your check-in in the cancer center.  Wear comfortable clothing and clothing appropriate for easy access to any Portacath or PICC line.   We strive to give you quality time with your provider. You may need to reschedule your appointment if you arrive late (15 or more minutes).  Arriving late affects you and other patients whose appointments are after yours.  Also, if you miss three or more appointments without notifying the office, you may be dismissed from the clinic at the provider's discretion.      For prescription refill requests, have your pharmacy contact our office and allow 72 hours for refills to be completed.    Today you received the following chemotherapy and/or immunotherapy agents oxalplatin, leucovorin, adrucil    To help prevent nausea and vomiting after your treatment, we encourage you to take your nausea medication as directed.  BELOW ARE SYMPTOMS THAT SHOULD BE REPORTED IMMEDIATELY: *FEVER GREATER THAN 100.4 F (38 C) OR HIGHER *CHILLS OR SWEATING *NAUSEA AND VOMITING THAT IS NOT CONTROLLED WITH YOUR NAUSEA MEDICATION *UNUSUAL SHORTNESS OF BREATH *UNUSUAL BRUISING OR BLEEDING *URINARY PROBLEMS (pain or burning when urinating, or frequent urination) *BOWEL PROBLEMS (unusual diarrhea, constipation, pain near the anus) TENDERNESS IN MOUTH AND THROAT WITH OR WITHOUT PRESENCE OF ULCERS (sore throat, sores in mouth, or a toothache) UNUSUAL RASH, SWELLING OR PAIN  UNUSUAL VAGINAL DISCHARGE OR ITCHING   Items with * indicate a potential emergency and should be followed up as soon as  possible or go to the Emergency Department if any problems should occur.  Please show the CHEMOTHERAPY ALERT CARD or IMMUNOTHERAPY ALERT CARD at check-in to the Emergency Department and triage nurse.  Should you have questions after your visit or need to cancel or reschedule your appointment, please contact [facility] AT [facility] [phone]  and follow the prompts.  Office hours are 8:00 a.m. to 4:30 p.m. Monday - Friday. Please note that voicemails left after 4:00 p.m. may not be returned until the following business day.  We are closed weekends and major holidays. You have access to a nurse at all times for urgent questions. Please call the main number to the clinic [phone] and follow the prompts.  For any non-urgent questions, you may also contact your provider using MyChart. We now offer e-Visits for anyone 41 and older to request care online for non-urgent symptoms. For details visit [email or web address].   Also download the MyChart app! Go to the app store, search "MyChart", open the app, select [place], and log in with your MyChart username and password.

## 2023-04-22 ENCOUNTER — Other Ambulatory Visit: Payer: Self-pay

## 2023-04-23 ENCOUNTER — Inpatient Hospital Stay: Payer: No Typology Code available for payment source

## 2023-04-23 VITALS — BP 165/84 | HR 81 | Temp 98.5°F | Resp 18

## 2023-04-23 DIAGNOSIS — Z5111 Encounter for antineoplastic chemotherapy: Secondary | ICD-10-CM | POA: Diagnosis not present

## 2023-04-23 DIAGNOSIS — C787 Secondary malignant neoplasm of liver and intrahepatic bile duct: Secondary | ICD-10-CM

## 2023-04-23 MED ORDER — HEPARIN SOD (PORK) LOCK FLUSH 100 UNIT/ML IV SOLN
500.0000 [IU] | Freq: Once | INTRAVENOUS | Status: AC | PRN
  Administered 2023-04-23: 500 [IU]

## 2023-04-23 MED ORDER — SODIUM CHLORIDE 0.9% FLUSH
10.0000 mL | INTRAVENOUS | Status: DC | PRN
Start: 2023-04-23 — End: 2023-04-23
  Administered 2023-04-23: 10 mL

## 2023-04-23 NOTE — Progress Notes (Signed)
Chemo pump disconnected. Port flushed with good blood return noted. No bruising or swelling at site. Patient discharged in satisfactory condition. VVS stable with no signs or symptoms of distressed noted.

## 2023-04-23 NOTE — Patient Instructions (Signed)
[facility]  Discharge Instructions: Thank you for choosing [facility] to provide your oncology and hematology care.  If you have a lab appointment with the Cancer Center - please note that after April 8th, 2024, all labs will be drawn in the cancer center.  You do not have to check in or register with the main entrance as you have in the past but will complete your check-in in the cancer center.  Wear comfortable clothing and clothing appropriate for easy access to any Portacath or PICC line.   We strive to give you quality time with your provider. You may need to reschedule your appointment if you arrive late (15 or more minutes).  Arriving late affects you and other patients whose appointments are after yours.  Also, if you miss three or more appointments without notifying the office, you may be dismissed from the clinic at the provider's discretion.      For prescription refill requests, have your pharmacy contact our office and allow 72 hours for refills to be completed.    Today you received the following chemo pump disconnected, return as scheduled.     To help prevent nausea and vomiting after your treatment, we encourage you to take your nausea medication as directed.  BELOW ARE SYMPTOMS THAT SHOULD BE REPORTED IMMEDIATELY: *FEVER GREATER THAN 100.4 F (38 C) OR HIGHER *CHILLS OR SWEATING *NAUSEA AND VOMITING THAT IS NOT CONTROLLED WITH YOUR NAUSEA MEDICATION *UNUSUAL SHORTNESS OF BREATH *UNUSUAL BRUISING OR BLEEDING *URINARY PROBLEMS (pain or burning when urinating, or frequent urination) *BOWEL PROBLEMS (unusual diarrhea, constipation, pain near the anus) TENDERNESS IN MOUTH AND THROAT WITH OR WITHOUT PRESENCE OF ULCERS (sore throat, sores in mouth, or a toothache) UNUSUAL RASH, SWELLING OR PAIN  UNUSUAL VAGINAL DISCHARGE OR ITCHING   Items with * indicate a potential emergency and should be followed up as soon as possible or go to the  Emergency Department if any problems should occur.  Please show the CHEMOTHERAPY ALERT CARD or IMMUNOTHERAPY ALERT CARD at check-in to the Emergency Department and triage nurse.  Should you have questions after your visit or need to cancel or reschedule your appointment, please contact [facility]ENTER AT [facility] [phone]  and follow the prompts.  Office hours are 8:00 a.m. to 4:30 p.m. Monday - Friday. Please note that voicemails left after 4:00 p.m. may not be returned until the following business day.  We are closed weekends and major holidays. You have access to a nurse at all times for urgent questions. Please call the main number to the clinic [phone] and follow the prompts.  For any non-urgent questions, you may also contact your provider using MyChart. We now offer e-Visits for anyone 67 and older to request care online for non-urgent symptoms. For details visit [email or web address].   Also download the MyChart app! Go to the app store, search "MyChart", open the app, select [place], and log in with your MyChart username and password.

## 2023-05-04 NOTE — Progress Notes (Signed)
[facility] [address] [place] [address]    Clinic Day:  05/05/2023  Referring physician: [name], MD  Patient Care Team: [name], MD as PCP - General (Hematology) Therese Sarah, RN as Oncology Nurse Navigator (Oncology)   ASSESSMENT & PLAN:   Assessment: 1.  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 -CT chest on 05/02/2020 with no evidence of pulmonary metastatic disease. -CTAP on 05/07/2020 with multiple lesions throughout the liver, largest in the right lobe measuring 3.9 cm, lateral segment left lobe measuring 2.6 cm and inferior right lobe confluent lesion 4.2 x 3.7 cm.  No lymphadenopathy. -CEA on 05/02/2020-60.2. -FOLFOX and bevacizumab started on 06/04/2020. 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 - FOLFOX with bevacizumab started on 03/10/2023   2.  Iron deficiency anemia: -Feraheme on 04/30/2020 and 05/06/2020.   3.  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 -Smoked for 3 to 4 years and quit. -Mother had cancer, type unknown.  Maternal uncle had lung cancer.  Maternal aunt had lung cancer.    Plan: 1.  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 - Denied any bleeding per rectum or melena. - Sciatica pain has improved after Toradol. - She reported slight worsening of neuropathy.  Reviewed labs today: Normal LFTs with mildly elevated alk phos.  CBC grossly normal with platelet count 86.  Last CEA was 15.4.  Urine protein is more than 300. - She will proceed with cycle 5 of FOLFOX today.  Will hold bevacizumab.  Will decrease oxaliplatin  to 55 mg/m. - RTC 4 weeks for follow-up.  Repeat CT CAP and CEA level prior to next visit. - As her blood sugars are being high, will check hemoglobin A1c.   2.  Hypertension: - Continue Norvasc 10 mg daily and triamterene HCTZ daily.  Blood pressure today is 150/76.   3.  Severe hypokalemia: - Continue potassium 40 mill equivalents daily.  Potassium is normal.   4.  Peripheral neuropathy: - She has constant tingling in the fingertips which is stable.  Neuro neuropathy in the feet. - She takes gabapentin 100 mg 3 times daily. - She reported difficulty squeezing bottles and occasional difficulty opening jars. - Will decrease oxaliplatin dose to 55 mg/m.   5.  Anxiety: - Continue Klonopin 1 mg twice daily.  Continue Paxil 40 mg daily.  6.  Mucositis: - Continue lidocaine gel as needed.    Orders Placed This Encounter  Procedures   CT CHEST ABDOMEN PELVIS W CONTRAST    Standing Status:   Future    Standing Expiration Date:   05/04/2024    Order Specific Question:   If indicated for the ordered procedure, I authorize the administration of contrast media per Radiology protocol    Answer:   Yes    Order Specific Question:   Does the patient have a contrast media/X-ray dye allergy?    Answer:   No    Order Specific Question:   Preferred imaging location?    Answer:   [facility]    Order Specific Question:   If indicated for the ordered procedure, I authorize the administration of oral contrast media per Radiology protocol  Answer:   Yes   Hemoglobin A1c      I,Katie Daubenspeck,acting as a scribe for [name], MD.,have documented all relevant documentation on the behalf of [name], MD,as directed by  [name], MD while in the presence of [name], MD.   I, [name] MD, have reviewed the above documentation for accuracy and completeness, and I agree with the above.   [name], MD   11/6/20246:03  PM  CHIEF COMPLAINT:   Diagnosis: metastatic colon cancer to liver    Cancer Staging  No matching staging information was found for the patient.    Prior Therapy: 1. Sigmoid colectomy and end colostomy on 04/29/2020  2. FOLFOX with bevacizumab, 06/04/20 - 11/06/20  Current Therapy:  Maintenance 5-FU and bevacizumab    HISTORY OF PRESENT ILLNESS:   Oncology History  Metastatic colon cancer to liver (HCC)  05/16/2020 Initial Diagnosis   Metastatic colon cancer to liver (HCC)   06/03/2020 Genetic Testing   Foundation One:     06/04/2020 - 02/25/2022 Chemotherapy   Patient is on Treatment Plan : COLORECTAL FOLFOX + Bevacizumab q14d     06/04/2020 -  Chemotherapy   Patient is on Treatment Plan : COLORECTAL FOLFOX + Bevacizumab q14d        INTERVAL HISTORY:   Susan Martin is a 62 y.o. female presenting to clinic today for follow up of metastatic colon cancer to liver. She was last seen by me on 04/07/23.  Today, she states that she is doing well overall. Her appetite level is at 100%. Her energy level is at 50%.  PAST MEDICAL HISTORY:   Past Medical History: Past Medical History:  Diagnosis Date   Adenocarcinoma of colon metastatic to liver [facility]) onocology--- dr s. Ellin Saba   04-29-2020 emergerency surgery for perforated colon s/p sigmoid colectomy w/ colostomy; dx  Stage IV colon cancer mets to liver   Anxiety    Depression    Hypertension    followed by dr Emelda Fear and oncology  (05-27-2020 per pt does not have pcp yet)   IDA (iron deficiency anemia)    Pulmonary nodule, right     Surgical History: Past Surgical History:  Procedure Laterality Date   ABDOMINAL HYSTERECTOMY  03-31-2016   @[facility]    W/  BILATERAL SALPINOOPHORECTOMY    APPLICATION OF WOUND VAC N/A 04/29/2020   Procedure: APPLICATION OF WOUND VAC;  Surgeon: Rodman Pickle, MD;  Location: [facility] OR;  Service: General;  Laterality: N/A;   ENDOMETRIAL ABLATION     LAPAROTOMY N/A 04/29/2020   Procedure:  EXPLORATORY LAPAROTOMY;  Surgeon: Rodman Pickle, MD;  Location: [facility] OR;  Service: General;  Laterality: N/A;   PARTIAL COLECTOMY N/A 04/29/2020   Procedure: PARTIAL COLECTOMY WITH END COLOSTOMY;  Surgeon: Sheliah Hatch De Blanch, MD;  Location: [facility] OR;  Service: General;  Laterality: N/A;   PORTACATH PLACEMENT Right 05/28/2020   Procedure: INSERTION PORT-A-CATH;  Surgeon: Kinsinger, De Blanch, MD;  Location: [facility];  Service: General;  Laterality: Right;   SALPINGOOPHORECTOMY Left 03/31/2016   Procedure: LEFT SALPINGO OOPHORECTOMY WITH FROZEN SECTION,  ABDOMINAL HYSTERECTOMY WITH BILATERAL SALPINGO-OOPHORECTOMY;  Surgeon: Tilda Burrow, MD;  Location: [facility];  Service: Gynecology;  Laterality: Left;    Social History: Social History   Socioeconomic History   Marital status: Divorced    Spouse name: Not on file   Number of children: Not on file   Years of education: Not on file   Highest education level: Not on  file  Occupational History   Not on file  Tobacco Use   Smoking status: Former    Current packs/day: 0.00    Average packs/day: 0.5 packs/day for 5.0 years (2.5 ttl pk-yrs)    Types: Cigarettes    Start date: 03/27/2010    Quit date: 03/28/2015    Years since quitting: 8.1   Smokeless tobacco: Never  Vaping Use   Vaping status: Never Used  Substance and Sexual Activity   Alcohol use: No   Drug use: Never   Sexual activity: Yes    Partners: Male    Birth control/protection: Surgical  Other Topics Concern   Not on file  Social History Narrative   Not on file   Social Determinants of Health   Financial Resource Strain: Low Risk  (11/01/2019)   Overall Financial Resource Strain (CARDIA)    Difficulty of Paying Living Expenses: Not very hard  Food Insecurity: No Food Insecurity (11/01/2019)   Hunger Vital Sign    Worried About Running Out of Food in the Last Year: Never true    Ran Out of Food in the Last Year: Never true  Transportation Needs:  No Transportation Needs (11/01/2019)   PRAPARE - Administrator, Civil Service (Medical): No    Lack of Transportation (Non-Medical): No  Physical Activity: Insufficiently Active (11/01/2019)   Exercise Vital Sign    Days of Exercise per Week: 2 days    Minutes of Exercise per Session: 30 min  Stress: Stress Concern Present (11/01/2019)   [employer] of Occupational Health - Occupational Stress Questionnaire    Feeling of Stress : To some extent  Social Connections: Moderately Integrated (11/01/2019)   Social Connection and Isolation Panel [NHANES]    Frequency of Communication with Friends and Family: More than three times a week    Frequency of Social Gatherings with Friends and Family: Once a week    Attends Religious Services: More than 4 times per year    Active Member of [employer] or Organizations: No    Attends Banker Meetings: Never    Marital Status: Living with partner  Intimate Partner Violence: Not At Risk (11/01/2019)   Humiliation, Afraid, Rape, and Kick questionnaire    Fear of Current or Ex-Partner: No    Emotionally Abused: No    Physically Abused: No    Sexually Abused: No    Family History: Family History  Problem Relation Age of Onset   Hypertension Mother    Diabetes Mother    Cirrhosis Father     Current Medications:  Current Outpatient Medications:    amLODipine (NORVASC) 10 MG tablet, TAKE 1 TABLET BY MOUTH EVERY DAY, Disp: 90 tablet, Rfl: 2   BEVACIZUMAB IV, Inject 5 mg/kg into the vein every 14 (fourteen) days., Disp: , Rfl:    clonazePAM (KLONOPIN) 1 MG tablet, TAKE 1 TABLET (1 MG TOTAL) BY MOUTH IN THE MORNING, AT NOON, AND AT BEDTIME., Disp: 90 tablet, Rfl: 3   fluorouracil CALGB [address] in sodium chloride 0.9 % 150 mL, Inject 2,400 mg/m2 into the vein over 48 hr., Disp: , Rfl:    gabapentin (NEURONTIN) 100 MG capsule, TAKE 1 CAPSULE (100 MG TOTAL) BY MOUTH 3 TIMES A DAY, Disp: 90 capsule, Rfl: 3   LEUCOVORIN CALCIUM IV, Inject  400 mg/m2 into the vein every 21 ( twenty-one) days., Disp: , Rfl:    lidocaine (XYLOCAINE) 2 % solution, Use as directed 15 mLs in the mouth or throat every  6 (six) hours as needed for mouth pain. Swish and spit/swallow every six hours as needed for mouth pain, Disp: 480 mL, Rfl: 3   lidocaine-prilocaine (EMLA) cream, Apply 1 Application topically as needed., Disp: 30 g, Rfl: 0   nystatin (MYCOSTATIN) 100000 UNIT/ML suspension, Take by mouth., Disp: , Rfl:    OXALIPLATIN IV, Inject into the vein every 14 (fourteen) days., Disp: , Rfl:    oxyCODONE (OXY IR/ROXICODONE) 5 MG immediate release tablet, Take 5 mg by mouth every 6 (six) hours as needed. for pain, Disp: , Rfl:    PARoxetine (PAXIL) 40 MG tablet, Take 1 tablet (40 mg total) by mouth every morning., Disp: 90 tablet, Rfl: 3   polyethylene glycol (MIRALAX / GLYCOLAX) packet, Take 17 g by mouth every other day., Disp: , Rfl:    potassium chloride (KLOR-CON M10) 10 MEQ tablet, Take 2 tablets (20 mEq total) by mouth daily., Disp: 180 tablet, Rfl: 3   triamterene-hydrochlorothiazide (MAXZIDE-25) 37.5-25 MG tablet, TAKE 1 TABLET BY MOUTH EVERY DAY, Disp: 90 tablet, Rfl: 1 No current facility-administered medications for this visit.  Facility-Administered Medications Ordered in Other Visits:    fluorouracil (ADRUCIL) 3,500 mg in sodium chloride 0.9 % 80 mL chemo infusion, 1,700 mg/m2 (Treatment Plan Recorded), Intravenous, 1 day or 1 dose, [name], MD, Infusion Verify at 05/05/23 1614   Allergies: No Known Allergies  REVIEW OF SYSTEMS:   Review of Systems  Constitutional:  Negative for chills, fatigue and fever.  HENT:   Negative for lump/mass, mouth sores, nosebleeds, sore throat and trouble swallowing.   Eyes:  Negative for eye problems.  Respiratory:  Negative for cough and shortness of breath.   Cardiovascular:  Negative for chest pain, leg swelling and palpitations.  Gastrointestinal:  Positive for diarrhea. Negative for  abdominal pain, constipation, nausea and vomiting.  Genitourinary:  Negative for bladder incontinence, difficulty urinating, dysuria, frequency, hematuria and nocturia.   Musculoskeletal:  Negative for arthralgias, back pain, flank pain, myalgias and neck pain.  Skin:  Negative for itching and rash.  Neurological:  Positive for numbness. Negative for dizziness and headaches.  Hematological:  Does not bruise/bleed easily.  Psychiatric/Behavioral:  Positive for sleep disturbance. Negative for depression and suicidal ideas. The patient is not nervous/anxious.   All other systems reviewed and are negative.    VITALS:   There were no vitals taken for this visit.  Wt Readings from Last 3 Encounters:  05/05/23 181 lb (82.1 kg)  04/21/23 181 lb 12.8 oz (82.5 kg)  04/07/23 186 lb 6.4 oz (84.6 kg)    There is no height or weight on file to calculate BMI.  Performance status (ECOG): 1 - Symptomatic but completely ambulatory  PHYSICAL EXAM:   Physical Exam Vitals and nursing note reviewed. Exam conducted with a chaperone present.  Constitutional:      Appearance: Normal appearance.  Cardiovascular:     Rate and Rhythm: Normal rate and regular rhythm.     Pulses: Normal pulses.     Heart sounds: Normal heart sounds.  Pulmonary:     Effort: Pulmonary effort is normal.     Breath sounds: Normal breath sounds.  Abdominal:     Palpations: Abdomen is soft. There is no hepatomegaly, splenomegaly or mass.     Tenderness: There is no abdominal tenderness.  Musculoskeletal:     Right lower leg: No edema.     Left lower leg: No edema.  Lymphadenopathy:     Cervical: No cervical adenopathy.  Right cervical: No superficial, deep or posterior cervical adenopathy.    Left cervical: No superficial, deep or posterior cervical adenopathy.     Upper Body:     Right upper body: No supraclavicular or axillary adenopathy.     Left upper body: No supraclavicular or axillary adenopathy.  Neurological:      General: No focal deficit present.     Mental Status: She is alert and oriented to person, place, and time.  Psychiatric:        Mood and Affect: Mood normal.        Behavior: Behavior normal.     LABS:      Latest Ref Rng & Units 05/05/2023    8:20 AM 04/21/2023    9:04 AM 04/07/2023    7:46 AM  CBC  WBC 4.0 - 10.5 K/uL 4.5  4.8  5.6   Hemoglobin 12.0 - 15.0 g/dL 40.9  81.1  91.4   Hematocrit 36.0 - 46.0 % 38.9  42.7  42.6   Platelets 150 - 400 K/uL 86  96  80       Latest Ref Rng & Units 05/05/2023    8:20 AM 04/21/2023    9:04 AM 04/07/2023    7:46 AM  CMP  Glucose 70 - 99 mg/dL 782  956  213   BUN 8 - 23 mg/dL 11  16  12    Creatinine 0.44 - 1.00 mg/dL 0.86  5.78  4.69   Sodium 135 - 145 mmol/L 130  131  133   Potassium 3.5 - 5.1 mmol/L 3.8  3.9  3.5   Chloride 98 - 111 mmol/L 95  96  94   CO2 22 - 32 mmol/L 23  23  26    Calcium 8.9 - 10.3 mg/dL 8.4  9.0  8.8   Total Protein 6.5 - 8.1 g/dL 7.3  7.5  7.5   Total Bilirubin <1.2 mg/dL 1.1  1.2  1.2   Alkaline Phos 38 - 126 U/L 138  148  174   AST 15 - 41 U/L 38  39  48   ALT 0 - 44 U/L 27  28  34      Lab Results  Component Value Date   CEA1 15.4 (H) 02/17/2023   /  CEA  Date Value Ref Range Status  02/17/2023 15.4 (H) 0.0 - 4.7 ng/mL Final    Comment:    (NOTE)                             Nonsmokers          <3.9                             Smokers             <5.6 Roche [employer]ostics Electrochemiluminescence Immunoassay (ECLIA) Values obtained with different assay methods or kits cannot be used interchangeably.  Results cannot be interpreted as absolute evidence of the presence or absence of malignant disease. Performed At: [facility] [address] [place], [place] [id number] Jolene Schimke MD KG:[id number]    No results found for: "PSA1" No results found for: "CAN199" No results found for: "CAN125"  No results found for: "TOTALPROTELP", "ALBUMINELP", "A1GS", "A2GS", "BETS", "BETA2SER",  "GAMS", "MSPIKE", "SPEI" Lab Results  Component Value Date   TIBC 248 (L) 04/29/2020   FERRITIN 57 04/29/2020   IRONPCTSAT 3 (  L) 04/29/2020   No results found for: "LDH"   STUDIES:   No results found.

## 2023-05-05 ENCOUNTER — Inpatient Hospital Stay: Payer: No Typology Code available for payment source | Attending: Hematology | Admitting: Hematology

## 2023-05-05 ENCOUNTER — Inpatient Hospital Stay: Payer: No Typology Code available for payment source

## 2023-05-05 VITALS — BP 152/76 | HR 84 | Temp 98.7°F | Resp 20 | Wt 181.0 lb

## 2023-05-05 VITALS — BP 167/81 | HR 82 | Temp 97.7°F | Resp 18

## 2023-05-05 DIAGNOSIS — R739 Hyperglycemia, unspecified: Secondary | ICD-10-CM | POA: Diagnosis not present

## 2023-05-05 DIAGNOSIS — Z933 Colostomy status: Secondary | ICD-10-CM | POA: Insufficient documentation

## 2023-05-05 DIAGNOSIS — Z809 Family history of malignant neoplasm, unspecified: Secondary | ICD-10-CM | POA: Diagnosis not present

## 2023-05-05 DIAGNOSIS — K1230 Oral mucositis (ulcerative), unspecified: Secondary | ICD-10-CM | POA: Diagnosis not present

## 2023-05-05 DIAGNOSIS — D509 Iron deficiency anemia, unspecified: Secondary | ICD-10-CM | POA: Insufficient documentation

## 2023-05-05 DIAGNOSIS — F419 Anxiety disorder, unspecified: Secondary | ICD-10-CM | POA: Diagnosis not present

## 2023-05-05 DIAGNOSIS — C187 Malignant neoplasm of sigmoid colon: Secondary | ICD-10-CM | POA: Diagnosis not present

## 2023-05-05 DIAGNOSIS — E876 Hypokalemia: Secondary | ICD-10-CM | POA: Insufficient documentation

## 2023-05-05 DIAGNOSIS — C189 Malignant neoplasm of colon, unspecified: Secondary | ICD-10-CM | POA: Diagnosis not present

## 2023-05-05 DIAGNOSIS — I10 Essential (primary) hypertension: Secondary | ICD-10-CM | POA: Diagnosis not present

## 2023-05-05 DIAGNOSIS — C787 Secondary malignant neoplasm of liver and intrahepatic bile duct: Secondary | ICD-10-CM | POA: Insufficient documentation

## 2023-05-05 DIAGNOSIS — Z5111 Encounter for antineoplastic chemotherapy: Secondary | ICD-10-CM | POA: Diagnosis not present

## 2023-05-05 DIAGNOSIS — Z87891 Personal history of nicotine dependence: Secondary | ICD-10-CM | POA: Diagnosis not present

## 2023-05-05 DIAGNOSIS — G629 Polyneuropathy, unspecified: Secondary | ICD-10-CM | POA: Insufficient documentation

## 2023-05-05 DIAGNOSIS — Z95828 Presence of other vascular implants and grafts: Secondary | ICD-10-CM

## 2023-05-05 LAB — CBC WITH DIFFERENTIAL/PLATELET
Abs Immature Granulocytes: 0.01 10*3/uL (ref 0.00–0.07)
Basophils Absolute: 0 10*3/uL (ref 0.0–0.1)
Basophils Relative: 1 %
Eosinophils Absolute: 0.1 10*3/uL (ref 0.0–0.5)
Eosinophils Relative: 2 %
HCT: 38.9 % (ref 36.0–46.0)
Hemoglobin: 13.1 g/dL (ref 12.0–15.0)
Immature Granulocytes: 0 %
Lymphocytes Relative: 28 %
Lymphs Abs: 1.3 10*3/uL (ref 0.7–4.0)
MCH: 31 pg (ref 26.0–34.0)
MCHC: 33.7 g/dL (ref 30.0–36.0)
MCV: 92.2 fL (ref 80.0–100.0)
Monocytes Absolute: 0.6 10*3/uL (ref 0.1–1.0)
Monocytes Relative: 13 %
Neutro Abs: 2.5 10*3/uL (ref 1.7–7.7)
Neutrophils Relative %: 56 %
Platelets: 86 10*3/uL — ABNORMAL LOW (ref 150–400)
RBC: 4.22 MIL/uL (ref 3.87–5.11)
RDW: 18.6 % — ABNORMAL HIGH (ref 11.5–15.5)
WBC: 4.5 10*3/uL (ref 4.0–10.5)
nRBC: 0 % (ref 0.0–0.2)

## 2023-05-05 LAB — URINALYSIS, DIPSTICK ONLY
Bilirubin Urine: NEGATIVE
Glucose, UA: 50 mg/dL — AB
Hgb urine dipstick: NEGATIVE
Ketones, ur: NEGATIVE mg/dL
Nitrite: NEGATIVE
Protein, ur: >=300 mg/dL — AB
Specific Gravity, Urine: 1.016 (ref 1.005–1.030)
pH: 5 (ref 5.0–8.0)

## 2023-05-05 LAB — HEMOGLOBIN A1C
Hgb A1c MFr Bld: 7.4 % — ABNORMAL HIGH (ref 4.8–5.6)
Mean Plasma Glucose: 165.68 mg/dL

## 2023-05-05 LAB — COMPREHENSIVE METABOLIC PANEL
ALT: 27 U/L (ref 0–44)
AST: 38 U/L (ref 15–41)
Albumin: 3 g/dL — ABNORMAL LOW (ref 3.5–5.0)
Alkaline Phosphatase: 138 U/L — ABNORMAL HIGH (ref 38–126)
Anion gap: 12 (ref 5–15)
BUN: 11 mg/dL (ref 8–23)
CO2: 23 mmol/L (ref 22–32)
Calcium: 8.4 mg/dL — ABNORMAL LOW (ref 8.9–10.3)
Chloride: 95 mmol/L — ABNORMAL LOW (ref 98–111)
Creatinine, Ser: 0.95 mg/dL (ref 0.44–1.00)
GFR, Estimated: >60 mL/min (ref >60)
Glucose, Bld: 291 mg/dL — ABNORMAL HIGH (ref 70–99)
Potassium: 3.8 mmol/L (ref 3.5–5.1)
Sodium: 130 mmol/L — ABNORMAL LOW (ref 135–145)
Total Bilirubin: 1.1 mg/dL (ref <1.2)
Total Protein: 7.3 g/dL (ref 6.5–8.1)

## 2023-05-05 LAB — MAGNESIUM: Magnesium: 1.9 mg/dL (ref 1.7–2.4)

## 2023-05-05 MED ORDER — FLUOROURACIL CHEMO INJECTION 500 MG/10ML
270.0000 mg/m2 | Freq: Once | INTRAVENOUS | Status: AC
Start: 2023-05-05 — End: 2023-05-05
  Administered 2023-05-05: 500 mg via INTRAVENOUS
  Filled 2023-05-05: qty 10

## 2023-05-05 MED ORDER — DEXAMETHASONE SODIUM PHOSPHATE 10 MG/ML IJ SOLN
10.0000 mg | Freq: Once | INTRAMUSCULAR | Status: AC
  Administered 2023-05-05: 10 mg via INTRAVENOUS

## 2023-05-05 MED ORDER — SODIUM CHLORIDE 0.9% FLUSH
10.0000 mL | INTRAVENOUS | Status: DC | PRN
  Administered 2023-05-05: 10 mL via INTRAVENOUS

## 2023-05-05 MED ORDER — DEXTROSE 5 % IV SOLN
55.0000 mg/m2 | Freq: Once | INTRAVENOUS | Status: AC
  Administered 2023-05-05: 100 mg via INTRAVENOUS
  Filled 2023-05-05: qty 20

## 2023-05-05 MED ORDER — INSULIN ASPART 100 UNIT/ML IJ SOLN
10.0000 [IU] | Freq: Once | INTRAMUSCULAR | Status: AC
  Administered 2023-05-05: 10 [IU] via SUBCUTANEOUS
  Filled 2023-05-05: qty 0.1

## 2023-05-05 MED ORDER — DEXTROSE 5 % IV SOLN: Freq: Once | INTRAVENOUS | Status: AC

## 2023-05-05 MED ORDER — DEXTROSE 5 % IV SOLN
320.0000 mg/m2 | Freq: Once | INTRAVENOUS | Status: AC
  Administered 2023-05-05: 628 mg via INTRAVENOUS
  Filled 2023-05-05: qty 31.4

## 2023-05-05 MED ORDER — SODIUM CHLORIDE 0.9 % IV SOLN
1700.0000 mg/m2 | INTRAVENOUS | Status: DC
  Administered 2023-05-05: 3500 mg via INTRAVENOUS
  Filled 2023-05-05: qty 70

## 2023-05-05 MED ORDER — PALONOSETRON HCL INJECTION 0.25 MG/5ML
0.2500 mg | Freq: Once | INTRAVENOUS | Status: AC
  Administered 2023-05-05: 0.25 mg via INTRAVENOUS
  Filled 2023-05-05: qty 5

## 2023-05-05 MED ORDER — SODIUM CHLORIDE 0.9 % IV SOLN: 10.0000 mg | Freq: Once | INTRAVENOUS | Status: DC

## 2023-05-05 NOTE — Progress Notes (Signed)
Labs reviewed with MD today. Will proceed as planned per MD. Hold Avastin today per MD. Will give Insulin for high blood sugar per MD.    Treatment given per orders. Patient tolerated it well without problems. Vitals stable and discharged home from clinic ambulatory. Follow up as scheduled.

## 2023-05-05 NOTE — Patient Instructions (Signed)

## 2023-05-05 NOTE — Progress Notes (Signed)
Patient has been examined by Dr. Ellin Saba. Vital signs and labs have been reviewed by MD - ANC, Creatinine, LFTs, hemoglobin, and platelets (86) are within treatment parameters per M.D. - pt may proceed with treatment. Hold Avastin today per MD. Primary RN and pharmacy notified.

## 2023-05-05 NOTE — Progress Notes (Signed)
Patients port flushed without difficulty.  Good blood return noted with no bruising or swelling noted at site.  Patient remains accessed for treatment.  

## 2023-05-05 NOTE — Patient Instructions (Addendum)
[facility]  Discharge Instructions: Thank you for choosing [facility] to provide your oncology and hematology care.  If you have a lab appointment with the Cancer Center - please note that after April 8th, 2024, all labs will be drawn in the cancer center.  You do not have to check in or register with the main entrance as you have in the past but will complete your check-in in the cancer center.  Wear comfortable clothing and clothing appropriate for easy access to any Portacath or PICC line.   We strive to give you quality time with your provider. You may need to reschedule your appointment if you arrive late (15 or more minutes).  Arriving late affects you and other patients whose appointments are after yours.  Also, if you miss three or more appointments without notifying the office, you may be dismissed from the clinic at the provider's discretion.      For prescription refill requests, have your pharmacy contact our office and allow 72 hours for refills to be completed.    Today you received the following chemotherapy and/or immunotherapy agents oxaliplatin, leucovorin, adruicil   To help prevent nausea and vomiting after your treatment, we encourage you to take your nausea medication as directed.  BELOW ARE SYMPTOMS THAT SHOULD BE REPORTED IMMEDIATELY: *FEVER GREATER THAN 100.4 F (38 C) OR HIGHER *CHILLS OR SWEATING *NAUSEA AND VOMITING THAT IS NOT CONTROLLED WITH YOUR NAUSEA MEDICATION *UNUSUAL SHORTNESS OF BREATH *UNUSUAL BRUISING OR BLEEDING *URINARY PROBLEMS (pain or burning when urinating, or frequent urination) *BOWEL PROBLEMS (unusual diarrhea, constipation, pain near the anus) TENDERNESS IN MOUTH AND THROAT WITH OR WITHOUT PRESENCE OF ULCERS (sore throat, sores in mouth, or a toothache) UNUSUAL RASH, SWELLING OR PAIN  UNUSUAL VAGINAL DISCHARGE OR ITCHING   Items with * indicate a potential emergency and  should be followed up as soon as possible or go to the Emergency Department if any problems should occur.  Please show the CHEMOTHERAPY ALERT CARD or IMMUNOTHERAPY ALERT CARD at check-in to the Emergency Department and triage nurse.  Should you have questions after your visit or need to cancel or reschedule your appointment, please contact [facility] - A DEPT OF [facility] [phone]  and follow the prompts.  Office hours are 8:00 a.m. to 4:30 p.m. Monday - Friday. Please note that voicemails left after 4:00 p.m. may not be returned until the following business day.  We are closed weekends and major holidays. You have access to a nurse at all times for urgent questions. Please call the main number to the clinic [phone] and follow the prompts.  For any non-urgent questions, you may also contact your provider using MyChart. We now offer e-Visits for anyone 3 and older to request care online for non-urgent symptoms. For details visit [email or web address].   Also download the MyChart app! Go to the app store, search "MyChart", open the app, select [place], and log in with your MyChart username and password.

## 2023-05-06 ENCOUNTER — Other Ambulatory Visit (HOSPITAL_COMMUNITY): Payer: No Typology Code available for payment source

## 2023-05-06 LAB — CEA: CEA: 20.7 ng/mL — ABNORMAL HIGH (ref 0.0–4.7)

## 2023-05-07 ENCOUNTER — Inpatient Hospital Stay: Payer: No Typology Code available for payment source

## 2023-05-07 VITALS — BP 147/79 | HR 86 | Temp 97.7°F | Resp 18

## 2023-05-07 DIAGNOSIS — C189 Malignant neoplasm of colon, unspecified: Secondary | ICD-10-CM

## 2023-05-07 MED ORDER — HEPARIN SOD (PORK) LOCK FLUSH 100 UNIT/ML IV SOLN: 500.0000 [IU] | Freq: Once | INTRAVENOUS | Status: DC | PRN

## 2023-05-07 MED ORDER — SODIUM CHLORIDE 0.9% FLUSH: 10.0000 mL | INTRAVENOUS | Status: DC | PRN

## 2023-05-07 NOTE — Progress Notes (Signed)
Parmida Cogbill presents to have home infusion pump d/c'd and for port-a-cath deaccess with flush.  Portacath located right chest wall accessed with  H 20 needle.  No blood return and upon trying to flush port needle, resistance was felt, RN then took dressing off and the needle appeared to be angled where it may have up against the side of the port. No heparin or saline was flushed. Chemotherapy has completed. Pump was completed. Needle removed intact.  Procedure tolerated well and without incident.

## 2023-05-07 NOTE — Patient Instructions (Signed)
[facility]  Discharge Instructions: Thank you for choosing [facility] to provide your oncology and hematology care.  If you have a lab appointment with the Cancer Center - please note that after April 8th, 2024, all labs will be drawn in the cancer center.  You do not have to check in or register with the main entrance as you have in the past but will complete your check-in in the cancer center.  Wear comfortable clothing and clothing appropriate for easy access to any Portacath or PICC line.   We strive to give you quality time with your provider. You may need to reschedule your appointment if you arrive late (15 or more minutes).  Arriving late affects you and other patients whose appointments are after yours.  Also, if you miss three or more appointments without notifying the office, you may be dismissed from the clinic at the provider's discretion.      For prescription refill requests, have your pharmacy contact our office and allow 72 hours for refills to be completed.    Today you had your ambulatory pump disconnected.    To help prevent nausea and vomiting after your treatment, we encourage you to take your nausea medication as directed.  BELOW ARE SYMPTOMS THAT SHOULD BE REPORTED IMMEDIATELY: *FEVER GREATER THAN 100.4 F (38 C) OR HIGHER *CHILLS OR SWEATING *NAUSEA AND VOMITING THAT IS NOT CONTROLLED WITH YOUR NAUSEA MEDICATION *UNUSUAL SHORTNESS OF BREATH *UNUSUAL BRUISING OR BLEEDING *URINARY PROBLEMS (pain or burning when urinating, or frequent urination) *BOWEL PROBLEMS (unusual diarrhea, constipation, pain near the anus) TENDERNESS IN MOUTH AND THROAT WITH OR WITHOUT PRESENCE OF ULCERS (sore throat, sores in mouth, or a toothache) UNUSUAL RASH, SWELLING OR PAIN  UNUSUAL VAGINAL DISCHARGE OR ITCHING   Items with * indicate a potential emergency and should be followed up as soon as possible or go to the  Emergency Department if any problems should occur.  Please show the CHEMOTHERAPY ALERT CARD or IMMUNOTHERAPY ALERT CARD at check-in to the Emergency Department and triage nurse.  Should you have questions after your visit or need to cancel or reschedule your appointment, please contact [facility] - A DEPT OF [facility] [phone]  and follow the prompts.  Office hours are 8:00 a.m. to 4:30 p.m. Monday - Friday. Please note that voicemails left after 4:00 p.m. may not be returned until the following business day.  We are closed weekends and major holidays. You have access to a nurse at all times for urgent questions. Please call the main number to the clinic [phone] and follow the prompts.  For any non-urgent questions, you may also contact your provider using MyChart. We now offer e-Visits for anyone 71 and older to request care online for non-urgent symptoms. For details visit [email or web address].   Also download the MyChart app! Go to the app store, search "MyChart", open the app, select [facility], and log in with your MyChart username and password.

## 2023-05-24 ENCOUNTER — Inpatient Hospital Stay: Payer: No Typology Code available for payment source

## 2023-05-24 VITALS — BP 158/83 | HR 82 | Temp 98.5°F | Resp 18 | Wt 181.4 lb

## 2023-05-24 DIAGNOSIS — C189 Malignant neoplasm of colon, unspecified: Secondary | ICD-10-CM

## 2023-05-24 DIAGNOSIS — Z5111 Encounter for antineoplastic chemotherapy: Secondary | ICD-10-CM | POA: Diagnosis not present

## 2023-05-24 DIAGNOSIS — R809 Proteinuria, unspecified: Secondary | ICD-10-CM

## 2023-05-24 DIAGNOSIS — R739 Hyperglycemia, unspecified: Secondary | ICD-10-CM

## 2023-05-24 LAB — URINALYSIS, DIPSTICK ONLY
Bilirubin Urine: NEGATIVE
Glucose, UA: 50 mg/dL — AB
Hgb urine dipstick: NEGATIVE
Ketones, ur: NEGATIVE mg/dL
Nitrite: NEGATIVE
Protein, ur: >=300 mg/dL — AB
Specific Gravity, Urine: 1.015 (ref 1.005–1.030)
pH: 6 (ref 5.0–8.0)

## 2023-05-24 LAB — CBC WITH DIFFERENTIAL/PLATELET
Abs Immature Granulocytes: 0.02 10*3/uL (ref 0.00–0.07)
Basophils Absolute: 0 10*3/uL (ref 0.0–0.1)
Basophils Relative: 1 %
Eosinophils Absolute: 0.1 10*3/uL (ref 0.0–0.5)
Eosinophils Relative: 2 %
HCT: 41.3 % (ref 36.0–46.0)
Hemoglobin: 14 g/dL (ref 12.0–15.0)
Immature Granulocytes: 0 %
Lymphocytes Relative: 32 %
Lymphs Abs: 1.5 10*3/uL (ref 0.7–4.0)
MCH: 31.2 pg (ref 26.0–34.0)
MCHC: 33.9 g/dL (ref 30.0–36.0)
MCV: 92 fL (ref 80.0–100.0)
Monocytes Absolute: 1 10*3/uL (ref 0.1–1.0)
Monocytes Relative: 21 %
Neutro Abs: 2.1 10*3/uL (ref 1.7–7.7)
Neutrophils Relative %: 44 %
Platelets: 153 10*3/uL (ref 150–400)
RBC: 4.49 MIL/uL (ref 3.87–5.11)
RDW: 19.4 % — ABNORMAL HIGH (ref 11.5–15.5)
WBC: 4.7 10*3/uL (ref 4.0–10.5)
nRBC: 0 % (ref 0.0–0.2)

## 2023-05-24 LAB — COMPREHENSIVE METABOLIC PANEL
ALT: 25 U/L (ref 0–44)
AST: 39 U/L (ref 15–41)
Albumin: 3.1 g/dL — ABNORMAL LOW (ref 3.5–5.0)
Alkaline Phosphatase: 150 U/L — ABNORMAL HIGH (ref 38–126)
Anion gap: 11 (ref 5–15)
BUN: 12 mg/dL (ref 8–23)
CO2: 24 mmol/L (ref 22–32)
Calcium: 8.9 mg/dL (ref 8.9–10.3)
Chloride: 96 mmol/L — ABNORMAL LOW (ref 98–111)
Creatinine, Ser: 0.9 mg/dL (ref 0.44–1.00)
GFR, Estimated: >60 mL/min (ref >60)
Glucose, Bld: 265 mg/dL — ABNORMAL HIGH (ref 70–99)
Potassium: 3.6 mmol/L (ref 3.5–5.1)
Sodium: 131 mmol/L — ABNORMAL LOW (ref 135–145)
Total Bilirubin: 1.4 mg/dL — ABNORMAL HIGH (ref <1.2)
Total Protein: 7.8 g/dL (ref 6.5–8.1)

## 2023-05-24 LAB — MAGNESIUM: Magnesium: 1.7 mg/dL (ref 1.7–2.4)

## 2023-05-24 MED ORDER — SODIUM CHLORIDE 0.9% FLUSH
10.0000 mL | Freq: Once | INTRAVENOUS | Status: AC
  Administered 2023-05-24: 10 mL via INTRAVENOUS

## 2023-05-24 MED ORDER — SODIUM CHLORIDE 0.9 % IV SOLN: 10.0000 mg | Freq: Once | INTRAVENOUS | Status: DC

## 2023-05-24 MED ORDER — OXALIPLATIN CHEMO INJECTION 100 MG/20ML
55.0000 mg/m2 | Freq: Once | INTRAVENOUS | Status: AC
  Administered 2023-05-24: 100 mg via INTRAVENOUS
  Filled 2023-05-24: qty 20

## 2023-05-24 MED ORDER — DEXTROSE 5 % IV SOLN: Freq: Once | INTRAVENOUS | Status: AC

## 2023-05-24 MED ORDER — SODIUM CHLORIDE 0.9 % IV SOLN
1920.0000 mg/m2 | INTRAVENOUS | Status: DC
  Administered 2023-05-24: 3500 mg via INTRAVENOUS
  Filled 2023-05-24: qty 70

## 2023-05-24 MED ORDER — SODIUM CHLORIDE 0.9 % IV SOLN: Freq: Once | INTRAVENOUS | Status: DC

## 2023-05-24 MED ORDER — PALONOSETRON HCL INJECTION 0.25 MG/5ML
0.2500 mg | Freq: Once | INTRAVENOUS | Status: AC
  Administered 2023-05-24: 0.25 mg via INTRAVENOUS
  Filled 2023-05-24: qty 5

## 2023-05-24 MED ORDER — SODIUM CHLORIDE 0.9 % IV SOLN: 5.0000 mg/kg | Freq: Once | INTRAVENOUS | Status: DC

## 2023-05-24 MED ORDER — LEUCOVORIN CALCIUM INJECTION 350 MG
320.0000 mg/m2 | Freq: Once | INTRAVENOUS | Status: AC
  Administered 2023-05-24: 628 mg via INTRAVENOUS
  Filled 2023-05-24: qty 31.4

## 2023-05-24 MED ORDER — FLUOROURACIL CHEMO INJECTION 500 MG/10ML
270.0000 mg/m2 | Freq: Once | INTRAVENOUS | Status: AC
  Administered 2023-05-24: 500 mg via INTRAVENOUS
  Filled 2023-05-24: qty 10

## 2023-05-24 MED ORDER — DEXAMETHASONE SODIUM PHOSPHATE 10 MG/ML IJ SOLN
10.0000 mg | Freq: Once | INTRAMUSCULAR | Status: AC
  Administered 2023-05-24: 10 mg via INTRAVENOUS
  Filled 2023-05-24: qty 1

## 2023-05-24 MED ORDER — INSULIN ASPART 100 UNIT/ML IJ SOLN
10.0000 [IU] | Freq: Once | INTRAMUSCULAR | Status: AC
  Administered 2023-05-24: 10 [IU] via SUBCUTANEOUS
  Filled 2023-05-24: qty 0.1

## 2023-05-24 NOTE — Progress Notes (Signed)
Confirmed dose of 5FU CIV to be at 3500 mg (1920 mg/m2) with BSA: 1.96 m2  Dr Carilyn Goodpasture, PharmD

## 2023-05-24 NOTE — Progress Notes (Signed)
Patient presents today for chemotherapy infusion.  Patient is in satisfactory condition with no new complaints voiced.  Vital signs are stable.  Labs reviewed and all labs are within treatment parameters.  Urine protein collected again today due to elevated urine protein on 05/05/2023.  Blood glucose today is 265.  We will give Novolog 10 units SQ x one dose today per orders by Dr. Anders Simmonds. We will proceed with treatment per MD orders.    Urine protein is >300 today.  Dr. Anders Simmonds made aware.  We will hold MVASI today and order a 24 hour urine protein per Dr. Anders Simmonds.  Patient tolerated treatment well with no complaints voiced.  Home infusion 5FU pump connected with no issues. Instructions for 24 hour urine protein given to patient.  Patient left ambulatory in stable condition.  Vital signs stable at discharge.  Follow up as scheduled.

## 2023-05-24 NOTE — Patient Instructions (Signed)
[facility] - A DEPT OF [facility]  Discharge Instructions: Thank you for choosing [facility] to provide your oncology and hematology care.  If you have a lab appointment with the Cancer Center - please note that after April 8th, 2024, all labs will be drawn in the cancer center.  You do not have to check in or register with the main entrance as you have in the past but will complete your check-in in the cancer center.  Wear comfortable clothing and clothing appropriate for easy access to any Portacath or PICC line.   We strive to give you quality time with your provider. You may need to reschedule your appointment if you arrive late (15 or more minutes).  Arriving late affects you and other patients whose appointments are after yours.  Also, if you miss three or more appointments without notifying the office, you may be dismissed from the clinic at the provider's discretion.      For prescription refill requests, have your pharmacy contact our office and allow 72 hours for refills to be completed.    Today you received the following chemotherapy and/or immunotherapy agents Oxaliplatin/Leucovorin/Fluorouracil.  Oxaliplatin Injection What is this medication? OXALIPLATIN (ox AL i PLA tin) treats colorectal cancer. It works by slowing down the growth of cancer cells. This medicine may be used for other purposes; ask your health care provider or pharmacist if you have questions. COMMON BRAND NAME(S): Eloxatin What should I tell my care team before I take this medication? They need to know if you have any of these conditions: Heart disease History of irregular heartbeat or rhythm Liver disease Low blood cell levels (white cells, red cells, and platelets) Lung or breathing disease, such as asthma Take medications that treat or prevent blood clots Tingling of the fingers, toes, or other nerve disorder An unusual or allergic reaction to oxaliplatin,  other medications, foods, dyes, or preservatives If you or your partner are pregnant or trying to get pregnant Breast-feeding How should I use this medication? This medication is injected into a vein. It is given by your care team in a hospital or clinic setting. Talk to your care team about the use of this medication in children. Special care may be needed. Overdosage: If you think you have taken too much of this medicine contact a poison control center or emergency room at once. NOTE: This medicine is only for you. Do not share this medicine with others. What if I miss a dose? Keep appointments for follow-up doses. It is important not to miss a dose. Call your care team if you are unable to keep an appointment. What may interact with this medication? Do not take this medication with any of the following: Cisapride Dronedarone Pimozide Thioridazine This medication may also interact with the following: Aspirin and aspirin-like medications Certain medications that treat or prevent blood clots, such as warfarin, apixaban, dabigatran, and rivaroxaban Cisplatin Cyclosporine Diuretics Medications for infection, such as acyclovir, adefovir, amphotericin B, bacitracin, cidofovir, foscarnet, ganciclovir, gentamicin, pentamidine, vancomycin NSAIDs, medications for pain and inflammation, such as ibuprofen or naproxen Other medications that cause heart rhythm changes Pamidronate Zoledronic acid This list may not describe all possible interactions. Give your health care provider a list of all the medicines, herbs, non-prescription drugs, or dietary supplements you use. Also tell them if you smoke, drink alcohol, or use illegal drugs. Some items may interact with your medicine. What should I watch for while using this  medication? Your condition will be monitored carefully while you are receiving this medication. You may need blood work while taking this medication. This medication may make you feel  generally unwell. This is not uncommon as chemotherapy can affect healthy cells as well as cancer cells. Report any side effects. Continue your course of treatment even though you feel ill unless your care team tells you to stop. This medication may increase your risk of getting an infection. Call your care team for advice if you get a fever, chills, sore throat, or other symptoms of a cold or flu. Do not treat yourself. Try to avoid being around people who are sick. Avoid taking medications that contain aspirin, acetaminophen, ibuprofen, naproxen, or ketoprofen unless instructed by your care team. These medications may hide a fever. Be careful brushing or flossing your teeth or using a toothpick because you may get an infection or bleed more easily. If you have any dental work done, tell your dentist you are receiving this medication. This medication can make you more sensitive to cold. Do not drink cold drinks or use ice. Cover exposed skin before coming in contact with cold temperatures or cold objects. When out in cold weather wear warm clothing and cover your mouth and nose to warm the air that goes into your lungs. Tell your care team if you get sensitive to the cold. Talk to your care team if you or your partner are pregnant or think either of you might be pregnant. This medication can cause serious birth defects if taken during pregnancy and for 9 months after the last dose. A negative pregnancy test is required before starting this medication. A reliable form of contraception is recommended while taking this medication and for 9 months after the last dose. Talk to your care team about effective forms of contraception. Do not father a child while taking this medication and for 6 months after the last dose. Use a condom while having sex during this time period. Do not breastfeed while taking this medication and for 3 months after the last dose. This medication may cause infertility. Talk to your care  team if you are concerned about your fertility. What side effects may I notice from receiving this medication? Side effects that you should report to your care team as soon as possible: Allergic reactions--skin rash, itching, hives, swelling of the face, lips, tongue, or throat Bleeding--bloody or black, tar-like stools, vomiting blood or Emsley Custer material that looks like coffee grounds, red or dark Eamonn Sermeno urine, small red or purple spots on skin, unusual bruising or bleeding Dry cough, shortness of breath or trouble breathing Heart rhythm changes--fast or irregular heartbeat, dizziness, feeling faint or lightheaded, chest pain, trouble breathing Infection--fever, chills, cough, sore throat, wounds that don't heal, pain or trouble when passing urine, general feeling of discomfort or being unwell Liver injury--right upper belly pain, loss of appetite, nausea, light-colored stool, dark yellow or Najmah Carradine urine, yellowing skin or eyes, unusual weakness or fatigue Low red blood cell level--unusual weakness or fatigue, dizziness, headache, trouble breathing Muscle injury--unusual weakness or fatigue, muscle pain, dark yellow or Marna Weniger urine, decrease in amount of urine Pain, tingling, or numbness in the hands or feet Sudden and severe headache, confusion, change in vision, seizures, which may be signs of posterior reversible encephalopathy syndrome (PRES) Unusual bruising or bleeding Side effects that usually do not require medical attention (report to your care team if they continue or are bothersome): Diarrhea Nausea Pain, redness, or swelling with sores  inside the mouth or throat Unusual weakness or fatigue Vomiting This list may not describe all possible side effects. Call your doctor for medical advice about side effects. You may report side effects to FDA at 1-800-FDA-1088. Where should I keep my medication? This medication is given in a hospital or clinic. It will not be stored at home. NOTE: This  sheet is a summary. It may not cover all possible information. If you have questions about this medicine, talk to your doctor, pharmacist, or health care provider.  2024 Elsevier/Gold Standard (2022-03-31 00:00:00)    Leucovorin Injection What is this medication? LEUCOVORIN (loo koe VOR in) prevents side effects from certain medications, such as methotrexate. It works by increasing folate levels. This helps protect healthy cells in your body. It may also be used to treat anemia caused by low levels of folate. It can also be used with fluorouracil, a type of chemotherapy, to treat colorectal cancer. It works by increasing the effects of fluorouracil in the body. This medicine may be used for other purposes; ask your health care provider or pharmacist if you have questions. What should I tell my care team before I take this medication? They need to know if you have any of these conditions: Anemia from low levels of vitamin B12 in the blood An unusual or allergic reaction to leucovorin, folic acid, other medications, foods, dyes, or preservatives Pregnant or trying to get pregnant Breastfeeding How should I use this medication? This medication is injected into a vein or a muscle. It is given by your care team in a hospital or clinic setting. Talk to your care team about the use of this medication in children. Special care may be needed. Overdosage: If you think you have taken too much of this medicine contact a poison control center or emergency room at once. NOTE: This medicine is only for you. Do not share this medicine with others. What if I miss a dose? Keep appointments for follow-up doses. It is important not to miss your dose. Call your care team if you are unable to keep an appointment. What may interact with this medication? Capecitabine Fluorouracil Phenobarbital Phenytoin Primidone Trimethoprim;sulfamethoxazole This list may not describe all possible interactions. Give your  health care provider a list of all the medicines, herbs, non-prescription drugs, or dietary supplements you use. Also tell them if you smoke, drink alcohol, or use illegal drugs. Some items may interact with your medicine. What should I watch for while using this medication? Your condition will be monitored carefully while you are receiving this medication. This medication may increase the side effects of 5-fluorouracil. Tell your care team if you have diarrhea or mouth sores that do not get better or that get worse. What side effects may I notice from receiving this medication? Side effects that you should report to your care team as soon as possible: Allergic reactions--skin rash, itching, hives, swelling of the face, lips, tongue, or throat This list may not describe all possible side effects. Call your doctor for medical advice about side effects. You may report side effects to FDA at 1-800-FDA-1088. Where should I keep my medication? This medication is given in a hospital or clinic. It will not be stored at home. NOTE: This sheet is a summary. It may not cover all possible information. If you have questions about this medicine, talk to your doctor, pharmacist, or health care provider.  2024 Elsevier/Gold Standard (2021-11-18 00:00:00)    Fluorouracil Injection What is this medication? FLUOROURACIL (  flure oh YOOR a sil) treats some types of cancer. It works by slowing down the growth of cancer cells. This medicine may be used for other purposes; ask your health care provider or pharmacist if you have questions. COMMON BRAND NAME(S): Adrucil What should I tell my care team before I take this medication? They need to know if you have any of these conditions: Blood disorders Dihydropyrimidine dehydrogenase (DPD) deficiency Infection, such as chickenpox, cold sores, herpes Kidney disease Liver disease Poor nutrition Recent or ongoing radiation therapy An unusual or allergic reaction to  fluorouracil, other medications, foods, dyes, or preservatives If you or your partner are pregnant or trying to get pregnant Breast-feeding How should I use this medication? This medication is injected into a vein. It is administered by your care team in a hospital or clinic setting. Talk to your care team about the use of this medication in children. Special care may be needed. Overdosage: If you think you have taken too much of this medicine contact a poison control center or emergency room at once. NOTE: This medicine is only for you. Do not share this medicine with others. What if I miss a dose? Keep appointments for follow-up doses. It is important not to miss your dose. Call your care team if you are unable to keep an appointment. What may interact with this medication? Do not take this medication with any of the following: Live virus vaccines This medication may also interact with the following: Medications that treat or prevent blood clots, such as warfarin, enoxaparin, dalteparin This list may not describe all possible interactions. Give your health care provider a list of all the medicines, herbs, non-prescription drugs, or dietary supplements you use. Also tell them if you smoke, drink alcohol, or use illegal drugs. Some items may interact with your medicine. What should I watch for while using this medication? Your condition will be monitored carefully while you are receiving this medication. This medication may make you feel generally unwell. This is not uncommon as chemotherapy can affect healthy cells as well as cancer cells. Report any side effects. Continue your course of treatment even though you feel ill unless your care team tells you to stop. In some cases, you may be given additional medications to help with side effects. Follow all directions for their use. This medication may increase your risk of getting an infection. Call your care team for advice if you get a fever,  chills, sore throat, or other symptoms of a cold or flu. Do not treat yourself. Try to avoid being around people who are sick. This medication may increase your risk to bruise or bleed. Call your care team if you notice any unusual bleeding. Be careful brushing or flossing your teeth or using a toothpick because you may get an infection or bleed more easily. If you have any dental work done, tell your dentist you are receiving this medication. Avoid taking medications that contain aspirin, acetaminophen, ibuprofen, naproxen, or ketoprofen unless instructed by your care team. These medications may hide a fever. Do not treat diarrhea with over the counter products. Contact your care team if you have diarrhea that lasts more than 2 days or if it is severe and watery. This medication can make you more sensitive to the sun. Keep out of the sun. If you cannot avoid being in the sun, wear protective clothing and sunscreen. Do not use sun lamps, tanning beds, or tanning booths. Talk to your care team if you or  your partner wish to become pregnant or think you might be pregnant. This medication can cause serious birth defects if taken during pregnancy and for 3 months after the last dose. A reliable form of contraception is recommended while taking this medication and for 3 months after the last dose. Talk to your care team about effective forms of contraception. Do not father a child while taking this medication and for 3 months after the last dose. Use a condom while having sex during this time period. Do not breastfeed while taking this medication. This medication may cause infertility. Talk to your care team if you are concerned about your fertility. What side effects may I notice from receiving this medication? Side effects that you should report to your care team as soon as possible: Allergic reactions--skin rash, itching, hives, swelling of the face, lips, tongue, or throat Heart attack--pain or tightness  in the chest, shoulders, arms, or jaw, nausea, shortness of breath, cold or clammy skin, feeling faint or lightheaded Heart failure--shortness of breath, swelling of the ankles, feet, or hands, sudden weight gain, unusual weakness or fatigue Heart rhythm changes--fast or irregular heartbeat, dizziness, feeling faint or lightheaded, chest pain, trouble breathing High ammonia level--unusual weakness or fatigue, confusion, loss of appetite, nausea, vomiting, seizures Infection--fever, chills, cough, sore throat, wounds that don't heal, pain or trouble when passing urine, general feeling of discomfort or being unwell Low red blood cell level--unusual weakness or fatigue, dizziness, headache, trouble breathing Pain, tingling, or numbness in the hands or feet, muscle weakness, change in vision, confusion or trouble speaking, loss of balance or coordination, trouble walking, seizures Redness, swelling, and blistering of the skin over hands and feet Severe or prolonged diarrhea Unusual bruising or bleeding Side effects that usually do not require medical attention (report to your care team if they continue or are bothersome): Dry skin Headache Increased tears Nausea Pain, redness, or swelling with sores inside the mouth or throat Sensitivity to light Vomiting This list may not describe all possible side effects. Call your doctor for medical advice about side effects. You may report side effects to FDA at 1-800-FDA-1088. Where should I keep my medication? This medication is given in a hospital or clinic. It will not be stored at home. NOTE: This sheet is a summary. It may not cover all possible information. If you have questions about this medicine, talk to your doctor, pharmacist, or health care provider.  2024 Elsevier/Gold Standard (2021-10-21 00:00:00)        To help prevent nausea and vomiting after your treatment, we encourage you to take your nausea medication as directed.  BELOW ARE  SYMPTOMS THAT SHOULD BE REPORTED IMMEDIATELY: *FEVER GREATER THAN 100.4 F (38 C) OR HIGHER *CHILLS OR SWEATING *NAUSEA AND VOMITING THAT IS NOT CONTROLLED WITH YOUR NAUSEA MEDICATION *UNUSUAL SHORTNESS OF BREATH *UNUSUAL BRUISING OR BLEEDING *URINARY PROBLEMS (pain or burning when urinating, or frequent urination) *BOWEL PROBLEMS (unusual diarrhea, constipation, pain near the anus) TENDERNESS IN MOUTH AND THROAT WITH OR WITHOUT PRESENCE OF ULCERS (sore throat, sores in mouth, or a toothache) UNUSUAL RASH, SWELLING OR PAIN  UNUSUAL VAGINAL DISCHARGE OR ITCHING   Items with * indicate a potential emergency and should be followed up as soon as possible or go to the Emergency Department if any problems should occur.  Please show the CHEMOTHERAPY ALERT CARD or IMMUNOTHERAPY ALERT CARD at check-in to the Emergency Department and triage nurse.  Should you have questions after your visit or need to cancel or  reschedule your appointment, please contact [facility] - A DEPT OF [facility] [phone]  and follow the prompts.  Office hours are 8:00 a.m. to 4:30 p.m. Monday - Friday. Please note that voicemails left after 4:00 p.m. may not be returned until the following business day.  We are closed weekends and major holidays. You have access to a nurse at all times for urgent questions. Please call the main number to the clinic [phone] and follow the prompts.  For any non-urgent questions, you may also contact your provider using MyChart. We now offer e-Visits for anyone 29 and older to request care online for non-urgent symptoms. For details visit [email or web address].   Also download the MyChart app! Go to the app store, search "MyChart", open the app, select [place], and log in with your MyChart username and password.

## 2023-05-25 LAB — CEA: CEA: 27.5 ng/mL — ABNORMAL HIGH (ref 0.0–4.7)

## 2023-05-26 ENCOUNTER — Other Ambulatory Visit: Payer: Self-pay

## 2023-05-26 ENCOUNTER — Inpatient Hospital Stay: Payer: No Typology Code available for payment source

## 2023-05-26 VITALS — BP 163/79 | HR 92 | Temp 97.9°F | Resp 20

## 2023-05-26 DIAGNOSIS — R809 Proteinuria, unspecified: Secondary | ICD-10-CM

## 2023-05-26 DIAGNOSIS — C189 Malignant neoplasm of colon, unspecified: Secondary | ICD-10-CM

## 2023-05-26 DIAGNOSIS — Z5111 Encounter for antineoplastic chemotherapy: Secondary | ICD-10-CM | POA: Diagnosis not present

## 2023-05-26 LAB — PROTEIN, URINE, 24 HOUR
Collection Interval-UPROT: 24 h
Protein, 24H Urine: 780 mg/d — ABNORMAL HIGH (ref 50–100)
Protein, Urine: 39 mg/dL
Urine Total Volume-UPROT: 2000 mL

## 2023-05-26 MED ORDER — HEPARIN SOD (PORK) LOCK FLUSH 100 UNIT/ML IV SOLN
500.0000 [IU] | Freq: Once | INTRAVENOUS | Status: AC | PRN
Start: 2023-05-26 — End: 2023-05-26
  Administered 2023-05-26: 500 [IU]

## 2023-05-26 MED ORDER — SODIUM CHLORIDE 0.9% FLUSH
10.0000 mL | INTRAVENOUS | Status: DC | PRN
  Administered 2023-05-26: 10 mL

## 2023-05-26 NOTE — Patient Instructions (Signed)

## 2023-05-26 NOTE — Progress Notes (Signed)
Patient for chemotherapy pump disconnect with no complaints voiced.  Patients port flushed without difficulty.  Good blood return noted with no bruising or swelling noted at site.  Band aid applied.  VSS with discharge and left ambulatory with no s/s of distress noted.

## 2023-06-02 ENCOUNTER — Ambulatory Visit
Admission: RE | Admit: 2023-06-02 | Discharge: 2023-06-02 | Disposition: A | Discharge status: Home / Self Care | Payer: No Typology Code available for payment source | Source: Ambulatory Visit | Attending: Hematology | Admitting: Hematology

## 2023-06-02 ENCOUNTER — Other Ambulatory Visit: Payer: Self-pay | Admitting: Hematology

## 2023-06-02 DIAGNOSIS — R918 Other nonspecific abnormal finding of lung field: Secondary | ICD-10-CM | POA: Diagnosis not present

## 2023-06-02 DIAGNOSIS — C189 Malignant neoplasm of colon, unspecified: Secondary | ICD-10-CM | POA: Insufficient documentation

## 2023-06-02 DIAGNOSIS — R932 Abnormal findings on diagnostic imaging of liver and biliary tract: Secondary | ICD-10-CM | POA: Diagnosis not present

## 2023-06-02 DIAGNOSIS — C787 Secondary malignant neoplasm of liver and intrahepatic bile duct: Secondary | ICD-10-CM | POA: Insufficient documentation

## 2023-06-02 MED ORDER — HEPARIN SOD (PORK) LOCK FLUSH 100 UNIT/ML IV SOLN
500.0000 [IU] | Freq: Once | INTRAVENOUS | Status: AC
  Administered 2023-06-02: 500 [IU] via INTRAVENOUS
  Filled 2023-06-02: qty 5

## 2023-06-02 MED ORDER — IOHEXOL 300 MG/ML  SOLN
100.0000 mL | Freq: Once | INTRAMUSCULAR | Status: AC | PRN
  Administered 2023-06-02: 100 mL via INTRAVENOUS

## 2023-06-02 MED ORDER — HEPARIN SOD (PORK) LOCK FLUSH 100 UNIT/ML IV SOLN
INTRAVENOUS | Status: AC
  Filled 2023-06-02: qty 5

## 2023-06-08 NOTE — Progress Notes (Signed)
[facility] [address] [place] [address]    Clinic Day:  06/09/2023  Referring physician: [name], MD  Patient Care Team: [name], MD as PCP - General (Hematology) Therese Sarah, RN as Oncology Nurse Navigator (Oncology)   ASSESSMENT & PLAN:   Assessment: 1.  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 -CT chest on 05/02/2020 with no evidence of pulmonary metastatic disease. -CTAP on 05/07/2020 with multiple lesions throughout the liver, largest in the right lobe measuring 3.9 cm, lateral segment left lobe measuring 2.6 cm and inferior right lobe confluent lesion 4.2 x 3.7 cm.  No lymphadenopathy. -CEA on 05/02/2020-60.2. -FOLFOX and bevacizumab started on 06/04/2020. 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 - FOLFOX with bevacizumab from 03/10/2023 through 05/24/2023 with progression. - FOLFIRI and bevacizumab started on 08/09/2022   2.  Iron deficiency anemia: -Feraheme on 04/30/2020 and 05/06/2020.   3.  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 -Smoked for 3 to 4 years and quit. -Mother had cancer, type unknown.  Maternal uncle had lung cancer.  Maternal aunt had lung cancer.    Plan: 1.  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 - We reviewed CT CAP from 06/02/2023: Slight increase in size of bilateral lung nodules and liver lesions.  CEA has also been trending up. - Recommend discontinuation of FOLFOX and bevacizumab. - We talked about switching her to FOLFIRI and bevacizumab. - We discussed side effects in detail.  We will start her at 80%  dose of FOLFIRI. - UA today shows protein was 300.  Will hold bevacizumab.  If UA continues to be positive for protein significantly, will consider switching to EGFR antibody. - RTC 2 weeks for follow-up.   2.  Hypertension: - Continue Norvasc 10 mg daily and triamterene/HCTZ daily.  Blood pressure today is 157/79.  She reports blood pressure is better at home.   3.  Severe hypokalemia: - Continue potassium 40 mill equivalents daily.  Potassium is 3.4.   4.  Peripheral neuropathy: - Constant tingling in the fingertips is stable.  No neuropathy in the feet.  Continue gabapentin 100 mg 3 times daily.  She is having difficulty opening bottles.  We are discontinuing oxaliplatin.   5.  Anxiety: - Continue Klonopin 1 mg twice daily.  Continue Paxil 40 mg daily.  6.  Newly diagnosed diabetes: - Hemoglobin A1c on 05/05/2023 7.4.  Blood sugars have been consistently high between 200-300. - She does not have a PMD.  We will refer her to diabetic education nurse.  Will also refer to dietitian. - Will start her on glimepiride 1 mg tablet daily with breakfast and titrate up as needed.     Orders Placed This Encounter  Procedures   Magnesium    Standing Status:   Future    Standing Expiration Date:   06/22/2024   CBC with Differential    Standing Status:   Future    Standing Expiration Date:   06/22/2024   Comprehensive metabolic panel    Standing Status:   Future    Standing Expiration Date:   06/22/2024   Magnesium    Standing Status:   Future    Standing Expiration  Date:   07/06/2024   CBC with Differential    Standing Status:   Future    Standing Expiration Date:   07/06/2024   Comprehensive metabolic panel    Standing Status:   Future    Standing Expiration Date:   07/06/2024   Urinalysis, dipstick only    Standing Status:   Future    Standing Expiration Date:   07/06/2024   Magnesium    Standing Status:   Future    Standing Expiration Date:   07/20/2024   CBC with Differential     Standing Status:   Future    Standing Expiration Date:   07/20/2024   Comprehensive metabolic panel    Standing Status:   Future    Standing Expiration Date:   07/20/2024   Magnesium    Standing Status:   Future    Standing Expiration Date:   08/03/2024   CBC with Differential    Standing Status:   Future    Standing Expiration Date:   08/03/2024   Comprehensive metabolic panel    Standing Status:   Future    Standing Expiration Date:   08/03/2024   Magnesium    Standing Status:   Future    Standing Expiration Date:   08/17/2024   CBC with Differential    Standing Status:   Future    Standing Expiration Date:   08/17/2024   Comprehensive metabolic panel    Standing Status:   Future    Standing Expiration Date:   08/17/2024   Urinalysis, dipstick only    Standing Status:   Future    Standing Expiration Date:   08/17/2024      I,Katie Daubenspeck,acting as a scribe for [name], MD.,have documented all relevant documentation on the behalf of [name], MD,as directed by  [name], MD while in the presence of [name], MD.   I, [name] MD, have reviewed the above documentation for accuracy and completeness, and I agree with the above.   [name], MD   12/11/20245:14 PM  CHIEF COMPLAINT:   Diagnosis: metastatic colon cancer to liver    Cancer Staging  No matching staging information was found for the patient.    Prior Therapy: 1. Sigmoid colectomy and end colostomy on 04/29/2020  2. FOLFOX with bevacizumab, 06/04/20 - 11/06/20 3. Maintenance 5-FU and bevacizumab, 11/20/20 - 02/17/23  Current Therapy:  FOLFOX with bevacizumab   HISTORY OF PRESENT ILLNESS:   Oncology History  Metastatic colon cancer to liver (HCC)  05/16/2020 Initial Diagnosis   Metastatic colon cancer to liver (HCC)   06/03/2020 Genetic Testing   Foundation One:     06/04/2020 - 02/25/2022 Chemotherapy   Patient is on Treatment Plan :  COLORECTAL FOLFOX + Bevacizumab q14d     06/04/2020 - 05/26/2023 Chemotherapy   Patient is on Treatment Plan : COLORECTAL FOLFOX + Bevacizumab q14d     06/09/2023 -  Chemotherapy   Patient is on Treatment Plan : COLORECTAL FOLFIRI + Bevacizumab q14d        INTERVAL HISTORY:   Susan Martin is a 62 y.o. female presenting to clinic today for follow up of metastatic colon cancer to liver. She was last seen by me on 05/05/23.  Since her last visit, she underwent restaging CT C/A/P on 06/02/23 showing: new and enlarged bilateral pulmonary nodules; stable to slightly enlarged ill-defined liver metastases; unchanged stranding and nodularity throughout peritoneum and omentum; somewhat coarse contour of liver.  Today, she states that  she is doing well overall. Her appetite level is at 100%. Her energy level is at 50%.  PAST MEDICAL HISTORY:   Past Medical History: Past Medical History:  Diagnosis Date   Adenocarcinoma of colon metastatic to liver [facility]) onocology--- dr s. Ellin Saba   04-29-2020 [facility]rency surgery for perforated colon s/p sigmoid colectomy w/ colostomy; dx  Stage IV colon cancer mets to liver   Anxiety    Depression    Hypertension    followed by dr Emelda Fear and oncology  (05-27-2020 per pt does not have pcp yet)   IDA (iron deficiency anemia)    Pulmonary nodule, right     Surgical History: Past Surgical History:  Procedure Laterality Date   ABDOMINAL HYSTERECTOMY  03-31-2016   @[facility]    W/  BILATERAL SALPINOOPHORECTOMY    APPLICATION OF WOUND VAC N/A 04/29/2020   Procedure: APPLICATION OF WOUND VAC;  Surgeon: Rodman Pickle, MD;  Location: [facility] OR;  Service: General;  Laterality: N/A;   ENDOMETRIAL ABLATION     LAPAROTOMY N/A 04/29/2020   Procedure: EXPLORATORY LAPAROTOMY;  Surgeon: Rodman Pickle, MD;  Location: [facility] OR;  Service: General;  Laterality: N/A;   PARTIAL COLECTOMY N/A 04/29/2020   Procedure: PARTIAL COLECTOMY WITH END COLOSTOMY;  Surgeon: Sheliah Hatch De Blanch, MD;  Location: [facility] OR;  Service: General;  Laterality: N/A;   PORTACATH PLACEMENT Right 05/28/2020   Procedure: INSERTION PORT-A-CATH;  Surgeon: Kinsinger, De Blanch, MD;  [facility];  Service: General;  Laterality: Right;   SALPINGOOPHORECTOMY Left 03/31/2016   Procedure: LEFT SALPINGO OOPHORECTOMY WITH FROZEN SECTION,  ABDOMINAL HYSTERECTOMY WITH BILATERAL SALPINGO-OOPHORECTOMY;  Surgeon: Tilda Burrow, MD;  Location: [facility];  Service: Gynecology;  Laterality: Left;    Social History: Social History   Socioeconomic History   Marital status: Divorced    Spouse name: Not on file   Number of children: Not on file   Years of education: Not on file   Highest education level: Not on file  Occupational History   Not on file  Tobacco Use   Smoking status: Former    Current packs/day: 0.00    Average packs/day: 0.5 packs/day for 5.0 years (2.5 ttl pk-yrs)    Types: Cigarettes    Start date: 03/27/2010    Quit date: 03/28/2015    Years since quitting: 8.2   Smokeless tobacco: Never  Vaping Use   Vaping status: Never Used  Substance and Sexual Activity   Alcohol use: No   Drug use: Never   Sexual activity: Yes    Partners: Male    Birth control/protection: Surgical  Other Topics Concern   Not on file  Social History Narrative   Not on file   Social Determinants of Health   Financial Resource Strain: Low Risk  (11/01/2019)   Overall Financial Resource Strain (CARDIA)    Difficulty of Paying Living Expenses: Not very hard  Food Insecurity: No Food Insecurity (11/01/2019)   Hunger Vital Sign    Worried About Running Out of Food in the Last Year: Never true    Ran Out of Food in the Last Year: Never true  Transportation Needs: No Transportation Needs (11/01/2019)   PRAPARE - Administrator, Civil Service (Medical): No    Lack of Transportation (Non-Medical): No  Physical Activity: Insufficiently Active (11/01/2019)   Exercise Vital Sign     Days of Exercise per Week: 2 days    Minutes of Exercise per Session: 30 min  Stress: Stress Concern Present (11/01/2019)   [employer] of Occupational Health - Occupational Stress Questionnaire    Feeling of Stress : To some extent  Social Connections: Moderately Integrated (11/01/2019)   Social Connection and Isolation Panel [NHANES]    Frequency of Communication with Friends and Family: More than three times a week    Frequency of Social Gatherings with Friends and Family: Once a week    Attends Religious Services: More than 4 times per year    Active Member of [employer] or Organizations: No    Attends Banker Meetings: Never    Marital Status: Living with partner  Intimate Partner Violence: Not At Risk (11/01/2019)   Humiliation, Afraid, Rape, and Kick questionnaire    Fear of Current or Ex-Partner: No    Emotionally Abused: No    Physically Abused: No    Sexually Abused: No    Family History: Family History  Problem Relation Age of Onset   Hypertension Mother    Diabetes Mother    Cirrhosis Father     Current Medications:  Current Outpatient Medications:    amLODipine (NORVASC) 10 MG tablet, TAKE 1 TABLET BY MOUTH EVERY DAY, Disp: 90 tablet, Rfl: 2   amLODipine (NORVASC) 5 MG tablet, TAKE 1 TABLET (5 MG TOTAL) BY MOUTH DAILY., Disp: 90 tablet, Rfl: 1   BEVACIZUMAB IV, Inject 5 mg/kg into the vein every 14 (fourteen) days., Disp: , Rfl:    clonazePAM (KLONOPIN) 1 MG tablet, TAKE 1 TABLET (1 MG TOTAL) BY MOUTH IN THE MORNING, AT NOON, AND AT BEDTIME., Disp: 90 tablet, Rfl: 3   fluorouracil CALGB 65784 in sodium chloride 0.9 % 150 mL, Inject 2,400 mg/m2 into the vein over 48 hr., Disp: , Rfl:    gabapentin (NEURONTIN) 100 MG capsule, TAKE 1 CAPSULE (100 MG TOTAL) BY MOUTH 3 TIMES A DAY, Disp: 90 capsule, Rfl: 3   glimepiride (AMARYL) 1 MG tablet, Take 1 tablet (1 mg total) by mouth daily with breakfast., Disp: 30 tablet, Rfl: 1   LEUCOVORIN CALCIUM IV, Inject 400  mg/m2 into the vein every 21 ( twenty-one) days., Disp: , Rfl:    lidocaine (XYLOCAINE) 2 % solution, Use as directed 15 mLs in the mouth or throat every 6 (six) hours as needed for mouth pain. Swish and spit/swallow every six hours as needed for mouth pain, Disp: 480 mL, Rfl: 3   lidocaine-prilocaine (EMLA) cream, Apply 1 Application topically as needed., Disp: 30 g, Rfl: 0   nystatin (MYCOSTATIN) 100000 UNIT/ML suspension, Take by mouth., Disp: , Rfl:    OXALIPLATIN IV, Inject into the vein every 14 (fourteen) days., Disp: , Rfl:    oxyCODONE (OXY IR/ROXICODONE) 5 MG immediate release tablet, Take 5 mg by mouth every 6 (six) hours as needed. for pain, Disp: , Rfl:    PARoxetine (PAXIL) 40 MG tablet, Take 1 tablet (40 mg total) by mouth every morning., Disp: 90 tablet, Rfl: 3   polyethylene glycol (MIRALAX / GLYCOLAX) packet, Take 17 g by mouth every other day., Disp: , Rfl:    potassium chloride (KLOR-CON M10) 10 MEQ tablet, Take 2 tablets (20 mEq total) by mouth daily., Disp: 180 tablet, Rfl: 3   triamterene-hydrochlorothiazide (MAXZIDE-25) 37.5-25 MG tablet, TAKE 1 TABLET BY MOUTH EVERY DAY, Disp: 90 tablet, Rfl: 1 No current facility-administered medications for this visit.  Facility-Administered Medications Ordered in Other Visits:    0.9 %  sodium chloride infusion, , Intravenous, Continuous, [name], MD, Stopped at 06/09/23  1330   fluorouracil (ADRUCIL) 3,500 mg in sodium chloride 0.9 % 80 mL chemo infusion, 1,920 mg/m2 (Order-Specific), Intravenous, 1 day or 1 dose, [name], MD, Infusion Verify at 06/09/23 1510   Allergies: No Known Allergies  REVIEW OF SYSTEMS:   Review of Systems  Constitutional:  Negative for chills, fatigue and fever.  HENT:   Negative for lump/mass, mouth sores, nosebleeds, sore throat and trouble swallowing.   Eyes:  Negative for eye problems.  Respiratory:  Negative for cough and shortness of breath.   Cardiovascular:  Negative for  chest pain, leg swelling and palpitations.  Gastrointestinal:  Negative for abdominal pain, constipation, diarrhea, nausea and vomiting.  Genitourinary:  Negative for bladder incontinence, difficulty urinating, dysuria, frequency, hematuria and nocturia.   Musculoskeletal:  Negative for arthralgias, back pain, flank pain, myalgias and neck pain.  Skin:  Negative for itching and rash.  Neurological:  Positive for headaches and numbness. Negative for dizziness.  Hematological:  Does not bruise/bleed easily.  Psychiatric/Behavioral:  Positive for depression. Negative for sleep disturbance and suicidal ideas. The patient is nervous/anxious.   All other systems reviewed and are negative.    VITALS:   Blood pressure (!) 157/79, pulse 82, temperature 97.9 F (36.6 C), temperature source Tympanic, resp. rate 20, weight 181 lb 6.4 oz (82.3 kg).  Wt Readings from Last 3 Encounters:  06/09/23 181 lb 6.4 oz (82.3 kg)  05/24/23 181 lb 6.4 oz (82.3 kg)  05/05/23 181 lb (82.1 kg)    Body mass index is 32.13 kg/m.  Performance status (ECOG): 1 - Symptomatic but completely ambulatory  PHYSICAL EXAM:   Physical Exam Vitals and nursing note reviewed. Exam conducted with a chaperone present.  Constitutional:      Appearance: Normal appearance.  Cardiovascular:     Rate and Rhythm: Normal rate and regular rhythm.     Pulses: Normal pulses.     Heart sounds: Normal heart sounds.  Pulmonary:     Effort: Pulmonary effort is normal.     Breath sounds: Normal breath sounds.  Abdominal:     Palpations: Abdomen is soft. There is no hepatomegaly, splenomegaly or mass.     Tenderness: There is no abdominal tenderness.  Musculoskeletal:     Right lower leg: No edema.     Left lower leg: No edema.  Lymphadenopathy:     Cervical: No cervical adenopathy.     Right cervical: No superficial, deep or posterior cervical adenopathy.    Left cervical: No superficial, deep or posterior cervical adenopathy.      Upper Body:     Right upper body: No supraclavicular or axillary adenopathy.     Left upper body: No supraclavicular or axillary adenopathy.  Neurological:     General: No focal deficit present.     Mental Status: She is alert and oriented to person, place, and time.  Psychiatric:        Mood and Affect: Mood normal.        Behavior: Behavior normal.     LABS:      Latest Ref Rng & Units 06/09/2023    8:40 AM 05/24/2023   10:18 AM 05/05/2023    8:20 AM  CBC  WBC 4.0 - 10.5 K/uL 4.5  4.7  4.5   Hemoglobin 12.0 - 15.0 g/dL 16.1  09.6  04.5   Hematocrit 36.0 - 46.0 % 39.7  41.3  38.9   Platelets 150 - 400 K/uL 129  153  86  Latest Ref Rng & Units 06/09/2023    8:40 AM 05/24/2023   10:18 AM 05/05/2023    8:20 AM  CMP  Glucose 70 - 99 mg/dL 573  220  254   BUN 8 - 23 mg/dL 13  12  11    Creatinine 0.44 - 1.00 mg/dL 2.70  6.23  7.62   Sodium 135 - 145 mmol/L 130  131  130   Potassium 3.5 - 5.1 mmol/L 3.4  3.6  3.8   Chloride 98 - 111 mmol/L 97  96  95   CO2 22 - 32 mmol/L 22  24  23    Calcium 8.9 - 10.3 mg/dL 8.9  8.9  8.4   Total Protein 6.5 - 8.1 g/dL 7.6  7.8  7.3   Total Bilirubin <1.2 mg/dL 1.0  1.4  1.1   Alkaline Phos 38 - 126 U/L 155  150  138   AST 15 - 41 U/L 47  39  38   ALT 0 - 44 U/L 27  25  27       Lab Results  Component Value Date   CEA1 27.5 (H) 05/24/2023   /  CEA  Date Value Ref Range Status  05/24/2023 27.5 (H) 0.0 - 4.7 ng/mL Final    Comment:    (NOTE)                             Nonsmokers          <3.9                             Smokers             <5.6 Roche [employer] Electrochemiluminescence Immunoassay (ECLIA) Values obtained with different assay methods or kits cannot be used interchangeably.  Results cannot be interpreted as absolute evidence of the presence or absence of malignant disease. Performed At: [facility] [address] [place], [place] [id number] Jolene Schimke MD [id number]    No results found  for: "PSA1" No results found for: "[id number]" No results found for: "CAN125"  No results found for: "TOTALPROTELP", "ALBUMINELP", "A1GS", "A2GS", "BETS", "BETA2SER", "GAMS", "MSPIKE", "SPEI" Lab Results  Component Value Date   TIBC 248 (L) 04/29/2020   FERRITIN 57 04/29/2020   IRONPCTSAT 3 (L) 04/29/2020   No results found for: "LDH"   STUDIES:   CT CHEST ABDOMEN PELVIS W CONTRAST  Result Date: 06/07/2023 CLINICAL DATA:  Metastatic colon cancer restaging * Tracking Code: BO * EXAM: CT CHEST, ABDOMEN, AND PELVIS WITH CONTRAST TECHNIQUE: Multidetector CT imaging of the chest, abdomen and pelvis was performed following the standard protocol during bolus administration of intravenous contrast. RADIATION DOSE REDUCTION: This exam was performed according to the departmental dose-optimization program which includes automated exposure control, adjustment of the mA and/or kV according to patient size and/or use of iterative reconstruction technique. CONTRAST:  OMNIPAQUE IOHEXOL 300 MG/ML SOLN additional oral enteric contrast COMPARISON:  02/25/2023 FINDINGS: CT CHEST FINDINGS Cardiovascular: Right chest port catheter. Normal heart size. No pericardial effusion. Mediastinum/Nodes: No enlarged mediastinal, hilar, or axillary lymph nodes. Thyroid gland, trachea, and esophagus demonstrate no significant findings. Lungs/Pleura: Numerous bilateral pulmonary nodules, new and enlarged compared to prior examination, index nodule in the anterior right apex measuring 0.8 x 0.7 cm, previously no greater than 0.4 cm (series 4, image 37), index nodule in the medial right lower lobe measuring 1.2  x 0.9 cm, previously 0.5 cm (series 4, image 93). No pleural effusion or pneumothorax. Musculoskeletal: No chest wall abnormality. No acute osseous findings. CT ABDOMEN PELVIS FINDINGS Hepatobiliary: Somewhat coarse contour of the liver. Several very ill-defined liver metastases, some of which are not appreciably changed,  others of which appear to have slightly enlarged, for example in the superior left lobe of the liver, hepatic segment II measuring 2.7 x 2.2 cm, previously 1.9 x 0.9 cm (series 2, image 49) and adjacent to the gallbladder fossa, hepatic segment V measuring 2.8 x 1.8 cm, previously 2.8 x 1.8 cm (series 2, image 65)., very ill-defined lesion in the posterior liver dome, hepatic segment VIII measures 3.6 x 2.6 cm, previously 3.1 x 2.4 cm when measured similarly (series 2, image 54). No gallstones, gallbladder wall thickening, or biliary dilatation. Pancreas: Unremarkable. No pancreatic ductal dilatation or surrounding inflammatory changes. Spleen: Splenomegaly, maximum coronal span 14.2 cm. Adrenals/Urinary Tract: Adrenal glands are unremarkable. Kidneys are normal, without renal calculi, solid lesion, or hydronephrosis. Bladder is unremarkable. Stomach/Bowel: Stomach is within normal limits. Status post sigmoid colon resection with rectal stump and left lower quadrant end colostomy. Vascular/Lymphatic: Aortic atherosclerosis. No enlarged abdominal or pelvic lymph nodes. Reproductive: Status post hysterectomy. Other: Left lower quadrant end colostomy with a fat containing parastomal hernia (series 2, image 84). No ascites. Unchanged stranding and nodularity throughout the peritoneum and omentum, without discretely measurable nodules (series 2, image 81). Small incidental splenule in the left upper quadrant which is not reflect peritoneal metastatic disease (series 2, image 66). Musculoskeletal: No acute osseous findings. IMPRESSION: 1. Numerous bilateral pulmonary nodules, new and enlarged compared to prior examination, consistent with worsened pulmonary metastatic disease. 2. Several very ill-defined liver metastases, some of which are not appreciably changed, others of which appear to have slightly enlarged. Findings are most consistent with slight overall progression of hepatic metastatic disease. Given ill-defined  appearance of the lesions by CT, MRI may be helpful for future assessment of liver disease, if desired. 3. Unchanged stranding and nodularity throughout the peritoneum and omentum, consistent with peritoneal metastatic disease, without discretely measurable nodules. 4. Somewhat coarse contour of the liver, likely reflecting some degree of pseudocirrhosis in the setting of underlying treated hepatic metastatic disease. Correlate for biochemical evidence of cirrhosis. 5. Status post sigmoid colon resection with rectal stump and left lower quadrant end colostomy. 6. Splenomegaly. Aortic Atherosclerosis (ICD10-I70.0). Electronically Signed   By: Jearld Lesch M.D.   On: 06/07/2023 07:24

## 2023-06-09 ENCOUNTER — Inpatient Hospital Stay: Payer: No Typology Code available for payment source

## 2023-06-09 ENCOUNTER — Inpatient Hospital Stay: Payer: No Typology Code available for payment source | Attending: Hematology | Admitting: Hematology

## 2023-06-09 ENCOUNTER — Encounter: Payer: Self-pay | Admitting: Hematology

## 2023-06-09 VITALS — BP 143/79 | HR 79 | Temp 97.2°F | Resp 18

## 2023-06-09 VITALS — BP 157/79 | HR 82 | Temp 97.9°F | Resp 20 | Wt 181.4 lb

## 2023-06-09 DIAGNOSIS — I10 Essential (primary) hypertension: Secondary | ICD-10-CM | POA: Diagnosis not present

## 2023-06-09 DIAGNOSIS — C187 Malignant neoplasm of sigmoid colon: Secondary | ICD-10-CM | POA: Insufficient documentation

## 2023-06-09 DIAGNOSIS — R197 Diarrhea, unspecified: Secondary | ICD-10-CM | POA: Diagnosis not present

## 2023-06-09 DIAGNOSIS — E1142 Type 2 diabetes mellitus with diabetic polyneuropathy: Secondary | ICD-10-CM | POA: Insufficient documentation

## 2023-06-09 DIAGNOSIS — Z5111 Encounter for antineoplastic chemotherapy: Secondary | ICD-10-CM | POA: Insufficient documentation

## 2023-06-09 DIAGNOSIS — R918 Other nonspecific abnormal finding of lung field: Secondary | ICD-10-CM | POA: Insufficient documentation

## 2023-06-09 DIAGNOSIS — F419 Anxiety disorder, unspecified: Secondary | ICD-10-CM | POA: Diagnosis not present

## 2023-06-09 DIAGNOSIS — C787 Secondary malignant neoplasm of liver and intrahepatic bile duct: Secondary | ICD-10-CM | POA: Insufficient documentation

## 2023-06-09 DIAGNOSIS — Z87891 Personal history of nicotine dependence: Secondary | ICD-10-CM | POA: Insufficient documentation

## 2023-06-09 DIAGNOSIS — I700 Atherosclerosis of aorta: Secondary | ICD-10-CM | POA: Diagnosis not present

## 2023-06-09 DIAGNOSIS — C189 Malignant neoplasm of colon, unspecified: Secondary | ICD-10-CM

## 2023-06-09 DIAGNOSIS — Z933 Colostomy status: Secondary | ICD-10-CM | POA: Diagnosis not present

## 2023-06-09 DIAGNOSIS — E876 Hypokalemia: Secondary | ICD-10-CM | POA: Insufficient documentation

## 2023-06-09 DIAGNOSIS — Z801 Family history of malignant neoplasm of trachea, bronchus and lung: Secondary | ICD-10-CM | POA: Insufficient documentation

## 2023-06-09 DIAGNOSIS — D509 Iron deficiency anemia, unspecified: Secondary | ICD-10-CM | POA: Insufficient documentation

## 2023-06-09 DIAGNOSIS — Z7984 Long term (current) use of oral hypoglycemic drugs: Secondary | ICD-10-CM | POA: Insufficient documentation

## 2023-06-09 DIAGNOSIS — E119 Type 2 diabetes mellitus without complications: Secondary | ICD-10-CM | POA: Diagnosis not present

## 2023-06-09 LAB — URINALYSIS, DIPSTICK ONLY
Bilirubin Urine: NEGATIVE
Glucose, UA: 50 mg/dL — AB
Ketones, ur: NEGATIVE mg/dL
Nitrite: NEGATIVE
Protein, ur: >=300 mg/dL — AB
Specific Gravity, Urine: 1.014 (ref 1.005–1.030)
pH: 6 (ref 5.0–8.0)

## 2023-06-09 LAB — CBC WITH DIFFERENTIAL/PLATELET
Abs Immature Granulocytes: 0.01 10*3/uL (ref 0.00–0.07)
Basophils Absolute: 0 10*3/uL (ref 0.0–0.1)
Basophils Relative: 1 %
Eosinophils Absolute: 0.2 10*3/uL (ref 0.0–0.5)
Eosinophils Relative: 3 %
HCT: 39.7 % (ref 36.0–46.0)
Hemoglobin: 13 g/dL (ref 12.0–15.0)
Immature Granulocytes: 0 %
Lymphocytes Relative: 26 %
Lymphs Abs: 1.2 10*3/uL (ref 0.7–4.0)
MCH: 30.6 pg (ref 26.0–34.0)
MCHC: 32.7 g/dL (ref 30.0–36.0)
MCV: 93.4 fL (ref 80.0–100.0)
Monocytes Absolute: 0.7 10*3/uL (ref 0.1–1.0)
Monocytes Relative: 15 %
Neutro Abs: 2.4 10*3/uL (ref 1.7–7.7)
Neutrophils Relative %: 55 %
Platelets: 129 10*3/uL — ABNORMAL LOW (ref 150–400)
RBC: 4.25 MIL/uL (ref 3.87–5.11)
RDW: 18.6 % — ABNORMAL HIGH (ref 11.5–15.5)
WBC: 4.5 10*3/uL (ref 4.0–10.5)
nRBC: 0 % (ref 0.0–0.2)

## 2023-06-09 LAB — COMPREHENSIVE METABOLIC PANEL
ALT: 27 U/L (ref 0–44)
AST: 47 U/L — ABNORMAL HIGH (ref 15–41)
Albumin: 3.1 g/dL — ABNORMAL LOW (ref 3.5–5.0)
Alkaline Phosphatase: 155 U/L — ABNORMAL HIGH (ref 38–126)
Anion gap: 11 (ref 5–15)
BUN: 13 mg/dL (ref 8–23)
CO2: 22 mmol/L (ref 22–32)
Calcium: 8.9 mg/dL (ref 8.9–10.3)
Chloride: 97 mmol/L — ABNORMAL LOW (ref 98–111)
Creatinine, Ser: 0.83 mg/dL (ref 0.44–1.00)
GFR, Estimated: >60 mL/min (ref >60)
Glucose, Bld: 276 mg/dL — ABNORMAL HIGH (ref 70–99)
Potassium: 3.4 mmol/L — ABNORMAL LOW (ref 3.5–5.1)
Sodium: 130 mmol/L — ABNORMAL LOW (ref 135–145)
Total Bilirubin: 1 mg/dL (ref <1.2)
Total Protein: 7.6 g/dL (ref 6.5–8.1)

## 2023-06-09 LAB — MAGNESIUM: Magnesium: 1.8 mg/dL (ref 1.7–2.4)

## 2023-06-09 MED ORDER — SODIUM CHLORIDE 0.9 % IV SOLN: 10.0000 mg | Freq: Once | INTRAVENOUS | Status: DC

## 2023-06-09 MED ORDER — FLUOROURACIL CHEMO INJECTION 2.5 GM/50ML
320.0000 mg/m2 | Freq: Once | INTRAVENOUS | Status: AC
  Administered 2023-06-09: 600 mg via INTRAVENOUS
  Filled 2023-06-09: qty 12

## 2023-06-09 MED ORDER — LEUCOVORIN CALCIUM INJECTION 350 MG
320.0000 mg/m2 | Freq: Once | INTRAMUSCULAR | Status: AC
  Administered 2023-06-09: 608 mg via INTRAVENOUS
  Filled 2023-06-09: qty 30.4

## 2023-06-09 MED ORDER — SODIUM CHLORIDE 0.9 % IV SOLN
INTRAVENOUS | Status: DC
Start: 2023-06-09 — End: 2023-06-09

## 2023-06-09 MED ORDER — SODIUM CHLORIDE 0.9 % IV SOLN
144.0000 mg/m2 | Freq: Once | INTRAVENOUS | Status: AC
  Administered 2023-06-09: 300 mg via INTRAVENOUS
  Filled 2023-06-09: qty 15

## 2023-06-09 MED ORDER — POTASSIUM CHLORIDE CRYS ER 20 MEQ PO TBCR
40.0000 meq | EXTENDED_RELEASE_TABLET | Freq: Once | ORAL | Status: AC
  Administered 2023-06-09: 40 meq via ORAL
  Filled 2023-06-09: qty 2

## 2023-06-09 MED ORDER — PALONOSETRON HCL INJECTION 0.25 MG/5ML
0.2500 mg | Freq: Once | INTRAVENOUS | Status: AC
  Administered 2023-06-09: 0.25 mg via INTRAVENOUS
  Filled 2023-06-09: qty 5

## 2023-06-09 MED ORDER — SODIUM CHLORIDE 0.9 % IV SOLN
1920.0000 mg/m2 | INTRAVENOUS | Status: DC
  Administered 2023-06-09: 3500 mg via INTRAVENOUS
  Filled 2023-06-09: qty 70

## 2023-06-09 MED ORDER — GLIMEPIRIDE 1 MG PO TABS: 1.0000 mg | ORAL_TABLET | Freq: Every day | ORAL | 1 refills | Status: DC

## 2023-06-09 MED ORDER — INSULIN ASPART 100 UNIT/ML IJ SOLN
10.0000 [IU] | Freq: Once | INTRAMUSCULAR | Status: AC
  Administered 2023-06-09: 10 [IU] via SUBCUTANEOUS
  Filled 2023-06-09: qty 0.1

## 2023-06-09 MED ORDER — ATROPINE SULFATE 1 MG/ML IV SOLN
0.5000 mg | Freq: Once | INTRAVENOUS | Status: AC | PRN
  Administered 2023-06-09: 0.5 mg via INTRAVENOUS
  Filled 2023-06-09: qty 1

## 2023-06-09 MED ORDER — DEXAMETHASONE SODIUM PHOSPHATE 10 MG/ML IJ SOLN
10.0000 mg | Freq: Once | INTRAMUSCULAR | Status: AC
Start: 2023-06-09 — End: 2023-06-09
  Administered 2023-06-09: 10 mg via INTRAVENOUS
  Filled 2023-06-09: qty 1

## 2023-06-09 MED ORDER — BEVACIZUMAB-AWWB CHEMO INJECTION 400 MG/16ML: 5.0000 mg/kg | Freq: Once | INTRAVENOUS | Status: DC

## 2023-06-09 MED ORDER — SODIUM CHLORIDE 0.9% FLUSH
10.0000 mL | INTRAVENOUS | Status: AC
  Administered 2023-06-09: 10 mL

## 2023-06-09 NOTE — Progress Notes (Signed)
Patient presents today for Folfiri , Avastin. Patient has no complaints of any changes since her last treatment. Complaints noted today of increased fatigue. Labs pending.   Message received from Dr. Ellin Saba / A. Anderson RN to proceed with treatment. Protein in urine >300. MD aware and labs reviewed. Patient's normal.   New consent obtained for irinotecan.   Treatment given today per MD orders. Tolerated infusion without adverse affects. Vital signs stable. RUN noted on pump and verified with patient. No complaints at this time. Discharged from clinic ambulatory in stable condition. Alert and oriented x 3. F/U with [facility] as scheduled.

## 2023-06-09 NOTE — Patient Instructions (Addendum)
[facility] at [facility] Discharge Instructions   You were seen and examined today by Dr. Ellin Saba.  He reviewed the results of your lab work which are normal/stable.   He reviewed the results of your CT scan. It is showing new spots in the lung and slight increase in the spots in the liver. Dr. Kirtland Bouchard would like to change your treatment. Treatment would be exactly the same with the exception of one chemotherapy drug. We will swap out the oxaliplatin you have been getting with a chemo drug called irinotecan.   We will proceed with your treatment today.   Dr. Kirtland Bouchard is sending a prescription for you to help control your blood sugar. We will make a referral to a diabetic educator and provide you a number for a primary care doctor.   Return as scheduled.    Thank you for choosing  Can[facility] Center at [facility] to provide your oncology and hematology care.  To afford each patient quality time with our provider, please arrive at least 15 minutes before your scheduled appointment time.   If you have a lab appointment with the Cancer Center please come in thru the Main Entrance and check in at the main information desk.  You need to re-schedule your appointment should you arrive 10 or more minutes late.  We strive to give you quality time with our providers, and arriving late affects you and other patients whose appointments are after yours.  Also, if you no show three or more times for appointments you may be dismissed from the clinic at the providers discretion.     Again, thank you for choosing [facility].  Our hope is that these requests will decrease the amount of time that you wait before being seen by our physicians.       _____________________________________________________________  Should you have questions after your visit to [facility], please contact our office at [phone] and follow the prompts.  Our office hours are  8:00 a.m. and 4:30 p.m. Monday - Friday.  Please note that voicemails left after 4:00 p.m. may not be returned until the following business day.  We are closed weekends and major holidays.  You do have access to a nurse 24-7, just call the main number to the clinic [phone] and do not press any options, hold on the line and a nurse will answer the phone.    For prescription refill requests, have your pharmacy contact our office and allow 72 hours.    Due to Covid, you will need to wear a mask upon entering the hospital. If you do not have a mask, a mask will be given to you at the Main Entrance upon arrival. For doctor visits, patients may have 1 support person age 92 or older with them. For treatment visits, patients can not have anyone with them due to social distancing guidelines and our immunocompromised population.

## 2023-06-09 NOTE — Patient Instructions (Signed)
[facility] [place] - A DEPT OF [facility]  Discharge Instructions: Thank you for choosing [facility] to provide your oncology and hematology care.  If you have a lab appointment with the Cancer Center - please note that after April 8th, 2024, all labs will be drawn in the cancer center.  You do not have to check in or register with the main entrance as you have in the past but will complete your check-in in the cancer center.  Wear comfortable clothing and clothing appropriate for easy access to any Portacath or PICC line.   We strive to give you quality time with your provider. You may need to reschedule your appointment if you arrive late (15 or more minutes).  Arriving late affects you and other patients whose appointments are after yours.  Also, if you miss three or more appointments without notifying the office, you may be dismissed from the clinic at the provider's discretion.      For prescription refill requests, have your pharmacy contact our office and allow 72 hours for refills to be completed.    Today you received the following chemotherapy and/or immunotherapy agents Irinotecan, Leucovorin, Adrucil. Leucovorin Injection What is this medication? LEUCOVORIN (loo koe VOR in) prevents side effects from certain medications, such as methotrexate. It works by increasing folate levels. This helps protect healthy cells in your body. It may also be used to treat anemia caused by low levels of folate. It can also be used with fluorouracil, a type of chemotherapy, to treat colorectal cancer. It works by increasing the effects of fluorouracil in the body. This medicine may be used for other purposes; ask your health care provider or pharmacist if you have questions. What should I tell my care team before I take this medication? They need to know if you have any of these conditions: Anemia from low levels of vitamin B12 in the blood An unusual or allergic  reaction to leucovorin, folic acid, other medications, foods, dyes, or preservatives Pregnant or trying to get pregnant Breastfeeding How should I use this medication? This medication is injected into a vein or a muscle. It is given by your care team in a hospital or clinic setting. Talk to your care team about the use of this medication in children. Special care may be needed. Overdosage: If you think you have taken too much of this medicine contact a poison control center or emergency room at once. NOTE: This medicine is only for you. Do not share this medicine with others. What if I miss a dose? Keep appointments for follow-up doses. It is important not to miss your dose. Call your care team if you are unable to keep an appointment. What may interact with this medication? Capecitabine Fluorouracil Phenobarbital Phenytoin Primidone Trimethoprim;sulfamethoxazole This list may not describe all possible interactions. Give your health care provider a list of all the medicines, herbs, non-prescription drugs, or dietary supplements you use. Also tell them if you smoke, drink alcohol, or use illegal drugs. Some items may interact with your medicine. What should I watch for while using this medication? Your condition will be monitored carefully while you are receiving this medication. This medication may increase the side effects of 5-fluorouracil. Tell your care team if you have diarrhea or mouth sores that do not get better or that get worse. What side effects may I notice from receiving this medication? Side effects that you should report to your care team as  soon as possible: Allergic reactions--skin rash, itching, hives, swelling of the face, lips, tongue, or throat This list may not describe all possible side effects. Call your doctor for medical advice about side effects. You may report side effects to FDA at 1-800-FDA-1088. Where should I keep my medication? This medication is given in a  hospital or clinic. It will not be stored at home. NOTE: This sheet is a summary. It may not cover all possible information. If you have questions about this medicine, talk to your doctor, pharmacist, or health care provider.  2024 Elsevier/Gold Standard (2021-11-18 00:00:00) Irinotecan Injection What is this medication? IRINOTECAN (ir in oh TEE kan) treats some types of cancer. It works by slowing down the growth of cancer cells. This medicine may be used for other purposes; ask your health care provider or pharmacist if you have questions. COMMON BRAND NAME(S): Camptosar What should I tell my care team before I take this medication? They need to know if you have any of these conditions: Dehydration Diarrhea Infection, especially a viral infection, such as chickenpox, cold sores, herpes Liver disease Low blood cell levels (white cells, red cells, and platelets) Low levels of electrolytes, such as calcium, magnesium, or potassium in your blood Recent or ongoing radiation An unusual or allergic reaction to irinotecan, other medications, foods, dyes, or preservatives If you or your partner are pregnant or trying to get pregnant Breast-feeding How should I use this medication? This medication is injected into a vein. It is given by your care team in a hospital or clinic setting. Talk to your care team about the use of this medication in children. Special care may be needed. Overdosage: If you think you have taken too much of this medicine contact a poison control center or emergency room at once. NOTE: This medicine is only for you. Do not share this medicine with others. What if I miss a dose? Keep appointments for follow-up doses. It is important not to miss your dose. Call your care team if you are unable to keep an appointment. What may interact with this medication? Do not take this medication with any of the following: Cobicistat Itraconazole This medication may also interact with  the following: Certain antibiotics, such as clarithromycin, rifampin, rifabutin Certain antivirals for HIV or AIDS Certain medications for fungal infections, such as ketoconazole, posaconazole, voriconazole Certain medications for seizures, such as carbamazepine, phenobarbital, phenytoin Gemfibrozil Nefazodone St. John's wort This list may not describe all possible interactions. Give your health care provider a list of all the medicines, herbs, non-prescription drugs, or dietary supplements you use. Also tell them if you smoke, drink alcohol, or use illegal drugs. Some items may interact with your medicine. What should I watch for while using this medication? Your condition will be monitored carefully while you are receiving this medication. You may need blood work while taking this medication. This medication may make you feel generally unwell. This is not uncommon as chemotherapy can affect healthy cells as well as cancer cells. Report any side effects. Continue your course of treatment even though you feel ill unless your care team tells you to stop. This medication can cause serious side effects. To reduce the risk, your care team may give you other medications to take before receiving this one. Be sure to follow the directions from your care team. This medication may affect your coordination, reaction time, or judgement. Do not drive or operate machinery until you know how this medication affects you. Sit up or  stand slowly to reduce the risk of dizzy or fainting spells. Drinking alcohol with this medication can increase the risk of these side effects. This medication may increase your risk of getting an infection. Call your care team for advice if you get a fever, chills, sore throat, or other symptoms of a cold or flu. Do not treat yourself. Try to avoid being around people who are sick. Avoid taking medications that contain aspirin, acetaminophen, ibuprofen, naproxen, or ketoprofen unless  instructed by your care team. These medications may hide a fever. This medication may increase your risk to bruise or bleed. Call your care team if you notice any unusual bleeding. Be careful brushing or flossing your teeth or using a toothpick because you may get an infection or bleed more easily. If you have any dental work done, tell your dentist you are receiving this medication. Talk to your care team if you or your partner are pregnant or think either of you might be pregnant. This medication can cause serious birth defects if taken during pregnancy and for 6 months after the last dose. You will need a negative pregnancy test before starting this medication. Contraception is recommended while taking this medication and for 6 months after the last dose. Your care team can help you find the option that works for you. Do not father a child while taking this medication and for 3 months after the last dose. Use a condom for contraception during this time period. Do not breastfeed while taking this medication and for 7 days after the last dose. This medication may cause infertility. Talk to your care team if you are concerned about your fertility. What side effects may I notice from receiving this medication? Side effects that you should report to your care team as soon as possible: Allergic reactions--skin rash, itching, hives, swelling of the face, lips, tongue, or throat Dry cough, shortness of breath or trouble breathing Increased saliva or tears, increased sweating, stomach cramping, diarrhea, small pupils, unusual weakness or fatigue, slow heartbeat Infection--fever, chills, cough, sore throat, wounds that don't heal, pain or trouble when passing urine, general feeling of discomfort or being unwell Kidney injury--decrease in the amount of urine, swelling of the ankles, hands, or feet Low red blood cell level--unusual weakness or fatigue, dizziness, headache, trouble breathing Severe or prolonged  diarrhea Unusual bruising or bleeding Side effects that usually do not require medical attention (report to your care team if they continue or are bothersome): Constipation Diarrhea Hair loss Loss of appetite Nausea Stomach pain This list may not describe all possible side effects. Call your doctor for medical advice about side effects. You may report side effects to FDA at 1-800-FDA-1088. Where should I keep my medication? This medication is given in a hospital or clinic. It will not be stored at home. NOTE: This sheet is a summary. It may not cover all possible information. If you have questions about this medicine, talk to your doctor, pharmacist, or health care provider.  2024 Elsevier/Gold Standard (2021-10-27 00:00:00)       To help prevent nausea and vomiting after your treatment, we encourage you to take your nausea medication as directed.  BELOW ARE SYMPTOMS THAT SHOULD BE REPORTED IMMEDIATELY: *FEVER GREATER THAN 100.4 F (38 C) OR HIGHER *CHILLS OR SWEATING *NAUSEA AND VOMITING THAT IS NOT CONTROLLED WITH YOUR NAUSEA MEDICATION *UNUSUAL SHORTNESS OF BREATH *UNUSUAL BRUISING OR BLEEDING *URINARY PROBLEMS (pain or burning when urinating, or frequent urination) *BOWEL PROBLEMS (unusual diarrhea, constipation, pain near  the anus) TENDERNESS IN MOUTH AND THROAT WITH OR WITHOUT PRESENCE OF ULCERS (sore throat, sores in mouth, or a toothache) UNUSUAL RASH, SWELLING OR PAIN  UNUSUAL VAGINAL DISCHARGE OR ITCHING   Items with * indicate a potential emergency and should be followed up as soon as possible or go to the Emergency Department if any problems should occur.  Please show the CHEMOTHERAPY ALERT CARD or IMMUNOTHERAPY ALERT CARD at check-in to the Emergency Department and triage nurse.  Should you have questions after your visit or need to cancel or reschedule your appointment, please contact [facility] [place] - A DEPT OF [facility]igh[facility] [phone]   and follow the prompts.  Office hours are 8:00 a.m. to 4:30 p.m. Monday - Friday. Please note that voicemails left after 4:00 p.m. may not be returned until the following business day.  We are closed weekends and major holidays. You have access to a nurse at all times for urgent questions. Please call the main number to the clinic [phone] and follow the prompts.  For any non-urgent questions, you may also contact your provider using MyChart. We now offer e-Visits for anyone 59 and older to request care online for non-urgent symptoms. For details visit [email or web address].   Also download the MyChart app! Go to the app store, search "MyChart", open the app, select [place], and log in with your MyChart username and password.

## 2023-06-09 NOTE — Progress Notes (Signed)
Pharmacist Chemotherapy Monitoring - Initial Assessment    Anticipated start date: 06/09/23   The following has been reviewed per standard work regarding the patient's treatment regimen: The patient's diagnosis, treatment plan and drug doses, and organ/hematologic function Lab orders and baseline tests specific to treatment regimen  The treatment plan start date, drug sequencing, and pre-medications Prior authorization status  Patient's documented medication list, including drug-drug interaction screen and prescriptions for anti-emetics and supportive care specific to the treatment regimen The drug concentrations, fluid compatibility, administration routes, and timing of the medications to be used The patient's access for treatment and lifetime cumulative dose history, if applicable  The patient's medication allergies and previous infusion related reactions, if applicable   Changes made to treatment plan:  N/A  Follow up needed:  N/A  Treatment changed due to progression - Hold Mvasi today with UP 300 per Dr Ellin Saba.   Stephens Shire, RPH, 06/09/2023  9:56 AM

## 2023-06-09 NOTE — Progress Notes (Signed)
Patient has been examined by Dr. Ellin Saba. Vital signs and labs have been reviewed by MD - ANC, Creatinine, LFTs, hemoglobin, and platelets are within treatment parameters per M.D. - pt may proceed with treatment.  Tx change to FOLFIRI/Avastin per MD d/t mild progression on recent CT. Hold Avastin today per MD. Primary RN and pharmacy notified.

## 2023-06-09 NOTE — Progress Notes (Signed)
DISCONTINUE ON PATHWAY REGIMEN - Colorectal     A cycle is every 14 days:     Bevacizumab-xxxx      Oxaliplatin      Leucovorin      Fluorouracil      Fluorouracil   **Always confirm dose/schedule in your pharmacy ordering system**  PRIOR TREATMENT: [id number]: mFOLFOX6 + Bevacizumab q14 Days  START ON PATHWAY REGIMEN - Colorectal     A cycle is every 14 days:     Bevacizumab-xxxx      Irinotecan      Leucovorin      Fluorouracil      Fluorouracil   **Always confirm dose/schedule in your pharmacy ordering system**  Patient Characteristics: Distant Metastases, Nonsurgical Candidate, KRAS/NRAS Wild-Type (BRAF V600 Wild-Type/Unknown), Standard Cytotoxic Therapy, Second Line Standard Cytotoxic Therapy, Bevacizumab Eligible Tumor Location: Colon Therapeutic Status: Distant Metastases Microsatellite/Mismatch Repair Status: MSS/pMMR BRAF Mutation Status: Wild-Type (no mutation) KRAS/NRAS Mutation Status: Wild-Type (no mutation) Preferred Therapy Approach: Standard Cytotoxic Therapy Standard Cytotoxic Line of Therapy: Second Line Standard Cytotoxic Therapy Bevacizumab Eligibility: Eligible Intent of Therapy: Non-Curative / Palliative Intent, Discussed with Patient

## 2023-06-10 ENCOUNTER — Other Ambulatory Visit: Payer: Self-pay

## 2023-06-11 ENCOUNTER — Inpatient Hospital Stay: Payer: No Typology Code available for payment source

## 2023-06-11 VITALS — BP 148/85 | HR 80 | Temp 97.6°F | Resp 18

## 2023-06-11 DIAGNOSIS — C189 Malignant neoplasm of colon, unspecified: Secondary | ICD-10-CM

## 2023-06-11 DIAGNOSIS — Z5111 Encounter for antineoplastic chemotherapy: Secondary | ICD-10-CM | POA: Diagnosis not present

## 2023-06-11 MED ORDER — SODIUM CHLORIDE 0.9% FLUSH
10.0000 mL | INTRAVENOUS | Status: DC | PRN
  Administered 2023-06-11: 10 mL

## 2023-06-11 MED ORDER — HEPARIN SOD (PORK) LOCK FLUSH 100 UNIT/ML IV SOLN
500.0000 [IU] | Freq: Once | INTRAVENOUS | Status: AC | PRN
Start: 2023-06-11 — End: 2023-06-11
  Administered 2023-06-11: 500 [IU]

## 2023-06-11 NOTE — Progress Notes (Signed)
Patients port flushed without difficulty.  Good blood return noted with no bruising or swelling noted at site.  Home infusion 5FU pump disconnected with no issues.  Band aid applied.  VSS with discharge and left in satisfactory condition with no s/s of distress noted.   

## 2023-06-11 NOTE — Patient Instructions (Signed)

## 2023-06-23 ENCOUNTER — Other Ambulatory Visit: Payer: Self-pay

## 2023-06-29 ENCOUNTER — Inpatient Hospital Stay: Payer: No Typology Code available for payment source

## 2023-06-29 ENCOUNTER — Other Ambulatory Visit: Payer: Self-pay | Admitting: *Deleted

## 2023-06-29 ENCOUNTER — Inpatient Hospital Stay (HOSPITAL_BASED_OUTPATIENT_CLINIC_OR_DEPARTMENT_OTHER): Payer: No Typology Code available for payment source | Admitting: Hematology

## 2023-06-29 VITALS — Wt 180.2 lb

## 2023-06-29 VITALS — BP 130/74 | HR 73 | Temp 96.4°F | Resp 20

## 2023-06-29 VITALS — BP 148/80 | HR 81 | Temp 97.7°F | Resp 18

## 2023-06-29 DIAGNOSIS — Z5111 Encounter for antineoplastic chemotherapy: Secondary | ICD-10-CM | POA: Diagnosis not present

## 2023-06-29 DIAGNOSIS — R809 Proteinuria, unspecified: Secondary | ICD-10-CM

## 2023-06-29 DIAGNOSIS — C189 Malignant neoplasm of colon, unspecified: Secondary | ICD-10-CM

## 2023-06-29 DIAGNOSIS — F419 Anxiety disorder, unspecified: Secondary | ICD-10-CM

## 2023-06-29 DIAGNOSIS — Z95828 Presence of other vascular implants and grafts: Secondary | ICD-10-CM

## 2023-06-29 LAB — COMPREHENSIVE METABOLIC PANEL
ALT: 26 U/L (ref 0–44)
AST: 42 U/L — ABNORMAL HIGH (ref 15–41)
Albumin: 3.1 g/dL — ABNORMAL LOW (ref 3.5–5.0)
Alkaline Phosphatase: 166 U/L — ABNORMAL HIGH (ref 38–126)
Anion gap: 11 (ref 5–15)
BUN: 13 mg/dL (ref 8–23)
CO2: 20 mmol/L — ABNORMAL LOW (ref 22–32)
Calcium: 9 mg/dL (ref 8.9–10.3)
Chloride: 103 mmol/L (ref 98–111)
Creatinine, Ser: 0.92 mg/dL (ref 0.44–1.00)
GFR, Estimated: >60 mL/min (ref >60)
Glucose, Bld: 214 mg/dL — ABNORMAL HIGH (ref 70–99)
Potassium: 3.3 mmol/L — ABNORMAL LOW (ref 3.5–5.1)
Sodium: 134 mmol/L — ABNORMAL LOW (ref 135–145)
Total Bilirubin: 1 mg/dL (ref 0.0–1.2)
Total Protein: 7.6 g/dL (ref 6.5–8.1)

## 2023-06-29 LAB — URINALYSIS, DIPSTICK ONLY
Bilirubin Urine: NEGATIVE
Glucose, UA: NEGATIVE mg/dL
Ketones, ur: NEGATIVE mg/dL
Leukocytes,Ua: NEGATIVE
Nitrite: NEGATIVE
Protein, ur: 100 mg/dL — AB
Specific Gravity, Urine: 1.015 (ref 1.005–1.030)
pH: 5 (ref 5.0–8.0)

## 2023-06-29 LAB — CBC WITH DIFFERENTIAL/PLATELET
Abs Immature Granulocytes: 0.03 10*3/uL (ref 0.00–0.07)
Basophils Absolute: 0 10*3/uL (ref 0.0–0.1)
Basophils Relative: 1 %
Eosinophils Absolute: 0.1 10*3/uL (ref 0.0–0.5)
Eosinophils Relative: 3 %
HCT: 38.6 % (ref 36.0–46.0)
Hemoglobin: 12.8 g/dL (ref 12.0–15.0)
Immature Granulocytes: 1 %
Lymphocytes Relative: 28 %
Lymphs Abs: 1.2 10*3/uL (ref 0.7–4.0)
MCH: 31.8 pg (ref 26.0–34.0)
MCHC: 33.2 g/dL (ref 30.0–36.0)
MCV: 96 fL (ref 80.0–100.0)
Monocytes Absolute: 0.8 10*3/uL (ref 0.1–1.0)
Monocytes Relative: 19 %
Neutro Abs: 2 10*3/uL (ref 1.7–7.7)
Neutrophils Relative %: 48 %
Platelets: 166 10*3/uL (ref 150–400)
RBC: 4.02 MIL/uL (ref 3.87–5.11)
RDW: 18.3 % — ABNORMAL HIGH (ref 11.5–15.5)
WBC: 4.2 10*3/uL (ref 4.0–10.5)
nRBC: 0 % (ref 0.0–0.2)

## 2023-06-29 LAB — MAGNESIUM: Magnesium: 2 mg/dL (ref 1.7–2.4)

## 2023-06-29 MED ORDER — HEPARIN SOD (PORK) LOCK FLUSH 100 UNIT/ML IV SOLN
500.0000 [IU] | Freq: Once | INTRAVENOUS | Status: DC | PRN
Start: 2023-06-29 — End: 2023-06-29

## 2023-06-29 MED ORDER — SODIUM CHLORIDE 0.9 % IV SOLN
1920.0000 mg/m2 | INTRAVENOUS | Status: DC
  Administered 2023-06-29: 3500 mg via INTRAVENOUS
  Filled 2023-06-29: qty 70

## 2023-06-29 MED ORDER — DEXAMETHASONE SODIUM PHOSPHATE 10 MG/ML IJ SOLN
10.0000 mg | Freq: Once | INTRAMUSCULAR | Status: AC
  Administered 2023-06-29: 10 mg via INTRAVENOUS
  Filled 2023-06-29: qty 1

## 2023-06-29 MED ORDER — SODIUM CHLORIDE 0.9 % IV SOLN: INTRAVENOUS | Status: DC

## 2023-06-29 MED ORDER — HEPARIN SOD (PORK) LOCK FLUSH 100 UNIT/ML IV SOLN
250.0000 [IU] | Freq: Once | INTRAVENOUS | Status: DC | PRN
Start: 2023-06-29 — End: 2023-06-29

## 2023-06-29 MED ORDER — ATROPINE SULFATE 1 MG/ML IV SOLN
0.5000 mg | Freq: Once | INTRAVENOUS | Status: AC
  Administered 2023-06-29: 0.5 mg via INTRAVENOUS
  Filled 2023-06-29: qty 1

## 2023-06-29 MED ORDER — SODIUM CHLORIDE 0.9% FLUSH: 10.0000 mL | INTRAVENOUS | Status: DC | PRN

## 2023-06-29 MED ORDER — CLONAZEPAM 1 MG PO TABS: 1.0000 mg | ORAL_TABLET | Freq: Three times a day (TID) | ORAL | 3 refills | Status: DC

## 2023-06-29 MED ORDER — SODIUM CHLORIDE 0.9 % IV SOLN: 10.0000 mg | Freq: Once | INTRAVENOUS | Status: DC

## 2023-06-29 MED ORDER — SODIUM CHLORIDE 0.9% FLUSH
3.0000 mL | INTRAVENOUS | Status: DC | PRN
Start: 2023-06-29 — End: 2023-06-29

## 2023-06-29 MED ORDER — FLUOROURACIL CHEMO INJECTION 2.5 GM/50ML
320.0000 mg/m2 | Freq: Once | INTRAVENOUS | Status: AC
  Administered 2023-06-29: 600 mg via INTRAVENOUS
  Filled 2023-06-29: qty 12

## 2023-06-29 MED ORDER — SODIUM CHLORIDE FLUSH 0.9 % IV SOLN
10.0000 mL | Freq: Once | INTRAVENOUS | Status: AC
Start: 2023-06-29 — End: 2023-06-29
  Administered 2023-06-29: 10 mL via INTRAVENOUS
  Filled 2023-06-29: qty 10

## 2023-06-29 MED ORDER — BLOOD GLUCOSE MONITOR KIT: PACK | 6 refills | Status: AC

## 2023-06-29 MED ORDER — SODIUM CHLORIDE 0.9 % IV SOLN
320.0000 mg/m2 | Freq: Once | INTRAVENOUS | Status: AC
  Administered 2023-06-29: 608 mg via INTRAVENOUS
  Filled 2023-06-29: qty 30.4

## 2023-06-29 MED ORDER — SODIUM CHLORIDE 0.9 % IV SOLN
144.0000 mg/m2 | Freq: Once | INTRAVENOUS | Status: AC
  Administered 2023-06-29: 300 mg via INTRAVENOUS
  Filled 2023-06-29: qty 15

## 2023-06-29 MED ORDER — BEVACIZUMAB-AWWB CHEMO INJECTION 400 MG/16ML
5.0000 mg/kg | Freq: Once | INTRAVENOUS | Status: AC
  Administered 2023-06-29: 400 mg via INTRAVENOUS
  Filled 2023-06-29: qty 16

## 2023-06-29 MED ORDER — PALONOSETRON HCL INJECTION 0.25 MG/5ML
0.2500 mg | Freq: Once | INTRAVENOUS | Status: AC
  Administered 2023-06-29: 0.25 mg via INTRAVENOUS
  Filled 2023-06-29: qty 5

## 2023-06-29 MED ORDER — ALTEPLASE 2 MG IJ SOLR: 2.0000 mg | Freq: Once | INTRAMUSCULAR | Status: DC | PRN

## 2023-06-29 NOTE — Patient Instructions (Signed)

## 2023-06-29 NOTE — Progress Notes (Signed)
 Patient tolerated chemotherapy with no complaints voiced.  Side effects with management reviewed with understanding verbalized.  Port site clean and dry with no bruising or swelling noted at site.  Good blood return noted before and after administration of chemotherapy.  Chemotherapy pump connected with no alarms noted.   Patient left in satisfactory condition with VSS and no s/s of distress noted.

## 2023-06-29 NOTE — Progress Notes (Signed)
 Ok to proceed without CMP resulted - lab machine is down for now.  Ok to treat with bevacizumab with urine protein 100   V.O. Dr Carilyn Goodpasture, PharmD

## 2023-06-29 NOTE — Progress Notes (Signed)
 [facility] [address] [place] [address]    Clinic Day:  07/01/2023  Referring physician: Rogers Hai, MD  Patient Care Team: Rogers Hai, MD as PCP - General (Hematology) Celestia Joesph SQUIBB, RN as Oncology Nurse Navigator (Oncology)   ASSESSMENT & PLAN:   Assessment: 1.  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 -CT chest on 05/02/2020 with no evidence of pulmonary metastatic disease. -CTAP on 05/07/2020 with multiple lesions throughout the liver, largest in the right lobe measuring 3.9 cm, lateral segment left lobe measuring 2.6 cm and inferior right lobe confluent lesion 4.2 x 3.7 cm.  No lymphadenopathy. -CEA on 05/02/2020-60.2. -FOLFOX and bevacizumab  started on 06/04/2020. 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 - FOLFOX with bevacizumab  from 03/10/2023 through 05/24/2023 with progression. - FOLFIRI and bevacizumab  started on 06/09/2023   2.  Iron deficiency anemia: -Feraheme  on 04/30/2020 and 05/06/2020.   3.  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 -Smoked for 3 to 4 years and quit. -Mother had cancer, type unknown.  Maternal uncle had lung cancer.  Maternal aunt had lung cancer.    Plan: 1.  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  CEA was going up. - She received first cycle of FOLFIRI  on 06/09/2023.  She felt wobbly for a day after treatment.  She had mild diarrhea but no abdominal cramping. - Labs today: AST 42 and improved from 47 previously.  Bilirubin normal.  CBC was normal.  UA showed protein of  100. - We will proceed with cycle 2 of FOLFIRI today.  We will add bevacizumab  today as protein improved to 100.  If the protein goes back up again, consider 24-hour urine for total protein. - RTC 2 weeks for follow-up.   2.  Hypertension: - Continue Norvasc  10 mg daily and triamterene /HCTZ daily.  Blood pressure is 130/74.   3.  Severe hypokalemia: - Continue potassium 40 mill equivalents daily.  Potassium is 3.3 today.   4.  Peripheral neuropathy: - Continue gabapentin  100 mg 3 times daily.  Tingling in the fingertips is stable.   5.  Anxiety: - May increase Klonopin  to 1 mg 3 times daily.  Continue Paxil  40 mg daily.  6.  Newly diagnosed diabetes: - HbA1c on 05/05/2023 was 7.4. - At last visit I started her on glimepiride  1 mg tablet daily with breakfast.  Blood sugar today is 214. - Will prescribe glucometer and recommend her to check fasting blood sugars at home.     Orders Placed This Encounter  Procedures   Urinalysis, dipstick only      I,Helena R Teague,acting as a scribe for Hai Rogers, MD.,have documented all relevant documentation on the behalf of Hai Rogers, MD,as directed by  Hai Rogers, MD while in the presence of Hai Rogers, MD.  I, Hai Rogers MD, have reviewed the above documentation for accuracy and completeness, and I agree with the above.    Hai Rogers, MD   1/2/20258:30 AM  CHIEF COMPLAINT:   Diagnosis: metastatic colon cancer to liver    Cancer Staging  No matching staging information was found for the patient.    Prior  Therapy: 1. Sigmoid colectomy and end colostomy on 04/29/2020  2. FOLFOX with bevacizumab , 06/04/20 - 11/06/20 3. Maintenance 5-FU and bevacizumab , 11/20/20 - 02/17/23  Current Therapy:  FOLFOX with bevacizumab    HISTORY OF PRESENT ILLNESS:   Oncology History  Metastatic colon cancer to liver (HCC)  05/16/2020 Initial Diagnosis   Metastatic colon cancer to liver (HCC)    06/03/2020 Genetic Testing   Foundation One:     06/04/2020 - 02/25/2022 Chemotherapy   Patient is on Treatment Plan : COLORECTAL FOLFOX + Bevacizumab  q14d     06/04/2020 - 05/26/2023 Chemotherapy   Patient is on Treatment Plan : COLORECTAL FOLFOX + Bevacizumab  q14d     06/09/2023 -  Chemotherapy   Patient is on Treatment Plan : COLORECTAL FOLFIRI + Bevacizumab  q14d        INTERVAL HISTORY:   Susan Martin is a 62 y.o. female presenting to clinic today for follow up of metastatic colon cancer to liver. She was last seen by me on 06/09/23.  Today, she states that she is doing well overall. Her appetite level is at 100%. Her energy level is at 50%.  She notes a normal appetite. She reports she was dizzy 1 day after her last treatment and had mild diarrhea. Neuropathy in fingertips is stable. She denies any cramping. She is tolerating glimepiride  and is taking as prescribed. She does not check her blood sugar at home. She has been referred to diabetic education nurse and a PCP. She is taking potassium as prescribed.   She notes anxiety is worsened in the afternoon. She reports she only takes clonazepam  twice a day, once in the morning and once at night.   PAST MEDICAL HISTORY:   Past Medical History: Past Medical History:  Diagnosis Date   Adenocarcinoma of colon metastatic to liver [facility]) onocology--- dr s. rogers   04-29-2020 emerge[facility] surgery for perforated colon s/p sigmoid colectomy w/ colostomy; dx  Stage IV colon cancer mets to liver   Anxiety    Depression    Hypertension    followed by dr edsel and oncology  (05-27-2020 per pt does not have pcp yet)   IDA (iron deficiency anemia)    Pulmonary nodule, right     Surgical History: Past Surgical History:  Procedure Laterality Date   ABDOMINAL HYSTERECTOMY  03-31-2016   @[facility]    W/  BILATERAL SALPINOOPHORECTOMY    APPLICATION OF WOUND VAC N/A 04/29/2020   Procedure: APPLICATION OF WOUND VAC;  Surgeon: Stevie Herlene Righter,  MD;  Location: [facility] OR;  Service: General;  Laterality: N/A;   ENDOMETRIAL ABLATION     LAPAROTOMY N/A 04/29/2020   Procedure: EXPLORATORY LAPAROTOMY;  Surgeon: Stevie Herlene Righter, MD;  Location: [facility] OR;  Service: General;  Laterality: N/A;   PARTIAL COLECTOMY N/A 04/29/2020   Procedure: PARTIAL COLECTOMY WITH END COLOSTOMY;  Surgeon: Stevie Herlene Righter, MD;  Location: [facility] OR;  Service: General;  Laterality: N/A;   PORTACATH PLACEMENT Right 05/28/2020   Procedure: INSERTION PORT-A-CATH;  Surgeon: Kinsinger, Herlene Righter, MD;  Location: [facility];  Service: General;  Laterality: Right;   SALPINGOOPHORECTOMY Left 03/31/2016   Procedure: LEFT SALPINGO OOPHORECTOMY WITH FROZEN SECTION,  ABDOMINAL HYSTERECTOMY WITH BILATERAL SALPINGO-OOPHORECTOMY;  Surgeon: Norleen LULLA Edsel, MD;  Location: [facility];  Service: Gynecology;  Laterality: Left;    Social History: Social History   Socioeconomic History   Marital status: Divorced    Spouse name: Not on file   Number of children: Not on file  Years of education: Not on file   Highest education level: Not on file  Occupational History   Not on file  Tobacco Use   Smoking status: Former    Current packs/day: 0.00    Average packs/day: 0.5 packs/day for 5.0 years (2.5 ttl pk-yrs)    Types: Cigarettes    Start date: 03/27/2010    Quit date: 03/28/2015    Years since quitting: 8.2   Smokeless tobacco: Never  Vaping Use   Vaping status: Never Used  Substance and Sexual Activity   Alcohol use: No   Drug use: Never   Sexual activity: Yes    Partners: Male    Birth control/protection: Surgical  Other Topics Concern   Not on file  Social History Narrative   Not on file   Social Drivers of Health   Financial Resource Strain: Low Risk  (11/01/2019)   Overall Financial Resource Strain (CARDIA)    Difficulty of Paying Living Expenses: Not very hard  Food Insecurity: No Food Insecurity (11/01/2019)   Hunger Vital Sign    Worried About  Running Out of Food in the Last Year: Never true    Ran Out of Food in the Last Year: Never true  Transportation Needs: No Transportation Needs (11/01/2019)   PRAPARE - Administrator, Civil Service (Medical): No    Lack of Transportation (Non-Medical): No  Physical Activity: Insufficiently Active (11/01/2019)   Exercise Vital Sign    Days of Exercise per Week: 2 days    Minutes of Exercise per Session: 30 min  Stress: Stress Concern Present (11/01/2019)   [employer] of Occupational Health - Occupational Stress Questionnaire    Feeling of Stress : To some extent  Social Connections: Moderately Integrated (11/01/2019)   Social Connection and Isolation Panel [NHANES]    Frequency of Communication with Friends and Family: More than three times a week    Frequency of Social Gatherings with Friends and Family: Once a week    Attends Religious Services: More than 4 times per year    Active Member of [employer] or Organizations: No    Attends Banker Meetings: Never    Marital Status: Living with partner  Intimate Partner Violence: Not At Risk (11/01/2019)   Humiliation, Afraid, Rape, and Kick questionnaire    Fear of Current or Ex-Partner: No    Emotionally Abused: No    Physically Abused: No    Sexually Abused: No    Family History: Family History  Problem Relation Age of Onset   Hypertension Mother    Diabetes Mother    Cirrhosis Father     Current Medications:  Current Outpatient Medications:    amLODipine  (NORVASC ) 10 MG tablet, TAKE 1 TABLET BY MOUTH EVERY DAY, Disp: 90 tablet, Rfl: 2   amLODipine  (NORVASC ) 5 MG tablet, TAKE 1 TABLET (5 MG TOTAL) BY MOUTH DAILY., Disp: 90 tablet, Rfl: 1   BEVACIZUMAB  IV, Inject 5 mg/kg into the vein every 14 (fourteen) days., Disp: , Rfl:    fluorouracil  CALGB 19297 in sodium chloride  0.9 % 150 mL, Inject 2,400 mg/m2 into the vein over 48 hr., Disp: , Rfl:    gabapentin  (NEURONTIN ) 100 MG capsule, TAKE 1 CAPSULE (100 MG  TOTAL) BY MOUTH 3 TIMES A DAY, Disp: 90 capsule, Rfl: 3   glimepiride  (AMARYL ) 1 MG tablet, Take 1 tablet (1 mg total) by mouth daily with breakfast., Disp: 30 tablet, Rfl: 1   LEUCOVORIN  CALCIUM  IV, Inject 400 mg/m2 into  the vein every 21 ( twenty-one) days., Disp: , Rfl:    lidocaine  (XYLOCAINE ) 2 % solution, Use as directed 15 mLs in the mouth or throat every 6 (six) hours as needed for mouth pain. Swish and spit/swallow every six hours as needed for mouth pain, Disp: 480 mL, Rfl: 3   lidocaine -prilocaine  (EMLA ) cream, Apply 1 Application topically as needed., Disp: 30 g, Rfl: 0   nystatin (MYCOSTATIN) 100000 UNIT/ML suspension, Take by mouth., Disp: , Rfl:    OXALIPLATIN  IV, Inject into the vein every 14 (fourteen) days., Disp: , Rfl:    oxyCODONE  (OXY IR/ROXICODONE ) 5 MG immediate release tablet, Take 5 mg by mouth every 6 (six) hours as needed. for pain, Disp: , Rfl:    PARoxetine  (PAXIL ) 40 MG tablet, Take 1 tablet (40 mg total) by mouth every morning., Disp: 90 tablet, Rfl: 3   polyethylene glycol (MIRALAX  / GLYCOLAX ) packet, Take 17 g by mouth every other day., Disp: , Rfl:    potassium chloride  (KLOR-CON  M10) 10 MEQ tablet, Take 2 tablets (20 mEq total) by mouth daily., Disp: 180 tablet, Rfl: 3   triamterene -hydrochlorothiazide  (MAXZIDE -25) 37.5-25 MG tablet, TAKE 1 TABLET BY MOUTH EVERY DAY, Disp: 90 tablet, Rfl: 1   blood glucose meter kit and supplies KIT, Dispense based on patient and insurance preference. Use up to four times daily as directed., Disp: 1 each, Rfl: 6   clonazePAM  (KLONOPIN ) 1 MG tablet, Take 1 tablet (1 mg total) by mouth in the morning, at noon, and at bedtime., Disp: 90 tablet, Rfl: 3   Allergies: No Known Allergies  REVIEW OF SYSTEMS:   Review of Systems  Constitutional:  Negative for chills, fatigue and fever.  HENT:   Negative for lump/mass, mouth sores, nosebleeds, sore throat and trouble swallowing.   Eyes:  Negative for eye problems.  Respiratory:   Positive for shortness of breath (with exertion). Negative for cough.   Cardiovascular:  Negative for chest pain, leg swelling and palpitations.  Gastrointestinal:  Negative for abdominal pain, constipation, diarrhea, nausea and vomiting.  Genitourinary:  Negative for bladder incontinence, difficulty urinating, dysuria, frequency, hematuria and nocturia.   Musculoskeletal:  Negative for arthralgias, back pain, flank pain, myalgias and neck pain.  Skin:  Negative for itching and rash.  Neurological:  Positive for numbness (in hands). Negative for dizziness and headaches.  Hematological:  Does not bruise/bleed easily.  Psychiatric/Behavioral:  Positive for depression and sleep disturbance. Negative for suicidal ideas. The patient is not nervous/anxious.   All other systems reviewed and are negative.    VITALS:   Weight 180 lb 3.2 oz (81.7 kg).  Wt Readings from Last 3 Encounters:  06/29/23 180 lb 3.2 oz (81.7 kg)  06/09/23 181 lb 6.4 oz (82.3 kg)  05/24/23 181 lb 6.4 oz (82.3 kg)    Body mass index is 31.92 kg/m.  Performance status (ECOG): 1 - Symptomatic but completely ambulatory  PHYSICAL EXAM:   Physical Exam Vitals and nursing note reviewed. Exam conducted with a chaperone present.  Constitutional:      Appearance: Normal appearance.  Cardiovascular:     Rate and Rhythm: Normal rate and regular rhythm.     Pulses: Normal pulses.     Heart sounds: Normal heart sounds.  Pulmonary:     Effort: Pulmonary effort is normal.     Breath sounds: Normal breath sounds.  Abdominal:     Palpations: Abdomen is soft. There is no hepatomegaly, splenomegaly or mass.     Tenderness: There  is no abdominal tenderness.  Musculoskeletal:     Right lower leg: No edema.     Left lower leg: No edema.  Lymphadenopathy:     Cervical: No cervical adenopathy.     Right cervical: No superficial, deep or posterior cervical adenopathy.    Left cervical: No superficial, deep or posterior cervical  adenopathy.     Upper Body:     Right upper body: No supraclavicular or axillary adenopathy.     Left upper body: No supraclavicular or axillary adenopathy.  Neurological:     General: No focal deficit present.     Mental Status: She is alert and oriented to person, place, and time.  Psychiatric:        Mood and Affect: Mood normal.        Behavior: Behavior normal.     LABS:      Latest Ref Rng & Units 06/29/2023    7:51 AM 06/09/2023    8:40 AM 05/24/2023   10:18 AM  CBC  WBC 4.0 - 10.5 K/uL 4.2  4.5  4.7   Hemoglobin 12.0 - 15.0 g/dL 87.1  86.9  85.9   Hematocrit 36.0 - 46.0 % 38.6  39.7  41.3   Platelets 150 - 400 K/uL 166  129  153       Latest Ref Rng & Units 06/29/2023    7:51 AM 06/09/2023    8:40 AM 05/24/2023   10:18 AM  CMP  Glucose 70 - 99 mg/dL 785  723  734   BUN 8 - 23 mg/dL 13  13  12    Creatinine 0.44 - 1.00 mg/dL 9.07  9.16  9.09   Sodium 135 - 145 mmol/L 134  130  131   Potassium 3.5 - 5.1 mmol/L 3.3  3.4  3.6   Chloride 98 - 111 mmol/L 103  97  96   CO2 22 - 32 mmol/L 20  22  24    Calcium  8.9 - 10.3 mg/dL 9.0  8.9  8.9   Total Protein 6.5 - 8.1 g/dL 7.6  7.6  7.8   Total Bilirubin 0.0 - 1.2 mg/dL 1.0  1.0  1.4   Alkaline Phos 38 - 126 U/L 166  155  150   AST 15 - 41 U/L 42  47  39   ALT 0 - 44 U/L 26  27  25       Lab Results  Component Value Date   CEA1 27.5 (H) 05/24/2023   /  CEA  Date Value Ref Range Status  05/24/2023 27.5 (H) 0.0 - 4.7 ng/mL Final    Comment:    (NOTE)                             Nonsmokers          <3.9                             Smokers             <5.6 Roche [employer]ostics Electrochemiluminescence Immunoassay (ECLIA) Values obtained with different assay methods or kits cannot be used interchangeably.  Results cannot be interpreted as absolute evidence of the presence or absence of malignant disease. Performed At: [facility] [address] [place], [place] [id number] Jennette Shorter MD  Ey:[phone]    No results found for: PSA1 No results found for: CAN199 No results  found for: CAN125  No results found for: TOTALPROTELP, ALBUMINELP, A1GS, A2GS, BETS, BETA2SER, GAMS, MSPIKE, SPEI Lab Results  Component Value Date   TIBC 248 (L) 04/29/2020   FERRITIN 57 04/29/2020   IRONPCTSAT 3 (L) 04/29/2020   No results found for: LDH   STUDIES:   CT CHEST ABDOMEN PELVIS W CONTRAST Result Date: 06/07/2023 CLINICAL DATA:  Metastatic colon cancer restaging * Tracking Code: BO * EXAM: CT CHEST, ABDOMEN, AND PELVIS WITH CONTRAST TECHNIQUE: Multidetector CT imaging of the chest, abdomen and pelvis was performed following the standard protocol during bolus administration of intravenous contrast. RADIATION DOSE REDUCTION: This exam was performed according to the departmental dose-optimization program which includes automated exposure control, adjustment of the mA and/or kV according to patient size and/or use of iterative reconstruction technique. CONTRAST:  OMNIPAQUE  IOHEXOL  300 MG/ML SOLN additional oral enteric contrast COMPARISON:  02/25/2023 FINDINGS: CT CHEST FINDINGS Cardiovascular: Right chest port catheter. Normal heart size. No pericardial effusion. Mediastinum/Nodes: No enlarged mediastinal, hilar, or axillary lymph nodes. Thyroid gland, trachea, and esophagus demonstrate no significant findings. Lungs/Pleura: Numerous bilateral pulmonary nodules, new and enlarged compared to prior examination, index nodule in the anterior right apex measuring 0.8 x 0.7 cm, previously no greater than 0.4 cm (series 4, image 37), index nodule in the medial right lower lobe measuring 1.2 x 0.9 cm, previously 0.5 cm (series 4, image 93). No pleural effusion or pneumothorax. Musculoskeletal: No chest wall abnormality. No acute osseous findings. CT ABDOMEN PELVIS FINDINGS Hepatobiliary: Somewhat coarse contour of the liver. Several very ill-defined liver metastases, some of which  are not appreciably changed, others of which appear to have slightly enlarged, for example in the superior left lobe of the liver, hepatic segment II measuring 2.7 x 2.2 cm, previously 1.9 x 0.9 cm (series 2, image 49) and adjacent to the gallbladder fossa, hepatic segment V measuring 2.8 x 1.8 cm, previously 2.8 x 1.8 cm (series 2, image 65)., very ill-defined lesion in the posterior liver dome, hepatic segment VIII measures 3.6 x 2.6 cm, previously 3.1 x 2.4 cm when measured similarly (series 2, image 54). No gallstones, gallbladder wall thickening, or biliary dilatation. Pancreas: Unremarkable. No pancreatic ductal dilatation or surrounding inflammatory changes. Spleen: Splenomegaly, maximum coronal span 14.2 cm. Adrenals/Urinary Tract: Adrenal glands are unremarkable. Kidneys are normal, without renal calculi, solid lesion, or hydronephrosis. Bladder is unremarkable. Stomach/Bowel: Stomach is within normal limits. Status post sigmoid colon resection with rectal stump and left lower quadrant end colostomy. Vascular/Lymphatic: Aortic atherosclerosis. No enlarged abdominal or pelvic lymph nodes. Reproductive: Status post hysterectomy. Other: Left lower quadrant end colostomy with a fat containing parastomal hernia (series 2, image 84). No ascites. Unchanged stranding and nodularity throughout the peritoneum and omentum, without discretely measurable nodules (series 2, image 81). Small incidental splenule in the left upper quadrant which is not reflect peritoneal metastatic disease (series 2, image 66). Musculoskeletal: No acute osseous findings. IMPRESSION: 1. Numerous bilateral pulmonary nodules, new and enlarged compared to prior examination, consistent with worsened pulmonary metastatic disease. 2. Several very ill-defined liver metastases, some of which are not appreciably changed, others of which appear to have slightly enlarged. Findings are most consistent with slight overall progression of hepatic  metastatic disease. Given ill-defined appearance of the lesions by CT, MRI may be helpful for future assessment of liver disease, if desired. 3. Unchanged stranding and nodularity throughout the peritoneum and omentum, consistent with peritoneal metastatic disease, without discretely measurable nodules. 4. Somewhat coarse contour of the liver, likely  reflecting some degree of pseudocirrhosis in the setting of underlying treated hepatic metastatic disease. Correlate for biochemical evidence of cirrhosis. 5. Status post sigmoid colon resection with rectal stump and left lower quadrant end colostomy. 6. Splenomegaly. Aortic Atherosclerosis (ICD10-I70.0). Electronically Signed   By: Marolyn JONETTA Jaksch M.D.   On: 06/07/2023 07:24

## 2023-06-29 NOTE — Patient Instructions (Signed)
 [facility] [place] - A DEPT OF [facility]  Discharge Instructions: Thank you for choosing [facility] to provide your oncology and hematology care.  If you have a lab appointment with the Cancer Center - please note that after April 8th, 2024, all labs will be drawn in the cancer center.  You do not have to check in or register with the main entrance as you have in the past but will complete your check-in in the cancer center.  Wear comfortable clothing and clothing appropriate for easy access to any Portacath or PICC line.   We strive to give you quality time with your provider. You may need to reschedule your appointment if you arrive late (15 or more minutes).  Arriving late affects you and other patients whose appointments are after yours.  Also, if you miss three or more appointments without notifying the office, you may be dismissed from the clinic at the provider's discretion.      For prescription refill requests, have your pharmacy contact our office and allow 72 hours for refills to be completed.    Today you received the following chemotherapy and/or immunotherapy agents MVASI , Irinotecan , Fluorouracil .       To help prevent nausea and vomiting after your treatment, we encourage you to take your nausea medication as directed.  BELOW ARE SYMPTOMS THAT SHOULD BE REPORTED IMMEDIATELY: *FEVER GREATER THAN 100.4 F (38 C) OR HIGHER *CHILLS OR SWEATING *NAUSEA AND VOMITING THAT IS NOT CONTROLLED WITH YOUR NAUSEA MEDICATION *UNUSUAL SHORTNESS OF BREATH *UNUSUAL BRUISING OR BLEEDING *URINARY PROBLEMS (pain or burning when urinating, or frequent urination) *BOWEL PROBLEMS (unusual diarrhea, constipation, pain near the anus) TENDERNESS IN MOUTH AND THROAT WITH OR WITHOUT PRESENCE OF ULCERS (sore throat, sores in mouth, or a toothache) UNUSUAL RASH, SWELLING OR PAIN  UNUSUAL VAGINAL DISCHARGE OR ITCHING   Items with * indicate a potential emergency  and should be followed up as soon as possible or go to the Emergency Department if any problems should occur.  Please show the CHEMOTHERAPY ALERT CARD or IMMUNOTHERAPY ALERT CARD at check-in to the Emergency Department and triage nurse.  Should you have questions after your visit or need to cancel or reschedule your appointment, please contact [facility] [place] - A DEPT OF [facility] [phone]  and follow the prompts.  Office hours are 8:00 a.m. to 4:30 p.m. Monday - Friday. Please note that voicemails left after 4:00 p.m. may not be returned until the following business day.  We are closed weekends and major holidays. You have access to a nurse at all times for urgent questions. Please call the main number to the clinic [phone] and follow the prompts.  For any non-urgent questions, you may also contact your provider using MyChart. We now offer e-Visits for anyone 73 and older to request care online for non-urgent symptoms. For details visit [email or web address].   Also download the MyChart app! Go to the app store, search MyChart, open the app, select [place], and log in with your MyChart username and password.

## 2023-06-30 ENCOUNTER — Other Ambulatory Visit: Payer: Self-pay

## 2023-07-01 ENCOUNTER — Inpatient Hospital Stay: Payer: No Typology Code available for payment source | Admitting: Dietician

## 2023-07-01 ENCOUNTER — Other Ambulatory Visit: Payer: Self-pay | Admitting: Hematology

## 2023-07-01 ENCOUNTER — Encounter: Payer: Self-pay | Admitting: Hematology

## 2023-07-01 ENCOUNTER — Inpatient Hospital Stay: Payer: No Typology Code available for payment source | Attending: Hematology

## 2023-07-01 VITALS — BP 133/72 | HR 91 | Temp 98.1°F | Resp 18

## 2023-07-01 DIAGNOSIS — D509 Iron deficiency anemia, unspecified: Secondary | ICD-10-CM | POA: Insufficient documentation

## 2023-07-01 DIAGNOSIS — Z79899 Other long term (current) drug therapy: Secondary | ICD-10-CM | POA: Insufficient documentation

## 2023-07-01 DIAGNOSIS — Z7984 Long term (current) use of oral hypoglycemic drugs: Secondary | ICD-10-CM | POA: Insufficient documentation

## 2023-07-01 DIAGNOSIS — Z87891 Personal history of nicotine dependence: Secondary | ICD-10-CM | POA: Diagnosis not present

## 2023-07-01 DIAGNOSIS — Z801 Family history of malignant neoplasm of trachea, bronchus and lung: Secondary | ICD-10-CM | POA: Diagnosis not present

## 2023-07-01 DIAGNOSIS — Z5111 Encounter for antineoplastic chemotherapy: Secondary | ICD-10-CM | POA: Diagnosis not present

## 2023-07-01 DIAGNOSIS — Z809 Family history of malignant neoplasm, unspecified: Secondary | ICD-10-CM | POA: Insufficient documentation

## 2023-07-01 DIAGNOSIS — Z933 Colostomy status: Secondary | ICD-10-CM | POA: Insufficient documentation

## 2023-07-01 DIAGNOSIS — F419 Anxiety disorder, unspecified: Secondary | ICD-10-CM | POA: Insufficient documentation

## 2023-07-01 DIAGNOSIS — C787 Secondary malignant neoplasm of liver and intrahepatic bile duct: Secondary | ICD-10-CM | POA: Diagnosis not present

## 2023-07-01 DIAGNOSIS — I10 Essential (primary) hypertension: Secondary | ICD-10-CM | POA: Diagnosis not present

## 2023-07-01 DIAGNOSIS — R918 Other nonspecific abnormal finding of lung field: Secondary | ICD-10-CM | POA: Diagnosis not present

## 2023-07-01 DIAGNOSIS — C187 Malignant neoplasm of sigmoid colon: Secondary | ICD-10-CM | POA: Diagnosis not present

## 2023-07-01 DIAGNOSIS — E876 Hypokalemia: Secondary | ICD-10-CM | POA: Insufficient documentation

## 2023-07-01 DIAGNOSIS — E1142 Type 2 diabetes mellitus with diabetic polyneuropathy: Secondary | ICD-10-CM | POA: Diagnosis not present

## 2023-07-01 DIAGNOSIS — C189 Malignant neoplasm of colon, unspecified: Secondary | ICD-10-CM

## 2023-07-01 DIAGNOSIS — E119 Type 2 diabetes mellitus without complications: Secondary | ICD-10-CM

## 2023-07-01 MED ORDER — HEPARIN SOD (PORK) LOCK FLUSH 100 UNIT/ML IV SOLN
500.0000 [IU] | Freq: Once | INTRAVENOUS | Status: AC | PRN
  Administered 2023-07-01: 500 [IU]

## 2023-07-01 MED ORDER — SODIUM CHLORIDE 0.9% FLUSH
10.0000 mL | INTRAVENOUS | Status: DC | PRN
  Administered 2023-07-01: 10 mL

## 2023-07-01 NOTE — Patient Instructions (Signed)
 [facility] [place] - A DEPT OF [facility]  Discharge Instructions: Thank you for choosing [facility] to provide your oncology and hematology care.  If you have a lab appointment with the Cancer Center - please note that after April 8th, 2024, all labs will be drawn in the cancer center.  You do not have to check in or register with the main entrance as you have in the past but will complete your check-in in the cancer center.  Wear comfortable clothing and clothing appropriate for easy access to any Portacath or PICC line.   We strive to give you quality time with your provider. You may need to reschedule your appointment if you arrive late (15 or more minutes).  Arriving late affects you and other patients whose appointments are after yours.  Also, if you miss three or more appointments without notifying the office, you may be dismissed from the clinic at the provider's discretion.      For prescription refill requests, have your pharmacy contact our office and allow 72 hours for refills to be completed.    Today you received the following infusystem pump disconnect.    To help prevent nausea and vomiting after your treatment, we encourage you to take your nausea medication as directed.  BELOW ARE SYMPTOMS THAT SHOULD BE REPORTED IMMEDIATELY: *FEVER GREATER THAN 100.4 F (38 C) OR HIGHER *CHILLS OR SWEATING *NAUSEA AND VOMITING THAT IS NOT CONTROLLED WITH YOUR NAUSEA MEDICATION *UNUSUAL SHORTNESS OF BREATH *UNUSUAL BRUISING OR BLEEDING *URINARY PROBLEMS (pain or burning when urinating, or frequent urination) *BOWEL PROBLEMS (unusual diarrhea, constipation, pain near the anus) TENDERNESS IN MOUTH AND THROAT WITH OR WITHOUT PRESENCE OF ULCERS (sore throat, sores in mouth, or a toothache) UNUSUAL RASH, SWELLING OR PAIN  UNUSUAL VAGINAL DISCHARGE OR ITCHING   Items with * indicate a potential emergency and should be followed up as soon as possible or  go to the Emergency Department if any problems should occur.  Please show the CHEMOTHERAPY ALERT CARD or IMMUNOTHERAPY ALERT CARD at check-in to the Emergency Department and triage nurse.  Should you have questions after your visit or need to cancel or reschedule your appointment, please contact [facility] [place] - A DEPT OF [facility] [phone]  and follow the prompts.  Office hours are 8:00 a.m. to 4:30 p.m. Monday - Friday. Please note that voicemails left after 4:00 p.m. may not be returned until the following business day.  We are closed weekends and major holidays. You have access to a nurse at all times for urgent questions. Please call the main number to the clinic [phone] and follow the prompts.  For any non-urgent questions, you may also contact your provider using MyChart. We now offer e-Visits for anyone 15 and older to request care online for non-urgent symptoms. For details visit [email or web address].   Also download the MyChart app! Go to the app store, search MyChart, open the app, select [facility], and log in with your MyChart username and password.

## 2023-07-01 NOTE — Progress Notes (Signed)
 Patient presents today to have 5FU pump disconnected. Pump discconected without issue. Patients port flushed without difficulty.  Good blood return noted with no bruising or swelling noted at site.  Band aid applied.  VSS with discharge and left in satisfactory condition with no s/s of distress noted.

## 2023-07-01 NOTE — Progress Notes (Signed)
 Nutrition Assessment   Reason for Assessment: MD referral (new DM2)   ASSESSMENT: 63 year old female with metastatic colon cancer to liver. Folfox discontinued secondary to progression. She is currently receiving Folfiri + Bevacizumab  q14d (start 12/11). Patient is under the care of Dr. Rogers.  Past medical history includes HTN, peripheral neuropathy  Met with patient in clinic. She reports modifying diet since new diabetes diagnosis. Patient was referred to out patient diabetes education, however this appointment is not until April. Patient reports she is holding sweets and increasing fiber. She is eating 3 meals plus snacks. States appetite is great. Yesterday, she had boiled egg with oatmeal for breakfast, cup of greek yogurt for snack, humus with wheat thins at lunch, pork, black eyed peas, turnip salad for dinner. She drinks 3 diet cokes (which is less than usual), unsweetened green tea, and ~4 bottles of water . Patient denies nausea, vomiting, diarrhea, constipation. She has been taking glimepiride  as prescribed and tolerating well. Patient has not been checking blood sugars at home as she is awaiting for device to arrive at pharmacy.    Nutrition Focused Physical Exam: deferred    Medications: glimepiride , amlodipine , klonopin , gabapentin , oxycodone , paxil , klor-con , maxzide -25   Labs: 12/31 Na 134, K 3.3, glucose 214, albumin 3.1, alk phos 166, AST 42 HgbA1c 7.4 on 11/6   Anthropometrics:   Height: 5'3 Weight: 180 lb 3.2 oz UBW: 181-186 BMI: 31.92    NUTRITION DIAGNOSIS: Food and nutrition related knowledge deficit related to newly diagnosed diabetes as evidenced by no prior need for associated information   INTERVENTION:  Educated on strategies for blood glucose management. Discussed balanced meals and snacks and offered ideas Educated on sighs/symptoms of hyper/hypoglycemia Educated on empty calories in diet soda, pt is working to decrease this Pt has  implemented changes to diet (holding sweets, increasing fiber) Handout with education and balanced sample menu provided Contact information given    MONITORING, EVALUATION, GOAL: Patient will adhere to dietary recommendations to promote good glucose control   Next Visit: To be scheduled as needed

## 2023-07-06 DIAGNOSIS — C20 Malignant neoplasm of rectum: Secondary | ICD-10-CM | POA: Diagnosis not present

## 2023-07-14 ENCOUNTER — Inpatient Hospital Stay: Payer: No Typology Code available for payment source

## 2023-07-14 ENCOUNTER — Inpatient Hospital Stay (HOSPITAL_BASED_OUTPATIENT_CLINIC_OR_DEPARTMENT_OTHER): Payer: No Typology Code available for payment source | Admitting: Hematology

## 2023-07-14 VITALS — BP 158/80 | HR 79 | Temp 98.2°F | Resp 18

## 2023-07-14 VITALS — Wt 177.0 lb

## 2023-07-14 DIAGNOSIS — C787 Secondary malignant neoplasm of liver and intrahepatic bile duct: Secondary | ICD-10-CM | POA: Diagnosis not present

## 2023-07-14 DIAGNOSIS — C189 Malignant neoplasm of colon, unspecified: Secondary | ICD-10-CM | POA: Diagnosis not present

## 2023-07-14 DIAGNOSIS — C20 Malignant neoplasm of rectum: Secondary | ICD-10-CM | POA: Diagnosis not present

## 2023-07-14 DIAGNOSIS — Z5111 Encounter for antineoplastic chemotherapy: Secondary | ICD-10-CM | POA: Diagnosis not present

## 2023-07-14 LAB — CBC WITH DIFFERENTIAL/PLATELET
Abs Immature Granulocytes: 0.01 10*3/uL (ref 0.00–0.07)
Basophils Absolute: 0 10*3/uL (ref 0.0–0.1)
Basophils Relative: 1 %
Eosinophils Absolute: 0.1 10*3/uL (ref 0.0–0.5)
Eosinophils Relative: 3 %
HCT: 38.8 % (ref 36.0–46.0)
Hemoglobin: 12.6 g/dL (ref 12.0–15.0)
Immature Granulocytes: 0 %
Lymphocytes Relative: 31 %
Lymphs Abs: 1.3 10*3/uL (ref 0.7–4.0)
MCH: 31 pg (ref 26.0–34.0)
MCHC: 32.5 g/dL (ref 30.0–36.0)
MCV: 95.6 fL (ref 80.0–100.0)
Monocytes Absolute: 0.7 10*3/uL (ref 0.1–1.0)
Monocytes Relative: 16 %
Neutro Abs: 2 10*3/uL (ref 1.7–7.7)
Neutrophils Relative %: 49 %
Platelets: 149 10*3/uL — ABNORMAL LOW (ref 150–400)
RBC: 4.06 MIL/uL (ref 3.87–5.11)
RDW: 17.5 % — ABNORMAL HIGH (ref 11.5–15.5)
WBC: 4 10*3/uL (ref 4.0–10.5)
nRBC: 0 % (ref 0.0–0.2)

## 2023-07-14 LAB — COMPREHENSIVE METABOLIC PANEL
ALT: 28 U/L (ref 0–44)
AST: 47 U/L — ABNORMAL HIGH (ref 15–41)
Albumin: 3.4 g/dL — ABNORMAL LOW (ref 3.5–5.0)
Alkaline Phosphatase: 172 U/L — ABNORMAL HIGH (ref 38–126)
Anion gap: 10 (ref 5–15)
BUN: 15 mg/dL (ref 8–23)
CO2: 22 mmol/L (ref 22–32)
Calcium: 9.2 mg/dL (ref 8.9–10.3)
Chloride: 101 mmol/L (ref 98–111)
Creatinine, Ser: 0.83 mg/dL (ref 0.44–1.00)
GFR, Estimated: >60 mL/min (ref >60)
Glucose, Bld: 160 mg/dL — ABNORMAL HIGH (ref 70–99)
Potassium: 3.6 mmol/L (ref 3.5–5.1)
Sodium: 133 mmol/L — ABNORMAL LOW (ref 135–145)
Total Bilirubin: 1 mg/dL (ref 0.0–1.2)
Total Protein: 8 g/dL (ref 6.5–8.1)

## 2023-07-14 LAB — URINALYSIS, DIPSTICK ONLY
Bilirubin Urine: NEGATIVE
Glucose, UA: NEGATIVE mg/dL
Hgb urine dipstick: NEGATIVE
Ketones, ur: NEGATIVE mg/dL
Leukocytes,Ua: NEGATIVE
Nitrite: NEGATIVE
Protein, ur: 100 mg/dL — AB
Specific Gravity, Urine: 1.015 (ref 1.005–1.030)
pH: 5 (ref 5.0–8.0)

## 2023-07-14 LAB — MAGNESIUM: Magnesium: 2.2 mg/dL (ref 1.7–2.4)

## 2023-07-14 MED ORDER — FLUOROURACIL CHEMO INJECTION 2.5 GM/50ML
320.0000 mg/m2 | Freq: Once | INTRAVENOUS | Status: AC
  Administered 2023-07-14: 600 mg via INTRAVENOUS
  Filled 2023-07-14: qty 12

## 2023-07-14 MED ORDER — SODIUM CHLORIDE 0.9 % IV SOLN
5.0000 mg/kg | Freq: Once | INTRAVENOUS | Status: AC
  Administered 2023-07-14: 400 mg via INTRAVENOUS
  Filled 2023-07-14: qty 16

## 2023-07-14 MED ORDER — DEXAMETHASONE SODIUM PHOSPHATE 10 MG/ML IJ SOLN
10.0000 mg | Freq: Once | INTRAMUSCULAR | Status: AC
  Administered 2023-07-14: 10 mg via INTRAVENOUS
  Filled 2023-07-14: qty 1

## 2023-07-14 MED ORDER — SODIUM CHLORIDE 0.9 % IV SOLN
144.0000 mg/m2 | Freq: Once | INTRAVENOUS | Status: AC
  Administered 2023-07-14: 300 mg via INTRAVENOUS
  Filled 2023-07-14: qty 15

## 2023-07-14 MED ORDER — SODIUM CHLORIDE 0.9% FLUSH
10.0000 mL | INTRAVENOUS | Status: DC | PRN
  Administered 2023-07-14: 10 mL via INTRAVENOUS

## 2023-07-14 MED ORDER — LEUCOVORIN CALCIUM INJECTION 350 MG
320.0000 mg/m2 | Freq: Once | INTRAMUSCULAR | Status: AC
  Administered 2023-07-14: 608 mg via INTRAVENOUS
  Filled 2023-07-14: qty 30.4

## 2023-07-14 MED ORDER — SODIUM CHLORIDE 0.9 % IV SOLN: INTRAVENOUS | Status: DC

## 2023-07-14 MED ORDER — ATROPINE SULFATE 1 MG/ML IV SOLN
0.5000 mg | Freq: Once | INTRAVENOUS | Status: AC
  Administered 2023-07-14: 0.5 mg via INTRAVENOUS
  Filled 2023-07-14: qty 1

## 2023-07-14 MED ORDER — PALONOSETRON HCL INJECTION 0.25 MG/5ML
0.2500 mg | Freq: Once | INTRAVENOUS | Status: AC
  Administered 2023-07-14: 0.25 mg via INTRAVENOUS
  Filled 2023-07-14: qty 5

## 2023-07-14 MED ORDER — FLUOROURACIL CHEMO INJECTION 5 GM/100ML
1920.0000 mg/m2 | INTRAVENOUS | Status: DC
  Administered 2023-07-14: 3500 mg via INTRAVENOUS
  Filled 2023-07-14: qty 70

## 2023-07-14 NOTE — Patient Instructions (Signed)
 [facility] - A DEPT OF [facility]  Discharge Instructions: Thank you for choosing [facility] to provide your oncology and hematology care.  If you have a lab appointment with the Cancer Center - please note that after April 8th, 2024, all labs will be drawn in the cancer center.  You do not have to check in or register with the main entrance as you have in the past but will complete your check-in in the cancer center.  Wear comfortable clothing and clothing appropriate for easy access to any Portacath or PICC line.   We strive to give you quality time with your provider. You may need to reschedule your appointment if you arrive late (15 or more minutes).  Arriving late affects you and other patients whose appointments are after yours.  Also, if you miss three or more appointments without notifying the office, you may be dismissed from the clinic at the provider's discretion.      For prescription refill requests, have your pharmacy contact our office and allow 72 hours for refills to be completed.    Today you received the following chemotherapy and/or immunotherapy agents Folfiri MVASI  5FU pump start      To help prevent nausea and vomiting after your treatment, we encourage you to take your nausea medication as directed.  BELOW ARE SYMPTOMS THAT SHOULD BE REPORTED IMMEDIATELY: *FEVER GREATER THAN 100.4 F (38 C) OR HIGHER *CHILLS OR SWEATING *NAUSEA AND VOMITING THAT IS NOT CONTROLLED WITH YOUR NAUSEA MEDICATION *UNUSUAL SHORTNESS OF BREATH *UNUSUAL BRUISING OR BLEEDING *URINARY PROBLEMS (pain or burning when urinating, or frequent urination) *BOWEL PROBLEMS (unusual diarrhea, constipation, pain near the anus) TENDERNESS IN MOUTH AND THROAT WITH OR WITHOUT PRESENCE OF ULCERS (sore throat, sores in mouth, or a toothache) UNUSUAL RASH, SWELLING OR PAIN  UNUSUAL VAGINAL DISCHARGE OR ITCHING   Items with * indicate a potential emergency and  should be followed up as soon as possible or go to the Emergency Department if any problems should occur.  Please show the CHEMOTHERAPY ALERT CARD or IMMUNOTHERAPY ALERT CARD at check-in to the Emergency Department and triage nurse.  Should you have questions after your visit or need to cancel or reschedule your appointment, please contact [facility] [place] - A DEPT OF [facility] [phone]  and follow the prompts.  Office hours are 8:00 a.m. to 4:30 p.m. Monday - Friday. Please note that voicemails left after 4:00 p.m. may not be returned until the following business day.  We are closed weekends and major holidays. You have access to a nurse at all times for urgent questions. Please call the main number to the clinic [phone] and follow the prompts.  For any non-urgent questions, you may also contact your provider using MyChart. We now offer e-Visits for anyone 32 and older to request care online for non-urgent symptoms. For details visit [email or web address].   Also download the MyChart app! Go to the app store, search "MyChart", open the app, select [facility], and log in with your MyChart username and password.

## 2023-07-14 NOTE — Progress Notes (Signed)
 Patient presents today for Fofiri infusion with 5FU pump start per providers order.  Vital signs and labs reviewed by MD. Message received from Donzell Gallery RN/Dr.Katrgadda patient okay for treatment.  Patient will receive MVASI  today.  Treatment given today per MD orders.  Tolerated infusion without adverse affects.  Vital signs stable.  5FU pump connected and verified RUN on the screen with the patient.  No complaints at this time.  Discharge from clinic ambulatory in stable condition.  Alert and oriented X 3.  Follow up with [facility] as scheduled.

## 2023-07-14 NOTE — Patient Instructions (Signed)

## 2023-07-14 NOTE — Progress Notes (Signed)
 Patient has been examined by Dr. Cheree Cords. Vital signs and labs have been reviewed by MD - ANC, Creatinine, LFTs, hemoglobin, urine protein (100) and platelets are within treatment parameters per M.D. - pt may proceed with treatment.  Primary RN and pharmacy notified.

## 2023-07-14 NOTE — Progress Notes (Signed)
 [facility] [address], [place] [address]    Clinic Day:  07/14/2023  Referring physician: Paulett Boros, MD  Patient Care Team: Paulett Boros, MD as PCP - General (Hematology) Gerhard Knuckles, RN as Oncology Nurse Navigator (Oncology)   ASSESSMENT & PLAN:   Assessment: 1.  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 -CT chest on 05/02/2020 with no evidence of pulmonary metastatic disease. -CTAP on 05/07/2020 with multiple lesions throughout the liver, largest in the right lobe measuring 3.9 cm, lateral segment left lobe measuring 2.6 cm and inferior right lobe confluent lesion 4.2 x 3.7 cm.  No lymphadenopathy. -CEA on 05/02/2020-60.2. -FOLFOX and bevacizumab  started on 06/04/2020. 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 - FOLFOX with bevacizumab  from 03/10/2023 through 05/24/2023 with progression. - FOLFIRI and bevacizumab  started on 06/09/2023   2.  Iron deficiency anemia: -Feraheme  on 04/30/2020 and 05/06/2020.   3.  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 -Smoked for 3 to 4 years and quit. -Mother had cancer, type unknown.  Maternal uncle had lung cancer.  Maternal aunt had lung cancer.    Plan: 1.  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 - She has tolerated cycle 2 of FOLFIRI with bevacizumab  on 06/29/2023 reasonably well. - She had her top teeth pulled 2 weeks ago and is currently in pain which is improving. - Reviewed labs today: AST elevated at 47.  Rest of LFTs are normal.  CBC grossly normal.  UA protein shows  is 100. - Would continue with cycle 3 FOLFIRI with 20% dose reduction along with bevacizumab .  RTC 4 weeks for follow-up.   2.  Hypertension: - Continue Norvasc  10 mg daily and triamterene /HCTZ.  Blood pressure is 135/75.   3.  Severe hypokalemia: - Continue potassium 40 mill equivalents daily.  Potassium is 3.6.   4.  Peripheral neuropathy: - Continue gabapentin  100 mg 3 times daily.  Tingling in the fingertips is stable.   5.  Anxiety: - Continue Klonopin  1 mg 3 times daily.  Continue Paxil  40 mg daily.  6.  Newly diagnosed diabetes: - We started her on glimepiride  1 mg tablet with breakfast.  Blood sugar has improved to 160 today.  She talked to our dietitian regarding diet modification.  She also has a glucometer to check at home.     No orders of the defined types were placed in this encounter.      Paulett Boros, MD   1/15/202510:44 AM  CHIEF COMPLAINT:   Diagnosis: metastatic colon cancer to liver    Cancer Staging  No matching staging information was found for the patient.    Prior Therapy: 1. Sigmoid colectomy and end colostomy on 04/29/2020  2. FOLFOX with bevacizumab , 06/04/20 - 11/06/20 3. Maintenance 5-FU and bevacizumab , 11/20/20 - 02/17/23  Current Therapy:  FOLFOX with bevacizumab    HISTORY OF PRESENT ILLNESS:   Oncology History  Metastatic colon cancer to liver (HCC)  05/16/2020 Initial Diagnosis   Metastatic colon cancer to liver (HCC)   06/03/2020 Genetic Testing   Foundation One:     06/04/2020 - 02/25/2022 Chemotherapy   Patient is on Treatment Plan : COLORECTAL FOLFOX +  Bevacizumab  q14d     06/04/2020 - 05/26/2023 Chemotherapy   Patient is on Treatment Plan : COLORECTAL FOLFOX + Bevacizumab  q14d     06/09/2023 -  Chemotherapy   Patient is on Treatment Plan : COLORECTAL FOLFIRI + Bevacizumab  q14d        INTERVAL HISTORY:   Susan Martin is a 63 y.o. female seen for follow-up of metastatic colorectal cancer.  Appetite is reported a 75%.   Energy levels are 75%.  She had dental extraction of the upper jaw 2 weeks ago.  She has some pain from it.  PAST MEDICAL HISTORY:   Past Medical History: Past Medical History:  Diagnosis Date   Adenocarcinoma of colon metastatic to liver [facility]) onocology--- dr s. Cheree Cords   04-29-2020 emergerency surgery for perforated colon s/p sigmoid colectomy w/ colostomy; dx  Stage IV colon cancer mets to liver   Anxiety    Depression    Hypertension    followed by dr Monty App and oncology  (05-27-2020 per pt does not have pcp yet)   IDA (iron deficiency anemia)    Pulmonary nodule, right     Surgical History: Past Surgical History:  Procedure Laterality Date   ABDOMINAL HYSTERECTOMY  03-31-2016   @[facility]    W/  BILATERAL SALPINOOPHORECTOMY    APPLICATION OF WOUND VAC N/A 04/29/2020   Procedure: APPLICATION OF WOUND VAC;  Surgeon: Derral Flick, MD;  Location: [facility] OR;  Service: General;  Laterality: N/A;   ENDOMETRIAL ABLATION     LAPAROTOMY N/A 04/29/2020   Procedure: EXPLORATORY LAPAROTOMY;  Surgeon: Derral Flick, MD;  Location: [facility] OR;  Service: General;  Laterality: N/A;   PARTIAL COLECTOMY N/A 04/29/2020   Procedure: PARTIAL COLECTOMY WITH END COLOSTOMY;  Surgeon: Dorrie Gaudier Alphonso Aschoff, MD;  Location: [facility] OR;  Service: General;  Laterality: N/A;   PORTACATH PLACEMENT Right 05/28/2020   Procedure: INSERTION PORT-A-CATH;  Surgeon: Kinsinger, Alphonso Aschoff, MD;  [facility];  Service: General;  Laterality: Right;   SALPINGOOPHORECTOMY Left 03/31/2016   Procedure: LEFT SALPINGO OOPHORECTOMY WITH FROZEN SECTION,  ABDOMINAL HYSTERECTOMY WITH BILATERAL SALPINGO-OOPHORECTOMY;  Surgeon: Albino Hum, MD;  Location: [facility] ORS;  Service: Gynecology;  Laterality: Left;    Social History: Social History   Socioeconomic History   Marital status: Divorced    Spouse name: Not on file   Number of children: Not on file   Years of education: Not on file   Highest  education level: Not on file  Occupational History   Not on file  Tobacco Use   Smoking status: Former    Current packs/day: 0.00    Average packs/day: 0.5 packs/day for 5.0 years (2.5 ttl pk-yrs)    Types: Cigarettes    Start date: 03/27/2010    Quit date: 03/28/2015    Years since quitting: 8.3   Smokeless tobacco: Never  Vaping Use   Vaping status: Never Used  Substance and Sexual Activity   Alcohol use: No   Drug use: Never   Sexual activity: Yes    Partners: Male    Birth control/protection: Surgical  Other Topics Concern   Not on file  Social History Narrative   Not on file   Social Drivers of Health   Financial Resource Strain: Low Risk  (11/01/2019)   Overall Financial Resource Strain (CARDIA)    Difficulty of Paying Living Expenses: Not very hard  Food Insecurity: No Food Insecurity (11/01/2019)   Hunger Vital Sign    Worried About Running Out  of Food in the Last Year: Never true    Ran Out of Food in the Last Year: Never true  Transportation Needs: No Transportation Needs (11/01/2019)   PRAPARE - Administrator, Civil Service (Medical): No    Lack of Transportation (Non-Medical): No  Physical Activity: Insufficiently Active (11/01/2019)   Exercise Vital Sign    Days of Exercise per Week: 2 days    Minutes of Exercise per Session: 30 min  Stress: Stress Concern Present (11/01/2019)   [employer] of Occupational Health - Occupational Stress Questionnaire    Feeling of Stress : To some extent  Social Connections: Moderately Integrated (11/01/2019)   Social Connection and Isolation Panel [NHANES]    Frequency of Communication with Friends and Family: More than three times a week    Frequency of Social Gatherings with Friends and Family: Once a week    Attends Religious Services: More than 4 times per year    Active Member of [employer] or Organizations: No    Attends Banker Meetings: Never    Marital Status: Living with partner  Intimate Partner  Violence: Not At Risk (11/01/2019)   Humiliation, Afraid, Rape, and Kick questionnaire    Fear of Current or Ex-Partner: No    Emotionally Abused: No    Physically Abused: No    Sexually Abused: No    Family History: Family History  Problem Relation Age of Onset   Hypertension Mother    Diabetes Mother    Cirrhosis Father     Current Medications:  Current Outpatient Medications:    Lancets (ONETOUCH DELICA PLUS LANCET33G) MISC, Apply topically., Disp: , Rfl:    ONETOUCH ULTRA test strip, USE AS DIRECTED UP TO 4 TIMES DAILY AS DIRECTED, Disp: , Rfl:    amLODipine  (NORVASC ) 10 MG tablet, TAKE 1 TABLET BY MOUTH EVERY DAY, Disp: 90 tablet, Rfl: 2   amLODipine  (NORVASC ) 5 MG tablet, TAKE 1 TABLET (5 MG TOTAL) BY MOUTH DAILY., Disp: 90 tablet, Rfl: 1   BEVACIZUMAB  IV, Inject 5 mg/kg into the vein every 14 (fourteen) days., Disp: , Rfl:    blood glucose meter kit and supplies KIT, Dispense based on patient and insurance preference. Use up to four times daily as directed., Disp: 1 each, Rfl: 6   clonazePAM  (KLONOPIN ) 1 MG tablet, Take 1 tablet (1 mg total) by mouth in the morning, at noon, and at bedtime., Disp: 90 tablet, Rfl: 3   fluorouracil  CALGB 57846 in sodium chloride  0.9 % 150 mL, Inject 2,400 mg/m2 into the vein over 48 hr., Disp: , Rfl:    gabapentin  (NEURONTIN ) 100 MG capsule, TAKE 1 CAPSULE (100 MG TOTAL) BY MOUTH 3 TIMES A DAY, Disp: 90 capsule, Rfl: 3   glimepiride  (AMARYL ) 1 MG tablet, TAKE 1 TABLET BY MOUTH DAILY WITH BREAKFAST., Disp: 90 tablet, Rfl: 1   LEUCOVORIN  CALCIUM  IV, Inject 400 mg/m2 into the vein every 21 ( twenty-one) days., Disp: , Rfl:    lidocaine  (XYLOCAINE ) 2 % solution, Use as directed 15 mLs in the mouth or throat every 6 (six) hours as needed for mouth pain. Swish and spit/swallow every six hours as needed for mouth pain, Disp: 480 mL, Rfl: 3   lidocaine -prilocaine  (EMLA ) cream, Apply 1 Application topically as needed., Disp: 30 g, Rfl: 0   nystatin  (MYCOSTATIN) 100000 UNIT/ML suspension, Take by mouth., Disp: , Rfl:    OXALIPLATIN  IV, Inject into the vein every 14 (fourteen) days., Disp: , Rfl:  oxyCODONE  (OXY IR/ROXICODONE ) 5 MG immediate release tablet, Take 5 mg by mouth every 6 (six) hours as needed. for pain, Disp: , Rfl:    PARoxetine  (PAXIL ) 40 MG tablet, Take 1 tablet (40 mg total) by mouth every morning., Disp: 90 tablet, Rfl: 3   polyethylene glycol (MIRALAX  / GLYCOLAX ) packet, Take 17 g by mouth every other day., Disp: , Rfl:    potassium chloride  (KLOR-CON  M10) 10 MEQ tablet, Take 2 tablets (20 mEq total) by mouth daily., Disp: 180 tablet, Rfl: 3   triamterene -hydrochlorothiazide  (MAXZIDE -25) 37.5-25 MG tablet, TAKE 1 TABLET BY MOUTH EVERY DAY, Disp: 90 tablet, Rfl: 1   Allergies: No Known Allergies  REVIEW OF SYSTEMS:   Review of Systems  Constitutional:  Negative for chills, fatigue and fever.  HENT:   Negative for lump/mass, mouth sores, nosebleeds, sore throat and trouble swallowing.   Eyes:  Negative for eye problems.  Respiratory:  Positive for shortness of breath (with exertion). Negative for cough.   Cardiovascular:  Negative for chest pain, leg swelling and palpitations.  Gastrointestinal:  Negative for abdominal pain, constipation, diarrhea, nausea and vomiting.  Genitourinary:  Negative for bladder incontinence, difficulty urinating, dysuria, frequency, hematuria and nocturia.   Musculoskeletal:  Negative for arthralgias, back pain, flank pain, myalgias and neck pain.  Skin:  Negative for itching and rash.  Neurological:  Positive for numbness (in hands). Negative for dizziness and headaches.  Hematological:  Does not bruise/bleed easily.  Psychiatric/Behavioral:  Positive for depression. Negative for suicidal ideas. The patient is nervous/anxious.   All other systems reviewed and are negative.    VITALS:   Weight 177 lb 0.5 oz (80.3 kg).  Wt Readings from Last 3 Encounters:  07/14/23 177 lb 0.5 oz  (80.3 kg)  06/29/23 180 lb 3.2 oz (81.7 kg)  06/09/23 181 lb 6.4 oz (82.3 kg)    Body mass index is 31.36 kg/m.  Performance status (ECOG): 1 - Symptomatic but completely ambulatory  PHYSICAL EXAM:   Physical Exam Vitals and nursing note reviewed. Exam conducted with a chaperone present.  Constitutional:      Appearance: Normal appearance.  Cardiovascular:     Rate and Rhythm: Normal rate and regular rhythm.     Pulses: Normal pulses.     Heart sounds: Normal heart sounds.  Pulmonary:     Effort: Pulmonary effort is normal.     Breath sounds: Normal breath sounds.  Abdominal:     Palpations: Abdomen is soft. There is no hepatomegaly, splenomegaly or mass.     Tenderness: There is no abdominal tenderness.  Musculoskeletal:     Right lower leg: No edema.     Left lower leg: No edema.  Lymphadenopathy:     Cervical: No cervical adenopathy.     Right cervical: No superficial, deep or posterior cervical adenopathy.    Left cervical: No superficial, deep or posterior cervical adenopathy.     Upper Body:     Right upper body: No supraclavicular or axillary adenopathy.     Left upper body: No supraclavicular or axillary adenopathy.  Neurological:     General: No focal deficit present.     Mental Status: She is alert and oriented to person, place, and time.  Psychiatric:        Mood and Affect: Mood normal.        Behavior: Behavior normal.     LABS:      Latest Ref Rng & Units 07/14/2023    9:46 AM 06/29/2023  7:51 AM 06/09/2023    8:40 AM  CBC  WBC 4.0 - 10.5 K/uL 4.0  4.2  4.5   Hemoglobin 12.0 - 15.0 g/dL 82.9  56.2  13.0   Hematocrit 36.0 - 46.0 % 38.8  38.6  39.7   Platelets 150 - 400 K/uL 149  166  129       Latest Ref Rng & Units 07/14/2023    9:46 AM 06/29/2023    7:51 AM 06/09/2023    8:40 AM  CMP  Glucose 70 - 99 mg/dL 865  784  696   BUN 8 - 23 mg/dL 15  13  13    Creatinine 0.44 - 1.00 mg/dL 2.95  2.84  1.32   Sodium 135 - 145 mmol/L 133  134  130    Potassium 3.5 - 5.1 mmol/L 3.6  3.3  3.4   Chloride 98 - 111 mmol/L 101  103  97   CO2 22 - 32 mmol/L 22  20  22    Calcium  8.9 - 10.3 mg/dL 9.2  9.0  8.9   Total Protein 6.5 - 8.1 g/dL 8.0  7.6  7.6   Total Bilirubin 0.0 - 1.2 mg/dL 1.0  1.0  1.0   Alkaline Phos 38 - 126 U/L 172  166  155   AST 15 - 41 U/L 47  42  47   ALT 0 - 44 U/L 28  26  27       Lab Results  Component Value Date   CEA1 27.5 (H) 05/24/2023   /  CEA  Date Value Ref Range Status  05/24/2023 27.5 (H) 0.0 - 4.7 ng/mL Final    Comment:    (NOTE)                             Nonsmokers          <3.9                             Smokers             <5.6 Roche Diagnostics Electrochemiluminescence Immunoassay (ECLIA) Values obtained with different assay methods or kits cannot be used interchangeably.  Results cannot be interpreted as absolute evidence of the presence or absence of malignant disease. Performed At: [facility] [address] [place], [place] [id number] Pearlean Botts MD [id number]    No results found for: "PSA1" No results found for: "CAN199" No results found for: "CAN125"  No results found for: "TOTALPROTELP", "ALBUMINELP", "A1GS", "A2GS", "BETS", "BETA2SER", "GAMS", "MSPIKE", "SPEI" Lab Results  Component Value Date   TIBC 248 (L) 04/29/2020   FERRITIN 57 04/29/2020   IRONPCTSAT 3 (L) 04/29/2020   No results found for: "LDH"   STUDIES:   No results found.

## 2023-07-16 ENCOUNTER — Inpatient Hospital Stay: Payer: No Typology Code available for payment source

## 2023-07-16 VITALS — BP 150/83 | HR 74 | Temp 97.6°F | Resp 18

## 2023-07-16 DIAGNOSIS — C189 Malignant neoplasm of colon, unspecified: Secondary | ICD-10-CM

## 2023-07-16 DIAGNOSIS — Z5111 Encounter for antineoplastic chemotherapy: Secondary | ICD-10-CM | POA: Diagnosis not present

## 2023-07-16 MED ORDER — HEPARIN SOD (PORK) LOCK FLUSH 100 UNIT/ML IV SOLN
500.0000 [IU] | Freq: Once | INTRAVENOUS | Status: AC | PRN
  Administered 2023-07-16: 500 [IU]

## 2023-07-16 MED ORDER — SODIUM CHLORIDE 0.9% FLUSH
10.0000 mL | INTRAVENOUS | Status: DC | PRN
  Administered 2023-07-16: 10 mL

## 2023-07-16 NOTE — Patient Instructions (Signed)

## 2023-07-16 NOTE — Progress Notes (Signed)
Patients port flushed without difficulty.  Good blood return noted with no bruising or swelling noted at site.  Home infusion 5FU pump disconnected.  Band aid applied.  VSS with discharge and left in satisfactory condition with no s/s of distress noted.   °

## 2023-07-28 ENCOUNTER — Inpatient Hospital Stay: Payer: No Typology Code available for payment source

## 2023-07-28 ENCOUNTER — Inpatient Hospital Stay: Payer: No Typology Code available for payment source | Admitting: Hematology

## 2023-07-28 VITALS — BP 153/76 | HR 79 | Temp 97.6°F | Resp 18 | Ht 63.0 in | Wt 174.8 lb

## 2023-07-28 DIAGNOSIS — C189 Malignant neoplasm of colon, unspecified: Secondary | ICD-10-CM

## 2023-07-28 DIAGNOSIS — Z5111 Encounter for antineoplastic chemotherapy: Secondary | ICD-10-CM | POA: Diagnosis not present

## 2023-07-28 DIAGNOSIS — C787 Secondary malignant neoplasm of liver and intrahepatic bile duct: Secondary | ICD-10-CM

## 2023-07-28 LAB — CBC WITH DIFFERENTIAL/PLATELET
Abs Immature Granulocytes: 0.01 10*3/uL (ref 0.00–0.07)
Basophils Absolute: 0 10*3/uL (ref 0.0–0.1)
Basophils Relative: 1 %
Eosinophils Absolute: 0.1 10*3/uL (ref 0.0–0.5)
Eosinophils Relative: 2 %
HCT: 37.2 % (ref 36.0–46.0)
Hemoglobin: 12.4 g/dL (ref 12.0–15.0)
Immature Granulocytes: 0 %
Lymphocytes Relative: 30 %
Lymphs Abs: 1.5 10*3/uL (ref 0.7–4.0)
MCH: 31.3 pg (ref 26.0–34.0)
MCHC: 33.3 g/dL (ref 30.0–36.0)
MCV: 93.9 fL (ref 80.0–100.0)
Monocytes Absolute: 0.7 10*3/uL (ref 0.1–1.0)
Monocytes Relative: 15 %
Neutro Abs: 2.5 10*3/uL (ref 1.7–7.7)
Neutrophils Relative %: 52 %
Platelets: 146 10*3/uL — ABNORMAL LOW (ref 150–400)
RBC: 3.96 MIL/uL (ref 3.87–5.11)
RDW: 17.3 % — ABNORMAL HIGH (ref 11.5–15.5)
WBC: 4.9 10*3/uL (ref 4.0–10.5)
nRBC: 0 % (ref 0.0–0.2)

## 2023-07-28 LAB — COMPREHENSIVE METABOLIC PANEL
ALT: 28 U/L (ref 0–44)
AST: 40 U/L (ref 15–41)
Albumin: 3.6 g/dL (ref 3.5–5.0)
Alkaline Phosphatase: 146 U/L — ABNORMAL HIGH (ref 38–126)
Anion gap: 12 (ref 5–15)
BUN: 14 mg/dL (ref 8–23)
CO2: 22 mmol/L (ref 22–32)
Calcium: 9.2 mg/dL (ref 8.9–10.3)
Chloride: 99 mmol/L (ref 98–111)
Creatinine, Ser: 1.07 mg/dL — ABNORMAL HIGH (ref 0.44–1.00)
GFR, Estimated: 58 mL/min — ABNORMAL LOW (ref >60)
Glucose, Bld: 150 mg/dL — ABNORMAL HIGH (ref 70–99)
Potassium: 3.7 mmol/L (ref 3.5–5.1)
Sodium: 133 mmol/L — ABNORMAL LOW (ref 135–145)
Total Bilirubin: 1.1 mg/dL (ref 0.0–1.2)
Total Protein: 8.1 g/dL (ref 6.5–8.1)

## 2023-07-28 LAB — URINALYSIS, DIPSTICK ONLY
Bilirubin Urine: NEGATIVE
Glucose, UA: NEGATIVE mg/dL
Hgb urine dipstick: NEGATIVE
Ketones, ur: NEGATIVE mg/dL
Nitrite: NEGATIVE
Protein, ur: 100 mg/dL — AB
Specific Gravity, Urine: 1.015 (ref 1.005–1.030)
pH: 5 (ref 5.0–8.0)

## 2023-07-28 LAB — MAGNESIUM: Magnesium: 2.2 mg/dL (ref 1.7–2.4)

## 2023-07-28 MED ORDER — DEXAMETHASONE SODIUM PHOSPHATE 10 MG/ML IJ SOLN
10.0000 mg | Freq: Once | INTRAMUSCULAR | Status: AC
  Administered 2023-07-28: 10 mg via INTRAVENOUS
  Filled 2023-07-28: qty 1

## 2023-07-28 MED ORDER — BEVACIZUMAB-AWWB CHEMO INJECTION 400 MG/16ML
5.0000 mg/kg | Freq: Once | INTRAVENOUS | Status: AC
  Administered 2023-07-28: 400 mg via INTRAVENOUS
  Filled 2023-07-28: qty 16

## 2023-07-28 MED ORDER — SODIUM CHLORIDE 0.9 % IV SOLN
144.0000 mg/m2 | Freq: Once | INTRAVENOUS | Status: AC
  Administered 2023-07-28: 300 mg via INTRAVENOUS
  Filled 2023-07-28: qty 15

## 2023-07-28 MED ORDER — SODIUM CHLORIDE 0.9 % IV SOLN: INTRAVENOUS | Status: DC

## 2023-07-28 MED ORDER — FLUOROURACIL CHEMO INJECTION 2.5 GM/50ML
320.0000 mg/m2 | Freq: Once | INTRAVENOUS | Status: AC
  Administered 2023-07-28: 600 mg via INTRAVENOUS
  Filled 2023-07-28: qty 12

## 2023-07-28 MED ORDER — ATROPINE SULFATE 1 MG/ML IV SOLN
0.5000 mg | Freq: Once | INTRAVENOUS | Status: AC
  Administered 2023-07-28: 0.5 mg via INTRAVENOUS
  Filled 2023-07-28: qty 1

## 2023-07-28 MED ORDER — SODIUM CHLORIDE 0.9 % IV SOLN
320.0000 mg/m2 | Freq: Once | INTRAVENOUS | Status: AC
  Administered 2023-07-28: 608 mg via INTRAVENOUS
  Filled 2023-07-28: qty 30.4

## 2023-07-28 MED ORDER — PALONOSETRON HCL INJECTION 0.25 MG/5ML
0.2500 mg | Freq: Once | INTRAVENOUS | Status: AC
  Administered 2023-07-28: 0.25 mg via INTRAVENOUS
  Filled 2023-07-28: qty 5

## 2023-07-28 MED ORDER — FLUOROURACIL CHEMO INJECTION 5 GM/100ML
1920.0000 mg/m2 | INTRAVENOUS | Status: DC
  Administered 2023-07-28: 3500 mg via INTRAVENOUS
  Filled 2023-07-28: qty 70

## 2023-07-28 NOTE — Progress Notes (Signed)
Ok to proceed with Urine protein 100 today.  Pryor Ochoa, PharmD

## 2023-07-28 NOTE — Progress Notes (Signed)
Patient tolerated chemotherapy with no complaints voiced.  Side effects with management reviewed with understanding verbalized.  Port site clean and dry with no bruising or swelling noted at site.  Good blood return noted before and after administration of chemotherapy.  5FU pump started for home use, pt due to return to clinic 07/30/23 for pump stop.  Patient left in satisfactory condition with VSS and no s/s of distress noted. All follow ups as scheduled.   Zariya Minner [employer]

## 2023-07-28 NOTE — Patient Instructions (Signed)
[facility] [place] - [facility]  Discharge Instructions: Thank you for choosing [facility] to provide your oncology and hematology care.  If you have a lab appointment with the Cancer Center - please note that after April 8th, 2024, all labs will be drawn in the cancer center.  You do not have to check in or register with the main entrance as you have in the past but will complete your check-in in the cancer center.  Wear comfortable clothing and clothing appropriate for easy access to any Portacath or PICC line.   We strive to give you quality time with your provider. You may need to reschedule your appointment if you arrive late (15 or more minutes).  Arriving late affects you and other patients whose appointments are after yours.  Also, if you miss three or more appointments without notifying the office, you may be dismissed from the clinic at the provider's discretion.      For prescription refill requests, have your pharmacy contact our office and allow 72 hours for refills to be completed.    Today you received the following chemotherapy and/or immunotherapy agents avastin, irinotecan, leucovorin, adrucil   To help prevent nausea and vomiting after your treatment, we encourage you to take your nausea medication as directed.  BELOW ARE SYMPTOMS THAT SHOULD BE REPORTED IMMEDIATELY: *FEVER GREATER THAN 100.4 F (38 C) OR HIGHER *CHILLS OR SWEATING *NAUSEA AND VOMITING THAT IS NOT CONTROLLED WITH YOUR NAUSEA MEDICATION *UNUSUAL SHORTNESS OF BREATH *UNUSUAL BRUISING OR BLEEDING *URINARY PROBLEMS (pain or burning when urinating, or frequent urination) *BOWEL PROBLEMS (unusual diarrhea, constipation, pain near the anus) TENDERNESS IN MOUTH AND THROAT WITH OR WITHOUT PRESENCE OF ULCERS (sore throat, sores in mouth, or a toothache) UNUSUAL RASH, SWELLING OR PAIN  UNUSUAL VAGINAL DISCHARGE OR ITCHING   Items with * indicate a potential  emergency and should be followed up as soon as possible or go to the Emergency Department if any problems should occur.  Please show the CHEMOTHERAPY ALERT CARD or IMMUNOTHERAPY ALERT CARD at check-in to the Emergency Department and triage nurse.  Should you have questions after your visit or need to cancel or reschedule your appointment, please contact [facility] [place] - A DEPT OF [facility] [phone]  and follow the prompts.  Office hours are 8:00 a.m. to 4:30 p.m. Monday - Friday. Please note that voicemails left after 4:00 p.m. may not be returned until the following business day.  We are closed weekends and major holidays. You have access to a nurse at all times for urgent questions. Please call the main number to the clinic [phone] and follow the prompts.  For any non-urgent questions, you may also contact your provider using MyChart. We now offer e-Visits for anyone 53 and older to request care online for non-urgent symptoms. For details visit [email or web address].   Also download the MyChart app! Go to the app store, search "MyChart", open the app, select [place], and log in with your MyChart username and password.

## 2023-07-30 ENCOUNTER — Inpatient Hospital Stay: Payer: No Typology Code available for payment source

## 2023-07-30 VITALS — BP 156/79 | HR 76 | Temp 97.3°F | Resp 18

## 2023-07-30 DIAGNOSIS — C189 Malignant neoplasm of colon, unspecified: Secondary | ICD-10-CM

## 2023-07-30 DIAGNOSIS — Z5111 Encounter for antineoplastic chemotherapy: Secondary | ICD-10-CM | POA: Diagnosis not present

## 2023-07-30 MED ORDER — SODIUM CHLORIDE 0.9% FLUSH
10.0000 mL | INTRAVENOUS | Status: DC | PRN
  Administered 2023-07-30: 10 mL

## 2023-07-30 MED ORDER — HEPARIN SOD (PORK) LOCK FLUSH 100 UNIT/ML IV SOLN
500.0000 [IU] | Freq: Once | INTRAVENOUS | Status: AC | PRN
  Administered 2023-07-30: 500 [IU]

## 2023-07-30 NOTE — Progress Notes (Signed)
 Patient presents today for 5FU pump stop and disconnection after 46 hour continous infusion.   5FU pump deaccessed.  Patients port flushed without difficulty.  Good blood return noted with no bruising or swelling noted at site.  Needle removed intact.  Band aid applied.  VSS with discharge and left in satisfactory condition via wheelchair with no s/s of distress noted.

## 2023-07-30 NOTE — Patient Instructions (Signed)
 [facility] [place] [facility]  Discharge Instructions: Thank you for choosing [facility] to provide your oncology and hematology care.  If you have a lab appointment with the Cancer Center - please note that after April 8th, 2024, all labs will be drawn in the cancer center.  You do not have to check in or register with the main entrance as you have in the past but will complete your check-in in the cancer center.  Wear comfortable clothing and clothing appropriate for easy access to any Portacath or PICC line.   We strive to give you quality time with your provider. You may need to reschedule your appointment if you arrive late (15 or more minutes).  Arriving late affects you and other patients whose appointments are after yours.  Also, if you miss three or more appointments without notifying the office, you may be dismissed from the clinic at the provider's discretion.      For prescription refill requests, have your pharmacy contact our office and allow 72 hours for refills to be completed.    Today you received the following chemotherapy and/or immunotherapy agents 5FU pump stop      To help prevent nausea and vomiting after your treatment, we encourage you to take your nausea medication as directed.  BELOW ARE SYMPTOMS THAT SHOULD BE REPORTED IMMEDIATELY: *FEVER GREATER THAN 100.4 F (38 C) OR HIGHER *CHILLS OR SWEATING *NAUSEA AND VOMITING THAT IS NOT CONTROLLED WITH YOUR NAUSEA MEDICATION *UNUSUAL SHORTNESS OF BREATH *UNUSUAL BRUISING OR BLEEDING *URINARY PROBLEMS (pain or burning when urinating, or frequent urination) *BOWEL PROBLEMS (unusual diarrhea, constipation, pain near the anus) TENDERNESS IN MOUTH AND THROAT WITH OR WITHOUT PRESENCE OF ULCERS (sore throat, sores in mouth, or a toothache) UNUSUAL RASH, SWELLING OR PAIN  UNUSUAL VAGINAL DISCHARGE OR ITCHING   Items with * indicate a potential emergency and should be  followed up as soon as possible or go to the Emergency Department if any problems should occur.  Please show the CHEMOTHERAPY ALERT CARD or IMMUNOTHERAPY ALERT CARD at check-in to the Emergency Department and triage nurse.  Should you have questions after your visit or need to cancel or reschedule your appointment, please contact [facility] [place] - A DEPT OF [facility] [phone]  and follow the prompts.  Office hours are 8:00 a.m. to 4:30 p.m. Monday - Friday. Please note that voicemails left after 4:00 p.m. may not be returned until the following business day.  We are closed weekends and major holidays. You have access to a nurse at all times for urgent questions. Please call the main number to the clinic [phone] and follow the prompts.  For any non-urgent questions, you may also contact your provider using MyChart. We now offer e-Visits for anyone 27 and older to request care online for non-urgent symptoms. For details visit [email or web address].   Also download the MyChart app! Go to the app store, search "MyChart", open the app, select [place], and log in with your MyChart username and password.

## 2023-08-01 ENCOUNTER — Other Ambulatory Visit: Payer: Self-pay | Admitting: Hematology

## 2023-08-02 ENCOUNTER — Other Ambulatory Visit: Payer: Self-pay | Admitting: *Deleted

## 2023-08-02 ENCOUNTER — Encounter: Payer: Self-pay | Admitting: Hematology

## 2023-08-02 MED ORDER — ONETOUCH ULTRA VI STRP: ORAL_STRIP | 6 refills | Status: AC

## 2023-08-04 ENCOUNTER — Inpatient Hospital Stay: Payer: No Typology Code available for payment source | Admitting: Hematology

## 2023-08-04 ENCOUNTER — Other Ambulatory Visit: Payer: No Typology Code available for payment source

## 2023-08-04 ENCOUNTER — Inpatient Hospital Stay: Payer: No Typology Code available for payment source

## 2023-08-06 ENCOUNTER — Inpatient Hospital Stay: Payer: No Typology Code available for payment source

## 2023-08-06 DIAGNOSIS — C20 Malignant neoplasm of rectum: Secondary | ICD-10-CM | POA: Diagnosis not present

## 2023-08-10 NOTE — Progress Notes (Signed)
[facility] [address] [place] [address]    Clinic Day:  08/11/2023  Referring physician: [name], MD  Patient Care Team: Susan Massed, MD as PCP - General (Hematology) Therese Sarah, RN as Oncology Nurse Navigator (Oncology)   ASSESSMENT & PLAN:   Assessment: 1.  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 -CT chest on 05/02/2020 with no evidence of pulmonary metastatic disease. -CTAP on 05/07/2020 with multiple lesions throughout the liver, largest in the right lobe measuring 3.9 cm, lateral segment left lobe measuring 2.6 cm and inferior right lobe confluent lesion 4.2 x 3.7 cm.  No lymphadenopathy. -CEA on 05/02/2020-60.2. -FOLFOX and bevacizumab started on 06/04/2020. 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 - FOLFOX with bevacizumab from 03/10/2023 through 05/24/2023 with progression. - FOLFIRI and bevacizumab started on 06/09/2023   2.  Iron deficiency anemia: -Feraheme on 04/30/2020 and 05/06/2020.   3.  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 -Smoked for 3 to 4 years and quit. -Mother had cancer, type unknown.  Maternal uncle had lung cancer.  Maternal aunt had lung cancer.    Plan: 1.  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 - She has tolerated last 2 cycles reasonably well.  No major GI side effects.  However she lost about 6 pounds in the last 6 weeks due to dental problems and difficulty chewing.  She is waiting for placement of dentures for the lower teeth. - Labs today: AST minimally elevated at 55, rest of the LFTs are normal.  CBC grossly normal  with mild leukopenia and ANC of 1.2.  Platelets are 114.  CEA is pending.  Last CEA was 27.5.  UA shows protein of 100. - Recommend adding pegylated G-CSF on day 3.  She may proceed with cycle 5 today with cycle 6 in 4 weeks for follow-up with repeat CT CAP with contrast and CEA level.   2.  Hypertension: - Continue Norvasc 10 mg daily and triamterene/HCTZ daily.  Blood pressure today 150/75.   3.  Severe hypokalemia: - Continue potassium 40 mEq daily.  Potassium today is 3.6   4.  Peripheral neuropathy: - Continue gabapentin 100 mg 3 times daily.  Tingling in the fingertips is stable.   5.  Anxiety: - Continue Klonopin 1 mg 3 times daily and Paxil 40 mg daily.  Symptoms well-controlled.  6.  Newly diagnosed diabetes: - Continue glimepiride 1 mg tablet with breakfast.  Monitor blood sugars at home.    Orders Placed This Encounter  Procedures   CT CHEST ABDOMEN PELVIS W CONTRAST    Standing Status:   Future    Expected Date:   09/08/2023    Expiration Date:   08/10/2024    If indicated for the ordered procedure, I authorize the administration of contrast media per Radiology protocol:   Yes    Does the patient have a contrast media/X-ray dye allergy?:   No    Preferred imaging location?:   [facility]    If indicated for the ordered procedure, I authorize the administration of oral contrast media per Radiology protocol:   Yes   CEA    Standing Status:   Future    Number of  Occurrences:   1    Expected Date:   08/11/2023    Expiration Date:   08/10/2024      I,Susan Martin,acting as a scribe for Susan Massed, MD.,have documented all relevant documentation on the behalf of Susan Massed, MD,as directed by  Susan Massed, MD while in the presence of Susan Massed, MD.   I, Susan Massed MD, have reviewed the above documentation for accuracy and completeness, and I agree with the above.   Susan Massed, MD   2/12/202511:03  AM  CHIEF COMPLAINT:   Diagnosis: metastatic colon cancer to liver    Cancer Staging  No matching staging information was found for the patient.    Prior Therapy: 1. Sigmoid colectomy and end colostomy on 04/29/2020  2. FOLFOX with bevacizumab, 06/04/20 - 11/06/20 3. Maintenance 5-FU and bevacizumab, 11/20/20 - 02/17/23  Current Therapy:  FOLFOX with bevacizumab    HISTORY OF PRESENT ILLNESS:   Oncology History  Metastatic colon cancer to liver (HCC)  05/16/2020 Initial Diagnosis   Metastatic colon cancer to liver (HCC)   06/03/2020 Genetic Testing   Foundation One:     06/04/2020 - 02/25/2022 Chemotherapy   Patient is on Treatment Plan : COLORECTAL FOLFOX + Bevacizumab q14d     06/04/2020 - 05/26/2023 Chemotherapy   Patient is on Treatment Plan : COLORECTAL FOLFOX + Bevacizumab q14d     06/09/2023 -  Chemotherapy   Patient is on Treatment Plan : COLORECTAL FOLFIRI + Bevacizumab q14d        INTERVAL HISTORY:   Susan Martin is a 63 y.o. female presenting to clinic today for follow up of metastatic colon cancer to liver. She was last seen by me on 07/14/23.  Today, she states that she is doing well overall. Her appetite level is at 75%. Her energy level is at 50%.  PAST MEDICAL HISTORY:   Past Medical History: Past Medical History:  Diagnosis Date   Adenocarcinoma of colon metastatic to liver [facility]) onocology--- dr s. Ellin Saba   04-29-2020 emerge[facility] surgery for perforated colon s/p sigmoid colectomy w/ colostomy; dx  Stage IV colon cancer mets to liver   Anxiety    Depression    Hypertension    followed by dr Emelda Fear and oncology  (05-27-2020 per pt does not have pcp yet)   IDA (iron deficiency anemia)    Pulmonary nodule, right     Surgical History: Past Surgical History:  Procedure Laterality Date   ABDOMINAL HYSTERECTOMY  03-31-2016   @[facility]    W/  BILATERAL SALPINOOPHORECTOMY    APPLICATION OF WOUND VAC N/A 04/29/2020   Procedure: APPLICATION OF WOUND VAC;   Surgeon: Rodman Pickle, MD;  Location: [facility] OR;  Service: General;  Laterality: N/A;   ENDOMETRIAL ABLATION     LAPAROTOMY N/A 04/29/2020   Procedure: EXPLORATORY LAPAROTOMY;  Surgeon: Rodman Pickle, MD;  Location: [facility] OR;  Service: General;  Laterality: N/A;   PARTIAL COLECTOMY N/A 04/29/2020   Procedure: PARTIAL COLECTOMY WITH END COLOSTOMY;  Surgeon: Sheliah Hatch De Blanch, MD;  Location: [facility] OR;  Service: General;  Laterality: N/A;   PORTACATH PLACEMENT Right 05/28/2020   Procedure: INSERTION PORT-A-CATH;  Surgeon: Kinsinger, De Blanch, MD;  Location: [facility];  Service: General;  Laterality: Right;   SALPINGOOPHORECTOMY Left 03/31/2016   Procedure: LEFT SALPINGO OOPHORECTOMY WITH FROZEN SECTION,  ABDOMINAL HYSTERECTOMY WITH BILATERAL SALPINGO-OOPHORECTOMY;  Surgeon: Tilda Burrow, MD;  Location: [facility];  Service: Gynecology;  Laterality: Left;    Social History: Social History  Socioeconomic History   Marital status: Divorced    Spouse name: Not on file   Number of children: Not on file   Years of education: Not on file   Highest education level: Not on file  Occupational History   Not on file  Tobacco Use   Smoking status: Former    Current packs/day: 0.00    Average packs/day: 0.5 packs/day for 5.0 years (2.5 ttl pk-yrs)    Types: Cigarettes    Start date: 03/27/2010    Quit date: 03/28/2015    Years since quitting: 8.3   Smokeless tobacco: Never  Vaping Use   Vaping status: Never Used  Substance and Sexual Activity   Alcohol use: No   Drug use: Never   Sexual activity: Yes    Partners: Male    Birth control/protection: Surgical  Other Topics Concern   Not on file  Social History Narrative   Not on file   Social Drivers of Health   Financial Resource Strain: Low Risk  (11/01/2019)   Overall Financial Resource Strain (CARDIA)    Difficulty of Paying Living Expenses: Not very hard  Food Insecurity: No Food Insecurity (11/01/2019)    Hunger Vital Sign    Worried About Running Out of Food in the Last Year: Never true    Ran Out of Food in the Last Year: Never true  Transportation Needs: No Transportation Needs (11/01/2019)   PRAPARE - Administrator, Civil Service (Medical): No    Lack of Transportation (Non-Medical): No  Physical Activity: Insufficiently Active (11/01/2019)   Exercise Vital Sign    Days of Exercise per Week: 2 days    Minutes of Exercise per Session: 30 min  Stress: Stress Concern Present (11/01/2019)   [employer] of Occupational Health - Occupational Stress Questionnaire    Feeling of Stress : To some extent  Social Connections: Moderately Integrated (11/01/2019)   Social Connection and Isolation Panel [NHANES]    Frequency of Communication with Friends and Family: More than three times a week    Frequency of Social Gatherings with Friends and Family: Once a week    Attends Religious Services: More than 4 times per year    Active Member of [employer] or Organizations: No    Attends Banker Meetings: Never    Marital Status: Living with partner  Intimate Partner Violence: Not At Risk (11/01/2019)   Humiliation, Afraid, Rape, and Kick questionnaire    Fear of Current or Ex-Partner: No    Emotionally Abused: No    Physically Abused: No    Sexually Abused: No    Family History: Family History  Problem Relation Age of Onset   Hypertension Mother    Diabetes Mother    Cirrhosis Father     Current Medications:  Current Outpatient Medications:    amLODipine (NORVASC) 10 MG tablet, TAKE 1 TABLET BY MOUTH EVERY DAY, Disp: 90 tablet, Rfl: 2   amLODipine (NORVASC) 5 MG tablet, TAKE 1 TABLET (5 MG TOTAL) BY MOUTH DAILY., Disp: 90 tablet, Rfl: 1   BEVACIZUMAB IV, Inject 5 mg/kg into the vein every 14 (fourteen) days., Disp: , Rfl:    blood glucose meter kit and supplies KIT, Dispense based on patient and insurance preference. Use up to four times daily as directed., Disp: 1 each,  Rfl: 6   clonazePAM (KLONOPIN) 1 MG tablet, Take 1 tablet (1 mg total) by mouth in the morning, at noon, and at bedtime., Disp: 90 tablet, Rfl:  3   fluorouracil CALGB 80702 in sodium chloride 0.9 % 150 mL, Inject 2,400 mg/m2 into the vein over 48 hr., Disp: , Rfl:    gabapentin (NEURONTIN) 100 MG capsule, TAKE 1 CAPSULE (100 MG TOTAL) BY MOUTH 3 TIMES A DAY, Disp: 90 capsule, Rfl: 3   glimepiride (AMARYL) 1 MG tablet, TAKE 1 TABLET BY MOUTH DAILY WITH BREAKFAST., Disp: 90 tablet, Rfl: 1   Lancets (ONETOUCH DELICA PLUS LANCET33G) MISC, Apply topically., Disp: , Rfl:    LEUCOVORIN CALCIUM IV, Inject 400 mg/m2 into the vein every 21 ( twenty-one) days., Disp: , Rfl:    lidocaine (XYLOCAINE) 2 % solution, Use as directed 15 mLs in the mouth or throat every 6 (six) hours as needed for mouth pain. Swish and spit/swallow every six hours as needed for mouth pain, Disp: 480 mL, Rfl: 3   lidocaine-prilocaine (EMLA) cream, Apply 1 Application topically as needed., Disp: 30 g, Rfl: 0   nystatin (MYCOSTATIN) 100000 UNIT/ML suspension, Take by mouth., Disp: , Rfl:    ONETOUCH ULTRA test strip, Use as instructed, Disp: 100 each, Rfl: 6   OXALIPLATIN IV, Inject into the vein every 14 (fourteen) days., Disp: , Rfl:    oxyCODONE (OXY IR/ROXICODONE) 5 MG immediate release tablet, Take 5 mg by mouth every 6 (six) hours as needed. for pain, Disp: , Rfl:    PARoxetine (PAXIL) 40 MG tablet, Take 1 tablet (40 mg total) by mouth every morning., Disp: 90 tablet, Rfl: 3   polyethylene glycol (MIRALAX / GLYCOLAX) packet, Take 17 g by mouth every other day., Disp: , Rfl:    potassium chloride (KLOR-CON M10) 10 MEQ tablet, Take 2 tablets (20 mEq total) by mouth daily., Disp: 180 tablet, Rfl: 3   triamterene-hydrochlorothiazide (MAXZIDE-25) 37.5-25 MG tablet, TAKE 1 TABLET BY MOUTH EVERY DAY, Disp: 90 tablet, Rfl: 1 No current facility-administered medications for this visit.  Facility-Administered Medications Ordered in Other  Visits:    0.9 %  sodium chloride infusion, , Intravenous, Continuous, Susan Massed, MD, Last Rate: 10 mL/hr at 08/11/23 1059, New Bag at 08/11/23 1059   atropine injection 0.5 mg, 0.5 mg, Intravenous, Once, Susan Massed, MD   bevacizumab-awwb (MVASI) 400 mg in sodium chloride 0.9 % 100 mL chemo infusion, 5 mg/kg (Order-Specific), Intravenous, Once, Susan Massed, MD   dexamethasone (DECADRON) injection 10 mg, 10 mg, Intravenous, Once, Susan Massed, MD, 10 mg at 08/11/23 1102   fluorouracil (ADRUCIL) 3,500 mg in sodium chloride 0.9 % 80 mL chemo infusion, 1,920 mg/m2 (Order-Specific), Intravenous, 1 day or 1 dose, Susan Massed, MD   fluorouracil (ADRUCIL) chemo injection 600 mg, 320 mg/m2 (Order-Specific), Intravenous, Once, Susan Massed, MD   irinotecan (CAMPTOSAR) 300 mg in sodium chloride 0.9 % 500 mL chemo infusion, 144 mg/m2 (Order-Specific), Intravenous, Once, Susan Massed, MD   leucovorin 608 mg in sodium chloride 0.9 % 250 mL infusion, 320 mg/m2 (Order-Specific), Intravenous, Once, Susan Massed, MD   Allergies: No Known Allergies  REVIEW OF SYSTEMS:   Review of Systems  Constitutional:  Negative for chills, fatigue and fever.  HENT:   Negative for lump/mass, mouth sores, nosebleeds, sore throat and trouble swallowing.   Eyes:  Negative for eye problems.  Respiratory:  Positive for shortness of breath. Negative for cough.   Cardiovascular:  Negative for chest pain, leg swelling and palpitations.  Gastrointestinal:  Negative for abdominal pain, constipation, diarrhea, nausea and vomiting.  Genitourinary:  Negative for bladder incontinence, difficulty urinating, dysuria, frequency, hematuria and nocturia.   Musculoskeletal:  Negative for arthralgias, back pain, flank pain, myalgias and neck pain.  Skin:  Negative for itching and rash.  Neurological:  Positive for numbness. Negative for dizziness and headaches.   Hematological:  Does not bruise/bleed easily.  Psychiatric/Behavioral:  Negative for depression, sleep disturbance and suicidal ideas. The patient is not nervous/anxious.   All other systems reviewed and are negative.    VITALS:   Blood pressure (!) 150/75, pulse 70, temperature 97.7 F (36.5 C), temperature source Tympanic, resp. rate 18, height 5\' 3"  (1.6 m), weight 174 lb (78.9 kg), SpO2 97%.  Wt Readings from Last 3 Encounters:  08/11/23 174 lb (78.9 kg)  07/28/23 174 lb 12.8 oz (79.3 kg)  07/14/23 177 lb 0.5 oz (80.3 kg)    Body mass index is 30.82 kg/m.  Performance status (ECOG): 1 - Symptomatic but completely ambulatory  PHYSICAL EXAM:   Physical Exam Vitals and nursing note reviewed. Exam conducted with a chaperone present.  Constitutional:      Appearance: Normal appearance.  Cardiovascular:     Rate and Rhythm: Normal rate and regular rhythm.     Pulses: Normal pulses.     Heart sounds: Normal heart sounds.  Pulmonary:     Effort: Pulmonary effort is normal.     Breath sounds: Normal breath sounds.  Abdominal:     Palpations: Abdomen is soft. There is no hepatomegaly, splenomegaly or mass.     Tenderness: There is no abdominal tenderness.  Musculoskeletal:     Right lower leg: No edema.     Left lower leg: No edema.  Lymphadenopathy:     Cervical: No cervical adenopathy.     Right cervical: No superficial, deep or posterior cervical adenopathy.    Left cervical: No superficial, deep or posterior cervical adenopathy.     Upper Body:     Right upper body: No supraclavicular or axillary adenopathy.     Left upper body: No supraclavicular or axillary adenopathy.  Neurological:     General: No focal deficit present.     Mental Status: She is alert and oriented to person, place, and time.  Psychiatric:        Mood and Affect: Mood normal.        Behavior: Behavior normal.     LABS:   CBC     Component Value Date/Time   WBC 2.7 (L) 08/11/2023 0926    RBC 3.61 (L) 08/11/2023 0926   HGB 11.6 (L) 08/11/2023 0926   HGB 14.9 03/05/2016 1201   HCT 34.5 (L) 08/11/2023 0926   HCT 43.4 03/05/2016 1201   PLT 114 (L) 08/11/2023 0926   PLT 340 03/05/2016 1201   MCV 95.6 08/11/2023 0926   MCV 90 03/05/2016 1201   MCH 32.1 08/11/2023 0926   MCHC 33.6 08/11/2023 0926   RDW 17.6 (H) 08/11/2023 0926   RDW 12.8 03/05/2016 1201   LYMPHSABS 0.9 08/11/2023 0926   LYMPHSABS 1.0 03/05/2016 1201   MONOABS 0.4 08/11/2023 0926   EOSABS 0.1 08/11/2023 0926   EOSABS 0.0 03/05/2016 1201   BASOSABS 0.0 08/11/2023 0926   BASOSABS 0.0 03/05/2016 1201    CMP      Component Value Date/Time   NA 136 08/11/2023 0926   NA 140 03/05/2016 1201   K 3.6 08/11/2023 0926   CL 102 08/11/2023 0926   CO2 23 08/11/2023 0926   GLUCOSE 158 (H) 08/11/2023 0926   BUN 19 08/11/2023 0926   BUN 10 03/05/2016 1201   CREATININE 0.91  08/11/2023 0926   CALCIUM 9.2 08/11/2023 0926   PROT 7.8 08/11/2023 0926   PROT 7.4 03/05/2016 1201   ALBUMIN 3.6 08/11/2023 0926   ALBUMIN 4.0 03/05/2016 1201   AST 55 (H) 08/11/2023 0926   ALT 40 08/11/2023 0926   ALKPHOS 131 (H) 08/11/2023 0926   BILITOT 1.1 08/11/2023 0926   BILITOT 0.3 03/05/2016 1201   GFRNONAA >60 08/11/2023 0926   GFRAA >60 04/01/2016 0541     Lab Results  Component Value Date   CEA1 27.5 (H) 05/24/2023   /  CEA  Date Value Ref Range Status  05/24/2023 27.5 (H) 0.0 - 4.7 ng/mL Final    Comment:    (NOTE)                             Nonsmokers          <3.9                             Smokers             <5.6 Roche [employer]ostics Electrochemiluminescence Immunoassay (ECLIA) Values obtained with different assay methods or kits cannot be used interchangeably.  Results cannot be interpreted as absolute evidence of the presence or absence of malignant disease. Performed At: [facility] [address] [place], [place] [id number] Jolene Schimke MD WU:[id number]    No results found for:  "PSA1" No results found for: "CAN199" No results found for: "CAN125"  No results found for: "TOTALPROTELP", "ALBUMINELP", "A1GS", "A2GS", "BETS", "BETA2SER", "GAMS", "MSPIKE", "SPEI" Lab Results  Component Value Date   TIBC 248 (L) 04/29/2020   FERRITIN 57 04/29/2020   IRONPCTSAT 3 (L) 04/29/2020   No results found for: "LDH"   STUDIES:   No results found.

## 2023-08-11 ENCOUNTER — Inpatient Hospital Stay: Payer: No Typology Code available for payment source

## 2023-08-11 ENCOUNTER — Inpatient Hospital Stay: Payer: No Typology Code available for payment source | Attending: Hematology

## 2023-08-11 ENCOUNTER — Inpatient Hospital Stay (HOSPITAL_BASED_OUTPATIENT_CLINIC_OR_DEPARTMENT_OTHER): Payer: No Typology Code available for payment source | Admitting: Hematology

## 2023-08-11 VITALS — BP 150/75 | HR 70 | Temp 97.7°F | Resp 18 | Ht 63.0 in | Wt 174.0 lb

## 2023-08-11 VITALS — BP 148/74 | HR 79 | Temp 96.7°F | Resp 20

## 2023-08-11 DIAGNOSIS — C189 Malignant neoplasm of colon, unspecified: Secondary | ICD-10-CM | POA: Diagnosis not present

## 2023-08-11 DIAGNOSIS — E1142 Type 2 diabetes mellitus with diabetic polyneuropathy: Secondary | ICD-10-CM | POA: Diagnosis not present

## 2023-08-11 DIAGNOSIS — Z9221 Personal history of antineoplastic chemotherapy: Secondary | ICD-10-CM | POA: Insufficient documentation

## 2023-08-11 DIAGNOSIS — C187 Malignant neoplasm of sigmoid colon: Secondary | ICD-10-CM | POA: Insufficient documentation

## 2023-08-11 DIAGNOSIS — C787 Secondary malignant neoplasm of liver and intrahepatic bile duct: Secondary | ICD-10-CM | POA: Diagnosis not present

## 2023-08-11 DIAGNOSIS — D509 Iron deficiency anemia, unspecified: Secondary | ICD-10-CM | POA: Insufficient documentation

## 2023-08-11 DIAGNOSIS — I10 Essential (primary) hypertension: Secondary | ICD-10-CM | POA: Diagnosis not present

## 2023-08-11 DIAGNOSIS — R918 Other nonspecific abnormal finding of lung field: Secondary | ICD-10-CM | POA: Insufficient documentation

## 2023-08-11 DIAGNOSIS — Z933 Colostomy status: Secondary | ICD-10-CM | POA: Insufficient documentation

## 2023-08-11 DIAGNOSIS — Z5111 Encounter for antineoplastic chemotherapy: Secondary | ICD-10-CM | POA: Insufficient documentation

## 2023-08-11 DIAGNOSIS — Z95828 Presence of other vascular implants and grafts: Secondary | ICD-10-CM

## 2023-08-11 DIAGNOSIS — E876 Hypokalemia: Secondary | ICD-10-CM | POA: Insufficient documentation

## 2023-08-11 DIAGNOSIS — D72819 Decreased white blood cell count, unspecified: Secondary | ICD-10-CM | POA: Insufficient documentation

## 2023-08-11 DIAGNOSIS — Z87891 Personal history of nicotine dependence: Secondary | ICD-10-CM | POA: Diagnosis not present

## 2023-08-11 DIAGNOSIS — Z79899 Other long term (current) drug therapy: Secondary | ICD-10-CM | POA: Insufficient documentation

## 2023-08-11 LAB — CBC WITH DIFFERENTIAL/PLATELET
Abs Immature Granulocytes: 0 10*3/uL (ref 0.00–0.07)
Basophils Absolute: 0 10*3/uL (ref 0.0–0.1)
Basophils Relative: 1 %
Eosinophils Absolute: 0.1 10*3/uL (ref 0.0–0.5)
Eosinophils Relative: 3 %
HCT: 34.5 % — ABNORMAL LOW (ref 36.0–46.0)
Hemoglobin: 11.6 g/dL — ABNORMAL LOW (ref 12.0–15.0)
Immature Granulocytes: 0 %
Lymphocytes Relative: 34 %
Lymphs Abs: 0.9 10*3/uL (ref 0.7–4.0)
MCH: 32.1 pg (ref 26.0–34.0)
MCHC: 33.6 g/dL (ref 30.0–36.0)
MCV: 95.6 fL (ref 80.0–100.0)
Monocytes Absolute: 0.4 10*3/uL (ref 0.1–1.0)
Monocytes Relative: 16 %
Neutro Abs: 1.2 10*3/uL — ABNORMAL LOW (ref 1.7–7.7)
Neutrophils Relative %: 46 %
Platelets: 114 10*3/uL — ABNORMAL LOW (ref 150–400)
RBC: 3.61 MIL/uL — ABNORMAL LOW (ref 3.87–5.11)
RDW: 17.6 % — ABNORMAL HIGH (ref 11.5–15.5)
WBC: 2.7 10*3/uL — ABNORMAL LOW (ref 4.0–10.5)
nRBC: 0 % (ref 0.0–0.2)

## 2023-08-11 LAB — COMPREHENSIVE METABOLIC PANEL
ALT: 40 U/L (ref 0–44)
AST: 55 U/L — ABNORMAL HIGH (ref 15–41)
Albumin: 3.6 g/dL (ref 3.5–5.0)
Alkaline Phosphatase: 131 U/L — ABNORMAL HIGH (ref 38–126)
Anion gap: 11 (ref 5–15)
BUN: 19 mg/dL (ref 8–23)
CO2: 23 mmol/L (ref 22–32)
Calcium: 9.2 mg/dL (ref 8.9–10.3)
Chloride: 102 mmol/L (ref 98–111)
Creatinine, Ser: 0.91 mg/dL (ref 0.44–1.00)
GFR, Estimated: >60 mL/min (ref >60)
Glucose, Bld: 158 mg/dL — ABNORMAL HIGH (ref 70–99)
Potassium: 3.6 mmol/L (ref 3.5–5.1)
Sodium: 136 mmol/L (ref 135–145)
Total Bilirubin: 1.1 mg/dL (ref 0.0–1.2)
Total Protein: 7.8 g/dL (ref 6.5–8.1)

## 2023-08-11 LAB — MAGNESIUM: Magnesium: 2.2 mg/dL (ref 1.7–2.4)

## 2023-08-11 MED ORDER — DEXAMETHASONE SODIUM PHOSPHATE 10 MG/ML IJ SOLN
10.0000 mg | Freq: Once | INTRAMUSCULAR | Status: AC
  Administered 2023-08-11: 10 mg via INTRAVENOUS
  Filled 2023-08-11: qty 1

## 2023-08-11 MED ORDER — SODIUM CHLORIDE 0.9 % IV SOLN
5.0000 mg/kg | Freq: Once | INTRAVENOUS | Status: AC
  Administered 2023-08-11: 400 mg via INTRAVENOUS
  Filled 2023-08-11: qty 16

## 2023-08-11 MED ORDER — SODIUM CHLORIDE FLUSH 0.9 % IV SOLN
10.0000 mL | Freq: Once | INTRAVENOUS | Status: AC
  Administered 2023-08-11: 10 mL via INTRAVENOUS
  Filled 2023-08-11: qty 10

## 2023-08-11 MED ORDER — ALTEPLASE 2 MG IJ SOLR
2.0000 mg | Freq: Once | INTRAMUSCULAR | Status: AC
  Administered 2023-08-11: 2 mg
  Filled 2023-08-11: qty 2

## 2023-08-11 MED ORDER — PALONOSETRON HCL INJECTION 0.25 MG/5ML
0.2500 mg | Freq: Once | INTRAVENOUS | Status: AC
  Administered 2023-08-11: 0.25 mg via INTRAVENOUS
  Filled 2023-08-11: qty 5

## 2023-08-11 MED ORDER — ATROPINE SULFATE 1 MG/ML IV SOLN
0.5000 mg | Freq: Once | INTRAVENOUS | Status: AC
  Administered 2023-08-11: 0.5 mg via INTRAVENOUS
  Filled 2023-08-11: qty 1

## 2023-08-11 MED ORDER — SODIUM CHLORIDE 0.9 % IV SOLN
1920.0000 mg/m2 | INTRAVENOUS | Status: DC
  Administered 2023-08-11: 3500 mg via INTRAVENOUS
  Filled 2023-08-11: qty 70

## 2023-08-11 MED ORDER — FLUOROURACIL CHEMO INJECTION 2.5 GM/50ML
320.0000 mg/m2 | Freq: Once | INTRAVENOUS | Status: AC
  Administered 2023-08-11: 600 mg via INTRAVENOUS
  Filled 2023-08-11: qty 12

## 2023-08-11 MED ORDER — SODIUM CHLORIDE 0.9 % IV SOLN
320.0000 mg/m2 | Freq: Once | INTRAVENOUS | Status: AC
  Administered 2023-08-11: 608 mg via INTRAVENOUS
  Filled 2023-08-11: qty 30.4

## 2023-08-11 MED ORDER — SODIUM CHLORIDE 0.9 % IV SOLN
144.0000 mg/m2 | Freq: Once | INTRAVENOUS | Status: AC
  Administered 2023-08-11: 300 mg via INTRAVENOUS
  Filled 2023-08-11: qty 15

## 2023-08-11 MED ORDER — SODIUM CHLORIDE 0.9 % IV SOLN: INTRAVENOUS | Status: DC

## 2023-08-11 NOTE — Patient Instructions (Signed)
[facility] [place] - A DEPT OF [facility]  Discharge Instructions: Thank you for choosing [facility] to provide your oncology and hematology care.  If you have a lab appointment with the Cancer Center - please note that after April 8th, 2024, all labs will be drawn in the cancer center.  You do not have to check in or register with the main entrance as you have in the past but will complete your check-in in the cancer center.  Wear comfortable clothing and clothing appropriate for easy access to any Portacath or PICC line.   We strive to give you quality time with your provider. You may need to reschedule your appointment if you arrive late (15 or more minutes).  Arriving late affects you and other patients whose appointments are after yours.  Also, if you miss three or more appointments without notifying the office, you may be dismissed from the clinic at the provider's discretion.      For prescription refill requests, have your pharmacy contact our office and allow 72 hours for refills to be completed.    Today you received the following chemotherapy and/or immunotherapy agents avastin, irinotecan, leucovorin, adrucil   To help prevent nausea and vomiting after your treatment, we encourage you to take your nausea medication as directed.  BELOW ARE SYMPTOMS THAT SHOULD BE REPORTED IMMEDIATELY: *FEVER GREATER THAN 100.4 F (38 C) OR HIGHER *CHILLS OR SWEATING *NAUSEA AND VOMITING THAT IS NOT CONTROLLED WITH YOUR NAUSEA MEDICATION *UNUSUAL SHORTNESS OF BREATH *UNUSUAL BRUISING OR BLEEDING *URINARY PROBLEMS (pain or burning when urinating, or frequent urination) *BOWEL PROBLEMS (unusual diarrhea, constipation, pain near the anus) TENDERNESS IN MOUTH AND THROAT WITH OR WITHOUT PRESENCE OF ULCERS (sore throat, sores in mouth, or a toothache) UNUSUAL RASH, SWELLING OR PAIN  UNUSUAL VAGINAL DISCHARGE OR ITCHING   Items with * indicate a potential  emergency and should be followed up as soon as possible or go to the Emergency Department if any problems should occur.  Please show the CHEMOTHERAPY ALERT CARD or IMMUNOTHERAPY ALERT CARD at check-in to the Emergency Department and triage nurse.  Should you have questions after your visit or need to cancel or reschedule your appointment, please contact [facility] [place] - A DEPT OF [facility] [phone]  and follow the prompts.  Office hours are 8:00 a.m. to 4:30 p.m. Monday - Friday. Please note that voicemails left after 4:00 p.m. may not be returned until the following business day.  We are closed weekends and major holidays. You have access to a nurse at all times for urgent questions. Please call the main number to the clinic [phone] and follow the prompts.  For any non-urgent questions, you may also contact your provider using MyChart. We now offer e-Visits for anyone 46 and older to request care online for non-urgent symptoms. For details visit [email or web address].   Also download the MyChart app! Go to the app store, search "MyChart", open the app, select [facility], and log in with your MyChart username and password.

## 2023-08-11 NOTE — Progress Notes (Signed)
8 cc blood return noted after alteplase instillation. Blood discarded and flushed w/NS 10 cc.  Patient has been examined by Dr. Ellin Saba. Vital signs and labs have been reviewed by MD - ANC (1.2), Creatinine, LFTs, hemoglobin, and platelets are within treatment parameters per M.D. - pt may proceed with treatment. Will add long acting GCSF to D3 per MD. Primary RN and pharmacy notified.

## 2023-08-11 NOTE — Progress Notes (Signed)
Patient tolerated chemotherapy with no complaints voiced.  Side effects with management reviewed with understanding verbalized.  Port site clean and dry with no bruising or swelling noted at site.  Good blood return noted before and after administration of chemotherapy. 5FU pump started for home use. Pt due to return to clinic on 08/12/22 for pump stop. Patient left in satisfactory condition with VSS and no s/s of distress noted. All follow ups as scheduled.   Susan Martin [employer]

## 2023-08-11 NOTE — Progress Notes (Signed)
Port gives no blood return, alteplase placed at 0941, Primary RN made aware.

## 2023-08-12 LAB — CEA: CEA: 25.6 ng/mL — ABNORMAL HIGH (ref 0.0–4.7)

## 2023-08-13 ENCOUNTER — Inpatient Hospital Stay: Payer: No Typology Code available for payment source

## 2023-08-13 VITALS — BP 148/76 | HR 70 | Temp 97.0°F | Resp 18

## 2023-08-13 DIAGNOSIS — Z5111 Encounter for antineoplastic chemotherapy: Secondary | ICD-10-CM | POA: Diagnosis not present

## 2023-08-13 DIAGNOSIS — C189 Malignant neoplasm of colon, unspecified: Secondary | ICD-10-CM

## 2023-08-13 MED ORDER — SODIUM CHLORIDE 0.9% FLUSH
10.0000 mL | INTRAVENOUS | Status: DC | PRN
  Administered 2023-08-13: 10 mL

## 2023-08-13 MED ORDER — HEPARIN SOD (PORK) LOCK FLUSH 100 UNIT/ML IV SOLN
500.0000 [IU] | Freq: Once | INTRAVENOUS | Status: AC | PRN
  Administered 2023-08-13: 500 [IU]

## 2023-08-13 MED ORDER — PEGFILGRASTIM-JMDB 6 MG/0.6ML ~~LOC~~ SOSY
6.0000 mg | PREFILLED_SYRINGE | Freq: Once | SUBCUTANEOUS | Status: AC
  Administered 2023-08-13: 6 mg via SUBCUTANEOUS

## 2023-08-13 NOTE — Patient Instructions (Signed)
[facility] [place] - A DEPT OF [facility]  Discharge Instructions: Thank you for choosing [facility] to provide your oncology and hematology care.  If you have a lab appointment with the Cancer Center - please note that after April 8th, 2024, all labs will be drawn in the cancer center.  You do not have to check in or register with the main entrance as you have in the past but will complete your check-in in the cancer center.  Wear comfortable clothing and clothing appropriate for easy access to any Portacath or PICC line.   We strive to give you quality time with your provider. You may need to reschedule your appointment if you arrive late (15 or more minutes).  Arriving late affects you and other patients whose appointments are after yours.  Also, if you miss three or more appointments without notifying the office, you may be dismissed from the clinic at the provider's discretion.      For prescription refill requests, have your pharmacy contact our office and allow 72 hours for refills to be completed.    Today you received the following chemotherapy and/or immunotherapy agents fulphila pump stop      To help prevent nausea and vomiting after your treatment, we encourage you to take your nausea medication as directed.  BELOW ARE SYMPTOMS THAT SHOULD BE REPORTED IMMEDIATELY: *FEVER GREATER THAN 100.4 F (38 C) OR HIGHER *CHILLS OR SWEATING *NAUSEA AND VOMITING THAT IS NOT CONTROLLED WITH YOUR NAUSEA MEDICATION *UNUSUAL SHORTNESS OF BREATH *UNUSUAL BRUISING OR BLEEDING *URINARY PROBLEMS (pain or burning when urinating, or frequent urination) *BOWEL PROBLEMS (unusual diarrhea, constipation, pain near the anus) TENDERNESS IN MOUTH AND THROAT WITH OR WITHOUT PRESENCE OF ULCERS (sore throat, sores in mouth, or a toothache) UNUSUAL RASH, SWELLING OR PAIN  UNUSUAL VAGINAL DISCHARGE OR ITCHING   Items with * indicate a potential emergency and should be  followed up as soon as possible or go to the Emergency Department if any problems should occur.  Please show the CHEMOTHERAPY ALERT CARD or IMMUNOTHERAPY ALERT CARD at check-in to the Emergency Department and triage nurse.  Should you have questions after your visit or need to cancel or reschedule your appointment, please contact [facility] [place] - A DEPT OF Eligha Bridegroom [facility] [phone]  and follow the prompts.  Office hours are 8:00 a.m. to 4:30 p.m. Monday - Friday. Please note that voicemails left after 4:00 p.m. may not be returned until the following business day.  We are closed weekends and major holidays. You have access to a nurse at all times for urgent questions. Please call the main number to the clinic [phone] and follow the prompts.  For any non-urgent questions, you may also contact your provider using MyChart. We now offer e-Visits for anyone 76 and older to request care online for non-urgent symptoms. For details visit [email or web address].   Also download the MyChart app! Go to the app store, search "MyChart", open the app, select [place], and log in with your MyChart username and password.

## 2023-08-13 NOTE — Progress Notes (Signed)
Patient presents today for 5FU pump stop and disconnection after 46 hour continous infusion.   5FU pump deaccessed.  Patients port flushed without difficulty.  Good blood return noted with no bruising or swelling noted at site.  Needle removed intact.  Band aid applied. Fulphila administration without incident; injection site WNL; see MAR for injection details.  Patient tolerated procedure well and without incident.  No questions or complaints noted at this time. VSS with discharge and left in satisfactory condition via wheelchair with no s/s of distress noted.

## 2023-08-25 ENCOUNTER — Encounter: Payer: Self-pay | Admitting: Hematology

## 2023-08-25 ENCOUNTER — Inpatient Hospital Stay: Payer: No Typology Code available for payment source | Admitting: Hematology

## 2023-08-25 ENCOUNTER — Inpatient Hospital Stay: Payer: No Typology Code available for payment source

## 2023-08-25 VITALS — BP 156/70 | HR 76 | Temp 97.0°F | Resp 18

## 2023-08-25 VITALS — BP 141/70 | HR 81 | Temp 97.0°F | Resp 18 | Wt 175.0 lb

## 2023-08-25 DIAGNOSIS — C189 Malignant neoplasm of colon, unspecified: Secondary | ICD-10-CM

## 2023-08-25 DIAGNOSIS — C20 Malignant neoplasm of rectum: Secondary | ICD-10-CM | POA: Diagnosis not present

## 2023-08-25 DIAGNOSIS — Z5111 Encounter for antineoplastic chemotherapy: Secondary | ICD-10-CM | POA: Diagnosis not present

## 2023-08-25 DIAGNOSIS — Z95828 Presence of other vascular implants and grafts: Secondary | ICD-10-CM

## 2023-08-25 LAB — COMPREHENSIVE METABOLIC PANEL
ALT: 25 U/L (ref 0–44)
AST: 39 U/L (ref 15–41)
Albumin: 3.5 g/dL (ref 3.5–5.0)
Alkaline Phosphatase: 160 U/L — ABNORMAL HIGH (ref 38–126)
Anion gap: 14 (ref 5–15)
BUN: 15 mg/dL (ref 8–23)
CO2: 24 mmol/L (ref 22–32)
Calcium: 9 mg/dL (ref 8.9–10.3)
Chloride: 96 mmol/L — ABNORMAL LOW (ref 98–111)
Creatinine, Ser: 0.98 mg/dL (ref 0.44–1.00)
GFR, Estimated: >60 mL/min (ref >60)
Glucose, Bld: 145 mg/dL — ABNORMAL HIGH (ref 70–99)
Potassium: 3.1 mmol/L — ABNORMAL LOW (ref 3.5–5.1)
Sodium: 134 mmol/L — ABNORMAL LOW (ref 135–145)
Total Bilirubin: 1.1 mg/dL (ref 0.0–1.2)
Total Protein: 7.6 g/dL (ref 6.5–8.1)

## 2023-08-25 LAB — CBC WITH DIFFERENTIAL/PLATELET
Abs Immature Granulocytes: 0.19 10*3/uL — ABNORMAL HIGH (ref 0.00–0.07)
Basophils Absolute: 0.1 10*3/uL (ref 0.0–0.1)
Basophils Relative: 1 %
Eosinophils Absolute: 0.1 10*3/uL (ref 0.0–0.5)
Eosinophils Relative: 1 %
HCT: 32.7 % — ABNORMAL LOW (ref 36.0–46.0)
Hemoglobin: 10.8 g/dL — ABNORMAL LOW (ref 12.0–15.0)
Immature Granulocytes: 2 %
Lymphocytes Relative: 17 %
Lymphs Abs: 1.7 10*3/uL (ref 0.7–4.0)
MCH: 32.4 pg (ref 26.0–34.0)
MCHC: 33 g/dL (ref 30.0–36.0)
MCV: 98.2 fL (ref 80.0–100.0)
Monocytes Absolute: 0.9 10*3/uL (ref 0.1–1.0)
Monocytes Relative: 9 %
Neutro Abs: 7.1 10*3/uL (ref 1.7–7.7)
Neutrophils Relative %: 70 %
Platelets: 101 10*3/uL — ABNORMAL LOW (ref 150–400)
RBC: 3.33 MIL/uL — ABNORMAL LOW (ref 3.87–5.11)
RDW: 20.4 % — ABNORMAL HIGH (ref 11.5–15.5)
WBC: 10.1 10*3/uL (ref 4.0–10.5)
nRBC: 0 % (ref 0.0–0.2)

## 2023-08-25 LAB — URINALYSIS, DIPSTICK ONLY
Bilirubin Urine: NEGATIVE
Glucose, UA: NEGATIVE mg/dL
Hgb urine dipstick: NEGATIVE
Ketones, ur: NEGATIVE mg/dL
Leukocytes,Ua: NEGATIVE
Nitrite: NEGATIVE
Protein, ur: 30 mg/dL — AB
Specific Gravity, Urine: 1.014 (ref 1.005–1.030)
pH: 5 (ref 5.0–8.0)

## 2023-08-25 LAB — MAGNESIUM: Magnesium: 2.2 mg/dL (ref 1.7–2.4)

## 2023-08-25 MED ORDER — POTASSIUM CHLORIDE CRYS ER 20 MEQ PO TBCR
40.0000 meq | EXTENDED_RELEASE_TABLET | Freq: Once | ORAL | Status: AC
  Administered 2023-08-25: 40 meq via ORAL
  Filled 2023-08-25: qty 2

## 2023-08-25 MED ORDER — SODIUM CHLORIDE 0.9 % IV SOLN
320.0000 mg/m2 | Freq: Once | INTRAVENOUS | Status: AC
  Administered 2023-08-25: 608 mg via INTRAVENOUS
  Filled 2023-08-25: qty 30.4

## 2023-08-25 MED ORDER — SODIUM CHLORIDE FLUSH 0.9 % IV SOLN
10.0000 mL | Freq: Once | INTRAVENOUS | Status: AC
  Administered 2023-08-25: 10 mL via INTRAVENOUS
  Filled 2023-08-25: qty 10

## 2023-08-25 MED ORDER — SODIUM CHLORIDE 0.9 % IV SOLN
5.0000 mg/kg | Freq: Once | INTRAVENOUS | Status: AC
  Administered 2023-08-25: 400 mg via INTRAVENOUS
  Filled 2023-08-25: qty 16

## 2023-08-25 MED ORDER — SODIUM CHLORIDE 0.9 % IV SOLN
144.0000 mg/m2 | Freq: Once | INTRAVENOUS | Status: AC
  Administered 2023-08-25: 300 mg via INTRAVENOUS
  Filled 2023-08-25: qty 15

## 2023-08-25 MED ORDER — FLUOROURACIL CHEMO INJECTION 2.5 GM/50ML
320.0000 mg/m2 | Freq: Once | INTRAVENOUS | Status: AC
  Administered 2023-08-25: 600 mg via INTRAVENOUS
  Filled 2023-08-25: qty 12

## 2023-08-25 MED ORDER — SODIUM CHLORIDE 0.9 % IV SOLN: INTRAVENOUS | Status: DC

## 2023-08-25 MED ORDER — ATROPINE SULFATE 1 MG/ML IV SOLN
0.5000 mg | Freq: Once | INTRAVENOUS | Status: AC
  Administered 2023-08-25: 0.5 mg via INTRAVENOUS
  Filled 2023-08-25: qty 1

## 2023-08-25 MED ORDER — PALONOSETRON HCL INJECTION 0.25 MG/5ML
0.2500 mg | Freq: Once | INTRAVENOUS | Status: AC
  Administered 2023-08-25: 0.25 mg via INTRAVENOUS
  Filled 2023-08-25: qty 5

## 2023-08-25 MED ORDER — DEXAMETHASONE SODIUM PHOSPHATE 10 MG/ML IJ SOLN
10.0000 mg | Freq: Once | INTRAMUSCULAR | Status: AC
  Administered 2023-08-25: 10 mg via INTRAVENOUS
  Filled 2023-08-25: qty 1

## 2023-08-25 MED ORDER — SODIUM CHLORIDE 0.9 % IV SOLN
1920.0000 mg/m2 | INTRAVENOUS | Status: DC
  Administered 2023-08-25: 3500 mg via INTRAVENOUS
  Filled 2023-08-25: qty 70

## 2023-08-25 NOTE — Patient Instructions (Signed)
 [facility] [place] - [facility] DE[facility]  Discharge Instructions: Thank you for choosing [facility] to provide your oncology and hematology care.  If you have a lab appointment with the Cancer Center - please note that after April 8th, 2024, all labs will be drawn in the cancer center.  You do not have to check in or register with the main entrance as you have in the past but will complete your check-in in the cancer center.  Wear comfortable clothing and clothing appropriate for easy access to any Portacath or PICC line.   We strive to give you quality time with your provider. You may need to reschedule your appointment if you arrive late (15 or more minutes).  Arriving late affects you and other patients whose appointments are after yours.  Also, if you miss three or more appointments without notifying the office, you may be dismissed from the clinic at the provider's discretion.      For prescription refill requests, have your pharmacy contact our office and allow 72 hours for refills to be completed.    Today you received the following chemotherapy and/or immunotherapy agents MVASI/Folfiri   To help prevent nausea and vomiting after your treatment, we encourage you to take your nausea medication as directed.   BELOW ARE SYMPTOMS THAT SHOULD BE REPORTED IMMEDIATELY: *FEVER GREATER THAN 100.4 F (38 C) OR HIGHER *CHILLS OR SWEATING *NAUSEA AND VOMITING THAT IS NOT CONTROLLED WITH YOUR NAUSEA MEDICATION *UNUSUAL SHORTNESS OF BREATH *UNUSUAL BRUISING OR BLEEDING *URINARY PROBLEMS (pain or burning when urinating, or frequent urination) *BOWEL PROBLEMS (unusual diarrhea, constipation, pain near the anus) TENDERNESS IN MOUTH AND THROAT WITH OR WITHOUT PRESENCE OF ULCERS (sore throat, sores in mouth, or a toothache) UNUSUAL RASH, SWELLING OR PAIN  UNUSUAL VAGINAL DISCHARGE OR ITCHING   Items with * indicate a potential emergency and should be followed  up as soon as possible or go to the Emergency Department if any problems should occur.  Please show the CHEMOTHERAPY ALERT CARD or IMMUNOTHERAPY ALERT CARD at check-in to the Emergency Department and triage nurse.  Should you have questions after your visit or need to cancel or reschedule your appointment, please contact [facility]ER [facility] [place] - A DEPT OF Eligha Bridegroom [facility] [phone]  and follow the prompts.  Office hours are 8:00 a.m. to 4:30 p.m. Monday - Friday. Please note that voicemails left after 4:00 p.m. may not be returned until the following business day.  We are closed weekends and major holidays. You have access to a nurse at all times for urgent questions. Please call the main number to the clinic [phone] and follow the prompts.  For any non-urgent questions, you may also contact your provider using MyChart. We now offer e-Visits for anyone 8 and older to request care online for non-urgent symptoms. For details visit [email or web address].   Also download the MyChart app! Go to the app store, search "MyChart", open the app, select [place], and log in with your MyChart username and password.

## 2023-08-25 NOTE — Progress Notes (Signed)
 Patient presents today for MVASI/Folfiri infusion.  Patient is in satisfactory condition with no new complaints voiced.  Vital signs are stable.  Labs reviewed and all labs are within treatment parameters. Patient will receive 40 mEq potassium chloride p.o x 1 dose per Dr.Katragadda's standing orders. We will proceed with treatment per MD orders.    Treatment given today per MD orders. Tolerated infusion without adverse affects. Vital signs stable. No complaints at this time. Discharged from clinic ambulatory in stable condition. Alert and oriented x 3. F/U with [facility] as scheduled.  5FU ambulatory pump infusion with no alarms beeping.

## 2023-08-26 LAB — CEA: CEA: 24.3 ng/mL — ABNORMAL HIGH (ref 0.0–4.7)

## 2023-08-27 ENCOUNTER — Inpatient Hospital Stay: Payer: No Typology Code available for payment source

## 2023-08-27 VITALS — BP 136/74 | HR 68 | Temp 98.3°F | Resp 18

## 2023-08-27 DIAGNOSIS — Z5111 Encounter for antineoplastic chemotherapy: Secondary | ICD-10-CM | POA: Diagnosis not present

## 2023-08-27 DIAGNOSIS — C189 Malignant neoplasm of colon, unspecified: Secondary | ICD-10-CM

## 2023-08-27 MED ORDER — SODIUM CHLORIDE 0.9% FLUSH
10.0000 mL | INTRAVENOUS | Status: DC | PRN
  Administered 2023-08-27: 10 mL

## 2023-08-27 MED ORDER — HEPARIN SOD (PORK) LOCK FLUSH 100 UNIT/ML IV SOLN
500.0000 [IU] | Freq: Once | INTRAVENOUS | Status: AC | PRN
  Administered 2023-08-27: 500 [IU]

## 2023-08-27 MED ORDER — PEGFILGRASTIM-JMDB 6 MG/0.6ML ~~LOC~~ SOSY
6.0000 mg | PREFILLED_SYRINGE | Freq: Once | SUBCUTANEOUS | Status: AC
  Administered 2023-08-27: 6 mg via SUBCUTANEOUS
  Filled 2023-08-27: qty 0.6

## 2023-08-27 NOTE — Progress Notes (Signed)
 Patient presents today for 5FU pump stop and disconnection after 46 hour continous infusion.   5FU pump deaccessed.  Patients port flushed without difficulty.  Good blood return noted with no bruising or swelling noted at site.  Needle removed intact.  Band aid applied.  VSS with discharge and left in satisfactory condition via wheelchair with no s/s of distress noted.

## 2023-08-27 NOTE — Patient Instructions (Signed)
 [facility] [place] [facility]  Discharge Instructions: Thank you for choosing [facility] to provide your oncology and hematology care.  If you have a lab appointment with the Cancer Center - please note that after April 8th, 2024, all labs will be drawn in the cancer center.  You do not have to check in or register with the main entrance as you have in the past but will complete your check-in in the cancer center.  Wear comfortable clothing and clothing appropriate for easy access to any Portacath or PICC line.   We strive to give you quality time with your provider. You may need to reschedule your appointment if you arrive late (15 or more minutes).  Arriving late affects you and other patients whose appointments are after yours.  Also, if you miss three or more appointments without notifying the office, you may be dismissed from the clinic at the provider's discretion.      For prescription refill requests, have your pharmacy contact our office and allow 72 hours for refills to be completed.    Today you received the following chemotherapy and/or immunotherapy agents Fulphila      To help prevent nausea and vomiting after your treatment, we encourage you to take your nausea medication as directed.  BELOW ARE SYMPTOMS THAT SHOULD BE REPORTED IMMEDIATELY: *FEVER GREATER THAN 100.4 F (38 C) OR HIGHER *CHILLS OR SWEATING *NAUSEA AND VOMITING THAT IS NOT CONTROLLED WITH YOUR NAUSEA MEDICATION *UNUSUAL SHORTNESS OF BREATH *UNUSUAL BRUISING OR BLEEDING *URINARY PROBLEMS (pain or burning when urinating, or frequent urination) *BOWEL PROBLEMS (unusual diarrhea, constipation, pain near the anus) TENDERNESS IN MOUTH AND THROAT WITH OR WITHOUT PRESENCE OF ULCERS (sore throat, sores in mouth, or a toothache) UNUSUAL RASH, SWELLING OR PAIN  UNUSUAL VAGINAL DISCHARGE OR ITCHING   Items with * indicate a potential emergency and should be followed up  as soon as possible or go to the Emergency Department if any problems should occur.  Please show the CHEMOTHERAPY ALERT CARD or IMMUNOTHERAPY ALERT CARD at check-in to the Emergency Department and triage nurse.  Should you have questions after your visit or need to cancel or reschedule your appointment, please contact [facility]CER [facility] [place] - A DEPT OF Eligha Bridegroom [facility] [phone]  and follow the prompts.  Office hours are 8:00 a.m. to 4:30 p.m. Monday - Friday. Please note that voicemails left after 4:00 p.m. may not be returned until the following business day.  We are closed weekends and major holidays. You have access to a nurse at all times for urgent questions. Please call the main number to the clinic [phone] and follow the prompts.  For any non-urgent questions, you may also contact your provider using MyChart. We now offer e-Visits for anyone 31 and older to request care online for non-urgent symptoms. For details visit [email or web address].   Also download the MyChart app! Go to the app store, search "MyChart", open the app, select [place], and log in with your MyChart username and password.

## 2023-09-01 ENCOUNTER — Ambulatory Visit (HOSPITAL_COMMUNITY)
Admission: RE | Admit: 2023-09-01 | Discharge: 2023-09-01 | Disposition: A | Discharge status: Home / Self Care | Payer: No Typology Code available for payment source | Source: Ambulatory Visit | Attending: Hematology | Admitting: Hematology

## 2023-09-01 DIAGNOSIS — I700 Atherosclerosis of aorta: Secondary | ICD-10-CM | POA: Diagnosis not present

## 2023-09-01 DIAGNOSIS — C787 Secondary malignant neoplasm of liver and intrahepatic bile duct: Secondary | ICD-10-CM | POA: Diagnosis not present

## 2023-09-01 DIAGNOSIS — R162 Hepatomegaly with splenomegaly, not elsewhere classified: Secondary | ICD-10-CM | POA: Diagnosis not present

## 2023-09-01 DIAGNOSIS — C189 Malignant neoplasm of colon, unspecified: Secondary | ICD-10-CM | POA: Insufficient documentation

## 2023-09-01 MED ORDER — IOHEXOL 300 MG/ML  SOLN
100.0000 mL | Freq: Once | INTRAMUSCULAR | Status: AC | PRN
  Administered 2023-09-01: 100 mL via INTRAVENOUS

## 2023-09-01 MED ORDER — IOHEXOL 300 MG/ML  SOLN
30.0000 mL | Freq: Once | INTRAMUSCULAR | Status: AC | PRN
  Administered 2023-09-01: 30 mL via ORAL

## 2023-09-03 DIAGNOSIS — C20 Malignant neoplasm of rectum: Secondary | ICD-10-CM | POA: Diagnosis not present

## 2023-09-08 ENCOUNTER — Inpatient Hospital Stay: Payer: No Typology Code available for payment source

## 2023-09-08 ENCOUNTER — Inpatient Hospital Stay: Payer: No Typology Code available for payment source | Attending: Hematology | Admitting: Hematology

## 2023-09-08 VITALS — BP 138/80 | HR 75 | Temp 97.2°F | Resp 20

## 2023-09-08 VITALS — BP 133/70 | HR 64 | Temp 97.3°F | Resp 18 | Wt 174.7 lb

## 2023-09-08 DIAGNOSIS — D696 Thrombocytopenia, unspecified: Secondary | ICD-10-CM | POA: Insufficient documentation

## 2023-09-08 DIAGNOSIS — Z79899 Other long term (current) drug therapy: Secondary | ICD-10-CM | POA: Diagnosis not present

## 2023-09-08 DIAGNOSIS — E876 Hypokalemia: Secondary | ICD-10-CM | POA: Insufficient documentation

## 2023-09-08 DIAGNOSIS — R161 Splenomegaly, not elsewhere classified: Secondary | ICD-10-CM | POA: Diagnosis not present

## 2023-09-08 DIAGNOSIS — Z801 Family history of malignant neoplasm of trachea, bronchus and lung: Secondary | ICD-10-CM | POA: Insufficient documentation

## 2023-09-08 DIAGNOSIS — Z87891 Personal history of nicotine dependence: Secondary | ICD-10-CM | POA: Insufficient documentation

## 2023-09-08 DIAGNOSIS — Z5189 Encounter for other specified aftercare: Secondary | ICD-10-CM | POA: Insufficient documentation

## 2023-09-08 DIAGNOSIS — I700 Atherosclerosis of aorta: Secondary | ICD-10-CM | POA: Diagnosis not present

## 2023-09-08 DIAGNOSIS — C787 Secondary malignant neoplasm of liver and intrahepatic bile duct: Secondary | ICD-10-CM | POA: Insufficient documentation

## 2023-09-08 DIAGNOSIS — Z5111 Encounter for antineoplastic chemotherapy: Secondary | ICD-10-CM | POA: Diagnosis not present

## 2023-09-08 DIAGNOSIS — R918 Other nonspecific abnormal finding of lung field: Secondary | ICD-10-CM | POA: Insufficient documentation

## 2023-09-08 DIAGNOSIS — D509 Iron deficiency anemia, unspecified: Secondary | ICD-10-CM | POA: Insufficient documentation

## 2023-09-08 DIAGNOSIS — C189 Malignant neoplasm of colon, unspecified: Secondary | ICD-10-CM | POA: Diagnosis not present

## 2023-09-08 DIAGNOSIS — I10 Essential (primary) hypertension: Secondary | ICD-10-CM | POA: Insufficient documentation

## 2023-09-08 DIAGNOSIS — C187 Malignant neoplasm of sigmoid colon: Secondary | ICD-10-CM | POA: Insufficient documentation

## 2023-09-08 DIAGNOSIS — C20 Malignant neoplasm of rectum: Secondary | ICD-10-CM | POA: Diagnosis not present

## 2023-09-08 DIAGNOSIS — E1142 Type 2 diabetes mellitus with diabetic polyneuropathy: Secondary | ICD-10-CM | POA: Insufficient documentation

## 2023-09-08 LAB — URINALYSIS, DIPSTICK ONLY
Bilirubin Urine: NEGATIVE
Glucose, UA: NEGATIVE mg/dL
Hgb urine dipstick: NEGATIVE
Ketones, ur: NEGATIVE mg/dL
Leukocytes,Ua: NEGATIVE
Nitrite: NEGATIVE
Protein, ur: NEGATIVE mg/dL
Specific Gravity, Urine: 1.011 (ref 1.005–1.030)
pH: 6 (ref 5.0–8.0)

## 2023-09-08 LAB — CBC WITH DIFFERENTIAL/PLATELET
Abs Immature Granulocytes: 0.22 10*3/uL — ABNORMAL HIGH (ref 0.00–0.07)
Basophils Absolute: 0.1 10*3/uL (ref 0.0–0.1)
Basophils Relative: 1 %
Eosinophils Absolute: 0.2 10*3/uL (ref 0.0–0.5)
Eosinophils Relative: 2 %
HCT: 33.9 % — ABNORMAL LOW (ref 36.0–46.0)
Hemoglobin: 10.7 g/dL — ABNORMAL LOW (ref 12.0–15.0)
Immature Granulocytes: 2 %
Lymphocytes Relative: 18 %
Lymphs Abs: 1.8 10*3/uL (ref 0.7–4.0)
MCH: 31.6 pg (ref 26.0–34.0)
MCHC: 31.6 g/dL (ref 30.0–36.0)
MCV: 100 fL (ref 80.0–100.0)
Monocytes Absolute: 1 10*3/uL (ref 0.1–1.0)
Monocytes Relative: 10 %
Neutro Abs: 7 10*3/uL (ref 1.7–7.7)
Neutrophils Relative %: 67 %
Platelets: 130 10*3/uL — ABNORMAL LOW (ref 150–400)
RBC: 3.39 MIL/uL — ABNORMAL LOW (ref 3.87–5.11)
RDW: 20 % — ABNORMAL HIGH (ref 11.5–15.5)
WBC: 10.2 10*3/uL (ref 4.0–10.5)
nRBC: 0 % (ref 0.0–0.2)

## 2023-09-08 LAB — COMPREHENSIVE METABOLIC PANEL
ALT: 28 U/L (ref 0–44)
AST: 36 U/L (ref 15–41)
Albumin: 3.5 g/dL (ref 3.5–5.0)
Alkaline Phosphatase: 175 U/L — ABNORMAL HIGH (ref 38–126)
Anion gap: 10 (ref 5–15)
BUN: 10 mg/dL (ref 8–23)
CO2: 23 mmol/L (ref 22–32)
Calcium: 9 mg/dL (ref 8.9–10.3)
Chloride: 103 mmol/L (ref 98–111)
Creatinine, Ser: 1 mg/dL (ref 0.44–1.00)
GFR, Estimated: >60 mL/min (ref >60)
Glucose, Bld: 119 mg/dL — ABNORMAL HIGH (ref 70–99)
Potassium: 3.7 mmol/L (ref 3.5–5.1)
Sodium: 136 mmol/L (ref 135–145)
Total Bilirubin: 0.7 mg/dL (ref 0.0–1.2)
Total Protein: 7.3 g/dL (ref 6.5–8.1)

## 2023-09-08 LAB — MAGNESIUM: Magnesium: 2.3 mg/dL (ref 1.7–2.4)

## 2023-09-08 MED ORDER — SODIUM CHLORIDE 0.9% FLUSH
10.0000 mL | Freq: Once | INTRAVENOUS | Status: AC
  Administered 2023-09-08: 10 mL via INTRAVENOUS

## 2023-09-08 MED ORDER — SODIUM CHLORIDE 0.9 % IV SOLN: INTRAVENOUS | Status: DC

## 2023-09-08 MED ORDER — ATROPINE SULFATE 1 MG/ML IV SOLN
0.5000 mg | Freq: Once | INTRAVENOUS | Status: AC
  Administered 2023-09-08: 0.5 mg via INTRAVENOUS
  Filled 2023-09-08: qty 1

## 2023-09-08 MED ORDER — PALONOSETRON HCL INJECTION 0.25 MG/5ML
0.2500 mg | Freq: Once | INTRAVENOUS | Status: AC
  Administered 2023-09-08: 0.25 mg via INTRAVENOUS
  Filled 2023-09-08: qty 5

## 2023-09-08 MED ORDER — SODIUM CHLORIDE 0.9 % IV SOLN
5.0000 mg/kg | Freq: Once | INTRAVENOUS | Status: AC
  Administered 2023-09-08: 400 mg via INTRAVENOUS
  Filled 2023-09-08: qty 16

## 2023-09-08 MED ORDER — SODIUM CHLORIDE 0.9 % IV SOLN
1920.0000 mg/m2 | INTRAVENOUS | Status: DC
  Administered 2023-09-08: 3500 mg via INTRAVENOUS
  Filled 2023-09-08: qty 70

## 2023-09-08 MED ORDER — DEXAMETHASONE SODIUM PHOSPHATE 10 MG/ML IJ SOLN
10.0000 mg | Freq: Once | INTRAMUSCULAR | Status: AC
  Administered 2023-09-08: 10 mg via INTRAVENOUS
  Filled 2023-09-08: qty 1

## 2023-09-08 MED ORDER — SODIUM CHLORIDE 0.9% FLUSH: 10.0000 mL | INTRAVENOUS | Status: DC | PRN

## 2023-09-08 MED ORDER — FLUOROURACIL CHEMO INJECTION 2.5 GM/50ML
320.0000 mg/m2 | Freq: Once | INTRAVENOUS | Status: AC
  Administered 2023-09-08: 600 mg via INTRAVENOUS
  Filled 2023-09-08: qty 12

## 2023-09-08 MED ORDER — SODIUM CHLORIDE 0.9 % IV SOLN
320.0000 mg/m2 | Freq: Once | INTRAVENOUS | Status: AC
  Administered 2023-09-08: 608 mg via INTRAVENOUS
  Filled 2023-09-08: qty 30.4

## 2023-09-08 MED ORDER — SODIUM CHLORIDE 0.9 % IV SOLN
144.0000 mg/m2 | Freq: Once | INTRAVENOUS | Status: AC
  Administered 2023-09-08: 300 mg via INTRAVENOUS
  Filled 2023-09-08: qty 15

## 2023-09-08 NOTE — Progress Notes (Signed)
 [facility] [address], [place] [address]    Clinic Day:  09/08/2023  Referring physician: [name], MD  Patient Care Team: [name], MD as PCP - General (Hematology) Therese Sarah, RN as Oncology Nurse Navigator (Oncology)   ASSESSMENT & PLAN:   Assessment: 1.  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 -CT chest on 05/02/2020 with no evidence of pulmonary metastatic disease. -CTAP on 05/07/2020 with multiple lesions throughout the liver, largest in the right lobe measuring 3.9 cm, lateral segment left lobe measuring 2.6 cm and inferior right lobe confluent lesion 4.2 x 3.7 cm.  No lymphadenopathy. -CEA on 05/02/2020-60.2. -FOLFOX and bevacizumab started on 06/04/2020. 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 - FOLFOX with bevacizumab from 03/10/2023 through 05/24/2023 with progression. - FOLFIRI and bevacizumab started on 06/09/2023   2.  Iron deficiency anemia: -Feraheme on 04/30/2020 and 05/06/2020.   3.  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 -Smoked for 3 to 4 years and quit. -Mother had cancer, type unknown.  Maternal uncle had lung cancer.  Maternal aunt had lung cancer.    Plan: 1.  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 - She had diarrhea during the first week after last cycle. - CT CAP (09/01/2023): Some lung nodules have slightly decreased in size and other lung nodules and bilobar liver metastasis are stable.  Similar stranding and nodularity throughout the peritoneum and omentum without discrete measurable  nodule. - Labs today: LFTs are normal.  CBC grossly normal with mild thrombocytopenia.  CEA is 24.3 and downtrending slowly.  UA was negative for protein. - Recommend continuing FOLFIRI and bevacizumab at this time.  If there is any difficulty with tolerability, we will consider maintenance 5-FU and bevacizumab.  RTC 4 weeks for follow-up.   2.  Hypertension: - Continue Norvasc 10 mg daily and triamterene/HCTZ daily.  Blood pressure today is 133/70.   3.  Severe hypokalemia: - Continue potassium 40 mill equivalents daily.  Potassium is normal.   4.  Peripheral neuropathy: - Tingling in the fingertips is stable.  Continue gabapentin 100 mg 3 times daily.   5.  Anxiety: - Continue Klonopin 1 mg 3 times daily and Paxil 40 mg daily.  Symptoms well-controlled.  6.  Newly diagnosed diabetes: - Continue glimepiride 1 mg tablet with breakfast.  Monitor blood sugars at home.    Orders Placed This Encounter  Procedures   CEA    Standing Status:   Future    Expected Date:   09/22/2023    Expiration Date:   09/21/2024   Magnesium    Standing Status:   Future    Expected Date:   09/22/2023    Expiration Date:   09/21/2024   CBC with Differential    Standing Status:   Future    Expected Date:   09/22/2023    Expiration Date:   09/21/2024   Comprehensive metabolic panel    Standing Status:   Future    Expected Date:   09/22/2023    Expiration Date:   09/21/2024   Urinalysis, dipstick only    Standing Status:   Future    Expected Date:  09/22/2023    Expiration Date:   09/21/2024   CEA    Standing Status:   Future    Expected Date:   10/06/2023    Expiration Date:   10/05/2024   Magnesium    Standing Status:   Future    Expected Date:   10/06/2023    Expiration Date:   10/05/2024   CBC with Differential    Standing Status:   Future    Expected Date:   10/06/2023    Expiration Date:   10/05/2024   Comprehensive metabolic panel    Standing Status:   Future    Expected Date:   10/06/2023    Expiration  Date:   10/05/2024   Urinalysis, dipstick only    Standing Status:   Future    Expected Date:   10/06/2023    Expiration Date:   10/05/2024   CEA    Standing Status:   Future    Expected Date:   10/20/2023    Expiration Date:   10/19/2024   Magnesium    Standing Status:   Future    Expected Date:   10/20/2023    Expiration Date:   10/19/2024   CBC with Differential    Standing Status:   Future    Expected Date:   10/20/2023    Expiration Date:   10/19/2024   Comprehensive metabolic panel    Standing Status:   Future    Expected Date:   10/20/2023    Expiration Date:   10/19/2024   Urinalysis, dipstick only    Standing Status:   Future    Expected Date:   10/20/2023    Expiration Date:   10/19/2024   CEA    Standing Status:   Future    Expected Date:   11/03/2023    Expiration Date:   11/02/2024   Magnesium    Standing Status:   Future    Expected Date:   11/03/2023    Expiration Date:   11/02/2024   CBC with Differential    Standing Status:   Future    Expected Date:   11/03/2023    Expiration Date:   11/02/2024   Comprehensive metabolic panel    Standing Status:   Future    Expected Date:   11/03/2023    Expiration Date:   11/02/2024   Urinalysis, dipstick only    Standing Status:   Future    Expected Date:   11/03/2023    Expiration Date:   11/02/2024      [name], MD   3/12/20252:04 PM  CHIEF COMPLAINT:   Diagnosis: metastatic colon cancer to liver    Cancer Staging  No matching staging information was found for the patient.    Prior Therapy: 1. Sigmoid colectomy and end colostomy on 04/29/2020  2. FOLFOX with bevacizumab, 06/04/20 - 11/06/20 3. Maintenance 5-FU and bevacizumab, 11/20/20 - 02/17/23  Current Therapy:  FOLFOX with bevacizumab    HISTORY OF PRESENT ILLNESS:   Oncology History  Metastatic colon cancer to liver (HCC)  05/16/2020 Initial Diagnosis   Metastatic colon cancer to liver (HCC)   06/03/2020 Genetic Testing   Foundation One:     06/04/2020 -  02/25/2022 Chemotherapy   Patient is on Treatment Plan : COLORECTAL FOLFOX + Bevacizumab q14d     06/04/2020 - 05/26/2023 Chemotherapy   Patient is on Treatment Plan : COLORECTAL FOLFOX + Bevacizumab q14d     06/09/2023 -  Chemotherapy   Patient  is on Treatment Plan : COLORECTAL FOLFIRI + Bevacizumab q14d        INTERVAL HISTORY:   Susan Martin is a 63 y.o. female seen for follow-up of metastatic colon cancer to the liver.  She was last seen by me few weeks ago.  Reported appetite of 100% and energy levels of 50%.  PAST MEDICAL HISTORY:   Past Medical History: Past Medical History:  Diagnosis Date   Adenocarcinoma of colon metastatic to liver [facility]) onocology--- dr s. Ellin Saba   04-29-2020 [facility] surgery for perforated colon s/p sigmoid colectomy w/ colostomy; dx  Stage IV colon cancer mets to liver   Anxiety    Depression    Hypertension    followed by dr Emelda Fear and oncology  (05-27-2020 per pt does not have pcp yet)   IDA (iron deficiency anemia)    Pulmonary nodule, right     Surgical History: Past Surgical History:  Procedure Laterality Date   ABDOMINAL HYSTERECTOMY  03-31-2016   @[facility]    W/  BILATERAL SALPINOOPHORECTOMY    APPLICATION OF WOUND VAC N/A 04/29/2020   Procedure: APPLICATION OF WOUND VAC;  Surgeon: Rodman Pickle, MD;  Location: [facility] OR;  Service: General;  Laterality: N/A;   ENDOMETRIAL ABLATION     LAPAROTOMY N/A 04/29/2020   Procedure: EXPLORATORY LAPAROTOMY;  Surgeon: Rodman Pickle, MD;  Location: [facility] OR;  Service: General;  Laterality: N/A;   PARTIAL COLECTOMY N/A 04/29/2020   Procedure: PARTIAL COLECTOMY WITH END COLOSTOMY;  Surgeon: Sheliah Hatch De Blanch, MD;  Location: [facility] OR;  Service: General;  Laterality: N/A;   PORTACATH PLACEMENT Right 05/28/2020   Procedure: INSERTION PORT-A-CATH;  Surgeon: Kinsinger, De Blanch, MD;  Location: [facility];  Service: General;  Laterality: Right;   SALPINGOOPHORECTOMY Left 03/31/2016    Procedure: LEFT SALPINGO OOPHORECTOMY WITH FROZEN SECTION,  ABDOMINAL HYSTERECTOMY WITH BILATERAL SALPINGO-OOPHORECTOMY;  Surgeon: Tilda Burrow, MD;  Location: [facility];  Service: Gynecology;  Laterality: Left;    Social History: Social History   Socioeconomic History   Marital status: Divorced    Spouse name: Not on file   Number of children: Not on file   Years of education: Not on file   Highest education level: Not on file  Occupational History   Not on file  Tobacco Use   Smoking status: Former    Current packs/day: 0.00    Average packs/day: 0.5 packs/day for 5.0 years (2.5 ttl pk-yrs)    Types: Cigarettes    Start date: 03/27/2010    Quit date: 03/28/2015    Years since quitting: 8.4   Smokeless tobacco: Never  Vaping Use   Vaping status: Never Used  Substance and Sexual Activity   Alcohol use: No   Drug use: Never   Sexual activity: Yes    Partners: Male    Birth control/protection: Surgical  Other Topics Concern   Not on file  Social History Narrative   Not on file   Social Drivers of Health   Financial Resource Strain: Low Risk  (11/01/2019)   Overall Financial Resource Strain (CARDIA)    Difficulty of Paying Living Expenses: Not very hard  Food Insecurity: No Food Insecurity (11/01/2019)   Hunger Vital Sign    Worried About Running Out of Food in the Last Year: Never true    Ran Out of Food in the Last Year: Never true  Transportation Needs: No Transportation Needs (11/01/2019)   PRAPARE - Administrator, Civil Service (Medical): No  Lack of Transportation (Non-Medical): No  Physical Activity: Insufficiently Active (11/01/2019)   Exercise Vital Sign    Days of Exercise per Week: 2 days    Minutes of Exercise per Session: 30 min  Stress: Stress Concern Present (11/01/2019)   [employer] of Occupational Health - Occupational Stress Questionnaire    Feeling of Stress : To some extent  Social Connections: Moderately Integrated (11/01/2019)    Social Connection and Isolation Panel [NHANES]    Frequency of Communication with Friends and Family: More than three times a week    Frequency of Social Gatherings with Friends and Family: Once a week    Attends Religious Services: More than 4 times per year    Active Member of [employer] or Organizations: No    Attends Banker Meetings: Never    Marital Status: Living with partner  Intimate Partner Violence: Not At Risk (11/01/2019)   Humiliation, Afraid, Rape, and Kick questionnaire    Fear of Current or Ex-Partner: No    Emotionally Abused: No    Physically Abused: No    Sexually Abused: No    Family History: Family History  Problem Relation Age of Onset   Hypertension Mother    Diabetes Mother    Cirrhosis Father     Current Medications:  Current Outpatient Medications:    amLODipine (NORVASC) 10 MG tablet, TAKE 1 TABLET BY MOUTH EVERY DAY, Disp: 90 tablet, Rfl: 2   amLODipine (NORVASC) 5 MG tablet, TAKE 1 TABLET (5 MG TOTAL) BY MOUTH DAILY., Disp: 90 tablet, Rfl: 1   BEVACIZUMAB IV, Inject 5 mg/kg into the vein every 14 (fourteen) days., Disp: , Rfl:    blood glucose meter kit and supplies KIT, Dispense based on patient and insurance preference. Use up to four times daily as directed., Disp: 1 each, Rfl: 6   clonazePAM (KLONOPIN) 1 MG tablet, Take 1 tablet (1 mg total) by mouth in the morning, at noon, and at bedtime., Disp: 90 tablet, Rfl: 3   fluorouracil CALGB 91478 in sodium chloride 0.9 % 150 mL, Inject 2,400 mg/m2 into the vein over 48 hr., Disp: , Rfl:    gabapentin (NEURONTIN) 100 MG capsule, TAKE 1 CAPSULE (100 MG TOTAL) BY MOUTH 3 TIMES A DAY, Disp: 90 capsule, Rfl: 3   glimepiride (AMARYL) 1 MG tablet, TAKE 1 TABLET BY MOUTH DAILY WITH BREAKFAST., Disp: 90 tablet, Rfl: 1   Lancets (ONETOUCH DELICA PLUS LANCET33G) MISC, Apply topically., Disp: , Rfl:    LEUCOVORIN CALCIUM IV, Inject 400 mg/m2 into the vein every 21 ( twenty-one) days., Disp: , Rfl:     lidocaine (XYLOCAINE) 2 % solution, Use as directed 15 mLs in the mouth or throat every 6 (six) hours as needed for mouth pain. Swish and spit/swallow every six hours as needed for mouth pain, Disp: 480 mL, Rfl: 3   lidocaine-prilocaine (EMLA) cream, Apply 1 Application topically as needed., Disp: 30 g, Rfl: 0   nystatin (MYCOSTATIN) 100000 UNIT/ML suspension, Take by mouth., Disp: , Rfl:    ONETOUCH ULTRA test strip, Use as instructed, Disp: 100 each, Rfl: 6   OXALIPLATIN IV, Inject into the vein every 14 (fourteen) days., Disp: , Rfl:    oxyCODONE (OXY IR/ROXICODONE) 5 MG immediate release tablet, Take 5 mg by mouth every 6 (six) hours as needed. for pain, Disp: , Rfl:    PARoxetine (PAXIL) 40 MG tablet, Take 1 tablet (40 mg total) by mouth every morning., Disp: 90 tablet, Rfl: 3  polyethylene glycol (MIRALAX / GLYCOLAX) packet, Take 17 g by mouth every other day., Disp: , Rfl:    potassium chloride (KLOR-CON M10) 10 MEQ tablet, Take 2 tablets (20 mEq total) by mouth daily., Disp: 180 tablet, Rfl: 3   triamterene-hydrochlorothiazide (MAXZIDE-25) 37.5-25 MG tablet, TAKE 1 TABLET BY MOUTH EVERY DAY, Disp: 90 tablet, Rfl: 1 No current facility-administered medications for this visit.  Facility-Administered Medications Ordered in Other Visits:    0.9 %  sodium chloride infusion, , Intravenous, Continuous, [name], MD, Last Rate: 10 mL/hr at 09/08/23 1058, New Bag at 09/08/23 1058   fluorouracil (ADRUCIL) 3,500 mg in sodium chloride 0.9 % 80 mL chemo infusion, 1,920 mg/m2 (Order-Specific), Intravenous, 1 day or 1 dose, [name], MD, 3,500 mg at 09/08/23 1401   fluorouracil (ADRUCIL) chemo injection 600 mg, 320 mg/m2 (Order-Specific), Intravenous, Once, [name], MD, 600 mg at 09/08/23 1401   sodium chloride flush (NS) 0.9 % injection 10 mL, 10 mL, Intracatheter, PRN, [name], MD   Allergies: No Known Allergies  REVIEW OF SYSTEMS:   Review of  Systems  Constitutional:  Negative for chills, fatigue and fever.  HENT:   Negative for lump/mass, mouth sores, nosebleeds, sore throat and trouble swallowing.   Eyes:  Negative for eye problems.  Respiratory:  Negative for cough.   Cardiovascular:  Negative for chest pain, leg swelling and palpitations.  Gastrointestinal:  Positive for diarrhea. Negative for abdominal pain, constipation, nausea and vomiting.  Genitourinary:  Negative for bladder incontinence, difficulty urinating, dysuria, frequency, hematuria and nocturia.   Musculoskeletal:  Negative for arthralgias, back pain, flank pain, myalgias and neck pain.  Skin:  Negative for itching and rash.  Neurological:  Positive for numbness. Negative for dizziness and headaches.  Hematological:  Does not bruise/bleed easily.  Psychiatric/Behavioral:  Negative for depression, sleep disturbance and suicidal ideas. The patient is nervous/anxious.   All other systems reviewed and are negative.    VITALS:   There were no vitals taken for this visit.  Wt Readings from Last 3 Encounters:  09/08/23 174 lb 11.2 oz (79.2 kg)  08/25/23 175 lb (79.4 kg)  08/11/23 174 lb (78.9 kg)    There is no height or weight on file to calculate BMI.  Performance status (ECOG): 1 - Symptomatic but completely ambulatory  PHYSICAL EXAM:   Physical Exam Vitals and nursing note reviewed. Exam conducted with a chaperone present.  Constitutional:      Appearance: Normal appearance.  Cardiovascular:     Rate and Rhythm: Normal rate and regular rhythm.     Pulses: Normal pulses.     Heart sounds: Normal heart sounds.  Pulmonary:     Effort: Pulmonary effort is normal.     Breath sounds: Normal breath sounds.  Abdominal:     Palpations: Abdomen is soft. There is no hepatomegaly, splenomegaly or mass.     Tenderness: There is no abdominal tenderness.  Musculoskeletal:     Right lower leg: No edema.     Left lower leg: No edema.  Lymphadenopathy:      Cervical: No cervical adenopathy.     Right cervical: No superficial, deep or posterior cervical adenopathy.    Left cervical: No superficial, deep or posterior cervical adenopathy.     Upper Body:     Right upper body: No supraclavicular or axillary adenopathy.     Left upper body: No supraclavicular or axillary adenopathy.  Neurological:     General: No focal deficit present.  Mental Status: She is alert and oriented to person, place, and time.  Psychiatric:        Mood and Affect: Mood normal.        Behavior: Behavior normal.     LABS:   CBC     Component Value Date/Time   WBC 10.2 09/08/2023 0915   RBC 3.39 (L) 09/08/2023 0915   HGB 10.7 (L) 09/08/2023 0915   HGB 14.9 03/05/2016 1201   HCT 33.9 (L) 09/08/2023 0915   HCT 43.4 03/05/2016 1201   PLT 130 (L) 09/08/2023 0915   PLT 340 03/05/2016 1201   MCV 100.0 09/08/2023 0915   MCV 90 03/05/2016 1201   MCH 31.6 09/08/2023 0915   MCHC 31.6 09/08/2023 0915   RDW 20.0 (H) 09/08/2023 0915   RDW 12.8 03/05/2016 1201   LYMPHSABS 1.8 09/08/2023 0915   LYMPHSABS 1.0 03/05/2016 1201   MONOABS 1.0 09/08/2023 0915   EOSABS 0.2 09/08/2023 0915   EOSABS 0.0 03/05/2016 1201   BASOSABS 0.1 09/08/2023 0915   BASOSABS 0.0 03/05/2016 1201    CMP      Component Value Date/Time   NA 136 09/08/2023 0915   NA 140 03/05/2016 1201   K 3.7 09/08/2023 0915   CL 103 09/08/2023 0915   CO2 23 09/08/2023 0915   GLUCOSE 119 (H) 09/08/2023 0915   BUN 10 09/08/2023 0915   BUN 10 03/05/2016 1201   CREATININE 1.00 09/08/2023 0915   CALCIUM 9.0 09/08/2023 0915   PROT 7.3 09/08/2023 0915   PROT 7.4 03/05/2016 1201   ALBUMIN 3.5 09/08/2023 0915   ALBUMIN 4.0 03/05/2016 1201   AST 36 09/08/2023 0915   ALT 28 09/08/2023 0915   ALKPHOS 175 (H) 09/08/2023 0915   BILITOT 0.7 09/08/2023 0915   BILITOT 0.3 03/05/2016 1201   GFRNONAA >60 09/08/2023 0915   GFRAA >60 04/01/2016 0541     Lab Results  Component Value Date   CEA1 24.3 (H)  08/25/2023   /  CEA  Date Value Ref Range Status  08/25/2023 24.3 (H) 0.0 - 4.7 ng/mL Final    Comment:    (NOTE)                             Nonsmokers          <3.9                             Smokers             <5.6 Roche [employer] Electrochemiluminescence Immunoassay (ECLIA) Values obtained with different assay methods or kits cannot be used interchangeably.  Results cannot be interpreted as absolute evidence of the presence or absence of malignant disease. Performed At: [facility] [address] [place], [place] [id number] Jolene Schimke MD WU:[id number]    No results found for: "PSA1" No results found for: "CAN199" No results found for: "CAN125"  No results found for: "TOTALPROTELP", "ALBUMINELP", "A1GS", "A2GS", "BETS", "BETA2SER", "GAMS", "MSPIKE", "SPEI" Lab Results  Component Value Date   TIBC 248 (L) 04/29/2020   FERRITIN 57 04/29/2020   IRONPCTSAT 3 (L) 04/29/2020   No results found for: "LDH"   STUDIES:   CT CHEST ABDOMEN PELVIS W CONTRAST Result Date: 09/01/2023 CLINICAL DATA:  Metastatic colon cancer, restaging. * Tracking Code: BO * EXAM: CT CHEST, ABDOMEN, AND PELVIS WITH CONTRAST TECHNIQUE: Multidetector CT imaging of the chest, abdomen  and pelvis was performed following the standard protocol during bolus administration of intravenous contrast. RADIATION DOSE REDUCTION: This exam was performed according to the departmental dose-optimization program which includes automated exposure control, adjustment of the mA and/or kV according to patient size and/or use of iterative reconstruction technique. CONTRAST:  OMNIPAQUE IOHEXOL 300 MG/ML SOLN, 30mL OMNIPAQUE IOHEXOL 300 MG/ML SOLN COMPARISON:  Multiple priors including most recent CT June 02, 2023 FINDINGS: CT CHEST FINDINGS Cardiovascular: Right chest Port-A-Cath with tip near the superior cavoatrial junction. Normal caliber thoracic aorta. Normal size heart. No significant pericardial  effusion/thickening. Mediastinum/Nodes: No suspicious thyroid nodule. No pathologically enlarged mediastinal, hilar or axillary lymph nodes. The esophagus is grossly unremarkable. Lungs/Pleura: Innumerable irregular solid pulmonary nodules some of which are slightly decreased in size others of which are stable in size. For reference: -nodule in the right lung apex measures 8 x 6 mm on image 29/3, unchanged. -superior segment right lower lobe pulmonary nodule measures 9 mm on image 55/3 previously 10 mm. -right lower lobe pulmonary nodule measures 8 mm in short axis on image 89/3 previously 11 mm. No definite new pulmonary nodules or masses. Musculoskeletal: No aggressive lytic or blastic lesion of bone. CT ABDOMEN PELVIS FINDINGS Hepatobiliary: Bilobar hepatic metastasis are similar prior examination. For reference: -mass in the central right lobe of the liver measures 2.4 x 2.0 cm on image 48/4, unchanged -posterior right hepatic lobe lesion measures 19 mm on image 59/2 previously 18 mm Nodular hepatic contour. Gallbladder is unremarkable. No biliary ductal dilation. Pancreas: No pancreatic ductal dilation or evidence of acute inflammation. Spleen: Mild splenomegaly, unchanged. Adrenals/Urinary Tract: Bilateral adrenal glands appear normal. No hydronephrosis. Kidneys demonstrate symmetric enhancement. Urinary bladder is unremarkable for degree of distension. Stomach/Bowel: Stomach is unremarkable for degree of distension. No pathologic dilation of small or large bowel. Prior sigmoid resection with Hartmann's pouch formation and left lower quadrant end colostomy. No new suspicious nodularity along the rectal stump line or the colostomy site. Vascular/Lymphatic: Aortic atherosclerosis. No pathologically enlarged abdominal or pelvic lymph nodes. Reproductive: Hysterectomy. Other: Similar stranding and nodularity throughout the peritoneum and omentum without discrete measurable nodule. Trace perihepatic free fluid fat  and fluid containing peristomal hernia in the left anterior abdominal wall. Musculoskeletal: No aggressive lytic or blastic lesion of bone. IMPRESSION: 1. Innumerable bilateral pulmonary nodules some of which are slightly decreased in size others are stable in size. 2. Stable bilobar hepatic metastasis. 3. Similar stranding and nodularity throughout the peritoneum and omentum without discrete measurable nodule. 4.  Aortic Atherosclerosis (ICD10-I70.0). Electronically Signed   By: Maudry Mayhew M.D.   On: 09/01/2023 16:39

## 2023-09-08 NOTE — Patient Instructions (Signed)
 [facility] [place] - [facility]  Discharge Instructions: Thank you for choosing [facility] to provide your oncology and hematology care.  If you have a lab appointment with the Cancer Center - please note that after April 8th, 2024, all labs will be drawn in the cancer center.  You do not have to check in or register with the main entrance as you have in the past but will complete your check-in in the cancer center.  Wear comfortable clothing and clothing appropriate for easy access to any Portacath or PICC line.   We strive to give you quality time with your provider. You may need to reschedule your appointment if you arrive late (15 or more minutes).  Arriving late affects you and other patients whose appointments are after yours.  Also, if you miss three or more appointments without notifying the office, you may be dismissed from the clinic at the provider's discretion.      For prescription refill requests, have your pharmacy contact our office and allow 72 hours for refills to be completed.    Today you received the following chemotherapy and/or immunotherapy agents mvsi, irinotecan, adr;uicil      To help prevent nausea and vomiting after your treatment, we encourage you to take your nausea medication as directed.  BELOW ARE SYMPTOMS THAT SHOULD BE REPORTED IMMEDIATELY: *FEVER GREATER THAN 100.4 F (38 C) OR HIGHER *CHILLS OR SWEATING *NAUSEA AND VOMITING THAT IS NOT CONTROLLED WITH YOUR NAUSEA MEDICATION *UNUSUAL SHORTNESS OF BREATH *UNUSUAL BRUISING OR BLEEDING *URINARY PROBLEMS (pain or burning when urinating, or frequent urination) *BOWEL PROBLEMS (unusual diarrhea, constipation, pain near the anus) TENDERNESS IN MOUTH AND THROAT WITH OR WITHOUT PRESENCE OF ULCERS (sore throat, sores in mouth, or a toothache) UNUSUAL RASH, SWELLING OR PAIN  UNUSUAL VAGINAL DISCHARGE OR ITCHING   Items with * indicate a potential emergency and  should be followed up as soon as possible or go to the Emergency Department if any problems should occur.  Please show the CHEMOTHERAPY ALERT CARD or IMMUNOTHERAPY ALERT CARD at check-in to the Emergency Department and triage nurse.  Should you have questions after your visit or need to cancel or reschedule your appointment, please contact [facility] [place] - A DEPT OF [facility]igh[facility] Bridegroom [facility] [phone]  and follow the prompts.  Office hours are 8:00 a.m. to 4:30 p.m. Monday - Friday. Please note that voicemails left after 4:00 p.m. may not be returned until the following business day.  We are closed weekends and major holidays. You have access to a nurse at all times for urgent questions. Please call the main number to the clinic [phone] and follow the prompts.  For any non-urgent questions, you may also contact your provider using MyChart. We now offer e-Visits for anyone 50 and older to request care online for non-urgent symptoms. For details visit [email or web address].   Also download the MyChart app! Go to the app store, search "MyChart", open the app, select [facility], and log in with your MyChart username and password.

## 2023-09-08 NOTE — Patient Instructions (Signed)
 [facility] Discharge Instructions   You were seen and examined today by Dr. Ellin Saba.  He reviewed the results of your lab work which are normal/stable.   He reviewed the results of your CT scan. The cancer has not grown or spread.   We will proceed with your treatment today.   Return as scheduled.    Thank you for choosing [facility] to provide your oncology and hematology care.  To afford each patient quality time with our provider, please arrive at least 15 minutes before your scheduled appointment time.   If you have a lab appointment with the Cancer Center please come in thru the Main Entrance and check in at the main information desk.  You need to re-schedule your appointment should you arrive 10 or more minutes late.  We strive to give you quality time with our providers, and arriving late affects you and other patients whose appointments are after yours.  Also, if you no show three or more times for appointments you may be dismissed from the clinic at the providers discretion.     Again, thank you for choosing [facility].  Our hope is that these requests will decrease the amount of time that you wait before being seen by our physicians.       _____________________________________________________________  Should you have questions after your visit to [facility], please contact our office at [phone] and follow the prompts.  Our office hours are 8:00 a.m. and 4:30 p.m. Monday - Friday.  Please note that voicemails left after 4:00 p.m. may not be returned until the following business day.  We are closed weekends and major holidays.  You do have access to a nurse 24-7, just call the main number to the clinic [phone] and do not press any options, hold on the line and a nurse will answer the phone.    For prescription refill requests, have your pharmacy contact our office  and allow 72 hours.    Due to Covid, you will need to wear a mask upon entering the hospital. If you do not have a mask, a mask will be given to you at the Main Entrance upon arrival. For doctor visits, patients may have 1 support person age 29 or older with them. For treatment visits, patients can not have anyone with them due to social distancing guidelines and our immunocompromised population.

## 2023-09-08 NOTE — Progress Notes (Signed)
 Patient tolerated chemotherapy with no complaints voiced.  Side effects with management reviewed with understanding verbalized.  Port site clean and dry with no bruising or swelling noted at site.  Good blood return noted before and after administration of chemotherapy.  Chemo pump connected with no alarms noted.  Patient left in satisfactory condition with VSS and no s/s of distress noted.

## 2023-09-09 ENCOUNTER — Other Ambulatory Visit: Payer: Self-pay

## 2023-09-09 LAB — CEA: CEA: 14.3 ng/mL — ABNORMAL HIGH (ref 0.0–4.7)

## 2023-09-10 ENCOUNTER — Inpatient Hospital Stay: Payer: No Typology Code available for payment source

## 2023-09-10 VITALS — BP 134/89 | HR 70 | Temp 97.4°F | Resp 18

## 2023-09-10 DIAGNOSIS — Z5111 Encounter for antineoplastic chemotherapy: Secondary | ICD-10-CM | POA: Diagnosis not present

## 2023-09-10 DIAGNOSIS — C189 Malignant neoplasm of colon, unspecified: Secondary | ICD-10-CM

## 2023-09-10 MED ORDER — HEPARIN SOD (PORK) LOCK FLUSH 100 UNIT/ML IV SOLN
500.0000 [IU] | Freq: Once | INTRAVENOUS | Status: AC | PRN
  Administered 2023-09-10: 500 [IU]

## 2023-09-10 MED ORDER — PEGFILGRASTIM-JMDB 6 MG/0.6ML ~~LOC~~ SOSY
6.0000 mg | PREFILLED_SYRINGE | Freq: Once | SUBCUTANEOUS | Status: AC
  Administered 2023-09-10: 6 mg via SUBCUTANEOUS
  Filled 2023-09-10: qty 0.6

## 2023-09-10 MED ORDER — SODIUM CHLORIDE 0.9% FLUSH
10.0000 mL | INTRAVENOUS | Status: DC | PRN
Start: 2023-09-10 — End: 2023-09-10
  Administered 2023-09-10: 10 mL

## 2023-09-10 NOTE — Progress Notes (Signed)
 Patient presents today for pump d/c. Vital signs are stable. Port a cath site clean, dry, and intact. Port flushed with 10 mls of Normal Saline and 500 Units of Heparin. Needle removed intact. Band aid applied. Patient has no complaints at this time.  Patient tolerated Fulphila injection with no complaints voiced.  Site clean and dry with no bruising or swelling noted at site.  See MAR for details.  Band aid applied.  Patient stable during and after injection.  Vss with discharge and left in satisfactory condition with no s/s of distress noted. All follow ups as scheduled.   Cylinda Santoli [employer]

## 2023-09-10 NOTE — Patient Instructions (Signed)

## 2023-09-13 ENCOUNTER — Ambulatory Visit: Payer: No Typology Code available for payment source | Admitting: Internal Medicine

## 2023-09-21 ENCOUNTER — Other Ambulatory Visit: Payer: Self-pay

## 2023-09-22 ENCOUNTER — Inpatient Hospital Stay

## 2023-09-22 VITALS — BP 139/66 | HR 70 | Temp 96.6°F | Resp 20

## 2023-09-22 DIAGNOSIS — C189 Malignant neoplasm of colon, unspecified: Secondary | ICD-10-CM

## 2023-09-22 DIAGNOSIS — Z5111 Encounter for antineoplastic chemotherapy: Secondary | ICD-10-CM | POA: Diagnosis not present

## 2023-09-22 LAB — COMPREHENSIVE METABOLIC PANEL
ALT: 34 U/L (ref 0–44)
AST: 42 U/L — ABNORMAL HIGH (ref 15–41)
Albumin: 3.4 g/dL — ABNORMAL LOW (ref 3.5–5.0)
Alkaline Phosphatase: 189 U/L — ABNORMAL HIGH (ref 38–126)
Anion gap: 11 (ref 5–15)
BUN: 9 mg/dL (ref 8–23)
CO2: 24 mmol/L (ref 22–32)
Calcium: 8.9 mg/dL (ref 8.9–10.3)
Chloride: 102 mmol/L (ref 98–111)
Creatinine, Ser: 0.86 mg/dL (ref 0.44–1.00)
GFR, Estimated: >60 mL/min (ref >60)
Glucose, Bld: 168 mg/dL — ABNORMAL HIGH (ref 70–99)
Potassium: 3.4 mmol/L — ABNORMAL LOW (ref 3.5–5.1)
Sodium: 137 mmol/L (ref 135–145)
Total Bilirubin: 0.7 mg/dL (ref 0.0–1.2)
Total Protein: 7 g/dL (ref 6.5–8.1)

## 2023-09-22 LAB — CBC WITH DIFFERENTIAL/PLATELET
Abs Immature Granulocytes: 0.11 10*3/uL — ABNORMAL HIGH (ref 0.00–0.07)
Basophils Absolute: 0.1 10*3/uL (ref 0.0–0.1)
Basophils Relative: 1 %
Eosinophils Absolute: 0.2 10*3/uL (ref 0.0–0.5)
Eosinophils Relative: 2 %
HCT: 33.5 % — ABNORMAL LOW (ref 36.0–46.0)
Hemoglobin: 10.6 g/dL — ABNORMAL LOW (ref 12.0–15.0)
Immature Granulocytes: 1 %
Lymphocytes Relative: 17 %
Lymphs Abs: 1.3 10*3/uL (ref 0.7–4.0)
MCH: 31.3 pg (ref 26.0–34.0)
MCHC: 31.6 g/dL (ref 30.0–36.0)
MCV: 98.8 fL (ref 80.0–100.0)
Monocytes Absolute: 0.8 10*3/uL (ref 0.1–1.0)
Monocytes Relative: 10 %
Neutro Abs: 5.7 10*3/uL (ref 1.7–7.7)
Neutrophils Relative %: 69 %
Platelets: 98 10*3/uL — ABNORMAL LOW (ref 150–400)
RBC: 3.39 MIL/uL — ABNORMAL LOW (ref 3.87–5.11)
RDW: 19 % — ABNORMAL HIGH (ref 11.5–15.5)
Smear Review: DECREASED
WBC: 8.1 10*3/uL (ref 4.0–10.5)
nRBC: 0 % (ref 0.0–0.2)

## 2023-09-22 LAB — MAGNESIUM: Magnesium: 2.1 mg/dL (ref 1.7–2.4)

## 2023-09-22 MED ORDER — SODIUM CHLORIDE 0.9 % IV SOLN
1920.0000 mg/m2 | INTRAVENOUS | Status: DC
  Administered 2023-09-22: 3500 mg via INTRAVENOUS
  Filled 2023-09-22: qty 70

## 2023-09-22 MED ORDER — SODIUM CHLORIDE 0.9 % IV SOLN
144.0000 mg/m2 | Freq: Once | INTRAVENOUS | Status: AC
  Administered 2023-09-22: 300 mg via INTRAVENOUS
  Filled 2023-09-22: qty 15

## 2023-09-22 MED ORDER — SODIUM CHLORIDE 0.9 % IV SOLN
320.0000 mg/m2 | Freq: Once | INTRAVENOUS | Status: AC
  Administered 2023-09-22: 608 mg via INTRAVENOUS
  Filled 2023-09-22: qty 30.4

## 2023-09-22 MED ORDER — DEXAMETHASONE SODIUM PHOSPHATE 10 MG/ML IJ SOLN
10.0000 mg | Freq: Once | INTRAMUSCULAR | Status: AC
  Administered 2023-09-22: 10 mg via INTRAVENOUS
  Filled 2023-09-22: qty 1

## 2023-09-22 MED ORDER — SODIUM CHLORIDE 0.9% FLUSH
10.0000 mL | INTRAVENOUS | Status: DC | PRN
  Administered 2023-09-22: 10 mL via INTRAVENOUS

## 2023-09-22 MED ORDER — PALONOSETRON HCL INJECTION 0.25 MG/5ML
0.2500 mg | Freq: Once | INTRAVENOUS | Status: AC
  Administered 2023-09-22: 0.25 mg via INTRAVENOUS
  Filled 2023-09-22: qty 5

## 2023-09-22 MED ORDER — SODIUM CHLORIDE 0.9 % IV SOLN: INTRAVENOUS | Status: DC

## 2023-09-22 MED ORDER — POTASSIUM CHLORIDE CRYS ER 20 MEQ PO TBCR
40.0000 meq | EXTENDED_RELEASE_TABLET | Freq: Once | ORAL | Status: AC
  Administered 2023-09-22: 40 meq via ORAL
  Filled 2023-09-22: qty 2

## 2023-09-22 MED ORDER — ATROPINE SULFATE 1 MG/ML IV SOLN
0.5000 mg | Freq: Once | INTRAVENOUS | Status: AC
  Administered 2023-09-22: 0.5 mg via INTRAVENOUS
  Filled 2023-09-22: qty 1

## 2023-09-22 MED ORDER — SODIUM CHLORIDE 0.9 % IV SOLN
5.0000 mg/kg | Freq: Once | INTRAVENOUS | Status: AC
  Administered 2023-09-22: 400 mg via INTRAVENOUS
  Filled 2023-09-22: qty 16

## 2023-09-22 MED ORDER — FLUOROURACIL CHEMO INJECTION 2.5 GM/50ML
320.0000 mg/m2 | Freq: Once | INTRAVENOUS | Status: AC
  Administered 2023-09-22: 600 mg via INTRAVENOUS
  Filled 2023-09-22: qty 12

## 2023-09-22 NOTE — Progress Notes (Signed)
 Labs reviewed with MD today, platelets are 98,000. Ok to treat per MD.     Treatment given per orders. Patient tolerated it well without problems. Vitals stable and discharged home from clinic ambulatory. Follow up as scheduled.

## 2023-09-22 NOTE — Patient Instructions (Signed)
 [facility] [place] - [facility]  Discharge Instructions: Thank you for choosing [facility] to provide your oncology and hematology care.  If you have a lab appointment with the Cancer Center - please note that after April 8th, 2024, all labs will be drawn in the cancer center.  You do not have to check in or register with the main entrance as you have in the past but will complete your check-in in the cancer center.  Wear comfortable clothing and clothing appropriate for easy access to any Portacath or PICC line.   We strive to give you quality time with your provider. You may need to reschedule your appointment if you arrive late (15 or more minutes).  Arriving late affects you and other patients whose appointments are after yours.  Also, if you miss three or more appointments without notifying the office, you may be dismissed from the clinic at the provider's discretion.      For prescription refill requests, have your pharmacy contact our office and allow 72 hours for refills to be completed.    Today you received the following chemotherapy and/or immunotherapy agents: leucovorin, irinotecan, adrucil.    To help prevent nausea and vomiting after your treatment, we encourage you to take your nausea medication as directed.  BELOW ARE SYMPTOMS THAT SHOULD BE REPORTED IMMEDIATELY: *FEVER GREATER THAN 100.4 F (38 C) OR HIGHER *CHILLS OR SWEATING *NAUSEA AND VOMITING THAT IS NOT CONTROLLED WITH YOUR NAUSEA MEDICATION *UNUSUAL SHORTNESS OF BREATH *UNUSUAL BRUISING OR BLEEDING *URINARY PROBLEMS (pain or burning when urinating, or frequent urination) *BOWEL PROBLEMS (unusual diarrhea, constipation, pain near the anus) TENDERNESS IN MOUTH AND THROAT WITH OR WITHOUT PRESENCE OF ULCERS (sore throat, sores in mouth, or a toothache) UNUSUAL RASH, SWELLING OR PAIN  UNUSUAL VAGINAL DISCHARGE OR ITCHING   Items with * indicate a potential emergency  and should be followed up as soon as possible or go to the Emergency Department if any problems should occur.  Please show the CHEMOTHERAPY ALERT CARD or IMMUNOTHERAPY ALERT CARD at check-in to the Emergency Department and triage nurse.  Should you have questions after your visit or need to cancel or reschedule your appointment, please contact [facility] [place] - A DEPT OF [facility]ide[facility] [phone]  and follow the prompts.  Office hours are 8:00 a.m. to 4:30 p.m. Monday - Friday. Please note that voicemails left after 4:00 p.m. may not be returned until the following business day.  We are closed weekends and major holidays. You have access to a nurse at all times for urgent questions. Please call the main number to the clinic [phone] and follow the prompts.  For any non-urgent questions, you may also contact your provider using MyChart. We now offer e-Visits for anyone 93 and older to request care online for non-urgent symptoms. For details visit [email or web address].   Also download the MyChart app! Go to the app store, search "MyChart", open the app, select [place], and log in with your MyChart username and password.

## 2023-09-23 LAB — CEA: CEA: 12.5 ng/mL — ABNORMAL HIGH (ref 0.0–4.7)

## 2023-09-24 ENCOUNTER — Inpatient Hospital Stay

## 2023-09-24 VITALS — BP 130/70 | HR 68 | Temp 97.1°F | Resp 18

## 2023-09-24 DIAGNOSIS — Z5111 Encounter for antineoplastic chemotherapy: Secondary | ICD-10-CM | POA: Diagnosis not present

## 2023-09-24 DIAGNOSIS — C189 Malignant neoplasm of colon, unspecified: Secondary | ICD-10-CM

## 2023-09-24 MED ORDER — SODIUM CHLORIDE 0.9% FLUSH
10.0000 mL | INTRAVENOUS | Status: DC | PRN
  Administered 2023-09-24: 10 mL

## 2023-09-24 MED ORDER — HEPARIN SOD (PORK) LOCK FLUSH 100 UNIT/ML IV SOLN
500.0000 [IU] | Freq: Once | INTRAVENOUS | Status: AC | PRN
  Administered 2023-09-24: 500 [IU]

## 2023-09-24 MED ORDER — PEGFILGRASTIM-JMDB 6 MG/0.6ML ~~LOC~~ SOSY
6.0000 mg | PREFILLED_SYRINGE | Freq: Once | SUBCUTANEOUS | Status: AC
  Administered 2023-09-24: 6 mg via SUBCUTANEOUS
  Filled 2023-09-24: qty 0.6

## 2023-09-24 NOTE — Patient Instructions (Signed)

## 2023-09-24 NOTE — Progress Notes (Signed)
 Patient for chemotherapy pump disconnect with no complaints voiced.  Patients port flushed without difficulty.  Good blood return noted with no bruising or swelling noted at site.  Band aid applied.  VSS with discharge and left ambulatory with no s/s of distress noted.

## 2023-10-04 DIAGNOSIS — C20 Malignant neoplasm of rectum: Secondary | ICD-10-CM | POA: Diagnosis not present

## 2023-10-05 NOTE — Progress Notes (Signed)
 [facility] [address] [place] [address]    Clinic Day:  10/06/2023  Referring physician: [name], MD  Patient Care Team: [name], MD as PCP - General (Hematology) Therese Sarah, RN as Oncology Nurse Navigator (Oncology)   ASSESSMENT & PLAN:   Assessment: 1.  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 -CT chest on 05/02/2020 with no evidence of pulmonary metastatic disease. -CTAP on 05/07/2020 with multiple lesions throughout the liver, largest in the right lobe measuring 3.9 cm, lateral segment left lobe measuring 2.6 cm and inferior right lobe confluent lesion 4.2 x 3.7 cm.  No lymphadenopathy. -CEA on 05/02/2020-60.2. -FOLFOX and bevacizumab started on 06/04/2020. 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 - FOLFOX with bevacizumab from 03/10/2023 through 05/24/2023 with progression. - FOLFIRI and bevacizumab started on 06/09/2023   2.  Iron deficiency anemia: -Feraheme on 04/30/2020 and 05/06/2020.   3.  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 -Smoked for 3 to 4 years and quit. -Mother had cancer, type unknown.  Maternal uncle had lung cancer.  Maternal aunt had lung cancer.    Plan: 1.  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  Similar stranding and nodularity throughout the peritoneum and omentum without discrete measurable nodule. - She is tolerating FOLFIRI and bevacizumab reasonably well.  She has diarrhea of lasting about 1 to 2 days after each treatment.  She  had to empty her bag once a day. - Denies any other GI side effects.  Denies any bleeding. - Reviewed labs today: Normal LFTs and creatinine.  CBC was grossly normal with mild thrombocytopenia.  CEA continues to improve with last level being 12.5, down from 14.3.  UA shows protein of 30. - She will continue FOLFIRI (20% dose reduced) and bevacizumab every 2 weeks.  RTC 6 weeks for follow-up.  If there is any problems tolerating it, will switch to maintenance 5-FU and bevacizumab.    2.  Hypertension: - Continue Norvasc 10 mg daily and triamterene/HCTZ daily.  Blood pressure is better controlled at 136/80.   3.  Severe hypokalemia: - Continue K-Dur 40 mill equivalents daily.  Potassium is 3.6.   4.  Peripheral neuropathy: - Continue gabapentin 100 mg 3 times daily.  Numbness in the fingertips is stable.   5.  Anxiety: - Continue Paxil 40 mg daily and Klonopin 1 mg 3 times daily.  Symptoms well-controlled.  6.  Newly diagnosed diabetes: - Continue glimepiride 1 mg tablet with breakfast.  Blood sugar today is 147.  She has an appointment to see her primary care doctor.  She is not checking blood sugars at home.    Orders Placed This Encounter  Procedures   CEA    Standing Status:   Future    Expected Date:   11/17/2023    Expiration Date:   11/16/2024   Magnesium    Standing Status:   Future    Expected Date:   11/17/2023    Expiration Date:   11/16/2024   CBC with Differential    Standing Status:   Future    Expected Date:   11/17/2023  Expiration Date:   11/16/2024   Comprehensive metabolic panel    Standing Status:   Future    Expected Date:   11/17/2023    Expiration Date:   11/16/2024   Urinalysis, dipstick only    Standing Status:   Future    Expected Date:   11/17/2023    Expiration Date:   11/16/2024      I,Katie Daubenspeck,acting as a scribe for [name], MD.,have documented all relevant documentation on the behalf of [name], MD,as directed by   [name], MD while in the presence of [name], MD.   I, [name] MD, have reviewed the above documentation for accuracy and completeness, and I agree with the above.   [name], MD   4/9/20259:39 AM  CHIEF COMPLAINT:   Diagnosis: metastatic colon cancer to liver    Cancer Staging  No matching staging information was found for the patient.    Prior Therapy: 1. Sigmoid colectomy and end colostomy on 04/29/2020  2. FOLFOX with bevacizumab, 06/04/20 - 11/06/20 3. Maintenance 5-FU and bevacizumab, 11/20/20 - 02/17/23 4. FOLFOX with bevacizumab, 03/10/23 - 05/24/23  Current Therapy:  FOLFIRI with bevacizumab    HISTORY OF PRESENT ILLNESS:   Oncology History  Metastatic colon cancer to liver (HCC)  05/16/2020 Initial Diagnosis   Metastatic colon cancer to liver (HCC)   06/03/2020 Genetic Testing   Foundation One:     06/04/2020 - 02/25/2022 Chemotherapy   Patient is on Treatment Plan : COLORECTAL FOLFOX + Bevacizumab q14d     06/04/2020 - 05/26/2023 Chemotherapy   Patient is on Treatment Plan : COLORECTAL FOLFOX + Bevacizumab q14d     06/09/2023 -  Chemotherapy   Patient is on Treatment Plan : COLORECTAL FOLFIRI + Bevacizumab q14d        INTERVAL HISTORY:   Susan Martin is a 63 y.o. female presenting to clinic today for follow up of metastatic colon cancer to liver. She was last seen by me on 09/08/23.  Today, she states that she is doing well overall. Her appetite level is at 75%. Her energy level is at 25%.  PAST MEDICAL HISTORY:   Past Medical History: Past Medical History:  Diagnosis Date   Adenocarcinoma of colon metastatic to liver [facility]) onocology--- dr s. Ellin Saba   04-29-2020 [facility] surgery for perforated colon s/p sigmoid colectomy w/ colostomy; dx  Stage IV colon cancer mets to liver   Anxiety    Depression    Hypertension    followed by dr Emelda Fear and oncology  (05-27-2020 per pt does not have pcp yet)   IDA  (iron deficiency anemia)    Pulmonary nodule, right     Surgical History: Past Surgical History:  Procedure Laterality Date   ABDOMINAL HYSTERECTOMY  03-31-2016   @[facility]    W/  BILATERAL SALPINOOPHORECTOMY    APPLICATION OF WOUND VAC N/A 04/29/2020   Procedure: APPLICATION OF WOUND VAC;  Surgeon: Rodman Pickle, MD;  Location: [facility] OR;  Service: General;  Laterality: N/A;   ENDOMETRIAL ABLATION     LAPAROTOMY N/A 04/29/2020   Procedure: EXPLORATORY LAPAROTOMY;  Surgeon: Rodman Pickle, MD;  Location: [facility] OR;  Service: General;  Laterality: N/A;   PARTIAL COLECTOMY N/A 04/29/2020   Procedure: PARTIAL COLECTOMY WITH END COLOSTOMY;  Surgeon: Sheliah Hatch De Blanch, MD;  Location: [facility] OR;  Service: General;  Laterality: N/A;   PORTACATH PLACEMENT Right 05/28/2020   Procedure: INSERTION PORT-A-CATH;  Surgeon: Rodman Pickle, MD;  Location: [facility];  Service: General;  Laterality: Right;   SALPINGOOPHORECTOMY Left 03/31/2016   Procedure: LEFT SALPINGO OOPHORECTOMY WITH FROZEN SECTION,  ABDOMINAL HYSTERECTOMY WITH BILATERAL SALPINGO-OOPHORECTOMY;  Surgeon: Tilda Burrow, MD;  Location: [facility];  Service: Gynecology;  Laterality: Left;    Social History: Social History   Socioeconomic History   Marital status: Divorced    Spouse name: Not on file   Number of children: Not on file   Years of education: Not on file   Highest education level: Not on file  Occupational History   Not on file  Tobacco Use   Smoking status: Former    Current packs/day: 0.00    Average packs/day: 0.5 packs/day for 5.0 years (2.5 ttl pk-yrs)    Types: Cigarettes    Start date: 03/27/2010    Quit date: 03/28/2015    Years since quitting: 8.5   Smokeless tobacco: Never  Vaping Use   Vaping status: Never Used  Substance and Sexual Activity   Alcohol use: No   Drug use: Never   Sexual activity: Yes    Partners: Male    Birth control/protection: Surgical  Other Topics Concern    Not on file  Social History Narrative   Not on file   Social Drivers of Health   Financial Resource Strain: Low Risk  (11/01/2019)   Overall Financial Resource Strain (CARDIA)    Difficulty of Paying Living Expenses: Not very hard  Food Insecurity: No Food Insecurity (11/01/2019)   Hunger Vital Sign    Worried About Running Out of Food in the Last Year: Never true    Ran Out of Food in the Last Year: Never true  Transportation Needs: No Transportation Needs (11/01/2019)   PRAPARE - Administrator, Civil Service (Medical): No    Lack of Transportation (Non-Medical): No  Physical Activity: Insufficiently Active (11/01/2019)   Exercise Vital Sign    Days of Exercise per Week: 2 days    Minutes of Exercise per Session: 30 min  Stress: Stress Concern Present (11/01/2019)   [employer] of Occupational Health - Occupational Stress Questionnaire    Feeling of Stress : To some extent  Social Connections: Moderately Integrated (11/01/2019)   Social Connection and Isolation Panel [NHANES]    Frequency of Communication with Friends and Family: More than three times a week    Frequency of Social Gatherings with Friends and Family: Once a week    Attends Religious Services: More than 4 times per year    Active Member of [employer] or Organizations: No    Attends Banker Meetings: Never    Marital Status: Living with partner  Intimate Partner Violence: Not At Risk (11/01/2019)   Humiliation, Afraid, Rape, and Kick questionnaire    Fear of Current or Ex-Partner: No    Emotionally Abused: No    Physically Abused: No    Sexually Abused: No    Family History: Family History  Problem Relation Age of Onset   Hypertension Mother    Diabetes Mother    Cirrhosis Father     Current Medications:  Current Outpatient Medications:    amLODipine (NORVASC) 10 MG tablet, TAKE 1 TABLET BY MOUTH EVERY DAY, Disp: 90 tablet, Rfl: 2   amLODipine (NORVASC) 5 MG tablet, TAKE 1 TABLET (5 MG  TOTAL) BY MOUTH DAILY., Disp: 90 tablet, Rfl: 1   BEVACIZUMAB IV, Inject 5 mg/kg into the vein every 14 (fourteen) days., Disp: , Rfl:    blood glucose  meter kit and supplies KIT, Dispense based on patient and insurance preference. Use up to four times daily as directed., Disp: 1 each, Rfl: 6   clonazePAM (KLONOPIN) 1 MG tablet, Take 1 tablet (1 mg total) by mouth in the morning, at noon, and at bedtime., Disp: 90 tablet, Rfl: 3   fluorouracil CALGB 11914 in sodium chloride 0.9 % 150 mL, Inject 2,400 mg/m2 into the vein over 48 hr., Disp: , Rfl:    gabapentin (NEURONTIN) 100 MG capsule, TAKE 1 CAPSULE (100 MG TOTAL) BY MOUTH 3 TIMES A DAY, Disp: 90 capsule, Rfl: 3   glimepiride (AMARYL) 1 MG tablet, TAKE 1 TABLET BY MOUTH DAILY WITH BREAKFAST., Disp: 90 tablet, Rfl: 1   Lancets (ONETOUCH DELICA PLUS LANCET33G) MISC, Apply topically., Disp: , Rfl:    LEUCOVORIN CALCIUM IV, Inject 400 mg/m2 into the vein every 21 ( twenty-one) days., Disp: , Rfl:    lidocaine (XYLOCAINE) 2 % solution, Use as directed 15 mLs in the mouth or throat every 6 (six) hours as needed for mouth pain. Swish and spit/swallow every six hours as needed for mouth pain, Disp: 480 mL, Rfl: 3   lidocaine-prilocaine (EMLA) cream, Apply 1 Application topically as needed., Disp: 30 g, Rfl: 0   nystatin (MYCOSTATIN) 100000 UNIT/ML suspension, Take by mouth., Disp: , Rfl:    ONETOUCH ULTRA test strip, Use as instructed, Disp: 100 each, Rfl: 6   OXALIPLATIN IV, Inject into the vein every 14 (fourteen) days., Disp: , Rfl:    oxyCODONE (OXY IR/ROXICODONE) 5 MG immediate release tablet, Take 5 mg by mouth every 6 (six) hours as needed. for pain, Disp: , Rfl:    PARoxetine (PAXIL) 40 MG tablet, Take 1 tablet (40 mg total) by mouth every morning., Disp: 90 tablet, Rfl: 3   polyethylene glycol (MIRALAX / GLYCOLAX) packet, Take 17 g by mouth every other day., Disp: , Rfl:    potassium chloride (KLOR-CON M10) 10 MEQ tablet, Take 2 tablets (20 mEq  total) by mouth daily., Disp: 180 tablet, Rfl: 3   triamterene-hydrochlorothiazide (MAXZIDE-25) 37.5-25 MG tablet, TAKE 1 TABLET BY MOUTH EVERY DAY, Disp: 90 tablet, Rfl: 1   Allergies: No Known Allergies  REVIEW OF SYSTEMS:   Review of Systems  Constitutional:  Negative for chills, fatigue and fever.  HENT:   Negative for lump/mass, mouth sores, nosebleeds, sore throat and trouble swallowing.   Eyes:  Negative for eye problems.  Respiratory:  Negative for cough and shortness of breath.   Cardiovascular:  Negative for chest pain, leg swelling and palpitations.  Gastrointestinal:  Positive for diarrhea. Negative for abdominal pain, constipation, nausea and vomiting.  Genitourinary:  Negative for bladder incontinence, difficulty urinating, dysuria, frequency, hematuria and nocturia.   Musculoskeletal:  Negative for arthralgias, back pain, flank pain, myalgias and neck pain.  Skin:  Negative for itching and rash.  Neurological:  Positive for numbness. Negative for dizziness and headaches.  Hematological:  Does not bruise/bleed easily.  Psychiatric/Behavioral:  Positive for depression and sleep disturbance. Negative for suicidal ideas. The patient is nervous/anxious.   All other systems reviewed and are negative.    VITALS:   Weight 170 lb 3.1 oz (77.2 kg).  Wt Readings from Last 3 Encounters:  10/06/23 170 lb 3.1 oz (77.2 kg)  09/22/23 173 lb 1 oz (78.5 kg)  09/08/23 174 lb 11.2 oz (79.2 kg)    Body mass index is 30.15 kg/m.  Performance status (ECOG): 1 - Symptomatic but completely ambulatory  PHYSICAL EXAM:  Physical Exam Vitals and nursing note reviewed. Exam conducted with a chaperone present.  Constitutional:      Appearance: Normal appearance.  Cardiovascular:     Rate and Rhythm: Normal rate and regular rhythm.     Pulses: Normal pulses.     Heart sounds: Normal heart sounds.  Pulmonary:     Effort: Pulmonary effort is normal.     Breath sounds: Normal breath  sounds.  Abdominal:     Palpations: Abdomen is soft. There is no hepatomegaly, splenomegaly or mass.     Tenderness: There is no abdominal tenderness.  Musculoskeletal:     Right lower leg: No edema.     Left lower leg: No edema.  Lymphadenopathy:     Cervical: No cervical adenopathy.     Right cervical: No superficial, deep or posterior cervical adenopathy.    Left cervical: No superficial, deep or posterior cervical adenopathy.     Upper Body:     Right upper body: No supraclavicular or axillary adenopathy.     Left upper body: No supraclavicular or axillary adenopathy.  Neurological:     General: No focal deficit present.     Mental Status: She is alert and oriented to person, place, and time.  Psychiatric:        Mood and Affect: Mood normal.        Behavior: Behavior normal.     LABS:   CBC     Component Value Date/Time   WBC 9.3 10/06/2023 0828   RBC 3.77 (L) 10/06/2023 0828   HGB 11.7 (L) 10/06/2023 0828   HGB 14.9 03/05/2016 1201   HCT 36.7 10/06/2023 0828   HCT 43.4 03/05/2016 1201   PLT 126 (L) 10/06/2023 0828   PLT 340 03/05/2016 1201   MCV 97.3 10/06/2023 0828   MCV 90 03/05/2016 1201   MCH 31.0 10/06/2023 0828   MCHC 31.9 10/06/2023 0828   RDW 18.6 (H) 10/06/2023 0828   RDW 12.8 03/05/2016 1201   LYMPHSABS 1.7 10/06/2023 0828   LYMPHSABS 1.0 03/05/2016 1201   MONOABS 0.9 10/06/2023 0828   EOSABS 0.2 10/06/2023 0828   EOSABS 0.0 03/05/2016 1201   BASOSABS 0.1 10/06/2023 0828   BASOSABS 0.0 03/05/2016 1201    CMP      Component Value Date/Time   NA 136 10/06/2023 0828   NA 140 03/05/2016 1201   K 3.6 10/06/2023 0828   CL 100 10/06/2023 0828   CO2 23 10/06/2023 0828   GLUCOSE 147 (H) 10/06/2023 0828   BUN 10 10/06/2023 0828   BUN 10 03/05/2016 1201   CREATININE 0.85 10/06/2023 0828   CALCIUM 9.3 10/06/2023 0828   PROT 7.4 10/06/2023 0828   PROT 7.4 03/05/2016 1201   ALBUMIN 3.6 10/06/2023 0828   ALBUMIN 4.0 03/05/2016 1201   AST 38  10/06/2023 0828   ALT 33 10/06/2023 0828   ALKPHOS 206 (H) 10/06/2023 0828   BILITOT 0.8 10/06/2023 0828   BILITOT 0.3 03/05/2016 1201   GFRNONAA >60 10/06/2023 0828   GFRAA >60 04/01/2016 0541     Lab Results  Component Value Date   CEA1 12.5 (H) 09/22/2023   /  CEA  Date Value Ref Range Status  09/22/2023 12.5 (H) 0.0 - 4.7 ng/mL Final    Comment:    (NOTE)                             Nonsmokers          <  3.9                             Smokers             <5.6 [employer] Electrochemiluminescence Immunoassay (ECLIA) Values obtained with different assay methods or kits cannot be used interchangeably.  Results cannot be interpreted as absolute evidence of the presence or absence of malignant disease. Performed At: [facility] [address] [place], [place] [id number] Jolene Schimke MD WU:[id number]    No results found for: "PSA1" No results found for: "CAN199" No results found for: "CAN125"  No results found for: "TOTALPROTELP", "ALBUMINELP", "A1GS", "A2GS", "BETS", "BETA2SER", "GAMS", "MSPIKE", "SPEI" Lab Results  Component Value Date   TIBC 248 (L) 04/29/2020   FERRITIN 57 04/29/2020   IRONPCTSAT 3 (L) 04/29/2020   No results found for: "LDH"   STUDIES:   No results found.

## 2023-10-06 ENCOUNTER — Inpatient Hospital Stay: Attending: Hematology

## 2023-10-06 ENCOUNTER — Inpatient Hospital Stay (HOSPITAL_BASED_OUTPATIENT_CLINIC_OR_DEPARTMENT_OTHER): Admitting: Hematology

## 2023-10-06 ENCOUNTER — Inpatient Hospital Stay

## 2023-10-06 VITALS — BP 143/73 | HR 78 | Temp 98.1°F | Resp 18

## 2023-10-06 VITALS — Wt 170.2 lb

## 2023-10-06 DIAGNOSIS — C189 Malignant neoplasm of colon, unspecified: Secondary | ICD-10-CM

## 2023-10-06 DIAGNOSIS — Z87891 Personal history of nicotine dependence: Secondary | ICD-10-CM | POA: Insufficient documentation

## 2023-10-06 DIAGNOSIS — D509 Iron deficiency anemia, unspecified: Secondary | ICD-10-CM | POA: Diagnosis not present

## 2023-10-06 DIAGNOSIS — Z801 Family history of malignant neoplasm of trachea, bronchus and lung: Secondary | ICD-10-CM | POA: Diagnosis not present

## 2023-10-06 DIAGNOSIS — E876 Hypokalemia: Secondary | ICD-10-CM | POA: Insufficient documentation

## 2023-10-06 DIAGNOSIS — I10 Essential (primary) hypertension: Secondary | ICD-10-CM | POA: Insufficient documentation

## 2023-10-06 DIAGNOSIS — C187 Malignant neoplasm of sigmoid colon: Secondary | ICD-10-CM | POA: Diagnosis not present

## 2023-10-06 DIAGNOSIS — R197 Diarrhea, unspecified: Secondary | ICD-10-CM | POA: Diagnosis not present

## 2023-10-06 DIAGNOSIS — Z5189 Encounter for other specified aftercare: Secondary | ICD-10-CM | POA: Diagnosis not present

## 2023-10-06 DIAGNOSIS — D696 Thrombocytopenia, unspecified: Secondary | ICD-10-CM | POA: Insufficient documentation

## 2023-10-06 DIAGNOSIS — E1142 Type 2 diabetes mellitus with diabetic polyneuropathy: Secondary | ICD-10-CM | POA: Diagnosis not present

## 2023-10-06 DIAGNOSIS — C787 Secondary malignant neoplasm of liver and intrahepatic bile duct: Secondary | ICD-10-CM | POA: Diagnosis not present

## 2023-10-06 DIAGNOSIS — Z5111 Encounter for antineoplastic chemotherapy: Secondary | ICD-10-CM | POA: Insufficient documentation

## 2023-10-06 DIAGNOSIS — C20 Malignant neoplasm of rectum: Secondary | ICD-10-CM | POA: Diagnosis not present

## 2023-10-06 LAB — MAGNESIUM: Magnesium: 2.1 mg/dL (ref 1.7–2.4)

## 2023-10-06 LAB — COMPREHENSIVE METABOLIC PANEL WITH GFR
ALT: 33 U/L (ref 0–44)
AST: 38 U/L (ref 15–41)
Albumin: 3.6 g/dL (ref 3.5–5.0)
Alkaline Phosphatase: 206 U/L — ABNORMAL HIGH (ref 38–126)
Anion gap: 13 (ref 5–15)
BUN: 10 mg/dL (ref 8–23)
CO2: 23 mmol/L (ref 22–32)
Calcium: 9.3 mg/dL (ref 8.9–10.3)
Chloride: 100 mmol/L (ref 98–111)
Creatinine, Ser: 0.85 mg/dL (ref 0.44–1.00)
GFR, Estimated: >60 mL/min (ref >60)
Glucose, Bld: 147 mg/dL — ABNORMAL HIGH (ref 70–99)
Potassium: 3.6 mmol/L (ref 3.5–5.1)
Sodium: 136 mmol/L (ref 135–145)
Total Bilirubin: 0.8 mg/dL (ref 0.0–1.2)
Total Protein: 7.4 g/dL (ref 6.5–8.1)

## 2023-10-06 LAB — CBC WITH DIFFERENTIAL/PLATELET
Abs Immature Granulocytes: 0.08 10*3/uL — ABNORMAL HIGH (ref 0.00–0.07)
Basophils Absolute: 0.1 10*3/uL (ref 0.0–0.1)
Basophils Relative: 1 %
Eosinophils Absolute: 0.2 10*3/uL (ref 0.0–0.5)
Eosinophils Relative: 2 %
HCT: 36.7 % (ref 36.0–46.0)
Hemoglobin: 11.7 g/dL — ABNORMAL LOW (ref 12.0–15.0)
Immature Granulocytes: 1 %
Lymphocytes Relative: 18 %
Lymphs Abs: 1.7 10*3/uL (ref 0.7–4.0)
MCH: 31 pg (ref 26.0–34.0)
MCHC: 31.9 g/dL (ref 30.0–36.0)
MCV: 97.3 fL (ref 80.0–100.0)
Monocytes Absolute: 0.9 10*3/uL (ref 0.1–1.0)
Monocytes Relative: 10 %
Neutro Abs: 6.3 10*3/uL (ref 1.7–7.7)
Neutrophils Relative %: 68 %
Platelets: 126 10*3/uL — ABNORMAL LOW (ref 150–400)
RBC: 3.77 MIL/uL — ABNORMAL LOW (ref 3.87–5.11)
RDW: 18.6 % — ABNORMAL HIGH (ref 11.5–15.5)
WBC: 9.3 10*3/uL (ref 4.0–10.5)
nRBC: 0 % (ref 0.0–0.2)

## 2023-10-06 LAB — URINALYSIS, DIPSTICK ONLY
Bilirubin Urine: NEGATIVE
Glucose, UA: NEGATIVE mg/dL
Hgb urine dipstick: NEGATIVE
Ketones, ur: NEGATIVE mg/dL
Leukocytes,Ua: NEGATIVE
Nitrite: NEGATIVE
Protein, ur: 30 mg/dL — AB
Specific Gravity, Urine: 1.014 (ref 1.005–1.030)
pH: 5 (ref 5.0–8.0)

## 2023-10-06 MED ORDER — SODIUM CHLORIDE 0.9 % IV SOLN
1920.0000 mg/m2 | INTRAVENOUS | Status: DC
  Administered 2023-10-06: 3500 mg via INTRAVENOUS
  Filled 2023-10-06: qty 70

## 2023-10-06 MED ORDER — SODIUM CHLORIDE 0.9 % IV SOLN
320.0000 mg/m2 | Freq: Once | INTRAVENOUS | Status: AC
  Administered 2023-10-06: 608 mg via INTRAVENOUS
  Filled 2023-10-06: qty 30.4

## 2023-10-06 MED ORDER — ATROPINE SULFATE 1 MG/ML IV SOLN
0.5000 mg | Freq: Once | INTRAVENOUS | Status: AC
  Administered 2023-10-06: 0.5 mg via INTRAVENOUS
  Filled 2023-10-06: qty 1

## 2023-10-06 MED ORDER — FLUOROURACIL CHEMO INJECTION 2.5 GM/50ML
320.0000 mg/m2 | Freq: Once | INTRAVENOUS | Status: AC
Start: 2023-10-06 — End: 2023-10-06
  Administered 2023-10-06: 600 mg via INTRAVENOUS
  Filled 2023-10-06: qty 12

## 2023-10-06 MED ORDER — PALONOSETRON HCL INJECTION 0.25 MG/5ML
0.2500 mg | Freq: Once | INTRAVENOUS | Status: AC
  Administered 2023-10-06: 0.25 mg via INTRAVENOUS
  Filled 2023-10-06: qty 5

## 2023-10-06 MED ORDER — SODIUM CHLORIDE 0.9% FLUSH
10.0000 mL | INTRAVENOUS | Status: DC | PRN
  Administered 2023-10-06: 10 mL via INTRAVENOUS

## 2023-10-06 MED ORDER — DEXAMETHASONE SODIUM PHOSPHATE 10 MG/ML IJ SOLN
10.0000 mg | Freq: Once | INTRAMUSCULAR | Status: AC
  Administered 2023-10-06: 10 mg via INTRAVENOUS
  Filled 2023-10-06: qty 1

## 2023-10-06 MED ORDER — SODIUM CHLORIDE 0.9 % IV SOLN: INTRAVENOUS | Status: DC

## 2023-10-06 MED ORDER — SODIUM CHLORIDE 0.9 % IV SOLN
5.0000 mg/kg | Freq: Once | INTRAVENOUS | Status: AC
  Administered 2023-10-06: 400 mg via INTRAVENOUS
  Filled 2023-10-06: qty 16

## 2023-10-06 MED ORDER — SODIUM CHLORIDE 0.9 % IV SOLN
144.0000 mg/m2 | Freq: Once | INTRAVENOUS | Status: AC
  Administered 2023-10-06: 300 mg via INTRAVENOUS
  Filled 2023-10-06: qty 15

## 2023-10-06 NOTE — Progress Notes (Signed)
 Maintain irinotecan dose at 300 mg for today.  Dr. Carilyn Goodpasture, PharmD

## 2023-10-06 NOTE — Progress Notes (Signed)
 Patient has been examined by Dr. Ellin Saba. Vital signs and labs have been reviewed by MD - ANC, Creatinine, LFTs, hemoglobin, and platelets are within treatment parameters per M.D. - pt may proceed with treatment.  Primary RN and pharmacy notified.

## 2023-10-06 NOTE — Patient Instructions (Signed)
 [facility] - A DEPT OF [facility]  Discharge Instructions: Thank you for choosing [facility] to provide your oncology and hematology care.  If you have a lab appointment with the Cancer Center - please note that after April 8th, 2024, all labs will be drawn in the cancer center.  You do not have to check in or register with the main entrance as you have in the past but will complete your check-in in the cancer center.  Wear comfortable clothing and clothing appropriate for easy access to any Portacath or PICC line.   We strive to give you quality time with your provider. You may need to reschedule your appointment if you arrive late (15 or more minutes).  Arriving late affects you and other patients whose appointments are after yours.  Also, if you miss three or more appointments without notifying the office, you may be dismissed from the clinic at the provider's discretion.      For prescription refill requests, have your pharmacy contact our office and allow 72 hours for refills to be completed.    Today you received the following chemotherapy and/or immunotherapy agents MVASI/Irinotecan/Leucovorin/5FU.  Bevacizumab Injection What is this medication? BEVACIZUMAB (be va SIZ yoo mab) treats some types of cancer. It works by blocking a protein that causes cancer cells to grow and multiply. This helps to slow or stop the spread of cancer cells. It is a monoclonal antibody. This medicine may be used for other purposes; ask your health care provider or pharmacist if you have questions. COMMON BRAND NAME(S): Alymsys, Avastin, MVASI, [place] Lao What should I tell my care team before I take this medication? They need to know if you have any of these conditions: Blood clots Coughing up blood Having or recent surgery Heart failure High blood pressure History of a connection between 2 or more body parts that do not usually connect  (fistula) History of a tear in your stomach or intestines Protein in your urine An unusual or allergic reaction to bevacizumab, other medications, foods, dyes, or preservatives Pregnant or trying to get pregnant Breast-feeding How should I use this medication? This medication is injected into a vein. It is given by your care team in a hospital or clinic setting. Talk to your care team the use of this medication in children. Special care may be needed. Overdosage: If you think you have taken too much of this medicine contact a poison control center or emergency room at once. NOTE: This medicine is only for you. Do not share this medicine with others. What if I miss a dose? Keep appointments for follow-up doses. It is important not to miss your dose. Call your care team if you are unable to keep an appointment. What may interact with this medication? Interactions are not expected. This list may not describe all possible interactions. Give your health care provider a list of all the medicines, herbs, non-prescription drugs, or dietary supplements you use. Also tell them if you smoke, drink alcohol, or use illegal drugs. Some items may interact with your medicine. What should I watch for while using this medication? Your condition will be monitored carefully while you are receiving this medication. You may need blood work while taking this medication. This medication may make you feel generally unwell. This is not uncommon as chemotherapy can affect healthy cells as well as cancer cells. Report any side effects. Continue your course of treatment even though you feel  ill unless your care team tells you to stop. This medication may increase your risk to bruise or bleed. Call your care team if you notice any unusual bleeding. Before having surgery, talk to your care team to make sure it is ok. This medication can increase the risk of poor healing of your surgical site or wound. You will need to stop  this medication for 28 days before surgery. After surgery, wait at least 28 days before restarting this medication. Make sure the surgical site or wound is healed enough before restarting this medication. Talk to your care team if questions. Talk to your care team if you may be pregnant. Serious birth defects can occur if you take this medication during pregnancy and for 6 months after the last dose. Contraception is recommended while taking this medication and for 6 months after the last dose. Your care team can help you find the option that works for you. Do not breastfeed while taking this medication and for 6 months after the last dose. This medication can cause infertility. Talk to your care team if you are concerned about your fertility. What side effects may I notice from receiving this medication? Side effects that you should report to your care team as soon as possible: Allergic reactions--skin rash, itching, hives, swelling of the face, lips, tongue, or throat Bleeding--bloody or black, tar-like stools, vomiting blood or Arlone Lenhardt material that looks like coffee grounds, red or dark Serrita Lueth urine, small red or purple spots on skin, unusual bruising or bleeding Blood clot--pain, swelling, or warmth in the leg, shortness of breath, chest pain Heart attack--pain or tightness in the chest, shoulders, arms, or jaw, nausea, shortness of breath, cold or clammy skin, feeling faint or lightheaded Heart failure--shortness of breath, swelling of the ankles, feet, or hands, sudden weight gain, unusual weakness or fatigue Increase in blood pressure Infection--fever, chills, cough, sore throat, wounds that don't heal, pain or trouble when passing urine, general feeling of discomfort or being unwell Infusion reactions--chest pain, shortness of breath or trouble breathing, feeling faint or lightheaded Kidney injury--decrease in the amount of urine, swelling of the ankles, hands, or feet Stomach pain that is  severe, does not go away, or gets worse Stroke--sudden numbness or weakness of the face, arm, or leg, trouble speaking, confusion, trouble walking, loss of balance or coordination, dizziness, severe headache, change in vision Sudden and severe headache, confusion, change in vision, seizures, which may be signs of posterior reversible encephalopathy syndrome (PRES) Side effects that usually do not require medical attention (report to your care team if they continue or are bothersome): Back pain Change in taste Diarrhea Dry skin Increased tears Nosebleed This list may not describe all possible side effects. Call your doctor for medical advice about side effects. You may report side effects to FDA at 1-800-FDA-1088. Where should I keep my medication? This medication is given in a hospital or clinic. It will not be stored at home. NOTE: This sheet is a summary. It may not cover all possible information. If you have questions about this medicine, talk to your doctor, pharmacist, or health care provider.  2024 Elsevier/Gold Standard (2021-10-31 00:00:00)   Irinotecan Injection What is this medication? IRINOTECAN (ir in oh TEE kan) treats some types of cancer. It works by slowing down the growth of cancer cells. This medicine may be used for other purposes; ask your health care provider or pharmacist if you have questions. COMMON BRAND NAME(S): Camptosar What should I tell my care team  before I take this medication? They need to know if you have any of these conditions: Dehydration Diarrhea Infection, especially a viral infection, such as chickenpox, cold sores, herpes Liver disease Low blood cell levels (white cells, red cells, and platelets) Low levels of electrolytes, such as calcium, magnesium, or potassium in your blood Recent or ongoing radiation An unusual or allergic reaction to irinotecan, other medications, foods, dyes, or preservatives If you or your partner are pregnant or  trying to get pregnant Breast-feeding How should I use this medication? This medication is injected into a vein. It is given by your care team in a hospital or clinic setting. Talk to your care team about the use of this medication in children. Special care may be needed. Overdosage: If you think you have taken too much of this medicine contact a poison control center or emergency room at once. NOTE: This medicine is only for you. Do not share this medicine with others. What if I miss a dose? Keep appointments for follow-up doses. It is important not to miss your dose. Call your care team if you are unable to keep an appointment. What may interact with this medication? Do not take this medication with any of the following: Cobicistat Itraconazole This medication may also interact with the following: Certain antibiotics, such as clarithromycin, rifampin, rifabutin Certain antivirals for HIV or AIDS Certain medications for fungal infections, such as ketoconazole, posaconazole, voriconazole Certain medications for seizures, such as carbamazepine, phenobarbital, phenytoin Gemfibrozil Nefazodone St. John's wort This list may not describe all possible interactions. Give your health care provider a list of all the medicines, herbs, non-prescription drugs, or dietary supplements you use. Also tell them if you smoke, drink alcohol, or use illegal drugs. Some items may interact with your medicine. What should I watch for while using this medication? Your condition will be monitored carefully while you are receiving this medication. You may need blood work while taking this medication. This medication may make you feel generally unwell. This is not uncommon as chemotherapy can affect healthy cells as well as cancer cells. Report any side effects. Continue your course of treatment even though you feel ill unless your care team tells you to stop. This medication can cause serious side effects. To reduce  the risk, your care team may give you other medications to take before receiving this one. Be sure to follow the directions from your care team. This medication may affect your coordination, reaction time, or judgement. Do not drive or operate machinery until you know how this medication affects you. Sit up or stand slowly to reduce the risk of dizzy or fainting spells. Drinking alcohol with this medication can increase the risk of these side effects. This medication may increase your risk of getting an infection. Call your care team for advice if you get a fever, chills, sore throat, or other symptoms of a cold or flu. Do not treat yourself. Try to avoid being around people who are sick. Avoid taking medications that contain aspirin, acetaminophen, ibuprofen, naproxen, or ketoprofen unless instructed by your care team. These medications may hide a fever. This medication may increase your risk to bruise or bleed. Call your care team if you notice any unusual bleeding. Be careful brushing or flossing your teeth or using a toothpick because you may get an infection or bleed more easily. If you have any dental work done, tell your dentist you are receiving this medication. Talk to your care team if you or your  partner are pregnant or think either of you might be pregnant. This medication can cause serious birth defects if taken during pregnancy and for 6 months after the last dose. You will need a negative pregnancy test before starting this medication. Contraception is recommended while taking this medication and for 6 months after the last dose. Your care team can help you find the option that works for you. Do not father a child while taking this medication and for 3 months after the last dose. Use a condom for contraception during this time period. Do not breastfeed while taking this medication and for 7 days after the last dose. This medication may cause infertility. Talk to your care team if you are  concerned about your fertility. What side effects may I notice from receiving this medication? Side effects that you should report to your care team as soon as possible: Allergic reactions--skin rash, itching, hives, swelling of the face, lips, tongue, or throat Dry cough, shortness of breath or trouble breathing Increased saliva or tears, increased sweating, stomach cramping, diarrhea, small pupils, unusual weakness or fatigue, slow heartbeat Infection--fever, chills, cough, sore throat, wounds that don't heal, pain or trouble when passing urine, general feeling of discomfort or being unwell Kidney injury--decrease in the amount of urine, swelling of the ankles, hands, or feet Low red blood cell level--unusual weakness or fatigue, dizziness, headache, trouble breathing Severe or prolonged diarrhea Unusual bruising or bleeding Side effects that usually do not require medical attention (report to your care team if they continue or are bothersome): Constipation Diarrhea Hair loss Loss of appetite Nausea Stomach pain This list may not describe all possible side effects. Call your doctor for medical advice about side effects. You may report side effects to FDA at 1-800-FDA-1088. Where should I keep my medication? This medication is given in a hospital or clinic. It will not be stored at home. NOTE: This sheet is a summary. It may not cover all possible information. If you have questions about this medicine, talk to your doctor, pharmacist, or health care provider.  2024 Elsevier/Gold Standard (2021-10-27 00:00:00)   Leucovorin Injection What is this medication? LEUCOVORIN (loo koe VOR in) prevents side effects from certain medications, such as methotrexate. It works by increasing folate levels. This helps protect healthy cells in your body. It may also be used to treat anemia caused by low levels of folate. It can also be used with fluorouracil, a type of chemotherapy, to treat colorectal  cancer. It works by increasing the effects of fluorouracil in the body. This medicine may be used for other purposes; ask your health care provider or pharmacist if you have questions. What should I tell my care team before I take this medication? They need to know if you have any of these conditions: Anemia from low levels of vitamin B12 in the blood An unusual or allergic reaction to leucovorin, folic acid, other medications, foods, dyes, or preservatives Pregnant or trying to get pregnant Breastfeeding How should I use this medication? This medication is injected into a vein or a muscle. It is given by your care team in a hospital or clinic setting. Talk to your care team about the use of this medication in children. Special care may be needed. Overdosage: If you think you have taken too much of this medicine contact a poison control center or emergency room at once. NOTE: This medicine is only for you. Do not share this medicine with others. What if I miss a dose?  Keep appointments for follow-up doses. It is important not to miss your dose. Call your care team if you are unable to keep an appointment. What may interact with this medication? Capecitabine Fluorouracil Phenobarbital Phenytoin Primidone Trimethoprim;sulfamethoxazole This list may not describe all possible interactions. Give your health care provider a list of all the medicines, herbs, non-prescription drugs, or dietary supplements you use. Also tell them if you smoke, drink alcohol, or use illegal drugs. Some items may interact with your medicine. What should I watch for while using this medication? Your condition will be monitored carefully while you are receiving this medication. This medication may increase the side effects of 5-fluorouracil. Tell your care team if you have diarrhea or mouth sores that do not get better or that get worse. What side effects may I notice from receiving this medication? Side effects that  you should report to your care team as soon as possible: Allergic reactions--skin rash, itching, hives, swelling of the face, lips, tongue, or throat This list may not describe all possible side effects. Call your doctor for medical advice about side effects. You may report side effects to FDA at 1-800-FDA-1088. Where should I keep my medication? This medication is given in a hospital or clinic. It will not be stored at home. NOTE: This sheet is a summary. It may not cover all possible information. If you have questions about this medicine, talk to your doctor, pharmacist, or health care provider.  2024 Elsevier/Gold Standard (2021-11-18 00:00:00)   Fluorouracil Injection What is this medication? FLUOROURACIL (flure oh YOOR a sil) treats some types of cancer. It works by slowing down the growth of cancer cells. This medicine may be used for other purposes; ask your health care provider or pharmacist if you have questions. COMMON BRAND NAME(S): Adrucil What should I tell my care team before I take this medication? They need to know if you have any of these conditions: Blood disorders Dihydropyrimidine dehydrogenase (DPD) deficiency Infection, such as chickenpox, cold sores, herpes Kidney disease Liver disease Poor nutrition Recent or ongoing radiation therapy An unusual or allergic reaction to fluorouracil, other medications, foods, dyes, or preservatives If you or your partner are pregnant or trying to get pregnant Breast-feeding How should I use this medication? This medication is injected into a vein. It is administered by your care team in a hospital or clinic setting. Talk to your care team about the use of this medication in children. Special care may be needed. Overdosage: If you think you have taken too much of this medicine contact a poison control center or emergency room at once. NOTE: This medicine is only for you. Do not share this medicine with others. What if I miss a  dose? Keep appointments for follow-up doses. It is important not to miss your dose. Call your care team if you are unable to keep an appointment. What may interact with this medication? Do not take this medication with any of the following: Live virus vaccines This medication may also interact with the following: Medications that treat or prevent blood clots, such as warfarin, enoxaparin, dalteparin This list may not describe all possible interactions. Give your health care provider a list of all the medicines, herbs, non-prescription drugs, or dietary supplements you use. Also tell them if you smoke, drink alcohol, or use illegal drugs. Some items may interact with your medicine. What should I watch for while using this medication? Your condition will be monitored carefully while you are receiving this medication. This medication  may make you feel generally unwell. This is not uncommon as chemotherapy can affect healthy cells as well as cancer cells. Report any side effects. Continue your course of treatment even though you feel ill unless your care team tells you to stop. In some cases, you may be given additional medications to help with side effects. Follow all directions for their use. This medication may increase your risk of getting an infection. Call your care team for advice if you get a fever, chills, sore throat, or other symptoms of a cold or flu. Do not treat yourself. Try to avoid being around people who are sick. This medication may increase your risk to bruise or bleed. Call your care team if you notice any unusual bleeding. Be careful brushing or flossing your teeth or using a toothpick because you may get an infection or bleed more easily. If you have any dental work done, tell your dentist you are receiving this medication. Avoid taking medications that contain aspirin, acetaminophen, ibuprofen, naproxen, or ketoprofen unless instructed by your care team. These medications may hide  a fever. Do not treat diarrhea with over the counter products. Contact your care team if you have diarrhea that lasts more than 2 days or if it is severe and watery. This medication can make you more sensitive to the sun. Keep out of the sun. If you cannot avoid being in the sun, wear protective clothing and sunscreen. Do not use sun lamps, tanning beds, or tanning booths. Talk to your care team if you or your partner wish to become pregnant or think you might be pregnant. This medication can cause serious birth defects if taken during pregnancy and for 3 months after the last dose. A reliable form of contraception is recommended while taking this medication and for 3 months after the last dose. Talk to your care team about effective forms of contraception. Do not father a child while taking this medication and for 3 months after the last dose. Use a condom while having sex during this time period. Do not breastfeed while taking this medication. This medication may cause infertility. Talk to your care team if you are concerned about your fertility. What side effects may I notice from receiving this medication? Side effects that you should report to your care team as soon as possible: Allergic reactions--skin rash, itching, hives, swelling of the face, lips, tongue, or throat Heart attack--pain or tightness in the chest, shoulders, arms, or jaw, nausea, shortness of breath, cold or clammy skin, feeling faint or lightheaded Heart failure--shortness of breath, swelling of the ankles, feet, or hands, sudden weight gain, unusual weakness or fatigue Heart rhythm changes--fast or irregular heartbeat, dizziness, feeling faint or lightheaded, chest pain, trouble breathing High ammonia level--unusual weakness or fatigue, confusion, loss of appetite, nausea, vomiting, seizures Infection--fever, chills, cough, sore throat, wounds that don't heal, pain or trouble when passing urine, general feeling of discomfort or  being unwell Low red blood cell level--unusual weakness or fatigue, dizziness, headache, trouble breathing Pain, tingling, or numbness in the hands or feet, muscle weakness, change in vision, confusion or trouble speaking, loss of balance or coordination, trouble walking, seizures Redness, swelling, and blistering of the skin over hands and feet Severe or prolonged diarrhea Unusual bruising or bleeding Side effects that usually do not require medical attention (report to your care team if they continue or are bothersome): Dry skin Headache Increased tears Nausea Pain, redness, or swelling with sores inside the mouth or throat Sensitivity to  light Vomiting This list may not describe all possible side effects. Call your doctor for medical advice about side effects. You may report side effects to FDA at 1-800-FDA-1088. Where should I keep my medication? This medication is given in a hospital or clinic. It will not be stored at home. NOTE: This sheet is a summary. It may not cover all possible information. If you have questions about this medicine, talk to your doctor, pharmacist, or health care provider.  2024 Elsevier/Gold Standard (2021-10-21 00:00:00)       To help prevent nausea and vomiting after your treatment, we encourage you to take your nausea medication as directed.  BELOW ARE SYMPTOMS THAT SHOULD BE REPORTED IMMEDIATELY: *FEVER GREATER THAN 100.4 F (38 C) OR HIGHER *CHILLS OR SWEATING *NAUSEA AND VOMITING THAT IS NOT CONTROLLED WITH YOUR NAUSEA MEDICATION *UNUSUAL SHORTNESS OF BREATH *UNUSUAL BRUISING OR BLEEDING *URINARY PROBLEMS (pain or burning when urinating, or frequent urination) *BOWEL PROBLEMS (unusual diarrhea, constipation, pain near the anus) TENDERNESS IN MOUTH AND THROAT WITH OR WITHOUT PRESENCE OF ULCERS (sore throat, sores in mouth, or a toothache) UNUSUAL RASH, SWELLING OR PAIN  UNUSUAL VAGINAL DISCHARGE OR ITCHING   Items with * indicate a potential  emergency and should be followed up as soon as possible or go to the Emergency Department if any problems should occur.  Please show the CHEMOTHERAPY ALERT CARD or IMMUNOTHERAPY ALERT CARD at check-in to the Emergency Department and triage nurse.  Should you have questions after your visit or need to cancel or reschedule your appointment, please contact [facility] [place] - A DEPT OF [facility] [phone]  and follow the prompts.  Office hours are 8:00 a.m. to 4:30 p.m. Monday - Friday. Please note that voicemails left after 4:00 p.m. may not be returned until the following business day.  We are closed weekends and major holidays. You have access to a nurse at all times for urgent questions. Please call the main number to the clinic [phone] and follow the prompts.  For any non-urgent questions, you may also contact your provider using MyChart. We now offer e-Visits for anyone 73 and older to request care online for non-urgent symptoms. For details visit [email or web address].   Also download the MyChart app! Go to the app store, search "MyChart", open the app, select [place], and log in with your MyChart username and password.

## 2023-10-06 NOTE — Progress Notes (Signed)
 Patient presents today for chemotherapy infusion. Patient is in satisfactory condition with no new complaints voiced.  Vital signs are stable.  Labs reviewed by Dr. Ellin Saba during the office visit and all labs are within treatment parameters.  Urine protein= 30.  We will proceed with treatment per MD orders.   Patient tolerated treatment well with no complaints voiced.  Home infusion 5FU pump connected.  Patient left ambulatory in stable condition.  Vital signs stable at discharge.  Follow up as scheduled.

## 2023-10-06 NOTE — Patient Instructions (Signed)

## 2023-10-07 ENCOUNTER — Other Ambulatory Visit: Payer: Self-pay

## 2023-10-07 LAB — CEA: CEA: 10.6 ng/mL — ABNORMAL HIGH (ref 0.0–4.7)

## 2023-10-08 ENCOUNTER — Inpatient Hospital Stay

## 2023-10-08 VITALS — BP 129/70 | HR 74 | Temp 98.0°F | Resp 18

## 2023-10-08 DIAGNOSIS — Z5111 Encounter for antineoplastic chemotherapy: Secondary | ICD-10-CM | POA: Diagnosis not present

## 2023-10-08 DIAGNOSIS — C189 Malignant neoplasm of colon, unspecified: Secondary | ICD-10-CM

## 2023-10-08 MED ORDER — SODIUM CHLORIDE 0.9% FLUSH
10.0000 mL | INTRAVENOUS | Status: DC | PRN
  Administered 2023-10-08: 10 mL

## 2023-10-08 MED ORDER — HEPARIN SOD (PORK) LOCK FLUSH 100 UNIT/ML IV SOLN
500.0000 [IU] | Freq: Once | INTRAVENOUS | Status: AC | PRN
  Administered 2023-10-08: 500 [IU]

## 2023-10-08 MED ORDER — PEGFILGRASTIM-JMDB 6 MG/0.6ML ~~LOC~~ SOSY
6.0000 mg | PREFILLED_SYRINGE | Freq: Once | SUBCUTANEOUS | Status: AC
  Administered 2023-10-08: 6 mg via SUBCUTANEOUS
  Filled 2023-10-08: qty 0.6

## 2023-10-08 NOTE — Progress Notes (Signed)
Chemo pump disconnected. Port flushed with good blood return noted. No bruising or swelling at site. Bandaid applied and patient discharged in satisfactory condition. VVS stable with no signs or symptoms of distressed noted. 

## 2023-10-08 NOTE — Patient Instructions (Signed)
 [facility] [place] - A DEPT OF [facility]  Discharge Instructions: Thank you for choosing [facility] to provide your oncology and hematology care.  If you have a lab appointment with the Cancer Center - please note that after April 8th, 2024, all labs will be drawn in the cancer center.  You do not have to check in or register with the main entrance as you have in the past but will complete your check-in in the cancer center.  Wear comfortable clothing and clothing appropriate for easy access to any Portacath or PICC line.   We strive to give you quality time with your provider. You may need to reschedule your appointment if you arrive late (15 or more minutes).  Arriving late affects you and other patients whose appointments are after yours.  Also, if you miss three or more appointments without notifying the office, you may be dismissed from the clinic at the provider's discretion.      For prescription refill requests, have your pharmacy contact our office and allow 72 hours for refills to be completed.    Today you received the following Port flushed and chemo pump removed return as scheduled.   To help prevent nausea and vomiting after your treatment, we encourage you to take your nausea medication as directed.  BELOW ARE SYMPTOMS THAT SHOULD BE REPORTED IMMEDIATELY: *FEVER GREATER THAN 100.4 F (38 C) OR HIGHER *CHILLS OR SWEATING *NAUSEA AND VOMITING THAT IS NOT CONTROLLED WITH YOUR NAUSEA MEDICATION *UNUSUAL SHORTNESS OF BREATH *UNUSUAL BRUISING OR BLEEDING *URINARY PROBLEMS (pain or burning when urinating, or frequent urination) *BOWEL PROBLEMS (unusual diarrhea, constipation, pain near the anus) TENDERNESS IN MOUTH AND THROAT WITH OR WITHOUT PRESENCE OF ULCERS (sore throat, sores in mouth, or a toothache) UNUSUAL RASH, SWELLING OR PAIN  UNUSUAL VAGINAL DISCHARGE OR ITCHING   Items with * indicate a potential emergency and should be followed  up as soon as possible or go to the Emergency Department if any problems should occur.  Please show the CHEMOTHERAPY ALERT CARD or IMMUNOTHERAPY ALERT CARD at check-in to the Emergency Department and triage nurse.  Should you have questions after your visit or need to cancel or reschedule your appointment, please contact [facility] CANCER CTR [place] - A DEPT OF [facility] [phone]  and follow the prompts.  Office hours are 8:00 a.m. to 4:30 p.m. Monday - Friday. Please note that voicemails left after 4:00 p.m. may not be returned until the following business day.  We are closed weekends and major holidays. You have access to a nurse at all times for urgent questions. Please call the main number to the clinic [phone] and follow the prompts.  For any non-urgent questions, you may also contact your provider using MyChart. We now offer e-Visits for anyone 97 and older to request care online for non-urgent symptoms. For details visit [email or web address].   Also download the MyChart app! Go to the app store, search "MyChart", open the app, select [place], and log in with your MyChart username and password.

## 2023-10-20 ENCOUNTER — Inpatient Hospital Stay

## 2023-10-20 ENCOUNTER — Encounter: Payer: Self-pay | Admitting: Hematology

## 2023-10-20 ENCOUNTER — Inpatient Hospital Stay: Admitting: Hematology

## 2023-10-20 VITALS — BP 130/66 | HR 66 | Temp 96.9°F | Resp 18 | Wt 171.1 lb

## 2023-10-20 VITALS — BP 139/70 | HR 73 | Resp 19

## 2023-10-20 DIAGNOSIS — C189 Malignant neoplasm of colon, unspecified: Secondary | ICD-10-CM

## 2023-10-20 DIAGNOSIS — Z95828 Presence of other vascular implants and grafts: Secondary | ICD-10-CM

## 2023-10-20 DIAGNOSIS — Z5111 Encounter for antineoplastic chemotherapy: Secondary | ICD-10-CM | POA: Diagnosis not present

## 2023-10-20 LAB — CBC WITH DIFFERENTIAL/PLATELET
Abs Immature Granulocytes: 0.1 10*3/uL — ABNORMAL HIGH (ref 0.00–0.07)
Basophils Absolute: 0.1 10*3/uL (ref 0.0–0.1)
Basophils Relative: 1 %
Eosinophils Absolute: 0.2 10*3/uL (ref 0.0–0.5)
Eosinophils Relative: 2 %
HCT: 36.3 % (ref 36.0–46.0)
Hemoglobin: 11.2 g/dL — ABNORMAL LOW (ref 12.0–15.0)
Immature Granulocytes: 1 %
Lymphocytes Relative: 19 %
Lymphs Abs: 1.6 10*3/uL (ref 0.7–4.0)
MCH: 29.9 pg (ref 26.0–34.0)
MCHC: 30.9 g/dL (ref 30.0–36.0)
MCV: 96.8 fL (ref 80.0–100.0)
Monocytes Absolute: 0.7 10*3/uL (ref 0.1–1.0)
Monocytes Relative: 9 %
Neutro Abs: 5.7 10*3/uL (ref 1.7–7.7)
Neutrophils Relative %: 68 %
Platelets: 109 10*3/uL — ABNORMAL LOW (ref 150–400)
RBC: 3.75 MIL/uL — ABNORMAL LOW (ref 3.87–5.11)
RDW: 18.6 % — ABNORMAL HIGH (ref 11.5–15.5)
WBC: 8.4 10*3/uL (ref 4.0–10.5)
nRBC: 0 % (ref 0.0–0.2)

## 2023-10-20 LAB — COMPREHENSIVE METABOLIC PANEL WITH GFR
ALT: 27 U/L (ref 0–44)
AST: 34 U/L (ref 15–41)
Albumin: 3.5 g/dL (ref 3.5–5.0)
Alkaline Phosphatase: 167 U/L — ABNORMAL HIGH (ref 38–126)
Anion gap: 12 (ref 5–15)
BUN: 10 mg/dL (ref 8–23)
CO2: 23 mmol/L (ref 22–32)
Calcium: 9.1 mg/dL (ref 8.9–10.3)
Chloride: 102 mmol/L (ref 98–111)
Creatinine, Ser: 0.88 mg/dL (ref 0.44–1.00)
GFR, Estimated: >60 mL/min (ref >60)
Glucose, Bld: 110 mg/dL — ABNORMAL HIGH (ref 70–99)
Potassium: 3.3 mmol/L — ABNORMAL LOW (ref 3.5–5.1)
Sodium: 137 mmol/L (ref 135–145)
Total Bilirubin: 0.8 mg/dL (ref 0.0–1.2)
Total Protein: 6.9 g/dL (ref 6.5–8.1)

## 2023-10-20 LAB — MAGNESIUM: Magnesium: 2 mg/dL (ref 1.7–2.4)

## 2023-10-20 MED ORDER — SODIUM CHLORIDE 0.9 % IV SOLN
5.0000 mg/kg | Freq: Once | INTRAVENOUS | Status: AC
  Administered 2023-10-20: 400 mg via INTRAVENOUS
  Filled 2023-10-20: qty 16

## 2023-10-20 MED ORDER — POTASSIUM CHLORIDE CRYS ER 20 MEQ PO TBCR
40.0000 meq | EXTENDED_RELEASE_TABLET | Freq: Once | ORAL | Status: AC
  Administered 2023-10-20: 40 meq via ORAL
  Filled 2023-10-20: qty 2

## 2023-10-20 MED ORDER — SODIUM CHLORIDE 0.9 % IV SOLN
320.0000 mg/m2 | Freq: Once | INTRAVENOUS | Status: AC
  Administered 2023-10-20: 608 mg via INTRAVENOUS
  Filled 2023-10-20: qty 30.4

## 2023-10-20 MED ORDER — SODIUM CHLORIDE FLUSH 0.9 % IV SOLN
10.0000 mL | Freq: Once | INTRAVENOUS | Status: AC
  Administered 2023-10-20: 10 mL via INTRAVENOUS
  Filled 2023-10-20: qty 10

## 2023-10-20 MED ORDER — SODIUM CHLORIDE 0.9 % IV SOLN
INTRAVENOUS | Status: DC
Start: 2023-10-20 — End: 2023-10-20

## 2023-10-20 MED ORDER — SODIUM CHLORIDE 0.9 % IV SOLN
1920.0000 mg/m2 | INTRAVENOUS | Status: DC
  Administered 2023-10-20: 3500 mg via INTRAVENOUS
  Filled 2023-10-20: qty 70

## 2023-10-20 MED ORDER — FLUOROURACIL CHEMO INJECTION 2.5 GM/50ML
320.0000 mg/m2 | Freq: Once | INTRAVENOUS | Status: AC
  Administered 2023-10-20: 600 mg via INTRAVENOUS
  Filled 2023-10-20: qty 12

## 2023-10-20 MED ORDER — ALTEPLASE 2 MG IJ SOLR
2.0000 mg | Freq: Once | INTRAMUSCULAR | Status: AC
  Administered 2023-10-20: 2 mg
  Filled 2023-10-20: qty 2

## 2023-10-20 MED ORDER — PALONOSETRON HCL INJECTION 0.25 MG/5ML
0.2500 mg | Freq: Once | INTRAVENOUS | Status: AC
  Administered 2023-10-20: 0.25 mg via INTRAVENOUS
  Filled 2023-10-20: qty 5

## 2023-10-20 MED ORDER — SODIUM CHLORIDE 0.9 % IV SOLN
144.0000 mg/m2 | Freq: Once | INTRAVENOUS | Status: AC
  Administered 2023-10-20: 300 mg via INTRAVENOUS
  Filled 2023-10-20: qty 15

## 2023-10-20 MED ORDER — DEXAMETHASONE SODIUM PHOSPHATE 10 MG/ML IJ SOLN
10.0000 mg | Freq: Once | INTRAMUSCULAR | Status: AC
  Administered 2023-10-20: 10 mg via INTRAVENOUS
  Filled 2023-10-20: qty 1

## 2023-10-20 MED ORDER — ATROPINE SULFATE 1 MG/ML IV SOLN
0.5000 mg | Freq: Once | INTRAVENOUS | Status: AC
Start: 2023-10-20 — End: 2023-10-20
  Administered 2023-10-20: 0.5 mg via INTRAVENOUS
  Filled 2023-10-20: qty 1

## 2023-10-20 NOTE — Progress Notes (Signed)
 Patient tolerated chemotherapy with no complaints voiced.  Side effects with management reviewed with understanding verbalized.  Port site clean and dry with no bruising or swelling noted at site.  Good blood return noted before and after administration of chemotherapy.  5FU pump started for home use. Pt due to return to clinic on 10/22/23 for pump stop.  Patient left in satisfactory condition with VSS and no s/s of distress noted. All follow ups as scheduled.   Susan Martin [employer]

## 2023-10-20 NOTE — Patient Instructions (Signed)
 [facility] [place] - A DEPT OF [facility]  Discharge Instructions: Thank you for choosing [facility] to provide your oncology and hematology care.  If you have a lab appointment with the Cancer Center - please note that after April 8th, 2024, all labs will be drawn in the cancer center.  You do not have to check in or register with the main entrance as you have in the past but will complete your check-in in the cancer center.  Wear comfortable clothing and clothing appropriate for easy access to any Portacath or PICC line.   We strive to give you quality time with your provider. You may need to reschedule your appointment if you arrive late (15 or more minutes).  Arriving late affects you and other patients whose appointments are after yours.  Also, if you miss three or more appointments without notifying the office, you may be dismissed from the clinic at the provider's discretion.      For prescription refill requests, have your pharmacy contact our office and allow 72 hours for refills to be completed.    Today you received the following chemotherapy and/or immunotherapy agents Avastin , irinotecan , leucovorin . Adrucil     To help prevent nausea and vomiting after your treatment, we encourage you to take your nausea medication as directed.  BELOW ARE SYMPTOMS THAT SHOULD BE REPORTED IMMEDIATELY: *FEVER GREATER THAN 100.4 F (38 C) OR HIGHER *CHILLS OR SWEATING *NAUSEA AND VOMITING THAT IS NOT CONTROLLED WITH YOUR NAUSEA MEDICATION *UNUSUAL SHORTNESS OF BREATH *UNUSUAL BRUISING OR BLEEDING *URINARY PROBLEMS (pain or burning when urinating, or frequent urination) *BOWEL PROBLEMS (unusual diarrhea, constipation, pain near the anus) TENDERNESS IN MOUTH AND THROAT WITH OR WITHOUT PRESENCE OF ULCERS (sore throat, sores in mouth, or a toothache) UNUSUAL RASH, SWELLING OR PAIN  UNUSUAL VAGINAL DISCHARGE OR ITCHING   Items with * indicate a potential  emergency and should be followed up as soon as possible or go to the Emergency Department if any problems should occur.  Please show the CHEMOTHERAPY ALERT CARD or IMMUNOTHERAPY ALERT CARD at check-in to the Emergency Department and triage nurse.  Should you have questions after your visit or need to cancel or reschedule your appointment, please contact [facility] CANCER CTR [place] - A DEPT OF [facility] [phone]  and follow the prompts.  Office hours are 8:00 a.m. to 4:30 p.m. Monday - Friday. Please note that voicemails left after 4:00 p.m. may not be returned until the following business day.  We are closed weekends and major holidays. You have access to a nurse at all times for urgent questions. Please call the main number to the clinic [phone] and follow the prompts.  For any non-urgent questions, you may also contact your provider using MyChart. We now offer e-Visits for anyone 48 and older to request care online for non-urgent symptoms. For details visit [email or web address].   Also download the MyChart app! Go to the app store, search "MyChart", open the app, select [place], and log in with your MyChart username and password.

## 2023-10-21 LAB — CEA: CEA: 9.4 ng/mL — ABNORMAL HIGH (ref 0.0–4.7)

## 2023-10-22 ENCOUNTER — Other Ambulatory Visit: Payer: Self-pay | Admitting: Hematology

## 2023-10-22 ENCOUNTER — Inpatient Hospital Stay

## 2023-10-22 VITALS — BP 140/72 | HR 75 | Temp 97.2°F | Resp 18

## 2023-10-22 DIAGNOSIS — C189 Malignant neoplasm of colon, unspecified: Secondary | ICD-10-CM

## 2023-10-22 DIAGNOSIS — Z5111 Encounter for antineoplastic chemotherapy: Secondary | ICD-10-CM | POA: Diagnosis not present

## 2023-10-22 MED ORDER — SODIUM CHLORIDE 0.9% FLUSH
10.0000 mL | INTRAVENOUS | Status: DC | PRN
Start: 2023-10-22 — End: 2023-10-22
  Administered 2023-10-22: 10 mL

## 2023-10-22 MED ORDER — HEPARIN SOD (PORK) LOCK FLUSH 100 UNIT/ML IV SOLN
500.0000 [IU] | Freq: Once | INTRAVENOUS | Status: AC | PRN
  Administered 2023-10-22: 500 [IU]

## 2023-10-22 MED ORDER — PEGFILGRASTIM-JMDB 6 MG/0.6ML ~~LOC~~ SOSY
6.0000 mg | PREFILLED_SYRINGE | Freq: Once | SUBCUTANEOUS | Status: AC
Start: 2023-10-22 — End: 2023-10-22
  Administered 2023-10-22: 6 mg via SUBCUTANEOUS
  Filled 2023-10-22: qty 0.6

## 2023-10-22 NOTE — Patient Instructions (Signed)
 [facility] [place] - A DEPT OF [facility]  Discharge Instructions: Thank you for choosing [facility] to provide your oncology and hematology care.  If you have a lab appointment with the Cancer Center - please note that after April 8th, 2024, all labs will be drawn in the cancer center.  You do not have to check in or register with the main entrance as you have in the past but will complete your check-in in the cancer center.  Wear comfortable clothing and clothing appropriate for easy access to any Portacath or PICC line.   We strive to give you quality time with your provider. You may need to reschedule your appointment if you arrive late (15 or more minutes).  Arriving late affects you and other patients whose appointments are after yours.  Also, if you miss three or more appointments without notifying the office, you may be dismissed from the clinic at the provider's discretion.      For prescription refill requests, have your pharmacy contact our office and allow 72 hours for refills to be completed.    Today you received the following chemotherapy and/or immunotherapy agents Pump stop      To help prevent nausea and vomiting after your treatment, we encourage you to take your nausea medication as directed.  BELOW ARE SYMPTOMS THAT SHOULD BE REPORTED IMMEDIATELY: *FEVER GREATER THAN 100.4 F (38 C) OR HIGHER *CHILLS OR SWEATING *NAUSEA AND VOMITING THAT IS NOT CONTROLLED WITH YOUR NAUSEA MEDICATION *UNUSUAL SHORTNESS OF BREATH *UNUSUAL BRUISING OR BLEEDING *URINARY PROBLEMS (pain or burning when urinating, or frequent urination) *BOWEL PROBLEMS (unusual diarrhea, constipation, pain near the anus) TENDERNESS IN MOUTH AND THROAT WITH OR WITHOUT PRESENCE OF ULCERS (sore throat, sores in mouth, or a toothache) UNUSUAL RASH, SWELLING OR PAIN  UNUSUAL VAGINAL DISCHARGE OR ITCHING   Items with * indicate a potential emergency and should be followed up  as soon as possible or go to the Emergency Department if any problems should occur.  Please show the CHEMOTHERAPY ALERT CARD or IMMUNOTHERAPY ALERT CARD at check-in to the Emergency Department and triage nurse.  Should you have questions after your visit or need to cancel or reschedule your appointment, please contact [facility] CANCER CTR  - A DEPT OF [facility] [phone]  and follow the prompts.  Office hours are 8:00 a.m. to 4:30 p.m. Monday - Friday. Please note that voicemails left after 4:00 p.m. may not be returned until the following business day.  We are closed weekends and major holidays. You have access to a nurse at all times for urgent questions. Please call the main number to the clinic [phone] and follow the prompts.  For any non-urgent questions, you may also contact your provider using MyChart. We now offer e-Visits for anyone 16 and older to request care online for non-urgent symptoms. For details visit [email or web address].   Also download the MyChart app! Go to the app store, search "MyChart", open the app, select , and log in with your MyChart username and password.

## 2023-10-22 NOTE — Progress Notes (Signed)
 Patient presents today for 5FU pump stop and disconnection after 46 hour continous infusion.   5FU pump deaccessed.  Patients port flushed without difficulty.  Good blood return noted with no bruising or swelling noted at site.  Needle removed intact.  Band aid applied.  Fulphila  administration without incident; injection site WNL; see MAR for injection details.  Patient tolerated procedure well and without incident.  VSS with discharge and left in satisfactory condition via wheelchair with no s/s of distress noted.

## 2023-10-24 ENCOUNTER — Encounter: Payer: Self-pay | Admitting: Hematology

## 2023-10-27 ENCOUNTER — Other Ambulatory Visit: Payer: Self-pay | Admitting: *Deleted

## 2023-10-27 MED ORDER — MISC. DEVICES MISC: 12 refills | Status: DC

## 2023-10-28 DIAGNOSIS — C189 Malignant neoplasm of colon, unspecified: Secondary | ICD-10-CM | POA: Diagnosis not present

## 2023-10-28 DIAGNOSIS — C787 Secondary malignant neoplasm of liver and intrahepatic bile duct: Secondary | ICD-10-CM | POA: Diagnosis not present

## 2023-10-28 DIAGNOSIS — Z933 Colostomy status: Secondary | ICD-10-CM | POA: Diagnosis not present

## 2023-10-30 ENCOUNTER — Other Ambulatory Visit: Payer: Self-pay | Admitting: Hematology

## 2023-10-30 DIAGNOSIS — E119 Type 2 diabetes mellitus without complications: Secondary | ICD-10-CM

## 2023-11-01 ENCOUNTER — Encounter: Payer: Self-pay | Admitting: Hematology

## 2023-11-03 ENCOUNTER — Encounter: Payer: Self-pay | Admitting: Hematology

## 2023-11-03 ENCOUNTER — Inpatient Hospital Stay

## 2023-11-03 ENCOUNTER — Inpatient Hospital Stay: Admitting: Hematology

## 2023-11-03 ENCOUNTER — Inpatient Hospital Stay: Attending: Hematology

## 2023-11-03 VITALS — BP 141/66 | HR 70 | Temp 98.5°F | Resp 18 | Wt 170.0 lb

## 2023-11-03 VITALS — BP 139/71 | HR 84 | Temp 98.9°F | Resp 20

## 2023-11-03 DIAGNOSIS — Z79899 Other long term (current) drug therapy: Secondary | ICD-10-CM | POA: Insufficient documentation

## 2023-11-03 DIAGNOSIS — R748 Abnormal levels of other serum enzymes: Secondary | ICD-10-CM | POA: Insufficient documentation

## 2023-11-03 DIAGNOSIS — E1142 Type 2 diabetes mellitus with diabetic polyneuropathy: Secondary | ICD-10-CM | POA: Diagnosis not present

## 2023-11-03 DIAGNOSIS — C187 Malignant neoplasm of sigmoid colon: Secondary | ICD-10-CM | POA: Insufficient documentation

## 2023-11-03 DIAGNOSIS — Z801 Family history of malignant neoplasm of trachea, bronchus and lung: Secondary | ICD-10-CM | POA: Diagnosis not present

## 2023-11-03 DIAGNOSIS — I10 Essential (primary) hypertension: Secondary | ICD-10-CM | POA: Insufficient documentation

## 2023-11-03 DIAGNOSIS — C787 Secondary malignant neoplasm of liver and intrahepatic bile duct: Secondary | ICD-10-CM | POA: Insufficient documentation

## 2023-11-03 DIAGNOSIS — C20 Malignant neoplasm of rectum: Secondary | ICD-10-CM | POA: Diagnosis not present

## 2023-11-03 DIAGNOSIS — C189 Malignant neoplasm of colon, unspecified: Secondary | ICD-10-CM

## 2023-11-03 DIAGNOSIS — D509 Iron deficiency anemia, unspecified: Secondary | ICD-10-CM | POA: Insufficient documentation

## 2023-11-03 DIAGNOSIS — Z933 Colostomy status: Secondary | ICD-10-CM | POA: Diagnosis not present

## 2023-11-03 DIAGNOSIS — Z809 Family history of malignant neoplasm, unspecified: Secondary | ICD-10-CM | POA: Diagnosis not present

## 2023-11-03 DIAGNOSIS — E876 Hypokalemia: Secondary | ICD-10-CM | POA: Insufficient documentation

## 2023-11-03 DIAGNOSIS — Z5111 Encounter for antineoplastic chemotherapy: Secondary | ICD-10-CM | POA: Diagnosis present

## 2023-11-03 DIAGNOSIS — Z95828 Presence of other vascular implants and grafts: Secondary | ICD-10-CM

## 2023-11-03 DIAGNOSIS — Z87891 Personal history of nicotine dependence: Secondary | ICD-10-CM | POA: Insufficient documentation

## 2023-11-03 LAB — CBC WITH DIFFERENTIAL/PLATELET
Abs Immature Granulocytes: 0.09 10*3/uL — ABNORMAL HIGH (ref 0.00–0.07)
Basophils Absolute: 0.1 10*3/uL (ref 0.0–0.1)
Basophils Relative: 1 %
Eosinophils Absolute: 0.2 10*3/uL (ref 0.0–0.5)
Eosinophils Relative: 2 %
HCT: 36.2 % (ref 36.0–46.0)
Hemoglobin: 12 g/dL (ref 12.0–15.0)
Immature Granulocytes: 1 %
Lymphocytes Relative: 16 %
Lymphs Abs: 1.7 10*3/uL (ref 0.7–4.0)
MCH: 31.7 pg (ref 26.0–34.0)
MCHC: 33.1 g/dL (ref 30.0–36.0)
MCV: 95.8 fL (ref 80.0–100.0)
Monocytes Absolute: 0.9 10*3/uL (ref 0.1–1.0)
Monocytes Relative: 9 %
Neutro Abs: 7.4 10*3/uL (ref 1.7–7.7)
Neutrophils Relative %: 71 %
Platelets: 106 10*3/uL — ABNORMAL LOW (ref 150–400)
RBC: 3.78 MIL/uL — ABNORMAL LOW (ref 3.87–5.11)
RDW: 18.7 % — ABNORMAL HIGH (ref 11.5–15.5)
WBC: 10.3 10*3/uL (ref 4.0–10.5)
nRBC: 0 % (ref 0.0–0.2)

## 2023-11-03 LAB — COMPREHENSIVE METABOLIC PANEL WITH GFR
ALT: 36 U/L (ref 0–44)
AST: 49 U/L — ABNORMAL HIGH (ref 15–41)
Albumin: 3.8 g/dL (ref 3.5–5.0)
Alkaline Phosphatase: 240 U/L — ABNORMAL HIGH (ref 38–126)
Anion gap: 13 (ref 5–15)
BUN: 12 mg/dL (ref 8–23)
CO2: 23 mmol/L (ref 22–32)
Calcium: 9.2 mg/dL (ref 8.9–10.3)
Chloride: 98 mmol/L (ref 98–111)
Creatinine, Ser: 0.89 mg/dL (ref 0.44–1.00)
GFR, Estimated: >60 mL/min (ref >60)
Glucose, Bld: 160 mg/dL — ABNORMAL HIGH (ref 70–99)
Potassium: 3.2 mmol/L — ABNORMAL LOW (ref 3.5–5.1)
Sodium: 134 mmol/L — ABNORMAL LOW (ref 135–145)
Total Bilirubin: 1.3 mg/dL — ABNORMAL HIGH (ref 0.0–1.2)
Total Protein: 7.4 g/dL (ref 6.5–8.1)

## 2023-11-03 LAB — MAGNESIUM: Magnesium: 2.2 mg/dL (ref 1.7–2.4)

## 2023-11-03 MED ORDER — SODIUM CHLORIDE 0.9 % IV SOLN
320.0000 mg/m2 | Freq: Once | INTRAVENOUS | Status: AC
  Administered 2023-11-03: 608 mg via INTRAVENOUS
  Filled 2023-11-03: qty 30.4

## 2023-11-03 MED ORDER — BEVACIZUMAB-AWWB CHEMO INJECTION 400 MG/16ML
5.0000 mg/kg | Freq: Once | INTRAVENOUS | Status: AC
  Administered 2023-11-03: 400 mg via INTRAVENOUS
  Filled 2023-11-03: qty 16

## 2023-11-03 MED ORDER — POTASSIUM CHLORIDE CRYS ER 20 MEQ PO TBCR
40.0000 meq | EXTENDED_RELEASE_TABLET | Freq: Once | ORAL | Status: AC
  Administered 2023-11-03: 40 meq via ORAL
  Filled 2023-11-03: qty 2

## 2023-11-03 MED ORDER — SODIUM CHLORIDE FLUSH 0.9 % IV SOLN
10.0000 mL | Freq: Once | INTRAVENOUS | Status: AC
  Administered 2023-11-03: 10 mL via INTRAVENOUS
  Filled 2023-11-03: qty 10

## 2023-11-03 MED ORDER — DEXAMETHASONE SODIUM PHOSPHATE 10 MG/ML IJ SOLN
10.0000 mg | Freq: Once | INTRAMUSCULAR | Status: AC
  Administered 2023-11-03: 10 mg via INTRAVENOUS
  Filled 2023-11-03: qty 1

## 2023-11-03 MED ORDER — SODIUM CHLORIDE 0.9 % IV SOLN
144.0000 mg/m2 | Freq: Once | INTRAVENOUS | Status: AC
  Administered 2023-11-03: 300 mg via INTRAVENOUS
  Filled 2023-11-03: qty 15

## 2023-11-03 MED ORDER — SODIUM CHLORIDE 0.9 % IV SOLN: INTRAVENOUS | Status: DC

## 2023-11-03 MED ORDER — ATROPINE SULFATE 1 MG/ML IV SOLN
0.5000 mg | Freq: Once | INTRAVENOUS | Status: AC
Start: 2023-11-03 — End: 2023-11-03
  Administered 2023-11-03: 0.5 mg via INTRAVENOUS
  Filled 2023-11-03: qty 1

## 2023-11-03 MED ORDER — PALONOSETRON HCL INJECTION 0.25 MG/5ML
0.2500 mg | Freq: Once | INTRAVENOUS | Status: AC
  Administered 2023-11-03: 0.25 mg via INTRAVENOUS
  Filled 2023-11-03: qty 5

## 2023-11-03 MED ORDER — SODIUM CHLORIDE 0.9 % IV SOLN
1920.0000 mg/m2 | INTRAVENOUS | Status: DC
  Administered 2023-11-03: 3500 mg via INTRAVENOUS
  Filled 2023-11-03: qty 70

## 2023-11-03 MED ORDER — FLUOROURACIL CHEMO INJECTION 2.5 GM/50ML
320.0000 mg/m2 | Freq: Once | INTRAVENOUS | Status: AC
  Administered 2023-11-03: 600 mg via INTRAVENOUS
  Filled 2023-11-03: qty 12

## 2023-11-03 NOTE — Progress Notes (Signed)
 Patient tolerated chemotherapy with no complaints voiced.  Side effects with management reviewed with understanding verbalized.  Port site clean and dry with no bruising or swelling noted at site.  Good blood return noted before and after administration of chemotherapy.  Chemo pump connected with no alarms noted.  Patient left in satisfactory condition with VSS and no s/s of distress noted.

## 2023-11-03 NOTE — Patient Instructions (Signed)
 [facility] [place] - A DEPT OF [facility]  Discharge Instructions: Thank you for choosing [facility] to provide your oncology and hematology care.  If you have a lab appointment with the Cancer Center - please note that after April 8th, 2024, all labs will be drawn in the cancer center.  You do not have to check in or register with the main entrance as you have in the past but will complete your check-in in the cancer center.  Wear comfortable clothing and clothing appropriate for easy access to any Portacath or PICC line.   We strive to give you quality time with your provider. You may need to reschedule your appointment if you arrive late (15 or more minutes).  Arriving late affects you and other patients whose appointments are after yours.  Also, if you miss three or more appointments without notifying the office, you may be dismissed from the clinic at the provider's discretion.      For prescription refill requests, have your pharmacy contact our office and allow 72 hours for refills to be completed.    Today you received the following chemotherapy and/or immunotherapy agents MVASI , Irinotecan  Adruicil      To help prevent nausea and vomiting after your treatment, we encourage you to take your nausea medication as directed.  BELOW ARE SYMPTOMS THAT SHOULD BE REPORTED IMMEDIATELY: *FEVER GREATER THAN 100.4 F (38 C) OR HIGHER *CHILLS OR SWEATING *NAUSEA AND VOMITING THAT IS NOT CONTROLLED WITH YOUR NAUSEA MEDICATION *UNUSUAL SHORTNESS OF BREATH *UNUSUAL BRUISING OR BLEEDING *URINARY PROBLEMS (pain or burning when urinating, or frequent urination) *BOWEL PROBLEMS (unusual diarrhea, constipation, pain near the anus) TENDERNESS IN MOUTH AND THROAT WITH OR WITHOUT PRESENCE OF ULCERS (sore throat, sores in mouth, or a toothache) UNUSUAL RASH, SWELLING OR PAIN  UNUSUAL VAGINAL DISCHARGE OR ITCHING   Items with * indicate a potential emergency and  should be followed up as soon as possible or go to the Emergency Department if any problems should occur.  Please show the CHEMOTHERAPY ALERT CARD or IMMUNOTHERAPY ALERT CARD at check-in to the Emergency Department and triage nurse.  Should you have questions after your visit or need to cancel or reschedule your appointment, please contact [facility] CANCER CTR [place] - A DEPT OF [facility] [phone]  and follow the prompts.  Office hours are 8:00 a.m. to 4:30 p.m. Monday - Friday. Please note that voicemails left after 4:00 p.m. may not be returned until the following business day.  We are closed weekends and major holidays. You have access to a nurse at all times for urgent questions. Please call the main number to the clinic [phone] and follow the prompts.  For any non-urgent questions, you may also contact your provider using MyChart. We now offer e-Visits for anyone 30 and older to request care online for non-urgent symptoms. For details visit [email or web address].   Also download the MyChart app! Go to the app store, search "MyChart", open the app, select [place], and log in with your MyChart username and password.

## 2023-11-04 LAB — CEA: CEA: 9.3 ng/mL — ABNORMAL HIGH (ref 0.0–4.7)

## 2023-11-05 ENCOUNTER — Inpatient Hospital Stay

## 2023-11-05 VITALS — BP 140/74 | HR 69 | Temp 97.2°F | Resp 18

## 2023-11-05 DIAGNOSIS — Z5111 Encounter for antineoplastic chemotherapy: Secondary | ICD-10-CM | POA: Diagnosis not present

## 2023-11-05 DIAGNOSIS — C189 Malignant neoplasm of colon, unspecified: Secondary | ICD-10-CM

## 2023-11-05 MED ORDER — HEPARIN SOD (PORK) LOCK FLUSH 100 UNIT/ML IV SOLN
500.0000 [IU] | Freq: Once | INTRAVENOUS | Status: AC | PRN
  Administered 2023-11-05: 500 [IU]

## 2023-11-05 MED ORDER — SODIUM CHLORIDE 0.9% FLUSH
10.0000 mL | INTRAVENOUS | Status: DC | PRN
Start: 2023-11-05 — End: 2023-11-05
  Administered 2023-11-05: 10 mL

## 2023-11-05 MED ORDER — PEGFILGRASTIM-JMDB 6 MG/0.6ML ~~LOC~~ SOSY
6.0000 mg | PREFILLED_SYRINGE | Freq: Once | SUBCUTANEOUS | Status: AC
  Administered 2023-11-05: 6 mg via SUBCUTANEOUS
  Filled 2023-11-05: qty 0.6

## 2023-11-05 NOTE — Progress Notes (Signed)
 Patient tolerated injection with no complaints voiced. Site clean and dry with no bruising or swelling noted at site. See MAR for details.  Port flushed with good blood return noted. No bruising or swelling at site. Bandaid applied and patient discharged in satisfactory condition. VVS stable with no signs or symptoms of distressed noted.

## 2023-11-05 NOTE — Patient Instructions (Signed)
 CH [facility] CTR [place] - A DEPT OF [facility]  Discharge Instructions: Thank you for choosing [facility] to provide your oncology and hematology care.  If you have a lab appointment with the Cancer Center - please note that after April 8th, 2024, all labs will be drawn in the cancer center.  You do not have to check in or register with the main entrance as you have in the past but will complete your check-in in the cancer center.  Wear comfortable clothing and clothing appropriate for easy access to any Portacath or PICC line.   We strive to give you quality time with your provider. You may need to reschedule your appointment if you arrive late (15 or more minutes).  Arriving late affects you and other patients whose appointments are after yours.  Also, if you miss three or more appointments without notifying the office, you may be dismissed from the clinic at the provider's discretion.      For prescription refill requests, have your pharmacy contact our office and allow 72 hours for refills to be completed.    Today you received the following Chemo pump disconnected, and pegfilgrastim , return as scheduled.   To help prevent nausea and vomiting after your treatment, we encourage you to take your nausea medication as directed.  BELOW ARE SYMPTOMS THAT SHOULD BE REPORTED IMMEDIATELY: *FEVER GREATER THAN 100.4 F (38 C) OR HIGHER *CHILLS OR SWEATING *NAUSEA AND VOMITING THAT IS NOT CONTROLLED WITH YOUR NAUSEA MEDICATION *UNUSUAL SHORTNESS OF BREATH *UNUSUAL BRUISING OR BLEEDING *URINARY PROBLEMS (pain or burning when urinating, or frequent urination) *BOWEL PROBLEMS (unusual diarrhea, constipation, pain near the anus) TENDERNESS IN MOUTH AND THROAT WITH OR WITHOUT PRESENCE OF ULCERS (sore throat, sores in mouth, or a toothache) UNUSUAL RASH, SWELLING OR PAIN  UNUSUAL VAGINAL DISCHARGE OR ITCHING   Items with * indicate a potential emergency and should be  followed up as soon as possible or go to the Emergency Department if any problems should occur.  Please show the CHEMOTHERAPY ALERT CARD or IMMUNOTHERAPY ALERT CARD at check-in to the Emergency Department and triage nurse.  Should you have questions after your visit or need to cancel or reschedule your appointment, please contact [facility] CANCER CTR  - A DEPT OF [facility] [phone]  and follow the prompts.  Office hours are 8:00 a.m. to 4:30 p.m. Monday - Friday. Please note that voicemails left after 4:00 p.m. may not be returned until the following business day.  We are closed weekends and major holidays. You have access to a nurse at all times for urgent questions. Please call the main number to the clinic [phone] and follow the prompts.  For any non-urgent questions, you may also contact your provider using MyChart. We now offer e-Visits for anyone 25 and older to request care online for non-urgent symptoms. For details visit [email or web address].   Also download the MyChart app! Go to the app store, search "MyChart", open the app, select [place], and log in with your MyChart username and password.

## 2023-11-17 ENCOUNTER — Inpatient Hospital Stay

## 2023-11-17 ENCOUNTER — Inpatient Hospital Stay (HOSPITAL_BASED_OUTPATIENT_CLINIC_OR_DEPARTMENT_OTHER): Admitting: Hematology

## 2023-11-17 VITALS — BP 132/71 | HR 82 | Temp 98.3°F | Resp 18

## 2023-11-17 VITALS — BP 140/67

## 2023-11-17 DIAGNOSIS — C787 Secondary malignant neoplasm of liver and intrahepatic bile duct: Secondary | ICD-10-CM

## 2023-11-17 DIAGNOSIS — C189 Malignant neoplasm of colon, unspecified: Secondary | ICD-10-CM

## 2023-11-17 DIAGNOSIS — E876 Hypokalemia: Secondary | ICD-10-CM

## 2023-11-17 DIAGNOSIS — Z5111 Encounter for antineoplastic chemotherapy: Secondary | ICD-10-CM | POA: Diagnosis not present

## 2023-11-17 LAB — CBC WITH DIFFERENTIAL/PLATELET
Abs Immature Granulocytes: 0.17 10*3/uL — ABNORMAL HIGH (ref 0.00–0.07)
Basophils Absolute: 0.1 10*3/uL (ref 0.0–0.1)
Basophils Relative: 1 %
Eosinophils Absolute: 0.3 10*3/uL (ref 0.0–0.5)
Eosinophils Relative: 3 %
HCT: 36.9 % (ref 36.0–46.0)
Hemoglobin: 11.3 g/dL — ABNORMAL LOW (ref 12.0–15.0)
Immature Granulocytes: 2 %
Lymphocytes Relative: 16 %
Lymphs Abs: 1.5 10*3/uL (ref 0.7–4.0)
MCH: 29.4 pg (ref 26.0–34.0)
MCHC: 30.6 g/dL (ref 30.0–36.0)
MCV: 96.1 fL (ref 80.0–100.0)
Monocytes Absolute: 0.8 10*3/uL (ref 0.1–1.0)
Monocytes Relative: 8 %
Neutro Abs: 6.8 10*3/uL (ref 1.7–7.7)
Neutrophils Relative %: 70 %
Platelets: 102 10*3/uL — ABNORMAL LOW (ref 150–400)
RBC: 3.84 MIL/uL — ABNORMAL LOW (ref 3.87–5.11)
RDW: 18.8 % — ABNORMAL HIGH (ref 11.5–15.5)
WBC: 9.6 10*3/uL (ref 4.0–10.5)
nRBC: 0 % (ref 0.0–0.2)

## 2023-11-17 LAB — COMPREHENSIVE METABOLIC PANEL WITH GFR
ALT: 34 U/L (ref 0–44)
AST: 44 U/L — ABNORMAL HIGH (ref 15–41)
Albumin: 3.6 g/dL (ref 3.5–5.0)
Alkaline Phosphatase: 193 U/L — ABNORMAL HIGH (ref 38–126)
Anion gap: 8 (ref 5–15)
BUN: 9 mg/dL (ref 8–23)
CO2: 24 mmol/L (ref 22–32)
Calcium: 9.1 mg/dL (ref 8.9–10.3)
Chloride: 101 mmol/L (ref 98–111)
Creatinine, Ser: 0.87 mg/dL (ref 0.44–1.00)
GFR, Estimated: >60 mL/min (ref >60)
Glucose, Bld: 151 mg/dL — ABNORMAL HIGH (ref 70–99)
Potassium: 3.4 mmol/L — ABNORMAL LOW (ref 3.5–5.1)
Sodium: 133 mmol/L — ABNORMAL LOW (ref 135–145)
Total Bilirubin: 0.8 mg/dL (ref 0.0–1.2)
Total Protein: 7.1 g/dL (ref 6.5–8.1)

## 2023-11-17 LAB — URINALYSIS, DIPSTICK ONLY
Bilirubin Urine: NEGATIVE
Glucose, UA: NEGATIVE mg/dL
Hgb urine dipstick: NEGATIVE
Ketones, ur: NEGATIVE mg/dL
Leukocytes,Ua: NEGATIVE
Nitrite: NEGATIVE
Protein, ur: 30 mg/dL — AB
Specific Gravity, Urine: 1.015 (ref 1.005–1.030)
pH: 5 (ref 5.0–8.0)

## 2023-11-17 LAB — MAGNESIUM: Magnesium: 2 mg/dL (ref 1.7–2.4)

## 2023-11-17 MED ORDER — SODIUM CHLORIDE 0.9% FLUSH
10.0000 mL | Freq: Once | INTRAVENOUS | Status: AC
  Administered 2023-11-17: 10 mL via INTRAVENOUS

## 2023-11-17 MED ORDER — SODIUM CHLORIDE 0.9 % IV SOLN
1920.0000 mg/m2 | INTRAVENOUS | Status: DC
  Administered 2023-11-17: 3500 mg via INTRAVENOUS
  Filled 2023-11-17: qty 70

## 2023-11-17 MED ORDER — FLUOROURACIL CHEMO INJECTION 2.5 GM/50ML
320.0000 mg/m2 | Freq: Once | INTRAVENOUS | Status: AC
Start: 2023-11-17 — End: 2023-11-17
  Administered 2023-11-17: 600 mg via INTRAVENOUS
  Filled 2023-11-17: qty 12

## 2023-11-17 MED ORDER — SODIUM CHLORIDE 0.9 % IV SOLN: INTRAVENOUS | Status: DC

## 2023-11-17 MED ORDER — SODIUM CHLORIDE 0.9 % IV SOLN
5.0000 mg/kg | Freq: Once | INTRAVENOUS | Status: AC
  Administered 2023-11-17: 400 mg via INTRAVENOUS
  Filled 2023-11-17: qty 16

## 2023-11-17 MED ORDER — DEXAMETHASONE SODIUM PHOSPHATE 10 MG/ML IJ SOLN
10.0000 mg | Freq: Once | INTRAMUSCULAR | Status: AC
  Administered 2023-11-17: 10 mg via INTRAVENOUS
  Filled 2023-11-17: qty 1

## 2023-11-17 MED ORDER — LEUCOVORIN CALCIUM INJECTION 350 MG
320.0000 mg/m2 | Freq: Once | INTRAMUSCULAR | Status: AC
  Administered 2023-11-17: 608 mg via INTRAVENOUS
  Filled 2023-11-17: qty 30.4

## 2023-11-17 MED ORDER — POTASSIUM CHLORIDE CRYS ER 20 MEQ PO TBCR
40.0000 meq | EXTENDED_RELEASE_TABLET | Freq: Once | ORAL | Status: AC
  Administered 2023-11-17: 40 meq via ORAL
  Filled 2023-11-17: qty 2

## 2023-11-17 MED ORDER — ATROPINE SULFATE 1 MG/ML IV SOLN
0.5000 mg | Freq: Once | INTRAVENOUS | Status: AC
  Administered 2023-11-17: 0.5 mg via INTRAVENOUS
  Filled 2023-11-17: qty 1

## 2023-11-17 MED ORDER — PALONOSETRON HCL INJECTION 0.25 MG/5ML
0.2500 mg | Freq: Once | INTRAVENOUS | Status: AC
  Administered 2023-11-17: 0.25 mg via INTRAVENOUS
  Filled 2023-11-17: qty 5

## 2023-11-17 MED ORDER — IRINOTECAN HCL CHEMO INJECTION 100 MG/5ML
144.0000 mg/m2 | Freq: Once | INTRAVENOUS | Status: AC
  Administered 2023-11-17: 300 mg via INTRAVENOUS
  Filled 2023-11-17: qty 15

## 2023-11-17 NOTE — Progress Notes (Signed)
 Patient has been examined by Dr. Ellin Saba. Vital signs and labs have been reviewed by MD - ANC, Creatinine, LFTs, hemoglobin, and platelets are within treatment parameters per M.D. - pt may proceed with treatment.  Primary RN and pharmacy notified.

## 2023-11-17 NOTE — Patient Instructions (Signed)

## 2023-11-17 NOTE — Patient Instructions (Signed)
 [facility] - A DEPT OF [facility]  Discharge Instructions: Thank you for choosing [facility] to provide your oncology and hematology care.  If you have a lab appointment with the Cancer Center - please note that after April 8th, 2024, all labs will be drawn in the cancer center.  You do not have to check in or register with the main entrance as you have in the past but will complete your check-in in the cancer center.  Wear comfortable clothing and clothing appropriate for easy access to any Portacath or PICC line.   We strive to give you quality time with your provider. You may need to reschedule your appointment if you arrive late (15 or more minutes).  Arriving late affects you and other patients whose appointments are after yours.  Also, if you miss three or more appointments without notifying the office, you may be dismissed from the clinic at the provider's discretion.      For prescription refill requests, have your pharmacy contact our office and allow 72 hours for refills to be completed.    Today you received the following chemotherapy and/or immunotherapy agents MVASI/Irinotecan/Leucovorin/5FU.  Bevacizumab Injection What is this medication? BEVACIZUMAB (be va SIZ yoo mab) treats some types of cancer. It works by blocking a protein that causes cancer cells to grow and multiply. This helps to slow or stop the spread of cancer cells. It is a monoclonal antibody. This medicine may be used for other purposes; ask your health care provider or pharmacist if you have questions. COMMON BRAND NAME(S): Alymsys, Avastin, MVASI, [place] Lao What should I tell my care team before I take this medication? They need to know if you have any of these conditions: Blood clots Coughing up blood Having or recent surgery Heart failure High blood pressure History of a connection between 2 or more body parts that do not usually connect  (fistula) History of a tear in your stomach or intestines Protein in your urine An unusual or allergic reaction to bevacizumab, other medications, foods, dyes, or preservatives Pregnant or trying to get pregnant Breast-feeding How should I use this medication? This medication is injected into a vein. It is given by your care team in a hospital or clinic setting. Talk to your care team the use of this medication in children. Special care may be needed. Overdosage: If you think you have taken too much of this medicine contact a poison control center or emergency room at once. NOTE: This medicine is only for you. Do not share this medicine with others. What if I miss a dose? Keep appointments for follow-up doses. It is important not to miss your dose. Call your care team if you are unable to keep an appointment. What may interact with this medication? Interactions are not expected. This list may not describe all possible interactions. Give your health care provider a list of all the medicines, herbs, non-prescription drugs, or dietary supplements you use. Also tell them if you smoke, drink alcohol, or use illegal drugs. Some items may interact with your medicine. What should I watch for while using this medication? Your condition will be monitored carefully while you are receiving this medication. You may need blood work while taking this medication. This medication may make you feel generally unwell. This is not uncommon as chemotherapy can affect healthy cells as well as cancer cells. Report any side effects. Continue your course of treatment even though you feel  ill unless your care team tells you to stop. This medication may increase your risk to bruise or bleed. Call your care team if you notice any unusual bleeding. Before having surgery, talk to your care team to make sure it is ok. This medication can increase the risk of poor healing of your surgical site or wound. You will need to stop  this medication for 28 days before surgery. After surgery, wait at least 28 days before restarting this medication. Make sure the surgical site or wound is healed enough before restarting this medication. Talk to your care team if questions. Talk to your care team if you may be pregnant. Serious birth defects can occur if you take this medication during pregnancy and for 6 months after the last dose. Contraception is recommended while taking this medication and for 6 months after the last dose. Your care team can help you find the option that works for you. Do not breastfeed while taking this medication and for 6 months after the last dose. This medication can cause infertility. Talk to your care team if you are concerned about your fertility. What side effects may I notice from receiving this medication? Side effects that you should report to your care team as soon as possible: Allergic reactions--skin rash, itching, hives, swelling of the face, lips, tongue, or throat Bleeding--bloody or black, tar-like stools, vomiting blood or Arlone Lenhardt material that looks like coffee grounds, red or dark Serrita Lueth urine, small red or purple spots on skin, unusual bruising or bleeding Blood clot--pain, swelling, or warmth in the leg, shortness of breath, chest pain Heart attack--pain or tightness in the chest, shoulders, arms, or jaw, nausea, shortness of breath, cold or clammy skin, feeling faint or lightheaded Heart failure--shortness of breath, swelling of the ankles, feet, or hands, sudden weight gain, unusual weakness or fatigue Increase in blood pressure Infection--fever, chills, cough, sore throat, wounds that don't heal, pain or trouble when passing urine, general feeling of discomfort or being unwell Infusion reactions--chest pain, shortness of breath or trouble breathing, feeling faint or lightheaded Kidney injury--decrease in the amount of urine, swelling of the ankles, hands, or feet Stomach pain that is  severe, does not go away, or gets worse Stroke--sudden numbness or weakness of the face, arm, or leg, trouble speaking, confusion, trouble walking, loss of balance or coordination, dizziness, severe headache, change in vision Sudden and severe headache, confusion, change in vision, seizures, which may be signs of posterior reversible encephalopathy syndrome (PRES) Side effects that usually do not require medical attention (report to your care team if they continue or are bothersome): Back pain Change in taste Diarrhea Dry skin Increased tears Nosebleed This list may not describe all possible side effects. Call your doctor for medical advice about side effects. You may report side effects to FDA at 1-800-FDA-1088. Where should I keep my medication? This medication is given in a hospital or clinic. It will not be stored at home. NOTE: This sheet is a summary. It may not cover all possible information. If you have questions about this medicine, talk to your doctor, pharmacist, or health care provider.  2024 Elsevier/Gold Standard (2021-10-31 00:00:00)   Irinotecan Injection What is this medication? IRINOTECAN (ir in oh TEE kan) treats some types of cancer. It works by slowing down the growth of cancer cells. This medicine may be used for other purposes; ask your health care provider or pharmacist if you have questions. COMMON BRAND NAME(S): Camptosar What should I tell my care team  before I take this medication? They need to know if you have any of these conditions: Dehydration Diarrhea Infection, especially a viral infection, such as chickenpox, cold sores, herpes Liver disease Low blood cell levels (white cells, red cells, and platelets) Low levels of electrolytes, such as calcium, magnesium, or potassium in your blood Recent or ongoing radiation An unusual or allergic reaction to irinotecan, other medications, foods, dyes, or preservatives If you or your partner are pregnant or  trying to get pregnant Breast-feeding How should I use this medication? This medication is injected into a vein. It is given by your care team in a hospital or clinic setting. Talk to your care team about the use of this medication in children. Special care may be needed. Overdosage: If you think you have taken too much of this medicine contact a poison control center or emergency room at once. NOTE: This medicine is only for you. Do not share this medicine with others. What if I miss a dose? Keep appointments for follow-up doses. It is important not to miss your dose. Call your care team if you are unable to keep an appointment. What may interact with this medication? Do not take this medication with any of the following: Cobicistat Itraconazole This medication may also interact with the following: Certain antibiotics, such as clarithromycin, rifampin, rifabutin Certain antivirals for HIV or AIDS Certain medications for fungal infections, such as ketoconazole, posaconazole, voriconazole Certain medications for seizures, such as carbamazepine, phenobarbital, phenytoin Gemfibrozil Nefazodone St. John's wort This list may not describe all possible interactions. Give your health care provider a list of all the medicines, herbs, non-prescription drugs, or dietary supplements you use. Also tell them if you smoke, drink alcohol, or use illegal drugs. Some items may interact with your medicine. What should I watch for while using this medication? Your condition will be monitored carefully while you are receiving this medication. You may need blood work while taking this medication. This medication may make you feel generally unwell. This is not uncommon as chemotherapy can affect healthy cells as well as cancer cells. Report any side effects. Continue your course of treatment even though you feel ill unless your care team tells you to stop. This medication can cause serious side effects. To reduce  the risk, your care team may give you other medications to take before receiving this one. Be sure to follow the directions from your care team. This medication may affect your coordination, reaction time, or judgement. Do not drive or operate machinery until you know how this medication affects you. Sit up or stand slowly to reduce the risk of dizzy or fainting spells. Drinking alcohol with this medication can increase the risk of these side effects. This medication may increase your risk of getting an infection. Call your care team for advice if you get a fever, chills, sore throat, or other symptoms of a cold or flu. Do not treat yourself. Try to avoid being around people who are sick. Avoid taking medications that contain aspirin, acetaminophen, ibuprofen, naproxen, or ketoprofen unless instructed by your care team. These medications may hide a fever. This medication may increase your risk to bruise or bleed. Call your care team if you notice any unusual bleeding. Be careful brushing or flossing your teeth or using a toothpick because you may get an infection or bleed more easily. If you have any dental work done, tell your dentist you are receiving this medication. Talk to your care team if you or your  partner are pregnant or think either of you might be pregnant. This medication can cause serious birth defects if taken during pregnancy and for 6 months after the last dose. You will need a negative pregnancy test before starting this medication. Contraception is recommended while taking this medication and for 6 months after the last dose. Your care team can help you find the option that works for you. Do not father a child while taking this medication and for 3 months after the last dose. Use a condom for contraception during this time period. Do not breastfeed while taking this medication and for 7 days after the last dose. This medication may cause infertility. Talk to your care team if you are  concerned about your fertility. What side effects may I notice from receiving this medication? Side effects that you should report to your care team as soon as possible: Allergic reactions--skin rash, itching, hives, swelling of the face, lips, tongue, or throat Dry cough, shortness of breath or trouble breathing Increased saliva or tears, increased sweating, stomach cramping, diarrhea, small pupils, unusual weakness or fatigue, slow heartbeat Infection--fever, chills, cough, sore throat, wounds that don't heal, pain or trouble when passing urine, general feeling of discomfort or being unwell Kidney injury--decrease in the amount of urine, swelling of the ankles, hands, or feet Low red blood cell level--unusual weakness or fatigue, dizziness, headache, trouble breathing Severe or prolonged diarrhea Unusual bruising or bleeding Side effects that usually do not require medical attention (report to your care team if they continue or are bothersome): Constipation Diarrhea Hair loss Loss of appetite Nausea Stomach pain This list may not describe all possible side effects. Call your doctor for medical advice about side effects. You may report side effects to FDA at 1-800-FDA-1088. Where should I keep my medication? This medication is given in a hospital or clinic. It will not be stored at home. NOTE: This sheet is a summary. It may not cover all possible information. If you have questions about this medicine, talk to your doctor, pharmacist, or health care provider.  2024 Elsevier/Gold Standard (2021-10-27 00:00:00)   Leucovorin Injection What is this medication? LEUCOVORIN (loo koe VOR in) prevents side effects from certain medications, such as methotrexate. It works by increasing folate levels. This helps protect healthy cells in your body. It may also be used to treat anemia caused by low levels of folate. It can also be used with fluorouracil, a type of chemotherapy, to treat colorectal  cancer. It works by increasing the effects of fluorouracil in the body. This medicine may be used for other purposes; ask your health care provider or pharmacist if you have questions. What should I tell my care team before I take this medication? They need to know if you have any of these conditions: Anemia from low levels of vitamin B12 in the blood An unusual or allergic reaction to leucovorin, folic acid, other medications, foods, dyes, or preservatives Pregnant or trying to get pregnant Breastfeeding How should I use this medication? This medication is injected into a vein or a muscle. It is given by your care team in a hospital or clinic setting. Talk to your care team about the use of this medication in children. Special care may be needed. Overdosage: If you think you have taken too much of this medicine contact a poison control center or emergency room at once. NOTE: This medicine is only for you. Do not share this medicine with others. What if I miss a dose?  Keep appointments for follow-up doses. It is important not to miss your dose. Call your care team if you are unable to keep an appointment. What may interact with this medication? Capecitabine Fluorouracil Phenobarbital Phenytoin Primidone Trimethoprim;sulfamethoxazole This list may not describe all possible interactions. Give your health care provider a list of all the medicines, herbs, non-prescription drugs, or dietary supplements you use. Also tell them if you smoke, drink alcohol, or use illegal drugs. Some items may interact with your medicine. What should I watch for while using this medication? Your condition will be monitored carefully while you are receiving this medication. This medication may increase the side effects of 5-fluorouracil. Tell your care team if you have diarrhea or mouth sores that do not get better or that get worse. What side effects may I notice from receiving this medication? Side effects that  you should report to your care team as soon as possible: Allergic reactions--skin rash, itching, hives, swelling of the face, lips, tongue, or throat This list may not describe all possible side effects. Call your doctor for medical advice about side effects. You may report side effects to FDA at 1-800-FDA-1088. Where should I keep my medication? This medication is given in a hospital or clinic. It will not be stored at home. NOTE: This sheet is a summary. It may not cover all possible information. If you have questions about this medicine, talk to your doctor, pharmacist, or health care provider.  2024 Elsevier/Gold Standard (2021-11-18 00:00:00)   Fluorouracil Injection What is this medication? FLUOROURACIL (flure oh YOOR a sil) treats some types of cancer. It works by slowing down the growth of cancer cells. This medicine may be used for other purposes; ask your health care provider or pharmacist if you have questions. COMMON BRAND NAME(S): Adrucil What should I tell my care team before I take this medication? They need to know if you have any of these conditions: Blood disorders Dihydropyrimidine dehydrogenase (DPD) deficiency Infection, such as chickenpox, cold sores, herpes Kidney disease Liver disease Poor nutrition Recent or ongoing radiation therapy An unusual or allergic reaction to fluorouracil, other medications, foods, dyes, or preservatives If you or your partner are pregnant or trying to get pregnant Breast-feeding How should I use this medication? This medication is injected into a vein. It is administered by your care team in a hospital or clinic setting. Talk to your care team about the use of this medication in children. Special care may be needed. Overdosage: If you think you have taken too much of this medicine contact a poison control center or emergency room at once. NOTE: This medicine is only for you. Do not share this medicine with others. What if I miss a  dose? Keep appointments for follow-up doses. It is important not to miss your dose. Call your care team if you are unable to keep an appointment. What may interact with this medication? Do not take this medication with any of the following: Live virus vaccines This medication may also interact with the following: Medications that treat or prevent blood clots, such as warfarin, enoxaparin, dalteparin This list may not describe all possible interactions. Give your health care provider a list of all the medicines, herbs, non-prescription drugs, or dietary supplements you use. Also tell them if you smoke, drink alcohol, or use illegal drugs. Some items may interact with your medicine. What should I watch for while using this medication? Your condition will be monitored carefully while you are receiving this medication. This medication  may make you feel generally unwell. This is not uncommon as chemotherapy can affect healthy cells as well as cancer cells. Report any side effects. Continue your course of treatment even though you feel ill unless your care team tells you to stop. In some cases, you may be given additional medications to help with side effects. Follow all directions for their use. This medication may increase your risk of getting an infection. Call your care team for advice if you get a fever, chills, sore throat, or other symptoms of a cold or flu. Do not treat yourself. Try to avoid being around people who are sick. This medication may increase your risk to bruise or bleed. Call your care team if you notice any unusual bleeding. Be careful brushing or flossing your teeth or using a toothpick because you may get an infection or bleed more easily. If you have any dental work done, tell your dentist you are receiving this medication. Avoid taking medications that contain aspirin, acetaminophen, ibuprofen, naproxen, or ketoprofen unless instructed by your care team. These medications may hide  a fever. Do not treat diarrhea with over the counter products. Contact your care team if you have diarrhea that lasts more than 2 days or if it is severe and watery. This medication can make you more sensitive to the sun. Keep out of the sun. If you cannot avoid being in the sun, wear protective clothing and sunscreen. Do not use sun lamps, tanning beds, or tanning booths. Talk to your care team if you or your partner wish to become pregnant or think you might be pregnant. This medication can cause serious birth defects if taken during pregnancy and for 3 months after the last dose. A reliable form of contraception is recommended while taking this medication and for 3 months after the last dose. Talk to your care team about effective forms of contraception. Do not father a child while taking this medication and for 3 months after the last dose. Use a condom while having sex during this time period. Do not breastfeed while taking this medication. This medication may cause infertility. Talk to your care team if you are concerned about your fertility. What side effects may I notice from receiving this medication? Side effects that you should report to your care team as soon as possible: Allergic reactions--skin rash, itching, hives, swelling of the face, lips, tongue, or throat Heart attack--pain or tightness in the chest, shoulders, arms, or jaw, nausea, shortness of breath, cold or clammy skin, feeling faint or lightheaded Heart failure--shortness of breath, swelling of the ankles, feet, or hands, sudden weight gain, unusual weakness or fatigue Heart rhythm changes--fast or irregular heartbeat, dizziness, feeling faint or lightheaded, chest pain, trouble breathing High ammonia level--unusual weakness or fatigue, confusion, loss of appetite, nausea, vomiting, seizures Infection--fever, chills, cough, sore throat, wounds that don't heal, pain or trouble when passing urine, general feeling of discomfort or  being unwell Low red blood cell level--unusual weakness or fatigue, dizziness, headache, trouble breathing Pain, tingling, or numbness in the hands or feet, muscle weakness, change in vision, confusion or trouble speaking, loss of balance or coordination, trouble walking, seizures Redness, swelling, and blistering of the skin over hands and feet Severe or prolonged diarrhea Unusual bruising or bleeding Side effects that usually do not require medical attention (report to your care team if they continue or are bothersome): Dry skin Headache Increased tears Nausea Pain, redness, or swelling with sores inside the mouth or throat Sensitivity to  light Vomiting This list may not describe all possible side effects. Call your doctor for medical advice about side effects. You may report side effects to FDA at 1-800-FDA-1088. Where should I keep my medication? This medication is given in a hospital or clinic. It will not be stored at home. NOTE: This sheet is a summary. It may not cover all possible information. If you have questions about this medicine, talk to your doctor, pharmacist, or health care provider.  2024 Elsevier/Gold Standard (2021-10-21 00:00:00)       To help prevent nausea and vomiting after your treatment, we encourage you to take your nausea medication as directed.  BELOW ARE SYMPTOMS THAT SHOULD BE REPORTED IMMEDIATELY: *FEVER GREATER THAN 100.4 F (38 C) OR HIGHER *CHILLS OR SWEATING *NAUSEA AND VOMITING THAT IS NOT CONTROLLED WITH YOUR NAUSEA MEDICATION *UNUSUAL SHORTNESS OF BREATH *UNUSUAL BRUISING OR BLEEDING *URINARY PROBLEMS (pain or burning when urinating, or frequent urination) *BOWEL PROBLEMS (unusual diarrhea, constipation, pain near the anus) TENDERNESS IN MOUTH AND THROAT WITH OR WITHOUT PRESENCE OF ULCERS (sore throat, sores in mouth, or a toothache) UNUSUAL RASH, SWELLING OR PAIN  UNUSUAL VAGINAL DISCHARGE OR ITCHING   Items with * indicate a potential  emergency and should be followed up as soon as possible or go to the Emergency Department if any problems should occur.  Please show the CHEMOTHERAPY ALERT CARD or IMMUNOTHERAPY ALERT CARD at check-in to the Emergency Department and triage nurse.  Should you have questions after your visit or need to cancel or reschedule your appointment, please contact [facility] [place] - A DEPT OF [facility] [phone]  and follow the prompts.  Office hours are 8:00 a.m. to 4:30 p.m. Monday - Friday. Please note that voicemails left after 4:00 p.m. may not be returned until the following business day.  We are closed weekends and major holidays. You have access to a nurse at all times for urgent questions. Please call the main number to the clinic [phone] and follow the prompts.  For any non-urgent questions, you may also contact your provider using MyChart. We now offer e-Visits for anyone 73 and older to request care online for non-urgent symptoms. For details visit [email or web address].   Also download the MyChart app! Go to the app store, search "MyChart", open the app, select [place], and log in with your MyChart username and password.

## 2023-11-17 NOTE — Progress Notes (Signed)
 Patient presents today for chemotherapy infusion. Patient is in satisfactory condition with no new complaints voiced.  Vital signs are stable.  Labs reviewed by Dr. Cheree Cords during the office visit and all labs are within treatment parameters.  Potassium today is 3.4.  We will give Klor Con 40 mEq PO x one dose today per standing orders by Dr. Cheree Cords.  We will proceed with treatment per MD orders.   Patient tolerated treatment well with no complaints voiced.  Home infusion 5FU pump connected.  Patient left ambulatory in stable condition.  Vital signs stable at discharge.  Follow up as scheduled.

## 2023-11-17 NOTE — Progress Notes (Signed)
 [facility] [address] [place] [address]    Clinic Day:  11/17/2023  Referring physician: Paulett Boros, MD  Patient Care Team: Paulett Boros, MD as PCP - General (Hematology) Gerhard Knuckles, RN as Oncology Nurse Navigator (Oncology)   ASSESSMENT & PLAN:   Assessment: 1.  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 -CT chest on 05/02/2020 with no evidence of pulmonary metastatic disease. -CTAP on 05/07/2020 with multiple lesions throughout the liver, largest in the right lobe measuring 3.9 cm, lateral segment left lobe measuring 2.6 cm and inferior right lobe confluent lesion 4.2 x 3.7 cm.  No lymphadenopathy. -CEA on 05/02/2020-60.2. -FOLFOX and bevacizumab  started on 06/04/2020. 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 - FOLFOX with bevacizumab  from 03/10/2023 through 05/24/2023 with progression. - FOLFIRI and bevacizumab  started on 06/09/2023   2.  Iron deficiency anemia: -Feraheme  on 04/30/2020 and 05/06/2020.   3.  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 -Smoked for 3 to 4 years and quit. -Mother had cancer, type unknown.  Maternal uncle had lung cancer.  Maternal aunt had lung cancer.    Plan: 1.  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  Similar stranding and nodularity throughout the peritoneum and omentum without discrete measurable nodule. - She is tolerating FOLFIRI and bevacizumab  reasonably well.  She has diarrhea for couple of days after each treatment and takes  Imodium which helps. - Reviewed labs today: Alk phos elevated at 193 and AST 44.  Rest of LFTs are normal.  CBC grossly normal.  CEA is 9.3 and downtrending.  UA shows protein 30. - Recommend continuing dose reduced FOLFIRI and bevacizumab  every 2 weeks.  RTC 4 weeks for follow-up.  Repeat CT CAP prior to next visit.  Will consider maintenance 5-FU and bevacizumab  based on response on the CT scan.     2.  Hypertension: - Continue Norvasc  10 mg daily and triamterene /HCTZ daily.  Blood pressure is 140/67.   3.  Severe hypokalemia: - Continue K-Dur 40 mill equivalents daily.  Potassium is 3.4.   4.  Peripheral neuropathy: - Continue gabapentin  100 mg 3 times daily.  Numbness is stable in the fingertips.   5.  Anxiety: - Continue Paxil  40 mg daily and Klonopin  1 mg 3 times daily.  Symptoms well-controlled.  6.  Newly diagnosed diabetes: - Continue glimepiride  1 mg tablet with breakfast.  Blood sugar today is 151.  She does not have a primary care doctor yet.  We will check hemoglobin A1c level.    Orders Placed This Encounter  Procedures   CT CHEST ABDOMEN PELVIS W CONTRAST    Standing Status:   Future    Expected Date:   12/18/2023    Expiration Date:   11/16/2024    If indicated for the ordered procedure, I authorize the administration of contrast media per Radiology protocol:   Yes    Does the patient have a contrast media/X-ray dye allergy?:   No    Preferred imaging location?:   [facility]    If indicated for the ordered procedure, I authorize the administration of oral contrast media  per Radiology protocol:   Yes   CEA    Standing Status:   Future    Expected Date:   12/01/2023    Expiration Date:   11/30/2024   Magnesium    Standing Status:   Future    Expected Date:   12/01/2023    Expiration Date:   11/30/2024   CBC with Differential    Standing Status:   Future    Expected Date:   12/01/2023    Expiration Date:   11/30/2024   Comprehensive metabolic panel    Standing  Status:   Future    Expected Date:   12/01/2023    Expiration Date:   11/30/2024   Urinalysis, dipstick only    Standing Status:   Future    Expected Date:   12/01/2023    Expiration Date:   11/30/2024   CEA    Standing Status:   Future    Expected Date:   12/15/2023    Expiration Date:   12/14/2024   Magnesium    Standing Status:   Future    Expected Date:   12/15/2023    Expiration Date:   12/14/2024   CBC with Differential    Standing Status:   Future    Expected Date:   12/15/2023    Expiration Date:   12/14/2024   Comprehensive metabolic panel    Standing Status:   Future    Expected Date:   12/15/2023    Expiration Date:   12/14/2024   Urinalysis, dipstick only    Standing Status:   Future    Expected Date:   12/15/2023    Expiration Date:   12/14/2024   CEA    Standing Status:   Future    Expected Date:   12/29/2023    Expiration Date:   12/28/2024   Magnesium    Standing Status:   Future    Expected Date:   12/29/2023    Expiration Date:   12/28/2024   CBC with Differential    Standing Status:   Future    Expected Date:   12/29/2023    Expiration Date:   12/28/2024   Comprehensive metabolic panel    Standing Status:   Future    Expected Date:   12/29/2023    Expiration Date:   12/28/2024   Urinalysis, dipstick only    Standing Status:   Future    Expected Date:   12/29/2023    Expiration Date:   12/28/2024   CEA    Standing Status:   Future    Expected Date:   01/12/2024    Expiration Date:   01/11/2025   Magnesium    Standing Status:   Future    Expected Date:   01/12/2024    Expiration Date:   01/11/2025   CBC with Differential    Standing Status:   Future    Expected Date:   01/12/2024    Expiration Date:   01/11/2025   Comprehensive metabolic panel    Standing Status:   Future    Expected Date:   01/12/2024    Expiration Date:   01/11/2025   Urinalysis, dipstick only    Standing Status:   Future    Expected Date:   01/12/2024    Expiration Date:   01/11/2025      Hurman Maiden R  Teague,acting as a scribe for Paulett Boros, MD.,have documented all relevant documentation on the behalf of Paulett Boros, MD,as directed by  Paulett Boros, MD while in the presence of Paulett Boros, MD.  I, Paulett Boros MD, have reviewed the above documentation for accuracy and completeness, and I agree with the above.    Paulett Boros, MD   5/21/202511:13 AM  CHIEF COMPLAINT:   Diagnosis: metastatic colon cancer to liver    Cancer Staging  No matching staging information was found for the patient.    Prior Therapy: 1. Sigmoid colectomy and end colostomy on 04/29/2020  2. FOLFOX with bevacizumab , 06/04/20 - 11/06/20 3. Maintenance 5-FU and bevacizumab , 11/20/20 - 02/17/23 4. FOLFOX with bevacizumab , 03/10/23 - 05/24/23  Current Therapy:  FOLFIRI with bevacizumab     HISTORY OF PRESENT ILLNESS:   Oncology History  Metastatic colon cancer to liver (HCC)  05/16/2020 Initial Diagnosis   Metastatic colon cancer to liver (HCC)   06/03/2020 Genetic Testing   Foundation One:     06/04/2020 - 02/25/2022 Chemotherapy   Patient is on Treatment Plan : COLORECTAL FOLFOX + Bevacizumab  q14d     06/04/2020 - 05/26/2023 Chemotherapy   Patient is on Treatment Plan : COLORECTAL FOLFOX + Bevacizumab  q14d     06/09/2023 -  Chemotherapy   Patient is on Treatment Plan : COLORECTAL FOLFIRI + Bevacizumab  q14d        INTERVAL HISTORY:   Susan Martin is a 63 y.o. female presenting to clinic today for follow up of metastatic colon cancer to liver. She was last seen by me on 10/06/23.  Today, she states that she is doing well overall. Her appetite level is at 75%. Her energy level is at 70%.  PAST MEDICAL HISTORY:   Past Medical History: Past Medical History:  Diagnosis Date   Adenocarcinoma of colon metastatic to liver [facility]) onocology--- dr s. Cheree Cords   04-29-2020 [facility] surgery for perforated colon s/p sigmoid colectomy w/ colostomy; dx  Stage IV colon  cancer mets to liver   Anxiety    Depression    Hypertension    followed by dr Monty App and oncology  (05-27-2020 per pt does not have pcp yet)   IDA (iron deficiency anemia)    Pulmonary nodule, right     Surgical History: Past Surgical History:  Procedure Laterality Date   ABDOMINAL HYSTERECTOMY  03-31-2016   @[facility]    W/  BILATERAL SALPINOOPHORECTOMY    APPLICATION OF WOUND VAC N/A 04/29/2020   Procedure: APPLICATION OF WOUND VAC;  Surgeon: Derral Flick, MD;  Location: [facility] OR;  Service: General;  Laterality: N/A;   ENDOMETRIAL ABLATION     LAPAROTOMY N/A 04/29/2020   Procedure: EXPLORATORY LAPAROTOMY;  Surgeon: Derral Flick, MD;  Location: [facility] OR;  Service: General;  Laterality: N/A;   PARTIAL COLECTOMY N/A 04/29/2020   Procedure: PARTIAL COLECTOMY WITH END COLOSTOMY;  Surgeon: Dorrie Gaudier Alphonso Aschoff, MD;  Location: [facility] OR;  Service: General;  Laterality: N/A;   PORTACATH PLACEMENT Right 05/28/2020   Procedure: INSERTION PORT-A-CATH;  Surgeon: Kinsinger, Alphonso Aschoff, MD;  Location: [facility];  Service: General;  Laterality: Right;   SALPINGOOPHORECTOMY Left 03/31/2016   Procedure: LEFT SALPINGO OOPHORECTOMY WITH FROZEN SECTION,  ABDOMINAL HYSTERECTOMY WITH BILATERAL SALPINGO-OOPHORECTOMY;  Surgeon: Albino Hum, MD;  Location: [facility];  Service: Gynecology;  Laterality: Left;    Social History: Social History   Socioeconomic History   Marital status: Divorced    Spouse name: Not on file   Number of children: Not on file   Years of education: Not on file   Highest education level: Not on file  Occupational History   Not on file  Tobacco Use   Smoking status: Former    Current packs/day: 0.00    Average packs/day: 0.5 packs/day for 5.0 years (2.5 ttl pk-yrs)    Types: Cigarettes    Start date: 03/27/2010    Quit date: 03/28/2015    Years since quitting: 8.6   Smokeless tobacco: Never  Vaping Use   Vaping status: Never Used  Substance and Sexual  Activity   Alcohol use: No   Drug use: Never   Sexual activity: Yes    Partners: Male    Birth control/protection: Surgical  Other Topics Concern   Not on file  Social History Narrative   Not on file   Social Drivers of Health   Financial Resource Strain: Low Risk  (11/01/2019)   Overall Financial Resource Strain (CARDIA)    Difficulty of Paying Living Expenses: Not very hard  Food Insecurity: No Food Insecurity (11/01/2019)   Hunger Vital Sign    Worried About Running Out of Food in the Last Year: Never true    Ran Out of Food in the Last Year: Never true  Transportation Needs: No Transportation Needs (11/01/2019)   PRAPARE - Administrator, Civil Service (Medical): No    Lack of Transportation (Non-Medical): No  Physical Activity: Insufficiently Active (11/01/2019)   Exercise Vital Sign    Days of Exercise per Week: 2 days    Minutes of Exercise per Session: 30 min  Stress: Stress Concern Present (11/01/2019)   [employer] of Occupational Health - Occupational Stress Questionnaire    Feeling of Stress : To some extent  Social Connections: Moderately Integrated (11/01/2019)   Social Connection and Isolation Panel [NHANES]    Frequency of Communication with Friends and Family: More than three times a week    Frequency of Social Gatherings with Friends and Family: Once a week    Attends Religious Services: More than 4 times per year    Active Member of [employer] or Organizations: No    Attends Banker Meetings: Never    Marital Status: Living with partner  Intimate Partner Violence: Not At Risk (11/01/2019)   Humiliation, Afraid, Rape, and Kick questionnaire    Fear of Current or Ex-Partner: No    Emotionally Abused: No    Physically Abused: No    Sexually Abused: No    Family History: Family History  Problem Relation Age of Onset   Hypertension Mother    Diabetes Mother    Cirrhosis Father     Current Medications:  Current Outpatient Medications:     amLODipine  (NORVASC ) 10 MG tablet, TAKE 1 TABLET BY MOUTH EVERY DAY, Disp: 90 tablet, Rfl: 2   amLODipine  (NORVASC ) 5 MG tablet, TAKE 1 TABLET (5 MG TOTAL) BY MOUTH DAILY., Disp: 90 tablet, Rfl: 1   BEVACIZUMAB  IV, Inject 5 mg/kg into the vein every 14 (fourteen) days., Disp: , Rfl:    blood glucose meter kit and supplies KIT, Dispense based on patient and insurance preference. Use up to four times daily as directed., Disp: 1 each, Rfl: 6   clonazePAM  (KLONOPIN ) 1 MG tablet, Take 1 tablet (1 mg total) by mouth in the morning, at noon, and at bedtime., Disp: 90 tablet, Rfl: 3   fluorouracil  CALGB 45409 in sodium chloride  0.9 % 150 mL, Inject 2,400 mg/m2 into the vein over 48 hr., Disp: , Rfl:    gabapentin  (NEURONTIN ) 100 MG capsule, TAKE 1 CAPSULE (100 MG TOTAL) BY  MOUTH 3 TIMES A DAY, Disp: 90 capsule, Rfl: 3   glimepiride  (AMARYL ) 1 MG tablet, TAKE 1 TABLET BY MOUTH EVERY DAY WITH BREAKFAST, Disp: 90 tablet, Rfl: 1   Lancets (ONETOUCH DELICA PLUS LANCET33G) MISC, Apply topically., Disp: , Rfl:    LEUCOVORIN  CALCIUM  IV, Inject 400 mg/m2 into the vein every 21 ( twenty-one) days., Disp: , Rfl:    lidocaine  (XYLOCAINE ) 2 % solution, Use as directed 15 mLs in the mouth or throat every 6 (six) hours as needed for mouth pain. Swish and spit/swallow every six hours as needed for mouth pain, Disp: 480 mL, Rfl: 3   lidocaine -prilocaine  (EMLA ) cream, Apply 1 Application topically as needed., Disp: 30 g, Rfl: 0   Misc. Devices MISC, Please provide with item number [id number] 2 3/4 Hollister Ostomy bags and item number [id number] [place] 2 3/4, Disp: 5 Box, Rfl: 12   nystatin (MYCOSTATIN) 100000 UNIT/ML suspension, Take by mouth., Disp: , Rfl:    ONETOUCH ULTRA test strip, Use as instructed, Disp: 100 each, Rfl: 6   OXALIPLATIN  IV, Inject into the vein every 14 (fourteen) days., Disp: , Rfl:    oxyCODONE  (OXY IR/ROXICODONE ) 5 MG immediate release tablet, Take 5 mg by mouth every 6 (six) hours as needed. for pain,  Disp: , Rfl:    PARoxetine  (PAXIL ) 40 MG tablet, TAKE 1 TABLET BY MOUTH EVERY DAY IN THE MORNING, Disp: 90 tablet, Rfl: 3   polyethylene glycol (MIRALAX  / GLYCOLAX ) packet, Take 17 g by mouth every other day., Disp: , Rfl:    potassium chloride  (KLOR-CON  M10) 10 MEQ tablet, Take 2 tablets (20 mEq total) by mouth daily., Disp: 180 tablet, Rfl: 3   triamterene -hydrochlorothiazide  (MAXZIDE -25) 37.5-25 MG tablet, TAKE 1 TABLET BY MOUTH EVERY DAY, Disp: 90 tablet, Rfl: 1   Allergies: No Known Allergies  REVIEW OF SYSTEMS:   Review of Systems  Constitutional:  Negative for chills, fatigue and fever.  HENT:   Negative for lump/mass, mouth sores, nosebleeds, sore throat and trouble swallowing.   Eyes:  Negative for eye problems.  Respiratory:  Negative for cough and shortness of breath.   Cardiovascular:  Negative for chest pain, leg swelling and palpitations.  Gastrointestinal:  Positive for diarrhea. Negative for abdominal pain, constipation, nausea and vomiting.  Genitourinary:  Negative for bladder incontinence, difficulty urinating, dysuria, frequency, hematuria and nocturia.   Musculoskeletal:  Negative for arthralgias, back pain, flank pain, myalgias and neck pain.  Skin:  Negative for itching and rash.  Neurological:  Positive for numbness. Negative for dizziness and headaches.  Hematological:  Does not bruise/bleed easily.  Psychiatric/Behavioral:  Positive for depression. Negative for sleep disturbance and suicidal ideas. The patient is nervous/anxious.   All other systems reviewed and are negative.    VITALS:   Blood pressure (!) 140/67.  Wt Readings from Last 3 Encounters:  11/17/23 169 lb 15.6 oz (77.1 kg)  11/03/23 170 lb (77.1 kg)  10/20/23 171 lb 1.2 oz (77.6 kg)    There is no height or weight on file to calculate BMI.  Performance status (ECOG): 1 - Symptomatic but completely ambulatory  PHYSICAL EXAM:   Physical Exam Vitals and nursing note reviewed. Exam  conducted with a chaperone present.  Constitutional:      Appearance: Normal appearance.  Cardiovascular:     Rate and Rhythm: Normal rate and regular rhythm.     Pulses: Normal pulses.     Heart sounds: Normal heart sounds.  Pulmonary:     Effort:  Pulmonary effort is normal.     Breath sounds: Normal breath sounds.  Abdominal:     Palpations: Abdomen is soft. There is no hepatomegaly, splenomegaly or mass.     Tenderness: There is no abdominal tenderness.  Musculoskeletal:     Right lower leg: No edema.     Left lower leg: No edema.  Lymphadenopathy:     Cervical: No cervical adenopathy.     Right cervical: No superficial, deep or posterior cervical adenopathy.    Left cervical: No superficial, deep or posterior cervical adenopathy.     Upper Body:     Right upper body: No supraclavicular or axillary adenopathy.     Left upper body: No supraclavicular or axillary adenopathy.  Neurological:     General: No focal deficit present.     Mental Status: She is alert and oriented to person, place, and time.  Psychiatric:        Mood and Affect: Mood normal.        Behavior: Behavior normal.     LABS:   CBC     Component Value Date/Time   WBC 9.6 11/17/2023 0956   RBC 3.84 (L) 11/17/2023 0956   HGB 11.3 (L) 11/17/2023 0956   HGB 14.9 03/05/2016 1201   HCT 36.9 11/17/2023 0956   HCT 43.4 03/05/2016 1201   PLT 102 (L) 11/17/2023 0956   PLT 340 03/05/2016 1201   MCV 96.1 11/17/2023 0956   MCV 90 03/05/2016 1201   MCH 29.4 11/17/2023 0956   MCHC 30.6 11/17/2023 0956   RDW 18.8 (H) 11/17/2023 0956   RDW 12.8 03/05/2016 1201   LYMPHSABS 1.5 11/17/2023 0956   LYMPHSABS 1.0 03/05/2016 1201   MONOABS 0.8 11/17/2023 0956   EOSABS 0.3 11/17/2023 0956   EOSABS 0.0 03/05/2016 1201   BASOSABS 0.1 11/17/2023 0956   BASOSABS 0.0 03/05/2016 1201    CMP      Component Value Date/Time   NA 133 (L) 11/17/2023 0956   NA 140 03/05/2016 1201   K 3.4 (L) 11/17/2023 0956   CL 101  11/17/2023 0956   CO2 24 11/17/2023 0956   GLUCOSE 151 (H) 11/17/2023 0956   BUN 9 11/17/2023 0956   BUN 10 03/05/2016 1201   CREATININE 0.87 11/17/2023 0956   CALCIUM  9.1 11/17/2023 0956   PROT 7.1 11/17/2023 0956   PROT 7.4 03/05/2016 1201   ALBUMIN 3.6 11/17/2023 0956   ALBUMIN 4.0 03/05/2016 1201   AST 44 (H) 11/17/2023 0956   ALT 34 11/17/2023 0956   ALKPHOS 193 (H) 11/17/2023 0956   BILITOT 0.8 11/17/2023 0956   BILITOT 0.3 03/05/2016 1201   GFRNONAA >60 11/17/2023 0956   GFRAA >60 04/01/2016 0541     Lab Results  Component Value Date   CEA1 9.3 (H) 11/03/2023   /  CEA  Date Value Ref Range Status  11/03/2023 9.3 (H) 0.0 - 4.7 ng/mL Final    Comment:    (NOTE)                             Nonsmokers          <3.9                             Smokers             <5.6 Roche [employer]ostics Electrochemiluminescence Immunoassay (ECLIA) Values obtained with different assay  methods or kits cannot be used interchangeably.  Results cannot be interpreted as absolute evidence of the presence or absence of malignant disease. Performed At: [facility] [address] [place], [place] [id number] Pearlean Botts MD WU:[id number]    No results found for: "PSA1" No results found for: "CAN199" No results found for: "CAN125"  No results found for: "TOTALPROTELP", "ALBUMINELP", "A1GS", "A2GS", "BETS", "BETA2SER", "GAMS", "MSPIKE", "SPEI" Lab Results  Component Value Date   TIBC 248 (L) 04/29/2020   FERRITIN 57 04/29/2020   IRONPCTSAT 3 (L) 04/29/2020   No results found for: "LDH"   STUDIES:   No results found.

## 2023-11-18 ENCOUNTER — Other Ambulatory Visit: Payer: Self-pay

## 2023-11-18 LAB — CEA: CEA: 9.9 ng/mL — ABNORMAL HIGH (ref 0.0–4.7)

## 2023-11-19 ENCOUNTER — Inpatient Hospital Stay

## 2023-11-19 VITALS — BP 133/73 | HR 63 | Temp 97.7°F | Resp 18

## 2023-11-19 DIAGNOSIS — Z5111 Encounter for antineoplastic chemotherapy: Secondary | ICD-10-CM | POA: Diagnosis not present

## 2023-11-19 DIAGNOSIS — C189 Malignant neoplasm of colon, unspecified: Secondary | ICD-10-CM

## 2023-11-19 MED ORDER — HEPARIN SOD (PORK) LOCK FLUSH 100 UNIT/ML IV SOLN
500.0000 [IU] | Freq: Once | INTRAVENOUS | Status: AC | PRN
  Administered 2023-11-19: 500 [IU]

## 2023-11-19 MED ORDER — PEGFILGRASTIM-JMDB 6 MG/0.6ML ~~LOC~~ SOSY
6.0000 mg | PREFILLED_SYRINGE | Freq: Once | SUBCUTANEOUS | Status: AC
  Administered 2023-11-19: 6 mg via SUBCUTANEOUS
  Filled 2023-11-19: qty 0.6

## 2023-11-19 MED ORDER — SODIUM CHLORIDE 0.9% FLUSH
10.0000 mL | INTRAVENOUS | Status: DC | PRN
  Administered 2023-11-19: 10 mL

## 2023-11-19 NOTE — Patient Instructions (Signed)
 [facility] [place] - A DEPT OF [facility]  Discharge Instructions: Thank you for choosing [facility] to provide your oncology and hematology care.  If you have a lab appointment with the Cancer Center - please note that after April 8th, 2024, all labs will be drawn in the cancer center.  You do not have to check in or register with the main entrance as you have in the past but will complete your check-in in the cancer center.  Wear comfortable clothing and clothing appropriate for easy access to any Portacath or PICC line.   We strive to give you quality time with your provider. You may need to reschedule your appointment if you arrive late (15 or more minutes).  Arriving late affects you and other patients whose appointments are after yours.  Also, if you miss three or more appointments without notifying the office, you may be dismissed from the clinic at the provider's discretion.      For prescription refill requests, have your pharmacy contact our office and allow 72 hours for refills to be completed.    Today you received the following pump d/c and injection, return as scheduled.   To help prevent nausea and vomiting after your treatment, we encourage you to take your nausea medication as directed.  BELOW ARE SYMPTOMS THAT SHOULD BE REPORTED IMMEDIATELY: *FEVER GREATER THAN 100.4 F (38 C) OR HIGHER *CHILLS OR SWEATING *NAUSEA AND VOMITING THAT IS NOT CONTROLLED WITH YOUR NAUSEA MEDICATION *UNUSUAL SHORTNESS OF BREATH *UNUSUAL BRUISING OR BLEEDING *URINARY PROBLEMS (pain or burning when urinating, or frequent urination) *BOWEL PROBLEMS (unusual diarrhea, constipation, pain near the anus) TENDERNESS IN MOUTH AND THROAT WITH OR WITHOUT PRESENCE OF ULCERS (sore throat, sores in mouth, or a toothache) UNUSUAL RASH, SWELLING OR PAIN  UNUSUAL VAGINAL DISCHARGE OR ITCHING   Items with * indicate a potential emergency and should be followed up as soon  as possible or go to the Emergency Department if any problems should occur.  Please show the CHEMOTHERAPY ALERT CARD or IMMUNOTHERAPY ALERT CARD at check-in to the Emergency Department and triage nurse.  Should you have questions after your visit or need to cancel or reschedule your appointment, please contact [facility] [place] - A DEPT OF [facility] [phone]  and follow the prompts.  Office hours are 8:00 a.m. to 4:30 p.m. Monday - Friday. Please note that voicemails left after 4:00 p.m. may not be returned until the following business day.  We are closed weekends and major holidays. You have access to a nurse at all times for urgent questions. Please call the main number to the clinic [phone] and follow the prompts.  For any non-urgent questions, you may also contact your provider using MyChart. We now offer e-Visits for anyone 70 and older to request care online for non-urgent symptoms. For details visit [email or web address].   Also download the MyChart app! Go to the app store, search "MyChart", open the app, select [facility], and log in with your MyChart username and password.

## 2023-11-19 NOTE — Progress Notes (Signed)
 Patient tolerated injection with no complaints voiced. Site clean and dry with no bruising or swelling noted at site. See MAR for details. Band aid applied.  Patient stable during and after injection.  Port flushed with good blood return noted. No bruising or swelling at site. Bandaid applied and patient discharged in satisfactory condition. VVS stable with no signs or symptoms of distressed noted.

## 2023-11-26 ENCOUNTER — Other Ambulatory Visit: Payer: Self-pay | Admitting: Hematology

## 2023-12-01 ENCOUNTER — Inpatient Hospital Stay

## 2023-12-01 ENCOUNTER — Inpatient Hospital Stay: Attending: Hematology

## 2023-12-01 ENCOUNTER — Other Ambulatory Visit: Payer: Self-pay | Admitting: *Deleted

## 2023-12-01 VITALS — BP 154/66 | HR 80 | Temp 97.4°F | Resp 16

## 2023-12-01 DIAGNOSIS — E876 Hypokalemia: Secondary | ICD-10-CM | POA: Diagnosis not present

## 2023-12-01 DIAGNOSIS — Z933 Colostomy status: Secondary | ICD-10-CM | POA: Insufficient documentation

## 2023-12-01 DIAGNOSIS — C189 Malignant neoplasm of colon, unspecified: Secondary | ICD-10-CM | POA: Insufficient documentation

## 2023-12-01 DIAGNOSIS — R161 Splenomegaly, not elsewhere classified: Secondary | ICD-10-CM | POA: Insufficient documentation

## 2023-12-01 DIAGNOSIS — D509 Iron deficiency anemia, unspecified: Secondary | ICD-10-CM | POA: Diagnosis not present

## 2023-12-01 DIAGNOSIS — Z7984 Long term (current) use of oral hypoglycemic drugs: Secondary | ICD-10-CM | POA: Insufficient documentation

## 2023-12-01 DIAGNOSIS — R918 Other nonspecific abnormal finding of lung field: Secondary | ICD-10-CM | POA: Insufficient documentation

## 2023-12-01 DIAGNOSIS — Z87891 Personal history of nicotine dependence: Secondary | ICD-10-CM | POA: Insufficient documentation

## 2023-12-01 DIAGNOSIS — D696 Thrombocytopenia, unspecified: Secondary | ICD-10-CM | POA: Insufficient documentation

## 2023-12-01 DIAGNOSIS — I10 Essential (primary) hypertension: Secondary | ICD-10-CM | POA: Insufficient documentation

## 2023-12-01 DIAGNOSIS — C787 Secondary malignant neoplasm of liver and intrahepatic bile duct: Secondary | ICD-10-CM | POA: Insufficient documentation

## 2023-12-01 DIAGNOSIS — Z5111 Encounter for antineoplastic chemotherapy: Secondary | ICD-10-CM | POA: Diagnosis present

## 2023-12-01 DIAGNOSIS — E1142 Type 2 diabetes mellitus with diabetic polyneuropathy: Secondary | ICD-10-CM | POA: Insufficient documentation

## 2023-12-01 DIAGNOSIS — I700 Atherosclerosis of aorta: Secondary | ICD-10-CM | POA: Insufficient documentation

## 2023-12-01 DIAGNOSIS — C20 Malignant neoplasm of rectum: Secondary | ICD-10-CM | POA: Diagnosis not present

## 2023-12-01 DIAGNOSIS — Z801 Family history of malignant neoplasm of trachea, bronchus and lung: Secondary | ICD-10-CM | POA: Insufficient documentation

## 2023-12-01 DIAGNOSIS — Z79899 Other long term (current) drug therapy: Secondary | ICD-10-CM | POA: Insufficient documentation

## 2023-12-01 DIAGNOSIS — R5383 Other fatigue: Secondary | ICD-10-CM | POA: Diagnosis not present

## 2023-12-01 LAB — CBC WITH DIFFERENTIAL/PLATELET
Abs Immature Granulocytes: 0.05 10*3/uL (ref 0.00–0.07)
Basophils Absolute: 0.1 10*3/uL (ref 0.0–0.1)
Basophils Relative: 1 %
Eosinophils Absolute: 0.2 10*3/uL (ref 0.0–0.5)
Eosinophils Relative: 2 %
HCT: 36.2 % (ref 36.0–46.0)
Hemoglobin: 11.7 g/dL — ABNORMAL LOW (ref 12.0–15.0)
Immature Granulocytes: 1 %
Lymphocytes Relative: 18 %
Lymphs Abs: 1.5 10*3/uL (ref 0.7–4.0)
MCH: 30.5 pg (ref 26.0–34.0)
MCHC: 32.3 g/dL (ref 30.0–36.0)
MCV: 94.3 fL (ref 80.0–100.0)
Monocytes Absolute: 0.9 10*3/uL (ref 0.1–1.0)
Monocytes Relative: 10 %
Neutro Abs: 5.8 10*3/uL (ref 1.7–7.7)
Neutrophils Relative %: 68 %
Platelets: 109 10*3/uL — ABNORMAL LOW (ref 150–400)
RBC: 3.84 MIL/uL — ABNORMAL LOW (ref 3.87–5.11)
RDW: 18.7 % — ABNORMAL HIGH (ref 11.5–15.5)
WBC: 8.5 10*3/uL (ref 4.0–10.5)
nRBC: 0 % (ref 0.0–0.2)

## 2023-12-01 LAB — URINALYSIS, DIPSTICK ONLY
Bilirubin Urine: NEGATIVE
Glucose, UA: NEGATIVE mg/dL
Hgb urine dipstick: NEGATIVE
Ketones, ur: NEGATIVE mg/dL
Leukocytes,Ua: NEGATIVE
Nitrite: NEGATIVE
Protein, ur: NEGATIVE mg/dL
Specific Gravity, Urine: 1.006 (ref 1.005–1.030)
pH: 6 (ref 5.0–8.0)

## 2023-12-01 LAB — COMPREHENSIVE METABOLIC PANEL WITH GFR
ALT: 33 U/L (ref 0–44)
AST: 44 U/L — ABNORMAL HIGH (ref 15–41)
Albumin: 3.5 g/dL (ref 3.5–5.0)
Alkaline Phosphatase: 192 U/L — ABNORMAL HIGH (ref 38–126)
Anion gap: 9 (ref 5–15)
BUN: 8 mg/dL (ref 8–23)
CO2: 24 mmol/L (ref 22–32)
Calcium: 8.9 mg/dL (ref 8.9–10.3)
Chloride: 101 mmol/L (ref 98–111)
Creatinine, Ser: 0.94 mg/dL (ref 0.44–1.00)
GFR, Estimated: >60 mL/min (ref >60)
Glucose, Bld: 134 mg/dL — ABNORMAL HIGH (ref 70–99)
Potassium: 3.5 mmol/L (ref 3.5–5.1)
Sodium: 134 mmol/L — ABNORMAL LOW (ref 135–145)
Total Bilirubin: 1 mg/dL (ref 0.0–1.2)
Total Protein: 7.1 g/dL (ref 6.5–8.1)

## 2023-12-01 LAB — MAGNESIUM: Magnesium: 1.9 mg/dL (ref 1.7–2.4)

## 2023-12-01 MED ORDER — SODIUM CHLORIDE 0.9 % IV SOLN: INTRAVENOUS | Status: DC

## 2023-12-01 MED ORDER — HEPARIN SOD (PORK) LOCK FLUSH 100 UNIT/ML IV SOLN
500.0000 [IU] | Freq: Once | INTRAVENOUS | Status: DC | PRN
Start: 2023-12-01 — End: 2023-12-01

## 2023-12-01 MED ORDER — PALONOSETRON HCL INJECTION 0.25 MG/5ML
0.2500 mg | Freq: Once | INTRAVENOUS | Status: AC
  Administered 2023-12-01: 0.25 mg via INTRAVENOUS
  Filled 2023-12-01: qty 5

## 2023-12-01 MED ORDER — SODIUM CHLORIDE 0.9 % IV SOLN
144.0000 mg/m2 | Freq: Once | INTRAVENOUS | Status: AC
  Administered 2023-12-01: 300 mg via INTRAVENOUS
  Filled 2023-12-01: qty 15

## 2023-12-01 MED ORDER — SODIUM CHLORIDE 0.9 % IV SOLN
320.0000 mg/m2 | Freq: Once | INTRAVENOUS | Status: AC
  Administered 2023-12-01: 608 mg via INTRAVENOUS
  Filled 2023-12-01: qty 30.4

## 2023-12-01 MED ORDER — SODIUM CHLORIDE 0.9 % IV SOLN
1920.0000 mg/m2 | INTRAVENOUS | Status: DC
  Administered 2023-12-01: 3500 mg via INTRAVENOUS
  Filled 2023-12-01: qty 70

## 2023-12-01 MED ORDER — PAROXETINE HCL 40 MG PO TABS: 40.0000 mg | ORAL_TABLET | ORAL | 3 refills | Status: AC

## 2023-12-01 MED ORDER — DEXAMETHASONE SODIUM PHOSPHATE 10 MG/ML IJ SOLN
10.0000 mg | Freq: Once | INTRAMUSCULAR | Status: AC
  Administered 2023-12-01: 10 mg via INTRAVENOUS
  Filled 2023-12-01: qty 1

## 2023-12-01 MED ORDER — FLUOROURACIL CHEMO INJECTION 2.5 GM/50ML
320.0000 mg/m2 | Freq: Once | INTRAVENOUS | Status: AC
  Administered 2023-12-01: 600 mg via INTRAVENOUS
  Filled 2023-12-01: qty 12

## 2023-12-01 MED ORDER — SODIUM CHLORIDE 0.9% FLUSH
10.0000 mL | INTRAVENOUS | Status: DC | PRN
  Administered 2023-12-01: 10 mL via INTRAVENOUS

## 2023-12-01 MED ORDER — SODIUM CHLORIDE 0.9% FLUSH: 10.0000 mL | INTRAVENOUS | Status: DC | PRN

## 2023-12-01 MED ORDER — SODIUM CHLORIDE 0.9 % IV SOLN
5.0000 mg/kg | Freq: Once | INTRAVENOUS | Status: AC
  Administered 2023-12-01: 400 mg via INTRAVENOUS
  Filled 2023-12-01: qty 16

## 2023-12-01 MED ORDER — ATROPINE SULFATE 1 MG/ML IV SOLN
0.5000 mg | Freq: Once | INTRAVENOUS | Status: AC
  Administered 2023-12-01: 0.5 mg via INTRAVENOUS
  Filled 2023-12-01: qty 1

## 2023-12-01 NOTE — Progress Notes (Signed)
 Patient presents today for chemotherapy infusion.  Patient is in satisfactory condition with no new complaints voiced.  Vital signs are stable.  Labs reviewed and all labs are within treatment parameters.  Urine protein is negative.  We will proceed with treatment per MD orders.    Patient tolerated treatment well with no complaints voiced.  Home infusion 5FU pump connected.  Patient left ambulatory in stable condition.  Vital signs stable at discharge.  Follow up as scheduled.

## 2023-12-01 NOTE — Patient Instructions (Signed)
 [facility] - A DEPT OF [facility]  Discharge Instructions: Thank you for choosing [facility] to provide your oncology and hematology care.  If you have a lab appointment with the Cancer Center - please note that after April 8th, 2024, all labs will be drawn in the cancer center.  You do not have to check in or register with the main entrance as you have in the past but will complete your check-in in the cancer center.  Wear comfortable clothing and clothing appropriate for easy access to any Portacath or PICC line.   We strive to give you quality time with your provider. You may need to reschedule your appointment if you arrive late (15 or more minutes).  Arriving late affects you and other patients whose appointments are after yours.  Also, if you miss three or more appointments without notifying the office, you may be dismissed from the clinic at the provider's discretion.      For prescription refill requests, have your pharmacy contact our office and allow 72 hours for refills to be completed.    Today you received the following chemotherapy and/or immunotherapy agents MVASI/Irinotecan/Leucovorin/5FU.  Bevacizumab Injection What is this medication? BEVACIZUMAB (be va SIZ yoo mab) treats some types of cancer. It works by blocking a protein that causes cancer cells to grow and multiply. This helps to slow or stop the spread of cancer cells. It is a monoclonal antibody. This medicine may be used for other purposes; ask your health care provider or pharmacist if you have questions. COMMON BRAND NAME(S): Alymsys, Avastin, MVASI, [place] Lao What should I tell my care team before I take this medication? They need to know if you have any of these conditions: Blood clots Coughing up blood Having or recent surgery Heart failure High blood pressure History of a connection between 2 or more body parts that do not usually connect  (fistula) History of a tear in your stomach or intestines Protein in your urine An unusual or allergic reaction to bevacizumab, other medications, foods, dyes, or preservatives Pregnant or trying to get pregnant Breast-feeding How should I use this medication? This medication is injected into a vein. It is given by your care team in a hospital or clinic setting. Talk to your care team the use of this medication in children. Special care may be needed. Overdosage: If you think you have taken too much of this medicine contact a poison control center or emergency room at once. NOTE: This medicine is only for you. Do not share this medicine with others. What if I miss a dose? Keep appointments for follow-up doses. It is important not to miss your dose. Call your care team if you are unable to keep an appointment. What may interact with this medication? Interactions are not expected. This list may not describe all possible interactions. Give your health care provider a list of all the medicines, herbs, non-prescription drugs, or dietary supplements you use. Also tell them if you smoke, drink alcohol, or use illegal drugs. Some items may interact with your medicine. What should I watch for while using this medication? Your condition will be monitored carefully while you are receiving this medication. You may need blood work while taking this medication. This medication may make you feel generally unwell. This is not uncommon as chemotherapy can affect healthy cells as well as cancer cells. Report any side effects. Continue your course of treatment even though you feel  ill unless your care team tells you to stop. This medication may increase your risk to bruise or bleed. Call your care team if you notice any unusual bleeding. Before having surgery, talk to your care team to make sure it is ok. This medication can increase the risk of poor healing of your surgical site or wound. You will need to stop  this medication for 28 days before surgery. After surgery, wait at least 28 days before restarting this medication. Make sure the surgical site or wound is healed enough before restarting this medication. Talk to your care team if questions. Talk to your care team if you may be pregnant. Serious birth defects can occur if you take this medication during pregnancy and for 6 months after the last dose. Contraception is recommended while taking this medication and for 6 months after the last dose. Your care team can help you find the option that works for you. Do not breastfeed while taking this medication and for 6 months after the last dose. This medication can cause infertility. Talk to your care team if you are concerned about your fertility. What side effects may I notice from receiving this medication? Side effects that you should report to your care team as soon as possible: Allergic reactions--skin rash, itching, hives, swelling of the face, lips, tongue, or throat Bleeding--bloody or black, tar-like stools, vomiting blood or Arlone Lenhardt material that looks like coffee grounds, red or dark Serrita Lueth urine, small red or purple spots on skin, unusual bruising or bleeding Blood clot--pain, swelling, or warmth in the leg, shortness of breath, chest pain Heart attack--pain or tightness in the chest, shoulders, arms, or jaw, nausea, shortness of breath, cold or clammy skin, feeling faint or lightheaded Heart failure--shortness of breath, swelling of the ankles, feet, or hands, sudden weight gain, unusual weakness or fatigue Increase in blood pressure Infection--fever, chills, cough, sore throat, wounds that don't heal, pain or trouble when passing urine, general feeling of discomfort or being unwell Infusion reactions--chest pain, shortness of breath or trouble breathing, feeling faint or lightheaded Kidney injury--decrease in the amount of urine, swelling of the ankles, hands, or feet Stomach pain that is  severe, does not go away, or gets worse Stroke--sudden numbness or weakness of the face, arm, or leg, trouble speaking, confusion, trouble walking, loss of balance or coordination, dizziness, severe headache, change in vision Sudden and severe headache, confusion, change in vision, seizures, which may be signs of posterior reversible encephalopathy syndrome (PRES) Side effects that usually do not require medical attention (report to your care team if they continue or are bothersome): Back pain Change in taste Diarrhea Dry skin Increased tears Nosebleed This list may not describe all possible side effects. Call your doctor for medical advice about side effects. You may report side effects to FDA at 1-800-FDA-1088. Where should I keep my medication? This medication is given in a hospital or clinic. It will not be stored at home. NOTE: This sheet is a summary. It may not cover all possible information. If you have questions about this medicine, talk to your doctor, pharmacist, or health care provider.  2024 Elsevier/Gold Standard (2021-10-31 00:00:00)   Irinotecan Injection What is this medication? IRINOTECAN (ir in oh TEE kan) treats some types of cancer. It works by slowing down the growth of cancer cells. This medicine may be used for other purposes; ask your health care provider or pharmacist if you have questions. COMMON BRAND NAME(S): Camptosar What should I tell my care team  before I take this medication? They need to know if you have any of these conditions: Dehydration Diarrhea Infection, especially a viral infection, such as chickenpox, cold sores, herpes Liver disease Low blood cell levels (white cells, red cells, and platelets) Low levels of electrolytes, such as calcium, magnesium, or potassium in your blood Recent or ongoing radiation An unusual or allergic reaction to irinotecan, other medications, foods, dyes, or preservatives If you or your partner are pregnant or  trying to get pregnant Breast-feeding How should I use this medication? This medication is injected into a vein. It is given by your care team in a hospital or clinic setting. Talk to your care team about the use of this medication in children. Special care may be needed. Overdosage: If you think you have taken too much of this medicine contact a poison control center or emergency room at once. NOTE: This medicine is only for you. Do not share this medicine with others. What if I miss a dose? Keep appointments for follow-up doses. It is important not to miss your dose. Call your care team if you are unable to keep an appointment. What may interact with this medication? Do not take this medication with any of the following: Cobicistat Itraconazole This medication may also interact with the following: Certain antibiotics, such as clarithromycin, rifampin, rifabutin Certain antivirals for HIV or AIDS Certain medications for fungal infections, such as ketoconazole, posaconazole, voriconazole Certain medications for seizures, such as carbamazepine, phenobarbital, phenytoin Gemfibrozil Nefazodone St. John's wort This list may not describe all possible interactions. Give your health care provider a list of all the medicines, herbs, non-prescription drugs, or dietary supplements you use. Also tell them if you smoke, drink alcohol, or use illegal drugs. Some items may interact with your medicine. What should I watch for while using this medication? Your condition will be monitored carefully while you are receiving this medication. You may need blood work while taking this medication. This medication may make you feel generally unwell. This is not uncommon as chemotherapy can affect healthy cells as well as cancer cells. Report any side effects. Continue your course of treatment even though you feel ill unless your care team tells you to stop. This medication can cause serious side effects. To reduce  the risk, your care team may give you other medications to take before receiving this one. Be sure to follow the directions from your care team. This medication may affect your coordination, reaction time, or judgement. Do not drive or operate machinery until you know how this medication affects you. Sit up or stand slowly to reduce the risk of dizzy or fainting spells. Drinking alcohol with this medication can increase the risk of these side effects. This medication may increase your risk of getting an infection. Call your care team for advice if you get a fever, chills, sore throat, or other symptoms of a cold or flu. Do not treat yourself. Try to avoid being around people who are sick. Avoid taking medications that contain aspirin, acetaminophen, ibuprofen, naproxen, or ketoprofen unless instructed by your care team. These medications may hide a fever. This medication may increase your risk to bruise or bleed. Call your care team if you notice any unusual bleeding. Be careful brushing or flossing your teeth or using a toothpick because you may get an infection or bleed more easily. If you have any dental work done, tell your dentist you are receiving this medication. Talk to your care team if you or your  partner are pregnant or think either of you might be pregnant. This medication can cause serious birth defects if taken during pregnancy and for 6 months after the last dose. You will need a negative pregnancy test before starting this medication. Contraception is recommended while taking this medication and for 6 months after the last dose. Your care team can help you find the option that works for you. Do not father a child while taking this medication and for 3 months after the last dose. Use a condom for contraception during this time period. Do not breastfeed while taking this medication and for 7 days after the last dose. This medication may cause infertility. Talk to your care team if you are  concerned about your fertility. What side effects may I notice from receiving this medication? Side effects that you should report to your care team as soon as possible: Allergic reactions--skin rash, itching, hives, swelling of the face, lips, tongue, or throat Dry cough, shortness of breath or trouble breathing Increased saliva or tears, increased sweating, stomach cramping, diarrhea, small pupils, unusual weakness or fatigue, slow heartbeat Infection--fever, chills, cough, sore throat, wounds that don't heal, pain or trouble when passing urine, general feeling of discomfort or being unwell Kidney injury--decrease in the amount of urine, swelling of the ankles, hands, or feet Low red blood cell level--unusual weakness or fatigue, dizziness, headache, trouble breathing Severe or prolonged diarrhea Unusual bruising or bleeding Side effects that usually do not require medical attention (report to your care team if they continue or are bothersome): Constipation Diarrhea Hair loss Loss of appetite Nausea Stomach pain This list may not describe all possible side effects. Call your doctor for medical advice about side effects. You may report side effects to FDA at 1-800-FDA-1088. Where should I keep my medication? This medication is given in a hospital or clinic. It will not be stored at home. NOTE: This sheet is a summary. It may not cover all possible information. If you have questions about this medicine, talk to your doctor, pharmacist, or health care provider.  2024 Elsevier/Gold Standard (2021-10-27 00:00:00)   Leucovorin Injection What is this medication? LEUCOVORIN (loo koe VOR in) prevents side effects from certain medications, such as methotrexate. It works by increasing folate levels. This helps protect healthy cells in your body. It may also be used to treat anemia caused by low levels of folate. It can also be used with fluorouracil, a type of chemotherapy, to treat colorectal  cancer. It works by increasing the effects of fluorouracil in the body. This medicine may be used for other purposes; ask your health care provider or pharmacist if you have questions. What should I tell my care team before I take this medication? They need to know if you have any of these conditions: Anemia from low levels of vitamin B12 in the blood An unusual or allergic reaction to leucovorin, folic acid, other medications, foods, dyes, or preservatives Pregnant or trying to get pregnant Breastfeeding How should I use this medication? This medication is injected into a vein or a muscle. It is given by your care team in a hospital or clinic setting. Talk to your care team about the use of this medication in children. Special care may be needed. Overdosage: If you think you have taken too much of this medicine contact a poison control center or emergency room at once. NOTE: This medicine is only for you. Do not share this medicine with others. What if I miss a dose?  Keep appointments for follow-up doses. It is important not to miss your dose. Call your care team if you are unable to keep an appointment. What may interact with this medication? Capecitabine Fluorouracil Phenobarbital Phenytoin Primidone Trimethoprim;sulfamethoxazole This list may not describe all possible interactions. Give your health care provider a list of all the medicines, herbs, non-prescription drugs, or dietary supplements you use. Also tell them if you smoke, drink alcohol, or use illegal drugs. Some items may interact with your medicine. What should I watch for while using this medication? Your condition will be monitored carefully while you are receiving this medication. This medication may increase the side effects of 5-fluorouracil. Tell your care team if you have diarrhea or mouth sores that do not get better or that get worse. What side effects may I notice from receiving this medication? Side effects that  you should report to your care team as soon as possible: Allergic reactions--skin rash, itching, hives, swelling of the face, lips, tongue, or throat This list may not describe all possible side effects. Call your doctor for medical advice about side effects. You may report side effects to FDA at 1-800-FDA-1088. Where should I keep my medication? This medication is given in a hospital or clinic. It will not be stored at home. NOTE: This sheet is a summary. It may not cover all possible information. If you have questions about this medicine, talk to your doctor, pharmacist, or health care provider.  2024 Elsevier/Gold Standard (2021-11-18 00:00:00)   Fluorouracil Injection What is this medication? FLUOROURACIL (flure oh YOOR a sil) treats some types of cancer. It works by slowing down the growth of cancer cells. This medicine may be used for other purposes; ask your health care provider or pharmacist if you have questions. COMMON BRAND NAME(S): Adrucil What should I tell my care team before I take this medication? They need to know if you have any of these conditions: Blood disorders Dihydropyrimidine dehydrogenase (DPD) deficiency Infection, such as chickenpox, cold sores, herpes Kidney disease Liver disease Poor nutrition Recent or ongoing radiation therapy An unusual or allergic reaction to fluorouracil, other medications, foods, dyes, or preservatives If you or your partner are pregnant or trying to get pregnant Breast-feeding How should I use this medication? This medication is injected into a vein. It is administered by your care team in a hospital or clinic setting. Talk to your care team about the use of this medication in children. Special care may be needed. Overdosage: If you think you have taken too much of this medicine contact a poison control center or emergency room at once. NOTE: This medicine is only for you. Do not share this medicine with others. What if I miss a  dose? Keep appointments for follow-up doses. It is important not to miss your dose. Call your care team if you are unable to keep an appointment. What may interact with this medication? Do not take this medication with any of the following: Live virus vaccines This medication may also interact with the following: Medications that treat or prevent blood clots, such as warfarin, enoxaparin, dalteparin This list may not describe all possible interactions. Give your health care provider a list of all the medicines, herbs, non-prescription drugs, or dietary supplements you use. Also tell them if you smoke, drink alcohol, or use illegal drugs. Some items may interact with your medicine. What should I watch for while using this medication? Your condition will be monitored carefully while you are receiving this medication. This medication  may make you feel generally unwell. This is not uncommon as chemotherapy can affect healthy cells as well as cancer cells. Report any side effects. Continue your course of treatment even though you feel ill unless your care team tells you to stop. In some cases, you may be given additional medications to help with side effects. Follow all directions for their use. This medication may increase your risk of getting an infection. Call your care team for advice if you get a fever, chills, sore throat, or other symptoms of a cold or flu. Do not treat yourself. Try to avoid being around people who are sick. This medication may increase your risk to bruise or bleed. Call your care team if you notice any unusual bleeding. Be careful brushing or flossing your teeth or using a toothpick because you may get an infection or bleed more easily. If you have any dental work done, tell your dentist you are receiving this medication. Avoid taking medications that contain aspirin, acetaminophen, ibuprofen, naproxen, or ketoprofen unless instructed by your care team. These medications may hide  a fever. Do not treat diarrhea with over the counter products. Contact your care team if you have diarrhea that lasts more than 2 days or if it is severe and watery. This medication can make you more sensitive to the sun. Keep out of the sun. If you cannot avoid being in the sun, wear protective clothing and sunscreen. Do not use sun lamps, tanning beds, or tanning booths. Talk to your care team if you or your partner wish to become pregnant or think you might be pregnant. This medication can cause serious birth defects if taken during pregnancy and for 3 months after the last dose. A reliable form of contraception is recommended while taking this medication and for 3 months after the last dose. Talk to your care team about effective forms of contraception. Do not father a child while taking this medication and for 3 months after the last dose. Use a condom while having sex during this time period. Do not breastfeed while taking this medication. This medication may cause infertility. Talk to your care team if you are concerned about your fertility. What side effects may I notice from receiving this medication? Side effects that you should report to your care team as soon as possible: Allergic reactions--skin rash, itching, hives, swelling of the face, lips, tongue, or throat Heart attack--pain or tightness in the chest, shoulders, arms, or jaw, nausea, shortness of breath, cold or clammy skin, feeling faint or lightheaded Heart failure--shortness of breath, swelling of the ankles, feet, or hands, sudden weight gain, unusual weakness or fatigue Heart rhythm changes--fast or irregular heartbeat, dizziness, feeling faint or lightheaded, chest pain, trouble breathing High ammonia level--unusual weakness or fatigue, confusion, loss of appetite, nausea, vomiting, seizures Infection--fever, chills, cough, sore throat, wounds that don't heal, pain or trouble when passing urine, general feeling of discomfort or  being unwell Low red blood cell level--unusual weakness or fatigue, dizziness, headache, trouble breathing Pain, tingling, or numbness in the hands or feet, muscle weakness, change in vision, confusion or trouble speaking, loss of balance or coordination, trouble walking, seizures Redness, swelling, and blistering of the skin over hands and feet Severe or prolonged diarrhea Unusual bruising or bleeding Side effects that usually do not require medical attention (report to your care team if they continue or are bothersome): Dry skin Headache Increased tears Nausea Pain, redness, or swelling with sores inside the mouth or throat Sensitivity to  light Vomiting This list may not describe all possible side effects. Call your doctor for medical advice about side effects. You may report side effects to FDA at 1-800-FDA-1088. Where should I keep my medication? This medication is given in a hospital or clinic. It will not be stored at home. NOTE: This sheet is a summary. It may not cover all possible information. If you have questions about this medicine, talk to your doctor, pharmacist, or health care provider.  2024 Elsevier/Gold Standard (2021-10-21 00:00:00)       To help prevent nausea and vomiting after your treatment, we encourage you to take your nausea medication as directed.  BELOW ARE SYMPTOMS THAT SHOULD BE REPORTED IMMEDIATELY: *FEVER GREATER THAN 100.4 F (38 C) OR HIGHER *CHILLS OR SWEATING *NAUSEA AND VOMITING THAT IS NOT CONTROLLED WITH YOUR NAUSEA MEDICATION *UNUSUAL SHORTNESS OF BREATH *UNUSUAL BRUISING OR BLEEDING *URINARY PROBLEMS (pain or burning when urinating, or frequent urination) *BOWEL PROBLEMS (unusual diarrhea, constipation, pain near the anus) TENDERNESS IN MOUTH AND THROAT WITH OR WITHOUT PRESENCE OF ULCERS (sore throat, sores in mouth, or a toothache) UNUSUAL RASH, SWELLING OR PAIN  UNUSUAL VAGINAL DISCHARGE OR ITCHING   Items with * indicate a potential  emergency and should be followed up as soon as possible or go to the Emergency Department if any problems should occur.  Please show the CHEMOTHERAPY ALERT CARD or IMMUNOTHERAPY ALERT CARD at check-in to the Emergency Department and triage nurse.  Should you have questions after your visit or need to cancel or reschedule your appointment, please contact [facility] [place] - A DEPT OF [facility] [phone]  and follow the prompts.  Office hours are 8:00 a.m. to 4:30 p.m. Monday - Friday. Please note that voicemails left after 4:00 p.m. may not be returned until the following business day.  We are closed weekends and major holidays. You have access to a nurse at all times for urgent questions. Please call the main number to the clinic [phone] and follow the prompts.  For any non-urgent questions, you may also contact your provider using MyChart. We now offer e-Visits for anyone 73 and older to request care online for non-urgent symptoms. For details visit [email or web address].   Also download the MyChart app! Go to the app store, search "MyChart", open the app, select [place], and log in with your MyChart username and password.

## 2023-12-02 LAB — CEA: CEA: 8.5 ng/mL — ABNORMAL HIGH (ref 0.0–4.7)

## 2023-12-03 ENCOUNTER — Inpatient Hospital Stay

## 2023-12-03 VITALS — BP 139/68 | HR 68 | Temp 98.4°F | Resp 20

## 2023-12-03 DIAGNOSIS — C787 Secondary malignant neoplasm of liver and intrahepatic bile duct: Secondary | ICD-10-CM

## 2023-12-03 DIAGNOSIS — Z5111 Encounter for antineoplastic chemotherapy: Secondary | ICD-10-CM | POA: Diagnosis not present

## 2023-12-03 MED ORDER — HEPARIN SOD (PORK) LOCK FLUSH 100 UNIT/ML IV SOLN
500.0000 [IU] | Freq: Once | INTRAVENOUS | Status: AC | PRN
  Administered 2023-12-03: 500 [IU]

## 2023-12-03 MED ORDER — PEGFILGRASTIM-JMDB 6 MG/0.6ML ~~LOC~~ SOSY
6.0000 mg | PREFILLED_SYRINGE | Freq: Once | SUBCUTANEOUS | Status: AC
  Administered 2023-12-03: 6 mg via SUBCUTANEOUS

## 2023-12-03 MED ORDER — SODIUM CHLORIDE 0.9% FLUSH
10.0000 mL | INTRAVENOUS | Status: DC | PRN
  Administered 2023-12-03: 10 mL

## 2023-12-03 NOTE — Progress Notes (Signed)
 Patient tolerated injection with no complaints voiced. Site clean and dry with no bruising or swelling noted at site. See MAR for details. Band aid applied.  Patient stable during and after injection.  Chemo pump disconnected. Port flushed with good blood return noted. No bruising or swelling at site. Bandaid applied and patient discharged in satisfactory condition. VVS stable with no signs or symptoms of distressed noted.

## 2023-12-03 NOTE — Patient Instructions (Signed)
 [facility] - A DEPT OF [facility]  Discharge Instructions: Thank you for choosing [facility] to provide your oncology and hematology care.  If you have a lab appointment with the Cancer Center - please note that after April 8th, 2024, all labs will be drawn in the cancer center.  You do not have to check in or register with the main entrance as you have in the past but will complete your check-in in the cancer center.  Wear comfortable clothing and clothing appropriate for easy access to any Portacath or PICC line.   We strive to give you quality time with your provider. You may need to reschedule your appointment if you arrive late (15 or more minutes).  Arriving late affects you and other patients whose appointments are after yours.  Also, if you miss three or more appointments without notifying the office, you may be dismissed from the clinic at the provider's discretion.      For prescription refill requests, have your pharmacy contact our office and allow 72 hours for refills to be completed.    Today you received the following pegfilgrastim , port flushed, return as scheduled.    To help prevent nausea and vomiting after your treatment, we encourage you to take your nausea medication as directed.  BELOW ARE SYMPTOMS THAT SHOULD BE REPORTED IMMEDIATELY: *FEVER GREATER THAN 100.4 F (38 C) OR HIGHER *CHILLS OR SWEATING *NAUSEA AND VOMITING THAT IS NOT CONTROLLED WITH YOUR NAUSEA MEDICATION *UNUSUAL SHORTNESS OF BREATH *UNUSUAL BRUISING OR BLEEDING *URINARY PROBLEMS (pain or burning when urinating, or frequent urination) *BOWEL PROBLEMS (unusual diarrhea, constipation, pain near the anus) TENDERNESS IN MOUTH AND THROAT WITH OR WITHOUT PRESENCE OF ULCERS (sore throat, sores in mouth, or a toothache) UNUSUAL RASH, SWELLING OR PAIN  UNUSUAL VAGINAL DISCHARGE OR ITCHING   Items with * indicate a potential emergency and should be followed up as  soon as possible or go to the Emergency Department if any problems should occur.  Please show the CHEMOTHERAPY ALERT CARD or IMMUNOTHERAPY ALERT CARD at check-in to the Emergency Department and triage nurse.  Should you have questions after your visit or need to cancel or reschedule your appointment, please contact [facility] [place] - A DEPT OF [facility] [phone]  and follow the prompts.  Office hours are 8:00 a.m. to 4:30 p.m. Monday - Friday. Please note that voicemails left after 4:00 p.m. may not be returned until the following business day.  We are closed weekends and major holidays. You have access to a nurse at all times for urgent questions. Please call the main number to the clinic [phone] and follow the prompts.  For any non-urgent questions, you may also contact your provider using MyChart. We now offer e-Visits for anyone 17 and older to request care online for non-urgent symptoms. For details visit [email or web address].   Also download the MyChart app! Go to the app store, search "MyChart", open the app, select [facility], and log in with your MyChart username and password.

## 2023-12-04 DIAGNOSIS — C20 Malignant neoplasm of rectum: Secondary | ICD-10-CM | POA: Diagnosis not present

## 2023-12-06 ENCOUNTER — Ambulatory Visit

## 2023-12-10 ENCOUNTER — Ambulatory Visit (HOSPITAL_COMMUNITY)
Admission: RE | Admit: 2023-12-10 | Discharge: 2023-12-10 | Disposition: A | Discharge status: Home / Self Care | Source: Ambulatory Visit | Attending: Hematology | Admitting: Hematology

## 2023-12-10 DIAGNOSIS — C787 Secondary malignant neoplasm of liver and intrahepatic bile duct: Secondary | ICD-10-CM | POA: Insufficient documentation

## 2023-12-10 DIAGNOSIS — K769 Liver disease, unspecified: Secondary | ICD-10-CM | POA: Diagnosis not present

## 2023-12-10 DIAGNOSIS — C189 Malignant neoplasm of colon, unspecified: Secondary | ICD-10-CM | POA: Insufficient documentation

## 2023-12-10 DIAGNOSIS — R161 Splenomegaly, not elsewhere classified: Secondary | ICD-10-CM | POA: Diagnosis not present

## 2023-12-10 DIAGNOSIS — I700 Atherosclerosis of aorta: Secondary | ICD-10-CM | POA: Diagnosis not present

## 2023-12-10 DIAGNOSIS — R918 Other nonspecific abnormal finding of lung field: Secondary | ICD-10-CM | POA: Diagnosis not present

## 2023-12-10 MED ORDER — IOHEXOL 9 MG/ML PO SOLN
500.0000 mL | ORAL | Status: AC
  Administered 2023-12-10: 1000 mL via ORAL

## 2023-12-10 MED ORDER — IOHEXOL 300 MG/ML  SOLN
100.0000 mL | Freq: Once | INTRAMUSCULAR | Status: AC | PRN
  Administered 2023-12-10: 100 mL via INTRAVENOUS

## 2023-12-14 NOTE — Progress Notes (Signed)
 [facility] [address], [place] [address]    Clinic Day:  12/15/2023  Referring physician: Paulett Boros, MD  Patient Care Team: Paulett Boros, MD as PCP - General (Hematology) Gerhard Knuckles, RN as Oncology Nurse Navigator (Oncology)   ASSESSMENT & PLAN:   Assessment: 1.  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 -CT chest on 05/02/2020 with no evidence of pulmonary metastatic disease. -CTAP on 05/07/2020 with multiple lesions throughout the liver, largest in the right lobe measuring 3.9 cm, lateral segment left lobe measuring 2.6 cm and inferior right lobe confluent lesion 4.2 x 3.7 cm.  No lymphadenopathy. -CEA on 05/02/2020-60.2. -FOLFOX and bevacizumab  started on 06/04/2020. 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 - FOLFOX with bevacizumab  from 03/10/2023 through 05/24/2023 with progression. - FOLFIRI and bevacizumab  started on 06/09/2023   2.  Iron deficiency anemia: -Feraheme  on 04/30/2020 and 05/06/2020.   3.  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 -Smoked for 3 to 4 years and quit. -Mother had cancer, type unknown.  Maternal uncle had lung cancer.  Maternal aunt had lung cancer.    Plan: 1.  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 - Since we added G-CSF on day 3 of last treatment, she developed flulike symptoms for 2 days.  She also had diarrhea which lasted 1 day after last treatment.  She reported decreased energy levels which lasted 1 week after last treatment.  She did not have any of the side effects after the previous treatment 4  weeks ago. - Labs today: Normal LFTs.  Alk phos stable at 171.  Creatinine normal.  CBC with worsened thrombocytopenia of 89.  White count is normal. - CEA is trending down to 8.5 since the start of this treatment in December from 27. - UA was negative for protein. - CT CAP (12/10/2023): Right lung suprahilar nodule increased in size due to 2 x 1.5 cm, previously 1.4 x 1 cm.  Also spiculated nodule of the superior segment of the right lower lobe measures 1.2 x 0.8 cm, previously 8-9 mm.  Rest of the lung nodules have been stable.  Multiple liver lesions have been stable.  Unchanged minimal peritoneal thickening/stranding.  Coarse contour of the liver, may reflect cirrhosis/pseudocirrhosis and splenomegaly 15.9 cm. - Overall she is getting good response with downtrending CEA levels except with 2 enlarging right lung nodules. - I have recommended radiation oncology consultation for their opinion on treating these 2 lung lesions with SBRT or SBRT like therapy. - In the interim she will continue FOLFIRI and bevacizumab  every 2 weeks.  I will see her back in 4 weeks for follow-up.    2.  Hypertension: - Continue Norvasc  10 mg daily and triamterene /HCTZ daily.  Blood pressure is 133/66.   3.  Severe hypokalemia: - Continue K-Dur 40 mill equivalents daily.  Potassium is 3.4.   4.  Peripheral neuropathy: - Numbness is stable in the fingertips.  Continue gabapentin  100 mg 3 times daily.   5.  Anxiety: - Continue Paxil  40 mg daily and Klonopin  1 mg 3 times daily.  Symptoms well-controlled.  6.  Newly diagnosed diabetes: - Continue glimepiride  1 mg tablet with breakfast daily.  Blood sugar  is better at 118 today.  Will check HbA1c today.    Orders Placed This Encounter  Procedures   Hemoglobin A1c      I,Helena R Teague,acting as a scribe for Paulett Boros, MD.,have documented all relevant documentation on the behalf of Paulett Boros, MD,as directed by  Paulett Boros, MD while  in the presence of Paulett Boros, MD.  I, Paulett Boros MD, have reviewed the above documentation for accuracy and completeness, and I agree with the above.     Paulett Boros, MD   6/18/202512:58 PM  CHIEF COMPLAINT:   Diagnosis: metastatic colon cancer to liver    Cancer Staging  No matching staging information was found for the patient.    Prior Therapy: 1. Sigmoid colectomy and end colostomy on 04/29/2020  2. FOLFOX with bevacizumab , 06/04/20 - 11/06/20 3. Maintenance 5-FU and bevacizumab , 11/20/20 - 02/17/23 4. FOLFOX with bevacizumab , 03/10/23 - 05/24/23  Current Therapy:  FOLFIRI with bevacizumab     HISTORY OF PRESENT ILLNESS:   Oncology History  Metastatic colon cancer to liver (HCC)  05/16/2020 Initial Diagnosis   Metastatic colon cancer to liver (HCC)   06/03/2020 Genetic Testing   Foundation One:     06/04/2020 - 02/25/2022 Chemotherapy   Patient is on Treatment Plan : COLORECTAL FOLFOX + Bevacizumab  q14d     06/04/2020 - 05/26/2023 Chemotherapy   Patient is on Treatment Plan : COLORECTAL FOLFOX + Bevacizumab  q14d     06/09/2023 -  Chemotherapy   Patient is on Treatment Plan : COLORECTAL FOLFIRI + Bevacizumab  q14d        INTERVAL HISTORY:   Susan Martin is a 64 y.o. female presenting to clinic today for follow up of metastatic colon cancer to liver. She was last seen by me on 11/17/23.  Since her last visit, she underwent CT CAP on 12/10/23 that found: Numerous bilateral pulmonary nodules. Some of these are slightly increased in size, consistent with slight worsening of pulmonary metastatic disease. Other nodules not appreciably changed.  Multiple hypodense liver lesions are not significantly changed in size, however several demonstrate increased faint internal hypodensity consistent with calcific treatment response. Unchanged minimal peritoneal thickening and peritoneal and omental stranding throughout the abdomen and pelvis. Unchanged trace perihepatic  ascites. Coarse contour of the liver, which may reflect cirrhosis and/or pseudocirrhosis. Splenomegaly. Status post sigmoid colon resection with left lower quadrant end colostomy.  Today, she states that she is doing well overall. Her appetite level is at 75%. Her energy level is at 50%.  PAST MEDICAL HISTORY:   Past Medical History: Past Medical History:  Diagnosis Date   Adenocarcinoma of colon metastatic to liver [facility]) onocology--- dr s. Cheree Cords   04-29-2020 emergerency surgery for perforated colon s/p sigmoid colectomy w/ colostomy; dx  Stage IV colon cancer mets to liver   Anxiety    Depression    Hypertension    followed by dr Monty App and oncology  (05-27-2020 per pt does not have pcp yet)   IDA (iron deficiency anemia)    Pulmonary nodule, right     Surgical History: Past Surgical History:  Procedure Laterality Date   ABDOMINAL HYSTERECTOMY  03-31-2016   @[facility]    W/  BILATERAL SALPINOOPHORECTOMY    APPLICATION OF WOUND VAC N/A 04/29/2020   Procedure: APPLICATION OF WOUND VAC;  Surgeon: Derral Flick, MD;  Location: [facility] OR;  Service: General;  Laterality: N/A;   ENDOMETRIAL ABLATION     LAPAROTOMY N/A 04/29/2020   Procedure: EXPLORATORY LAPAROTOMY;  Surgeon: Harman Lightning  Thurston Flow, MD;  Location: [facility] OR;  Service: General;  Laterality: N/A;   PARTIAL COLECTOMY N/A 04/29/2020   Procedure: PARTIAL COLECTOMY WITH END COLOSTOMY;  Surgeon: Dorrie Gaudier Alphonso Aschoff, MD;  Location: [facility] OR;  Service: General;  Laterality: N/A;   PORTACATH PLACEMENT Right 05/28/2020   Procedure: INSERTION PORT-A-CATH;  Surgeon: Dorrie Gaudier Alphonso Aschoff, MD;  Location: [facility];  Service: General;  Laterality: Right;   SALPINGOOPHORECTOMY Left 03/31/2016   Procedure: LEFT SALPINGO OOPHORECTOMY WITH FROZEN SECTION,  ABDOMINAL HYSTERECTOMY WITH BILATERAL SALPINGO-OOPHORECTOMY;  Surgeon: Albino Hum, MD;  Location: [facility] ORS;  Service: Gynecology;  Laterality: Left;    Social  History: Social History   Socioeconomic History   Marital status: Divorced    Spouse name: Not on file   Number of children: Not on file   Years of education: Not on file   Highest education level: Not on file  Occupational History   Not on file  Tobacco Use   Smoking status: Former    Current packs/day: 0.00    Average packs/day: 0.5 packs/day for 5.0 years (2.5 ttl pk-yrs)    Types: Cigarettes    Start date: 03/27/2010    Quit date: 03/28/2015    Years since quitting: 8.7   Smokeless tobacco: Never  Vaping Use   Vaping status: Never Used  Substance and Sexual Activity   Alcohol use: No   Drug use: Never   Sexual activity: Yes    Partners: Male    Birth control/protection: Surgical  Other Topics Concern   Not on file  Social History Narrative   Not on file   Social Drivers of Health   Financial Resource Strain: Low Risk  (11/01/2019)   Overall Financial Resource Strain (CARDIA)    Difficulty of Paying Living Expenses: Not very hard  Food Insecurity: No Food Insecurity (11/01/2019)   Hunger Vital Sign    Worried About Running Out of Food in the Last Year: Never true    Ran Out of Food in the Last Year: Never true  Transportation Needs: No Transportation Needs (11/01/2019)   PRAPARE - Administrator, Civil Service (Medical): No    Lack of Transportation (Non-Medical): No  Physical Activity: Insufficiently Active (11/01/2019)   Exercise Vital Sign    Days of Exercise per Week: 2 days    Minutes of Exercise per Session: 30 min  Stress: Stress Concern Present (11/01/2019)   [employer] of Occupational Health - Occupational Stress Questionnaire    Feeling of Stress : To some extent  Social Connections: Moderately Integrated (11/01/2019)   Social Connection and Isolation Panel    Frequency of Communication with Friends and Family: More than three times a week    Frequency of Social Gatherings with Friends and Family: Once a week    Attends Religious Services:  More than 4 times per year    Active Member of [employer] or Organizations: No    Attends Banker Meetings: Never    Marital Status: Living with partner  Intimate Partner Violence: Not At Risk (11/01/2019)   Humiliation, Afraid, Rape, and Kick questionnaire    Fear of Current or Ex-Partner: No    Emotionally Abused: No    Physically Abused: No    Sexually Abused: No    Family History: Family History  Problem Relation Age of Onset   Hypertension Mother    Diabetes Mother    Cirrhosis Father     Current Medications:  Current Outpatient Medications:  amLODipine  (NORVASC ) 10 MG tablet, TAKE 1 TABLET BY MOUTH EVERY DAY, Disp: 90 tablet, Rfl: 2   amLODipine  (NORVASC ) 5 MG tablet, TAKE 1 TABLET (5 MG TOTAL) BY MOUTH DAILY., Disp: 90 tablet, Rfl: 1   BEVACIZUMAB  IV, Inject 5 mg/kg into the vein every 14 (fourteen) days., Disp: , Rfl:    blood glucose meter kit and supplies KIT, Dispense based on patient and insurance preference. Use up to four times daily as directed., Disp: 1 each, Rfl: 6   clonazePAM  (KLONOPIN ) 1 MG tablet, Take 1 tablet (1 mg total) by mouth in the morning, at noon, and at bedtime., Disp: 90 tablet, Rfl: 3   fluorouracil  CALGB [address] in sodium chloride  0.9 % 150 mL, Inject 2,400 mg/m2 into the vein over 48 hr., Disp: , Rfl:    gabapentin  (NEURONTIN ) 100 MG capsule, TAKE 1 CAPSULE (100 MG TOTAL) BY MOUTH 3 TIMES A DAY, Disp: 90 capsule, Rfl: 3   glimepiride  (AMARYL ) 1 MG tablet, TAKE 1 TABLET BY MOUTH EVERY DAY WITH BREAKFAST, Disp: 90 tablet, Rfl: 1   Lancets (ONETOUCH DELICA PLUS LANCET33G) MISC, Apply topically., Disp: , Rfl:    LEUCOVORIN  CALCIUM  IV, Inject 400 mg/m2 into the vein every 21 ( twenty-one) days., Disp: , Rfl:    lidocaine  (XYLOCAINE ) 2 % solution, Use as directed 15 mLs in the mouth or throat every 6 (six) hours as needed for mouth pain. Swish and spit/swallow every six hours as needed for mouth pain, Disp: 480 mL, Rfl: 3   lidocaine -prilocaine   (EMLA ) cream, Apply 1 Application topically as needed., Disp: 30 g, Rfl: 0   Misc. Devices MISC, Please provide with item number [id number] 2 3/4 [employer] Ostomy bags and item number [id number] Fl[place] 2 3/4, Disp: 5 Box, Rfl: 12   nystatin (MYCOSTATIN) 100000 UNIT/ML suspension, Take by mouth., Disp: , Rfl:    ONETOUCH ULTRA test strip, Use as instructed, Disp: 100 each, Rfl: 6   OXALIPLATIN  IV, Inject into the vein every 14 (fourteen) days., Disp: , Rfl:    oxyCODONE  (OXY IR/ROXICODONE ) 5 MG immediate release tablet, Take 5 mg by mouth every 6 (six) hours as needed. for pain, Disp: , Rfl:    PARoxetine  (PAXIL ) 40 MG tablet, Take 1 tablet (40 mg total) by mouth every morning., Disp: 90 tablet, Rfl: 3   polyethylene glycol (MIRALAX  / GLYCOLAX ) packet, Take 17 g by mouth every other day., Disp: , Rfl:    potassium chloride  (KLOR-CON  M10) 10 MEQ tablet, Take 2 tablets (20 mEq total) by mouth daily., Disp: 180 tablet, Rfl: 3   triamterene -hydrochlorothiazide  (MAXZIDE -25) 37.5-25 MG tablet, TAKE 1 TABLET BY MOUTH EVERY DAY, Disp: 90 tablet, Rfl: 1 No current facility-administered medications for this visit.  Facility-Administered Medications Ordered in Other Visits:    0.9 %  sodium chloride  infusion, , Intravenous, Continuous, Paulett Boros, MD, Last Rate: 10 mL/hr at 12/15/23 1031, New Bag at 12/15/23 1031   fluorouracil  (ADRUCIL ) 3,500 mg in sodium chloride  0.9 % 80 mL chemo infusion, 1,920 mg/m2 (Order-Specific), Intravenous, 1 day or 1 dose, Jaelon Gatley, MD   fluorouracil  (ADRUCIL ) chemo injection 600 mg, 320 mg/m2 (Order-Specific), Intravenous, Once, Paulett Boros, MD   irinotecan  (CAMPTOSAR ) 300 mg in sodium chloride  0.9 % 500 mL chemo infusion, 144 mg/m2 (Order-Specific), Intravenous, Once, Lynel Forester, MD, Last Rate: 343 mL/hr at 12/15/23 1128, 300 mg at 12/15/23 1128   potassium chloride  SA (KLOR-CON  M) CR tablet 40 mEq, 40 mEq, Oral, Once, Paulett Boros, MD    Allergies:  No Known Allergies  REVIEW OF SYSTEMS:   Review of Systems  Constitutional:  Negative for chills, fatigue and fever.  HENT:   Negative for lump/mass, mouth sores, nosebleeds, sore throat and trouble swallowing.   Eyes:  Negative for eye problems.  Respiratory:  Negative for cough and shortness of breath.   Cardiovascular:  Negative for chest pain, leg swelling and palpitations.  Gastrointestinal:  Positive for diarrhea. Negative for abdominal pain, constipation, nausea and vomiting.  Genitourinary:  Negative for bladder incontinence, difficulty urinating, dysuria, frequency, hematuria and nocturia.   Musculoskeletal:  Negative for arthralgias, back pain, flank pain, myalgias and neck pain.  Skin:  Negative for itching and rash.  Neurological:  Positive for numbness. Negative for dizziness and headaches.  Hematological:  Does not bruise/bleed easily.  Psychiatric/Behavioral:  Positive for sleep disturbance. Negative for depression and suicidal ideas. The patient is nervous/anxious.   All other systems reviewed and are negative.    VITALS:   There were no vitals taken for this visit.  Wt Readings from Last 3 Encounters:  12/15/23 170 lb 6.4 oz (77.3 kg)  12/01/23 168 lb 12.8 oz (76.6 kg)  11/17/23 169 lb 15.6 oz (77.1 kg)    There is no height or weight on file to calculate BMI.  Performance status (ECOG): 1 - Symptomatic but completely ambulatory  PHYSICAL EXAM:   Physical Exam Vitals and nursing note reviewed. Exam conducted with a chaperone present.  Constitutional:      Appearance: Normal appearance.   Cardiovascular:     Rate and Rhythm: Normal rate and regular rhythm.     Pulses: Normal pulses.     Heart sounds: Normal heart sounds.  Pulmonary:     Effort: Pulmonary effort is normal.     Breath sounds: Normal breath sounds.  Abdominal:     Palpations: Abdomen is soft. There is no hepatomegaly, splenomegaly or mass.     Tenderness: There is no  abdominal tenderness.   Musculoskeletal:     Right lower leg: No edema.     Left lower leg: No edema.  Lymphadenopathy:     Cervical: No cervical adenopathy.     Right cervical: No superficial, deep or posterior cervical adenopathy.    Left cervical: No superficial, deep or posterior cervical adenopathy.     Upper Body:     Right upper body: No supraclavicular or axillary adenopathy.     Left upper body: No supraclavicular or axillary adenopathy.   Neurological:     General: No focal deficit present.     Mental Status: She is alert and oriented to person, place, and time.   Psychiatric:        Mood and Affect: Mood normal.        Behavior: Behavior normal.     LABS:   CBC     Component Value Date/Time   WBC 7.1 12/15/2023 0913   RBC 3.63 (L) 12/15/2023 0913   HGB 10.9 (L) 12/15/2023 0913   HGB 14.9 03/05/2016 1201   HCT 34.3 (L) 12/15/2023 0913   HCT 43.4 03/05/2016 1201   PLT 89 (L) 12/15/2023 0913   PLT 340 03/05/2016 1201   MCV 94.5 12/15/2023 0913   MCV 90 03/05/2016 1201   MCH 30.0 12/15/2023 0913   MCHC 31.8 12/15/2023 0913   RDW 19.1 (H) 12/15/2023 0913   RDW 12.8 03/05/2016 1201   LYMPHSABS 1.6 12/15/2023 0913   LYMPHSABS 1.0 03/05/2016 1201   MONOABS 0.8 12/15/2023 0913  EOSABS 0.2 12/15/2023 0913   EOSABS 0.0 03/05/2016 1201   BASOSABS 0.1 12/15/2023 0913   BASOSABS 0.0 03/05/2016 1201    CMP      Component Value Date/Time   NA 137 12/15/2023 0913   NA 140 03/05/2016 1201   K 3.4 (L) 12/15/2023 0913   CL 102 12/15/2023 0913   CO2 24 12/15/2023 0913   GLUCOSE 114 (H) 12/15/2023 0913   BUN 10 12/15/2023 0913   BUN 10 03/05/2016 1201   CREATININE 0.84 12/15/2023 0913   CALCIUM  9.0 12/15/2023 0913   PROT 6.8 12/15/2023 0913   PROT 7.4 03/05/2016 1201   ALBUMIN 3.4 (L) 12/15/2023 0913   ALBUMIN 4.0 03/05/2016 1201   AST 39 12/15/2023 0913   ALT 29 12/15/2023 0913   ALKPHOS 171 (H) 12/15/2023 0913   BILITOT 0.8 12/15/2023 0913   BILITOT  0.3 03/05/2016 1201   GFRNONAA >60 12/15/2023 0913   GFRAA >60 04/01/2016 0541     Lab Results  Component Value Date   CEA1 8.5 (H) 12/01/2023   /  CEA  Date Value Ref Range Status  12/01/2023 8.5 (H) 0.0 - 4.7 ng/mL Final    Comment:    (NOTE)                             Nonsmokers          <3.9                             Smokers             <5.6 Roche [employer] Electrochemiluminescence Immunoassay (ECLIA) Values obtained with different assay methods or kits cannot be used interchangeably.  Results cannot be interpreted as absolute evidence of the presence or absence of malignant disease. Performed At: [facility] [address] [place], [place] [id number] Pearlean Botts MD WU:[id number]    No results found for: PSA1 No results found for: NFA213 No results found for: CAN125  No results found for: TOTALPROTELP, ALBUMINELP, A1GS, A2GS, BETS, BETA2SER, GAMS, MSPIKE, SPEI Lab Results  Component Value Date   TIBC 248 (L) 04/29/2020   FERRITIN 57 04/29/2020   IRONPCTSAT 3 (L) 04/29/2020   No results found for: LDH   STUDIES:   CT CHEST ABDOMEN PELVIS W CONTRAST Result Date: 12/12/2023 CLINICAL DATA:  Metastatic colon cancer restaging * Tracking Code: BO * EXAM: CT CHEST, ABDOMEN, AND PELVIS WITH CONTRAST TECHNIQUE: Multidetector CT imaging of the chest, abdomen and pelvis was performed following the standard protocol during bolus administration of intravenous contrast. RADIATION DOSE REDUCTION: This exam was performed according to the departmental dose-optimization program which includes automated exposure control, adjustment of the mA and/or kV according to patient size and/or use of iterative reconstruction technique. CONTRAST:  OMNIPAQUE  IOHEXOL  300 MG/ML  SOLN COMPARISON:  09/01/2023 FINDINGS: CT CHEST FINDINGS Cardiovascular: Right chest port catheter. Normal heart size. No pericardial effusion. Mediastinum/Nodes: No enlarged  mediastinal, hilar, or axillary lymph nodes. Thyroid gland, trachea, and esophagus demonstrate no significant findings. Lungs/Pleura: Numerous bilateral pulmonary nodules. Some of these are slightly increased in size, for example a spiculated nodule of the superior segment right lower lobe measuring 1.2 x 0.8 cm, previously 0.8 cm (series 4, image 69) and a suprahilar nodule of the right upper lobe measuring 2.0 x 1.5 cm, previously 1.4 x 1.0 cm (series 2, image 26). Other nodules not appreciably  changed. No pleural effusion or pneumothorax. Musculoskeletal: No chest wall abnormality. No acute osseous findings. CT ABDOMEN PELVIS FINDINGS Hepatobiliary: Coarse contour of the liver. Multiple hypodense liver lesions are not significantly changed in size, however several demonstrate increased faint internal hypodensity consistent calcific treatment response. Index lesion in the anterior left lobe of the liver with overlying capsular retraction measures 1.3 x 1.2 cm (series 2, image 67). No gallstones, gallbladder wall thickening, or biliary dilatation. Pancreas: Unremarkable. No pancreatic ductal dilatation or surrounding inflammatory changes. Spleen: Splenomegaly, maximum coronal span 15.9 cm. Adrenals/Urinary Tract: Adrenal glands are unremarkable. Kidneys are normal, without renal calculi, solid lesion, or hydronephrosis. Bladder is unremarkable. Stomach/Bowel: Stomach is within normal limits. Appendix appears normal. No evidence of bowel wall thickening, distention, or inflammatory changes. Hartmann procedure sigmoid colon resection with left lower quadrant end colostomy. Vascular/Lymphatic: Aortic atherosclerosis. No enlarged abdominal or pelvic lymph nodes. Reproductive: Status post hysterectomy. Other: Left lower quadrant end colostomy with fat containing parastomal hernia. Unchanged trace perihepatic ascites. Unchanged minimal peritoneal thickening and peritoneal and omental stranding throughout the abdomen and  pelvis. Musculoskeletal: No acute osseous findings. IMPRESSION: 1. Numerous bilateral pulmonary nodules. Some of these are slightly increased in size, consistent with slight worsening of pulmonary metastatic disease. Other nodules not appreciably changed. 2. Multiple hypodense liver lesions are not significantly changed in size, however several demonstrate increased faint internal hypodensity consistent with calcific treatment response. 3. Unchanged minimal peritoneal thickening and peritoneal and omental stranding throughout the abdomen and pelvis. Unchanged trace perihepatic ascites. 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 4. Coarse contour of the liver, which may reflect cirrhosis and/or pseudocirrhosis. 5. Splenomegaly. 6. Status post sigmoid colon resection with left lower quadrant end colostomy. Aortic Atherosclerosis (ICD10-I70.0). Electronically Signed   By: Fredricka Jenny M.D.   On: 12/12/2023 13:37

## 2023-12-15 ENCOUNTER — Inpatient Hospital Stay

## 2023-12-15 ENCOUNTER — Encounter: Payer: Self-pay | Admitting: Hematology

## 2023-12-15 ENCOUNTER — Inpatient Hospital Stay (HOSPITAL_BASED_OUTPATIENT_CLINIC_OR_DEPARTMENT_OTHER): Admitting: Hematology

## 2023-12-15 VITALS — BP 121/71 | HR 76 | Temp 97.8°F | Resp 18

## 2023-12-15 DIAGNOSIS — Z5111 Encounter for antineoplastic chemotherapy: Secondary | ICD-10-CM | POA: Diagnosis not present

## 2023-12-15 DIAGNOSIS — C189 Malignant neoplasm of colon, unspecified: Secondary | ICD-10-CM

## 2023-12-15 DIAGNOSIS — E119 Type 2 diabetes mellitus without complications: Secondary | ICD-10-CM | POA: Diagnosis not present

## 2023-12-15 LAB — HEMOGLOBIN A1C
Hgb A1c MFr Bld: 5.2 % (ref 4.8–5.6)
Mean Plasma Glucose: 102.54 mg/dL

## 2023-12-15 LAB — COMPREHENSIVE METABOLIC PANEL WITH GFR
ALT: 29 U/L (ref 0–44)
AST: 39 U/L (ref 15–41)
Albumin: 3.4 g/dL — ABNORMAL LOW (ref 3.5–5.0)
Alkaline Phosphatase: 171 U/L — ABNORMAL HIGH (ref 38–126)
Anion gap: 11 (ref 5–15)
BUN: 10 mg/dL (ref 8–23)
CO2: 24 mmol/L (ref 22–32)
Calcium: 9 mg/dL (ref 8.9–10.3)
Chloride: 102 mmol/L (ref 98–111)
Creatinine, Ser: 0.84 mg/dL (ref 0.44–1.00)
GFR, Estimated: >60 mL/min (ref >60)
Glucose, Bld: 114 mg/dL — ABNORMAL HIGH (ref 70–99)
Potassium: 3.4 mmol/L — ABNORMAL LOW (ref 3.5–5.1)
Sodium: 137 mmol/L (ref 135–145)
Total Bilirubin: 0.8 mg/dL (ref 0.0–1.2)
Total Protein: 6.8 g/dL (ref 6.5–8.1)

## 2023-12-15 LAB — CBC WITH DIFFERENTIAL/PLATELET
Abs Immature Granulocytes: 0.08 10*3/uL — ABNORMAL HIGH (ref 0.00–0.07)
Basophils Absolute: 0.1 10*3/uL (ref 0.0–0.1)
Basophils Relative: 1 %
Eosinophils Absolute: 0.2 10*3/uL (ref 0.0–0.5)
Eosinophils Relative: 3 %
HCT: 34.3 % — ABNORMAL LOW (ref 36.0–46.0)
Hemoglobin: 10.9 g/dL — ABNORMAL LOW (ref 12.0–15.0)
Immature Granulocytes: 1 %
Lymphocytes Relative: 22 %
Lymphs Abs: 1.6 10*3/uL (ref 0.7–4.0)
MCH: 30 pg (ref 26.0–34.0)
MCHC: 31.8 g/dL (ref 30.0–36.0)
MCV: 94.5 fL (ref 80.0–100.0)
Monocytes Absolute: 0.8 10*3/uL (ref 0.1–1.0)
Monocytes Relative: 11 %
Neutro Abs: 4.5 10*3/uL (ref 1.7–7.7)
Neutrophils Relative %: 62 %
Platelets: 89 10*3/uL — ABNORMAL LOW (ref 150–400)
RBC: 3.63 MIL/uL — ABNORMAL LOW (ref 3.87–5.11)
RDW: 19.1 % — ABNORMAL HIGH (ref 11.5–15.5)
WBC: 7.1 10*3/uL (ref 4.0–10.5)
nRBC: 0 % (ref 0.0–0.2)

## 2023-12-15 LAB — URINALYSIS, DIPSTICK ONLY
Bilirubin Urine: NEGATIVE
Glucose, UA: NEGATIVE mg/dL
Hgb urine dipstick: NEGATIVE
Ketones, ur: NEGATIVE mg/dL
Leukocytes,Ua: NEGATIVE
Nitrite: NEGATIVE
Protein, ur: NEGATIVE mg/dL
Specific Gravity, Urine: 1.012 (ref 1.005–1.030)
pH: 6 (ref 5.0–8.0)

## 2023-12-15 LAB — MAGNESIUM: Magnesium: 2.1 mg/dL (ref 1.7–2.4)

## 2023-12-15 MED ORDER — ATROPINE SULFATE 1 MG/ML IV SOLN
0.5000 mg | Freq: Once | INTRAVENOUS | Status: AC
  Administered 2023-12-15: 0.5 mg via INTRAVENOUS
  Filled 2023-12-15: qty 1

## 2023-12-15 MED ORDER — SODIUM CHLORIDE 0.9% FLUSH
10.0000 mL | Freq: Once | INTRAVENOUS | Status: AC
  Administered 2023-12-15: 10 mL via INTRAVENOUS

## 2023-12-15 MED ORDER — POTASSIUM CHLORIDE CRYS ER 20 MEQ PO TBCR
40.0000 meq | EXTENDED_RELEASE_TABLET | Freq: Once | ORAL | Status: AC
  Administered 2023-12-15: 40 meq via ORAL
  Filled 2023-12-15: qty 2

## 2023-12-15 MED ORDER — SODIUM CHLORIDE 0.9 % IV SOLN: INTRAVENOUS | Status: DC

## 2023-12-15 MED ORDER — SODIUM CHLORIDE 0.9 % IV SOLN
320.0000 mg/m2 | Freq: Once | INTRAVENOUS | Status: AC
  Administered 2023-12-15: 608 mg via INTRAVENOUS
  Filled 2023-12-15: qty 30.4

## 2023-12-15 MED ORDER — PALONOSETRON HCL INJECTION 0.25 MG/5ML
0.2500 mg | Freq: Once | INTRAVENOUS | Status: AC
  Administered 2023-12-15: 0.25 mg via INTRAVENOUS
  Filled 2023-12-15: qty 5

## 2023-12-15 MED ORDER — FLUOROURACIL CHEMO INJECTION 2.5 GM/50ML
320.0000 mg/m2 | Freq: Once | INTRAVENOUS | Status: AC
  Administered 2023-12-15: 600 mg via INTRAVENOUS
  Filled 2023-12-15: qty 12

## 2023-12-15 MED ORDER — SODIUM CHLORIDE 0.9 % IV SOLN
1920.0000 mg/m2 | INTRAVENOUS | Status: DC
  Administered 2023-12-15: 3500 mg via INTRAVENOUS
  Filled 2023-12-15: qty 70

## 2023-12-15 MED ORDER — SODIUM CHLORIDE 0.9 % IV SOLN
144.0000 mg/m2 | Freq: Once | INTRAVENOUS | Status: AC
  Administered 2023-12-15: 300 mg via INTRAVENOUS
  Filled 2023-12-15: qty 15

## 2023-12-15 MED ORDER — DEXAMETHASONE SODIUM PHOSPHATE 10 MG/ML IJ SOLN
10.0000 mg | Freq: Once | INTRAMUSCULAR | Status: AC
  Administered 2023-12-15: 10 mg via INTRAVENOUS
  Filled 2023-12-15: qty 1

## 2023-12-15 MED ORDER — SODIUM CHLORIDE 0.9 % IV SOLN
5.0000 mg/kg | Freq: Once | INTRAVENOUS | Status: AC
  Administered 2023-12-15: 400 mg via INTRAVENOUS
  Filled 2023-12-15: qty 16

## 2023-12-15 NOTE — Patient Instructions (Signed)
 [facility] [place] - [facility] DE[facility]  Discharge Instructions: Thank you for choosing [facility] to provide your oncology and hematology care.  If you have a lab appointment with the Cancer Center - please note that after April 8th, 2024, all labs will be drawn in the cancer center.  You do not have to check in or register with the main entrance as you have in the past but will complete your check-in in the cancer center.  Wear comfortable clothing and clothing appropriate for easy access to any Portacath or PICC line.   We strive to give you quality time with your provider. You may need to reschedule your appointment if you arrive late (15 or more minutes).  Arriving late affects you and other patients whose appointments are after yours.  Also, if you miss three or more appointments without notifying the office, you may be dismissed from the clinic at the provider's discretion.      For prescription refill requests, have your pharmacy contact our office and allow 72 hours for refills to be completed.    Today you received the following chemotherapy and/or immunotherapy agents MVASI/Folfiri   To help prevent nausea and vomiting after your treatment, we encourage you to take your nausea medication as directed.   BELOW ARE SYMPTOMS THAT SHOULD BE REPORTED IMMEDIATELY: *FEVER GREATER THAN 100.4 F (38 C) OR HIGHER *CHILLS OR SWEATING *NAUSEA AND VOMITING THAT IS NOT CONTROLLED WITH YOUR NAUSEA MEDICATION *UNUSUAL SHORTNESS OF BREATH *UNUSUAL BRUISING OR BLEEDING *URINARY PROBLEMS (pain or burning when urinating, or frequent urination) *BOWEL PROBLEMS (unusual diarrhea, constipation, pain near the anus) TENDERNESS IN MOUTH AND THROAT WITH OR WITHOUT PRESENCE OF ULCERS (sore throat, sores in mouth, or a toothache) UNUSUAL RASH, SWELLING OR PAIN  UNUSUAL VAGINAL DISCHARGE OR ITCHING   Items with * indicate a potential emergency and should be followed  up as soon as possible or go to the Emergency Department if any problems should occur.  Please show the CHEMOTHERAPY ALERT CARD or IMMUNOTHERAPY ALERT CARD at check-in to the Emergency Department and triage nurse.  Should you have questions after your visit or need to cancel or reschedule your appointment, please contact [facility]ER [facility] [place] - A DEPT OF Eligha Bridegroom [facility] [phone]  and follow the prompts.  Office hours are 8:00 a.m. to 4:30 p.m. Monday - Friday. Please note that voicemails left after 4:00 p.m. may not be returned until the following business day.  We are closed weekends and major holidays. You have access to a nurse at all times for urgent questions. Please call the main number to the clinic [phone] and follow the prompts.  For any non-urgent questions, you may also contact your provider using MyChart. We now offer e-Visits for anyone 8 and older to request care online for non-urgent symptoms. For details visit [email or web address].   Also download the MyChart app! Go to the app store, search "MyChart", open the app, select [place], and log in with your MyChart username and password.

## 2023-12-15 NOTE — Patient Instructions (Addendum)
 [facility] at [facility] Discharge Instructions   You were seen and examined today by Dr. Cheree Cords.  He reviewed the results of your lab work which are normal/stable.   He reviewed the results of your CT scan. It is showing two spots in the right lung that have increased in size. The other spots in the lungs and the spots in the liver are stable. We will refer you to a radiation oncologist at [facility] to treat the two growing lung spots.   We will proceed with your treatment today.   Return as scheduled.    Thank you for choosing [facility] at [facility] to provide your oncology and hematology care.  To afford each patient quality time with our provider, please arrive at least 15 minutes before your scheduled appointment time.   If you have a lab appointment with the Cancer Center please come in thru the Main Entrance and check in at the main information desk.  You need to re-schedule your appointment should you arrive 10 or more minutes late.  We strive to give you quality time with our providers, and arriving late affects you and other patients whose appointments are after yours.  Also, if you no show three or more times for appointments you may be dismissed from the clinic at the providers discretion.     Again, thank you for choosing [facility].  Our hope is that these requests will decrease the amount of time that you wait before being seen by our physicians.       _____________________________________________________________  Should you have questions after your visit to [facility], please contact our office at [phone] and follow the prompts.  Our office hours are 8:00 a.m. and 4:30 p.m. Monday - Friday.  Please note that voicemails left after 4:00 p.m. may not be returned until the following business day.  We are closed weekends and major holidays.  You do have access to a nurse 24-7, just call  the main number to the clinic [phone] and do not press any options, hold on the line and a nurse will answer the phone.    For prescription refill requests, have your pharmacy contact our office and allow 72 hours.    Due to Covid, you will need to wear a mask upon entering the hospital. If you do not have a mask, a mask will be given to you at the Main Entrance upon arrival. For doctor visits, patients may have 1 support person age 63 or older with them. For treatment visits, patients can not have anyone with them due to social distancing guidelines and our immunocompromised population.

## 2023-12-15 NOTE — Progress Notes (Signed)
 Patient presents today for MVASI /Folfiri infusion. Patient is in satisfactory condition with no new complaints voiced.  Vital signs are stable.  Labs reviewed by Dr. Cheree Cords during the office visit and all labs are within treatment parameters.  We will proceed with treatment per MD orders.   Treatment given today per MD orders. Tolerated infusion without adverse affects. Vital signs stable. No complaints at this time. Discharged from clinic ambulatory in stable condition. Alert and oriented x 3. F/U with [facility] as scheduled. 5FU ambulatory pump infusing  with no alarms beeping.

## 2023-12-15 NOTE — Progress Notes (Signed)
 Patient has been examined by Dr. Cheree Cords. Vital signs and labs have been reviewed by MD - ANC, Creatinine, LFTs, hemoglobin, and platelets (89) are within treatment parameters per M.D. - pt may proceed with treatment.  Primary RN and pharmacy notified.

## 2023-12-16 LAB — CEA: CEA: 8.4 ng/mL — ABNORMAL HIGH (ref 0.0–4.7)

## 2023-12-17 ENCOUNTER — Inpatient Hospital Stay

## 2023-12-17 VITALS — BP 134/75 | HR 68 | Temp 97.4°F | Resp 18

## 2023-12-17 DIAGNOSIS — C787 Secondary malignant neoplasm of liver and intrahepatic bile duct: Secondary | ICD-10-CM

## 2023-12-17 DIAGNOSIS — Z5111 Encounter for antineoplastic chemotherapy: Secondary | ICD-10-CM | POA: Diagnosis not present

## 2023-12-17 DIAGNOSIS — C189 Malignant neoplasm of colon, unspecified: Secondary | ICD-10-CM

## 2023-12-17 MED ORDER — HEPARIN SOD (PORK) LOCK FLUSH 100 UNIT/ML IV SOLN
500.0000 [IU] | Freq: Once | INTRAVENOUS | Status: AC | PRN
  Administered 2023-12-17: 500 [IU]

## 2023-12-17 MED ORDER — SODIUM CHLORIDE 0.9% FLUSH
10.0000 mL | INTRAVENOUS | Status: DC | PRN
  Administered 2023-12-17: 10 mL

## 2023-12-17 MED ORDER — PEGFILGRASTIM-JMDB 6 MG/0.6ML ~~LOC~~ SOSY
6.0000 mg | PREFILLED_SYRINGE | Freq: Once | SUBCUTANEOUS | Status: AC
  Administered 2023-12-17: 6 mg via SUBCUTANEOUS
  Filled 2023-12-17: qty 0.6

## 2023-12-17 NOTE — Progress Notes (Signed)
 Patient for chemotherapy pump disconnect with no complaints voiced.  Patients port flushed without difficulty.  Good blood return noted with no bruising or swelling noted at site.  Band aid applied.  VSS with discharge and left ambulatory with no s/s of distress noted.

## 2023-12-17 NOTE — Patient Instructions (Signed)

## 2023-12-22 ENCOUNTER — Ambulatory Visit
Admission: RE | Admit: 2023-12-22 | Discharge: 2023-12-22 | Disposition: A | Discharge status: Home / Self Care | Source: Ambulatory Visit | Attending: Radiation Oncology | Admitting: Radiation Oncology

## 2023-12-22 ENCOUNTER — Encounter: Payer: Self-pay | Admitting: Radiation Oncology

## 2023-12-22 VITALS — BP 132/86 | HR 71 | Resp 16 | Wt 166.0 lb

## 2023-12-22 DIAGNOSIS — C187 Malignant neoplasm of sigmoid colon: Secondary | ICD-10-CM | POA: Diagnosis not present

## 2023-12-22 DIAGNOSIS — R918 Other nonspecific abnormal finding of lung field: Secondary | ICD-10-CM

## 2023-12-22 DIAGNOSIS — C7801 Secondary malignant neoplasm of right lung: Secondary | ICD-10-CM | POA: Diagnosis not present

## 2023-12-22 NOTE — Consult Note (Signed)
 NEW PATIENT EVALUATION  Name: Susan Martin  MRN: [id number]  Date:   12/22/2023     DOB: 04/15/1961   This 63 y.o. 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   PREVIOUS INVESTIGATIONS:  CT scans reviewed Clinical notes reviewed Pathology report reviewed  HPI: Patient is a 63 year old female whose history dates back to 2021 when she had a sigmoid colectomy for a pathologic pT4a N1c adenocarcinoma.  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  She went on to have biopsy-proven adenocarcinoma metastatic to her liver in 59.  She had been treated with FOLFOX and bevacizumab  as well as maintenance 5-FU.  She currently is on FOLFIRI and bevacizumab .  Recently CT scan this month showed right lung suprahilar nodule increasing in size from 2 x 1.5 cm previously 1.4 x 1 cm as well as a spiculated nodule superior segment the right lower lobe measuring 1.2 x 0.8 cm previously 8 to 9 mm.  Rest of her lung nodules are stable.  Her liver lesions are stable also.  She is referred to radiation oncology for consideration of SBRT treatment to the 2 right lung lesions which are progressing.  She has no significant cough hemoptysis or chest tightness.  PLANNED TREATMENT REGIMEN: SBRT to both lesions.  PAST MEDICAL HISTORY:  has a past medical history of Adenocarcinoma of colon metastatic to liver [facility]) (onocology--- dr s. rogers), Anxiety, Depression, Hypertension, IDA (iron deficiency anemia), and Pulmonary nodule, right.    PAST SURGICAL HISTORY:  Past Surgical History:  Procedure Laterality Date   ABDOMINAL HYSTERECTOMY  03-31-2016   @[facility]    W/   BILATERAL SALPINOOPHORECTOMY    APPLICATION OF WOUND VAC N/A 04/29/2020   Procedure: APPLICATION OF WOUND VAC;  Surgeon: Stevie Herlene Righter, MD;  Location: [facility] OR;  Service: General;  Laterality: N/A;   ENDOMETRIAL ABLATION     LAPAROTOMY N/A 04/29/2020   Procedure: EXPLORATORY LAPAROTOMY;  Surgeon: Stevie Herlene Righter, MD;  Location: [facility] OR;  Service: General;  Laterality: N/A;   PARTIAL COLECTOMY N/A 04/29/2020   Procedure: PARTIAL COLECTOMY WITH END COLOSTOMY;  Surgeon: Stevie Herlene Righter, MD;  Location: [facility] OR;  Service: General;  Laterality: N/A;   PORTACATH PLACEMENT Right 05/28/2020   Procedure: INSERTION PORT-A-CATH;  Surgeon: Kinsinger, Herlene Righter, MD;  Location: [facility];  Service: General;  Laterality: Right;   SALPINGOOPHORECTOMY Left 03/31/2016   Procedure: LEFT SALPINGO OOPHORECTOMY WITH FROZEN SECTION,  ABDOMINAL HYSTERECTOMY WITH BILATERAL SALPINGO-OOPHORECTOMY;  Surgeon: Norleen LULLA Server, MD;  Location: [facility];  Service: Gynecology;  Laterality: Left;    FAMILY HISTORY: family history includes Cirrhosis in her father; Diabetes in her mother; Hypertension in her mother.  SOCIAL HISTORY:  reports that she quit smoking about 8 years ago. Her smoking use included cigarettes. She started smoking about 13 years ago. She has a 2.5 pack-year smoking history. She has never used smokeless tobacco. She reports that she does not drink alcohol and does not use drugs.  ALLERGIES: Patient has no known allergies.  MEDICATIONS:  Current Outpatient Medications  Medication Sig Dispense Refill   amLODipine  (NORVASC ) 10 MG tablet TAKE 1 TABLET BY MOUTH EVERY DAY  90 tablet 2   amLODipine  (NORVASC ) 5 MG tablet TAKE 1 TABLET (5 MG TOTAL) BY MOUTH DAILY. 90 tablet 1   BEVACIZUMAB  IV Inject 5 mg/kg into the vein every 14 (fourteen) days.     blood glucose meter kit and supplies KIT Dispense based on patient and insurance preference. Use up to four times daily as directed. 1 each 6    clonazePAM  (KLONOPIN ) 1 MG tablet Take 1 tablet (1 mg total) by mouth in the morning, at noon, and at bedtime. 90 tablet 3   fluorouracil  CALGB 19297 in sodium chloride  0.9 % 150 mL Inject 2,400 mg/m2 into the vein over 48 hr.     gabapentin  (NEURONTIN ) 100 MG capsule TAKE 1 CAPSULE (100 MG TOTAL) BY MOUTH 3 TIMES A DAY 90 capsule 3   glimepiride  (AMARYL ) 1 MG tablet TAKE 1 TABLET BY MOUTH EVERY DAY WITH BREAKFAST 90 tablet 1   Lancets (ONETOUCH DELICA PLUS LANCET33G) MISC Apply topically.     LEUCOVORIN  CALCIUM  IV Inject 400 mg/m2 into the vein every 21 ( twenty-one) days.     lidocaine  (XYLOCAINE ) 2 % solution Use as directed 15 mLs in the mouth or throat every 6 (six) hours as needed for mouth pain. Swish and spit/swallow every six hours as needed for mouth pain 480 mL 3   lidocaine -prilocaine  (EMLA ) cream Apply 1 Application topically as needed. 30 g 0   Misc. Devices MISC Please provide with item number [id number] 2 3/4 [employer] Ostomy bags and item number 11204 Flange 2 3/4 5 Box 12   nystatin (MYCOSTATIN) 100000 UNIT/ML suspension Take by mouth.     ONETOUCH ULTRA test strip Use as instructed 100 each 6   OXALIPLATIN  IV Inject into the vein every 14 (fourteen) days.     oxyCODONE  (OXY IR/ROXICODONE ) 5 MG immediate release tablet Take 5 mg by mouth every 6 (six) hours as needed. for pain     PARoxetine  (PAXIL ) 40 MG tablet Take 1 tablet (40 mg total) by mouth every morning. 90 tablet 3   polyethylene glycol (MIRALAX  / GLYCOLAX ) packet Take 17 g by mouth every other day.     potassium chloride  (KLOR-CON  M10) 10 MEQ tablet Take 2 tablets (20 mEq total) by mouth daily. 180 tablet 3   triamterene -hydrochlorothiazide  (MAXZIDE -25) 37.5-25 MG tablet TAKE 1 TABLET BY MOUTH EVERY DAY 90 tablet 1   No current facility-administered medications for this encounter.    ECOG PERFORMANCE STATUS:  0 - Asymptomatic  REVIEW OF SYSTEMS: Patient has a history of hypertension hypokalemia peripheral neuropathy  anxiety and newly diagnosed adult onset diabetes Patient denies any weight loss, fatigue, weakness, fever, chills or night sweats. Patient denies any loss of vision, blurred vision. Patient denies any ringing  of the ears or hearing loss. No irregular heartbeat. Patient denies heart murmur or history of fainting. Patient denies any chest pain or pain radiating to her upper extremities. Patient denies any shortness of breath, difficulty breathing at night, cough or hemoptysis. Patient denies any swelling in the lower legs. Patient denies any nausea vomiting, vomiting of blood, or coffee ground material in the vomitus. Patient denies any stomach pain. Patient states has had normal bowel movements no significant constipation or diarrhea. Patient denies any dysuria, hematuria or significant nocturia. Patient denies any problems walking, swelling in the joints or loss of balance. Patient denies any skin changes, loss of hair or loss of weight. Patient denies any excessive worrying or anxiety or significant depression. Patient denies any problems with  insomnia. Patient denies excessive thirst, polyuria, polydipsia. Patient denies any swollen glands, patient denies easy bruising or easy bleeding. Patient denies any recent infections, allergies or URI. Patient s visual fields have not changed significantly in recent time.   PHYSICAL EXAM: BP 132/86   Pulse 71   Resp 16   Wt 166 lb (75.3 kg)   BMI 29.41 kg/m  Patient has a working colostomy.  Well-developed well-nourished patient in NAD. HEENT reveals PERLA, EOMI, discs not visualized.  Oral cavity is clear. No oral mucosal lesions are identified. Neck is clear without evidence of cervical or supraclavicular adenopathy. Lungs are clear to A&P. Cardiac examination is essentially unremarkable with regular rate and rhythm without murmur rub or thrill. Abdomen is benign with no organomegaly or masses noted. Motor sensory and DTR levels are equal and symmetric in the  upper and lower extremities. Cranial nerves II through XII are grossly intact. Proprioception is intact. No peripheral adenopathy or edema is identified. No motor or sensory levels are noted. Crude visual fields are within normal range.  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  Risks and benefits of treatment clued extreme low side effect profile possible possible developing a cough possible architectural distortion of her lung all were discussed in detail with the patient.  I have personally 7 ordered CT simulation for next week.  Patient comprehends my recommendations well.  I would like to take this opportunity to thank you for allowing me to participate in the care of your patient.SABRA Marcey Penton, MD

## 2023-12-23 DIAGNOSIS — C187 Malignant neoplasm of sigmoid colon: Secondary | ICD-10-CM | POA: Diagnosis not present

## 2023-12-23 DIAGNOSIS — C7801 Secondary malignant neoplasm of right lung: Secondary | ICD-10-CM | POA: Diagnosis not present

## 2023-12-27 DIAGNOSIS — Z933 Colostomy status: Secondary | ICD-10-CM | POA: Diagnosis not present

## 2023-12-27 DIAGNOSIS — C787 Secondary malignant neoplasm of liver and intrahepatic bile duct: Secondary | ICD-10-CM | POA: Diagnosis not present

## 2023-12-27 DIAGNOSIS — C189 Malignant neoplasm of colon, unspecified: Secondary | ICD-10-CM | POA: Diagnosis not present

## 2023-12-28 ENCOUNTER — Ambulatory Visit
Admission: RE | Admit: 2023-12-28 | Discharge: 2023-12-28 | Disposition: A | Discharge status: Home / Self Care | Source: Ambulatory Visit | Attending: Radiation Oncology | Admitting: Radiation Oncology

## 2023-12-28 ENCOUNTER — Other Ambulatory Visit: Payer: Self-pay

## 2023-12-28 DIAGNOSIS — Z510 Encounter for antineoplastic radiation therapy: Secondary | ICD-10-CM | POA: Diagnosis present

## 2023-12-28 DIAGNOSIS — C7801 Secondary malignant neoplasm of right lung: Secondary | ICD-10-CM | POA: Diagnosis not present

## 2023-12-28 DIAGNOSIS — R918 Other nonspecific abnormal finding of lung field: Secondary | ICD-10-CM | POA: Diagnosis not present

## 2023-12-28 DIAGNOSIS — C787 Secondary malignant neoplasm of liver and intrahepatic bile duct: Secondary | ICD-10-CM | POA: Diagnosis not present

## 2023-12-28 DIAGNOSIS — C187 Malignant neoplasm of sigmoid colon: Secondary | ICD-10-CM | POA: Diagnosis not present

## 2024-01-03 DIAGNOSIS — C20 Malignant neoplasm of rectum: Secondary | ICD-10-CM | POA: Diagnosis not present

## 2024-01-05 ENCOUNTER — Other Ambulatory Visit: Payer: Self-pay

## 2024-01-05 ENCOUNTER — Inpatient Hospital Stay: Attending: Hematology

## 2024-01-05 ENCOUNTER — Encounter: Payer: Self-pay | Admitting: Hematology

## 2024-01-05 ENCOUNTER — Inpatient Hospital Stay

## 2024-01-05 VITALS — BP 141/66 | HR 84 | Temp 97.0°F | Resp 18

## 2024-01-05 DIAGNOSIS — Z923 Personal history of irradiation: Secondary | ICD-10-CM | POA: Diagnosis not present

## 2024-01-05 DIAGNOSIS — R161 Splenomegaly, not elsewhere classified: Secondary | ICD-10-CM | POA: Diagnosis not present

## 2024-01-05 DIAGNOSIS — Z5111 Encounter for antineoplastic chemotherapy: Secondary | ICD-10-CM | POA: Diagnosis not present

## 2024-01-05 DIAGNOSIS — C189 Malignant neoplasm of colon, unspecified: Secondary | ICD-10-CM

## 2024-01-05 DIAGNOSIS — C20 Malignant neoplasm of rectum: Secondary | ICD-10-CM | POA: Diagnosis not present

## 2024-01-05 DIAGNOSIS — C787 Secondary malignant neoplasm of liver and intrahepatic bile duct: Secondary | ICD-10-CM | POA: Diagnosis not present

## 2024-01-05 DIAGNOSIS — Z5112 Encounter for antineoplastic immunotherapy: Secondary | ICD-10-CM | POA: Insufficient documentation

## 2024-01-05 DIAGNOSIS — Z5189 Encounter for other specified aftercare: Secondary | ICD-10-CM | POA: Insufficient documentation

## 2024-01-05 DIAGNOSIS — Z801 Family history of malignant neoplasm of trachea, bronchus and lung: Secondary | ICD-10-CM | POA: Insufficient documentation

## 2024-01-05 DIAGNOSIS — I10 Essential (primary) hypertension: Secondary | ICD-10-CM | POA: Insufficient documentation

## 2024-01-05 DIAGNOSIS — C7800 Secondary malignant neoplasm of unspecified lung: Secondary | ICD-10-CM | POA: Insufficient documentation

## 2024-01-05 DIAGNOSIS — D509 Iron deficiency anemia, unspecified: Secondary | ICD-10-CM | POA: Insufficient documentation

## 2024-01-05 DIAGNOSIS — Z7984 Long term (current) use of oral hypoglycemic drugs: Secondary | ICD-10-CM | POA: Diagnosis not present

## 2024-01-05 DIAGNOSIS — Z809 Family history of malignant neoplasm, unspecified: Secondary | ICD-10-CM | POA: Diagnosis not present

## 2024-01-05 DIAGNOSIS — C187 Malignant neoplasm of sigmoid colon: Secondary | ICD-10-CM | POA: Diagnosis not present

## 2024-01-05 DIAGNOSIS — Z9049 Acquired absence of other specified parts of digestive tract: Secondary | ICD-10-CM | POA: Insufficient documentation

## 2024-01-05 DIAGNOSIS — Z87891 Personal history of nicotine dependence: Secondary | ICD-10-CM | POA: Diagnosis not present

## 2024-01-05 DIAGNOSIS — E1142 Type 2 diabetes mellitus with diabetic polyneuropathy: Secondary | ICD-10-CM | POA: Insufficient documentation

## 2024-01-05 DIAGNOSIS — E876 Hypokalemia: Secondary | ICD-10-CM | POA: Insufficient documentation

## 2024-01-05 LAB — CBC WITH DIFFERENTIAL/PLATELET
Abs Immature Granulocytes: 0.03 K/uL (ref 0.00–0.07)
Basophils Absolute: 0.1 K/uL (ref 0.0–0.1)
Basophils Relative: 1 %
Eosinophils Absolute: 0.1 K/uL (ref 0.0–0.5)
Eosinophils Relative: 2 %
HCT: 37.4 % (ref 36.0–46.0)
Hemoglobin: 12 g/dL (ref 12.0–15.0)
Immature Granulocytes: 1 %
Lymphocytes Relative: 18 %
Lymphs Abs: 1.2 K/uL (ref 0.7–4.0)
MCH: 30.4 pg (ref 26.0–34.0)
MCHC: 32.1 g/dL (ref 30.0–36.0)
MCV: 94.7 fL (ref 80.0–100.0)
Monocytes Absolute: 0.8 K/uL (ref 0.1–1.0)
Monocytes Relative: 13 %
Neutro Abs: 4.4 K/uL (ref 1.7–7.7)
Neutrophils Relative %: 65 %
Platelets: 117 K/uL — ABNORMAL LOW (ref 150–400)
RBC: 3.95 MIL/uL (ref 3.87–5.11)
RDW: 18.9 % — ABNORMAL HIGH (ref 11.5–15.5)
WBC: 6.6 K/uL (ref 4.0–10.5)
nRBC: 0 % (ref 0.0–0.2)

## 2024-01-05 LAB — COMPREHENSIVE METABOLIC PANEL WITH GFR
ALT: 25 U/L (ref 0–44)
AST: 45 U/L — ABNORMAL HIGH (ref 15–41)
Albumin: 3.5 g/dL (ref 3.5–5.0)
Alkaline Phosphatase: 170 U/L — ABNORMAL HIGH (ref 38–126)
Anion gap: 11 (ref 5–15)
BUN: 7 mg/dL — ABNORMAL LOW (ref 8–23)
CO2: 23 mmol/L (ref 22–32)
Calcium: 8.7 mg/dL — ABNORMAL LOW (ref 8.9–10.3)
Chloride: 101 mmol/L (ref 98–111)
Creatinine, Ser: 0.85 mg/dL (ref 0.44–1.00)
GFR, Estimated: >60 mL/min (ref >60)
Glucose, Bld: 188 mg/dL — ABNORMAL HIGH (ref 70–99)
Potassium: 3.2 mmol/L — ABNORMAL LOW (ref 3.5–5.1)
Sodium: 135 mmol/L (ref 135–145)
Total Bilirubin: 1.2 mg/dL (ref 0.0–1.2)
Total Protein: 7.1 g/dL (ref 6.5–8.1)

## 2024-01-05 LAB — URINALYSIS, DIPSTICK ONLY
Bilirubin Urine: NEGATIVE
Glucose, UA: NEGATIVE mg/dL
Hgb urine dipstick: NEGATIVE
Ketones, ur: NEGATIVE mg/dL
Leukocytes,Ua: NEGATIVE
Nitrite: NEGATIVE
Protein, ur: 100 mg/dL — AB
Specific Gravity, Urine: 1.016 (ref 1.005–1.030)
pH: 5 (ref 5.0–8.0)

## 2024-01-05 LAB — MAGNESIUM: Magnesium: 2 mg/dL (ref 1.7–2.4)

## 2024-01-05 MED ORDER — DEXAMETHASONE SODIUM PHOSPHATE 10 MG/ML IJ SOLN
10.0000 mg | Freq: Once | INTRAMUSCULAR | Status: AC
  Administered 2024-01-05: 10 mg via INTRAVENOUS
  Filled 2024-01-05: qty 1

## 2024-01-05 MED ORDER — POTASSIUM CHLORIDE CRYS ER 20 MEQ PO TBCR
40.0000 meq | EXTENDED_RELEASE_TABLET | Freq: Once | ORAL | Status: AC
  Administered 2024-01-05: 40 meq via ORAL
  Filled 2024-01-05: qty 2

## 2024-01-05 MED ORDER — PALONOSETRON HCL INJECTION 0.25 MG/5ML
0.2500 mg | Freq: Once | INTRAVENOUS | Status: AC
  Administered 2024-01-05: 0.25 mg via INTRAVENOUS
  Filled 2024-01-05: qty 5

## 2024-01-05 MED ORDER — SODIUM CHLORIDE 0.9 % IV SOLN
144.0000 mg/m2 | Freq: Once | INTRAVENOUS | Status: AC
  Administered 2024-01-05: 300 mg via INTRAVENOUS
  Filled 2024-01-05: qty 15

## 2024-01-05 MED ORDER — SODIUM CHLORIDE 0.9 % IV SOLN: INTRAVENOUS | Status: DC

## 2024-01-05 MED ORDER — SODIUM CHLORIDE 0.9 % IV SOLN
1920.0000 mg/m2 | INTRAVENOUS | Status: DC
  Administered 2024-01-05: 3500 mg via INTRAVENOUS
  Filled 2024-01-05: qty 70

## 2024-01-05 MED ORDER — ATROPINE SULFATE 1 MG/ML IV SOLN
0.5000 mg | Freq: Once | INTRAVENOUS | Status: AC
  Administered 2024-01-05: 0.5 mg via INTRAVENOUS
  Filled 2024-01-05: qty 1

## 2024-01-05 MED ORDER — SODIUM CHLORIDE 0.9 % IV SOLN
5.0000 mg/kg | Freq: Once | INTRAVENOUS | Status: AC
  Administered 2024-01-05: 400 mg via INTRAVENOUS
  Filled 2024-01-05: qty 16

## 2024-01-05 MED ORDER — FLUOROURACIL CHEMO INJECTION 2.5 GM/50ML
320.0000 mg/m2 | Freq: Once | INTRAVENOUS | Status: AC
  Administered 2024-01-05: 600 mg via INTRAVENOUS
  Filled 2024-01-05: qty 12

## 2024-01-05 MED ORDER — SODIUM CHLORIDE 0.9 % IV SOLN
320.0000 mg/m2 | Freq: Once | INTRAVENOUS | Status: AC
  Administered 2024-01-05: 608 mg via INTRAVENOUS
  Filled 2024-01-05: qty 30.4

## 2024-01-05 MED ORDER — SODIUM CHLORIDE 0.9% FLUSH
10.0000 mL | Freq: Once | INTRAVENOUS | Status: AC
  Administered 2024-01-05: 10 mL via INTRAVENOUS

## 2024-01-05 NOTE — Progress Notes (Signed)
 Patient presents today for chemotherapy infusion.  Patient is in satisfactory condition with no new complaints voiced.  Vital signs are stable.  Labs reviewed.  Urine protein is 100.  MD made aware.  All other labs are within treatment parameters.  Potassium today is 3.2.  We will give Klor Con 40 mEq PO x one dose today per standing orders by Dr. Rogers.  We will proceed with treatment per MD orders.    Patient tolerated treatment well with no complaints voiced.  Home infusion 5FU pump connected.  Patient left ambulatory in stable condition.  Vital signs stable at discharge.  Follow up as scheduled.

## 2024-01-05 NOTE — Patient Instructions (Signed)
 [facility] - A DEPT OF [facility]  Discharge Instructions: Thank you for choosing [facility] to provide your oncology and hematology care.  If you have a lab appointment with the Cancer Center - please note that after April 8th, 2024, all labs will be drawn in the cancer center.  You do not have to check in or register with the main entrance as you have in the past but will complete your check-in in the cancer center.  Wear comfortable clothing and clothing appropriate for easy access to any Portacath or PICC line.   We strive to give you quality time with your provider. You may need to reschedule your appointment if you arrive late (15 or more minutes).  Arriving late affects you and other patients whose appointments are after yours.  Also, if you miss three or more appointments without notifying the office, you may be dismissed from the clinic at the provider's discretion.      For prescription refill requests, have your pharmacy contact our office and allow 72 hours for refills to be completed.    Today you received the following chemotherapy and/or immunotherapy agents MVASI/Irinotecan/Leucovorin/5FU.  Bevacizumab Injection What is this medication? BEVACIZUMAB (be va SIZ yoo mab) treats some types of cancer. It works by blocking a protein that causes cancer cells to grow and multiply. This helps to slow or stop the spread of cancer cells. It is a monoclonal antibody. This medicine may be used for other purposes; ask your health care provider or pharmacist if you have questions. COMMON BRAND NAME(S): Alymsys, Avastin, MVASI, [place] Lao What should I tell my care team before I take this medication? They need to know if you have any of these conditions: Blood clots Coughing up blood Having or recent surgery Heart failure High blood pressure History of a connection between 2 or more body parts that do not usually connect  (fistula) History of a tear in your stomach or intestines Protein in your urine An unusual or allergic reaction to bevacizumab, other medications, foods, dyes, or preservatives Pregnant or trying to get pregnant Breast-feeding How should I use this medication? This medication is injected into a vein. It is given by your care team in a hospital or clinic setting. Talk to your care team the use of this medication in children. Special care may be needed. Overdosage: If you think you have taken too much of this medicine contact a poison control center or emergency room at once. NOTE: This medicine is only for you. Do not share this medicine with others. What if I miss a dose? Keep appointments for follow-up doses. It is important not to miss your dose. Call your care team if you are unable to keep an appointment. What may interact with this medication? Interactions are not expected. This list may not describe all possible interactions. Give your health care provider a list of all the medicines, herbs, non-prescription drugs, or dietary supplements you use. Also tell them if you smoke, drink alcohol, or use illegal drugs. Some items may interact with your medicine. What should I watch for while using this medication? Your condition will be monitored carefully while you are receiving this medication. You may need blood work while taking this medication. This medication may make you feel generally unwell. This is not uncommon as chemotherapy can affect healthy cells as well as cancer cells. Report any side effects. Continue your course of treatment even though you feel  ill unless your care team tells you to stop. This medication may increase your risk to bruise or bleed. Call your care team if you notice any unusual bleeding. Before having surgery, talk to your care team to make sure it is ok. This medication can increase the risk of poor healing of your surgical site or wound. You will need to stop  this medication for 28 days before surgery. After surgery, wait at least 28 days before restarting this medication. Make sure the surgical site or wound is healed enough before restarting this medication. Talk to your care team if questions. Talk to your care team if you may be pregnant. Serious birth defects can occur if you take this medication during pregnancy and for 6 months after the last dose. Contraception is recommended while taking this medication and for 6 months after the last dose. Your care team can help you find the option that works for you. Do not breastfeed while taking this medication and for 6 months after the last dose. This medication can cause infertility. Talk to your care team if you are concerned about your fertility. What side effects may I notice from receiving this medication? Side effects that you should report to your care team as soon as possible: Allergic reactions--skin rash, itching, hives, swelling of the face, lips, tongue, or throat Bleeding--bloody or black, tar-like stools, vomiting blood or Arlone Lenhardt material that looks like coffee grounds, red or dark Serrita Lueth urine, small red or purple spots on skin, unusual bruising or bleeding Blood clot--pain, swelling, or warmth in the leg, shortness of breath, chest pain Heart attack--pain or tightness in the chest, shoulders, arms, or jaw, nausea, shortness of breath, cold or clammy skin, feeling faint or lightheaded Heart failure--shortness of breath, swelling of the ankles, feet, or hands, sudden weight gain, unusual weakness or fatigue Increase in blood pressure Infection--fever, chills, cough, sore throat, wounds that don't heal, pain or trouble when passing urine, general feeling of discomfort or being unwell Infusion reactions--chest pain, shortness of breath or trouble breathing, feeling faint or lightheaded Kidney injury--decrease in the amount of urine, swelling of the ankles, hands, or feet Stomach pain that is  severe, does not go away, or gets worse Stroke--sudden numbness or weakness of the face, arm, or leg, trouble speaking, confusion, trouble walking, loss of balance or coordination, dizziness, severe headache, change in vision Sudden and severe headache, confusion, change in vision, seizures, which may be signs of posterior reversible encephalopathy syndrome (PRES) Side effects that usually do not require medical attention (report to your care team if they continue or are bothersome): Back pain Change in taste Diarrhea Dry skin Increased tears Nosebleed This list may not describe all possible side effects. Call your doctor for medical advice about side effects. You may report side effects to FDA at 1-800-FDA-1088. Where should I keep my medication? This medication is given in a hospital or clinic. It will not be stored at home. NOTE: This sheet is a summary. It may not cover all possible information. If you have questions about this medicine, talk to your doctor, pharmacist, or health care provider.  2024 Elsevier/Gold Standard (2021-10-31 00:00:00)   Irinotecan Injection What is this medication? IRINOTECAN (ir in oh TEE kan) treats some types of cancer. It works by slowing down the growth of cancer cells. This medicine may be used for other purposes; ask your health care provider or pharmacist if you have questions. COMMON BRAND NAME(S): Camptosar What should I tell my care team  before I take this medication? They need to know if you have any of these conditions: Dehydration Diarrhea Infection, especially a viral infection, such as chickenpox, cold sores, herpes Liver disease Low blood cell levels (white cells, red cells, and platelets) Low levels of electrolytes, such as calcium, magnesium, or potassium in your blood Recent or ongoing radiation An unusual or allergic reaction to irinotecan, other medications, foods, dyes, or preservatives If you or your partner are pregnant or  trying to get pregnant Breast-feeding How should I use this medication? This medication is injected into a vein. It is given by your care team in a hospital or clinic setting. Talk to your care team about the use of this medication in children. Special care may be needed. Overdosage: If you think you have taken too much of this medicine contact a poison control center or emergency room at once. NOTE: This medicine is only for you. Do not share this medicine with others. What if I miss a dose? Keep appointments for follow-up doses. It is important not to miss your dose. Call your care team if you are unable to keep an appointment. What may interact with this medication? Do not take this medication with any of the following: Cobicistat Itraconazole This medication may also interact with the following: Certain antibiotics, such as clarithromycin, rifampin, rifabutin Certain antivirals for HIV or AIDS Certain medications for fungal infections, such as ketoconazole, posaconazole, voriconazole Certain medications for seizures, such as carbamazepine, phenobarbital, phenytoin Gemfibrozil Nefazodone St. John's wort This list may not describe all possible interactions. Give your health care provider a list of all the medicines, herbs, non-prescription drugs, or dietary supplements you use. Also tell them if you smoke, drink alcohol, or use illegal drugs. Some items may interact with your medicine. What should I watch for while using this medication? Your condition will be monitored carefully while you are receiving this medication. You may need blood work while taking this medication. This medication may make you feel generally unwell. This is not uncommon as chemotherapy can affect healthy cells as well as cancer cells. Report any side effects. Continue your course of treatment even though you feel ill unless your care team tells you to stop. This medication can cause serious side effects. To reduce  the risk, your care team may give you other medications to take before receiving this one. Be sure to follow the directions from your care team. This medication may affect your coordination, reaction time, or judgement. Do not drive or operate machinery until you know how this medication affects you. Sit up or stand slowly to reduce the risk of dizzy or fainting spells. Drinking alcohol with this medication can increase the risk of these side effects. This medication may increase your risk of getting an infection. Call your care team for advice if you get a fever, chills, sore throat, or other symptoms of a cold or flu. Do not treat yourself. Try to avoid being around people who are sick. Avoid taking medications that contain aspirin, acetaminophen, ibuprofen, naproxen, or ketoprofen unless instructed by your care team. These medications may hide a fever. This medication may increase your risk to bruise or bleed. Call your care team if you notice any unusual bleeding. Be careful brushing or flossing your teeth or using a toothpick because you may get an infection or bleed more easily. If you have any dental work done, tell your dentist you are receiving this medication. Talk to your care team if you or your  partner are pregnant or think either of you might be pregnant. This medication can cause serious birth defects if taken during pregnancy and for 6 months after the last dose. You will need a negative pregnancy test before starting this medication. Contraception is recommended while taking this medication and for 6 months after the last dose. Your care team can help you find the option that works for you. Do not father a child while taking this medication and for 3 months after the last dose. Use a condom for contraception during this time period. Do not breastfeed while taking this medication and for 7 days after the last dose. This medication may cause infertility. Talk to your care team if you are  concerned about your fertility. What side effects may I notice from receiving this medication? Side effects that you should report to your care team as soon as possible: Allergic reactions--skin rash, itching, hives, swelling of the face, lips, tongue, or throat Dry cough, shortness of breath or trouble breathing Increased saliva or tears, increased sweating, stomach cramping, diarrhea, small pupils, unusual weakness or fatigue, slow heartbeat Infection--fever, chills, cough, sore throat, wounds that don't heal, pain or trouble when passing urine, general feeling of discomfort or being unwell Kidney injury--decrease in the amount of urine, swelling of the ankles, hands, or feet Low red blood cell level--unusual weakness or fatigue, dizziness, headache, trouble breathing Severe or prolonged diarrhea Unusual bruising or bleeding Side effects that usually do not require medical attention (report to your care team if they continue or are bothersome): Constipation Diarrhea Hair loss Loss of appetite Nausea Stomach pain This list may not describe all possible side effects. Call your doctor for medical advice about side effects. You may report side effects to FDA at 1-800-FDA-1088. Where should I keep my medication? This medication is given in a hospital or clinic. It will not be stored at home. NOTE: This sheet is a summary. It may not cover all possible information. If you have questions about this medicine, talk to your doctor, pharmacist, or health care provider.  2024 Elsevier/Gold Standard (2021-10-27 00:00:00)   Leucovorin Injection What is this medication? LEUCOVORIN (loo koe VOR in) prevents side effects from certain medications, such as methotrexate. It works by increasing folate levels. This helps protect healthy cells in your body. It may also be used to treat anemia caused by low levels of folate. It can also be used with fluorouracil, a type of chemotherapy, to treat colorectal  cancer. It works by increasing the effects of fluorouracil in the body. This medicine may be used for other purposes; ask your health care provider or pharmacist if you have questions. What should I tell my care team before I take this medication? They need to know if you have any of these conditions: Anemia from low levels of vitamin B12 in the blood An unusual or allergic reaction to leucovorin, folic acid, other medications, foods, dyes, or preservatives Pregnant or trying to get pregnant Breastfeeding How should I use this medication? This medication is injected into a vein or a muscle. It is given by your care team in a hospital or clinic setting. Talk to your care team about the use of this medication in children. Special care may be needed. Overdosage: If you think you have taken too much of this medicine contact a poison control center or emergency room at once. NOTE: This medicine is only for you. Do not share this medicine with others. What if I miss a dose?  Keep appointments for follow-up doses. It is important not to miss your dose. Call your care team if you are unable to keep an appointment. What may interact with this medication? Capecitabine Fluorouracil Phenobarbital Phenytoin Primidone Trimethoprim;sulfamethoxazole This list may not describe all possible interactions. Give your health care provider a list of all the medicines, herbs, non-prescription drugs, or dietary supplements you use. Also tell them if you smoke, drink alcohol, or use illegal drugs. Some items may interact with your medicine. What should I watch for while using this medication? Your condition will be monitored carefully while you are receiving this medication. This medication may increase the side effects of 5-fluorouracil. Tell your care team if you have diarrhea or mouth sores that do not get better or that get worse. What side effects may I notice from receiving this medication? Side effects that  you should report to your care team as soon as possible: Allergic reactions--skin rash, itching, hives, swelling of the face, lips, tongue, or throat This list may not describe all possible side effects. Call your doctor for medical advice about side effects. You may report side effects to FDA at 1-800-FDA-1088. Where should I keep my medication? This medication is given in a hospital or clinic. It will not be stored at home. NOTE: This sheet is a summary. It may not cover all possible information. If you have questions about this medicine, talk to your doctor, pharmacist, or health care provider.  2024 Elsevier/Gold Standard (2021-11-18 00:00:00)   Fluorouracil Injection What is this medication? FLUOROURACIL (flure oh YOOR a sil) treats some types of cancer. It works by slowing down the growth of cancer cells. This medicine may be used for other purposes; ask your health care provider or pharmacist if you have questions. COMMON BRAND NAME(S): Adrucil What should I tell my care team before I take this medication? They need to know if you have any of these conditions: Blood disorders Dihydropyrimidine dehydrogenase (DPD) deficiency Infection, such as chickenpox, cold sores, herpes Kidney disease Liver disease Poor nutrition Recent or ongoing radiation therapy An unusual or allergic reaction to fluorouracil, other medications, foods, dyes, or preservatives If you or your partner are pregnant or trying to get pregnant Breast-feeding How should I use this medication? This medication is injected into a vein. It is administered by your care team in a hospital or clinic setting. Talk to your care team about the use of this medication in children. Special care may be needed. Overdosage: If you think you have taken too much of this medicine contact a poison control center or emergency room at once. NOTE: This medicine is only for you. Do not share this medicine with others. What if I miss a  dose? Keep appointments for follow-up doses. It is important not to miss your dose. Call your care team if you are unable to keep an appointment. What may interact with this medication? Do not take this medication with any of the following: Live virus vaccines This medication may also interact with the following: Medications that treat or prevent blood clots, such as warfarin, enoxaparin, dalteparin This list may not describe all possible interactions. Give your health care provider a list of all the medicines, herbs, non-prescription drugs, or dietary supplements you use. Also tell them if you smoke, drink alcohol, or use illegal drugs. Some items may interact with your medicine. What should I watch for while using this medication? Your condition will be monitored carefully while you are receiving this medication. This medication  may make you feel generally unwell. This is not uncommon as chemotherapy can affect healthy cells as well as cancer cells. Report any side effects. Continue your course of treatment even though you feel ill unless your care team tells you to stop. In some cases, you may be given additional medications to help with side effects. Follow all directions for their use. This medication may increase your risk of getting an infection. Call your care team for advice if you get a fever, chills, sore throat, or other symptoms of a cold or flu. Do not treat yourself. Try to avoid being around people who are sick. This medication may increase your risk to bruise or bleed. Call your care team if you notice any unusual bleeding. Be careful brushing or flossing your teeth or using a toothpick because you may get an infection or bleed more easily. If you have any dental work done, tell your dentist you are receiving this medication. Avoid taking medications that contain aspirin, acetaminophen, ibuprofen, naproxen, or ketoprofen unless instructed by your care team. These medications may hide  a fever. Do not treat diarrhea with over the counter products. Contact your care team if you have diarrhea that lasts more than 2 days or if it is severe and watery. This medication can make you more sensitive to the sun. Keep out of the sun. If you cannot avoid being in the sun, wear protective clothing and sunscreen. Do not use sun lamps, tanning beds, or tanning booths. Talk to your care team if you or your partner wish to become pregnant or think you might be pregnant. This medication can cause serious birth defects if taken during pregnancy and for 3 months after the last dose. A reliable form of contraception is recommended while taking this medication and for 3 months after the last dose. Talk to your care team about effective forms of contraception. Do not father a child while taking this medication and for 3 months after the last dose. Use a condom while having sex during this time period. Do not breastfeed while taking this medication. This medication may cause infertility. Talk to your care team if you are concerned about your fertility. What side effects may I notice from receiving this medication? Side effects that you should report to your care team as soon as possible: Allergic reactions--skin rash, itching, hives, swelling of the face, lips, tongue, or throat Heart attack--pain or tightness in the chest, shoulders, arms, or jaw, nausea, shortness of breath, cold or clammy skin, feeling faint or lightheaded Heart failure--shortness of breath, swelling of the ankles, feet, or hands, sudden weight gain, unusual weakness or fatigue Heart rhythm changes--fast or irregular heartbeat, dizziness, feeling faint or lightheaded, chest pain, trouble breathing High ammonia level--unusual weakness or fatigue, confusion, loss of appetite, nausea, vomiting, seizures Infection--fever, chills, cough, sore throat, wounds that don't heal, pain or trouble when passing urine, general feeling of discomfort or  being unwell Low red blood cell level--unusual weakness or fatigue, dizziness, headache, trouble breathing Pain, tingling, or numbness in the hands or feet, muscle weakness, change in vision, confusion or trouble speaking, loss of balance or coordination, trouble walking, seizures Redness, swelling, and blistering of the skin over hands and feet Severe or prolonged diarrhea Unusual bruising or bleeding Side effects that usually do not require medical attention (report to your care team if they continue or are bothersome): Dry skin Headache Increased tears Nausea Pain, redness, or swelling with sores inside the mouth or throat Sensitivity to  light Vomiting This list may not describe all possible side effects. Call your doctor for medical advice about side effects. You may report side effects to FDA at 1-800-FDA-1088. Where should I keep my medication? This medication is given in a hospital or clinic. It will not be stored at home. NOTE: This sheet is a summary. It may not cover all possible information. If you have questions about this medicine, talk to your doctor, pharmacist, or health care provider.  2024 Elsevier/Gold Standard (2021-10-21 00:00:00)       To help prevent nausea and vomiting after your treatment, we encourage you to take your nausea medication as directed.  BELOW ARE SYMPTOMS THAT SHOULD BE REPORTED IMMEDIATELY: *FEVER GREATER THAN 100.4 F (38 C) OR HIGHER *CHILLS OR SWEATING *NAUSEA AND VOMITING THAT IS NOT CONTROLLED WITH YOUR NAUSEA MEDICATION *UNUSUAL SHORTNESS OF BREATH *UNUSUAL BRUISING OR BLEEDING *URINARY PROBLEMS (pain or burning when urinating, or frequent urination) *BOWEL PROBLEMS (unusual diarrhea, constipation, pain near the anus) TENDERNESS IN MOUTH AND THROAT WITH OR WITHOUT PRESENCE OF ULCERS (sore throat, sores in mouth, or a toothache) UNUSUAL RASH, SWELLING OR PAIN  UNUSUAL VAGINAL DISCHARGE OR ITCHING   Items with * indicate a potential  emergency and should be followed up as soon as possible or go to the Emergency Department if any problems should occur.  Please show the CHEMOTHERAPY ALERT CARD or IMMUNOTHERAPY ALERT CARD at check-in to the Emergency Department and triage nurse.  Should you have questions after your visit or need to cancel or reschedule your appointment, please contact [facility] [place] - A DEPT OF [facility] [phone]  and follow the prompts.  Office hours are 8:00 a.m. to 4:30 p.m. Monday - Friday. Please note that voicemails left after 4:00 p.m. may not be returned until the following business day.  We are closed weekends and major holidays. You have access to a nurse at all times for urgent questions. Please call the main number to the clinic [phone] and follow the prompts.  For any non-urgent questions, you may also contact your provider using MyChart. We now offer e-Visits for anyone 73 and older to request care online for non-urgent symptoms. For details visit [email or web address].   Also download the MyChart app! Go to the app store, search "MyChart", open the app, select [place], and log in with your MyChart username and password.

## 2024-01-06 DIAGNOSIS — C7801 Secondary malignant neoplasm of right lung: Secondary | ICD-10-CM | POA: Diagnosis not present

## 2024-01-06 DIAGNOSIS — Z510 Encounter for antineoplastic radiation therapy: Secondary | ICD-10-CM | POA: Diagnosis not present

## 2024-01-06 DIAGNOSIS — C187 Malignant neoplasm of sigmoid colon: Secondary | ICD-10-CM | POA: Diagnosis not present

## 2024-01-06 LAB — CEA: CEA: 8 ng/mL — ABNORMAL HIGH (ref 0.0–4.7)

## 2024-01-07 ENCOUNTER — Inpatient Hospital Stay

## 2024-01-07 VITALS — BP 142/79 | HR 70 | Temp 98.0°F | Resp 18

## 2024-01-07 DIAGNOSIS — C189 Malignant neoplasm of colon, unspecified: Secondary | ICD-10-CM

## 2024-01-07 DIAGNOSIS — Z5111 Encounter for antineoplastic chemotherapy: Secondary | ICD-10-CM | POA: Diagnosis not present

## 2024-01-07 MED ORDER — SODIUM CHLORIDE 0.9% FLUSH
10.0000 mL | INTRAVENOUS | Status: DC | PRN
  Administered 2024-01-07: 10 mL

## 2024-01-07 MED ORDER — HEPARIN SOD (PORK) LOCK FLUSH 100 UNIT/ML IV SOLN
500.0000 [IU] | Freq: Once | INTRAVENOUS | Status: AC | PRN
  Administered 2024-01-07: 500 [IU]

## 2024-01-07 MED ORDER — PEGFILGRASTIM-JMDB 6 MG/0.6ML ~~LOC~~ SOSY
6.0000 mg | PREFILLED_SYRINGE | Freq: Once | SUBCUTANEOUS | Status: AC
Start: 2024-01-07 — End: 2024-01-07
  Administered 2024-01-07: 6 mg via SUBCUTANEOUS
  Filled 2024-01-07: qty 0.6

## 2024-01-07 NOTE — Patient Instructions (Signed)
 [facility] [place] - A DEPT OF [facility]  Discharge Instructions: Thank you for choosing [facility] to provide your oncology and hematology care.  If you have a lab appointment with the Cancer Center - please note that after April 8th, 2024, all labs will be drawn in the cancer center.  You do not have to check in or register with the main entrance as you have in the past but will complete your check-in in the cancer center.  Wear comfortable clothing and clothing appropriate for easy access to any Portacath or PICC line.   We strive to give you quality time with your provider. You may need to reschedule your appointment if you arrive late (15 or more minutes).  Arriving late affects you and other patients whose appointments are after yours.  Also, if you miss three or more appointments without notifying the office, you may be dismissed from the clinic at the provider's discretion.      For prescription refill requests, have your pharmacy contact our office and allow 72 hours for refills to be completed.    Today you received the following chemotherapy and/or immunotherapy agents chemotherapy pump disconnection     To help prevent nausea and vomiting after your treatment, we encourage you to take your nausea medication as directed.  BELOW ARE SYMPTOMS THAT SHOULD BE REPORTED IMMEDIATELY: *FEVER GREATER THAN 100.4 F (38 C) OR HIGHER *CHILLS OR SWEATING *NAUSEA AND VOMITING THAT IS NOT CONTROLLED WITH YOUR NAUSEA MEDICATION *UNUSUAL SHORTNESS OF BREATH *UNUSUAL BRUISING OR BLEEDING *URINARY PROBLEMS (pain or burning when urinating, or frequent urination) *BOWEL PROBLEMS (unusual diarrhea, constipation, pain near the anus) TENDERNESS IN MOUTH AND THROAT WITH OR WITHOUT PRESENCE OF ULCERS (sore throat, sores in mouth, or a toothache) UNUSUAL RASH, SWELLING OR PAIN  UNUSUAL VAGINAL DISCHARGE OR ITCHING   Items with * indicate a potential emergency and  should be followed up as soon as possible or go to the Emergency Department if any problems should occur.  Please show the CHEMOTHERAPY ALERT CARD or IMMUNOTHERAPY ALERT CARD at check-in to the Emergency Department and triage nurse.  Should you have questions after your visit or need to cancel or reschedule your appointment, please contact [facility] [place] - A DEPT OF [facility] [phone]  and follow the prompts.  Office hours are 8:00 a.m. to 4:30 p.m. Monday - Friday. Please note that voicemails left after 4:00 p.m. may not be returned until the following business day.  We are closed weekends and major holidays. You have access to a nurse at all times for urgent questions. Please call the main number to the clinic [phone] and follow the prompts.  For any non-urgent questions, you may also contact your provider using MyChart. We now offer e-Visits for anyone 27 and older to request care online for non-urgent symptoms. For details visit [email or web address].   Also download the MyChart app! Go to the app store, search MyChart, open the app, select [facility], and log in with your MyChart username and password.

## 2024-01-07 NOTE — Progress Notes (Signed)
 Patient presents today for 5FU chemotherapy pump disconnection and Fulphila 6mg  injection per provider's order. Vital signs stable and patient voiced no new complaints at this time.   Port flushed easily with 10 mL of normal saline and 5 mL of heparin. Good blood return noted and needle removed intact. No bruising or swelling noted at the site.  Discharged from clinic ambulatory in stable condition. Alert and oriented x 3. F/U with [facility] as scheduled.

## 2024-01-10 ENCOUNTER — Other Ambulatory Visit: Payer: Self-pay

## 2024-01-10 ENCOUNTER — Ambulatory Visit
Admission: RE | Admit: 2024-01-10 | Discharge: 2024-01-10 | Disposition: A | Discharge status: Home / Self Care | Source: Ambulatory Visit | Attending: Radiation Oncology | Admitting: Radiation Oncology

## 2024-01-10 DIAGNOSIS — C7801 Secondary malignant neoplasm of right lung: Secondary | ICD-10-CM | POA: Diagnosis not present

## 2024-01-10 DIAGNOSIS — C187 Malignant neoplasm of sigmoid colon: Secondary | ICD-10-CM | POA: Diagnosis not present

## 2024-01-10 DIAGNOSIS — Z510 Encounter for antineoplastic radiation therapy: Secondary | ICD-10-CM | POA: Diagnosis not present

## 2024-01-10 LAB — RAD ONC ARIA SESSION SUMMARY
Course Elapsed Days: 0
Plan Fractions Treated to Date: 1
Plan Prescribed Dose Per Fraction: 6 Gy
Plan Total Fractions Prescribed: 10
Plan Total Prescribed Dose: 60 Gy
Reference Point Dosage Given to Date: 6 Gy
Reference Point Session Dosage Given: 6 Gy
Session Number: 1

## 2024-01-11 ENCOUNTER — Other Ambulatory Visit: Payer: Self-pay

## 2024-01-11 ENCOUNTER — Ambulatory Visit
Admission: RE | Admit: 2024-01-11 | Discharge: 2024-01-11 | Disposition: A | Discharge status: Home / Self Care | Source: Ambulatory Visit | Attending: Radiation Oncology | Admitting: Radiation Oncology

## 2024-01-11 DIAGNOSIS — Z510 Encounter for antineoplastic radiation therapy: Secondary | ICD-10-CM | POA: Diagnosis not present

## 2024-01-11 LAB — RAD ONC ARIA SESSION SUMMARY
Course Elapsed Days: 1
Plan Fractions Treated to Date: 2
Plan Prescribed Dose Per Fraction: 6 Gy
Plan Total Fractions Prescribed: 10
Plan Total Prescribed Dose: 60 Gy
Reference Point Dosage Given to Date: 12 Gy
Reference Point Session Dosage Given: 6 Gy
Session Number: 2

## 2024-01-12 ENCOUNTER — Other Ambulatory Visit: Payer: Self-pay

## 2024-01-12 ENCOUNTER — Ambulatory Visit
Admission: RE | Admit: 2024-01-12 | Discharge: 2024-01-12 | Disposition: A | Discharge status: Home / Self Care | Source: Ambulatory Visit | Attending: Radiation Oncology | Admitting: Radiation Oncology

## 2024-01-12 ENCOUNTER — Ambulatory Visit

## 2024-01-12 ENCOUNTER — Encounter: Payer: Self-pay | Admitting: *Deleted

## 2024-01-12 DIAGNOSIS — Z510 Encounter for antineoplastic radiation therapy: Secondary | ICD-10-CM | POA: Diagnosis not present

## 2024-01-12 LAB — RAD ONC ARIA SESSION SUMMARY
Course Elapsed Days: 2
Plan Fractions Treated to Date: 3
Plan Prescribed Dose Per Fraction: 6 Gy
Plan Total Fractions Prescribed: 10
Plan Total Prescribed Dose: 60 Gy
Reference Point Dosage Given to Date: 18 Gy
Reference Point Session Dosage Given: 6 Gy
Session Number: 3

## 2024-01-13 ENCOUNTER — Ambulatory Visit
Admission: RE | Admit: 2024-01-13 | Discharge: 2024-01-13 | Disposition: A | Discharge status: Home / Self Care | Source: Ambulatory Visit | Attending: Radiation Oncology | Admitting: Radiation Oncology

## 2024-01-13 ENCOUNTER — Other Ambulatory Visit: Payer: Self-pay

## 2024-01-13 ENCOUNTER — Other Ambulatory Visit: Payer: Self-pay | Admitting: Hematology

## 2024-01-13 DIAGNOSIS — Z510 Encounter for antineoplastic radiation therapy: Secondary | ICD-10-CM | POA: Diagnosis not present

## 2024-01-13 DIAGNOSIS — F419 Anxiety disorder, unspecified: Secondary | ICD-10-CM

## 2024-01-13 LAB — RAD ONC ARIA SESSION SUMMARY
Course Elapsed Days: 3
Plan Fractions Treated to Date: 4
Plan Prescribed Dose Per Fraction: 6 Gy
Plan Total Fractions Prescribed: 10
Plan Total Prescribed Dose: 60 Gy
Reference Point Dosage Given to Date: 24 Gy
Reference Point Session Dosage Given: 6 Gy
Session Number: 4

## 2024-01-14 ENCOUNTER — Ambulatory Visit
Admission: RE | Admit: 2024-01-14 | Discharge: 2024-01-14 | Disposition: A | Discharge status: Home / Self Care | Source: Ambulatory Visit | Attending: Radiation Oncology | Admitting: Radiation Oncology

## 2024-01-14 ENCOUNTER — Other Ambulatory Visit: Payer: Self-pay

## 2024-01-14 DIAGNOSIS — C187 Malignant neoplasm of sigmoid colon: Secondary | ICD-10-CM | POA: Diagnosis not present

## 2024-01-14 DIAGNOSIS — Z510 Encounter for antineoplastic radiation therapy: Secondary | ICD-10-CM | POA: Diagnosis not present

## 2024-01-14 DIAGNOSIS — C7801 Secondary malignant neoplasm of right lung: Secondary | ICD-10-CM | POA: Diagnosis not present

## 2024-01-14 LAB — RAD ONC ARIA SESSION SUMMARY
Course Elapsed Days: 4
Plan Fractions Treated to Date: 5
Plan Prescribed Dose Per Fraction: 6 Gy
Plan Total Fractions Prescribed: 10
Plan Total Prescribed Dose: 60 Gy
Reference Point Dosage Given to Date: 30 Gy
Reference Point Session Dosage Given: 6 Gy
Session Number: 5

## 2024-01-17 ENCOUNTER — Ambulatory Visit

## 2024-01-17 ENCOUNTER — Other Ambulatory Visit: Payer: Self-pay

## 2024-01-17 ENCOUNTER — Ambulatory Visit
Admission: RE | Admit: 2024-01-17 | Discharge: 2024-01-17 | Disposition: A | Discharge status: Home / Self Care | Source: Ambulatory Visit | Attending: Radiation Oncology | Admitting: Radiation Oncology

## 2024-01-17 DIAGNOSIS — Z510 Encounter for antineoplastic radiation therapy: Secondary | ICD-10-CM | POA: Diagnosis not present

## 2024-01-17 LAB — RAD ONC ARIA SESSION SUMMARY
Course Elapsed Days: 7
Plan Fractions Treated to Date: 6
Plan Prescribed Dose Per Fraction: 6 Gy
Plan Total Fractions Prescribed: 10
Plan Total Prescribed Dose: 60 Gy
Reference Point Dosage Given to Date: 36 Gy
Reference Point Session Dosage Given: 6 Gy
Session Number: 6

## 2024-01-18 ENCOUNTER — Ambulatory Visit
Admission: RE | Admit: 2024-01-18 | Discharge: 2024-01-18 | Disposition: A | Discharge status: Home / Self Care | Source: Ambulatory Visit | Attending: Radiation Oncology | Admitting: Radiation Oncology

## 2024-01-18 ENCOUNTER — Encounter: Payer: Self-pay | Admitting: *Deleted

## 2024-01-18 ENCOUNTER — Other Ambulatory Visit: Payer: Self-pay

## 2024-01-18 DIAGNOSIS — C189 Malignant neoplasm of colon, unspecified: Secondary | ICD-10-CM | POA: Diagnosis not present

## 2024-01-18 DIAGNOSIS — Z933 Colostomy status: Secondary | ICD-10-CM | POA: Diagnosis not present

## 2024-01-18 DIAGNOSIS — Z510 Encounter for antineoplastic radiation therapy: Secondary | ICD-10-CM | POA: Diagnosis not present

## 2024-01-18 DIAGNOSIS — C787 Secondary malignant neoplasm of liver and intrahepatic bile duct: Secondary | ICD-10-CM | POA: Diagnosis not present

## 2024-01-18 LAB — RAD ONC ARIA SESSION SUMMARY
Course Elapsed Days: 8
Plan Fractions Treated to Date: 7
Plan Prescribed Dose Per Fraction: 6 Gy
Plan Total Fractions Prescribed: 10
Plan Total Prescribed Dose: 60 Gy
Reference Point Dosage Given to Date: 42 Gy
Reference Point Session Dosage Given: 6 Gy
Session Number: 7

## 2024-01-18 NOTE — Progress Notes (Signed)
 [facility] [address] [place] [address]    Clinic Day:  01/19/2024  Referring physician: Rogers Hai, MD  Patient Care Team: Rogers Hai, MD as PCP - General (Hematology) Celestia Joesph SQUIBB, RN as Oncology Nurse Navigator (Oncology) Lenn Aran, MD as Consulting Physician (Radiation Oncology)   ASSESSMENT & PLAN:   Assessment: 1.  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 -CT chest on 05/02/2020 with no evidence of pulmonary metastatic disease. -CTAP on 05/07/2020 with multiple lesions throughout the liver, largest in the right lobe measuring 3.9 cm, lateral segment left lobe measuring 2.6 cm and inferior right lobe confluent lesion 4.2 x 3.7 cm.  No lymphadenopathy. -CEA on 05/02/2020-60.2. -FOLFOX and bevacizumab  started on 06/04/2020. 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 - FOLFOX with bevacizumab  from 03/10/2023 through 05/24/2023 with progression. - FOLFIRI and bevacizumab  started on 06/09/2023 - SBRT to the lung lesions started on 01/10/2024   2.  Iron deficiency anemia: -Feraheme  on 04/30/2020 and 05/06/2020.   3.  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 -Smoked for 3 to 4 years and quit. -Mother had cancer, type unknown.  Maternal uncle had lung cancer.  Maternal aunt had lung cancer.    Plan: 1.  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  Also spiculated nodule of the superior segment of the right lower lobe measures 1.2 x 0.8 cm, previously 8-9 mm.  Rest of the lung nodules have been  stable.  Multiple liver lesions have been stable.  Unchanged minimal peritoneal thickening/stranding.  Coarse contour of the liver, may reflect cirrhosis/pseudocirrhosis and splenomegaly 15.9 cm. - She is tolerating FOLFIRI and bevacizumab  very well. - He was evaluated by Dr. Lenn and was started on SBRT to the lung lesions on 01/10/2024.  She is tolerating it well.  She will complete it around 02/03/2024. - Labs today: AST 42 and rest of LFTs are normal.  CBC grossly normal with platelet count 84,000.  Last CEA has improved to 8.0.  UA today is negative for protein. - She may continue with FOLFIRI and bevacizumab  every 2 weeks.  RTC 8 weeks for follow-up with repeat CT CAP with contrast and tumor marker.    2.  Hypertension: - Continue Norvasc  10 mg daily and triamterene /HCTZ daily.  Blood pressure is well-controlled at 139/68.   3.  Severe hypokalemia: - She will continue potassium and 40 mill equivalents daily.  Potassium is 3.2 today.   4.  Peripheral neuropathy: - Numbness is stable in the fingertips.  Continue gabapentin  100 mg 3 times daily.   5.  Anxiety: - Continue Paxil  40 mg daily and Klonopin  1 mg 3 times daily.  Symptoms well-controlled.  6.  Newly diagnosed diabetes: - Continue glimepiride  1 mg tablet with breakfast daily.  Hemoglobin A1c improved to 5.2 on 12/15/2023.    Orders Placed This Encounter  Procedures   CT CHEST ABDOMEN PELVIS W CONTRAST    Standing Status:   Future    Expected Date:   04/20/2024    Expiration Date:   01/18/2025    If indicated for the ordered procedure, I authorize the administration of  contrast media per Radiology protocol:   Yes    Does the patient have a contrast media/X-ray dye allergy?:   No    Preferred imaging location?:   [facility]    If indicated for the ordered procedure, I authorize the administration of oral contrast media per Radiology protocol:   Yes   CT CHEST ABDOMEN PELVIS W CONTRAST    Standing Status:   Future     Expected Date:   03/14/2024    Expiration Date:   01/18/2025    If indicated for the ordered procedure, I authorize the administration of contrast media per Radiology protocol:   Yes    Does the patient have a contrast media/X-ray dye allergy?:   No    Preferred imaging location?:   [facility]    If indicated for the ordered procedure, I authorize the administration of oral contrast media per Radiology protocol:   Yes   CEA    Standing Status:   Future    Expected Date:   02/01/2024    Expiration Date:   01/31/2025   Magnesium    Standing Status:   Future    Expected Date:   02/01/2024    Expiration Date:   01/31/2025   CBC with Differential    Standing Status:   Future    Expected Date:   02/01/2024    Expiration Date:   01/31/2025   Comprehensive metabolic panel    Standing Status:   Future    Expected Date:   02/01/2024    Expiration Date:   01/31/2025   Urinalysis, dipstick only    Standing Status:   Future    Expected Date:   02/01/2024    Expiration Date:   01/31/2025      LILLETTE Hummingbird R Martin,acting as a scribe for Susan Stands, MD.,have documented all relevant documentation on the behalf of Susan Stands, MD,as directed by  Susan Stands, MD while in the presence of Susan Stands, MD.  I, Susan Stands MD, have reviewed the above documentation for accuracy and completeness, and I agree with the above.      Susan Stands, MD   7/23/202511:06 AM  CHIEF COMPLAINT:   Diagnosis: metastatic colon cancer to liver    Cancer Staging  No matching staging information was found for the patient.    Prior Therapy: 1. Sigmoid colectomy and end colostomy on 04/29/2020  2. FOLFOX with bevacizumab , 06/04/20 - 11/06/20 3. Maintenance 5-FU and bevacizumab , 11/20/20 - 02/17/23 4. FOLFOX with bevacizumab , 03/10/23 - 05/24/23  Current Therapy:  FOLFIRI with bevacizumab     HISTORY OF PRESENT ILLNESS:   Oncology History  Metastatic colon cancer to liver  (HCC)  05/16/2020 Initial Diagnosis   Metastatic colon cancer to liver (HCC)   06/03/2020 Genetic Testing   Foundation One:     06/04/2020 - 02/25/2022 Chemotherapy   Patient is on Treatment Plan : COLORECTAL FOLFOX + Bevacizumab  q14d     06/04/2020 - 05/26/2023 Chemotherapy   Patient is on Treatment Plan : COLORECTAL FOLFOX + Bevacizumab  q14d     06/09/2023 -  Chemotherapy   Patient is on Treatment Plan : COLORECTAL FOLFIRI + Bevacizumab  q14d        INTERVAL HISTORY:   Susan Martin is a 63 y.o. female presenting to clinic today for follow up of metastatic colon cancer to liver. She was last seen by me on 12/15/2023.  Today, she states that she is doing well overall. Her appetite level is at 80%.  Her energy level is at 80%.  PAST MEDICAL HISTORY:   Past Medical History: Past Medical History:  Diagnosis Date   Adenocarcinoma of colon metastatic to liver [facility]) onocology--- dr s. rogers   04-29-2020 [facility] surgery for perforated colon s/p sigmoid colectomy w/ colostomy; dx  Stage IV colon cancer mets to liver   Anxiety    Depression    Hypertension    followed by dr edsel and oncology  (05-27-2020 per pt does not have pcp yet)   IDA (iron deficiency anemia)    Pulmonary nodule, right     Surgical History: Past Surgical History:  Procedure Laterality Date   ABDOMINAL HYSTERECTOMY  03-31-2016   @[facility]    W/  BILATERAL SALPINOOPHORECTOMY    APPLICATION OF WOUND VAC N/A 04/29/2020   Procedure: APPLICATION OF WOUND VAC;  Surgeon: Stevie Herlene Righter, MD;  Location: [facility] OR;  Service: General;  Laterality: N/A;   ENDOMETRIAL ABLATION     LAPAROTOMY N/A 04/29/2020   Procedure: EXPLORATORY LAPAROTOMY;  Surgeon: Stevie Herlene Righter, MD;  Location: [facility] OR;  Service: General;  Laterality: N/A;   PARTIAL COLECTOMY N/A 04/29/2020   Procedure: PARTIAL COLECTOMY WITH END COLOSTOMY;  Surgeon: Stevie Herlene Righter, MD;  Location: [facility] OR;  Service: General;  Laterality: N/A;   PORTACATH  PLACEMENT Right 05/28/2020   Procedure: INSERTION PORT-A-CATH;  Surgeon: Kinsinger, Herlene Righter, MD;  Location: [facility];  Service: General;  Laterality: Right;   SALPINGOOPHORECTOMY Left 03/31/2016   Procedure: LEFT SALPINGO OOPHORECTOMY WITH FROZEN SECTION,  ABDOMINAL HYSTERECTOMY WITH BILATERAL SALPINGO-OOPHORECTOMY;  Surgeon: Norleen LULLA edsel, MD;  Location: [facility];  Service: Gynecology;  Laterality: Left;    Social History: Social History   Socioeconomic History   Marital status: Divorced    Spouse name: Not on file   Number of children: Not on file   Years of education: Not on file   Highest education level: Not on file  Occupational History   Not on file  Tobacco Use   Smoking status: Former    Current packs/day: 0.00    Average packs/day: 0.5 packs/day for 5.0 years (2.5 ttl pk-yrs)    Types: Cigarettes    Start date: 03/27/2010    Quit date: 03/28/2015    Years since quitting: 8.8   Smokeless tobacco: Never  Vaping Use   Vaping status: Never Used  Substance and Sexual Activity   Alcohol use: No   Drug use: Never   Sexual activity: Yes    Partners: Male    Birth control/protection: Surgical  Other Topics Concern   Not on file  Social History Narrative   Not on file   Social Drivers of Health   Financial Resource Strain: Low Risk  (11/01/2019)   Overall Financial Resource Strain (CARDIA)    Difficulty of Paying Living Expenses: Not very hard  Food Insecurity: No Food Insecurity (11/01/2019)   Hunger Vital Sign    Worried About Running Out of Food in the Last Year: Never true    Ran Out of Food in the Last Year: Never true  Transportation Needs: No Transportation Needs (11/01/2019)   PRAPARE - Administrator, Civil Service (Medical): No    Lack of Transportation (Non-Medical): No  Physical Activity: Insufficiently Active (11/01/2019)   Exercise Vital Sign    Days of Exercise per Week: 2 days    Minutes of Exercise per Session: 30 min   Stress: Stress Concern Present (11/01/2019)   [employer] of Occupational  Health - Occupational Stress Questionnaire    Feeling of Stress : To some extent  Social Connections: Moderately Integrated (11/01/2019)   Social Connection and Isolation Panel    Frequency of Communication with Friends and Family: More than three times a week    Frequency of Social Gatherings with Friends and Family: Once a week    Attends Religious Services: More than 4 times per year    Active Member of [employer] or Organizations: No    Attends Banker Meetings: Never    Marital Status: Living with partner  Intimate Partner Violence: Not At Risk (11/01/2019)   Humiliation, Afraid, Rape, and Kick questionnaire    Fear of Current or Ex-Partner: No    Emotionally Abused: No    Physically Abused: No    Sexually Abused: No    Family History: Family History  Problem Relation Age of Onset   Hypertension Mother    Diabetes Mother    Cirrhosis Father     Current Medications:  Current Outpatient Medications:    amLODipine  (NORVASC ) 10 MG tablet, TAKE 1 TABLET BY MOUTH EVERY DAY, Disp: 90 tablet, Rfl: 2   amLODipine  (NORVASC ) 5 MG tablet, TAKE 1 TABLET (5 MG TOTAL) BY MOUTH DAILY., Disp: 90 tablet, Rfl: 1   BEVACIZUMAB  IV, Inject 5 mg/kg into the vein every 14 (fourteen) days., Disp: , Rfl:    blood glucose meter kit and supplies KIT, Dispense based on patient and insurance preference. Use up to four times daily as directed., Disp: 1 each, Rfl: 6   clonazePAM  (KLONOPIN ) 1 MG tablet, TAKE 1 TABLET (1 MG TOTAL) BY MOUTH IN THE MORNING, AT NOON, AND AT BEDTIME., Disp: 90 tablet, Rfl: 3   fluorouracil  CALGB 19297 in sodium chloride  0.9 % 150 mL, Inject 2,400 mg/m2 into the vein over 48 hr., Disp: , Rfl:    gabapentin  (NEURONTIN ) 100 MG capsule, TAKE 1 CAPSULE (100 MG TOTAL) BY MOUTH 3 TIMES A DAY, Disp: 90 capsule, Rfl: 3   glimepiride  (AMARYL ) 1 MG tablet, TAKE 1 TABLET BY MOUTH EVERY DAY WITH BREAKFAST,  Disp: 90 tablet, Rfl: 1   Lancets (ONETOUCH DELICA PLUS LANCET33G) MISC, Apply topically., Disp: , Rfl:    LEUCOVORIN  CALCIUM  IV, Inject 400 mg/m2 into the vein every 21 ( twenty-one) days., Disp: , Rfl:    lidocaine  (XYLOCAINE ) 2 % solution, Use as directed 15 mLs in the mouth or throat every 6 (six) hours as needed for mouth pain. Swish and spit/swallow every six hours as needed for mouth pain, Disp: 480 mL, Rfl: 3   lidocaine -prilocaine  (EMLA ) cream, Apply 1 Application topically as needed., Disp: 30 g, Rfl: 0   Misc. Devices MISC, Please provide with item number [id number] 2 3/4 [employer] Ostomy bags and item number [id number] Flange 2 3/4, Disp: 5 Box, Rfl: 12   nystatin (MYCOSTATIN) 100000 UNIT/ML suspension, Take by mouth., Disp: , Rfl:    ONETOUCH ULTRA test strip, Use as instructed, Disp: 100 each, Rfl: 6   OXALIPLATIN  IV, Inject into the vein every 14 (fourteen) days., Disp: , Rfl:    oxyCODONE  (OXY IR/ROXICODONE ) 5 MG immediate release tablet, Take 5 mg by mouth every 6 (six) hours as needed. for pain, Disp: , Rfl:    PARoxetine  (PAXIL ) 40 MG tablet, Take 1 tablet (40 mg total) by mouth every morning., Disp: 90 tablet, Rfl: 3   polyethylene glycol (MIRALAX  / GLYCOLAX ) packet, Take 17 g by mouth every other day., Disp: , Rfl:    potassium  chloride (KLOR-CON  M10) 10 MEQ tablet, Take 2 tablets (20 mEq total) by mouth daily., Disp: 180 tablet, Rfl: 3   triamterene -hydrochlorothiazide  (MAXZIDE -25) 37.5-25 MG tablet, TAKE 1 TABLET BY MOUTH EVERY DAY, Disp: 90 tablet, Rfl: 1 No current facility-administered medications for this visit.  Facility-Administered Medications Ordered in Other Visits:    0.9 %  sodium chloride  infusion, , Intravenous, Continuous, Rogers Hai, MD, Last Rate: 10 mL/hr at 01/19/24 1105, New Bag at 01/19/24 1105   atropine  injection 0.5 mg, 0.5 mg, Intravenous, Once, Rogers Hai, MD   bevacizumab -awwb (MVASI ) 400 mg in sodium chloride  0.9 % 100 mL chemo infusion,  5 mg/kg (Order-Specific), Intravenous, Once, Susan Kinser, MD   dexamethasone  (DECADRON ) injection 10 mg, 10 mg, Intravenous, Once, Rogers Hai, MD   fluorouracil  (ADRUCIL ) 3,500 mg in sodium chloride  0.9 % 80 mL chemo infusion, 1,920 mg/m2 (Order-Specific), Intravenous, 1 day or 1 dose, Susan Garza, MD   fluorouracil  (ADRUCIL ) chemo injection 600 mg, 320 mg/m2 (Order-Specific), Intravenous, Once, Rogers Hai, MD   irinotecan  (CAMPTOSAR ) 300 mg in sodium chloride  0.9 % 500 mL chemo infusion, 144 mg/m2 (Order-Specific), Intravenous, Once, Susan Nutter, MD   leucovorin  608 mg in sodium chloride  0.9 % 250 mL infusion, 320 mg/m2 (Order-Specific), Intravenous, Once, Susan Drummond, MD   palonosetron  (ALOXI ) injection 0.25 mg, 0.25 mg, Intravenous, Once, Rogers Hai, MD   potassium chloride  SA (KLOR-CON  M) CR tablet 40 mEq, 40 mEq, Oral, Once, Rogers Hai, MD   Allergies: No Known Allergies  REVIEW OF SYSTEMS:   Review of Systems  Constitutional:  Negative for chills, fatigue and fever.  HENT:   Negative for lump/mass, mouth sores, nosebleeds, sore throat and trouble swallowing.   Eyes:  Negative for eye problems.  Respiratory:  Negative for cough and shortness of breath.   Cardiovascular:  Negative for chest pain, leg swelling and palpitations.  Gastrointestinal:  Positive for diarrhea. Negative for abdominal pain, constipation, nausea and vomiting.  Genitourinary:  Negative for bladder incontinence, difficulty urinating, dysuria, frequency, hematuria and nocturia.   Musculoskeletal:  Negative for arthralgias, back pain, flank pain, myalgias and neck pain.  Skin:  Negative for itching and rash.  Neurological:  Positive for numbness. Negative for dizziness and headaches.  Hematological:  Does not bruise/bleed easily.  Psychiatric/Behavioral:  Negative for depression, sleep disturbance and suicidal ideas. The patient is not  nervous/anxious.   All other systems reviewed and are negative.    VITALS:   There were no vitals taken for this visit.  Wt Readings from Last 3 Encounters:  01/19/24 165 lb 9.6 oz (75.1 kg)  01/05/24 165 lb 8 oz (75.1 kg)  12/22/23 166 lb (75.3 kg)    There is no height or weight on file to calculate BMI.  Performance status (ECOG): 1 - Symptomatic but completely ambulatory  PHYSICAL EXAM:   Physical Exam Vitals and nursing note reviewed. Exam conducted with a chaperone present.  Constitutional:      Appearance: Normal appearance.  Cardiovascular:     Rate and Rhythm: Normal rate and regular rhythm.     Pulses: Normal pulses.     Heart sounds: Normal heart sounds.  Pulmonary:     Effort: Pulmonary effort is normal.     Breath sounds: Normal breath sounds.  Abdominal:     Palpations: Abdomen is soft. There is no hepatomegaly, splenomegaly or mass.     Tenderness: There is no abdominal tenderness.  Musculoskeletal:     Right lower leg: No edema.  Left lower leg: No edema.  Lymphadenopathy:     Cervical: No cervical adenopathy.     Right cervical: No superficial, deep or posterior cervical adenopathy.    Left cervical: No superficial, deep or posterior cervical adenopathy.     Upper Body:     Right upper body: No supraclavicular or axillary adenopathy.     Left upper body: No supraclavicular or axillary adenopathy.  Neurological:     General: No focal deficit present.     Mental Status: She is alert and oriented to person, place, and time.  Psychiatric:        Mood and Affect: Mood normal.        Behavior: Behavior normal.     LABS:   CBC     Component Value Date/Time   WBC 5.1 01/19/2024 0935   RBC 3.83 (L) 01/19/2024 0935   HGB 11.5 (L) 01/19/2024 0935   HGB 14.9 03/05/2016 1201   HCT 36.1 01/19/2024 0935   HCT 43.4 03/05/2016 1201   PLT 84 (L) 01/19/2024 0935   PLT 340 03/05/2016 1201   MCV 94.3 01/19/2024 0935   MCV 90 03/05/2016 1201   MCH 30.0  01/19/2024 0935   MCHC 31.9 01/19/2024 0935   RDW 19.0 (H) 01/19/2024 0935   RDW 12.8 03/05/2016 1201   LYMPHSABS 1.0 01/19/2024 0935   LYMPHSABS 1.0 03/05/2016 1201   MONOABS 0.5 01/19/2024 0935   EOSABS 0.2 01/19/2024 0935   EOSABS 0.0 03/05/2016 1201   BASOSABS 0.0 01/19/2024 0935   BASOSABS 0.0 03/05/2016 1201    CMP      Component Value Date/Time   NA 138 01/19/2024 0935   NA 140 03/05/2016 1201   K 3.2 (L) 01/19/2024 0935   CL 103 01/19/2024 0935   CO2 25 01/19/2024 0935   GLUCOSE 155 (H) 01/19/2024 0935   BUN 6 (L) 01/19/2024 0935   BUN 10 03/05/2016 1201   CREATININE 0.76 01/19/2024 0935   CALCIUM  8.8 (L) 01/19/2024 0935   PROT 6.7 01/19/2024 0935   PROT 7.4 03/05/2016 1201   ALBUMIN 3.3 (L) 01/19/2024 0935   ALBUMIN 4.0 03/05/2016 1201   AST 42 (H) 01/19/2024 0935   ALT 28 01/19/2024 0935   ALKPHOS 182 (H) 01/19/2024 0935   BILITOT 1.0 01/19/2024 0935   BILITOT 0.3 03/05/2016 1201   GFRNONAA >60 01/19/2024 0935   GFRAA >60 04/01/2016 0541     Lab Results  Component Value Date   CEA1 8.0 (H) 01/05/2024   /  CEA  Date Value Ref Range Status  01/05/2024 8.0 (H) 0.0 - 4.7 ng/mL Final    Comment:    (NOTE)                             Nonsmokers          <3.9                             Smokers             <5.6 [employer] Electrochemiluminescence Immunoassay (ECLIA) Values obtained with different assay methods or kits cannot be used interchangeably.  Results cannot be interpreted as absolute evidence of the presence or absence of malignant disease. Performed At: [facility] [address] [place], [place] [id number] Jennette Shorter MD Ey:[phone]    No results found for: PSA1 No results found for: CAN199 No results  found for: CAN125  No results found for: TOTALPROTELP, ALBUMINELP, A1GS, A2GS, BETS, BETA2SER, GAMS, MSPIKE, SPEI Lab Results  Component Value Date   TIBC 248 (L) 04/29/2020   FERRITIN 57  04/29/2020   IRONPCTSAT 3 (L) 04/29/2020   No results found for: LDH   STUDIES:   No results found.

## 2024-01-19 ENCOUNTER — Encounter: Payer: Self-pay | Admitting: Hematology

## 2024-01-19 ENCOUNTER — Inpatient Hospital Stay

## 2024-01-19 ENCOUNTER — Ambulatory Visit

## 2024-01-19 ENCOUNTER — Inpatient Hospital Stay (HOSPITAL_BASED_OUTPATIENT_CLINIC_OR_DEPARTMENT_OTHER): Admitting: Hematology

## 2024-01-19 VITALS — BP 126/71 | HR 77 | Temp 97.7°F | Resp 18

## 2024-01-19 VITALS — BP 139/68 | HR 76 | Temp 97.5°F | Resp 18 | Wt 165.6 lb

## 2024-01-19 DIAGNOSIS — Z95828 Presence of other vascular implants and grafts: Secondary | ICD-10-CM

## 2024-01-19 DIAGNOSIS — C189 Malignant neoplasm of colon, unspecified: Secondary | ICD-10-CM | POA: Diagnosis not present

## 2024-01-19 DIAGNOSIS — C787 Secondary malignant neoplasm of liver and intrahepatic bile duct: Secondary | ICD-10-CM

## 2024-01-19 DIAGNOSIS — Z5111 Encounter for antineoplastic chemotherapy: Secondary | ICD-10-CM | POA: Diagnosis not present

## 2024-01-19 LAB — CBC WITH DIFFERENTIAL/PLATELET
Abs Immature Granulocytes: 0.02 K/uL (ref 0.00–0.07)
Basophils Absolute: 0 K/uL (ref 0.0–0.1)
Basophils Relative: 1 %
Eosinophils Absolute: 0.2 K/uL (ref 0.0–0.5)
Eosinophils Relative: 3 %
HCT: 36.1 % (ref 36.0–46.0)
Hemoglobin: 11.5 g/dL — ABNORMAL LOW (ref 12.0–15.0)
Immature Granulocytes: 0 %
Lymphocytes Relative: 20 %
Lymphs Abs: 1 K/uL (ref 0.7–4.0)
MCH: 30 pg (ref 26.0–34.0)
MCHC: 31.9 g/dL (ref 30.0–36.0)
MCV: 94.3 fL (ref 80.0–100.0)
Monocytes Absolute: 0.5 K/uL (ref 0.1–1.0)
Monocytes Relative: 10 %
Neutro Abs: 3.4 K/uL (ref 1.7–7.7)
Neutrophils Relative %: 66 %
Platelets: 84 K/uL — ABNORMAL LOW (ref 150–400)
RBC: 3.83 MIL/uL — ABNORMAL LOW (ref 3.87–5.11)
RDW: 19 % — ABNORMAL HIGH (ref 11.5–15.5)
WBC: 5.1 K/uL (ref 4.0–10.5)
nRBC: 0 % (ref 0.0–0.2)

## 2024-01-19 LAB — COMPREHENSIVE METABOLIC PANEL WITH GFR
ALT: 28 U/L (ref 0–44)
AST: 42 U/L — ABNORMAL HIGH (ref 15–41)
Albumin: 3.3 g/dL — ABNORMAL LOW (ref 3.5–5.0)
Alkaline Phosphatase: 182 U/L — ABNORMAL HIGH (ref 38–126)
Anion gap: 10 (ref 5–15)
BUN: 6 mg/dL — ABNORMAL LOW (ref 8–23)
CO2: 25 mmol/L (ref 22–32)
Calcium: 8.8 mg/dL — ABNORMAL LOW (ref 8.9–10.3)
Chloride: 103 mmol/L (ref 98–111)
Creatinine, Ser: 0.76 mg/dL (ref 0.44–1.00)
GFR, Estimated: >60 mL/min (ref >60)
Glucose, Bld: 155 mg/dL — ABNORMAL HIGH (ref 70–99)
Potassium: 3.2 mmol/L — ABNORMAL LOW (ref 3.5–5.1)
Sodium: 138 mmol/L (ref 135–145)
Total Bilirubin: 1 mg/dL (ref 0.0–1.2)
Total Protein: 6.7 g/dL (ref 6.5–8.1)

## 2024-01-19 LAB — URINALYSIS, DIPSTICK ONLY
Bilirubin Urine: NEGATIVE
Glucose, UA: NEGATIVE mg/dL
Hgb urine dipstick: NEGATIVE
Ketones, ur: NEGATIVE mg/dL
Leukocytes,Ua: NEGATIVE
Nitrite: NEGATIVE
Protein, ur: NEGATIVE mg/dL
Specific Gravity, Urine: 1.01 (ref 1.005–1.030)
pH: 6 (ref 5.0–8.0)

## 2024-01-19 LAB — MAGNESIUM: Magnesium: 1.9 mg/dL (ref 1.7–2.4)

## 2024-01-19 MED ORDER — SODIUM CHLORIDE 0.9% FLUSH
10.0000 mL | INTRAVENOUS | Status: DC | PRN
  Administered 2024-01-19: 10 mL via INTRAVENOUS

## 2024-01-19 MED ORDER — FLUOROURACIL CHEMO INJECTION 2.5 GM/50ML
320.0000 mg/m2 | Freq: Once | INTRAVENOUS | Status: AC
  Administered 2024-01-19: 600 mg via INTRAVENOUS
  Filled 2024-01-19: qty 12

## 2024-01-19 MED ORDER — SODIUM CHLORIDE 0.9 % IV SOLN
144.0000 mg/m2 | Freq: Once | INTRAVENOUS | Status: AC
  Administered 2024-01-19: 300 mg via INTRAVENOUS
  Filled 2024-01-19: qty 15

## 2024-01-19 MED ORDER — PALONOSETRON HCL INJECTION 0.25 MG/5ML
0.2500 mg | Freq: Once | INTRAVENOUS | Status: AC
  Administered 2024-01-19: 0.25 mg via INTRAVENOUS
  Filled 2024-01-19: qty 5

## 2024-01-19 MED ORDER — SODIUM CHLORIDE 0.9 % IV SOLN
5.0000 mg/kg | Freq: Once | INTRAVENOUS | Status: AC
  Administered 2024-01-19: 400 mg via INTRAVENOUS
  Filled 2024-01-19: qty 16

## 2024-01-19 MED ORDER — DEXAMETHASONE SODIUM PHOSPHATE 10 MG/ML IJ SOLN
10.0000 mg | Freq: Once | INTRAMUSCULAR | Status: AC
  Administered 2024-01-19: 10 mg via INTRAVENOUS
  Filled 2024-01-19: qty 1

## 2024-01-19 MED ORDER — SODIUM CHLORIDE 0.9 % IV SOLN
1920.0000 mg/m2 | INTRAVENOUS | Status: DC
  Administered 2024-01-19: 3500 mg via INTRAVENOUS
  Filled 2024-01-19: qty 70

## 2024-01-19 MED ORDER — SODIUM CHLORIDE 0.9 % IV SOLN: INTRAVENOUS | Status: DC

## 2024-01-19 MED ORDER — SODIUM CHLORIDE 0.9 % IV SOLN
320.0000 mg/m2 | Freq: Once | INTRAVENOUS | Status: AC
  Administered 2024-01-19: 608 mg via INTRAVENOUS
  Filled 2024-01-19: qty 30.4

## 2024-01-19 MED ORDER — POTASSIUM CHLORIDE CRYS ER 20 MEQ PO TBCR
40.0000 meq | EXTENDED_RELEASE_TABLET | Freq: Once | ORAL | Status: AC
  Administered 2024-01-19: 40 meq via ORAL
  Filled 2024-01-19: qty 2

## 2024-01-19 MED ORDER — ATROPINE SULFATE 1 MG/ML IV SOLN
0.5000 mg | Freq: Once | INTRAVENOUS | Status: AC
  Administered 2024-01-19: 0.5 mg via INTRAVENOUS
  Filled 2024-01-19: qty 1

## 2024-01-19 NOTE — Progress Notes (Signed)
 Patients port flushed without difficulty.  Good blood return noted with no bruising or swelling noted at site.  Patient remains accessed for treatment.

## 2024-01-19 NOTE — Patient Instructions (Signed)

## 2024-01-19 NOTE — Progress Notes (Signed)
 Continue irinotecan  300 mg due to Epic rounding.

## 2024-01-19 NOTE — Progress Notes (Signed)
 Patient presents today for MVASI /Folfiri infusion. Patient is in satisfactory condition with no new complaints voiced.  Vital signs are stable.  Labs reviewed by Dr. Rogers during the office visit and all other labs are within treatment parameters. Patient's platelets noted to be 84 today. Patient will receive 40 mEq potassium chloride  p.o x 1 dose. We will proceed with treatment per MD orders.   Treatment given today per MD orders. Tolerated infusion without adverse affects. Vital signs stable. No complaints at this time. Discharged from clinic ambulatory in stable condition. Alert and oriented x 3. F/U with [facility] as scheduled. 5FU ambulatory pump infusing with no alarms beeping.

## 2024-01-19 NOTE — Patient Instructions (Signed)
 [facility] [place] - [facility] DE[facility]  Discharge Instructions: Thank you for choosing [facility] to provide your oncology and hematology care.  If you have a lab appointment with the Cancer Center - please note that after April 8th, 2024, all labs will be drawn in the cancer center.  You do not have to check in or register with the main entrance as you have in the past but will complete your check-in in the cancer center.  Wear comfortable clothing and clothing appropriate for easy access to any Portacath or PICC line.   We strive to give you quality time with your provider. You may need to reschedule your appointment if you arrive late (15 or more minutes).  Arriving late affects you and other patients whose appointments are after yours.  Also, if you miss three or more appointments without notifying the office, you may be dismissed from the clinic at the provider's discretion.      For prescription refill requests, have your pharmacy contact our office and allow 72 hours for refills to be completed.    Today you received the following chemotherapy and/or immunotherapy agents MVASI/Folfiri   To help prevent nausea and vomiting after your treatment, we encourage you to take your nausea medication as directed.   BELOW ARE SYMPTOMS THAT SHOULD BE REPORTED IMMEDIATELY: *FEVER GREATER THAN 100.4 F (38 C) OR HIGHER *CHILLS OR SWEATING *NAUSEA AND VOMITING THAT IS NOT CONTROLLED WITH YOUR NAUSEA MEDICATION *UNUSUAL SHORTNESS OF BREATH *UNUSUAL BRUISING OR BLEEDING *URINARY PROBLEMS (pain or burning when urinating, or frequent urination) *BOWEL PROBLEMS (unusual diarrhea, constipation, pain near the anus) TENDERNESS IN MOUTH AND THROAT WITH OR WITHOUT PRESENCE OF ULCERS (sore throat, sores in mouth, or a toothache) UNUSUAL RASH, SWELLING OR PAIN  UNUSUAL VAGINAL DISCHARGE OR ITCHING   Items with * indicate a potential emergency and should be followed  up as soon as possible or go to the Emergency Department if any problems should occur.  Please show the CHEMOTHERAPY ALERT CARD or IMMUNOTHERAPY ALERT CARD at check-in to the Emergency Department and triage nurse.  Should you have questions after your visit or need to cancel or reschedule your appointment, please contact [facility]ER [facility] [place] - A DEPT OF Eligha Bridegroom [facility] [phone]  and follow the prompts.  Office hours are 8:00 a.m. to 4:30 p.m. Monday - Friday. Please note that voicemails left after 4:00 p.m. may not be returned until the following business day.  We are closed weekends and major holidays. You have access to a nurse at all times for urgent questions. Please call the main number to the clinic [phone] and follow the prompts.  For any non-urgent questions, you may also contact your provider using MyChart. We now offer e-Visits for anyone 8 and older to request care online for non-urgent symptoms. For details visit [email or web address].   Also download the MyChart app! Go to the app store, search "MyChart", open the app, select [place], and log in with your MyChart username and password.

## 2024-01-19 NOTE — Progress Notes (Signed)
Patient has been examined by Dr. Ellin Saba. Vital signs and labs have been reviewed by MD - ANC, Creatinine, LFTs, hemoglobin, and platelets (84) are within treatment parameters per M.D. - pt may proceed with treatment.  Primary RN and pharmacy notified.

## 2024-01-20 ENCOUNTER — Other Ambulatory Visit: Payer: Self-pay

## 2024-01-20 ENCOUNTER — Ambulatory Visit

## 2024-01-20 ENCOUNTER — Ambulatory Visit
Admission: RE | Admit: 2024-01-20 | Discharge: 2024-01-20 | Disposition: A | Discharge status: Home / Self Care | Source: Ambulatory Visit | Attending: Radiation Oncology | Admitting: Radiation Oncology

## 2024-01-20 DIAGNOSIS — Z510 Encounter for antineoplastic radiation therapy: Secondary | ICD-10-CM | POA: Diagnosis not present

## 2024-01-20 LAB — RAD ONC ARIA SESSION SUMMARY
Course Elapsed Days: 10
Plan Fractions Treated to Date: 8
Plan Prescribed Dose Per Fraction: 6 Gy
Plan Total Fractions Prescribed: 10
Plan Total Prescribed Dose: 60 Gy
Reference Point Dosage Given to Date: 48 Gy
Reference Point Session Dosage Given: 6 Gy
Session Number: 8

## 2024-01-21 ENCOUNTER — Other Ambulatory Visit: Payer: Self-pay

## 2024-01-21 ENCOUNTER — Ambulatory Visit

## 2024-01-21 ENCOUNTER — Inpatient Hospital Stay

## 2024-01-21 ENCOUNTER — Ambulatory Visit
Admission: RE | Admit: 2024-01-21 | Discharge: 2024-01-21 | Disposition: A | Discharge status: Home / Self Care | Source: Ambulatory Visit | Attending: Radiation Oncology | Admitting: Radiation Oncology

## 2024-01-21 DIAGNOSIS — Z5111 Encounter for antineoplastic chemotherapy: Secondary | ICD-10-CM | POA: Diagnosis not present

## 2024-01-21 DIAGNOSIS — C189 Malignant neoplasm of colon, unspecified: Secondary | ICD-10-CM

## 2024-01-21 DIAGNOSIS — Z510 Encounter for antineoplastic radiation therapy: Secondary | ICD-10-CM | POA: Diagnosis not present

## 2024-01-21 LAB — RAD ONC ARIA SESSION SUMMARY
Course Elapsed Days: 11
Plan Fractions Treated to Date: 9
Plan Prescribed Dose Per Fraction: 6 Gy
Plan Total Fractions Prescribed: 10
Plan Total Prescribed Dose: 60 Gy
Reference Point Dosage Given to Date: 54 Gy
Reference Point Session Dosage Given: 6 Gy
Session Number: 9

## 2024-01-21 LAB — CEA: CEA: 8.9 ng/mL — ABNORMAL HIGH (ref 0.0–4.7)

## 2024-01-21 MED ORDER — HEPARIN SOD (PORK) LOCK FLUSH 100 UNIT/ML IV SOLN
500.0000 [IU] | Freq: Once | INTRAVENOUS | Status: AC | PRN
  Administered 2024-01-21: 500 [IU]

## 2024-01-21 MED ORDER — PEGFILGRASTIM-JMDB 6 MG/0.6ML ~~LOC~~ SOSY
6.0000 mg | PREFILLED_SYRINGE | Freq: Once | SUBCUTANEOUS | Status: AC
Start: 2024-01-21 — End: 2024-01-21
  Administered 2024-01-21: 6 mg via SUBCUTANEOUS
  Filled 2024-01-21: qty 0.6

## 2024-01-21 MED ORDER — SODIUM CHLORIDE 0.9% FLUSH
10.0000 mL | INTRAVENOUS | Status: DC | PRN
  Administered 2024-01-21: 10 mL

## 2024-01-21 NOTE — Patient Instructions (Signed)

## 2024-01-21 NOTE — Progress Notes (Signed)
 Patients port flushed without difficulty.  Good blood return noted with no bruising or swelling noted at site. Home infusion 5FU pump disconnected.  Band aid applied.  VSS with discharge and left in satisfactory condition with no s/s of distress noted.

## 2024-01-24 ENCOUNTER — Other Ambulatory Visit: Payer: Self-pay | Admitting: *Deleted

## 2024-01-24 ENCOUNTER — Other Ambulatory Visit: Payer: Self-pay

## 2024-01-24 ENCOUNTER — Ambulatory Visit

## 2024-01-24 ENCOUNTER — Other Ambulatory Visit: Payer: Self-pay | Admitting: Radiation Oncology

## 2024-01-24 ENCOUNTER — Ambulatory Visit
Admission: RE | Admit: 2024-01-24 | Discharge: 2024-01-24 | Disposition: A | Discharge status: Home / Self Care | Source: Ambulatory Visit | Attending: Radiation Oncology | Admitting: Radiation Oncology

## 2024-01-24 DIAGNOSIS — Z510 Encounter for antineoplastic radiation therapy: Secondary | ICD-10-CM | POA: Diagnosis not present

## 2024-01-24 DIAGNOSIS — C187 Malignant neoplasm of sigmoid colon: Secondary | ICD-10-CM | POA: Diagnosis not present

## 2024-01-24 DIAGNOSIS — C7801 Secondary malignant neoplasm of right lung: Secondary | ICD-10-CM | POA: Diagnosis not present

## 2024-01-24 LAB — RAD ONC ARIA SESSION SUMMARY
Course Elapsed Days: 14
Plan Fractions Treated to Date: 10
Plan Prescribed Dose Per Fraction: 6 Gy
Plan Total Fractions Prescribed: 10
Plan Total Prescribed Dose: 60 Gy
Reference Point Dosage Given to Date: 60 Gy
Reference Point Session Dosage Given: 6 Gy
Session Number: 10

## 2024-01-24 MED ORDER — DEXAMETHASONE 2 MG PO TABS: 2.0000 mg | ORAL_TABLET | Freq: Two times a day (BID) | ORAL | 0 refills | Status: DC

## 2024-01-24 MED ORDER — LANSOPRAZOLE 30 MG PO CPDR: 30.0000 mg | DELAYED_RELEASE_CAPSULE | Freq: Every day | ORAL | 0 refills | Status: AC

## 2024-01-25 ENCOUNTER — Ambulatory Visit

## 2024-01-25 ENCOUNTER — Other Ambulatory Visit: Payer: Self-pay | Admitting: *Deleted

## 2024-01-25 MED ORDER — DEXAMETHASONE 2 MG PO TABS: 2.0000 mg | ORAL_TABLET | ORAL | 0 refills | Status: DC

## 2024-01-26 ENCOUNTER — Ambulatory Visit

## 2024-01-27 ENCOUNTER — Ambulatory Visit

## 2024-01-31 ENCOUNTER — Ambulatory Visit

## 2024-02-01 ENCOUNTER — Ambulatory Visit

## 2024-02-02 ENCOUNTER — Inpatient Hospital Stay

## 2024-02-02 ENCOUNTER — Inpatient Hospital Stay: Attending: Hematology

## 2024-02-02 ENCOUNTER — Ambulatory Visit

## 2024-02-02 ENCOUNTER — Encounter: Payer: Self-pay | Admitting: Hematology

## 2024-02-02 VITALS — BP 156/70 | HR 67 | Temp 98.4°F | Resp 18

## 2024-02-02 DIAGNOSIS — C187 Malignant neoplasm of sigmoid colon: Secondary | ICD-10-CM | POA: Diagnosis not present

## 2024-02-02 DIAGNOSIS — Z5189 Encounter for other specified aftercare: Secondary | ICD-10-CM | POA: Diagnosis not present

## 2024-02-02 DIAGNOSIS — C787 Secondary malignant neoplasm of liver and intrahepatic bile duct: Secondary | ICD-10-CM | POA: Insufficient documentation

## 2024-02-02 DIAGNOSIS — Z79899 Other long term (current) drug therapy: Secondary | ICD-10-CM | POA: Diagnosis not present

## 2024-02-02 DIAGNOSIS — C189 Malignant neoplasm of colon, unspecified: Secondary | ICD-10-CM

## 2024-02-02 DIAGNOSIS — Z5111 Encounter for antineoplastic chemotherapy: Secondary | ICD-10-CM | POA: Diagnosis present

## 2024-02-02 LAB — CBC WITH DIFFERENTIAL/PLATELET
Abs Granulocyte: 5.8 K/uL (ref 1.5–6.5)
Abs Immature Granulocytes: 0.07 K/uL (ref 0.00–0.07)
Basophils Absolute: 0 K/uL (ref 0.0–0.1)
Basophils Relative: 0 %
Eosinophils Absolute: 0.1 K/uL (ref 0.0–0.5)
Eosinophils Relative: 1 %
HCT: 35.3 % — ABNORMAL LOW (ref 36.0–46.0)
Hemoglobin: 11.6 g/dL — ABNORMAL LOW (ref 12.0–15.0)
Immature Granulocytes: 1 %
Lymphocytes Relative: 9 %
Lymphs Abs: 0.6 K/uL — ABNORMAL LOW (ref 0.7–4.0)
MCH: 30.2 pg (ref 26.0–34.0)
MCHC: 32.9 g/dL (ref 30.0–36.0)
MCV: 91.9 fL (ref 80.0–100.0)
Monocytes Absolute: 0.6 K/uL (ref 0.1–1.0)
Monocytes Relative: 9 %
Neutro Abs: 5.8 K/uL (ref 1.7–7.7)
Neutrophils Relative %: 80 %
Platelets: 73 K/uL — ABNORMAL LOW (ref 150–400)
RBC: 3.84 MIL/uL — ABNORMAL LOW (ref 3.87–5.11)
RDW: 18.7 % — ABNORMAL HIGH (ref 11.5–15.5)
WBC: 7.2 K/uL (ref 4.0–10.5)
nRBC: 0 % (ref 0.0–0.2)

## 2024-02-02 LAB — COMPREHENSIVE METABOLIC PANEL WITH GFR
ALT: 68 U/L — ABNORMAL HIGH (ref 0–44)
AST: 62 U/L — ABNORMAL HIGH (ref 15–41)
Albumin: 3.4 g/dL — ABNORMAL LOW (ref 3.5–5.0)
Alkaline Phosphatase: 180 U/L — ABNORMAL HIGH (ref 38–126)
Anion gap: 10 (ref 5–15)
BUN: 10 mg/dL (ref 8–23)
CO2: 25 mmol/L (ref 22–32)
Calcium: 8.8 mg/dL — ABNORMAL LOW (ref 8.9–10.3)
Chloride: 100 mmol/L (ref 98–111)
Creatinine, Ser: 0.71 mg/dL (ref 0.44–1.00)
GFR, Estimated: >60 mL/min (ref >60)
Glucose, Bld: 138 mg/dL — ABNORMAL HIGH (ref 70–99)
Potassium: 3.5 mmol/L (ref 3.5–5.1)
Sodium: 135 mmol/L (ref 135–145)
Total Bilirubin: 1 mg/dL (ref 0.0–1.2)
Total Protein: 6.6 g/dL (ref 6.5–8.1)

## 2024-02-02 LAB — MAGNESIUM: Magnesium: 2.2 mg/dL (ref 1.7–2.4)

## 2024-02-02 MED ORDER — DEXAMETHASONE SODIUM PHOSPHATE 10 MG/ML IJ SOLN
10.0000 mg | Freq: Once | INTRAMUSCULAR | Status: AC
  Administered 2024-02-02: 10 mg via INTRAVENOUS
  Filled 2024-02-02: qty 1

## 2024-02-02 MED ORDER — SODIUM CHLORIDE 0.9 % IV SOLN: INTRAVENOUS | Status: DC

## 2024-02-02 MED ORDER — SODIUM CHLORIDE 0.9 % IV SOLN
1920.0000 mg/m2 | INTRAVENOUS | Status: DC
  Administered 2024-02-02: 3500 mg via INTRAVENOUS
  Filled 2024-02-02: qty 70

## 2024-02-02 MED ORDER — ATROPINE SULFATE 1 MG/ML IV SOLN
0.5000 mg | Freq: Once | INTRAVENOUS | Status: AC
  Administered 2024-02-02: 0.5 mg via INTRAVENOUS
  Filled 2024-02-02: qty 1

## 2024-02-02 MED ORDER — SODIUM CHLORIDE 0.9 % IV SOLN
5.0000 mg/kg | Freq: Once | INTRAVENOUS | Status: AC
  Administered 2024-02-02: 400 mg via INTRAVENOUS
  Filled 2024-02-02: qty 16

## 2024-02-02 MED ORDER — PALONOSETRON HCL INJECTION 0.25 MG/5ML
0.2500 mg | Freq: Once | INTRAVENOUS | Status: AC
  Administered 2024-02-02: 0.25 mg via INTRAVENOUS
  Filled 2024-02-02: qty 5

## 2024-02-02 MED ORDER — SODIUM CHLORIDE 0.9 % IV SOLN
320.0000 mg/m2 | Freq: Once | INTRAVENOUS | Status: AC
  Administered 2024-02-02: 608 mg via INTRAVENOUS
  Filled 2024-02-02: qty 30.4

## 2024-02-02 MED ORDER — FLUOROURACIL CHEMO INJECTION 2.5 GM/50ML
320.0000 mg/m2 | Freq: Once | INTRAVENOUS | Status: AC
  Administered 2024-02-02: 600 mg via INTRAVENOUS
  Filled 2024-02-02: qty 12

## 2024-02-02 MED ORDER — SODIUM CHLORIDE 0.9 % IV SOLN
140.0000 mg/m2 | Freq: Once | INTRAVENOUS | Status: AC
  Administered 2024-02-02: 260 mg via INTRAVENOUS
  Filled 2024-02-02: qty 13

## 2024-02-02 NOTE — Progress Notes (Signed)
 Adjust dose of irinotecan  to 260 mg with today's BSA 1.79m2  EPIC is rounding to 300 mg ~>10%  V.O. Dr Theadore Molt, PharmD

## 2024-02-02 NOTE — Progress Notes (Signed)
 Patient presents today for treatment, Platelets 73. AST and ALT elevated since last treatment. Dr. Katragadda made aware, okay to proceed with treatment.  Patient tolerated chemotherapy with no complaints voiced. Side effects with management reviewed understanding verbalized. Port site clean and dry with no bruising or swelling noted at site. Good blood return noted before and after administration of chemotherapy. Chemo pump connected with no alarms noted. Patient left in satisfactory condition with VSS and no s/s of distress noted.

## 2024-02-02 NOTE — Patient Instructions (Signed)
 [facility] [place] - A DEPT OF [facility]  Discharge Instructions: Thank you for choosing [facility] to provide your oncology and hematology care.  If you have a lab appointment with the Cancer Center - please note that after April 8th, 2024, all labs will be drawn in the cancer center.  You do not have to check in or register with the main entrance as you have in the past but will complete your check-in in the cancer center.  Wear comfortable clothing and clothing appropriate for easy access to any Portacath or PICC line.   We strive to give you quality time with your provider. You may need to reschedule your appointment if you arrive late (15 or more minutes).  Arriving late affects you and other patients whose appointments are after yours.  Also, if you miss three or more appointments without notifying the office, you may be dismissed from the clinic at the provider's discretion.      For prescription refill requests, have your pharmacy contact our office and allow 72 hours for refills to be completed.    Today you received the following chemotherapy and/or immunotherapy agents FOLFIRI, return as scheduled.    To help prevent nausea and vomiting after your treatment, we encourage you to take your nausea medication as directed.  BELOW ARE SYMPTOMS THAT SHOULD BE REPORTED IMMEDIATELY: *FEVER GREATER THAN 100.4 F (38 C) OR HIGHER *CHILLS OR SWEATING *NAUSEA AND VOMITING THAT IS NOT CONTROLLED WITH YOUR NAUSEA MEDICATION *UNUSUAL SHORTNESS OF BREATH *UNUSUAL BRUISING OR BLEEDING *URINARY PROBLEMS (pain or burning when urinating, or frequent urination) *BOWEL PROBLEMS (unusual diarrhea, constipation, pain near the anus) TENDERNESS IN MOUTH AND THROAT WITH OR WITHOUT PRESENCE OF ULCERS (sore throat, sores in mouth, or a toothache) UNUSUAL RASH, SWELLING OR PAIN  UNUSUAL VAGINAL DISCHARGE OR ITCHING   Items with * indicate a potential emergency and  should be followed up as soon as possible or go to the Emergency Department if any problems should occur.  Please show the CHEMOTHERAPY ALERT CARD or IMMUNOTHERAPY ALERT CARD at check-in to the Emergency Department and triage nurse.  Should you have questions after your visit or need to cancel or reschedule your appointment, please contact [facility]  - A DEPT OF Eligha Bridegroom [facility] [phone]  and follow the prompts.  Office hours are 8:00 a.m. to 4:30 p.m. Monday - Friday. Please note that voicemails left after 4:00 p.m. may not be returned until the following business day.  We are closed weekends and major holidays. You have access to a nurse at all times for urgent questions. Please call the main number to the clinic [phone] and follow the prompts.  For any non-urgent questions, you may also contact your provider using MyChart. We now offer e-Visits for anyone 21 and older to request care online for non-urgent symptoms. For details visit [email or web address].   Also download the MyChart app! Go to the app store, search "MyChart", open the app, select [place], and log in with your MyChart username and password.

## 2024-02-03 ENCOUNTER — Ambulatory Visit

## 2024-02-03 DIAGNOSIS — C20 Malignant neoplasm of rectum: Secondary | ICD-10-CM | POA: Diagnosis not present

## 2024-02-03 LAB — CEA: CEA: 9.5 ng/mL — ABNORMAL HIGH (ref 0.0–4.7)

## 2024-02-04 ENCOUNTER — Inpatient Hospital Stay

## 2024-02-04 ENCOUNTER — Encounter: Payer: Self-pay | Admitting: Hematology

## 2024-02-04 VITALS — BP 147/79 | HR 75 | Temp 97.2°F | Resp 18

## 2024-02-04 DIAGNOSIS — Z5111 Encounter for antineoplastic chemotherapy: Secondary | ICD-10-CM | POA: Diagnosis not present

## 2024-02-04 DIAGNOSIS — C189 Malignant neoplasm of colon, unspecified: Secondary | ICD-10-CM

## 2024-02-04 MED ORDER — PEGFILGRASTIM-JMDB 6 MG/0.6ML ~~LOC~~ SOSY
6.0000 mg | PREFILLED_SYRINGE | Freq: Once | SUBCUTANEOUS | Status: AC
  Administered 2024-02-04: 6 mg via SUBCUTANEOUS

## 2024-02-04 NOTE — Progress Notes (Signed)
 Patient presents today for 5FU chemotherapy pump disconnection and Fulphila  injection per provider's order. Vital signs stable and patient voiced no new complaints at this time.   Port flushed easily with 20 mL of normal saline  Good blood return noted and needle removed intact. No bruising or swelling noted at the site.  Discharged from clinic ambulatory in stable condition. Alert and oriented x 3. F/U with [facility] as scheduled.

## 2024-02-04 NOTE — Patient Instructions (Signed)
 [facility] [place] - A DEPT OF [facility]  Discharge Instructions: Thank you for choosing [facility] to provide your oncology and hematology care.  If you have a lab appointment with the Cancer Center - please note that after April 8th, 2024, all labs will be drawn in the cancer center.  You do not have to check in or register with the main entrance as you have in the past but will complete your check-in in the cancer center.  Wear comfortable clothing and clothing appropriate for easy access to any Portacath or PICC line.   We strive to give you quality time with your provider. You may need to reschedule your appointment if you arrive late (15 or more minutes).  Arriving late affects you and other patients whose appointments are after yours.  Also, if you miss three or more appointments without notifying the office, you may be dismissed from the clinic at the provider's discretion.      For prescription refill requests, have your pharmacy contact our office and allow 72 hours for refills to be completed.    Today you received 5FU chemotherapy pump disconnection and Fulphila  injection.      BELOW ARE SYMPTOMS THAT SHOULD BE REPORTED IMMEDIATELY: *FEVER GREATER THAN 100.4 F (38 C) OR HIGHER *CHILLS OR SWEATING *NAUSEA AND VOMITING THAT IS NOT CONTROLLED WITH YOUR NAUSEA MEDICATION *UNUSUAL SHORTNESS OF BREATH *UNUSUAL BRUISING OR BLEEDING *URINARY PROBLEMS (pain or burning when urinating, or frequent urination) *BOWEL PROBLEMS (unusual diarrhea, constipation, pain near the anus) TENDERNESS IN MOUTH AND THROAT WITH OR WITHOUT PRESENCE OF ULCERS (sore throat, sores in mouth, or a toothache) UNUSUAL RASH, SWELLING OR PAIN  UNUSUAL VAGINAL DISCHARGE OR ITCHING   Items with * indicate a potential emergency and should be followed up as soon as possible or go to the Emergency Department if any problems should occur.  Please show the CHEMOTHERAPY ALERT  CARD or IMMUNOTHERAPY ALERT CARD at check-in to the Emergency Department and triage nurse.  Should you have questions after your visit or need to cancel or reschedule your appointment, please contact [facility] [place] - A DEPT OF [facility] [phone]  and follow the prompts.  Office hours are 8:00 a.m. to 4:30 p.m. Monday - Friday. Please note that voicemails left after 4:00 p.m. may not be returned until the following business day.  We are closed weekends and major holidays. You have access to a nurse at all times for urgent questions. Please call the main number to the clinic [phone] and follow the prompts.  For any non-urgent questions, you may also contact your provider using MyChart. We now offer e-Visits for anyone 51 and older to request care online for non-urgent symptoms. For details visit [email or web address].   Also download the MyChart app! Go to the app store, search MyChart, open the app, select [place], and log in with your MyChart username and password.

## 2024-02-07 ENCOUNTER — Ambulatory Visit

## 2024-02-08 ENCOUNTER — Other Ambulatory Visit: Payer: Self-pay | Admitting: Radiation Oncology

## 2024-02-08 ENCOUNTER — Ambulatory Visit

## 2024-02-09 ENCOUNTER — Ambulatory Visit

## 2024-02-10 ENCOUNTER — Other Ambulatory Visit: Payer: Self-pay

## 2024-02-10 ENCOUNTER — Ambulatory Visit
Admission: RE | Admit: 2024-02-10 | Discharge: 2024-02-10 | Disposition: A | Discharge status: Home / Self Care | Source: Ambulatory Visit | Attending: Radiation Oncology | Admitting: Radiation Oncology

## 2024-02-10 DIAGNOSIS — C787 Secondary malignant neoplasm of liver and intrahepatic bile duct: Secondary | ICD-10-CM | POA: Insufficient documentation

## 2024-02-10 DIAGNOSIS — C187 Malignant neoplasm of sigmoid colon: Secondary | ICD-10-CM | POA: Insufficient documentation

## 2024-02-10 DIAGNOSIS — Z510 Encounter for antineoplastic radiation therapy: Secondary | ICD-10-CM | POA: Diagnosis not present

## 2024-02-10 DIAGNOSIS — C7801 Secondary malignant neoplasm of right lung: Secondary | ICD-10-CM | POA: Diagnosis not present

## 2024-02-10 DIAGNOSIS — R918 Other nonspecific abnormal finding of lung field: Secondary | ICD-10-CM | POA: Insufficient documentation

## 2024-02-10 LAB — RAD ONC ARIA SESSION SUMMARY
Course Elapsed Days: 31
Plan Fractions Treated to Date: 1
Plan Prescribed Dose Per Fraction: 18 Gy
Plan Total Fractions Prescribed: 3
Plan Total Prescribed Dose: 54 Gy
Reference Point Dosage Given to Date: 18 Gy
Reference Point Session Dosage Given: 18 Gy
Session Number: 11

## 2024-02-14 ENCOUNTER — Other Ambulatory Visit: Payer: Self-pay | Admitting: *Deleted

## 2024-02-14 DIAGNOSIS — Z95828 Presence of other vascular implants and grafts: Secondary | ICD-10-CM

## 2024-02-15 ENCOUNTER — Encounter: Payer: Self-pay | Admitting: Oncology

## 2024-02-15 ENCOUNTER — Other Ambulatory Visit: Payer: Self-pay | Admitting: *Deleted

## 2024-02-15 ENCOUNTER — Ambulatory Visit
Admission: RE | Admit: 2024-02-15 | Discharge: 2024-02-15 | Discharge status: Home / Self Care | Source: Ambulatory Visit | Attending: Radiation Oncology | Admitting: Radiation Oncology

## 2024-02-15 ENCOUNTER — Other Ambulatory Visit: Payer: Self-pay

## 2024-02-15 DIAGNOSIS — Z95828 Presence of other vascular implants and grafts: Secondary | ICD-10-CM

## 2024-02-15 DIAGNOSIS — F419 Anxiety disorder, unspecified: Secondary | ICD-10-CM

## 2024-02-15 DIAGNOSIS — Z510 Encounter for antineoplastic radiation therapy: Secondary | ICD-10-CM | POA: Diagnosis not present

## 2024-02-15 LAB — RAD ONC ARIA SESSION SUMMARY
Course Elapsed Days: 36
Plan Fractions Treated to Date: 2
Plan Prescribed Dose Per Fraction: 18 Gy
Plan Total Fractions Prescribed: 3
Plan Total Prescribed Dose: 54 Gy
Reference Point Dosage Given to Date: 36 Gy
Reference Point Session Dosage Given: 18 Gy
Session Number: 12

## 2024-02-15 MED ORDER — LIDOCAINE-PRILOCAINE 2.5-2.5 % EX CREA: 1.0000 | TOPICAL_CREAM | CUTANEOUS | 0 refills | Status: AC | PRN

## 2024-02-15 MED ORDER — SUCRALFATE 1 G PO TABS: 1.0000 g | ORAL_TABLET | Freq: Two times a day (BID) | ORAL | 1 refills | Status: AC

## 2024-02-16 ENCOUNTER — Inpatient Hospital Stay

## 2024-02-16 ENCOUNTER — Encounter: Payer: Self-pay | Admitting: Oncology

## 2024-02-16 VITALS — BP 141/71 | HR 76 | Temp 98.0°F | Resp 18 | Ht 63.0 in

## 2024-02-16 DIAGNOSIS — Z5111 Encounter for antineoplastic chemotherapy: Secondary | ICD-10-CM | POA: Diagnosis not present

## 2024-02-16 DIAGNOSIS — C189 Malignant neoplasm of colon, unspecified: Secondary | ICD-10-CM

## 2024-02-16 DIAGNOSIS — C20 Malignant neoplasm of rectum: Secondary | ICD-10-CM | POA: Diagnosis not present

## 2024-02-16 LAB — URINALYSIS, DIPSTICK ONLY
Bilirubin Urine: NEGATIVE
Glucose, UA: NEGATIVE mg/dL
Hgb urine dipstick: NEGATIVE
Ketones, ur: NEGATIVE mg/dL
Nitrite: NEGATIVE
Protein, ur: 30 mg/dL — AB
Specific Gravity, Urine: 1.015 (ref 1.005–1.030)
pH: 6 (ref 5.0–8.0)

## 2024-02-16 LAB — CBC WITH DIFFERENTIAL/PLATELET
Abs Granulocyte: 4.2 K/uL (ref 1.5–6.5)
Abs Immature Granulocytes: 0.03 K/uL (ref 0.00–0.07)
Basophils Absolute: 0 K/uL (ref 0.0–0.1)
Basophils Relative: 1 %
Eosinophils Absolute: 0.1 K/uL (ref 0.0–0.5)
Eosinophils Relative: 2 %
HCT: 34.7 % — ABNORMAL LOW (ref 36.0–46.0)
Hemoglobin: 11.2 g/dL — ABNORMAL LOW (ref 12.0–15.0)
Immature Granulocytes: 1 %
Lymphocytes Relative: 11 %
Lymphs Abs: 0.6 K/uL — ABNORMAL LOW (ref 0.7–4.0)
MCH: 30.7 pg (ref 26.0–34.0)
MCHC: 32.3 g/dL (ref 30.0–36.0)
MCV: 95.1 fL (ref 80.0–100.0)
Monocytes Absolute: 0.5 K/uL (ref 0.1–1.0)
Monocytes Relative: 10 %
Neutro Abs: 4.2 K/uL (ref 1.7–7.7)
Neutrophils Relative %: 75 %
Platelets: 70 K/uL — ABNORMAL LOW (ref 150–400)
RBC: 3.65 MIL/uL — ABNORMAL LOW (ref 3.87–5.11)
RDW: 19.8 % — ABNORMAL HIGH (ref 11.5–15.5)
WBC: 5.5 K/uL (ref 4.0–10.5)
nRBC: 0 % (ref 0.0–0.2)

## 2024-02-16 LAB — COMPREHENSIVE METABOLIC PANEL WITH GFR
ALT: 49 U/L — ABNORMAL HIGH (ref 0–44)
AST: 40 U/L (ref 15–41)
Albumin: 3.1 g/dL — ABNORMAL LOW (ref 3.5–5.0)
Alkaline Phosphatase: 184 U/L — ABNORMAL HIGH (ref 38–126)
Anion gap: 11 (ref 5–15)
BUN: 6 mg/dL — ABNORMAL LOW (ref 8–23)
CO2: 24 mmol/L (ref 22–32)
Calcium: 8.4 mg/dL — ABNORMAL LOW (ref 8.9–10.3)
Chloride: 101 mmol/L (ref 98–111)
Creatinine, Ser: 0.81 mg/dL (ref 0.44–1.00)
GFR, Estimated: >60 mL/min (ref >60)
Glucose, Bld: 219 mg/dL — ABNORMAL HIGH (ref 70–99)
Potassium: 3.5 mmol/L (ref 3.5–5.1)
Sodium: 136 mmol/L (ref 135–145)
Total Bilirubin: 1.3 mg/dL — ABNORMAL HIGH (ref 0.0–1.2)
Total Protein: 6.3 g/dL — ABNORMAL LOW (ref 6.5–8.1)

## 2024-02-16 LAB — MAGNESIUM: Magnesium: 2 mg/dL (ref 1.7–2.4)

## 2024-02-16 MED ORDER — SODIUM CHLORIDE 0.9 % IV SOLN: INTRAVENOUS | Status: DC

## 2024-02-16 MED ORDER — INSULIN ASPART 100 UNIT/ML IJ SOLN
10.0000 [IU] | Freq: Once | INTRAMUSCULAR | Status: AC
  Administered 2024-02-16: 10 [IU] via SUBCUTANEOUS
  Filled 2024-02-16: qty 1

## 2024-02-16 MED ORDER — SODIUM CHLORIDE 0.9 % IV SOLN
144.0000 mg/m2 | Freq: Once | INTRAVENOUS | Status: AC
  Administered 2024-02-16: 260 mg via INTRAVENOUS
  Filled 2024-02-16: qty 13

## 2024-02-16 MED ORDER — DEXAMETHASONE SODIUM PHOSPHATE 10 MG/ML IJ SOLN
10.0000 mg | Freq: Once | INTRAMUSCULAR | Status: AC
  Administered 2024-02-16: 10 mg via INTRAVENOUS
  Filled 2024-02-16: qty 1

## 2024-02-16 MED ORDER — SODIUM CHLORIDE 0.9 % IV SOLN
1920.0000 mg/m2 | INTRAVENOUS | Status: DC
  Administered 2024-02-16: 3500 mg via INTRAVENOUS
  Filled 2024-02-16: qty 70

## 2024-02-16 MED ORDER — PALONOSETRON HCL INJECTION 0.25 MG/5ML
0.2500 mg | Freq: Once | INTRAVENOUS | Status: AC
  Administered 2024-02-16: 0.25 mg via INTRAVENOUS
  Filled 2024-02-16: qty 5

## 2024-02-16 MED ORDER — SODIUM CHLORIDE 0.9 % IV SOLN
5.0000 mg/kg | Freq: Once | INTRAVENOUS | Status: AC
  Administered 2024-02-16: 400 mg via INTRAVENOUS
  Filled 2024-02-16: qty 16

## 2024-02-16 MED ORDER — FLUOROURACIL CHEMO INJECTION 2.5 GM/50ML
320.0000 mg/m2 | Freq: Once | INTRAVENOUS | Status: AC
  Administered 2024-02-16: 600 mg via INTRAVENOUS
  Filled 2024-02-16: qty 12

## 2024-02-16 MED ORDER — ATROPINE SULFATE 1 MG/ML IV SOLN
0.5000 mg | Freq: Once | INTRAVENOUS | Status: AC
  Administered 2024-02-16: 0.5 mg via INTRAVENOUS
  Filled 2024-02-16: qty 1

## 2024-02-16 MED ORDER — SODIUM CHLORIDE 0.9 % IV SOLN
320.0000 mg/m2 | Freq: Once | INTRAVENOUS | Status: AC
  Administered 2024-02-16: 608 mg via INTRAVENOUS
  Filled 2024-02-16: qty 30.4

## 2024-02-16 NOTE — Patient Instructions (Signed)
 [facility] - A DEPT OF [facility]  Discharge Instructions: Thank you for choosing [facility] to provide your oncology and hematology care.  If you have a lab appointment with the Cancer Center - please note that after April 8th, 2024, all labs will be drawn in the cancer center.  You do not have to check in or register with the main entrance as you have in the past but will complete your check-in in the cancer center.  Wear comfortable clothing and clothing appropriate for easy access to any Portacath or PICC line.   We strive to give you quality time with your provider. You may need to reschedule your appointment if you arrive late (15 or more minutes).  Arriving late affects you and other patients whose appointments are after yours.  Also, if you miss three or more appointments without notifying the office, you may be dismissed from the clinic at the provider's discretion.      For prescription refill requests, have your pharmacy contact our office and allow 72 hours for refills to be completed.   The chemotherapy medication bag should finish at 46 hours, 96 hours, or 7 days. For example, if your pump is scheduled for 46 hours and it was put on at 4:00 p.m., it should finish at 2:00 p.m. the day it is scheduled to come off regardless of your appointment time.     Estimated time to finish at 12:00 pm.   If the display on your pump reads Low Volume and it is beeping, take the batteries out of the pump and come to the cancer center for it to be taken off.   If the pump alarms go off prior to the pump reading Low Volume then call [phone] and someone can assist you.  If the plunger comes out and the chemotherapy medication is leaking out, please use your home chemo spill kit to clean up the spill. Do NOT use paper towels or other household products.  If you have problems or questions regarding your pump, please call either [phone]  (24 hours a day) or the cancer center Monday-Friday 8:00 a.m.- 4:30 p.m. at the clinic number and we will assist you. If you are unable to get assistance, then go to the nearest Emergency Department and ask the staff to contact the IV team for assistance.    Today you received the following chemotherapy and/or immunotherapy agents MVASI , Irinotecan , Leucovorin , 5FU. Fluorouracil  Injection What is this medication? FLUOROURACIL  (flure oh YOOR a sil) treats some types of cancer. It works by slowing down the growth of cancer cells. This medicine may be used for other purposes; ask your health care provider or pharmacist if you have questions. COMMON BRAND NAME(S): Adrucil  What should I tell my care team before I take this medication? They need to know if you have any of these conditions: Blood disorders Dihydropyrimidine dehydrogenase (DPD) deficiency Infection, such as chickenpox, cold sores, herpes Kidney disease Liver disease Poor nutrition Recent or ongoing radiation therapy An unusual or allergic reaction to fluorouracil , other medications, foods, dyes, or preservatives If you or your partner are pregnant or trying to get pregnant Breast-feeding How should I use this medication? This medication is injected into a vein. It is administered by your care team in a hospital or clinic setting. Talk to your care team about the use of this medication in children. Special care may be needed. Overdosage: If you think you have taken  too much of this medicine contact a poison control center or emergency room at once. NOTE: This medicine is only for you. Do not share this medicine with others. What if I miss a dose? Keep appointments for follow-up doses. It is important not to miss your dose. Call your care team if you are unable to keep an appointment. What may interact with this medication? Do not take this medication with any of the following: Live virus vaccines This medication may also interact  with the following: Medications that treat or prevent blood clots, such as warfarin, enoxaparin , dalteparin This list may not describe all possible interactions. Give your health care provider a list of all the medicines, herbs, non-prescription drugs, or dietary supplements you use. Also tell them if you smoke, drink alcohol, or use illegal drugs. Some items may interact with your medicine. What should I watch for while using this medication? Your condition will be monitored carefully while you are receiving this medication. This medication may make you feel generally unwell. This is not uncommon as chemotherapy can affect healthy cells as well as cancer cells. Report any side effects. Continue your course of treatment even though you feel ill unless your care team tells you to stop. In some cases, you may be given additional medications to help with side effects. Follow all directions for their use. This medication may increase your risk of getting an infection. Call your care team for advice if you get a fever, chills, sore throat, or other symptoms of a cold or flu. Do not treat yourself. Try to avoid being around people who are sick. This medication may increase your risk to bruise or bleed. Call your care team if you notice any unusual bleeding. Be careful brushing or flossing your teeth or using a toothpick because you may get an infection or bleed more easily. If you have any dental work done, tell your dentist you are receiving this medication. Avoid taking medications that contain aspirin, acetaminophen , ibuprofen , naproxen, or ketoprofen unless instructed by your care team. These medications may hide a fever. Do not treat diarrhea with over the counter products. Contact your care team if you have diarrhea that lasts more than 2 days or if it is severe and watery. This medication can make you more sensitive to the sun. Keep out of the sun. If you cannot avoid being in the sun, wear protective  clothing and sunscreen. Do not use sun lamps, tanning beds, or tanning booths. Talk to your care team if you or your partner wish to become pregnant or think you might be pregnant. This medication can cause serious birth defects if taken during pregnancy and for 3 months after the last dose. A reliable form of contraception is recommended while taking this medication and for 3 months after the last dose. Talk to your care team about effective forms of contraception. Do not father a child while taking this medication and for 3 months after the last dose. Use a condom while having sex during this time period. Do not breastfeed while taking this medication. This medication may cause infertility. Talk to your care team if you are concerned about your fertility. What side effects may I notice from receiving this medication? Side effects that you should report to your care team as soon as possible: Allergic reactions--skin rash, itching, hives, swelling of the face, lips, tongue, or throat Heart attack--pain or tightness in the chest, shoulders, arms, or jaw, nausea, shortness of breath, cold or clammy skin,  feeling faint or lightheaded Heart failure--shortness of breath, swelling of the ankles, feet, or hands, sudden weight gain, unusual weakness or fatigue Heart rhythm changes--fast or irregular heartbeat, dizziness, feeling faint or lightheaded, chest pain, trouble breathing High ammonia level--unusual weakness or fatigue, confusion, loss of appetite, nausea, vomiting, seizures Infection--fever, chills, cough, sore throat, wounds that don't heal, pain or trouble when passing urine, general feeling of discomfort or being unwell Low red blood cell level--unusual weakness or fatigue, dizziness, headache, trouble breathing Pain, tingling, or numbness in the hands or feet, muscle weakness, change in vision, confusion or trouble speaking, loss of balance or coordination, trouble walking, seizures Redness,  swelling, and blistering of the skin over hands and feet Severe or prolonged diarrhea Unusual bruising or bleeding Side effects that usually do not require medical attention (report to your care team if they continue or are bothersome): Dry skin Headache Increased tears Nausea Pain, redness, or swelling with sores inside the mouth or throat Sensitivity to light Vomiting This list may not describe all possible side effects. Call your doctor for medical advice about side effects. You may report side effects to FDA at 1-800-FDA-1088. Where should I keep my medication? This medication is given in a hospital or clinic. It will not be stored at home. NOTE: This sheet is a summary. It may not cover all possible information. If you have questions about this medicine, talk to your doctor, pharmacist, or health care provider.  2024 Elsevier/Gold Standard (2021-10-21 00:00:00)Irinotecan  Injection What is this medication? IRINOTECAN  (ir in oh TEE kan) treats some types of cancer. It works by slowing down the growth of cancer cells. This medicine may be used for other purposes; ask your health care provider or pharmacist if you have questions. COMMON BRAND NAME(S): Camptosar  What should I tell my care team before I take this medication? They need to know if you have any of these conditions: Dehydration Diarrhea Infection, especially a viral infection, such as chickenpox, cold sores, herpes Liver disease Low blood cell levels (white cells, red cells, and platelets) Low levels of electrolytes, such as calcium , magnesium, or potassium in your blood Recent or ongoing radiation An unusual or allergic reaction to irinotecan , other medications, foods, dyes, or preservatives If you or your partner are pregnant or trying to get pregnant Breast-feeding How should I use this medication? This medication is injected into a vein. It is given by your care team in a hospital or clinic setting. Talk to your  care team about the use of this medication in children. Special care may be needed. Overdosage: If you think you have taken too much of this medicine contact a poison control center or emergency room at once. NOTE: This medicine is only for you. Do not share this medicine with others. What if I miss a dose? Keep appointments for follow-up doses. It is important not to miss your dose. Call your care team if you are unable to keep an appointment. What may interact with this medication? Do not take this medication with any of the following: Cobicistat Itraconazole This medication may also interact with the following: Certain antibiotics, such as clarithromycin, rifampin, rifabutin Certain antivirals for HIV or AIDS Certain medications for fungal infections, such as ketoconazole, posaconazole, voriconazole Certain medications for seizures, such as carbamazepine, phenobarbital, phenytoin Gemfibrozil Nefazodone St. John's wort This list may not describe all possible interactions. Give your health care provider a list of all the medicines, herbs, non-prescription drugs, or dietary supplements you use. Also tell them  if you smoke, drink alcohol, or use illegal drugs. Some items may interact with your medicine. What should I watch for while using this medication? Your condition will be monitored carefully while you are receiving this medication. You may need blood work while taking this medication. This medication may make you feel generally unwell. This is not uncommon as chemotherapy can affect healthy cells as well as cancer cells. Report any side effects. Continue your course of treatment even though you feel ill unless your care team tells you to stop. This medication can cause serious side effects. To reduce the risk, your care team may give you other medications to take before receiving this one. Be sure to follow the directions from your care team. This medication may affect your coordination,  reaction time, or judgement. Do not drive or operate machinery until you know how this medication affects you. Sit up or stand slowly to reduce the risk of dizzy or fainting spells. Drinking alcohol with this medication can increase the risk of these side effects. This medication may increase your risk of getting an infection. Call your care team for advice if you get a fever, chills, sore throat, or other symptoms of a cold or flu. Do not treat yourself. Try to avoid being around people who are sick. Avoid taking medications that contain aspirin, acetaminophen , ibuprofen , naproxen, or ketoprofen unless instructed by your care team. These medications may hide a fever. This medication may increase your risk to bruise or bleed. Call your care team if you notice any unusual bleeding. Be careful brushing or flossing your teeth or using a toothpick because you may get an infection or bleed more easily. If you have any dental work done, tell your dentist you are receiving this medication. Talk to your care team if you or your partner are pregnant or think either of you might be pregnant. This medication can cause serious birth defects if taken during pregnancy and for 6 months after the last dose. You will need a negative pregnancy test before starting this medication. Contraception is recommended while taking this medication and for 6 months after the last dose. Your care team can help you find the option that works for you. Do not father a child while taking this medication and for 3 months after the last dose. Use a condom for contraception during this time period. Do not breastfeed while taking this medication and for 7 days after the last dose. This medication may cause infertility. Talk to your care team if you are concerned about your fertility. What side effects may I notice from receiving this medication? Side effects that you should report to your care team as soon as possible: Allergic  reactions--skin rash, itching, hives, swelling of the face, lips, tongue, or throat Dry cough, shortness of breath or trouble breathing Increased saliva or tears, increased sweating, stomach cramping, diarrhea, small pupils, unusual weakness or fatigue, slow heartbeat Infection--fever, chills, cough, sore throat, wounds that don't heal, pain or trouble when passing urine, general feeling of discomfort or being unwell Kidney injury--decrease in the amount of urine, swelling of the ankles, hands, or feet Low red blood cell level--unusual weakness or fatigue, dizziness, headache, trouble breathing Severe or prolonged diarrhea Unusual bruising or bleeding Side effects that usually do not require medical attention (report to your care team if they continue or are bothersome): Constipation Diarrhea Hair loss Loss of appetite Nausea Stomach pain This list may not describe all possible side effects. Call your doctor for  medical advice about side effects. You may report side effects to FDA at 1-800-FDA-1088. Where should I keep my medication? This medication is given in a hospital or clinic. It will not be stored at home. NOTE: This sheet is a summary. It may not cover all possible information. If you have questions about this medicine, talk to your doctor, pharmacist, or health care provider.  2024 Elsevier/Gold Standard (2021-10-27 00:00:00)Leucovorin  Injection What is this medication? LEUCOVORIN  (loo koe VOR in) prevents side effects from certain medications, such as methotrexate. It works by increasing folate levels. This helps protect healthy cells in your body. It may also be used to treat anemia caused by low levels of folate. It can also be used with fluorouracil , a type of chemotherapy, to treat colorectal cancer. It works by increasing the effects of fluorouracil  in the body. This medicine may be used for other purposes; ask your health care provider or pharmacist if you have questions. What  should I tell my care team before I take this medication? They need to know if you have any of these conditions: Anemia from low levels of vitamin B12 in the blood An unusual or allergic reaction to leucovorin , folic acid, other medications, foods, dyes, or preservatives Pregnant or trying to get pregnant Breastfeeding How should I use this medication? This medication is injected into a vein or a muscle. It is given by your care team in a hospital or clinic setting. Talk to your care team about the use of this medication in children. Special care may be needed. Overdosage: If you think you have taken too much of this medicine contact a poison control center or emergency room at once. NOTE: This medicine is only for you. Do not share this medicine with others. What if I miss a dose? Keep appointments for follow-up doses. It is important not to miss your dose. Call your care team if you are unable to keep an appointment. What may interact with this medication? Capecitabine Fluorouracil  Phenobarbital Phenytoin Primidone Trimethoprim;sulfamethoxazole This list may not describe all possible interactions. Give your health care provider a list of all the medicines, herbs, non-prescription drugs, or dietary supplements you use. Also tell them if you smoke, drink alcohol, or use illegal drugs. Some items may interact with your medicine. What should I watch for while using this medication? Your condition will be monitored carefully while you are receiving this medication. This medication may increase the side effects of 5-fluorouracil . Tell your care team if you have diarrhea or mouth sores that do not get better or that get worse. What side effects may I notice from receiving this medication? Side effects that you should report to your care team as soon as possible: Allergic reactions--skin rash, itching, hives, swelling of the face, lips, tongue, or throat This list may not describe all possible  side effects. Call your doctor for medical advice about side effects. You may report side effects to FDA at 1-800-FDA-1088. Where should I keep my medication? This medication is given in a hospital or clinic. It will not be stored at home. NOTE: This sheet is a summary. It may not cover all possible information. If you have questions about this medicine, talk to your doctor, pharmacist, or health care provider.  2024 Elsevier/Gold Standard (2021-11-18 00:00:00)Bevacizumab  Injection What is this medication? BEVACIZUMAB  (be va SIZ yoo mab) treats some types of cancer. It works by blocking a protein that causes cancer cells to grow and multiply. This helps to slow or stop the  spread of cancer cells. It is a monoclonal antibody. This medicine may be used for other purposes; ask your health care provider or pharmacist if you have questions. COMMON BRAND NAME(S): Alymsys , Avastin , MVASI , Vegzalma, Zirabev  What should I tell my care team before I take this medication? They need to know if you have any of these conditions: Blood clots Coughing up blood Having or recent surgery Heart failure High blood pressure History of a connection between 2 or more body parts that do not usually connect (fistula) History of a tear in your stomach or intestines Protein in your urine An unusual or allergic reaction to bevacizumab , other medications, foods, dyes, or preservatives Pregnant or trying to get pregnant Breast-feeding How should I use this medication? This medication is injected into a vein. It is given by your care team in a hospital or clinic setting. Talk to your care team the use of this medication in children. Special care may be needed. Overdosage: If you think you have taken too much of this medicine contact a poison control center or emergency room at once. NOTE: This medicine is only for you. Do not share this medicine with others. What if I miss a dose? Keep appointments for follow-up doses.  It is important not to miss your dose. Call your care team if you are unable to keep an appointment. What may interact with this medication? Interactions are not expected. This list may not describe all possible interactions. Give your health care provider a list of all the medicines, herbs, non-prescription drugs, or dietary supplements you use. Also tell them if you smoke, drink alcohol, or use illegal drugs. Some items may interact with your medicine. What should I watch for while using this medication? Your condition will be monitored carefully while you are receiving this medication. You may need blood work while taking this medication. This medication may make you feel generally unwell. This is not uncommon as chemotherapy can affect healthy cells as well as cancer cells. Report any side effects. Continue your course of treatment even though you feel ill unless your care team tells you to stop. This medication may increase your risk to bruise or bleed. Call your care team if you notice any unusual bleeding. Before having surgery, talk to your care team to make sure it is ok. This medication can increase the risk of poor healing of your surgical site or wound. You will need to stop this medication for 28 days before surgery. After surgery, wait at least 28 days before restarting this medication. Make sure the surgical site or wound is healed enough before restarting this medication. Talk to your care team if questions. Talk to your care team if you may be pregnant. Serious birth defects can occur if you take this medication during pregnancy and for 6 months after the last dose. Contraception is recommended while taking this medication and for 6 months after the last dose. Your care team can help you find the option that works for you. Do not breastfeed while taking this medication and for 6 months after the last dose. This medication can cause infertility. Talk to your care team if you are concerned  about your fertility. What side effects may I notice from receiving this medication? Side effects that you should report to your care team as soon as possible: Allergic reactions--skin rash, itching, hives, swelling of the face, lips, tongue, or throat Bleeding--bloody or black, tar-like stools, vomiting blood or brown material that looks like coffee grounds,  red or dark brown urine, small red or purple spots on skin, unusual bruising or bleeding Blood clot--pain, swelling, or warmth in the leg, shortness of breath, chest pain Heart attack--pain or tightness in the chest, shoulders, arms, or jaw, nausea, shortness of breath, cold or clammy skin, feeling faint or lightheaded Heart failure--shortness of breath, swelling of the ankles, feet, or hands, sudden weight gain, unusual weakness or fatigue Increase in blood pressure Infection--fever, chills, cough, sore throat, wounds that don't heal, pain or trouble when passing urine, general feeling of discomfort or being unwell Infusion reactions--chest pain, shortness of breath or trouble breathing, feeling faint or lightheaded Kidney injury--decrease in the amount of urine, swelling of the ankles, hands, or feet Stomach pain that is severe, does not go away, or gets worse Stroke--sudden numbness or weakness of the face, arm, or leg, trouble speaking, confusion, trouble walking, loss of balance or coordination, dizziness, severe headache, change in vision Sudden and severe headache, confusion, change in vision, seizures, which may be signs of posterior reversible encephalopathy syndrome (PRES) Side effects that usually do not require medical attention (report to your care team if they continue or are bothersome): Back pain Change in taste Diarrhea Dry skin Increased tears Nosebleed This list may not describe all possible side effects. Call your doctor for medical advice about side effects. You may report side effects to FDA at 1-800-FDA-1088. Where  should I keep my medication? This medication is given in a hospital or clinic. It will not be stored at home. NOTE: This sheet is a summary. It may not cover all possible information. If you have questions about this medicine, talk to your doctor, pharmacist, or health care provider.  2024 Elsevier/Gold Standard (2021-10-31 00:00:00)      To help prevent nausea and vomiting after your treatment, we encourage you to take your nausea medication as directed.  BELOW ARE SYMPTOMS THAT SHOULD BE REPORTED IMMEDIATELY: *FEVER GREATER THAN 100.4 F (38 C) OR HIGHER *CHILLS OR SWEATING *NAUSEA AND VOMITING THAT IS NOT CONTROLLED WITH YOUR NAUSEA MEDICATION *UNUSUAL SHORTNESS OF BREATH *UNUSUAL BRUISING OR BLEEDING *URINARY PROBLEMS (pain or burning when urinating, or frequent urination) *BOWEL PROBLEMS (unusual diarrhea, constipation, pain near the anus) TENDERNESS IN MOUTH AND THROAT WITH OR WITHOUT PRESENCE OF ULCERS (sore throat, sores in mouth, or a toothache) UNUSUAL RASH, SWELLING OR PAIN  UNUSUAL VAGINAL DISCHARGE OR ITCHING   Items with * indicate a potential emergency and should be followed up as soon as possible or go to the Emergency Department if any problems should occur.  Please show the CHEMOTHERAPY ALERT CARD or IMMUNOTHERAPY ALERT CARD at check-in to the Emergency Department and triage nurse.  Should you have questions after your visit or need to cancel or reschedule your appointment, please contact [facility] [place] - A DEPT OF [facility] [phone]  and follow the prompts.  Office hours are 8:00 a.m. to 4:30 p.m. Monday - Friday. Please note that voicemails left after 4:00 p.m. may not be returned until the following business day.  We are closed weekends and major holidays. You have access to a nurse at all times for urgent questions. Please call the main number to the clinic [phone] and follow the prompts.  For any non-urgent questions, you may  also contact your provider using MyChart. We now offer e-Visits for anyone 33 and older to request care online for non-urgent symptoms. For details visit [email or web address].   Also download the MyChart app! Go  to the app store, search MyChart, open the app, select , and log in with your MyChart username and password.

## 2024-02-16 NOTE — Progress Notes (Signed)
 Patient presents today for MVASI  and Folfiri. Vital signs within parameters for treatment. Glucose 219. Standing orders followed. Labs pending. Platelets 70,000.   Message received to proceed with treatment from Dr. Davonna.   Treatment given today per MD orders. Tolerated infusion without adverse affects. Vital signs stable. No complaints at this time. 5FU pump infusing and RUN noted on screen verified with patient.  Discharged from clinic ambulatory in stable condition. Alert and oriented x 3. F/U with [facility] as scheduled.

## 2024-02-16 NOTE — Progress Notes (Signed)
 Patients port flushed without difficulty.  Good blood return noted with no bruising or swelling noted at site. Patient remains accessed for treatment.

## 2024-02-17 ENCOUNTER — Other Ambulatory Visit: Payer: Self-pay

## 2024-02-17 ENCOUNTER — Ambulatory Visit
Admission: RE | Admit: 2024-02-17 | Discharge: 2024-02-17 | Disposition: A | Discharge status: Home / Self Care | Source: Ambulatory Visit | Attending: Radiation Oncology | Admitting: Radiation Oncology

## 2024-02-17 DIAGNOSIS — C187 Malignant neoplasm of sigmoid colon: Secondary | ICD-10-CM | POA: Diagnosis not present

## 2024-02-17 DIAGNOSIS — C7801 Secondary malignant neoplasm of right lung: Secondary | ICD-10-CM | POA: Diagnosis not present

## 2024-02-17 DIAGNOSIS — Z510 Encounter for antineoplastic radiation therapy: Secondary | ICD-10-CM | POA: Diagnosis not present

## 2024-02-17 LAB — RAD ONC ARIA SESSION SUMMARY
Course Elapsed Days: 38
Plan Fractions Treated to Date: 3
Plan Prescribed Dose Per Fraction: 18 Gy
Plan Total Fractions Prescribed: 3
Plan Total Prescribed Dose: 54 Gy
Reference Point Dosage Given to Date: 54 Gy
Reference Point Session Dosage Given: 18 Gy
Session Number: 13

## 2024-02-17 LAB — CEA: CEA: 11.8 ng/mL — ABNORMAL HIGH (ref 0.0–4.7)

## 2024-02-18 ENCOUNTER — Inpatient Hospital Stay

## 2024-02-18 VITALS — BP 143/74 | HR 77 | Temp 97.7°F | Resp 18

## 2024-02-18 DIAGNOSIS — Z5111 Encounter for antineoplastic chemotherapy: Secondary | ICD-10-CM | POA: Diagnosis not present

## 2024-02-18 DIAGNOSIS — C189 Malignant neoplasm of colon, unspecified: Secondary | ICD-10-CM

## 2024-02-18 MED ORDER — PEGFILGRASTIM-JMDB 6 MG/0.6ML ~~LOC~~ SOSY
6.0000 mg | PREFILLED_SYRINGE | Freq: Once | SUBCUTANEOUS | Status: AC
  Administered 2024-02-18: 6 mg via SUBCUTANEOUS
  Filled 2024-02-18: qty 0.6

## 2024-02-18 NOTE — Telephone Encounter (Signed)
 Patient no longer requires these medications.

## 2024-02-18 NOTE — Radiation Completion Notes (Signed)
 Patient Name: Susan Martin, Susan Martin MRN: [id number] Date of Birth: January 26, 1961 Referring Physician: Alean Stands, M.D. Date of Service: 2024-02-18 Radiation Oncologist: Marcey Penton, M.D. 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  She had been treated with FOLFOX and bevacizumab  as well as maintenance 5-FU.  She currently is on FOLFIRI and bevacizumab .  Recently CT scan this month showed right lung suprahilar nodule increasing in size from 2 x 1.5 cm previously 1.4 x 1 cm as well as a spiculated nodule superior segment the right lower lobe measuring 1.2 x 0.8 cm previously 8 to 9 mm.  Rest of her lung nodules are stable.  Her liver lesions are stable also.  She is referred to radiation oncology for consideration of SBRT treatment to the 2 right lung lesions which are progressing.  She has no significant cough hemoptysis or chest tightness.      ==========DELIVERED PLANS==========  First Treatment Date: 2024-01-10 Last Treatment Date: 2024-02-17   Plan Name: Lung_R_UHRT Site: Lung, Right Technique: IMRT Mode: Photon Dose Per Fraction: 6 Gy Prescribed Dose (Delivered / Prescribed): 60 Gy / 60 Gy Prescribed Fxs (Delivered / Prescribed): 10 / 10   Plan Name: Lung_RLL_SBRT Site: Lung, Right Technique: SBRT/SRT-IMRT Mode: Photon Dose Per Fraction: 18 Gy Prescribed Dose (Delivered / Prescribed): 54 Gy / 54 Gy Prescribed Fxs (Delivered / Prescribed): 3 / 3     ==========ON TREATMENT VISIT DATES========== 2024-01-11, 2024-01-18, 2024-01-24, 2024-02-10, 2024-02-15,  2024-02-15, 2024-02-17     ==========UPCOMING VISITS========== 03/17/2024 [facility] PUMP START STOP AP-[facility] NURSE  03/15/2024 [facility] INFUSION 4HR (240) AP-ACAPA CHAIR 1  03/15/2024 [facility] PORT FLUSH W/LAB AP-ACAPA NURSE  03/15/2024 [facility] OFFICE VISIT Davonna Siad, MD  03/08/2024 [facility] RAD ONCOLOGY FOLLOW UP 30 Penton Marcey, MD  03/07/2024 [facility] IMAGING CT CHEST ABD/PELVIS W CONTRAST [facility]  03/03/2024 [facility] PUMP START STOP AP-ACAPA NURSE  03/01/2024 [facility] INFUSION 4HR (240) AP-ACAPA CHAIR 1  03/01/2024 [facility] PORT FLUSH W/LAB AP-ACAPA NURSE  02/18/2024 [facility] PUMP START STOP AP-ACAPA NURSE        ==========APPENDIX - ON TREATMENT VISIT NOTES==========   See weekly On Treatment Notes in Epic for details in the Media tab (listed as Progress notes on the On Treatment Visit Dates listed above).

## 2024-02-18 NOTE — Progress Notes (Signed)
 Patient tolerated injection with no complaints voiced. Site clean and dry with no bruising or swelling noted at site. See MAR for details. Band aid applied.  Patient stable during and after injection.  Chemo pump disconnected. Port flushed with good blood return noted. No bruising or swelling at site. Bandaid applied and patient discharged in satisfactory condition. VVS stable with no signs or symptoms of distressed noted.

## 2024-02-18 NOTE — Patient Instructions (Signed)
 [facility] [place] - A DEPT OF [place]  Discharge Instructions: Thank you for choosing [facility] to provide your oncology and hematology care.  If you have a lab appointment with the Cancer Center - please note that after April 8th, 2024, all labs will be drawn in the cancer center.  You do not have to check in or register with the main entrance as you have in the past but will complete your check-in in the cancer center.  Wear comfortable clothing and clothing appropriate for easy access to any Portacath or PICC line.   We strive to give you quality time with your provider. You may need to reschedule your appointment if you arrive late (15 or more minutes).  Arriving late affects you and other patients whose appointments are after yours.  Also, if you miss three or more appointments without notifying the office, you may be dismissed from the clinic at the provider's discretion.      For prescription refill requests, have your pharmacy contact our office and allow 72 hours for refills to be completed.    Today you received the following pegfilgrastim  and pump d/c, return as scheduled.   To help prevent nausea and vomiting after your treatment, we encourage you to take your nausea medication as directed.  BELOW ARE SYMPTOMS THAT SHOULD BE REPORTED IMMEDIATELY: *FEVER GREATER THAN 100.4 F (38 C) OR HIGHER *CHILLS OR SWEATING *NAUSEA AND VOMITING THAT IS NOT CONTROLLED WITH YOUR NAUSEA MEDICATION *UNUSUAL SHORTNESS OF BREATH *UNUSUAL BRUISING OR BLEEDING *URINARY PROBLEMS (pain or burning when urinating, or frequent urination) *BOWEL PROBLEMS (unusual diarrhea, constipation, pain near the anus) TENDERNESS IN MOUTH AND THROAT WITH OR WITHOUT PRESENCE OF ULCERS (sore throat, sores in mouth, or a toothache) UNUSUAL RASH, SWELLING OR PAIN  UNUSUAL VAGINAL DISCHARGE OR ITCHING   Items with * indicate a potential emergency and should be followed up as  soon as possible or go to the Emergency Department if any problems should occur.  Please show the CHEMOTHERAPY ALERT CARD or IMMUNOTHERAPY ALERT CARD at check-in to the Emergency Department and triage nurse.  Should you have questions after your visit or need to cancel or reschedule your appointment, please contact [facility] [place] - A DEPT OF [facility] [phone]  and follow the prompts.  Office hours are 8:00 a.m. to 4:30 p.m. Monday - Friday. Please note that voicemails left after 4:00 p.m. may not be returned until the following business day.  We are closed weekends and major holidays. You have access to a nurse at all times for urgent questions. Please call the main number to the clinic [phone] and follow the prompts.  For any non-urgent questions, you may also contact your provider using MyChart. We now offer e-Visits for anyone 45 and older to request care online for non-urgent symptoms. For details visit [email or web address].   Also download the MyChart app! Go to the app store, search MyChart, open the app, select [place], and log in with your MyChart username and password.

## 2024-02-24 ENCOUNTER — Other Ambulatory Visit: Payer: Self-pay | Admitting: *Deleted

## 2024-02-24 ENCOUNTER — Ambulatory Visit: Admitting: Radiation Oncology

## 2024-02-24 MED ORDER — MISC. DEVICES MISC: 12 refills | Status: DC

## 2024-02-29 ENCOUNTER — Ambulatory Visit: Payer: Self-pay

## 2024-02-29 ENCOUNTER — Telehealth: Payer: Self-pay

## 2024-02-29 ENCOUNTER — Other Ambulatory Visit: Payer: Self-pay | Admitting: *Deleted

## 2024-02-29 MED ORDER — MISC. DEVICES MISC: 12 refills | Status: DC

## 2024-02-29 NOTE — Telephone Encounter (Signed)
 Pt calling requesting ostomy supplies. Pt states I do not understand why I can't get supplies for my ostomy. RN advised pt that she was speaking to primary care and that she was not established with [facility] PCP at this time, she would need to speak to her current PCP or oncologist for rx for ostomy supplies. Pt states that she is upset that [facility] isn't helping me, [facility] hung up on me, Tomi cannot get in contact with anyone. Pt was advised numerous times to speak to current PCP or oncologist about ordering supplies. Pt was scheduled with a new pt visit 07/04/24. RN advised pt to contact oncology, her insurance, and her PCP. Pt continued to reiterate that she needed ostomy supplies and her frustration with [facility]. RN clarified that she was speaking with [facility].   Copied from CRM [id number]. Topic: Clinical - Red Word Triage >> Feb 29, 2024  2:34 PM Donna BRAVO wrote: Red Word that prompted transfer to Nurse Triage: patient has stoma , reordered Ostomy supplies, skin around stoma is purplish, sore/hurts to wipe skin around stoma.    Patient had new appt establishing care with Dr Tobie Jacobs [facility]

## 2024-02-29 NOTE — Telephone Encounter (Signed)
 This was sent to the wrong practice    This Copied from CRM [id number]. Topic: Clinical - Order For Equipment >> Feb 29, 2024  2:11 PM Donna BRAVO wrote: Reason for CRM:  Susan Martin [id number] [facility]  asking for orders for Ostomy supplies  Patient was warm transferred to me. Susan Martin stating she can not use the 1 piece, the skin barrier flange. 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    Patient would like 3 boxes of each, due to chemotherapy causing diarrhea  [employer] Skin Barrier   Number # [id number] size 2 3/4 inches   [employer] Drainable Pouch with Integrated Filter   number on box # [id number]   size 2 3/4 inches 70ml    10 in Martin box >> Feb 29, 2024  2:44 PM Donna E wrote: My apologies the patient orders supplies from Oncology provider.  Please disregard this CRM Patient Oncology  Susan Martin, Susan A, RN orders the supplies

## 2024-03-01 ENCOUNTER — Inpatient Hospital Stay

## 2024-03-01 ENCOUNTER — Encounter: Payer: Self-pay | Admitting: Oncology

## 2024-03-01 ENCOUNTER — Other Ambulatory Visit: Payer: Self-pay

## 2024-03-01 ENCOUNTER — Inpatient Hospital Stay: Attending: Hematology

## 2024-03-01 VITALS — BP 127/62 | HR 83 | Temp 98.0°F | Resp 18

## 2024-03-01 DIAGNOSIS — Z7952 Long term (current) use of systemic steroids: Secondary | ICD-10-CM | POA: Insufficient documentation

## 2024-03-01 DIAGNOSIS — Z79899 Other long term (current) drug therapy: Secondary | ICD-10-CM | POA: Diagnosis not present

## 2024-03-01 DIAGNOSIS — G620 Drug-induced polyneuropathy: Secondary | ICD-10-CM | POA: Diagnosis not present

## 2024-03-01 DIAGNOSIS — T451X5A Adverse effect of antineoplastic and immunosuppressive drugs, initial encounter: Secondary | ICD-10-CM | POA: Diagnosis not present

## 2024-03-01 DIAGNOSIS — E876 Hypokalemia: Secondary | ICD-10-CM

## 2024-03-01 DIAGNOSIS — Z933 Colostomy status: Secondary | ICD-10-CM | POA: Diagnosis not present

## 2024-03-01 DIAGNOSIS — R197 Diarrhea, unspecified: Secondary | ICD-10-CM | POA: Diagnosis not present

## 2024-03-01 DIAGNOSIS — R162 Hepatomegaly with splenomegaly, not elsewhere classified: Secondary | ICD-10-CM | POA: Diagnosis not present

## 2024-03-01 DIAGNOSIS — Z5111 Encounter for antineoplastic chemotherapy: Secondary | ICD-10-CM | POA: Insufficient documentation

## 2024-03-01 DIAGNOSIS — C189 Malignant neoplasm of colon, unspecified: Secondary | ICD-10-CM | POA: Diagnosis not present

## 2024-03-01 DIAGNOSIS — D649 Anemia, unspecified: Secondary | ICD-10-CM | POA: Insufficient documentation

## 2024-03-01 DIAGNOSIS — C787 Secondary malignant neoplasm of liver and intrahepatic bile duct: Secondary | ICD-10-CM | POA: Insufficient documentation

## 2024-03-01 DIAGNOSIS — R918 Other nonspecific abnormal finding of lung field: Secondary | ICD-10-CM | POA: Diagnosis not present

## 2024-03-01 DIAGNOSIS — Z7984 Long term (current) use of oral hypoglycemic drugs: Secondary | ICD-10-CM | POA: Insufficient documentation

## 2024-03-01 LAB — MAGNESIUM: Magnesium: 2.2 mg/dL (ref 1.7–2.4)

## 2024-03-01 LAB — CBC WITH DIFFERENTIAL/PLATELET
Abs Immature Granulocytes: 0.1 K/uL — ABNORMAL HIGH (ref 0.00–0.07)
Basophils Absolute: 0 K/uL (ref 0.0–0.1)
Basophils Relative: 0 %
Eosinophils Absolute: 0.1 K/uL (ref 0.0–0.5)
Eosinophils Relative: 1 %
HCT: 34.9 % — ABNORMAL LOW (ref 36.0–46.0)
Hemoglobin: 10.9 g/dL — ABNORMAL LOW (ref 12.0–15.0)
Immature Granulocytes: 1 %
Lymphocytes Relative: 11 %
Lymphs Abs: 1 K/uL (ref 0.7–4.0)
MCH: 30 pg (ref 26.0–34.0)
MCHC: 31.2 g/dL (ref 30.0–36.0)
MCV: 96.1 fL (ref 80.0–100.0)
Monocytes Absolute: 1 K/uL (ref 0.1–1.0)
Monocytes Relative: 11 %
Neutro Abs: 6.9 K/uL (ref 1.7–7.7)
Neutrophils Relative %: 76 %
Platelets: 93 K/uL — ABNORMAL LOW (ref 150–400)
RBC: 3.63 MIL/uL — ABNORMAL LOW (ref 3.87–5.11)
RDW: 21 % — ABNORMAL HIGH (ref 11.5–15.5)
WBC: 9.1 K/uL (ref 4.0–10.5)
nRBC: 0 % (ref 0.0–0.2)

## 2024-03-01 LAB — URINALYSIS, DIPSTICK ONLY
Bilirubin Urine: NEGATIVE
Glucose, UA: NEGATIVE mg/dL
Hgb urine dipstick: NEGATIVE
Ketones, ur: NEGATIVE mg/dL
Leukocytes,Ua: NEGATIVE
Nitrite: NEGATIVE
Protein, ur: NEGATIVE mg/dL
Specific Gravity, Urine: 1.013 (ref 1.005–1.030)
pH: 6 (ref 5.0–8.0)

## 2024-03-01 LAB — COMPREHENSIVE METABOLIC PANEL WITH GFR
ALT: 37 U/L (ref 0–44)
AST: 46 U/L — ABNORMAL HIGH (ref 15–41)
Albumin: 3.4 g/dL — ABNORMAL LOW (ref 3.5–5.0)
Alkaline Phosphatase: 209 U/L — ABNORMAL HIGH (ref 38–126)
Anion gap: 13 (ref 5–15)
BUN: 10 mg/dL (ref 8–23)
CO2: 23 mmol/L (ref 22–32)
Calcium: 8.8 mg/dL — ABNORMAL LOW (ref 8.9–10.3)
Chloride: 99 mmol/L (ref 98–111)
Creatinine, Ser: 0.9 mg/dL (ref 0.44–1.00)
GFR, Estimated: >60 mL/min (ref >60)
Glucose, Bld: 181 mg/dL — ABNORMAL HIGH (ref 70–99)
Potassium: 3.4 mmol/L — ABNORMAL LOW (ref 3.5–5.1)
Sodium: 135 mmol/L (ref 135–145)
Total Bilirubin: 1.3 mg/dL — ABNORMAL HIGH (ref 0.0–1.2)
Total Protein: 6.9 g/dL (ref 6.5–8.1)

## 2024-03-01 MED ORDER — POTASSIUM CHLORIDE CRYS ER 20 MEQ PO TBCR
40.0000 meq | EXTENDED_RELEASE_TABLET | Freq: Once | ORAL | Status: AC
  Administered 2024-03-01: 40 meq via ORAL
  Filled 2024-03-01: qty 2

## 2024-03-01 MED ORDER — SODIUM CHLORIDE 0.9 % IV SOLN
144.0000 mg/m2 | Freq: Once | INTRAVENOUS | Status: AC
  Administered 2024-03-01: 260 mg via INTRAVENOUS
  Filled 2024-03-01: qty 13

## 2024-03-01 MED ORDER — DEXAMETHASONE SODIUM PHOSPHATE 10 MG/ML IJ SOLN
10.0000 mg | Freq: Once | INTRAMUSCULAR | Status: AC
  Administered 2024-03-01: 10 mg via INTRAVENOUS
  Filled 2024-03-01: qty 1

## 2024-03-01 MED ORDER — FLUOROURACIL CHEMO INJECTION 2.5 GM/50ML
320.0000 mg/m2 | Freq: Once | INTRAVENOUS | Status: AC
  Administered 2024-03-01: 600 mg via INTRAVENOUS
  Filled 2024-03-01: qty 12

## 2024-03-01 MED ORDER — SODIUM CHLORIDE 0.9 % IV SOLN
1920.0000 mg/m2 | INTRAVENOUS | Status: DC
  Administered 2024-03-01: 3500 mg via INTRAVENOUS
  Filled 2024-03-01: qty 70

## 2024-03-01 MED ORDER — ATROPINE SULFATE 1 MG/ML IV SOLN
0.5000 mg | Freq: Once | INTRAVENOUS | Status: AC
  Administered 2024-03-01: 0.5 mg via INTRAVENOUS
  Filled 2024-03-01: qty 1

## 2024-03-01 MED ORDER — PALONOSETRON HCL INJECTION 0.25 MG/5ML
0.2500 mg | Freq: Once | INTRAVENOUS | Status: AC
  Administered 2024-03-01: 0.25 mg via INTRAVENOUS
  Filled 2024-03-01: qty 5

## 2024-03-01 MED ORDER — SODIUM CHLORIDE 0.9 % IV SOLN
320.0000 mg/m2 | Freq: Once | INTRAVENOUS | Status: AC
  Administered 2024-03-01: 608 mg via INTRAVENOUS
  Filled 2024-03-01: qty 30.4

## 2024-03-01 MED ORDER — SODIUM CHLORIDE 0.9 % IV SOLN
5.0000 mg/kg | Freq: Once | INTRAVENOUS | Status: AC
  Administered 2024-03-01: 400 mg via INTRAVENOUS
  Filled 2024-03-01: qty 16

## 2024-03-01 MED ORDER — SODIUM CHLORIDE 0.9 % IV SOLN: INTRAVENOUS | Status: DC

## 2024-03-01 NOTE — Patient Instructions (Signed)
 [facility] [place] - A DEPT OF [facility]  Discharge Instructions: Thank you for choosing [facility] to provide your oncology and hematology care.  If you have a lab appointment with the Cancer Center - please note that after April 8th, 2024, all labs will be drawn in the cancer center.  You do not have to check in or register with the main entrance as you have in the past but will complete your check-in in the cancer center.  Wear comfortable clothing and clothing appropriate for easy access to any Portacath or PICC line.   We strive to give you quality time with your provider. You may need to reschedule your appointment if you arrive late (15 or more minutes).  Arriving late affects you and other patients whose appointments are after yours.  Also, if you miss three or more appointments without notifying the office, you may be dismissed from the clinic at the provider's discretion.      For prescription refill requests, have your pharmacy contact our office and allow 72 hours for refills to be completed.    Today you received the following chemotherapy and/or immunotherapy agents MVASI, Irinotecan, Leucovorin, and Adrucil.     Bevacizumab Injection What is this medication? BEVACIZUMAB (be va SIZ yoo mab) treats some types of cancer. It works by blocking a protein that causes cancer cells to grow and multiply. This helps to slow or stop the spread of cancer cells. It is a monoclonal antibody. This medicine may be used for other purposes; ask your health care provider or pharmacist if you have questions. COMMON BRAND NAME(S): Alymsys, Avastin, MVASI, Rosaland Lao What should I tell my care team before I take this medication? They need to know if you have any of these conditions: Blood clots Coughing up blood Having or recent surgery Heart failure High blood pressure History of a connection between 2 or more body parts that do not usually connect  (fistula) History of a tear in your stomach or intestines Protein in your urine An unusual or allergic reaction to bevacizumab, other medications, foods, dyes, or preservatives Pregnant or trying to get pregnant Breast-feeding How should I use this medication? This medication is injected into a vein. It is given by your care team in a hospital or clinic setting. Talk to your care team the use of this medication in children. Special care may be needed. Overdosage: If you think you have taken too much of this medicine contact a poison control center or emergency room at once. NOTE: This medicine is only for you. Do not share this medicine with others. What if I miss a dose? Keep appointments for follow-up doses. It is important not to miss your dose. Call your care team if you are unable to keep an appointment. What may interact with this medication? Interactions are not expected. This list may not describe all possible interactions. Give your health care provider a list of all the medicines, herbs, non-prescription drugs, or dietary supplements you use. Also tell them if you smoke, drink alcohol, or use illegal drugs. Some items may interact with your medicine. What should I watch for while using this medication? Your condition will be monitored carefully while you are receiving this medication. You may need blood work while taking this medication. This medication may make you feel generally unwell. This is not uncommon as chemotherapy can affect healthy cells as well as cancer cells. Report any side effects. Continue your  course of treatment even though you feel ill unless your care team tells you to stop. This medication may increase your risk to bruise or bleed. Call your care team if you notice any unusual bleeding. Before having surgery, talk to your care team to make sure it is ok. This medication can increase the risk of poor healing of your surgical site or wound. You will need to stop  this medication for 28 days before surgery. After surgery, wait at least 28 days before restarting this medication. Make sure the surgical site or wound is healed enough before restarting this medication. Talk to your care team if questions. Talk to your care team if you may be pregnant. Serious birth defects can occur if you take this medication during pregnancy and for 6 months after the last dose. Contraception is recommended while taking this medication and for 6 months after the last dose. Your care team can help you find the option that works for you. Do not breastfeed while taking this medication and for 6 months after the last dose. This medication can cause infertility. Talk to your care team if you are concerned about your fertility. What side effects may I notice from receiving this medication? Side effects that you should report to your care team as soon as possible: Allergic reactions--skin rash, itching, hives, swelling of the face, lips, tongue, or throat Bleeding--bloody or black, tar-like stools, vomiting blood or brown material that looks like coffee grounds, red or dark brown urine, small red or purple spots on skin, unusual bruising or bleeding Blood clot--pain, swelling, or warmth in the leg, shortness of breath, chest pain Heart attack--pain or tightness in the chest, shoulders, arms, or jaw, nausea, shortness of breath, cold or clammy skin, feeling faint or lightheaded Heart failure--shortness of breath, swelling of the ankles, feet, or hands, sudden weight gain, unusual weakness or fatigue Increase in blood pressure Infection--fever, chills, cough, sore throat, wounds that don't heal, pain or trouble when passing urine, general feeling of discomfort or being unwell Infusion reactions--chest pain, shortness of breath or trouble breathing, feeling faint or lightheaded Kidney injury--decrease in the amount of urine, swelling of the ankles, hands, or feet Stomach pain that is  severe, does not go away, or gets worse Stroke--sudden numbness or weakness of the face, arm, or leg, trouble speaking, confusion, trouble walking, loss of balance or coordination, dizziness, severe headache, change in vision Sudden and severe headache, confusion, change in vision, seizures, which may be signs of posterior reversible encephalopathy syndrome (PRES) Side effects that usually do not require medical attention (report to your care team if they continue or are bothersome): Back pain Change in taste Diarrhea Dry skin Increased tears Nosebleed This list may not describe all possible side effects. Call your doctor for medical advice about side effects. You may report side effects to FDA at 1-800-FDA-1088. Where should I keep my medication? This medication is given in a hospital or clinic. It will not be stored at home. NOTE: This sheet is a summary. It may not cover all possible information. If you have questions about this medicine, talk to your doctor, pharmacist, or health care provider.  2024 Elsevier/Gold Standard (2021-10-31 00:00:00)  Irinotecan Injection What is this medication? IRINOTECAN (ir in oh TEE kan) treats some types of cancer. It works by slowing down the growth of cancer cells. This medicine may be used for other purposes; ask your health care provider or pharmacist if you have questions. COMMON BRAND NAME(S): Camptosar What  should I tell my care team before I take this medication? They need to know if you have any of these conditions: Dehydration Diarrhea Infection, especially a viral infection, such as chickenpox, cold sores, herpes Liver disease Low blood cell levels (white cells, red cells, and platelets) Low levels of electrolytes, such as calcium, magnesium, or potassium in your blood Recent or ongoing radiation An unusual or allergic reaction to irinotecan, other medications, foods, dyes, or preservatives If you or your partner are pregnant or trying  to get pregnant Breast-feeding How should I use this medication? This medication is injected into a vein. It is given by your care team in a hospital or clinic setting. Talk to your care team about the use of this medication in children. Special care may be needed. Overdosage: If you think you have taken too much of this medicine contact a poison control center or emergency room at once. NOTE: This medicine is only for you. Do not share this medicine with others. What if I miss a dose? Keep appointments for follow-up doses. It is important not to miss your dose. Call your care team if you are unable to keep an appointment. What may interact with this medication? Do not take this medication with any of the following: Cobicistat Itraconazole This medication may also interact with the following: Certain antibiotics, such as clarithromycin, rifampin, rifabutin Certain antivirals for HIV or AIDS Certain medications for fungal infections, such as ketoconazole, posaconazole, voriconazole Certain medications for seizures, such as carbamazepine, phenobarbital, phenytoin Gemfibrozil Nefazodone St. John's wort This list may not describe all possible interactions. Give your health care provider a list of all the medicines, herbs, non-prescription drugs, or dietary supplements you use. Also tell them if you smoke, drink alcohol, or use illegal drugs. Some items may interact with your medicine. What should I watch for while using this medication? Your condition will be monitored carefully while you are receiving this medication. You may need blood work while taking this medication. This medication may make you feel generally unwell. This is not uncommon as chemotherapy can affect healthy cells as well as cancer cells. Report any side effects. Continue your course of treatment even though you feel ill unless your care team tells you to stop. This medication can cause serious side effects. To reduce the  risk, your care team may give you other medications to take before receiving this one. Be sure to follow the directions from your care team. This medication may affect your coordination, reaction time, or judgement. Do not drive or operate machinery until you know how this medication affects you. Sit up or stand slowly to reduce the risk of dizzy or fainting spells. Drinking alcohol with this medication can increase the risk of these side effects. This medication may increase your risk of getting an infection. Call your care team for advice if you get a fever, chills, sore throat, or other symptoms of a cold or flu. Do not treat yourself. Try to avoid being around people who are sick. Avoid taking medications that contain aspirin, acetaminophen, ibuprofen, naproxen, or ketoprofen unless instructed by your care team. These medications may hide a fever. This medication may increase your risk to bruise or bleed. Call your care team if you notice any unusual bleeding. Be careful brushing or flossing your teeth or using a toothpick because you may get an infection or bleed more easily. If you have any dental work done, tell your dentist you are receiving this medication. Talk to your  care team if you or your partner are pregnant or think either of you might be pregnant. This medication can cause serious birth defects if taken during pregnancy and for 6 months after the last dose. You will need a negative pregnancy test before starting this medication. Contraception is recommended while taking this medication and for 6 months after the last dose. Your care team can help you find the option that works for you. Do not father a child while taking this medication and for 3 months after the last dose. Use a condom for contraception during this time period. Do not breastfeed while taking this medication and for 7 days after the last dose. This medication may cause infertility. Talk to your care team if you are  concerned about your fertility. What side effects may I notice from receiving this medication? Side effects that you should report to your care team as soon as possible: Allergic reactions--skin rash, itching, hives, swelling of the face, lips, tongue, or throat Dry cough, shortness of breath or trouble breathing Increased saliva or tears, increased sweating, stomach cramping, diarrhea, small pupils, unusual weakness or fatigue, slow heartbeat Infection--fever, chills, cough, sore throat, wounds that don't heal, pain or trouble when passing urine, general feeling of discomfort or being unwell Kidney injury--decrease in the amount of urine, swelling of the ankles, hands, or feet Low red blood cell level--unusual weakness or fatigue, dizziness, headache, trouble breathing Severe or prolonged diarrhea Unusual bruising or bleeding Side effects that usually do not require medical attention (report to your care team if they continue or are bothersome): Constipation Diarrhea Hair loss Loss of appetite Nausea Stomach pain This list may not describe all possible side effects. Call your doctor for medical advice about side effects. You may report side effects to FDA at 1-800-FDA-1088. Where should I keep my medication? This medication is given in a hospital or clinic. It will not be stored at home. NOTE: This sheet is a summary. It may not cover all possible information. If you have questions about this medicine, talk to your doctor, pharmacist, or health care provider.  2024 Elsevier/Gold Standard (2021-10-27 00:00:00)  Leucovorin Injection What is this medication? LEUCOVORIN (loo koe VOR in) prevents side effects from certain medications, such as methotrexate. It works by increasing folate levels. This helps protect healthy cells in your body. It may also be used to treat anemia caused by low levels of folate. It can also be used with fluorouracil, a type of chemotherapy, to treat colorectal  cancer. It works by increasing the effects of fluorouracil in the body. This medicine may be used for other purposes; ask your health care provider or pharmacist if you have questions. What should I tell my care team before I take this medication? They need to know if you have any of these conditions: Anemia from low levels of vitamin B12 in the blood An unusual or allergic reaction to leucovorin, folic acid, other medications, foods, dyes, or preservatives Pregnant or trying to get pregnant Breastfeeding How should I use this medication? This medication is injected into a vein or a muscle. It is given by your care team in a hospital or clinic setting. Talk to your care team about the use of this medication in children. Special care may be needed. Overdosage: If you think you have taken too much of this medicine contact a poison control center or emergency room at once. NOTE: This medicine is only for you. Do not share this medicine with others. What  if I miss a dose? Keep appointments for follow-up doses. It is important not to miss your dose. Call your care team if you are unable to keep an appointment. What may interact with this medication? Capecitabine Fluorouracil Phenobarbital Phenytoin Primidone Trimethoprim;sulfamethoxazole This list may not describe all possible interactions. Give your health care provider a list of all the medicines, herbs, non-prescription drugs, or dietary supplements you use. Also tell them if you smoke, drink alcohol, or use illegal drugs. Some items may interact with your medicine. What should I watch for while using this medication? Your condition will be monitored carefully while you are receiving this medication. This medication may increase the side effects of 5-fluorouracil. Tell your care team if you have diarrhea or mouth sores that do not get better or that get worse. What side effects may I notice from receiving this medication? Side effects that  you should report to your care team as soon as possible: Allergic reactions--skin rash, itching, hives, swelling of the face, lips, tongue, or throat This list may not describe all possible side effects. Call your doctor for medical advice about side effects. You may report side effects to FDA at 1-800-FDA-1088. Where should I keep my medication? This medication is given in a hospital or clinic. It will not be stored at home. NOTE: This sheet is a summary. It may not cover all possible information. If you have questions about this medicine, talk to your doctor, pharmacist, or health care provider.  2024 Elsevier/Gold Standard (2021-11-18 00:00:00)  Fluorouracil Injection What is this medication? FLUOROURACIL (flure oh YOOR a sil) treats some types of cancer. It works by slowing down the growth of cancer cells. This medicine may be used for other purposes; ask your health care provider or pharmacist if you have questions. COMMON BRAND NAME(S): Adrucil What should I tell my care team before I take this medication? They need to know if you have any of these conditions: Blood disorders Dihydropyrimidine dehydrogenase (DPD) deficiency Infection, such as chickenpox, cold sores, herpes Kidney disease Liver disease Poor nutrition Recent or ongoing radiation therapy An unusual or allergic reaction to fluorouracil, other medications, foods, dyes, or preservatives If you or your partner are pregnant or trying to get pregnant Breast-feeding How should I use this medication? This medication is injected into a vein. It is administered by your care team in a hospital or clinic setting. Talk to your care team about the use of this medication in children. Special care may be needed. Overdosage: If you think you have taken too much of this medicine contact a poison control center or emergency room at once. NOTE: This medicine is only for you. Do not share this medicine with others. What if I miss a  dose? Keep appointments for follow-up doses. It is important not to miss your dose. Call your care team if you are unable to keep an appointment. What may interact with this medication? Do not take this medication with any of the following: Live virus vaccines This medication may also interact with the following: Medications that treat or prevent blood clots, such as warfarin, enoxaparin, dalteparin This list may not describe all possible interactions. Give your health care provider a list of all the medicines, herbs, non-prescription drugs, or dietary supplements you use. Also tell them if you smoke, drink alcohol, or use illegal drugs. Some items may interact with your medicine. What should I watch for while using this medication? Your condition will be monitored carefully while you are receiving  this medication. This medication may make you feel generally unwell. This is not uncommon as chemotherapy can affect healthy cells as well as cancer cells. Report any side effects. Continue your course of treatment even though you feel ill unless your care team tells you to stop. In some cases, you may be given additional medications to help with side effects. Follow all directions for their use. This medication may increase your risk of getting an infection. Call your care team for advice if you get a fever, chills, sore throat, or other symptoms of a cold or flu. Do not treat yourself. Try to avoid being around people who are sick. This medication may increase your risk to bruise or bleed. Call your care team if you notice any unusual bleeding. Be careful brushing or flossing your teeth or using a toothpick because you may get an infection or bleed more easily. If you have any dental work done, tell your dentist you are receiving this medication. Avoid taking medications that contain aspirin, acetaminophen, ibuprofen, naproxen, or ketoprofen unless instructed by your care team. These medications may hide  a fever. Do not treat diarrhea with over the counter products. Contact your care team if you have diarrhea that lasts more than 2 days or if it is severe and watery. This medication can make you more sensitive to the sun. Keep out of the sun. If you cannot avoid being in the sun, wear protective clothing and sunscreen. Do not use sun lamps, tanning beds, or tanning booths. Talk to your care team if you or your partner wish to become pregnant or think you might be pregnant. This medication can cause serious birth defects if taken during pregnancy and for 3 months after the last dose. A reliable form of contraception is recommended while taking this medication and for 3 months after the last dose. Talk to your care team about effective forms of contraception. Do not father a child while taking this medication and for 3 months after the last dose. Use a condom while having sex during this time period. Do not breastfeed while taking this medication. This medication may cause infertility. Talk to your care team if you are concerned about your fertility. What side effects may I notice from receiving this medication? Side effects that you should report to your care team as soon as possible: Allergic reactions--skin rash, itching, hives, swelling of the face, lips, tongue, or throat Heart attack--pain or tightness in the chest, shoulders, arms, or jaw, nausea, shortness of breath, cold or clammy skin, feeling faint or lightheaded Heart failure--shortness of breath, swelling of the ankles, feet, or hands, sudden weight gain, unusual weakness or fatigue Heart rhythm changes--fast or irregular heartbeat, dizziness, feeling faint or lightheaded, chest pain, trouble breathing High ammonia level--unusual weakness or fatigue, confusion, loss of appetite, nausea, vomiting, seizures Infection--fever, chills, cough, sore throat, wounds that don't heal, pain or trouble when passing urine, general feeling of discomfort or  being unwell Low red blood cell level--unusual weakness or fatigue, dizziness, headache, trouble breathing Pain, tingling, or numbness in the hands or feet, muscle weakness, change in vision, confusion or trouble speaking, loss of balance or coordination, trouble walking, seizures Redness, swelling, and blistering of the skin over hands and feet Severe or prolonged diarrhea Unusual bruising or bleeding Side effects that usually do not require medical attention (report to your care team if they continue or are bothersome): Dry skin Headache Increased tears Nausea Pain, redness, or swelling with sores inside the mouth  or throat Sensitivity to light Vomiting This list may not describe all possible side effects. Call your doctor for medical advice about side effects. You may report side effects to FDA at 1-800-FDA-1088. Where should I keep my medication? This medication is given in a hospital or clinic. It will not be stored at home. NOTE: This sheet is a summary. It may not cover all possible information. If you have questions about this medicine, talk to your doctor, pharmacist, or health care provider.  2024 Elsevier/Gold Standard (2021-10-21 00:00:00)   To help prevent nausea and vomiting after your treatment, we encourage you to take your nausea medication as directed.  BELOW ARE SYMPTOMS THAT SHOULD BE REPORTED IMMEDIATELY: *FEVER GREATER THAN 100.4 F (38 C) OR HIGHER *CHILLS OR SWEATING *NAUSEA AND VOMITING THAT IS NOT CONTROLLED WITH YOUR NAUSEA MEDICATION *UNUSUAL SHORTNESS OF BREATH *UNUSUAL BRUISING OR BLEEDING *URINARY PROBLEMS (pain or burning when urinating, or frequent urination) *BOWEL PROBLEMS (unusual diarrhea, constipation, pain near the anus) TENDERNESS IN MOUTH AND THROAT WITH OR WITHOUT PRESENCE OF ULCERS (sore throat, sores in mouth, or a toothache) UNUSUAL RASH, SWELLING OR PAIN  UNUSUAL VAGINAL DISCHARGE OR ITCHING   Items with * indicate a potential emergency  and should be followed up as soon as possible or go to the Emergency Department if any problems should occur.  Please show the CHEMOTHERAPY ALERT CARD or IMMUNOTHERAPY ALERT CARD at check-in to the Emergency Department and triage nurse.  Should you have questions after your visit or need to cancel or reschedule your appointment, please contact [facility] [place] - A DEPT OF [facility] [phone]  and follow the prompts.  Office hours are 8:00 a.m. to 4:30 p.m. Monday - Friday. Please note that voicemails left after 4:00 p.m. may not be returned until the following business day.  We are closed weekends and major holidays. You have access to a nurse at all times for urgent questions. Please call the main number to the clinic [phone] and follow the prompts.  For any non-urgent questions, you may also contact your provider using MyChart. We now offer e-Visits for anyone 51 and older to request care online for non-urgent symptoms. For details visit [email or web address].   Also download the MyChart app! Go to the app store, search "MyChart", open the app, select [place], and log in with your MyChart username and password.

## 2024-03-01 NOTE — Progress Notes (Signed)
 Patient presents today for chemotherapy/immunotherapy infusion of MVASI , irinotecan , Leucovorin , and Adrucil  .  Patient is in satisfactory condition with no new complaints voiced.  Vital signs are stable.  Labs reviewed and all labs are within treatment parameters with exception of platelets 93. Dr Davonna aware, order given by Dr Davonna to continue with treatment. Potassium 3.4, 40mEq of PO potassium to be given per order. We will proceed with treatment per MD orders.   Patient tolerated treatment well with no complaints voiced.  Patient left ambulatory in stable condition.  Vital signs stable at discharge.  Follow up as scheduled.

## 2024-03-02 LAB — CEA: CEA: 12.8 ng/mL — ABNORMAL HIGH (ref 0.0–4.7)

## 2024-03-03 ENCOUNTER — Inpatient Hospital Stay

## 2024-03-03 VITALS — BP 138/73 | HR 80 | Temp 97.9°F | Resp 18

## 2024-03-03 DIAGNOSIS — C189 Malignant neoplasm of colon, unspecified: Secondary | ICD-10-CM

## 2024-03-03 DIAGNOSIS — Z5111 Encounter for antineoplastic chemotherapy: Secondary | ICD-10-CM | POA: Diagnosis not present

## 2024-03-03 MED ORDER — PEGFILGRASTIM-JMDB 6 MG/0.6ML ~~LOC~~ SOSY
6.0000 mg | PREFILLED_SYRINGE | Freq: Once | SUBCUTANEOUS | Status: AC
  Administered 2024-03-03: 6 mg via SUBCUTANEOUS
  Filled 2024-03-03: qty 0.6

## 2024-03-03 NOTE — Patient Instructions (Signed)
 [facility] [place] - A DEPT OF [facility]  Discharge Instructions: Thank you for choosing [facility] to provide your oncology and hematology care.  If you have a lab appointment with the Cancer Center - please note that after April 8th, 2024, all labs will be drawn in the cancer center.  You do not have to check in or register with the main entrance as you have in the past but will complete your check-in in the cancer center.  Wear comfortable clothing and clothing appropriate for easy access to any Portacath or PICC line.   We strive to give you quality time with your provider. You may need to reschedule your appointment if you arrive late (15 or more minutes).  Arriving late affects you and other patients whose appointments are after yours.  Also, if you miss three or more appointments without notifying the office, you may be dismissed from the clinic at the provider's discretion.      For prescription refill requests, have your pharmacy contact our office and allow 72 hours for refills to be completed.    Today you received the following pump stop and Fulphila  injection.  Pegfilgrastim  Injection What is this medication? PEGFILGRASTIM  (PEG fil gra stim) lowers the risk of infection in people who are receiving chemotherapy. It works by Systems analyst make more white blood cells, which protects your body from infection. It may also be used to help people who have been exposed to high doses of radiation. This medicine may be used for other purposes; ask your health care provider or pharmacist if you have questions. COMMON BRAND NAME(S): Fulphila , Fylnetra , Neulasta , Nyvepria , Stimufend , UDENYCA , UDENYCA  ONBODY, Ziextenzo  What should I tell my care team before I take this medication? They need to know if you have any of these conditions: Kidney disease Latex allergy Ongoing radiation therapy Sickle cell disease Skin reactions to acrylic adhesives  (On-Body Injector only) An unusual or allergic reaction to pegfilgrastim , filgrastim, other medications, foods, dyes, or preservatives Pregnant or trying to get pregnant Breast-feeding How should I use this medication? This medication is for injection under the skin. If you get this medication at home, you will be taught how to prepare and give the pre-filled syringe or how to use the On-body Injector. Refer to the patient Instructions for Use for detailed instructions. Use exactly as directed. Tell your care team immediately if you suspect that the On-body Injector may not have performed as intended or if you suspect the use of the On-body Injector resulted in a missed or partial dose. It is important that you put your used needles and syringes in a special sharps container. Do not put them in a trash can. If you do not have a sharps container, call your pharmacist or care team to get one. Talk to your care team about the use of this medication in children. While this medication may be prescribed for selected conditions, precautions do apply. Overdosage: If you think you have taken too much of this medicine contact a poison control center or emergency room at once. NOTE: This medicine is only for you. Do not share this medicine with others. What if I miss a dose? It is important not to miss your dose. Call your care team if you miss your dose. If you miss a dose due to an On-body Injector failure or leakage, a new dose should be administered as soon as possible using a single prefilled syringe for  manual use. What may interact with this medication? Interactions have not been studied. This list may not describe all possible interactions. Give your health care provider a list of all the medicines, herbs, non-prescription drugs, or dietary supplements you use. Also tell them if you smoke, drink alcohol, or use illegal drugs. Some items may interact with your medicine. What should I watch for while using  this medication? Your condition will be monitored carefully while you are receiving this medication. You may need blood work done while you are taking this medication. Talk to your care team about your risk of cancer. You may be more at risk for certain types of cancer if you take this medication. If you are going to need a MRI, CT scan, or other procedure, tell your care team that you are using this medication (On-Body Injector only). What side effects may I notice from receiving this medication? Side effects that you should report to your care team as soon as possible: Allergic reactions--skin rash, itching, hives, swelling of the face, lips, tongue, or throat Capillary leak syndrome--stomach or muscle pain, unusual weakness or fatigue, feeling faint or lightheaded, decrease in the amount of urine, swelling of the ankles, hands, or feet, trouble breathing High white blood cell level--fever, fatigue, trouble breathing, night sweats, change in vision, weight loss Inflammation of the aorta--fever, fatigue, back, chest, or stomach pain, severe headache Kidney injury (glomerulonephritis)--decrease in the amount of urine, red or dark brown urine, foamy or bubbly urine, swelling of the ankles, hands, or feet Shortness of breath or trouble breathing Spleen injury--pain in upper left stomach or shoulder Unusual bruising or bleeding Side effects that usually do not require medical attention (report to your care team if they continue or are bothersome): Bone pain Pain in the hands or feet This list may not describe all possible side effects. Call your doctor for medical advice about side effects. You may report side effects to FDA at 1-800-FDA-1088. Where should I keep my medication? Keep out of the reach of children. If you are using this medication at home, you will be instructed on how to store it. Throw away any unused medication after the expiration date on the label. NOTE: This sheet is a summary.  It may not cover all possible information. If you have questions about this medicine, talk to your doctor, pharmacist, or health care provider.  2024 Elsevier/Gold Standard (2021-05-16 00:00:00)     To help prevent nausea and vomiting after your treatment, we encourage you to take your nausea medication as directed.  BELOW ARE SYMPTOMS THAT SHOULD BE REPORTED IMMEDIATELY: *FEVER GREATER THAN 100.4 F (38 C) OR HIGHER *CHILLS OR SWEATING *NAUSEA AND VOMITING THAT IS NOT CONTROLLED WITH YOUR NAUSEA MEDICATION *UNUSUAL SHORTNESS OF BREATH *UNUSUAL BRUISING OR BLEEDING *URINARY PROBLEMS (pain or burning when urinating, or frequent urination) *BOWEL PROBLEMS (unusual diarrhea, constipation, pain near the anus) TENDERNESS IN MOUTH AND THROAT WITH OR WITHOUT PRESENCE OF ULCERS (sore throat, sores in mouth, or a toothache) UNUSUAL RASH, SWELLING OR PAIN  UNUSUAL VAGINAL DISCHARGE OR ITCHING   Items with * indicate a potential emergency and should be followed up as soon as possible or go to the Emergency Department if any problems should occur.  Please show the CHEMOTHERAPY ALERT CARD or IMMUNOTHERAPY ALERT CARD at check-in to the Emergency Department and triage nurse.  Should you have questions after your visit or need to cancel or reschedule your appointment, please contact [facility] [place] - A DEPT  OF [facility] [phone]  and follow the prompts.  Office hours are 8:00 a.m. to 4:30 p.m. Monday - Friday. Please note that voicemails left after 4:00 p.m. may not be returned until the following business day.  We are closed weekends and major holidays. You have access to a nurse at all times for urgent questions. Please call the main number to the clinic [phone] and follow the prompts.  For any non-urgent questions, you may also contact your provider using MyChart. We now offer e-Visits for anyone 38 and older to request care online for non-urgent symptoms. For  details visit [email or web address].   Also download the MyChart app! Go to the app store, search MyChart, open the app, select [place], and log in with your MyChart username and password.

## 2024-03-03 NOTE — Progress Notes (Signed)
 Patient presents today for Pump stop and Fulphila  inj4ection. Pump removed without complication. Patients port flushed without difficulty.  Good blood return noted with no bruising or swelling noted at site.  Band aid applied.    Patient tolerated injection in right arm well with no complaints voiced.  Site clean and dry with no bruising or swelling noted.  No complaints of pain.  Discharged with vital signs stable and no signs or symptoms of distress noted.

## 2024-03-06 ENCOUNTER — Telehealth: Payer: Self-pay | Admitting: *Deleted

## 2024-03-06 NOTE — Telephone Encounter (Signed)
 Spoke to patient regarding ostomy supplies ordered from [employer] on 02/29/24.  Called to check status of shipment.  No tracking number available at this time.  Patient given contact number of [phone] to keep an eye on tracking and future ordering.  Will reach out if she has difficulty obtaining supplies in the future.

## 2024-03-07 ENCOUNTER — Ambulatory Visit
Admission: RE | Admit: 2024-03-07 | Discharge: 2024-03-07 | Disposition: A | Discharge status: Home / Self Care | Source: Ambulatory Visit | Attending: Hematology | Admitting: Hematology

## 2024-03-07 DIAGNOSIS — C189 Malignant neoplasm of colon, unspecified: Secondary | ICD-10-CM | POA: Diagnosis not present

## 2024-03-07 DIAGNOSIS — C787 Secondary malignant neoplasm of liver and intrahepatic bile duct: Secondary | ICD-10-CM | POA: Insufficient documentation

## 2024-03-07 MED ORDER — IOHEXOL 300 MG/ML  SOLN
85.0000 mL | Freq: Once | INTRAMUSCULAR | Status: AC | PRN
  Administered 2024-03-07: 85 mL via INTRAVENOUS

## 2024-03-07 MED ORDER — BARIUM SULFATE 2 % PO SUSP
450.0000 mL | ORAL | Status: AC
  Administered 2024-03-07 (×2): 450 mL via ORAL

## 2024-03-08 ENCOUNTER — Ambulatory Visit
Admission: RE | Admit: 2024-03-08 | Discharge: 2024-03-08 | Disposition: A | Discharge status: Home / Self Care | Source: Ambulatory Visit | Attending: Radiation Oncology | Admitting: Radiation Oncology

## 2024-03-08 ENCOUNTER — Encounter: Payer: Self-pay | Admitting: Radiation Oncology

## 2024-03-08 ENCOUNTER — Other Ambulatory Visit: Payer: Self-pay | Admitting: Radiation Oncology

## 2024-03-08 VITALS — BP 135/87 | HR 76 | Temp 97.0°F | Resp 16 | Ht 63.0 in | Wt 157.2 lb

## 2024-03-08 DIAGNOSIS — R918 Other nonspecific abnormal finding of lung field: Secondary | ICD-10-CM | POA: Diagnosis not present

## 2024-03-08 NOTE — Progress Notes (Signed)
 Radiation Oncology Follow up Note  Name: Susan Martin   Date:   03/08/2024 MRN:  [id number] DOB: 25-Jun-1961    This 63 y.o. 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  Seen today in routine follow-up she is doing fairly well.  Quite nervous.  She specifically denies any change in her pulmonary status cough hemoptysis or chest tightness.  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 Well-developed well-nourished patient in NAD. HEENT reveals PERLA, EOMI, discs not visualized.  Oral cavity is clear. No oral mucosal lesions are identified. Neck is clear without evidence of cervical or supraclavicular adenopathy. Lungs are clear to A&P. Cardiac examination is essentially unremarkable with regular rate and rhythm without murmur rub or thrill. Abdomen is benign with no organomegaly or masses noted. Motor sensory and DTR levels are equal and symmetric in the upper and lower extremities. Cranial nerves  II through XII are grossly intact. Proprioception is intact. No peripheral adenopathy or edema is identified. No motor or sensory levels are noted. Crude visual fields are within normal range.  RADIOLOGY RESULTS: CT scan reviewed compatible with above-stated findings  PLAN: At this time patient continues treatment in [place] with chemoimmunotherapy.  I have asked to see her back in 4 months with a repeat CT scan of her chest at that time.  I be happy to reevaluate the patient for any further progression of disease.  Patient knows to call with any concerns.  I would like to take this opportunity to thank you for allowing me to participate in the care of your patient.SABRA Marcey Penton, MD

## 2024-03-09 ENCOUNTER — Other Ambulatory Visit: Payer: Self-pay

## 2024-03-14 NOTE — Progress Notes (Signed)
 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 female was transferred to my care after her prior physician has left.   ASSESSMENT & PLAN:   Assessment & Plan: Susan Martin  is a 63 y.o. 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 Patient had SBRT done to lung lesions and finished it in 01/2024  - We reviewed the recent CT scan results in detail.  Patient has 1 lung lesion that has been increasing in size. -Recommended patient to consider radiation to that lesion.  Will reach out to the radiation oncologist in [place] and send records over there.  Patient states that she will also contact her radiation oncologist. - Labs reviewed today: CMP: Normal creatinine and stable LFTs.  CBC: Hemoglobin: 10.3, WBC normal and platelets of 85.  UA with no proteinuria -Physical exam stable.  Proceed with chemotherapy today - Will repeat a CT scan in 3 months - Will obtain CEA with every other cycle  Return to clinic in 1 month with labs for follow-up Normocytic anemia Likely secondary to chemotherapy. Anemia panel today within normal limits  - Continue to monitor Peripheral neuropathy due to chemotherapy (HCC) Numbness and tingling in hands improving post-oxaliplatin   discontinuation, managed with gabapentin . Diarrhea, unspecified type Occasional diarrhea managed with Imodium, attributed to chemotherapy.    The patient understands the plans discussed today and is in agreement with them.  She knows to contact our office if she develops concerns prior to her next appointment.  60 minutes of total time was spent for this patient encounter, including preparation,review of records,  face-to-face counseling with the patient and coordination of care, physical exam, and documentation of the encounter.    Susan Martin,acting as a Neurosurgeon for Mickiel Dry, MD.,have documented all relevant documentation on the behalf of Mickiel Dry, MD,as directed by  Mickiel Dry, MD while in the presence of Mickiel Dry, MD.  I, Mickiel Dry MD, have reviewed the above documentation for accuracy and completeness, and I agree with the above.    Mickiel Dry, MD  [facility] [place] - A DEPT OF [facility] [address] [place] [address] Dept: [phone] Dept Fax: [phone]   Orders Placed This Encounter  Procedures   Ferritin    Standing Status:   Future    Number of Occurrences:   1    Expected Date:   03/15/2024    Expiration Date:   06/13/2024   Folate    Standing Status:   Future    Number of Occurrences:   1    Expected Date:   03/15/2024    Expiration Date:   06/13/2024   Vitamin B12    Standing Status:   Future    Number of Occurrences:   1    Expected Date:   03/15/2024    Expiration  Date:   06/13/2024   Iron and TIBC    Standing Status:   Future    Number of Occurrences:   1    Expected Date:   03/15/2024    Expiration Date:   06/13/2024   CEA    Standing Status:   Future    Expected Date:   03/29/2024    Expiration Date:   03/29/2025   Magnesium    Standing Status:   Future    Expected Date:   03/29/2024    Expiration Date:   03/29/2025   CBC with Differential     Standing Status:   Future    Expected Date:   03/29/2024    Expiration Date:   03/29/2025   Comprehensive metabolic panel    Standing Status:   Future    Expected Date:   03/29/2024    Expiration Date:   03/29/2025   Urinalysis, dipstick only    Standing Status:   Future    Expected Date:   03/29/2024    Expiration Date:   03/29/2025     ONCOLOGY HISTORY:   I have reviewed her chart and materials related to her cancer extensively and collaborated history with the patient. 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 There is pharyngeal wall thickening within the mid sigmoid colon with surrounding extraluminal gas and fluid collections compatible with abscess. Free air is also noted in the upper abdomen adjacent to the liver. Numerous low-density masses throughout the liver. Appearance is concerning for metastases. -04/29/2020: Sigmoid colectomy and end colostomy Pathology: Adenocarcinoma, moderately differentiated, 3 cm  -  Lymphovascular space and perineural invasion present  -  No carcinoma identified in eight lymph nodes (0/8) .  - MMR preserved. MSI-stable. - pT4a, pN1c  -05/02/2020: CEA : 60.2 -05/02/2020: CT chest: No definite evidence of metastatic disease to the chest with tiny 2 mm RIGHT middle lobe pulmonary nodule.  Numerous low-attenuation lesions in the liver are more compatible with hepatic metastatic disease rather than hepatic abscess. -05/03/2020: Liver biopsy.   Pathology: Adenocarcinoma- Morphology compatible with colorectal carcinoma -05/07/2020: CT AP: Post distal colonic resection with sigmoid colostomy and Hartmann pouch. Previously identified distal sigmoid tumor no longer seen. Suspected hepatic metastases.  -06/04/2020-11/06/2020: FOLFOX and bevacizumab   -05/03/2020: [facility] testing: MSI-stable, KRAS/NRAS wild-type, TMB: low 44mut/Mb,  ARId1A, MAP2K2,TP53,APC: positive -11/20/2020-02/17/2023: 5FU with bevacizumab , discontinued due to progression -02/25/2023: CT CAP: Increased size and conspicuity of the hepatic metastasis. New scattered bilateral pulmonary nodules, concerning for pulmonary metastasis. Increased nodular mesenteric and omental stranding, concerning for peritoneal carcinomatosis.  -03/10/2023-05/24/2023: FOLFOX + bevacizumab  (oxaliplatin  readded), discontinued due to progression -06/02/2023: CT CAP: Numerous bilateral pulmonary nodules, new and enlarged compared to prior examination, consistent with worsened pulmonary metastatic disease. Several very ill-defined liver metastases, some of which are not appreciably changed, others of which appear to have slightly enlarged.  -06/09/2023-current: FOLFIRI and bevacizumab  -01/10/2024-02/17/2024: SBRT to lung lesions [facility]) -03/01/2024: CEA:12.8 -03/07/2024: CT CAP:  Innumerable bilateral pulmonary nodules some of which have increased in size and others which are stable. Bilobar hepatic lesions are overall stable from prior examination. New small volume free fluid in the left hemiabdomen/pelvis with similar peritoneal thickening and peritoneal/omental stranding.   Current Treatment:  FOLFIRI and bevacizumab   INTERVAL HISTORY:   Susan Martin is here today for follow up and to establish care with me for metastatic colon adenocarcinoma.  She experiences numbness and tingling primarily in her hands, which is improving. She continues to take gabapentin  to help manage them.  She reports occasional diarrhea, which she sometimes treats with Imodium. Her appetite is good, although she has experienced some weight loss due to dental issues requiring dentures. She mentions that she is starting to regain the lost weight.  I have reviewed the past medical history, past surgical history, social history and family history with the patient and they are unchanged from previous  note.  ALLERGIES:  has no known allergies.  MEDICATIONS:  Current Outpatient Medications  Medication Sig Dispense Refill   amLODipine  (NORVASC ) 10 MG tablet TAKE 1 TABLET BY MOUTH EVERY DAY 90 tablet 2   amLODipine  (NORVASC ) 5 MG tablet TAKE 1 TABLET (5 MG TOTAL) BY MOUTH DAILY. 90 tablet 1   BEVACIZUMAB  IV Inject 5 mg/kg into the vein every 14 (fourteen) days.     blood glucose meter kit and supplies KIT Dispense based on patient and insurance preference. Use up to four times daily as directed. 1 each 6   clonazePAM  (KLONOPIN ) 1 MG tablet TAKE 1 TABLET (1 MG TOTAL) BY MOUTH IN THE MORNING, AT NOON, AND AT BEDTIME. 90 tablet 3   dexamethasone  (DECADRON ) 2 MG tablet Take 1 tablet (2 mg total) by mouth as directed for 20 doses. 20 tablet 0   fluorouracil  CALGB 19297 in sodium chloride  0.9 % 150 mL Inject 2,400 mg/m2 into the vein over 48 hr.     gabapentin  (NEURONTIN ) 100 MG capsule TAKE 1 CAPSULE (100 MG TOTAL) BY MOUTH 3 TIMES A DAY 90 capsule 3   glimepiride  (AMARYL ) 1 MG tablet TAKE 1 TABLET BY MOUTH EVERY DAY WITH BREAKFAST 90 tablet 1   Lancets (ONETOUCH DELICA PLUS LANCET33G) MISC Apply topically.     lansoprazole  (PREVACID ) 30 MG capsule Take 1 capsule (30 mg total) by mouth daily at 12 noon. 30 capsule 0   LEUCOVORIN  CALCIUM  IV Inject 400 mg/m2 into the vein every 21 ( twenty-one) days.     lidocaine  (XYLOCAINE ) 2 % solution Use as directed 15 mLs in the mouth or throat every 6 (six) hours as needed for mouth pain. Swish and spit/swallow every six hours as needed for mouth pain 480 mL 3   lidocaine -prilocaine  (EMLA ) cream Apply 1 Application topically as needed. 30 g 0   Misc. Devices MISC Please provide with New Image Cera Plus 2 3/4 (2 - piece Ostomy skin barrier flange Ref # [id number]) & New Image  2 3/4 (2 - piece drainable Ostomy pouches Ref # [id number]) 5 Box 12   nystatin (MYCOSTATIN) 100000 UNIT/ML suspension Take by mouth.     ONETOUCH ULTRA test strip Use as instructed 100 each 6    OXALIPLATIN  IV Inject into the vein every 14 (fourteen) days.     oxyCODONE  (OXY IR/ROXICODONE ) 5 MG immediate release tablet Take 5 mg by mouth every 6 (six) hours as needed. for pain     PARoxetine  (PAXIL ) 40 MG tablet Take 1 tablet (40 mg total) by mouth every morning. 90 tablet 3   polyethylene glycol (MIRALAX  / GLYCOLAX ) packet Take 17 g by mouth every other day.     potassium chloride  (KLOR-CON  M10) 10 MEQ tablet Take 2 tablets (20 mEq total) by mouth daily. 180 tablet 3   sucralfate  (CARAFATE ) 1 g tablet Take 1 tablet (1 g total) by mouth 2 (two) times daily. Dissolve tablet in 4 T of warm water  swish and swallow morning and  evening. 60 tablet 1   triamterene -hydrochlorothiazide  (MAXZIDE -25) 37.5-25 MG tablet TAKE 1 TABLET BY MOUTH EVERY DAY 90 tablet 1   No current facility-administered medications for this visit.   Facility-Administered Medications Ordered in Other Visits  Medication Dose Route Frequency Provider Last Rate Last Admin   0.9 %  sodium chloride  infusion   Intravenous Continuous Aris Moman, MD 10 mL/hr at 03/15/24 0944 New Bag at 03/15/24 0944   atropine  injection 0.5 mg  0.5 mg Intravenous Once Yann Biehn, MD       bevacizumab -awwb (MVASI ) 350 mg in sodium chloride  0.9 % 100 mL chemo infusion  350 mg Intravenous Once Avier Jech, MD       fluorouracil  (ADRUCIL ) 3,500 mg in sodium chloride  0.9 % 80 mL chemo infusion  1,920 mg/m2 (Order-Specific) Intravenous 1 day or 1 dose Kamee Bobst, MD       fluorouracil  (ADRUCIL ) chemo injection 600 mg  320 mg/m2 (Order-Specific) Intravenous Once Skyley Grandmaison, MD       irinotecan  (CAMPTOSAR ) 260 mg in sodium chloride  0.9 % 500 mL chemo infusion  144 mg/m2 (Order-Specific) Intravenous Once Davonna Siad, MD       leucovorin  608 mg in sodium chloride  0.9 % 250 mL infusion  320 mg/m2 (Order-Specific) Intravenous Once Hadden Steig, MD       potassium chloride  SA (KLOR-CON  M) CR tablet 40 mEq  40 mEq Oral Once  Jennings Corado, MD        REVIEW OF SYSTEMS:   Constitutional: Denies fevers, chills or abnormal weight loss Eyes: Denies blurriness of vision Ears, nose, mouth, throat, and face: Denies mucositis or sore throat Respiratory: Denies cough, dyspnea or wheezes Cardiovascular: Denies palpitation, chest discomfort or lower extremity swelling Gastrointestinal:  Denies nausea, heartburn or change in bowel habits Skin: Denies abnormal skin rashes Lymphatics: Denies new lymphadenopathy or easy bruising Neurological:Denies numbness, tingling or new weaknesses Behavioral/Psych: Mood is stable, no new changes  All other systems were reviewed with the patient and are negative.   VITALS:  There were no vitals taken for this visit.  Wt Readings from Last 3 Encounters:  03/08/24 157 lb 3.2 oz (71.3 kg)  03/01/24 159 lb 2.8 oz (72.2 kg)  02/16/24 159 lb 2.8 oz (72.2 kg)    There is no height or weight on file to calculate BMI.  Performance status (ECOG): 1 - Symptomatic but completely ambulatory  PHYSICAL EXAM:   GENERAL:alert, no distress and comfortable SKIN: skin color, texture, turgor are normal, no rashes or significant lesions LYMPH:  no palpable lymphadenopathy in the cervical, axillary or inguinal LUNGS: clear to auscultation and percussion with normal breathing effort HEART: regular rate & rhythm and no murmurs and no lower extremity edema ABDOMEN:abdomen soft, non-tender and normal bowel sounds, colostomy bag in place Musculoskeletal:no cyanosis of digits and no clubbing  NEURO: alert & oriented x 3 with fluent speech, no focal motor/sensory deficits  LABORATORY DATA:  I have reviewed the data as listed   Lab Results  Component Value Date   WBC 6.6 03/15/2024   NEUTROABS 4.6 03/15/2024   HGB 10.3 (L) 03/15/2024   HCT 32.0 (L) 03/15/2024   MCV 95.5 03/15/2024   PLT 85 (L) 03/15/2024      Chemistry      Component Value Date/Time   NA 137 03/15/2024 0813   NA 140  03/05/2016 1201   K 3.4 (L) 03/15/2024 0813   CL 101 03/15/2024 0813   CO2 23 03/15/2024 0813   BUN 11  03/15/2024 0813   BUN 10 03/05/2016 1201   CREATININE 0.85 03/15/2024 0813      Component Value Date/Time   CALCIUM  8.8 (L) 03/15/2024 0813   ALKPHOS 185 (H) 03/15/2024 0813   AST 44 (H) 03/15/2024 0813   ALT 30 03/15/2024 0813   BILITOT 1.4 (H) 03/15/2024 0813   BILITOT 0.3 03/05/2016 1201       Latest Reference Range & Units 03/01/24 08:25  CEA 0.0 - 4.7 ng/mL 12.8 (H)  (H): Data is abnormally high  RADIOGRAPHIC STUDIES: I have personally reviewed the radiological images as listed and agreed with the findings in the report.  CT CHEST ABDOMEN PELVIS W CONTRAST CLINICAL DATA:  Metastatic disease evaluation restaging colon cancer history of partial colon resection and colostomy with liver metastasis. * Tracking Code: BO *  EXAM: CT CHEST, ABDOMEN, AND PELVIS WITH CONTRAST  TECHNIQUE: Multidetector CT imaging of the chest, abdomen and pelvis was performed following the standard protocol during bolus administration of intravenous contrast.  RADIATION DOSE REDUCTION: This exam was performed according to the departmental dose-optimization program which includes automated exposure control, adjustment of the mA and/or kV according to patient size and/or use of iterative reconstruction technique.  CONTRAST:  85mL OMNIPAQUE  IOHEXOL  300 MG/ML  SOLN  COMPARISON:  Multiple priors including CT December 10, 2023  FINDINGS: CT CHEST FINDINGS  Cardiovascular: Right chest Port-A-Cath with tip near the superior cavoatrial junction. Normal caliber thoracic aorta. Normal size heart. No significant pericardial effusion/thickening.  Mediastinum/Nodes: No suspicious thyroid nodule. No pathologically enlarged mediastinal, hilar or axillary lymph nodes. Retained versus reflux contrast in the esophagus.  Lungs/Pleura: Bilateral pulmonary nodules are again seen some of which have  increased in size others which are stable in size. For reference:  -Lobular right lower lobe pulmonary nodule measures 16 x 11 mm on image 97/3 previously 8 x 6 mm when remeasured for consistency.  -Nodule in the left upper lobe measures 6 mm on image 24/3, unchanged.  -pulmonary nodule in the superior segment of the right lower lobe measures 9 x 8 mm on image 59/3 previously 10 x 7 mm when remeasured for consistency  Musculoskeletal: No aggressive lytic or blastic lesion of bone.  CT ABDOMEN PELVIS FINDINGS  Hepatobiliary: Nodular hepatic contour may reflect cirrhosis or pseudo cirrhosis.  Bilobar hepatic lesions are overall stable from prior examination. Indexed lesion with internal calcifications in the left anterior lobe of the liver along the falciform ligament measures 12 x 12 mm on image 53/2 previously 13 x 12 mm.  Mild wall thickening of the gallbladder possibly reflect sequela of chronic hepatic disease. No biliary ductal dilation.  Pancreas: Pancreatic ductal dilation or evidence of acute inflammation.  Spleen: Splenomegaly measuring 15.7 cm in maximum craniocaudal dimension.  Adrenals/Urinary Tract: No suspicious adrenal nodule/mass. No hydronephrosis. Kidneys demonstrate symmetric enhancement. Urinary bladder is unremarkable for degree of distension.  Stomach/Bowel: Stomach is unremarkable for degree of distension. No pathologic dilation of small or large bowel. Prior partial left hemicolectomy with Hartmann's pouch formation and left anterior abdominal wall colostomy.  Vascular/Lymphatic: Normal caliber abdominal aorta. Smooth IVC contours. The portal, splenic and superior mesenteric veins are patent. No pathologically enlarged abdominal or pelvic lymph nodes.  Reproductive: Status post hysterectomy. No adnexal masses.  Other: New small volume free fluid in the left hemiabdomen/pelvis on image 94/2 similar peritoneal thickening and  peritoneal/omental stranding. No new discrete peritoneal or omental nodularity.  Musculoskeletal: No aggressive lytic or blastic lesion of bone.  IMPRESSION: 1. Innumerable  bilateral pulmonary nodules some of which have increased in size and others which are stable. 2. Bilobar hepatic lesions are overall stable from prior examination. 3. New small volume free fluid in the left hemiabdomen/pelvis with similar peritoneal thickening and peritoneal/omental stranding. No new discrete peritoneal or omental nodularity, findings which are concerning for peritoneal carcinomatosis. Suggest close attention on follow-up imaging. 4. Nodular hepatic contour may reflect cirrhosis or pseudocirrhosis. 5. Splenomegaly. 6. Prior partial left hemicolectomy with Hartmann's pouch formation and left anterior abdominal wall colostomy.  Electronically Signed   By: Reyes Holder M.D.   On: 03/13/2024 17:01

## 2024-03-15 ENCOUNTER — Encounter: Payer: Self-pay | Admitting: Oncology

## 2024-03-15 ENCOUNTER — Inpatient Hospital Stay

## 2024-03-15 ENCOUNTER — Inpatient Hospital Stay (HOSPITAL_BASED_OUTPATIENT_CLINIC_OR_DEPARTMENT_OTHER): Admitting: Oncology

## 2024-03-15 VITALS — BP 126/70 | HR 80 | Temp 97.8°F | Resp 18

## 2024-03-15 DIAGNOSIS — C189 Malignant neoplasm of colon, unspecified: Secondary | ICD-10-CM | POA: Diagnosis not present

## 2024-03-15 DIAGNOSIS — G620 Drug-induced polyneuropathy: Secondary | ICD-10-CM

## 2024-03-15 DIAGNOSIS — D649 Anemia, unspecified: Secondary | ICD-10-CM | POA: Diagnosis not present

## 2024-03-15 DIAGNOSIS — C787 Secondary malignant neoplasm of liver and intrahepatic bile duct: Secondary | ICD-10-CM | POA: Diagnosis not present

## 2024-03-15 DIAGNOSIS — R197 Diarrhea, unspecified: Secondary | ICD-10-CM | POA: Insufficient documentation

## 2024-03-15 DIAGNOSIS — Z5111 Encounter for antineoplastic chemotherapy: Secondary | ICD-10-CM | POA: Diagnosis not present

## 2024-03-15 DIAGNOSIS — T451X5A Adverse effect of antineoplastic and immunosuppressive drugs, initial encounter: Secondary | ICD-10-CM | POA: Diagnosis not present

## 2024-03-15 LAB — CBC WITH DIFFERENTIAL/PLATELET
Abs Immature Granulocytes: 0.04 K/uL (ref 0.00–0.07)
Basophils Absolute: 0 K/uL (ref 0.0–0.1)
Basophils Relative: 1 %
Eosinophils Absolute: 0.1 K/uL (ref 0.0–0.5)
Eosinophils Relative: 2 %
HCT: 32 % — ABNORMAL LOW (ref 36.0–46.0)
Hemoglobin: 10.3 g/dL — ABNORMAL LOW (ref 12.0–15.0)
Immature Granulocytes: 1 %
Lymphocytes Relative: 15 %
Lymphs Abs: 1 K/uL (ref 0.7–4.0)
MCH: 30.7 pg (ref 26.0–34.0)
MCHC: 32.2 g/dL (ref 30.0–36.0)
MCV: 95.5 fL (ref 80.0–100.0)
Monocytes Absolute: 0.8 K/uL (ref 0.1–1.0)
Monocytes Relative: 12 %
Neutro Abs: 4.6 K/uL (ref 1.7–7.7)
Neutrophils Relative %: 69 %
Platelets: 85 K/uL — ABNORMAL LOW (ref 150–400)
RBC: 3.35 MIL/uL — ABNORMAL LOW (ref 3.87–5.11)
RDW: 21.2 % — ABNORMAL HIGH (ref 11.5–15.5)
WBC: 6.6 K/uL (ref 4.0–10.5)
nRBC: 0 % (ref 0.0–0.2)

## 2024-03-15 LAB — COMPREHENSIVE METABOLIC PANEL WITH GFR
ALT: 30 U/L (ref 0–44)
AST: 44 U/L — ABNORMAL HIGH (ref 15–41)
Albumin: 3.3 g/dL — ABNORMAL LOW (ref 3.5–5.0)
Alkaline Phosphatase: 185 U/L — ABNORMAL HIGH (ref 38–126)
Anion gap: 13 (ref 5–15)
BUN: 11 mg/dL (ref 8–23)
CO2: 23 mmol/L (ref 22–32)
Calcium: 8.8 mg/dL — ABNORMAL LOW (ref 8.9–10.3)
Chloride: 101 mmol/L (ref 98–111)
Creatinine, Ser: 0.85 mg/dL (ref 0.44–1.00)
GFR, Estimated: >60 mL/min (ref >60)
Glucose, Bld: 149 mg/dL — ABNORMAL HIGH (ref 70–99)
Potassium: 3.4 mmol/L — ABNORMAL LOW (ref 3.5–5.1)
Sodium: 137 mmol/L (ref 135–145)
Total Bilirubin: 1.4 mg/dL — ABNORMAL HIGH (ref 0.0–1.2)
Total Protein: 6.4 g/dL — ABNORMAL LOW (ref 6.5–8.1)

## 2024-03-15 LAB — FERRITIN: Ferritin: 128 ng/mL (ref 11–307)

## 2024-03-15 LAB — FOLATE: Folate: >20 ng/mL (ref >5.9)

## 2024-03-15 LAB — IRON AND TIBC
Iron: 82 ug/dL (ref 28–170)
Saturation Ratios: 26 % (ref 10.4–31.8)
TIBC: 316 ug/dL (ref 250–450)
UIBC: 234 ug/dL

## 2024-03-15 LAB — VITAMIN B12: Vitamin B-12: 2064 pg/mL — ABNORMAL HIGH (ref 180–914)

## 2024-03-15 LAB — MAGNESIUM: Magnesium: 2 mg/dL (ref 1.7–2.4)

## 2024-03-15 LAB — URINALYSIS, DIPSTICK ONLY
Bilirubin Urine: NEGATIVE
Glucose, UA: NEGATIVE mg/dL
Hgb urine dipstick: NEGATIVE
Ketones, ur: NEGATIVE mg/dL
Leukocytes,Ua: NEGATIVE
Nitrite: NEGATIVE
Protein, ur: NEGATIVE mg/dL
Specific Gravity, Urine: 1.01 (ref 1.005–1.030)
pH: 6 (ref 5.0–8.0)

## 2024-03-15 MED ORDER — DEXAMETHASONE SODIUM PHOSPHATE 10 MG/ML IJ SOLN
10.0000 mg | Freq: Once | INTRAMUSCULAR | Status: AC
  Administered 2024-03-15: 10 mg via INTRAVENOUS
  Filled 2024-03-15: qty 1

## 2024-03-15 MED ORDER — POTASSIUM CHLORIDE CRYS ER 20 MEQ PO TBCR
40.0000 meq | EXTENDED_RELEASE_TABLET | Freq: Once | ORAL | Status: AC
  Administered 2024-03-15: 40 meq via ORAL
  Filled 2024-03-15: qty 2

## 2024-03-15 MED ORDER — SODIUM CHLORIDE 0.9 % IV SOLN
320.0000 mg/m2 | Freq: Once | INTRAVENOUS | Status: AC
  Administered 2024-03-15: 608 mg via INTRAVENOUS
  Filled 2024-03-15: qty 30.4

## 2024-03-15 MED ORDER — SODIUM CHLORIDE 0.9 % IV SOLN
350.0000 mg | Freq: Once | INTRAVENOUS | Status: AC
  Administered 2024-03-15: 350 mg via INTRAVENOUS
  Filled 2024-03-15: qty 14

## 2024-03-15 MED ORDER — PALONOSETRON HCL INJECTION 0.25 MG/5ML
0.2500 mg | Freq: Once | INTRAVENOUS | Status: AC
  Administered 2024-03-15: 0.25 mg via INTRAVENOUS
  Filled 2024-03-15: qty 5

## 2024-03-15 MED ORDER — SODIUM CHLORIDE 0.9 % IV SOLN: INTRAVENOUS | Status: DC

## 2024-03-15 MED ORDER — SODIUM CHLORIDE 0.9 % IV SOLN
144.0000 mg/m2 | Freq: Once | INTRAVENOUS | Status: AC
  Administered 2024-03-15: 260 mg via INTRAVENOUS
  Filled 2024-03-15: qty 13

## 2024-03-15 MED ORDER — ATROPINE SULFATE 1 MG/ML IV SOLN
0.5000 mg | Freq: Once | INTRAVENOUS | Status: AC
  Administered 2024-03-15: 0.5 mg via INTRAVENOUS
  Filled 2024-03-15: qty 1

## 2024-03-15 MED ORDER — SODIUM CHLORIDE 0.9 % IV SOLN
1920.0000 mg/m2 | INTRAVENOUS | Status: DC
  Administered 2024-03-15: 3500 mg via INTRAVENOUS
  Filled 2024-03-15: qty 70

## 2024-03-15 MED ORDER — FLUOROURACIL CHEMO INJECTION 2.5 GM/50ML
320.0000 mg/m2 | Freq: Once | INTRAVENOUS | Status: AC
  Administered 2024-03-15: 600 mg via INTRAVENOUS
  Filled 2024-03-15: qty 12

## 2024-03-15 NOTE — Patient Instructions (Signed)
 [facility] at [facility] Discharge Instructions   You were seen and examined today by Dr. Davonna.  She reviewed the results of your lab work which are normal/stable.   She reviewed the results of your CT scan which is mostly stable. There is one lung nodule that has grown in size. We will refer you back to Dr. Lenn to see if he can radiate this spot.   We will proceed with your treatment today.   Return as scheduled.    Thank you for choosing [facility] at [facility] to provide your oncology and hematology care.  To afford each patient quality time with our provider, please arrive at least 15 minutes before your scheduled appointment time.   If you have a lab appointment with the Cancer Center please come in thru the Main Entrance and check in at the main information desk.  You need to re-schedule your appointment should you arrive 10 or more minutes late.  We strive to give you quality time with our providers, and arriving late affects you and other patients whose appointments are after yours.  Also, if you no show three or more times for appointments you may be dismissed from the clinic at the providers discretion.     Again, thank you for choosing [facility].  Our hope is that these requests will decrease the amount of time that you wait before being seen by our physicians.       _____________________________________________________________  Should you have questions after your visit to [facility], please contact our office at [phone] and follow the prompts.  Our office hours are 8:00 a.m. and 4:30 p.m. Monday - Friday.  Please note that voicemails left after 4:00 p.m. may not be returned until the following business day.  We are closed weekends and major holidays.  You do have access to a nurse 24-7, just call the main number to the clinic [phone] and do not press any options, hold on the line and  a nurse will answer the phone.    For prescription refill requests, have your pharmacy contact our office and allow 72 hours.    Due to Covid, you will need to wear a mask upon entering the hospital. If you do not have a mask, a mask will be given to you at the Main Entrance upon arrival. For doctor visits, patients may have 1 support person age 64 or older with them. For treatment visits, patients can not have anyone with them due to social distancing guidelines and our immunocompromised population.

## 2024-03-15 NOTE — Assessment & Plan Note (Signed)
 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 Patient had SBRT done to lung lesions and finished it in 01/2024  - We reviewed the recent CT scan results in detail.  Patient has 1 lung lesion that has been increasing in size. -Recommended patient to consider radiation to that lesion.  Will reach out to the radiation oncologist in [place] and send records over there.  Patient states that she will also contact her radiation oncologist. - Labs reviewed today: CMP: Normal creatinine and stable LFTs.  CBC: Hemoglobin: 10.3, WBC normal and platelets of 85.  UA with no proteinuria -Physical exam stable.  Proceed with chemotherapy today - Will repeat a CT scan in 3 months - Will obtain CEA with every other cycle  Return to clinic in 1 month with labs for follow-up

## 2024-03-15 NOTE — Progress Notes (Signed)
 Decrease Bevacizumab  to 350mg  based on most recent weight per MD.  Bridgett Leotis Helling, RPH, BCPS, BCOP 03/15/2024 10:14 AM

## 2024-03-15 NOTE — Assessment & Plan Note (Addendum)
 Occasional diarrhea managed with Imodium, attributed to chemotherapy.

## 2024-03-15 NOTE — Progress Notes (Signed)
 Patient presents today for MVASI /Folfiri infusion. Patient is in satisfactory condition with no new complaints voiced.  Vital signs are stable.  Labs reviewed by Dr. Davonna during the office visit and all other labs are within treatment parameters. Patient's platelets noted to be 85. We will proceed with treatment per MD orders.   Treatment given today per MD orders. Tolerated infusion without adverse affects. Vital signs stable. No complaints at this time. Discharged from clinic ambulatory in stable condition. Alert and oriented x 3. F/U with [facility] as scheduled.  5FU ambulatory pump infusing

## 2024-03-15 NOTE — Patient Instructions (Signed)
 [facility] [place] - [facility] DE[facility]  Discharge Instructions: Thank you for choosing [facility] to provide your oncology and hematology care.  If you have a lab appointment with the Cancer Center - please note that after April 8th, 2024, all labs will be drawn in the cancer center.  You do not have to check in or register with the main entrance as you have in the past but will complete your check-in in the cancer center.  Wear comfortable clothing and clothing appropriate for easy access to any Portacath or PICC line.   We strive to give you quality time with your provider. You may need to reschedule your appointment if you arrive late (15 or more minutes).  Arriving late affects you and other patients whose appointments are after yours.  Also, if you miss three or more appointments without notifying the office, you may be dismissed from the clinic at the provider's discretion.      For prescription refill requests, have your pharmacy contact our office and allow 72 hours for refills to be completed.    Today you received the following chemotherapy and/or immunotherapy agents MVASI/Folfiri   To help prevent nausea and vomiting after your treatment, we encourage you to take your nausea medication as directed.   BELOW ARE SYMPTOMS THAT SHOULD BE REPORTED IMMEDIATELY: *FEVER GREATER THAN 100.4 F (38 C) OR HIGHER *CHILLS OR SWEATING *NAUSEA AND VOMITING THAT IS NOT CONTROLLED WITH YOUR NAUSEA MEDICATION *UNUSUAL SHORTNESS OF BREATH *UNUSUAL BRUISING OR BLEEDING *URINARY PROBLEMS (pain or burning when urinating, or frequent urination) *BOWEL PROBLEMS (unusual diarrhea, constipation, pain near the anus) TENDERNESS IN MOUTH AND THROAT WITH OR WITHOUT PRESENCE OF ULCERS (sore throat, sores in mouth, or a toothache) UNUSUAL RASH, SWELLING OR PAIN  UNUSUAL VAGINAL DISCHARGE OR ITCHING   Items with * indicate a potential emergency and should be followed  up as soon as possible or go to the Emergency Department if any problems should occur.  Please show the CHEMOTHERAPY ALERT CARD or IMMUNOTHERAPY ALERT CARD at check-in to the Emergency Department and triage nurse.  Should you have questions after your visit or need to cancel or reschedule your appointment, please contact [facility]ER [facility] [place] - A DEPT OF Eligha Bridegroom [facility] [phone]  and follow the prompts.  Office hours are 8:00 a.m. to 4:30 p.m. Monday - Friday. Please note that voicemails left after 4:00 p.m. may not be returned until the following business day.  We are closed weekends and major holidays. You have access to a nurse at all times for urgent questions. Please call the main number to the clinic [phone] and follow the prompts.  For any non-urgent questions, you may also contact your provider using MyChart. We now offer e-Visits for anyone 8 and older to request care online for non-urgent symptoms. For details visit [email or web address].   Also download the MyChart app! Go to the app store, search "MyChart", open the app, select [place], and log in with your MyChart username and password.

## 2024-03-15 NOTE — Assessment & Plan Note (Addendum)
 Numbness and tingling in hands improving post-oxaliplatin  discontinuation, managed with gabapentin .

## 2024-03-16 ENCOUNTER — Encounter: Payer: Self-pay | Admitting: Oncology

## 2024-03-16 LAB — CEA: CEA: 11.7 ng/mL — ABNORMAL HIGH (ref 0.0–4.7)

## 2024-03-17 ENCOUNTER — Inpatient Hospital Stay

## 2024-03-17 VITALS — BP 139/65 | HR 85 | Temp 97.9°F | Resp 19

## 2024-03-17 DIAGNOSIS — C189 Malignant neoplasm of colon, unspecified: Secondary | ICD-10-CM

## 2024-03-17 DIAGNOSIS — Z5111 Encounter for antineoplastic chemotherapy: Secondary | ICD-10-CM | POA: Diagnosis not present

## 2024-03-17 MED ORDER — PEGFILGRASTIM-JMDB 6 MG/0.6ML ~~LOC~~ SOSY
6.0000 mg | PREFILLED_SYRINGE | Freq: Once | SUBCUTANEOUS | Status: AC
  Administered 2024-03-17: 6 mg via SUBCUTANEOUS
  Filled 2024-03-17: qty 0.6

## 2024-03-17 NOTE — Progress Notes (Signed)
 Patient tolerated injection with no complaints voiced. Site clean and dry with no bruising or swelling noted at site. See MAR for details. Band aid applied.  Patient stable during and after injection.  Port flushed with good blood return noted. No bruising or swelling at site. Bandaid applied and patient discharged in satisfactory condition. VVS stable with no signs or symptoms of distressed noted.

## 2024-03-17 NOTE — Patient Instructions (Signed)
 [facility] [place] - A DEPT OF [facility]  Discharge Instructions: Thank you for choosing [facility] to provide your oncology and hematology care.  If you have a lab appointment with the Cancer Center - please note that after April 8th, 2024, all labs will be drawn in the cancer center.  You do not have to check in or register with the main entrance as you have in the past but will complete your check-in in the cancer center.  Wear comfortable clothing and clothing appropriate for easy access to any Portacath or PICC line.   We strive to give you quality time with your provider. You may need to reschedule your appointment if you arrive late (15 or more minutes).  Arriving late affects you and other patients whose appointments are after yours.  Also, if you miss three or more appointments without notifying the office, you may be dismissed from the clinic at the provider's discretion.      For prescription refill requests, have your pharmacy contact our office and allow 72 hours for refills to be completed.    Today you received the following chemotherapy and/or immunotherapy agents port flushed and pegfilgrastim  injection, return as scheduled.   To help prevent nausea and vomiting after your treatment, we encourage you to take your nausea medication as directed.  BELOW ARE SYMPTOMS THAT SHOULD BE REPORTED IMMEDIATELY: *FEVER GREATER THAN 100.4 F (38 C) OR HIGHER *CHILLS OR SWEATING *NAUSEA AND VOMITING THAT IS NOT CONTROLLED WITH YOUR NAUSEA MEDICATION *UNUSUAL SHORTNESS OF BREATH *UNUSUAL BRUISING OR BLEEDING *URINARY PROBLEMS (pain or burning when urinating, or frequent urination) *BOWEL PROBLEMS (unusual diarrhea, constipation, pain near the anus) TENDERNESS IN MOUTH AND THROAT WITH OR WITHOUT PRESENCE OF ULCERS (sore throat, sores in mouth, or a toothache) UNUSUAL RASH, SWELLING OR PAIN  UNUSUAL VAGINAL DISCHARGE OR ITCHING   Items with *  indicate a potential emergency and should be followed up as soon as possible or go to the Emergency Department if any problems should occur.  Please show the CHEMOTHERAPY ALERT CARD or IMMUNOTHERAPY ALERT CARD at check-in to the Emergency Department and triage nurse.  Should you have questions after your visit or need to cancel or reschedule your appointment, please contact [facility] [place] - A DEPT OF [facility] [phone]  and follow the prompts.  Office hours are 8:00 a.m. to 4:30 p.m. Monday - Friday. Please note that voicemails left after 4:00 p.m. may not be returned until the following business day.  We are closed weekends and major holidays. You have access to a nurse at all times for urgent questions. Please call the main number to the clinic [phone] and follow the prompts.  For any non-urgent questions, you may also contact your provider using MyChart. We now offer e-Visits for anyone 80 and older to request care online for non-urgent symptoms. For details visit [email or web address].   Also download the MyChart app! Go to the app store, search MyChart, open the app, select [place], and log in with your MyChart username and password.

## 2024-03-23 ENCOUNTER — Other Ambulatory Visit: Payer: Self-pay | Admitting: Oncology

## 2024-03-23 DIAGNOSIS — C189 Malignant neoplasm of colon, unspecified: Secondary | ICD-10-CM

## 2024-03-27 ENCOUNTER — Ambulatory Visit
Admission: RE | Admit: 2024-03-27 | Discharge: 2024-03-27 | Disposition: A | Source: Ambulatory Visit | Attending: Radiation Oncology | Admitting: Radiation Oncology

## 2024-03-27 ENCOUNTER — Encounter: Payer: Self-pay | Admitting: Radiation Oncology

## 2024-03-27 VITALS — BP 138/66 | HR 79 | Resp 18 | Ht 63.0 in

## 2024-03-27 DIAGNOSIS — R918 Other nonspecific abnormal finding of lung field: Secondary | ICD-10-CM | POA: Diagnosis not present

## 2024-03-27 DIAGNOSIS — C7801 Secondary malignant neoplasm of right lung: Secondary | ICD-10-CM | POA: Diagnosis not present

## 2024-03-27 DIAGNOSIS — C187 Malignant neoplasm of sigmoid colon: Secondary | ICD-10-CM | POA: Diagnosis not present

## 2024-03-27 NOTE — Progress Notes (Signed)
 Radiation Oncology Follow up Note old patient new area progressive right lower lobe lung lesion  Name: Susan Martin   Date:   03/27/2024 MRN:  [id number] DOB: Jun 18, 1961    This 64 y.o. female presents to the clinic today for evaluation of a.  Progressive right lower lobe lung lesion in patient previously treated to her right middle lobe as well as the right hilar region for stage IV metastatic colorectal cancer now with progressing right lower lobe lesion  REFERRING PROVIDER: Davonna Siad, MD  HPI: Patient is a 63 year old female well-known to department having received 2 to courses of SBRT treatment 1 to her right hilar region as well as a right middle lobe for metastatic stage IV colorectal cancer ER she also has known liver metastasis.  She has been followed by.  Dr. Davonna who on recent CT scan showed increasing right lower lobe pulmonary nodule now 16 x 11 mm previously 8 x 6 mm.  She is fairly asymptomatic from her prior treatment specifically [place] cough hemoptysis chest tightness or any change in her pulmonary status.  She is seen today for consideration of treatment to the right lower lobe lesion.  COMPLICATIONS OF TREATMENT: none  FOLLOW UP COMPLIANCE: keeps appointments   PHYSICAL EXAM:  BP 138/66   Pulse 79   Resp 18   Ht 5' 3 (1.6 m)   BMI 27.85 kg/m  Well-developed well-nourished patient in NAD. HEENT reveals PERLA, EOMI, discs not visualized.  Oral cavity is clear. No oral mucosal lesions are identified. Neck is clear without evidence of cervical or supraclavicular adenopathy. Lungs are clear to A&P. Cardiac examination is essentially unremarkable with regular rate and rhythm without murmur rub or thrill. Abdomen is benign with no organomegaly or masses noted. Motor sensory and DTR levels are equal and symmetric in the upper and lower extremities. Cranial nerves II through XII are grossly intact. Proprioception is intact. No peripheral adenopathy or edema is identified. No  motor or sensory levels are noted. Crude visual fields are within normal range.  RADIOLOGY RESULTS: Serial CT scans reviewed compatible with above-stated findings  PLAN: Present time I like to go ahead with SBRT treatment to her right lower lobe.  Will plan on delivering 50 Gray in 5 fractions.  Would use motion restriction in our simulation as well as for dimensional treatment planning.  Risks and benefits of treatment including extremely low side effect profile were reviewed with the patient.  She comprehends my recommendations well.  I have personally set up and ordered CT simulation for later this week.  I would like to take this opportunity to thank you for allowing me to participate in the care of your patient.SABRA Marcey Penton, MD

## 2024-03-29 ENCOUNTER — Encounter: Payer: Self-pay | Admitting: Oncology

## 2024-03-29 ENCOUNTER — Inpatient Hospital Stay

## 2024-03-29 ENCOUNTER — Inpatient Hospital Stay: Attending: Oncology

## 2024-03-29 VITALS — BP 146/76 | HR 88 | Temp 98.2°F | Resp 18

## 2024-03-29 DIAGNOSIS — R197 Diarrhea, unspecified: Secondary | ICD-10-CM | POA: Diagnosis not present

## 2024-03-29 DIAGNOSIS — F419 Anxiety disorder, unspecified: Secondary | ICD-10-CM | POA: Insufficient documentation

## 2024-03-29 DIAGNOSIS — Z5111 Encounter for antineoplastic chemotherapy: Secondary | ICD-10-CM | POA: Insufficient documentation

## 2024-03-29 DIAGNOSIS — C787 Secondary malignant neoplasm of liver and intrahepatic bile duct: Secondary | ICD-10-CM | POA: Insufficient documentation

## 2024-03-29 DIAGNOSIS — G620 Drug-induced polyneuropathy: Secondary | ICD-10-CM | POA: Insufficient documentation

## 2024-03-29 DIAGNOSIS — Z5189 Encounter for other specified aftercare: Secondary | ICD-10-CM | POA: Diagnosis not present

## 2024-03-29 DIAGNOSIS — Z7952 Long term (current) use of systemic steroids: Secondary | ICD-10-CM | POA: Diagnosis not present

## 2024-03-29 DIAGNOSIS — R162 Hepatomegaly with splenomegaly, not elsewhere classified: Secondary | ICD-10-CM | POA: Diagnosis not present

## 2024-03-29 DIAGNOSIS — C189 Malignant neoplasm of colon, unspecified: Secondary | ICD-10-CM | POA: Insufficient documentation

## 2024-03-29 DIAGNOSIS — E876 Hypokalemia: Secondary | ICD-10-CM | POA: Insufficient documentation

## 2024-03-29 DIAGNOSIS — R188 Other ascites: Secondary | ICD-10-CM | POA: Diagnosis not present

## 2024-03-29 DIAGNOSIS — Z79899 Other long term (current) drug therapy: Secondary | ICD-10-CM | POA: Insufficient documentation

## 2024-03-29 DIAGNOSIS — Z1509 Genetic susceptibility to other malignant neoplasm: Secondary | ICD-10-CM | POA: Insufficient documentation

## 2024-03-29 DIAGNOSIS — Z7984 Long term (current) use of oral hypoglycemic drugs: Secondary | ICD-10-CM | POA: Insufficient documentation

## 2024-03-29 DIAGNOSIS — T451X5A Adverse effect of antineoplastic and immunosuppressive drugs, initial encounter: Secondary | ICD-10-CM | POA: Diagnosis not present

## 2024-03-29 DIAGNOSIS — D649 Anemia, unspecified: Secondary | ICD-10-CM | POA: Insufficient documentation

## 2024-03-29 DIAGNOSIS — C187 Malignant neoplasm of sigmoid colon: Secondary | ICD-10-CM | POA: Insufficient documentation

## 2024-03-29 DIAGNOSIS — Z933 Colostomy status: Secondary | ICD-10-CM | POA: Insufficient documentation

## 2024-03-29 DIAGNOSIS — K631 Perforation of intestine (nontraumatic): Secondary | ICD-10-CM | POA: Diagnosis not present

## 2024-03-29 LAB — COMPREHENSIVE METABOLIC PANEL WITH GFR
ALT: 23 U/L (ref 0–44)
AST: 42 U/L — ABNORMAL HIGH (ref 15–41)
Albumin: 3.6 g/dL (ref 3.5–5.0)
Alkaline Phosphatase: 202 U/L — ABNORMAL HIGH (ref 38–126)
Anion gap: 14 (ref 5–15)
BUN: 8 mg/dL (ref 8–23)
CO2: 22 mmol/L (ref 22–32)
Calcium: 8.8 mg/dL — ABNORMAL LOW (ref 8.9–10.3)
Chloride: 99 mmol/L (ref 98–111)
Creatinine, Ser: 0.96 mg/dL (ref 0.44–1.00)
GFR, Estimated: >60 mL/min (ref >60)
Glucose, Bld: 145 mg/dL — ABNORMAL HIGH (ref 70–99)
Potassium: 3.3 mmol/L — ABNORMAL LOW (ref 3.5–5.1)
Sodium: 135 mmol/L (ref 135–145)
Total Bilirubin: 1.2 mg/dL (ref 0.0–1.2)
Total Protein: 6.4 g/dL — ABNORMAL LOW (ref 6.5–8.1)

## 2024-03-29 LAB — CBC WITH DIFFERENTIAL/PLATELET
Abs Immature Granulocytes: 0.06 K/uL (ref 0.00–0.07)
Basophils Absolute: 0 K/uL (ref 0.0–0.1)
Basophils Relative: 0 %
Eosinophils Absolute: 0.1 K/uL (ref 0.0–0.5)
Eosinophils Relative: 1 %
HCT: 31.4 % — ABNORMAL LOW (ref 36.0–46.0)
Hemoglobin: 10 g/dL — ABNORMAL LOW (ref 12.0–15.0)
Immature Granulocytes: 1 %
Lymphocytes Relative: 12 %
Lymphs Abs: 0.9 K/uL (ref 0.7–4.0)
MCH: 30.3 pg (ref 26.0–34.0)
MCHC: 31.8 g/dL (ref 30.0–36.0)
MCV: 95.2 fL (ref 80.0–100.0)
Monocytes Absolute: 1 K/uL (ref 0.1–1.0)
Monocytes Relative: 14 %
Neutro Abs: 5.1 K/uL (ref 1.7–7.7)
Neutrophils Relative %: 72 %
Platelets: 87 K/uL — ABNORMAL LOW (ref 150–400)
RBC: 3.3 MIL/uL — ABNORMAL LOW (ref 3.87–5.11)
RDW: 21.2 % — ABNORMAL HIGH (ref 11.5–15.5)
WBC: 7.2 K/uL (ref 4.0–10.5)
nRBC: 0 % (ref 0.0–0.2)

## 2024-03-29 LAB — MAGNESIUM: Magnesium: 2 mg/dL (ref 1.7–2.4)

## 2024-03-29 MED ORDER — DEXAMETHASONE SODIUM PHOSPHATE 10 MG/ML IJ SOLN
10.0000 mg | Freq: Once | INTRAMUSCULAR | Status: AC
  Administered 2024-03-29: 10 mg via INTRAVENOUS
  Filled 2024-03-29: qty 1

## 2024-03-29 MED ORDER — ATROPINE SULFATE 1 MG/ML IV SOLN
0.5000 mg | Freq: Once | INTRAVENOUS | Status: AC
  Administered 2024-03-29: 0.5 mg via INTRAVENOUS
  Filled 2024-03-29: qty 1

## 2024-03-29 MED ORDER — SODIUM CHLORIDE 0.9 % IV SOLN
144.0000 mg/m2 | Freq: Once | INTRAVENOUS | Status: AC
  Administered 2024-03-29: 260 mg via INTRAVENOUS
  Filled 2024-03-29: qty 13

## 2024-03-29 MED ORDER — SODIUM CHLORIDE 0.9 % IV SOLN
5.0000 mg/kg | Freq: Once | INTRAVENOUS | Status: AC
  Administered 2024-03-29: 400 mg via INTRAVENOUS
  Filled 2024-03-29: qty 16

## 2024-03-29 MED ORDER — POTASSIUM CHLORIDE CRYS ER 20 MEQ PO TBCR
40.0000 meq | EXTENDED_RELEASE_TABLET | Freq: Once | ORAL | Status: AC
  Administered 2024-03-29: 40 meq via ORAL
  Filled 2024-03-29: qty 2

## 2024-03-29 MED ORDER — SODIUM CHLORIDE 0.9 % IV SOLN: INTRAVENOUS | Status: DC

## 2024-03-29 MED ORDER — FLUOROURACIL CHEMO INJECTION 2.5 GM/50ML
320.0000 mg/m2 | Freq: Once | INTRAVENOUS | Status: AC
  Administered 2024-03-29: 600 mg via INTRAVENOUS
  Filled 2024-03-29: qty 12

## 2024-03-29 MED ORDER — SODIUM CHLORIDE 0.9 % IV SOLN
1920.0000 mg/m2 | INTRAVENOUS | Status: DC
  Administered 2024-03-29: 3500 mg via INTRAVENOUS
  Filled 2024-03-29: qty 70

## 2024-03-29 MED ORDER — SODIUM CHLORIDE 0.9 % IV SOLN
320.0000 mg/m2 | Freq: Once | INTRAVENOUS | Status: AC
  Administered 2024-03-29: 608 mg via INTRAVENOUS
  Filled 2024-03-29: qty 30.4

## 2024-03-29 MED ORDER — PALONOSETRON HCL INJECTION 0.25 MG/5ML
0.2500 mg | Freq: Once | INTRAVENOUS | Status: AC
  Administered 2024-03-29: 0.25 mg via INTRAVENOUS
  Filled 2024-03-29: qty 5

## 2024-03-29 NOTE — Progress Notes (Signed)
 Patient presents today for MVASI /Folfiri infusion.  Patient is in satisfactory condition with no new complaints voiced.  Vital signs are stable.  Labs reviewed and all other labs are within treatment parameters. Patient's platelets noted to be 87. Dr.Kandala made aware.  We will proceed with treatment per MD orders.    Treatment given today per MD orders. Tolerated infusion without adverse affects. Vital signs stable. No complaints at this time. Discharged from clinic ambulatory in stable condition. Alert and oriented x 3. F/U with [facility] as scheduled. 5FU ambulatory pump infusing with no alarms beeping.

## 2024-03-29 NOTE — Patient Instructions (Signed)
 [facility] [place] - [facility] DE[facility]  Discharge Instructions: Thank you for choosing [facility] to provide your oncology and hematology care.  If you have a lab appointment with the Cancer Center - please note that after April 8th, 2024, all labs will be drawn in the cancer center.  You do not have to check in or register with the main entrance as you have in the past but will complete your check-in in the cancer center.  Wear comfortable clothing and clothing appropriate for easy access to any Portacath or PICC line.   We strive to give you quality time with your provider. You may need to reschedule your appointment if you arrive late (15 or more minutes).  Arriving late affects you and other patients whose appointments are after yours.  Also, if you miss three or more appointments without notifying the office, you may be dismissed from the clinic at the provider's discretion.      For prescription refill requests, have your pharmacy contact our office and allow 72 hours for refills to be completed.    Today you received the following chemotherapy and/or immunotherapy agents MVASI/Folfiri   To help prevent nausea and vomiting after your treatment, we encourage you to take your nausea medication as directed.   BELOW ARE SYMPTOMS THAT SHOULD BE REPORTED IMMEDIATELY: *FEVER GREATER THAN 100.4 F (38 C) OR HIGHER *CHILLS OR SWEATING *NAUSEA AND VOMITING THAT IS NOT CONTROLLED WITH YOUR NAUSEA MEDICATION *UNUSUAL SHORTNESS OF BREATH *UNUSUAL BRUISING OR BLEEDING *URINARY PROBLEMS (pain or burning when urinating, or frequent urination) *BOWEL PROBLEMS (unusual diarrhea, constipation, pain near the anus) TENDERNESS IN MOUTH AND THROAT WITH OR WITHOUT PRESENCE OF ULCERS (sore throat, sores in mouth, or a toothache) UNUSUAL RASH, SWELLING OR PAIN  UNUSUAL VAGINAL DISCHARGE OR ITCHING   Items with * indicate a potential emergency and should be followed  up as soon as possible or go to the Emergency Department if any problems should occur.  Please show the CHEMOTHERAPY ALERT CARD or IMMUNOTHERAPY ALERT CARD at check-in to the Emergency Department and triage nurse.  Should you have questions after your visit or need to cancel or reschedule your appointment, please contact [facility]ER [facility] [place] - A DEPT OF Eligha Bridegroom [facility] [phone]  and follow the prompts.  Office hours are 8:00 a.m. to 4:30 p.m. Monday - Friday. Please note that voicemails left after 4:00 p.m. may not be returned until the following business day.  We are closed weekends and major holidays. You have access to a nurse at all times for urgent questions. Please call the main number to the clinic [phone] and follow the prompts.  For any non-urgent questions, you may also contact your provider using MyChart. We now offer e-Visits for anyone 8 and older to request care online for non-urgent symptoms. For details visit [email or web address].   Also download the MyChart app! Go to the app store, search "MyChart", open the app, select [place], and log in with your MyChart username and password.

## 2024-03-29 NOTE — Progress Notes (Signed)
   03/29/24 1300  Spiritual Encounters  Type of Visit Initial  Care provided to: Patient  Referral source Chaplain assessment  Reason for visit  (Introduction to Spiritual Care)  OnCall Visit No  Spiritual Framework  Presenting Themes Values and beliefs;Caregiving needs;Coping tools   Reason for Visit: Chaplain identified Pt on the schedule as a Pt I had not connected with yet and visited to deliver introduction to Spiritual Care  Description of Visit: Upon arrival I found Delcie sitting in the recliner receiving her treatment with no support person present.  I introduced myself as the chaplain for the cancer center and offered a brief education on the role of a chaplain and the support we can offer to our patients, caregivers, and staff.    Kamea shared with me that both she has recently had to begin radiation treatments and that this is an unexpected disappointment- she was hoping to be able to avoid radiation.  Shayley shared that she lives alone, but her brother lives next door and is a primary caregiver for her.  She mentioned also that he is the lead deacon at his church, where she also attends, though it has been awhile she has been able to make it out.  Nieshia was open to me following her treatment as an extra layer of support and so I will put her on my schedule.  Plan of Care: I will continue to follow up with Dlynn on a bi-weekly basis.     Maude Roll, MDiv  Chaplain, [facility] [email or web address] [phone]

## 2024-03-29 NOTE — Progress Notes (Signed)
 Bevacizumab  dose today is 400 mg (5 mg/kg) with current weight 72.1 kg.  Niels Molt, PharmD

## 2024-03-30 ENCOUNTER — Encounter: Payer: Self-pay | Admitting: Oncology

## 2024-03-30 ENCOUNTER — Ambulatory Visit
Admission: RE | Admit: 2024-03-30 | Discharge: 2024-03-30 | Disposition: A | Discharge status: Home / Self Care | Source: Ambulatory Visit | Attending: Radiation Oncology | Admitting: Radiation Oncology

## 2024-03-30 DIAGNOSIS — C787 Secondary malignant neoplasm of liver and intrahepatic bile duct: Secondary | ICD-10-CM | POA: Diagnosis not present

## 2024-03-30 DIAGNOSIS — R918 Other nonspecific abnormal finding of lung field: Secondary | ICD-10-CM | POA: Diagnosis not present

## 2024-03-30 DIAGNOSIS — Z510 Encounter for antineoplastic radiation therapy: Secondary | ICD-10-CM | POA: Diagnosis present

## 2024-03-30 DIAGNOSIS — C187 Malignant neoplasm of sigmoid colon: Secondary | ICD-10-CM | POA: Diagnosis not present

## 2024-03-30 LAB — CEA: CEA: 12.9 ng/mL — ABNORMAL HIGH (ref 0.0–4.7)

## 2024-03-31 ENCOUNTER — Inpatient Hospital Stay

## 2024-03-31 VITALS — BP 135/66 | HR 81 | Temp 98.4°F | Resp 18

## 2024-03-31 DIAGNOSIS — C787 Secondary malignant neoplasm of liver and intrahepatic bile duct: Secondary | ICD-10-CM

## 2024-03-31 DIAGNOSIS — Z5111 Encounter for antineoplastic chemotherapy: Secondary | ICD-10-CM | POA: Diagnosis not present

## 2024-03-31 MED ORDER — PEGFILGRASTIM-JMDB 6 MG/0.6ML ~~LOC~~ SOSY
6.0000 mg | PREFILLED_SYRINGE | Freq: Once | SUBCUTANEOUS | Status: AC
  Administered 2024-03-31: 6 mg via SUBCUTANEOUS
  Filled 2024-03-31: qty 0.6

## 2024-03-31 NOTE — Progress Notes (Signed)
 Patient presents today for 5FU chemotherapy pump disconnection and Fuliphila per provider's order. Vital signs stable and patient voiced no new complaints at this time.   Port flushed easily with 10 mL of normal saline and good blood return noted and needle removed intact. No bruising or swelling noted at the site.  Discharged from clinic ambulatory in stable condition. Alert and oriented x 3. F/U with [facility] as scheduled.

## 2024-03-31 NOTE — Patient Instructions (Signed)
 [facility] [place] - A DEPT OF [facility]  Discharge Instructions: Thank you for choosing [facility] to provide your oncology and hematology care.  If you have a lab appointment with the Cancer Center - please note that after April 8th, 2024, all labs will be drawn in the cancer center.  You do not have to check in or register with the main entrance as you have in the past but will complete your check-in in the cancer center.  Wear comfortable clothing and clothing appropriate for easy access to any Portacath or PICC line.   We strive to give you quality time with your provider. You may need to reschedule your appointment if you arrive late (15 or more minutes).  Arriving late affects you and other patients whose appointments are after yours.  Also, if you miss three or more appointments without notifying the office, you may be dismissed from the clinic at the provider's discretion.      For prescription refill requests, have your pharmacy contact our office and allow 72 hours for refills to be completed.    Today you received the following chemotherapy and/or immunotherapy agents 5FU chemotherapy pump disconnection.     To help prevent nausea and vomiting after your treatment, we encourage you to take your nausea medication as directed.  BELOW ARE SYMPTOMS THAT SHOULD BE REPORTED IMMEDIATELY: *FEVER GREATER THAN 100.4 F (38 C) OR HIGHER *CHILLS OR SWEATING *NAUSEA AND VOMITING THAT IS NOT CONTROLLED WITH YOUR NAUSEA MEDICATION *UNUSUAL SHORTNESS OF BREATH *UNUSUAL BRUISING OR BLEEDING *URINARY PROBLEMS (pain or burning when urinating, or frequent urination) *BOWEL PROBLEMS (unusual diarrhea, constipation, pain near the anus) TENDERNESS IN MOUTH AND THROAT WITH OR WITHOUT PRESENCE OF ULCERS (sore throat, sores in mouth, or a toothache) UNUSUAL RASH, SWELLING OR PAIN  UNUSUAL VAGINAL DISCHARGE OR ITCHING   Items with * indicate a potential emergency  and should be followed up as soon as possible or go to the Emergency Department if any problems should occur.  Please show the CHEMOTHERAPY ALERT CARD or IMMUNOTHERAPY ALERT CARD at check-in to the Emergency Department and triage nurse.  Should you have questions after your visit or need to cancel or reschedule your appointment, please contact [facility] [place] - A DEPT OF [facility] [phone]  and follow the prompts.  Office hours are 8:00 a.m. to 4:30 p.m. Monday - Friday. Please note that voicemails left after 4:00 p.m. may not be returned until the following business day.  We are closed weekends and major holidays. You have access to a nurse at all times for urgent questions. Please call the main number to the clinic [phone] and follow the prompts.  For any non-urgent questions, you may also contact your provider using MyChart. We now offer e-Visits for anyone 63 and older to request care online for non-urgent symptoms. For details visit [email or web address].   Also download the MyChart app! Go to the app store, search MyChart, open the app, select [place], and log in with your MyChart username and password.

## 2024-04-05 DIAGNOSIS — Z510 Encounter for antineoplastic radiation therapy: Secondary | ICD-10-CM | POA: Diagnosis not present

## 2024-04-07 ENCOUNTER — Other Ambulatory Visit: Payer: Self-pay

## 2024-04-07 MED ORDER — GABAPENTIN 100 MG PO CAPS: ORAL_CAPSULE | ORAL | 3 refills | Status: DC

## 2024-04-10 ENCOUNTER — Encounter: Payer: Self-pay | Admitting: Oncology

## 2024-04-10 ENCOUNTER — Ambulatory Visit

## 2024-04-11 ENCOUNTER — Other Ambulatory Visit: Payer: Self-pay

## 2024-04-11 ENCOUNTER — Ambulatory Visit
Admission: RE | Admit: 2024-04-11 | Discharge: 2024-04-11 | Disposition: A | Source: Ambulatory Visit | Attending: Radiation Oncology | Admitting: Radiation Oncology

## 2024-04-11 DIAGNOSIS — Z510 Encounter for antineoplastic radiation therapy: Secondary | ICD-10-CM | POA: Diagnosis not present

## 2024-04-11 LAB — RAD ONC ARIA SESSION SUMMARY
Course Elapsed Days: 0
Plan Fractions Treated to Date: 1
Plan Prescribed Dose Per Fraction: 10 Gy
Plan Total Fractions Prescribed: 5
Plan Total Prescribed Dose: 50 Gy
Reference Point Dosage Given to Date: 10 Gy
Reference Point Session Dosage Given: 10 Gy
Session Number: 1

## 2024-04-12 ENCOUNTER — Other Ambulatory Visit: Payer: Self-pay | Admitting: *Deleted

## 2024-04-12 ENCOUNTER — Ambulatory Visit

## 2024-04-12 ENCOUNTER — Inpatient Hospital Stay: Admitting: Oncology

## 2024-04-12 ENCOUNTER — Encounter: Payer: Self-pay | Admitting: Oncology

## 2024-04-12 ENCOUNTER — Inpatient Hospital Stay

## 2024-04-12 VITALS — BP 133/65 | HR 85 | Temp 97.7°F | Resp 18

## 2024-04-12 DIAGNOSIS — E876 Hypokalemia: Secondary | ICD-10-CM

## 2024-04-12 DIAGNOSIS — R197 Diarrhea, unspecified: Secondary | ICD-10-CM | POA: Diagnosis not present

## 2024-04-12 DIAGNOSIS — C787 Secondary malignant neoplasm of liver and intrahepatic bile duct: Secondary | ICD-10-CM | POA: Diagnosis not present

## 2024-04-12 DIAGNOSIS — C189 Malignant neoplasm of colon, unspecified: Secondary | ICD-10-CM

## 2024-04-12 DIAGNOSIS — E119 Type 2 diabetes mellitus without complications: Secondary | ICD-10-CM | POA: Diagnosis not present

## 2024-04-12 DIAGNOSIS — Z5111 Encounter for antineoplastic chemotherapy: Secondary | ICD-10-CM | POA: Diagnosis not present

## 2024-04-12 LAB — COMPREHENSIVE METABOLIC PANEL WITH GFR
ALT: 30 U/L (ref 0–44)
AST: 49 U/L — ABNORMAL HIGH (ref 15–41)
Albumin: 3.5 g/dL (ref 3.5–5.0)
Alkaline Phosphatase: 231 U/L — ABNORMAL HIGH (ref 38–126)
Anion gap: 11 (ref 5–15)
BUN: 7 mg/dL — ABNORMAL LOW (ref 8–23)
CO2: 24 mmol/L (ref 22–32)
Calcium: 8.7 mg/dL — ABNORMAL LOW (ref 8.9–10.3)
Chloride: 98 mmol/L (ref 98–111)
Creatinine, Ser: 0.82 mg/dL (ref 0.44–1.00)
GFR, Estimated: >60 mL/min (ref >60)
Glucose, Bld: 132 mg/dL — ABNORMAL HIGH (ref 70–99)
Potassium: 3.2 mmol/L — ABNORMAL LOW (ref 3.5–5.1)
Sodium: 134 mmol/L — ABNORMAL LOW (ref 135–145)
Total Bilirubin: 1.1 mg/dL (ref 0.0–1.2)
Total Protein: 6.3 g/dL — ABNORMAL LOW (ref 6.5–8.1)

## 2024-04-12 LAB — CBC WITH DIFFERENTIAL/PLATELET
Abs Immature Granulocytes: 0.06 K/uL (ref 0.00–0.07)
Basophils Absolute: 0 K/uL (ref 0.0–0.1)
Basophils Relative: 1 %
Eosinophils Absolute: 0.1 K/uL (ref 0.0–0.5)
Eosinophils Relative: 2 %
HCT: 31.7 % — ABNORMAL LOW (ref 36.0–46.0)
Hemoglobin: 10.1 g/dL — ABNORMAL LOW (ref 12.0–15.0)
Immature Granulocytes: 1 %
Lymphocytes Relative: 13 %
Lymphs Abs: 1 K/uL (ref 0.7–4.0)
MCH: 30.1 pg (ref 26.0–34.0)
MCHC: 31.9 g/dL (ref 30.0–36.0)
MCV: 94.3 fL (ref 80.0–100.0)
Monocytes Absolute: 1.1 K/uL — ABNORMAL HIGH (ref 0.1–1.0)
Monocytes Relative: 13 %
Neutro Abs: 5.9 K/uL (ref 1.7–7.7)
Neutrophils Relative %: 70 %
Platelets: 100 K/uL — ABNORMAL LOW (ref 150–400)
RBC: 3.36 MIL/uL — ABNORMAL LOW (ref 3.87–5.11)
RDW: 21 % — ABNORMAL HIGH (ref 11.5–15.5)
WBC: 8.2 K/uL (ref 4.0–10.5)
nRBC: 0 % (ref 0.0–0.2)

## 2024-04-12 LAB — URINALYSIS, DIPSTICK ONLY
Bilirubin Urine: NEGATIVE
Glucose, UA: NEGATIVE mg/dL
Hgb urine dipstick: NEGATIVE
Ketones, ur: NEGATIVE mg/dL
Nitrite: NEGATIVE
Protein, ur: NEGATIVE mg/dL
Specific Gravity, Urine: 1.009 (ref 1.005–1.030)
pH: 6 (ref 5.0–8.0)

## 2024-04-12 LAB — MAGNESIUM: Magnesium: 2.2 mg/dL (ref 1.7–2.4)

## 2024-04-12 MED ORDER — DEXAMETHASONE SOD PHOSPHATE PF 10 MG/ML IJ SOLN
10.0000 mg | Freq: Once | INTRAMUSCULAR | Status: AC
  Administered 2024-04-12: 10 mg via INTRAVENOUS
  Filled 2024-04-12: qty 1

## 2024-04-12 MED ORDER — SODIUM CHLORIDE 0.9 % IV SOLN
320.0000 mg/m2 | Freq: Once | INTRAVENOUS | Status: AC
  Administered 2024-04-12: 608 mg via INTRAVENOUS
  Filled 2024-04-12: qty 30.4

## 2024-04-12 MED ORDER — SODIUM CHLORIDE 0.9 % IV SOLN: INTRAVENOUS | Status: DC

## 2024-04-12 MED ORDER — LORAZEPAM 0.5 MG PO TABS: 0.5000 mg | ORAL_TABLET | Freq: Three times a day (TID) | ORAL | 2 refills | Status: DC

## 2024-04-12 MED ORDER — SODIUM CHLORIDE 0.9 % IV SOLN
1920.0000 mg/m2 | INTRAVENOUS | Status: DC
  Administered 2024-04-12: 3500 mg via INTRAVENOUS
  Filled 2024-04-12: qty 70

## 2024-04-12 MED ORDER — SODIUM CHLORIDE 0.9 % IV SOLN
5.0000 mg/kg | Freq: Once | INTRAVENOUS | Status: AC
  Administered 2024-04-12: 400 mg via INTRAVENOUS
  Filled 2024-04-12: qty 16

## 2024-04-12 MED ORDER — POTASSIUM CHLORIDE CRYS ER 20 MEQ PO TBCR
40.0000 meq | EXTENDED_RELEASE_TABLET | Freq: Once | ORAL | Status: AC
  Administered 2024-04-12: 40 meq via ORAL
  Filled 2024-04-12: qty 2

## 2024-04-12 MED ORDER — SODIUM CHLORIDE 0.9 % IV SOLN
144.0000 mg/m2 | Freq: Once | INTRAVENOUS | Status: AC
  Administered 2024-04-12: 260 mg via INTRAVENOUS
  Filled 2024-04-12: qty 13

## 2024-04-12 MED ORDER — PALONOSETRON HCL INJECTION 0.25 MG/5ML
0.2500 mg | Freq: Once | INTRAVENOUS | Status: AC
  Administered 2024-04-12: 0.25 mg via INTRAVENOUS
  Filled 2024-04-12: qty 5

## 2024-04-12 MED ORDER — FLUOROURACIL CHEMO INJECTION 2.5 GM/50ML
320.0000 mg/m2 | Freq: Once | INTRAVENOUS | Status: AC
  Administered 2024-04-12: 600 mg via INTRAVENOUS
  Filled 2024-04-12: qty 12

## 2024-04-12 MED ORDER — ATROPINE SULFATE 1 MG/ML IV SOLN
0.5000 mg | Freq: Once | INTRAVENOUS | Status: AC
  Administered 2024-04-12: 0.5 mg via INTRAVENOUS
  Filled 2024-04-12: qty 1

## 2024-04-12 NOTE — Progress Notes (Signed)
 Continue bevacizumab  at 400 mg despite slight weight loss.  V.O. Dr Ivery Molt, PharmD

## 2024-04-12 NOTE — Patient Instructions (Signed)
 [facility] [place] - A DEPT OF . [facility]  Discharge Instructions: Thank you for choosing [facility] to provide your oncology and hematology care.  If you have a lab appointment with the Cancer Center - please note that after April 8th, 2024, all labs will be drawn in the cancer center.  You do not have to check in or register with the main entrance as you have in the past but will complete your check-in in the cancer center.  Wear comfortable clothing and clothing appropriate for easy access to any Portacath or PICC line.   We strive to give you quality time with your provider. You may need to reschedule your appointment if you arrive late (15 or more minutes).  Arriving late affects you and other patients whose appointments are after yours.  Also, if you miss three or more appointments without notifying the office, you may be dismissed from the clinic at the provider's discretion.      For prescription refill requests, have your pharmacy contact our office and allow 72 hours for refills to be completed.    Today you received the following chemotherapy and/or immunotherapy agents MVASI  FOLFIRI 5FU pump      To help prevent nausea and vomiting after your treatment, we encourage you to take your nausea medication as directed.  BELOW ARE SYMPTOMS THAT SHOULD BE REPORTED IMMEDIATELY: *FEVER GREATER THAN 100.4 F (38 C) OR HIGHER *CHILLS OR SWEATING *NAUSEA AND VOMITING THAT IS NOT CONTROLLED WITH YOUR NAUSEA MEDICATION *UNUSUAL SHORTNESS OF BREATH *UNUSUAL BRUISING OR BLEEDING *URINARY PROBLEMS (pain or burning when urinating, or frequent urination) *BOWEL PROBLEMS (unusual diarrhea, constipation, pain near the anus) TENDERNESS IN MOUTH AND THROAT WITH OR WITHOUT PRESENCE OF ULCERS (sore throat, sores in mouth, or a toothache) UNUSUAL RASH, SWELLING OR PAIN  UNUSUAL VAGINAL DISCHARGE OR ITCHING   Items with * indicate a potential emergency and should  be followed up as soon as possible or go to the Emergency Department if any problems should occur.  Please show the CHEMOTHERAPY ALERT CARD or IMMUNOTHERAPY ALERT CARD at check-in to the Emergency Department and triage nurse.  Should you have questions after your visit or need to cancel or reschedule your appointment, please contact [facility] [place] - A DEPT [facility] [phone]  and follow the prompts.  Office hours are 8:00 a.m. to 4:30 p.m. Monday - Friday. Please note that voicemails left after 4:00 p.m. may not be returned until the following business day.  We are closed weekends and major holidays. You have access to a nurse at all times for urgent questions. Please call the main number to the clinic [phone] and follow the prompts.  For any non-urgent questions, you may also contact your provider using MyChart. We now offer e-Visits for anyone 24 and older to request care online for non-urgent symptoms. For details visit [email or web address].   Also download the MyChart app! Go to the app store, search MyChart, open the app, select [place], and log in with your MyChart username and password.

## 2024-04-12 NOTE — Patient Instructions (Signed)

## 2024-04-12 NOTE — Progress Notes (Addendum)
 Patient Care Team: Rogers Hai, MD (Inactive) as PCP - General (Hematology) Celestia Joesph SQUIBB, RN as Oncology Nurse Navigator (Oncology) Lenn Aran, MD as Consulting Physician (Radiation Oncology)  Clinic Day:  04/12/2024  Referring physician: No ref. 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 Patient had SBRT done to lung lesions and finished it in 01/2024.  Restarted recently.   - Currently receiving SBRT to the lung lesions that are growing. - Labs reviewed today: CMP: Normal creatinine and stable LFTs.  Sodium: 134.  CBC: Hemoglobin: 10.1, WBC normal and platelets of 100.  UA with no proteinuria -Physical exam stable.  Proceed with chemotherapy with FOLFIRI and bevacizumab  today - Will repeat a CT scan in 3 months - Will obtain CEA with every other cycle -I discussed with Dr.Chrystal prior to proceeding with chemotherapy.  Return to clinic in 1 month with labs for follow-up  Normocytic anemia Likely secondary to chemotherapy. Anemia panel today within normal limits   - Continue to monitor  Peripheral neuropathy due to chemotherapy Numbness and tingling in hands improving post-oxaliplatin  discontinuation, managed with gabapentin .   Diarrhea Occasional diarrhea managed with Imodium, attributed to chemotherapy.   Hypokalemia Likely secondary to diarrhea Continue oral potassium supplementation 20  mEq daily  Anxiety Patient reports some symptoms not improved with Klonopin   -Recommended trying Ativan  0.5 mg 3 times daily as needed  The patient understands the plans discussed today and is in agreement with them.  She knows to contact our office if she develops concerns prior to her next appointment.  The total time spent in the appointment was 40 minutes for the encounter with patient, including review of chart and various tests results, discussions about plan of care and coordination of care plan    Mickiel Dry, MD  [facility] [place] - A DEPT OF [facility] [address] [place] [address] Dept: [phone] Dept Fax: [phone]   No orders of the defined types were placed in this encounter.    ONCOLOGY HISTORY:   I have reviewed her chart and materials related to her cancer extensively and collaborated history with the patient. 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 There is pharyngeal wall thickening within the mid sigmoid colon with surrounding extraluminal gas and fluid collections compatible with abscess. Free air is also noted in the upper abdomen adjacent to the liver. Numerous low-density masses throughout the liver. Appearance is concerning for metastases. -04/29/2020: Sigmoid colectomy and end colostomy Pathology: Adenocarcinoma, moderately differentiated, 3 cm  -  Lymphovascular space and perineural invasion present  -  No carcinoma identified in eight lymph nodes (0/8) .  - MMR preserved. MSI-stable. - pT4a, pN1c  -05/02/2020: CEA : 60.2 -05/02/2020: CT chest: No definite evidence of metastatic disease to the chest with tiny 2 mm RIGHT middle  lobe pulmonary nodule.  Numerous low-attenuation lesions in the liver are more compatible with hepatic metastatic disease  rather than hepatic abscess. -05/03/2020: Liver biopsy.   Pathology: Adenocarcinoma- Morphology compatible with colorectal carcinoma -05/07/2020: CT AP: Post distal colonic resection with sigmoid colostomy and Hartmann pouch. Previously identified distal sigmoid tumor no longer seen. Suspected hepatic metastases.  -06/04/2020-11/06/2020: FOLFOX and bevacizumab   -05/03/2020: [facility] testing: MSI-stable, KRAS/NRAS wild-type, TMB: low 61mut/Mb, ARId1A, MAP2K2,TP53,APC: positive -11/20/2020-02/17/2023: 5FU with bevacizumab , discontinued due to progression -02/25/2023: CT CAP: Increased size and conspicuity of the hepatic metastasis. New scattered bilateral pulmonary nodules, concerning for pulmonary metastasis. Increased nodular mesenteric and omental stranding, concerning for peritoneal carcinomatosis.  -03/10/2023-05/24/2023: FOLFOX + bevacizumab  (oxaliplatin  readded), discontinued due to progression -06/02/2023: CT CAP: Numerous bilateral pulmonary nodules, new and enlarged compared to prior examination, consistent with worsened pulmonary metastatic disease. Several very ill-defined liver metastases, some of which are not appreciably changed, others of which appear to have slightly enlarged.  -06/09/2023-current: FOLFIRI and bevacizumab  -01/10/2024-02/17/2024: SBRT to lung lesions [facility]) -03/01/2024: CEA:12.8 -03/07/2024: CT CAP:  Innumerable bilateral pulmonary nodules some of which have increased in size and others which are stable. Bilobar hepatic lesions are overall stable from prior examination. New small volume free fluid in the left hemiabdomen/pelvis with similar peritoneal thickening and peritoneal/omental stranding.  - 04/11/2024: Started on SBRT for lung lesions  Current Treatment:  FOLFIRI and bevacizumab   INTERVAL HISTORY:   Susan Martin is here today for follow up for metastatic colon adenocarcinoma.   Susan Martin is feeling tired from the radiation treatment.    She reports  that clonazepam  has not been helping her anxiety.   Susan Martin is still experiencing worsening numbness and tingling in her hands. She is taking two potassium pills daily. She has experienced some diarrhea, but reports no nausea or vomiting.  I have reviewed the past medical history, past surgical history, social history and family history with the patient and they are unchanged from previous note.  ALLERGIES:  has no known allergies.  MEDICATIONS:  Current Outpatient Medications  Medication Sig Dispense Refill   amLODipine  (NORVASC ) 10 MG tablet TAKE 1 TABLET BY MOUTH EVERY DAY 90 tablet 2   amLODipine  (NORVASC ) 5 MG tablet TAKE 1 TABLET (5 MG TOTAL) BY MOUTH DAILY. 90 tablet 1   BEVACIZUMAB  IV Inject 5 mg/kg into the vein every 14 (fourteen) days.     blood glucose meter kit and supplies KIT Dispense based on patient and insurance preference. Use up to four times daily as directed. 1 each 6   clonazePAM  (KLONOPIN ) 1 MG tablet TAKE 1 TABLET (1 MG TOTAL) BY MOUTH IN THE MORNING, AT NOON, AND AT BEDTIME. 90 tablet 3   dexamethasone  (DECADRON ) 2 MG tablet Take 1 tablet (2 mg total) by mouth as directed for 20 doses. 20 tablet 0   fluorouracil  CALGB 19297 in sodium chloride  0.9 % 150 mL Inject 2,400 mg/m2 into the vein over 48 hr.     gabapentin  (NEURONTIN ) 100 MG capsule TAKE 1 CAPSULE (100 MG TOTAL) BY MOUTH 3 TIMES A DAY 90 capsule 3   glimepiride  (AMARYL ) 1 MG tablet TAKE 1 TABLET BY MOUTH EVERY DAY WITH BREAKFAST 90 tablet 1   Lancets (ONETOUCH DELICA PLUS LANCET33G) MISC Apply topically.     lansoprazole  (PREVACID ) 30 MG capsule Take 1 capsule (30 mg total) by mouth daily at 12 noon. 30 capsule 0   LEUCOVORIN  CALCIUM  IV Inject 400 mg/m2 into the vein every 21 ( twenty-one) days.  lidocaine  (XYLOCAINE ) 2 % solution Use as directed 15 mLs in the mouth or throat every 6 (six) hours as needed for mouth pain. Swish and spit/swallow every six hours as needed for mouth pain 480 mL 3    lidocaine -prilocaine  (EMLA ) cream Apply 1 Application topically as needed. 30 g 0   Misc. Devices MISC Please provide with New Image Cera Plus 2 3/4 (2 - piece Ostomy skin barrier flange Ref # [id number]) & New Image  2 3/4 (2 - piece drainable Ostomy pouches Ref # [id number]) 5 Box 12   nystatin (MYCOSTATIN) 100000 UNIT/ML suspension Take by mouth.     ONETOUCH ULTRA test strip Use as instructed 100 each 6   OXALIPLATIN  IV Inject into the vein every 14 (fourteen) days.     oxyCODONE  (OXY IR/ROXICODONE ) 5 MG immediate release tablet Take 5 mg by mouth every 6 (six) hours as needed. for pain     PARoxetine  (PAXIL ) 40 MG tablet Take 1 tablet (40 mg total) by mouth every morning. 90 tablet 3   polyethylene glycol (MIRALAX  / GLYCOLAX ) packet Take 17 g by mouth every other day.     potassium chloride  (KLOR-CON  M10) 10 MEQ tablet Take 2 tablets (20 mEq total) by mouth daily. 180 tablet 3   sucralfate  (CARAFATE ) 1 g tablet Take 1 tablet (1 g total) by mouth 2 (two) times daily. Dissolve tablet in 4 T of warm water  swish and swallow morning and evening. 60 tablet 1   triamterene -hydrochlorothiazide  (MAXZIDE -25) 37.5-25 MG tablet TAKE 1 TABLET BY MOUTH EVERY DAY 90 tablet 1   LORazepam  (ATIVAN ) 0.5 MG tablet Take 1 tablet (0.5 mg total) by mouth 3 (three) times daily. 90 tablet 2   No current facility-administered medications for this visit.   Facility-Administered Medications Ordered in Other Visits  Medication Dose Route Frequency Provider Last Rate Last Admin   0.9 %  sodium chloride  infusion   Intravenous Continuous Analys Ryden, MD   Stopped at 04/12/24 1239   fluorouracil  (ADRUCIL ) 3,500 mg in sodium chloride  0.9 % 80 mL chemo infusion  1,920 mg/m2 (Order-Specific) Intravenous 1 day or 1 dose Tyris Eliot, MD   Infusion Verify at 04/12/24 1240    REVIEW OF SYSTEMS:   Constitutional: Denies fevers, chills or abnormal weight loss Eyes: Denies blurriness of vision Ears, nose, mouth, throat, and  face: Denies mucositis or sore throat Respiratory: Denies cough, dyspnea or wheezes Cardiovascular: Denies palpitation, chest discomfort or lower extremity swelling Gastrointestinal:  Denies nausea, heartburn or change in bowel habits Skin: Denies abnormal skin rashes Lymphatics: Denies new lymphadenopathy or easy bruising Neurological:Denies numbness, tingling or new weaknesses Behavioral/Psych: Mood is stable, no new changes  All other systems were reviewed with the patient and are negative.   VITALS:  There were no vitals taken for this visit.  Wt Readings from Last 3 Encounters:  04/12/24 157 lb 6.4 oz (71.4 kg)  03/29/24 159 lb (72.1 kg)  03/08/24 157 lb 3.2 oz (71.3 kg)    There is no height or weight on file to calculate BMI.  Performance status (ECOG): 1 - Symptomatic but completely ambulatory  PHYSICAL EXAM:   GENERAL:alert, no distress and comfortable SKIN: skin color, texture, turgor are normal, no rashes or significant lesions LYMPH:  no palpable lymphadenopathy in the cervical, axillary or inguinal LUNGS: clear to auscultation and percussion with normal breathing effort HEART: regular rate & rhythm and no murmurs and no lower extremity edema ABDOMEN:abdomen soft, non-tender and normal bowel sounds, colostomy bag  in place Musculoskeletal:no cyanosis of digits and no clubbing  NEURO: alert & oriented x 3 with fluent speech, no focal motor/sensory deficits  LABORATORY DATA:  I have reviewed the data as listed   Lab Results  Component Value Date   WBC 8.2 04/12/2024   NEUTROABS 5.9 04/12/2024   HGB 10.1 (L) 04/12/2024   HCT 31.7 (L) 04/12/2024   MCV 94.3 04/12/2024   PLT 100 (L) 04/12/2024      Chemistry      Component Value Date/Time   NA 134 (L) 04/12/2024 0808   NA 140 03/05/2016 1201   K 3.2 (L) 04/12/2024 0808   CL 98 04/12/2024 0808   CO2 24 04/12/2024 0808   BUN 7 (L) 04/12/2024 0808   BUN 10 03/05/2016 1201   CREATININE 0.82 04/12/2024 0808       Component Value Date/Time   CALCIUM  8.7 (L) 04/12/2024 0808   ALKPHOS 231 (H) 04/12/2024 0808   AST 49 (H) 04/12/2024 0808   ALT 30 04/12/2024 0808   BILITOT 1.1 04/12/2024 0808   BILITOT 0.3 03/05/2016 1201       Latest Reference Range & Units 03/29/24 09:02  CEA 0.0 - 4.7 ng/mL 12.9 (H)  (H): Data is abnormally high  RADIOGRAPHIC STUDIES: I have personally reviewed the radiological images as listed and agreed with the findings in the report.  CT CHEST ABDOMEN PELVIS W CONTRAST CLINICAL DATA:  Metastatic disease evaluation restaging colon cancer history of partial colon resection and colostomy with liver metastasis. * Tracking Code: BO *  EXAM: CT CHEST, ABDOMEN, AND PELVIS WITH CONTRAST  TECHNIQUE: Multidetector CT imaging of the chest, abdomen and pelvis was performed following the standard protocol during bolus administration of intravenous contrast.  RADIATION DOSE REDUCTION: This exam was performed according to the departmental dose-optimization program which includes automated exposure control, adjustment of the mA and/or kV according to patient size and/or use of iterative reconstruction technique.  CONTRAST:  85mL OMNIPAQUE  IOHEXOL  300 MG/ML  SOLN  COMPARISON:  Multiple priors including CT December 10, 2023  FINDINGS: CT CHEST FINDINGS  Cardiovascular: Right chest Port-A-Cath with tip near the superior cavoatrial junction. Normal caliber thoracic aorta. Normal size heart. No significant pericardial effusion/thickening.  Mediastinum/Nodes: No suspicious thyroid nodule. No pathologically enlarged mediastinal, hilar or axillary lymph nodes. Retained versus reflux contrast in the esophagus.  Lungs/Pleura: Bilateral pulmonary nodules are again seen some of which have increased in size others which are stable in size. For reference:  -Lobular right lower lobe pulmonary nodule measures 16 x 11 mm on image 97/3 previously 8 x 6 mm when remeasured for  consistency.  -Nodule in the left upper lobe measures 6 mm on image 24/3, unchanged.  -pulmonary nodule in the superior segment of the right lower lobe measures 9 x 8 mm on image 59/3 previously 10 x 7 mm when remeasured for consistency  Musculoskeletal: No aggressive lytic or blastic lesion of bone.  CT ABDOMEN PELVIS FINDINGS  Hepatobiliary: Nodular hepatic contour may reflect cirrhosis or pseudo cirrhosis.  Bilobar hepatic lesions are overall stable from prior examination. Indexed lesion with internal calcifications in the left anterior lobe of the liver along the falciform ligament measures 12 x 12 mm on image 53/2 previously 13 x 12 mm.  Mild wall thickening of the gallbladder possibly reflect sequela of chronic hepatic disease. No biliary ductal dilation.  Pancreas: Pancreatic ductal dilation or evidence of acute inflammation.  Spleen: Splenomegaly measuring 15.7 cm in maximum craniocaudal dimension.  Adrenals/Urinary  Tract: No suspicious adrenal nodule/mass. No hydronephrosis. Kidneys demonstrate symmetric enhancement. Urinary bladder is unremarkable for degree of distension.  Stomach/Bowel: Stomach is unremarkable for degree of distension. No pathologic dilation of small or large bowel. Prior partial left hemicolectomy with Hartmann's pouch formation and left anterior abdominal wall colostomy.  Vascular/Lymphatic: Normal caliber abdominal aorta. Smooth IVC contours. The portal, splenic and superior mesenteric veins are patent. No pathologically enlarged abdominal or pelvic lymph nodes.  Reproductive: Status post hysterectomy. No adnexal masses.  Other: New small volume free fluid in the left hemiabdomen/pelvis on image 94/2 similar peritoneal thickening and peritoneal/omental stranding. No new discrete peritoneal or omental nodularity.  Musculoskeletal: No aggressive lytic or blastic lesion of bone.  IMPRESSION: 1. Innumerable bilateral pulmonary nodules  some of which have increased in size and others which are stable. 2. Bilobar hepatic lesions are overall stable from prior examination. 3. New small volume free fluid in the left hemiabdomen/pelvis with similar peritoneal thickening and peritoneal/omental stranding. No new discrete peritoneal or omental nodularity, findings which are concerning for peritoneal carcinomatosis. Suggest close attention on follow-up imaging. 4. Nodular hepatic contour may reflect cirrhosis or pseudocirrhosis. 5. Splenomegaly. 6. Prior partial left hemicolectomy with Hartmann's pouch formation and left anterior abdominal wall colostomy.  Electronically Signed   By: Reyes Holder M.D.   On: 03/13/2024 17:01

## 2024-04-12 NOTE — Progress Notes (Signed)
 Patient presents today for MVASI /Folfiri with 5FU pump start per providers order.  Vital signs and labs reviewed by MD.  Message received from Isaiah Piety RN/Dr. Davonna patient okay for treatment.  Per MD standing order patient will receive 40 mEq of PO Potassium for a level of 3.2.  Treatment given today per MD orders.  Tolerated infusion without adverse affects.  Vital signs stable.  5FU pump connected and verified RUN on the screen with the patient.  No complaints at this time.  Discharge from clinic ambulatory in stable condition.  Alert and oriented X 3.  Follow up with [facility] as scheduled.

## 2024-04-12 NOTE — Progress Notes (Signed)
 Patient has been examined by Dr. Davonna. Vital signs and labs have been reviewed by MD - ANC, Creatinine, LFTs, hemoglobin, and platelets have been reviewed by M.D. - pt may proceed with treatment.  Primary RN and pharmacy notified.

## 2024-04-13 ENCOUNTER — Other Ambulatory Visit: Payer: Self-pay

## 2024-04-13 ENCOUNTER — Ambulatory Visit
Admission: RE | Admit: 2024-04-13 | Discharge: 2024-04-13 | Disposition: A | Discharge status: Home / Self Care | Source: Ambulatory Visit | Attending: Radiation Oncology | Admitting: Radiation Oncology

## 2024-04-13 ENCOUNTER — Encounter: Payer: Self-pay | Admitting: Oncology

## 2024-04-13 DIAGNOSIS — Z510 Encounter for antineoplastic radiation therapy: Secondary | ICD-10-CM | POA: Diagnosis not present

## 2024-04-13 LAB — RAD ONC ARIA SESSION SUMMARY
Course Elapsed Days: 2
Plan Fractions Treated to Date: 2
Plan Prescribed Dose Per Fraction: 10 Gy
Plan Total Fractions Prescribed: 5
Plan Total Prescribed Dose: 50 Gy
Reference Point Dosage Given to Date: 20 Gy
Reference Point Session Dosage Given: 10 Gy
Session Number: 2

## 2024-04-13 LAB — CEA: CEA: 14.7 ng/mL — ABNORMAL HIGH (ref 0.0–4.7)

## 2024-04-14 ENCOUNTER — Inpatient Hospital Stay

## 2024-04-14 VITALS — BP 135/65 | HR 98 | Temp 96.6°F | Resp 20

## 2024-04-14 DIAGNOSIS — Z5111 Encounter for antineoplastic chemotherapy: Secondary | ICD-10-CM | POA: Diagnosis not present

## 2024-04-14 DIAGNOSIS — C189 Malignant neoplasm of colon, unspecified: Secondary | ICD-10-CM

## 2024-04-14 MED ORDER — PEGFILGRASTIM-JMDB 6 MG/0.6ML ~~LOC~~ SOSY
6.0000 mg | PREFILLED_SYRINGE | Freq: Once | SUBCUTANEOUS | Status: AC
  Administered 2024-04-14: 6 mg via SUBCUTANEOUS
  Filled 2024-04-14: qty 0.6

## 2024-04-14 NOTE — Patient Instructions (Signed)
 [facility] [place] - A DEPT OF [facility]  Discharge Instructions: Thank you for choosing [facility] to provide your oncology and hematology care.  If you have a lab appointment with the Cancer Center - please note that after April 8th, 2024, all labs will be drawn in the cancer center.  You do not have to check in or register with the main entrance as you have in the past but will complete your check-in in the cancer center.  Wear comfortable clothing and clothing appropriate for easy access to any Portacath or PICC line.   We strive to give you quality time with your provider. You may need to reschedule your appointment if you arrive late (15 or more minutes).  Arriving late affects you and other patients whose appointments are after yours.  Also, if you miss three or more appointments without notifying the office, you may be dismissed from the clinic at the provider's discretion.      For prescription refill requests, have your pharmacy contact our office and allow 72 hours for refills to be completed.    Today you received the following chemotherapy and/or immunotherapy agents 5FU chemotherapy pump disconnection and Fulphila  injection.   To help prevent nausea and vomiting after your treatment, we encourage you to take your nausea medication as directed.  BELOW ARE SYMPTOMS THAT SHOULD BE REPORTED IMMEDIATELY: *FEVER GREATER THAN 100.4 F (38 C) OR HIGHER *CHILLS OR SWEATING *NAUSEA AND VOMITING THAT IS NOT CONTROLLED WITH YOUR NAUSEA MEDICATION *UNUSUAL SHORTNESS OF BREATH *UNUSUAL BRUISING OR BLEEDING *URINARY PROBLEMS (pain or burning when urinating, or frequent urination) *BOWEL PROBLEMS (unusual diarrhea, constipation, pain near the anus) TENDERNESS IN MOUTH AND THROAT WITH OR WITHOUT PRESENCE OF ULCERS (sore throat, sores in mouth, or a toothache) UNUSUAL RASH, SWELLING OR PAIN  UNUSUAL VAGINAL DISCHARGE OR ITCHING   Items with * indicate  a potential emergency and should be followed up as soon as possible or go to the Emergency Department if any problems should occur.  Please show the CHEMOTHERAPY ALERT CARD or IMMUNOTHERAPY ALERT CARD at check-in to the Emergency Department and triage nurse.  Should you have questions after your visit or need to cancel or reschedule your appointment, please contact [facility]  - A DEPT OF [facility] [phone]  and follow the prompts.  Office hours are 8:00 a.m. to 4:30 p.m. Monday - Friday. Please note that voicemails left after 4:00 p.m. may not be returned until the following business day.  We are closed weekends and major holidays. You have access to a nurse at all times for urgent questions. Please call the main number to the clinic [phone] and follow the prompts.  For any non-urgent questions, you may also contact your provider using MyChart. We now offer e-Visits for anyone 59 and older to request care online for non-urgent symptoms. For details visit [email or web address].   Also download the MyChart app! Go to the app store, search MyChart, open the app, select [place], and log in with your MyChart username and password.

## 2024-04-14 NOTE — Progress Notes (Signed)
 Patient presents today for 5FU chemotherapy pump disconnection and Fulphila  6mg  injection per provider's order. Vital signs stable and patient voiced no new complaints at this time.   Port flushed easily with 20 mL of normal saline. Good blood return noted and needle removed intact. No bruising or swelling noted at the site.  Discharged from clinic ambulatory in stable condition. Alert and oriented x 3. F/U with [facility] as scheduled.

## 2024-04-17 ENCOUNTER — Ambulatory Visit
Admission: RE | Admit: 2024-04-17 | Discharge: 2024-04-17 | Disposition: A | Discharge status: Home / Self Care | Source: Ambulatory Visit | Attending: Radiation Oncology | Admitting: Radiation Oncology

## 2024-04-17 ENCOUNTER — Other Ambulatory Visit: Payer: Self-pay

## 2024-04-17 DIAGNOSIS — Z510 Encounter for antineoplastic radiation therapy: Secondary | ICD-10-CM | POA: Diagnosis not present

## 2024-04-17 LAB — RAD ONC ARIA SESSION SUMMARY
Course Elapsed Days: 6
Plan Fractions Treated to Date: 3
Plan Prescribed Dose Per Fraction: 10 Gy
Plan Total Fractions Prescribed: 5
Plan Total Prescribed Dose: 50 Gy
Reference Point Dosage Given to Date: 30 Gy
Reference Point Session Dosage Given: 10 Gy
Session Number: 3

## 2024-04-19 ENCOUNTER — Other Ambulatory Visit: Payer: Self-pay

## 2024-04-19 ENCOUNTER — Ambulatory Visit
Admission: RE | Admit: 2024-04-19 | Discharge: 2024-04-19 | Disposition: A | Source: Ambulatory Visit | Attending: Radiation Oncology | Admitting: Radiation Oncology

## 2024-04-19 DIAGNOSIS — Z510 Encounter for antineoplastic radiation therapy: Secondary | ICD-10-CM | POA: Diagnosis not present

## 2024-04-19 LAB — RAD ONC ARIA SESSION SUMMARY
Course Elapsed Days: 8
Plan Fractions Treated to Date: 4
Plan Prescribed Dose Per Fraction: 10 Gy
Plan Total Fractions Prescribed: 5
Plan Total Prescribed Dose: 50 Gy
Reference Point Dosage Given to Date: 40 Gy
Reference Point Session Dosage Given: 10 Gy
Session Number: 4

## 2024-04-21 NOTE — Progress Notes (Signed)
 Susan Martin                                          MRN: [id number]   04/21/2024   The [facility]BC[facility] Quality Team Specialist reviewed this patient medical record for the purposes of chart review for care gap closure. The following were reviewed: chart review for care gap closure-diabetic eye exam and kidney health evaluation for diabetes:eGFR  and uACR.    VBCI Quality Team

## 2024-04-24 ENCOUNTER — Other Ambulatory Visit: Payer: Self-pay

## 2024-04-24 ENCOUNTER — Ambulatory Visit
Admission: RE | Admit: 2024-04-24 | Discharge: 2024-04-24 | Disposition: A | Discharge status: Home / Self Care | Source: Ambulatory Visit | Attending: Radiation Oncology | Admitting: Radiation Oncology

## 2024-04-24 DIAGNOSIS — Z510 Encounter for antineoplastic radiation therapy: Secondary | ICD-10-CM | POA: Diagnosis not present

## 2024-04-24 LAB — RAD ONC ARIA SESSION SUMMARY
Course Elapsed Days: 13
Plan Fractions Treated to Date: 5
Plan Prescribed Dose Per Fraction: 10 Gy
Plan Total Fractions Prescribed: 5
Plan Total Prescribed Dose: 50 Gy
Reference Point Dosage Given to Date: 50 Gy
Reference Point Session Dosage Given: 10 Gy
Session Number: 5

## 2024-04-25 ENCOUNTER — Other Ambulatory Visit: Payer: Self-pay | Admitting: *Deleted

## 2024-04-25 DIAGNOSIS — C189 Malignant neoplasm of colon, unspecified: Secondary | ICD-10-CM

## 2024-04-25 DIAGNOSIS — K5720 Diverticulitis of large intestine with perforation and abscess without bleeding: Secondary | ICD-10-CM

## 2024-04-25 MED ORDER — MISC. DEVICES MISC: 12 refills | Status: AC

## 2024-04-26 ENCOUNTER — Encounter: Payer: Self-pay | Admitting: Oncology

## 2024-04-26 ENCOUNTER — Inpatient Hospital Stay

## 2024-04-26 VITALS — BP 135/72 | HR 93 | Temp 97.7°F | Resp 18

## 2024-04-26 DIAGNOSIS — C189 Malignant neoplasm of colon, unspecified: Secondary | ICD-10-CM

## 2024-04-26 DIAGNOSIS — Z5111 Encounter for antineoplastic chemotherapy: Secondary | ICD-10-CM | POA: Diagnosis not present

## 2024-04-26 LAB — COMPREHENSIVE METABOLIC PANEL WITH GFR
ALT: 22 U/L (ref 0–44)
AST: 41 U/L (ref 15–41)
Albumin: 3.6 g/dL (ref 3.5–5.0)
Alkaline Phosphatase: 202 U/L — ABNORMAL HIGH (ref 38–126)
Anion gap: 13 (ref 5–15)
BUN: 8 mg/dL (ref 8–23)
CO2: 24 mmol/L (ref 22–32)
Calcium: 8.5 mg/dL — ABNORMAL LOW (ref 8.9–10.3)
Chloride: 100 mmol/L (ref 98–111)
Creatinine, Ser: 0.76 mg/dL (ref 0.44–1.00)
GFR, Estimated: >60 mL/min (ref >60)
Glucose, Bld: 139 mg/dL — ABNORMAL HIGH (ref 70–99)
Potassium: 3.5 mmol/L (ref 3.5–5.1)
Sodium: 137 mmol/L (ref 135–145)
Total Bilirubin: 1.1 mg/dL (ref 0.0–1.2)
Total Protein: 6.3 g/dL — ABNORMAL LOW (ref 6.5–8.1)

## 2024-04-26 LAB — CBC WITH DIFFERENTIAL/PLATELET
Abs Immature Granulocytes: 0.05 K/uL (ref 0.00–0.07)
Basophils Absolute: 0 K/uL (ref 0.0–0.1)
Basophils Relative: 1 %
Eosinophils Absolute: 0.1 K/uL (ref 0.0–0.5)
Eosinophils Relative: 2 %
HCT: 32.4 % — ABNORMAL LOW (ref 36.0–46.0)
Hemoglobin: 10.2 g/dL — ABNORMAL LOW (ref 12.0–15.0)
Immature Granulocytes: 1 %
Lymphocytes Relative: 12 %
Lymphs Abs: 0.7 K/uL (ref 0.7–4.0)
MCH: 30.2 pg (ref 26.0–34.0)
MCHC: 31.5 g/dL (ref 30.0–36.0)
MCV: 95.9 fL (ref 80.0–100.0)
Monocytes Absolute: 0.8 K/uL (ref 0.1–1.0)
Monocytes Relative: 13 %
Neutro Abs: 4.2 K/uL (ref 1.7–7.7)
Neutrophils Relative %: 71 %
Platelets: 81 K/uL — ABNORMAL LOW (ref 150–400)
RBC: 3.38 MIL/uL — ABNORMAL LOW (ref 3.87–5.11)
RDW: 21.5 % — ABNORMAL HIGH (ref 11.5–15.5)
WBC: 6 K/uL (ref 4.0–10.5)
nRBC: 0 % (ref 0.0–0.2)

## 2024-04-26 LAB — MAGNESIUM: Magnesium: 2.1 mg/dL (ref 1.7–2.4)

## 2024-04-26 MED ORDER — SODIUM CHLORIDE 0.9 % IV SOLN
144.0000 mg/m2 | Freq: Once | INTRAVENOUS | Status: AC
  Administered 2024-04-26: 260 mg via INTRAVENOUS
  Filled 2024-04-26: qty 13

## 2024-04-26 MED ORDER — SODIUM CHLORIDE 0.9 % IV SOLN: INTRAVENOUS | Status: DC

## 2024-04-26 MED ORDER — SODIUM CHLORIDE 0.9 % IV SOLN
320.0000 mg/m2 | Freq: Once | INTRAVENOUS | Status: AC
  Administered 2024-04-26: 608 mg via INTRAVENOUS
  Filled 2024-04-26: qty 30.4

## 2024-04-26 MED ORDER — ATROPINE SULFATE 1 MG/ML IV SOLN
0.5000 mg | Freq: Once | INTRAVENOUS | Status: AC
  Administered 2024-04-26: 0.5 mg via INTRAVENOUS
  Filled 2024-04-26: qty 1

## 2024-04-26 MED ORDER — DEXAMETHASONE SOD PHOSPHATE PF 10 MG/ML IJ SOLN
10.0000 mg | Freq: Once | INTRAMUSCULAR | Status: AC
  Administered 2024-04-26: 10 mg via INTRAVENOUS

## 2024-04-26 MED ORDER — SODIUM CHLORIDE 0.9 % IV SOLN
350.0000 mg | Freq: Once | INTRAVENOUS | Status: AC
  Administered 2024-04-26: 350 mg via INTRAVENOUS
  Filled 2024-04-26: qty 14

## 2024-04-26 MED ORDER — PALONOSETRON HCL INJECTION 0.25 MG/5ML
0.2500 mg | Freq: Once | INTRAVENOUS | Status: AC
  Administered 2024-04-26: 0.25 mg via INTRAVENOUS
  Filled 2024-04-26: qty 5

## 2024-04-26 MED ORDER — SODIUM CHLORIDE 0.9 % IV SOLN
1920.0000 mg/m2 | INTRAVENOUS | Status: DC
  Administered 2024-04-26: 3500 mg via INTRAVENOUS
  Filled 2024-04-26: qty 70

## 2024-04-26 MED ORDER — FLUOROURACIL CHEMO INJECTION 2.5 GM/50ML
320.0000 mg/m2 | Freq: Once | INTRAVENOUS | Status: AC
  Administered 2024-04-26: 600 mg via INTRAVENOUS
  Filled 2024-04-26: qty 12

## 2024-04-26 NOTE — Progress Notes (Signed)
 Patient tolerated chemotherapy with no complaints voiced.  Side effects with management reviewed with understanding verbalized.  Port site clean and dry with no bruising or swelling noted at site.  Good blood return noted before and after administration of chemotherapy.  Dressing intact with no alarms noted on pump.  Patient left in satisfactory condition with VSS and no s/s of distress noted.

## 2024-04-26 NOTE — Radiation Completion Notes (Signed)
 Patient Name: Susan Martin, ADDERLEY MRN: [id number] Date of Birth: [birth year] Referring Physician: MICKIEL DRY, M.D. Date of Service: 2024-04-26 Radiation Oncologist: Marcey Penton, M.D. [facility]                             RADIATION ONCOLOGY END OF TREATMENT NOTE     Diagnosis: R91.8 Other nonspecific abnormal finding of lung field Intent: Curative     HPI: Patient is a 63 year old female well-known to department having received 2 to courses of SBRT treatment 1 to her right hilar region as well as a right middle lobe for metastatic stage IV colorectal cancer ER she also has known liver metastasis.  She has been followed by.  Dr. Dry who on recent CT scan showed increasing right lower lobe pulmonary nodule now 16 x 11 mm previously 8 x 6 mm.  She is fairly asymptomatic from her prior treatment specifically [place] cough hemoptysis chest tightness or any change in her pulmonary status.  She is seen today for consideration of treatment to the right lower lobe lesion.      ==========DELIVERED PLANS==========  First Treatment Date: 2024-04-11 Last Treatment Date: 2024-04-24   Plan Name: Lung_RLL_SBRT Site: Lung, Right Technique: SBRT/SRT-IMRT Mode: Photon Dose Per Fraction: 10 Gy Prescribed Dose (Delivered / Prescribed): 50 Gy / 50 Gy Prescribed Fxs (Delivered / Prescribed): 5 / 5     ==========ON TREATMENT VISIT DATES========== 2024-04-11, 2024-04-13, 2024-04-17, 2024-04-17, 2024-04-19, 2024-04-24     ==========UPCOMING VISITS========== 07/10/2024 [facility] IMAGING CT CHEST W CONTRAST [facility]-CT  07/04/2024 [facility] CARE NEW PT - OFFICE VISIT Bevely Doffing, FNP  05/30/2024 [facility] RAD ONCOLOGY FOLLOW UP 30 Penton Marcey, MD  05/12/2024 [facility] PUMP START STOP [facility] NURSE  05/10/2024 [facility] PORT FLUSH W/LAB AP-[facility] NURSE  05/10/2024 [facility] INFUSION 4HR (240) AP-AC[facility]A CHAIR  1  05/10/2024 [facility] OFFICE VISIT 20 Dry Mickiel, MD  04/28/2024 [facility] PUMP START STOP AP-ACAPA NURSE  04/26/2024 [facility] PORT FLUSH W/LAB AP-ACAPA NURSE  04/26/2024 [facility] INFUSION 4HR (240) AP-AC[facility]A CHAIR 1        ==========APPENDIX - ON TREATMENT VISIT NOTES==========   See weekly On Treatment Notes in Epic for details in the Media tab (listed as Progress notes on the On Treatment Visit Dates listed above).

## 2024-04-26 NOTE — Patient Instructions (Signed)
 [facility] [place] - A DEPT OF [place]  Discharge Instructions: Thank you for choosing [facility] to provide your oncology and hematology care.  If you have a lab appointment with the Cancer Center - please note that after April 8th, 2024, all labs will be drawn in the cancer center.  You do not have to check in or register with the main entrance as you have in the past but will complete your check-in in the cancer center.  Wear comfortable clothing and clothing appropriate for easy access to any Portacath or PICC line.   We strive to give you quality time with your provider. You may need to reschedule your appointment if you arrive late (15 or more minutes).  Arriving late affects you and other patients whose appointments are after yours.  Also, if you miss three or more appointments without notifying the office, you may be dismissed from the clinic at the provider's discretion.      For prescription refill requests, have your pharmacy contact our office and allow 72 hours for refills to be completed.    Today you received the following chemotherapy and/or immunotherapy agents mvasi , irinotecan  and adruicil       To help prevent nausea and vomiting after your treatment, we encourage you to take your nausea medication as directed.  BELOW ARE SYMPTOMS THAT SHOULD BE REPORTED IMMEDIATELY: *FEVER GREATER THAN 100.4 F (38 C) OR HIGHER *CHILLS OR SWEATING *NAUSEA AND VOMITING THAT IS NOT CONTROLLED WITH YOUR NAUSEA MEDICATION *UNUSUAL SHORTNESS OF BREATH *UNUSUAL BRUISING OR BLEEDING *URINARY PROBLEMS (pain or burning when urinating, or frequent urination) *BOWEL PROBLEMS (unusual diarrhea, constipation, pain near the anus) TENDERNESS IN MOUTH AND THROAT WITH OR WITHOUT PRESENCE OF ULCERS (sore throat, sores in mouth, or a toothache) UNUSUAL RASH, SWELLING OR PAIN  UNUSUAL VAGINAL DISCHARGE OR ITCHING   Items with * indicate a potential emergency and  should be followed up as soon as possible or go to the Emergency Department if any problems should occur.  Please show the CHEMOTHERAPY ALERT CARD or IMMUNOTHERAPY ALERT CARD at check-in to the Emergency Department and triage nurse.  Should you have questions after your visit or need to cancel or reschedule your appointment, please contact [facility] [employer] - A DEPT OF [facility] [phone]  and follow the prompts.  Office hours are 8:00 a.m. to 4:30 p.m. Monday - Friday. Please note that voicemails left after 4:00 p.m. may not be returned until the following business day.  We are closed weekends and major holidays. You have access to a nurse at all times for urgent questions. Please call the main number to the clinic [phone] and follow the prompts.  For any non-urgent questions, you may also contact your provider using MyChart. We now offer e-Visits for anyone 60 and older to request care online for non-urgent symptoms. For details visit [email or web address].   Also download the MyChart app! Go to the app store, search MyChart, open the app, select [place], and log in with your MyChart username and password.

## 2024-04-26 NOTE — Progress Notes (Signed)
 Patient with new weight 68.7 kg   New weight Mvasi  5 mg/kg = 350 mg  Niels Molt, PharmD

## 2024-04-27 LAB — CEA: CEA: 12.7 ng/mL — ABNORMAL HIGH (ref 0.0–4.7)

## 2024-04-28 ENCOUNTER — Inpatient Hospital Stay

## 2024-04-28 VITALS — BP 155/69 | HR 91 | Temp 98.1°F | Resp 18

## 2024-04-28 DIAGNOSIS — Z5111 Encounter for antineoplastic chemotherapy: Secondary | ICD-10-CM | POA: Diagnosis not present

## 2024-04-28 DIAGNOSIS — C189 Malignant neoplasm of colon, unspecified: Secondary | ICD-10-CM

## 2024-04-28 MED ORDER — PEGFILGRASTIM-JMDB 6 MG/0.6ML ~~LOC~~ SOSY
6.0000 mg | PREFILLED_SYRINGE | Freq: Once | SUBCUTANEOUS | Status: AC
  Administered 2024-04-28: 6 mg via SUBCUTANEOUS
  Filled 2024-04-28: qty 0.6

## 2024-04-28 NOTE — Progress Notes (Signed)
 Patient presents today for pump d/c. Vital signs are stable. Port a cath site clean, dry, and intact. Port flushed with 20 mls of Normal Saline. Needle removed intact. Band aid applied. Patient has no concerns at this time. Discharged from clinic ambulatory in stable condition. Alert and oriented x 3. F/U with [facility] as scheduled.

## 2024-05-03 ENCOUNTER — Other Ambulatory Visit: Payer: Self-pay | Admitting: Oncology

## 2024-05-03 DIAGNOSIS — C787 Secondary malignant neoplasm of liver and intrahepatic bile duct: Secondary | ICD-10-CM

## 2024-05-09 NOTE — Progress Notes (Unsigned)
 Patient Care Team: Rogers Hai, MD (Inactive) as PCP - General (Hematology) Celestia Joesph SQUIBB, RN as Oncology Nurse Navigator (Oncology) Lenn Aran, MD as Consulting Physician (Radiation Oncology)  Clinic Day:  05/09/2024  Referring physician: No ref. 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 Patient had multiple SBRT to lung lesions . Last one completed on 04/24/2024.   - Labs reviewed today: CMP: Normal creatinine, AST:58, ALT:33, Bil:1.1.  Sodium: 137.  CBC: Hemoglobin: 10.1, WBC normal and platelets of 77.  UA with no proteinuria -Physical exam stable.  Proceed with chemotherapy with FOLFIRI and bevacizumab  today - Will repeat a CT scan in 3 months. Due prior to next visit - Will obtain CEA with every other cycle. Has trended down a little.   Return to clinic in 1 month with labs for follow-up  Normocytic anemia Likely secondary to chemotherapy. Anemia panel today within normal limits Stable Hb at this time.    - Continue to monitor  Peripheral neuropathy due to chemotherapy Numbness and tingling in hands improving post-oxaliplatin  discontinuation, managed with gabapentin  but reported recent drop of objects.  - Increase gabapentin  to 300 mg every 8 hours  Diarrhea Occasional diarrhea managed with Imodium, attributed to chemotherapy.   Hypokalemia Likely secondary to diarrhea. Potassium 4  today Continue oral potassium supplementation 20 mEq daily  -If potassium levels stay stable and high, can consider discontinuing oral potassium.   Anxiety Patient reports some symptoms not improved with Klonopin . Currently on ativan  which is helping tremendously  -Continue Ativan  0.5 mg 3 times daily as needed  The patient understands the plans discussed today and is in agreement with them.  She knows to contact our office if she develops concerns prior to her next appointment.  The total time spent in the appointment was 17 minutes for the encounter with patient, including review of chart and various tests results, discussions about plan of care and coordination of care plan    Mickiel Dry, MD  [facility] [place] - A DEPT OF [facility] [address] [place] [address] Dept: [phone] Dept Fax: [phone]   No orders of the defined types were placed in this encounter.    ONCOLOGY HISTORY:   I have reviewed her chart and materials related to her cancer extensively and collaborated history with the patient. 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 There is pharyngeal wall thickening within the mid sigmoid colon with surrounding extraluminal gas and fluid collections compatible with abscess. Free air is also noted in the upper abdomen adjacent to the liver. Numerous low-density masses throughout the liver. Appearance is concerning for metastases. -04/29/2020: Sigmoid colectomy and end colostomy Pathology: Adenocarcinoma, moderately differentiated, 3 cm  -  Lymphovascular space and perineural invasion present  -  No carcinoma identified in eight lymph nodes (  0/8) .  - MMR preserved. MSI-stable. - pT4a, pN1c  -05/02/2020: CEA : 60.2 -05/02/2020: CT chest: No definite evidence of  metastatic disease to the chest with tiny 2 mm RIGHT middle lobe pulmonary nodule.  Numerous low-attenuation lesions in the liver are more compatible with hepatic metastatic disease rather than hepatic abscess. -05/03/2020: Liver biopsy.   Pathology: Adenocarcinoma- Morphology compatible with colorectal carcinoma -05/07/2020: CT AP: Post distal colonic resection with sigmoid colostomy and Hartmann pouch. Previously identified distal sigmoid tumor no longer seen. Suspected hepatic metastases.  -06/04/2020-11/06/2020: FOLFOX and bevacizumab   -05/03/2020: [facility] testing: MSI-stable, KRAS/NRAS wild-type, TMB: low 81mut/Mb, ARId1A, MAP2K2,TP53,APC: positive -11/20/2020-02/17/2023: 5FU with bevacizumab , discontinued due to progression -02/25/2023: CT CAP: Increased size and conspicuity of the hepatic metastasis. New scattered bilateral pulmonary nodules, concerning for pulmonary metastasis. Increased nodular mesenteric and omental stranding, concerning for peritoneal carcinomatosis.  -03/10/2023-05/24/2023: FOLFOX + bevacizumab  (oxaliplatin  readded), discontinued due to progression -06/02/2023: CT CAP: Numerous bilateral pulmonary nodules, new and enlarged compared to prior examination, consistent with worsened pulmonary metastatic disease. Several very ill-defined liver metastases, some of which are not appreciably changed, others of which appear to have slightly enlarged.  -06/09/2023-current: FOLFIRI and bevacizumab  -01/10/2024-02/17/2024: SBRT to lung lesions [facility]) -03/01/2024: CEA:12.8 -03/07/2024: CT CAP:  Innumerable bilateral pulmonary nodules some of which have increased in size and others which are stable. Bilobar hepatic lesions are overall stable from prior examination. New small volume free fluid in the left hemiabdomen/pelvis with similar peritoneal thickening and peritoneal/omental stranding.  - 04/11/2024-04/24/2024: SBRT to the lung lesions  Current Treatment:  FOLFIRI and  bevacizumab   INTERVAL HISTORY:   Discussed the use of AI scribe software for clinical note transcription with the patient, who gave verbal consent to proceed.  History of Present Illness Susan Martin is a 63 year old female with colon cancer who presents for follow-up after completing radiation therapy.  She has completed radiation therapy, and her cancer marker has decreased slightly. She is scheduled for scans next month.  She experiences diarrhea, managed with imodium. No nausea or vomiting, but she feels tired and manages this by drinking Ensure, which she finds helpful.  She experiences numbness and tingling in her hands, severe enough to cause her to drop objects. She continues gabapentin  100 mg three times a day. Symptoms in her hands are worsening, though her feet are less affected.  She takes two potassium pills daily, maintaining stable potassium levels. She has switched from Klonopin  to Ativan  for anxiety, which she finds more effective.  She experiences flu-like symptoms for two to three days after receiving G CSF when her chemotherapy pump is removed, without nausea or vomiting.    I have reviewed the past medical history, past surgical history, social history and family history with the patient and they are unchanged from previous note.  ALLERGIES:  has no known allergies.  MEDICATIONS:  Current Outpatient Medications  Medication Sig Dispense Refill   amLODipine  (NORVASC ) 10 MG tablet TAKE 1 TABLET BY MOUTH EVERY DAY 90 tablet 2   amLODipine  (NORVASC ) 5 MG tablet TAKE 1 TABLET (5 MG TOTAL) BY MOUTH DAILY. 90 tablet 1   BEVACIZUMAB  IV Inject 5 mg/kg into the vein every 14 (fourteen) days.     blood glucose meter kit and supplies KIT Dispense based on patient and insurance preference. Use up to four times daily as directed. 1 each 6   clonazePAM  (KLONOPIN ) 1 MG tablet TAKE 1 TABLET (1 MG TOTAL) BY MOUTH IN THE MORNING, AT NOON, AND AT  BEDTIME. 90 tablet 3   dexamethasone   (DECADRON ) 2 MG tablet Take 1 tablet (2 mg total) by mouth as directed for 20 doses. 20 tablet 0   fluorouracil  CALGB 19297 in sodium chloride  0.9 % 150 mL Inject 2,400 mg/m2 into the vein over 48 hr.     gabapentin  (NEURONTIN ) 100 MG capsule TAKE 1 CAPSULE (100 MG TOTAL) BY MOUTH 3 TIMES A DAY 90 capsule 3   glimepiride  (AMARYL ) 1 MG tablet TAKE 1 TABLET BY MOUTH EVERY DAY WITH BREAKFAST 90 tablet 1   Lancets (ONETOUCH DELICA PLUS LANCET33G) MISC Apply topically.     lansoprazole  (PREVACID ) 30 MG capsule Take 1 capsule (30 mg total) by mouth daily at 12 noon. 30 capsule 0   LEUCOVORIN  CALCIUM  IV Inject 400 mg/m2 into the vein every 21 ( twenty-one) days.     lidocaine  (XYLOCAINE ) 2 % solution Use as directed 15 mLs in the mouth or throat every 6 (six) hours as needed for mouth pain. Swish and spit/swallow every six hours as needed for mouth pain 480 mL 3   lidocaine -prilocaine  (EMLA ) cream Apply 1 Application topically as needed. 30 g 0   LORazepam  (ATIVAN ) 0.5 MG tablet Take 1 tablet (0.5 mg total) by mouth 3 (three) times daily. 90 tablet 2   Misc. Devices MISC Please provide with New Image Cera Plus 2 3/4 (2 - piece Ostomy skin barrier flange Ref # [id number]) & New Image  2 3/4 (2 - piece drainable Ostomy pouches Ref # [id number]) 5 Box 12   nystatin (MYCOSTATIN) 100000 UNIT/ML suspension Take by mouth.     ONETOUCH ULTRA test strip Use as instructed 100 each 6   OXALIPLATIN  IV Inject into the vein every 14 (fourteen) days.     oxyCODONE  (OXY IR/ROXICODONE ) 5 MG immediate release tablet Take 5 mg by mouth every 6 (six) hours as needed. for pain     PARoxetine  (PAXIL ) 40 MG tablet Take 1 tablet (40 mg total) by mouth every morning. 90 tablet 3   polyethylene glycol (MIRALAX  / GLYCOLAX ) packet Take 17 g by mouth every other day.     potassium chloride  (KLOR-CON  M10) 10 MEQ tablet Take 2 tablets (20 mEq total) by mouth daily. 180 tablet 3   sucralfate  (CARAFATE ) 1 g tablet Take 1 tablet (1 g total) by  mouth 2 (two) times daily. Dissolve tablet in 4 T of warm water  swish and swallow morning and evening. 60 tablet 1   triamterene -hydrochlorothiazide  (MAXZIDE -25) 37.5-25 MG tablet TAKE 1 TABLET BY MOUTH EVERY DAY 90 tablet 1   No current facility-administered medications for this visit.    REVIEW OF SYSTEMS:   Constitutional: Denies fevers, chills or abnormal weight loss Eyes: Denies blurriness of vision Ears, nose, mouth, throat, and face: Denies mucositis or sore throat Respiratory: Denies cough, dyspnea or wheezes Cardiovascular: Denies palpitation, chest discomfort or lower extremity swelling Gastrointestinal:  Denies nausea, heartburn or change in bowel habits Skin: Denies abnormal skin rashes Lymphatics: Denies new lymphadenopathy or easy bruising Neurological:Denies numbness, tingling or new weaknesses Behavioral/Psych: Mood is stable, no new changes  All other systems were reviewed with the patient and are negative.   VITALS:  There were no vitals taken for this visit.  Wt Readings from Last 3 Encounters:  04/26/24 151 lb 7.3 oz (68.7 kg)  04/12/24 157 lb 6.4 oz (71.4 kg)  03/29/24 159 lb (72.1 kg)    There is no height or weight on file to calculate BMI.  Performance status (ECOG):  1 - Symptomatic but completely ambulatory  PHYSICAL EXAM:   GENERAL:alert, no distress and comfortable SKIN: skin color, texture, turgor are normal, no rashes or significant lesions LYMPH:  no palpable lymphadenopathy in the cervical, axillary or inguinal LUNGS: clear to auscultation and percussion with normal breathing effort HEART: regular rate & rhythm and no murmurs and no lower extremity edema ABDOMEN:abdomen soft, non-tender and normal bowel sounds, colostomy bag in place Musculoskeletal:no cyanosis of digits and no clubbing  NEURO: alert & oriented x 3 with fluent speech  LABORATORY DATA:  I have reviewed the data as listed   Lab Results  Component Value Date   WBC 6.0  04/26/2024   NEUTROABS 4.2 04/26/2024   HGB 10.2 (L) 04/26/2024   HCT 32.4 (L) 04/26/2024   MCV 95.9 04/26/2024   PLT 81 (L) 04/26/2024      Chemistry      Component Value Date/Time   NA 137 04/26/2024 0910   NA 140 03/05/2016 1201   K 3.5 04/26/2024 0910   CL 100 04/26/2024 0910   CO2 24 04/26/2024 0910   BUN 8 04/26/2024 0910   BUN 10 03/05/2016 1201   CREATININE 0.76 04/26/2024 0910      Component Value Date/Time   CALCIUM  8.5 (L) 04/26/2024 0910   ALKPHOS 202 (H) 04/26/2024 0910   AST 41 04/26/2024 0910   ALT 22 04/26/2024 0910   BILITOT 1.1 04/26/2024 0910   BILITOT 0.3 03/05/2016 1201       Latest Reference Range & Units 04/26/24 09:10  CEA 0.0 - 4.7 ng/mL 12.7 (H)  (H): Data is abnormally high   Latest Reference Range & Units 05/10/24 08:51  Appearance CLEAR  CLEAR  Bilirubin Urine NEGATIVE  NEGATIVE  Color, Urine YELLOW  YELLOW  Glucose, UA NEGATIVE mg/dL NEGATIVE  Hgb urine dipstick NEGATIVE  NEGATIVE  Ketones, ur NEGATIVE mg/dL NEGATIVE  Leukocytes,Ua NEGATIVE  NEGATIVE  Nitrite NEGATIVE  NEGATIVE  pH 5.0 - 8.0  6.0  Protein NEGATIVE mg/dL NEGATIVE  Specific Gravity, Urine 1.005 - 1.030  1.012    RADIOGRAPHIC STUDIES: I have personally reviewed the radiological images as listed and agreed with the findings in the report.

## 2024-05-10 ENCOUNTER — Other Ambulatory Visit: Payer: Self-pay | Admitting: *Deleted

## 2024-05-10 ENCOUNTER — Encounter: Payer: Self-pay | Admitting: Oncology

## 2024-05-10 ENCOUNTER — Inpatient Hospital Stay

## 2024-05-10 ENCOUNTER — Inpatient Hospital Stay: Attending: Hematology | Admitting: Oncology

## 2024-05-10 VITALS — BP 143/67 | HR 89 | Temp 98.0°F | Resp 18

## 2024-05-10 DIAGNOSIS — C787 Secondary malignant neoplasm of liver and intrahepatic bile duct: Secondary | ICD-10-CM | POA: Diagnosis not present

## 2024-05-10 DIAGNOSIS — C187 Malignant neoplasm of sigmoid colon: Secondary | ICD-10-CM | POA: Diagnosis not present

## 2024-05-10 DIAGNOSIS — Z5111 Encounter for antineoplastic chemotherapy: Secondary | ICD-10-CM | POA: Diagnosis present

## 2024-05-10 DIAGNOSIS — E876 Hypokalemia: Secondary | ICD-10-CM | POA: Diagnosis not present

## 2024-05-10 DIAGNOSIS — Z5189 Encounter for other specified aftercare: Secondary | ICD-10-CM | POA: Insufficient documentation

## 2024-05-10 DIAGNOSIS — D649 Anemia, unspecified: Secondary | ICD-10-CM | POA: Insufficient documentation

## 2024-05-10 DIAGNOSIS — Z5112 Encounter for antineoplastic immunotherapy: Secondary | ICD-10-CM | POA: Insufficient documentation

## 2024-05-10 DIAGNOSIS — Z923 Personal history of irradiation: Secondary | ICD-10-CM | POA: Diagnosis not present

## 2024-05-10 DIAGNOSIS — C189 Malignant neoplasm of colon, unspecified: Secondary | ICD-10-CM

## 2024-05-10 DIAGNOSIS — Z7952 Long term (current) use of systemic steroids: Secondary | ICD-10-CM | POA: Diagnosis not present

## 2024-05-10 DIAGNOSIS — C7800 Secondary malignant neoplasm of unspecified lung: Secondary | ICD-10-CM | POA: Insufficient documentation

## 2024-05-10 DIAGNOSIS — G620 Drug-induced polyneuropathy: Secondary | ICD-10-CM | POA: Insufficient documentation

## 2024-05-10 DIAGNOSIS — Z7984 Long term (current) use of oral hypoglycemic drugs: Secondary | ICD-10-CM | POA: Insufficient documentation

## 2024-05-10 DIAGNOSIS — T451X5A Adverse effect of antineoplastic and immunosuppressive drugs, initial encounter: Secondary | ICD-10-CM | POA: Diagnosis not present

## 2024-05-10 DIAGNOSIS — F419 Anxiety disorder, unspecified: Secondary | ICD-10-CM | POA: Insufficient documentation

## 2024-05-10 DIAGNOSIS — Z9221 Personal history of antineoplastic chemotherapy: Secondary | ICD-10-CM | POA: Insufficient documentation

## 2024-05-10 DIAGNOSIS — R197 Diarrhea, unspecified: Secondary | ICD-10-CM | POA: Diagnosis not present

## 2024-05-10 DIAGNOSIS — Z933 Colostomy status: Secondary | ICD-10-CM | POA: Insufficient documentation

## 2024-05-10 LAB — COMPREHENSIVE METABOLIC PANEL WITH GFR
ALT: 33 U/L (ref 0–44)
AST: 58 U/L — ABNORMAL HIGH (ref 15–41)
Albumin: 3.7 g/dL (ref 3.5–5.0)
Alkaline Phosphatase: 233 U/L — ABNORMAL HIGH (ref 38–126)
Anion gap: 12 (ref 5–15)
BUN: 9 mg/dL (ref 8–23)
CO2: 24 mmol/L (ref 22–32)
Calcium: 8.7 mg/dL — ABNORMAL LOW (ref 8.9–10.3)
Chloride: 101 mmol/L (ref 98–111)
Creatinine, Ser: 0.73 mg/dL (ref 0.44–1.00)
GFR, Estimated: >60 mL/min (ref >60)
Glucose, Bld: 143 mg/dL — ABNORMAL HIGH (ref 70–99)
Potassium: 4 mmol/L (ref 3.5–5.1)
Sodium: 137 mmol/L (ref 135–145)
Total Bilirubin: 1.1 mg/dL (ref 0.0–1.2)
Total Protein: 6.4 g/dL — ABNORMAL LOW (ref 6.5–8.1)

## 2024-05-10 LAB — CBC WITH DIFFERENTIAL/PLATELET
Abs Immature Granulocytes: 0.1 K/uL — ABNORMAL HIGH (ref 0.00–0.07)
Basophils Absolute: 0.1 K/uL (ref 0.0–0.1)
Basophils Relative: 1 %
Eosinophils Absolute: 0.1 K/uL (ref 0.0–0.5)
Eosinophils Relative: 1 %
HCT: 32.4 % — ABNORMAL LOW (ref 36.0–46.0)
Hemoglobin: 10.1 g/dL — ABNORMAL LOW (ref 12.0–15.0)
Immature Granulocytes: 1 %
Lymphocytes Relative: 10 %
Lymphs Abs: 0.8 K/uL (ref 0.7–4.0)
MCH: 30.1 pg (ref 26.0–34.0)
MCHC: 31.2 g/dL (ref 30.0–36.0)
MCV: 96.7 fL (ref 80.0–100.0)
Monocytes Absolute: 0.9 K/uL (ref 0.1–1.0)
Monocytes Relative: 11 %
Neutro Abs: 6.1 K/uL (ref 1.7–7.7)
Neutrophils Relative %: 76 %
Platelets: 77 K/uL — ABNORMAL LOW (ref 150–400)
RBC: 3.35 MIL/uL — ABNORMAL LOW (ref 3.87–5.11)
RDW: 22.7 % — ABNORMAL HIGH (ref 11.5–15.5)
WBC: 8.1 K/uL (ref 4.0–10.5)
nRBC: 0 % (ref 0.0–0.2)

## 2024-05-10 LAB — URINALYSIS, DIPSTICK ONLY
Bilirubin Urine: NEGATIVE
Glucose, UA: NEGATIVE mg/dL
Hgb urine dipstick: NEGATIVE
Ketones, ur: NEGATIVE mg/dL
Leukocytes,Ua: NEGATIVE
Nitrite: NEGATIVE
Protein, ur: NEGATIVE mg/dL
Specific Gravity, Urine: 1.012 (ref 1.005–1.030)
pH: 6 (ref 5.0–8.0)

## 2024-05-10 LAB — MAGNESIUM: Magnesium: 2.2 mg/dL (ref 1.7–2.4)

## 2024-05-10 MED ORDER — SODIUM CHLORIDE 0.9 % IV SOLN
320.0000 mg/m2 | Freq: Once | INTRAVENOUS | Status: AC
  Administered 2024-05-10: 608 mg via INTRAVENOUS
  Filled 2024-05-10: qty 30.4

## 2024-05-10 MED ORDER — SODIUM CHLORIDE 0.9 % IV SOLN
1920.0000 mg/m2 | INTRAVENOUS | Status: AC
  Administered 2024-05-10: 3500 mg via INTRAVENOUS
  Filled 2024-05-10: qty 70

## 2024-05-10 MED ORDER — TRIAMTERENE-HCTZ 37.5-25 MG PO TABS: 1.0000 | ORAL_TABLET | Freq: Every day | ORAL | 1 refills | Status: AC

## 2024-05-10 MED ORDER — ATROPINE SULFATE 1 MG/ML IV SOLN
0.5000 mg | Freq: Once | INTRAVENOUS | Status: AC
  Administered 2024-05-10: 0.5 mg via INTRAVENOUS
  Filled 2024-05-10: qty 1

## 2024-05-10 MED ORDER — AMLODIPINE BESYLATE 10 MG PO TABS: 10.0000 mg | ORAL_TABLET | Freq: Every day | ORAL | 2 refills | Status: AC

## 2024-05-10 MED ORDER — GABAPENTIN 300 MG PO CAPS
ORAL_CAPSULE | ORAL | 2 refills | Status: AC
Start: 2024-05-10 — End: ?

## 2024-05-10 MED ORDER — DEXAMETHASONE SOD PHOSPHATE PF 10 MG/ML IJ SOLN
10.0000 mg | Freq: Once | INTRAMUSCULAR | Status: AC
  Administered 2024-05-10: 10 mg via INTRAVENOUS

## 2024-05-10 MED ORDER — SODIUM CHLORIDE 0.9 % IV SOLN
5.0000 mg/kg | Freq: Once | INTRAVENOUS | Status: AC
  Administered 2024-05-10: 350 mg via INTRAVENOUS
  Filled 2024-05-10: qty 14

## 2024-05-10 MED ORDER — SODIUM CHLORIDE 0.9 % IV SOLN
144.0000 mg/m2 | Freq: Once | INTRAVENOUS | Status: AC
  Administered 2024-05-10: 260 mg via INTRAVENOUS
  Filled 2024-05-10: qty 13

## 2024-05-10 MED ORDER — SODIUM CHLORIDE 0.9 % IV SOLN: INTRAVENOUS | Status: DC

## 2024-05-10 MED ORDER — FLUOROURACIL CHEMO INJECTION 2.5 GM/50ML
320.0000 mg/m2 | Freq: Once | INTRAVENOUS | Status: AC
  Administered 2024-05-10: 600 mg via INTRAVENOUS
  Filled 2024-05-10: qty 12

## 2024-05-10 MED ORDER — PALONOSETRON HCL INJECTION 0.25 MG/5ML
0.2500 mg | Freq: Once | INTRAVENOUS | Status: AC
  Administered 2024-05-10: 0.25 mg via INTRAVENOUS
  Filled 2024-05-10: qty 5

## 2024-05-10 NOTE — Patient Instructions (Signed)
 [facility] [place] - A DEPT OF [place]  Discharge Instructions: Thank you for choosing [facility] to provide your oncology and hematology care.  If you have a lab appointment with the Cancer Center - please note that after April 8th, 2024, all labs will be drawn in the cancer center.  You do not have to check in or register with the main entrance as you have in the past but will complete your check-in in the cancer center.  Wear comfortable clothing and clothing appropriate for easy access to any Portacath or PICC line.   We strive to give you quality time with your provider. You may need to reschedule your appointment if you arrive late (15 or more minutes).  Arriving late affects you and other patients whose appointments are after yours.  Also, if you miss three or more appointments without notifying the office, you may be dismissed from the clinic at the provider's discretion.      For prescription refill requests, have your pharmacy contact our office and allow 72 hours for refills to be completed.    Today you received the following chemotherapy and/or immunotherapy agents MVASI , Irinotecan , leucovorin , and adruicil       To help prevent nausea and vomiting after your treatment, we encourage you to take your nausea medication as directed.  BELOW ARE SYMPTOMS THAT SHOULD BE REPORTED IMMEDIATELY: *FEVER GREATER THAN 100.4 F (38 C) OR HIGHER *CHILLS OR SWEATING *NAUSEA AND VOMITING THAT IS NOT CONTROLLED WITH YOUR NAUSEA MEDICATION *UNUSUAL SHORTNESS OF BREATH *UNUSUAL BRUISING OR BLEEDING *URINARY PROBLEMS (pain or burning when urinating, or frequent urination) *BOWEL PROBLEMS (unusual diarrhea, constipation, pain near the anus) TENDERNESS IN MOUTH AND THROAT WITH OR WITHOUT PRESENCE OF ULCERS (sore throat, sores in mouth, or a toothache) UNUSUAL RASH, SWELLING OR PAIN  UNUSUAL VAGINAL DISCHARGE OR ITCHING   Items with * indicate a potential  emergency and should be followed up as soon as possible or go to the Emergency Department if any problems should occur.  Please show the CHEMOTHERAPY ALERT CARD or IMMUNOTHERAPY ALERT CARD at check-in to the Emergency Department and triage nurse.  Should you have questions after your visit or need to cancel or reschedule your appointment, please contact [facility] [place] - A DEPT OF [facility] [phone]  and follow the prompts.  Office hours are 8:00 a.m. to 4:30 p.m. Monday - Friday. Please note that voicemails left after 4:00 p.m. may not be returned until the following business day.  We are closed weekends and major holidays. You have access to a nurse at all times for urgent questions. Please call the main number to the clinic [phone] and follow the prompts.  For any non-urgent questions, you may also contact your provider using MyChart. We now offer e-Visits for anyone 37 and older to request care online for non-urgent symptoms. For details visit [email or web address].   Also download the MyChart app! Go to the app store, search MyChart, open the app, select [place], and log in with your MyChart username and password.

## 2024-05-10 NOTE — Progress Notes (Signed)
 Patient has been examined by Dr. Davonna. Vital signs and labs have been reviewed by MD - ANC, Creatinine, LFTs, hemoglobin, and platelets (77) have been reviewed by M.D. - pt may proceed with treatment.  Primary RN and pharmacy notified.

## 2024-05-10 NOTE — Progress Notes (Signed)
 Patient tolerated chemotherapy with no complaints voiced.  Side effects with management reviewed with understanding verbalized.  Port site clean and dry with no bruising or swelling noted at site.  Good blood return noted before and after administration of chemotherapy.  Dressing intact with no alarms noted on pump.  Patient left in satisfactory condition with VSS and no s/s of distress noted.

## 2024-05-10 NOTE — Patient Instructions (Signed)

## 2024-05-11 ENCOUNTER — Encounter: Payer: Self-pay | Admitting: Oncology

## 2024-05-11 LAB — CEA: CEA: 13.8 ng/mL — ABNORMAL HIGH (ref 0.0–4.7)

## 2024-05-12 ENCOUNTER — Inpatient Hospital Stay

## 2024-05-12 DIAGNOSIS — Z5112 Encounter for antineoplastic immunotherapy: Secondary | ICD-10-CM | POA: Diagnosis not present

## 2024-05-12 DIAGNOSIS — Z5111 Encounter for antineoplastic chemotherapy: Secondary | ICD-10-CM | POA: Diagnosis not present

## 2024-05-12 DIAGNOSIS — C787 Secondary malignant neoplasm of liver and intrahepatic bile duct: Secondary | ICD-10-CM

## 2024-05-12 MED ORDER — PEGFILGRASTIM-JMDB 6 MG/0.6ML ~~LOC~~ SOSY
6.0000 mg | PREFILLED_SYRINGE | Freq: Once | SUBCUTANEOUS | Status: AC
  Administered 2024-05-12: 6 mg via SUBCUTANEOUS

## 2024-05-12 NOTE — Progress Notes (Signed)
 Patient presents today for 5FU pump stop and disconnection after 46 hour continous infusion.   5FU pump deaccessed.  Patients port flushed without difficulty.  Good blood return noted with no bruising or swelling noted at site.  Needle removed intact.  Band aid applied.  Fulphila  administration without incident; injection site WNL; see MAR for injection details.  No questions or complaints noted at this time. VSS with discharge and left in satisfactory condition via ambulatory with no s/s of distress noted.

## 2024-05-12 NOTE — Patient Instructions (Signed)
 [facility] [place] [facility]  Discharge Instructions: Thank you for choosing [facility] to provide your oncology and hematology care.  If you have a lab appointment with the Cancer Center - please note that after April 8th, 2024, all labs will be drawn in the cancer center.  You do not have to check in or register with the main entrance as you have in the past but will complete your check-in in the cancer center.  Wear comfortable clothing and clothing appropriate for easy access to any Portacath or PICC line.   We strive to give you quality time with your provider. You may need to reschedule your appointment if you arrive late (15 or more minutes).  Arriving late affects you and other patients whose appointments are after yours.  Also, if you miss three or more appointments without notifying the office, you may be dismissed from the clinic at the provider's discretion.      For prescription refill requests, have your pharmacy contact our office and allow 72 hours for refills to be completed.    Today you received the following chemotherapy and/or immunotherapy agents 5FU pump stop      To help prevent nausea and vomiting after your treatment, we encourage you to take your nausea medication as directed.  BELOW ARE SYMPTOMS THAT SHOULD BE REPORTED IMMEDIATELY: *FEVER GREATER THAN 100.4 F (38 C) OR HIGHER *CHILLS OR SWEATING *NAUSEA AND VOMITING THAT IS NOT CONTROLLED WITH YOUR NAUSEA MEDICATION *UNUSUAL SHORTNESS OF BREATH *UNUSUAL BRUISING OR BLEEDING *URINARY PROBLEMS (pain or burning when urinating, or frequent urination) *BOWEL PROBLEMS (unusual diarrhea, constipation, pain near the anus) TENDERNESS IN MOUTH AND THROAT WITH OR WITHOUT PRESENCE OF ULCERS (sore throat, sores in mouth, or a toothache) UNUSUAL RASH, SWELLING OR PAIN  UNUSUAL VAGINAL DISCHARGE OR ITCHING   Items with * indicate a potential emergency and should be  followed up as soon as possible or go to the Emergency Department if any problems should occur.  Please show the CHEMOTHERAPY ALERT CARD or IMMUNOTHERAPY ALERT CARD at check-in to the Emergency Department and triage nurse.  Should you have questions after your visit or need to cancel or reschedule your appointment, please contact [facility] [place] - A DEPT OF [facility] [phone]  and follow the prompts.  Office hours are 8:00 a.m. to 4:30 p.m. Monday - Friday. Please note that voicemails left after 4:00 p.m. may not be returned until the following business day.  We are closed weekends and major holidays. You have access to a nurse at all times for urgent questions. Please call the main number to the clinic [phone] and follow the prompts.  For any non-urgent questions, you may also contact your provider using MyChart. We now offer e-Visits for anyone 27 and older to request care online for non-urgent symptoms. For details visit [email or web address].   Also download the MyChart app! Go to the app store, search "MyChart", open the app, select [place], and log in with your MyChart username and password.

## 2024-05-15 ENCOUNTER — Encounter: Payer: Self-pay | Admitting: Oncology

## 2024-05-29 ENCOUNTER — Inpatient Hospital Stay

## 2024-05-30 ENCOUNTER — Encounter: Payer: Self-pay | Admitting: Radiation Oncology

## 2024-05-30 ENCOUNTER — Other Ambulatory Visit: Payer: Self-pay | Admitting: *Deleted

## 2024-05-30 ENCOUNTER — Ambulatory Visit
Admission: RE | Admit: 2024-05-30 | Discharge: 2024-05-30 | Disposition: A | Source: Ambulatory Visit | Attending: Radiation Oncology | Admitting: Radiation Oncology

## 2024-05-30 VITALS — BP 131/77 | HR 93 | Temp 98.1°F | Resp 20 | Wt 151.0 lb

## 2024-05-30 DIAGNOSIS — R918 Other nonspecific abnormal finding of lung field: Secondary | ICD-10-CM

## 2024-05-30 NOTE — Progress Notes (Signed)
 Radiation Oncology Follow up Note  Name: Susan Martin   Date:   05/30/2024 MRN:  [id number] DOB: 03/06/1961    This 63 y.o. female presents to the clinic today for 1 month follow-up status post SBRT for progressive right lower lobe lung lesion patient previously treated to her right middle lobe as well as right hilar region for stage IV metastatic colorectal cancer.SABRA  REFERRING PROVIDER: No ref. provider found  HPI: Patient is a 63 year old female well-known to department had multiple radiation treatments to her chest for metastatic colorectal cancer.  She developed a progressive right lower lobe lesion which we also did SBRT on.  She is seen today in routine follow-up and is doing well very low side effect profile she says she does have some dyspnea on exertion which may be slightly increased over time no cough hemoptysis or chest tightness at this time..  She is treated in any [facility] with 5-FU infusions.  COMPLICATIONS OF TREATMENT: none  FOLLOW UP COMPLIANCE: keeps appointments   PHYSICAL EXAM:  BP 131/77   Pulse 93   Temp 98.1 F (36.7 C) (Tympanic)   Resp 20   Wt 151 lb (68.5 kg)   BMI 26.75 kg/m  Well-developed well-nourished patient in NAD. HEENT reveals PERLA, EOMI, discs not visualized.  Oral cavity is clear. No oral mucosal lesions are identified. Neck is clear without evidence of cervical or supraclavicular adenopathy. Lungs are clear to A&P. Cardiac examination is essentially unremarkable with regular rate and rhythm without murmur rub or thrill. Abdomen is benign with no organomegaly or masses noted. Motor sensory and DTR levels are equal and symmetric in the upper and lower extremities. Cranial nerves II through XII are grossly intact. Proprioception is intact. No peripheral adenopathy or edema is identified. No motor or sensory levels are noted. Crude visual fields are within normal range.  RADIOLOGY RESULTS: CT scan ordered in 3 months  PLAN: At this time clinically patient  is doing well with very low side effect profile from SBRT.  Pleased with her overall progress she continues treatment at [facility] with intermittent 5-FU infusion.  I have asked to see her back in 3 months with a repeat CT scan of her chest at that time.  Patient knows to call with any concerns.  I would like to take this opportunity to thank you for allowing me to participate in the care of your patient.SABRA Marcey Penton, MD

## 2024-05-31 ENCOUNTER — Inpatient Hospital Stay

## 2024-05-31 ENCOUNTER — Inpatient Hospital Stay: Attending: Hematology

## 2024-05-31 ENCOUNTER — Encounter: Payer: Self-pay | Admitting: Oncology

## 2024-05-31 ENCOUNTER — Other Ambulatory Visit: Payer: Self-pay

## 2024-05-31 VITALS — BP 126/72 | HR 85 | Temp 97.0°F | Resp 20

## 2024-05-31 DIAGNOSIS — C7801 Secondary malignant neoplasm of right lung: Secondary | ICD-10-CM | POA: Insufficient documentation

## 2024-05-31 DIAGNOSIS — Z1509 Genetic susceptibility to other malignant neoplasm: Secondary | ICD-10-CM | POA: Insufficient documentation

## 2024-05-31 DIAGNOSIS — D6481 Anemia due to antineoplastic chemotherapy: Secondary | ICD-10-CM | POA: Insufficient documentation

## 2024-05-31 DIAGNOSIS — R162 Hepatomegaly with splenomegaly, not elsewhere classified: Secondary | ICD-10-CM | POA: Insufficient documentation

## 2024-05-31 DIAGNOSIS — K828 Other specified diseases of gallbladder: Secondary | ICD-10-CM | POA: Insufficient documentation

## 2024-05-31 DIAGNOSIS — Z7984 Long term (current) use of oral hypoglycemic drugs: Secondary | ICD-10-CM | POA: Diagnosis not present

## 2024-05-31 DIAGNOSIS — Z7952 Long term (current) use of systemic steroids: Secondary | ICD-10-CM | POA: Diagnosis not present

## 2024-05-31 DIAGNOSIS — C7802 Secondary malignant neoplasm of left lung: Secondary | ICD-10-CM | POA: Insufficient documentation

## 2024-05-31 DIAGNOSIS — J90 Pleural effusion, not elsewhere classified: Secondary | ICD-10-CM | POA: Insufficient documentation

## 2024-05-31 DIAGNOSIS — T451X5A Adverse effect of antineoplastic and immunosuppressive drugs, initial encounter: Secondary | ICD-10-CM | POA: Diagnosis not present

## 2024-05-31 DIAGNOSIS — C187 Malignant neoplasm of sigmoid colon: Secondary | ICD-10-CM | POA: Insufficient documentation

## 2024-05-31 DIAGNOSIS — I700 Atherosclerosis of aorta: Secondary | ICD-10-CM | POA: Insufficient documentation

## 2024-05-31 DIAGNOSIS — Z79899 Other long term (current) drug therapy: Secondary | ICD-10-CM | POA: Diagnosis not present

## 2024-05-31 DIAGNOSIS — C787 Secondary malignant neoplasm of liver and intrahepatic bile duct: Secondary | ICD-10-CM | POA: Insufficient documentation

## 2024-05-31 DIAGNOSIS — G620 Drug-induced polyneuropathy: Secondary | ICD-10-CM | POA: Insufficient documentation

## 2024-05-31 DIAGNOSIS — Z5111 Encounter for antineoplastic chemotherapy: Secondary | ICD-10-CM | POA: Diagnosis present

## 2024-05-31 DIAGNOSIS — R180 Malignant ascites: Secondary | ICD-10-CM | POA: Insufficient documentation

## 2024-05-31 DIAGNOSIS — C189 Malignant neoplasm of colon, unspecified: Secondary | ICD-10-CM

## 2024-05-31 LAB — COMPREHENSIVE METABOLIC PANEL WITH GFR
ALT: 23 U/L (ref 0–44)
AST: 44 U/L — ABNORMAL HIGH (ref 15–41)
Albumin: 3.7 g/dL (ref 3.5–5.0)
Alkaline Phosphatase: 210 U/L — ABNORMAL HIGH (ref 38–126)
Anion gap: 14 (ref 5–15)
BUN: 8 mg/dL (ref 8–23)
CO2: 20 mmol/L — ABNORMAL LOW (ref 22–32)
Calcium: 8.7 mg/dL — ABNORMAL LOW (ref 8.9–10.3)
Chloride: 103 mmol/L (ref 98–111)
Creatinine, Ser: 0.71 mg/dL (ref 0.44–1.00)
GFR, Estimated: >60 mL/min (ref >60)
Glucose, Bld: 159 mg/dL — ABNORMAL HIGH (ref 70–99)
Potassium: 3.8 mmol/L (ref 3.5–5.1)
Sodium: 137 mmol/L (ref 135–145)
Total Bilirubin: 1.2 mg/dL (ref 0.0–1.2)
Total Protein: 6.2 g/dL — ABNORMAL LOW (ref 6.5–8.1)

## 2024-05-31 LAB — CBC WITH DIFFERENTIAL/PLATELET
Abs Immature Granulocytes: 0.02 K/uL (ref 0.00–0.07)
Basophils Absolute: 0.1 K/uL (ref 0.0–0.1)
Basophils Relative: 1 %
Eosinophils Absolute: 0.2 K/uL (ref 0.0–0.5)
Eosinophils Relative: 3 %
HCT: 33.2 % — ABNORMAL LOW (ref 36.0–46.0)
Hemoglobin: 10.4 g/dL — ABNORMAL LOW (ref 12.0–15.0)
Immature Granulocytes: 0 %
Lymphocytes Relative: 13 %
Lymphs Abs: 0.9 K/uL (ref 0.7–4.0)
MCH: 30.3 pg (ref 26.0–34.0)
MCHC: 31.3 g/dL (ref 30.0–36.0)
MCV: 96.8 fL (ref 80.0–100.0)
Monocytes Absolute: 1 K/uL (ref 0.1–1.0)
Monocytes Relative: 16 %
Neutro Abs: 4.3 K/uL (ref 1.7–7.7)
Neutrophils Relative %: 67 %
Platelets: 106 K/uL — ABNORMAL LOW (ref 150–400)
RBC: 3.43 MIL/uL — ABNORMAL LOW (ref 3.87–5.11)
RDW: 21.7 % — ABNORMAL HIGH (ref 11.5–15.5)
WBC: 6.4 K/uL (ref 4.0–10.5)
nRBC: 0 % (ref 0.0–0.2)

## 2024-05-31 LAB — MAGNESIUM: Magnesium: 2.1 mg/dL (ref 1.7–2.4)

## 2024-05-31 MED ORDER — FLUOROURACIL CHEMO INJECTION 2.5 GM/50ML
320.0000 mg/m2 | Freq: Once | INTRAVENOUS | Status: AC
  Administered 2024-05-31: 600 mg via INTRAVENOUS
  Filled 2024-05-31: qty 12

## 2024-05-31 MED ORDER — SODIUM CHLORIDE 0.9 % IV SOLN
1920.0000 mg/m2 | INTRAVENOUS | Status: DC
  Administered 2024-05-31: 3500 mg via INTRAVENOUS
  Filled 2024-05-31: qty 70

## 2024-05-31 MED ORDER — SODIUM CHLORIDE 0.9 % IV SOLN: INTRAVENOUS | Status: DC

## 2024-05-31 MED ORDER — SODIUM CHLORIDE 0.9 % IV SOLN
320.0000 mg/m2 | Freq: Once | INTRAVENOUS | Status: AC
  Administered 2024-05-31: 608 mg via INTRAVENOUS
  Filled 2024-05-31: qty 30.4

## 2024-05-31 MED ORDER — ATROPINE SULFATE 1 MG/ML IV SOLN
0.5000 mg | Freq: Once | INTRAVENOUS | Status: AC
  Administered 2024-05-31: 0.5 mg via INTRAVENOUS
  Filled 2024-05-31: qty 1

## 2024-05-31 MED ORDER — DEXAMETHASONE SOD PHOSPHATE PF 10 MG/ML IJ SOLN
10.0000 mg | Freq: Once | INTRAMUSCULAR | Status: AC
  Administered 2024-05-31: 10 mg via INTRAVENOUS

## 2024-05-31 MED ORDER — SODIUM CHLORIDE 0.9 % IV SOLN
144.0000 mg/m2 | Freq: Once | INTRAVENOUS | Status: AC
  Administered 2024-05-31: 260 mg via INTRAVENOUS
  Filled 2024-05-31: qty 13

## 2024-05-31 MED ORDER — PALONOSETRON HCL INJECTION 0.25 MG/5ML
0.2500 mg | Freq: Once | INTRAVENOUS | Status: AC
  Administered 2024-05-31: 0.25 mg via INTRAVENOUS
  Filled 2024-05-31: qty 5

## 2024-05-31 MED ORDER — SODIUM CHLORIDE 0.9 % IV SOLN
5.0000 mg/kg | Freq: Once | INTRAVENOUS | Status: AC
  Administered 2024-05-31: 350 mg via INTRAVENOUS
  Filled 2024-05-31: qty 14

## 2024-05-31 NOTE — Progress Notes (Signed)
Treatment given per orders. Patient tolerated it well without problems. Vitals stable and discharged home from clinic ambulatory. Follow up as scheduled.  

## 2024-05-31 NOTE — Patient Instructions (Signed)
 [facility] [place] - A DEPT OF [facility]  Discharge Instructions: Thank you for choosing [facility] to provide your oncology and hematology care.  If you have a lab appointment with the Cancer Center - please note that after April 8th, 2024, all labs will be drawn in the cancer center.  You do not have to check in or register with the main entrance as you have in the past but will complete your check-in in the cancer center.  Wear comfortable clothing and clothing appropriate for easy access to any Portacath or PICC line.   We strive to give you quality time with your provider. You may need to reschedule your appointment if you arrive late (15 or more minutes).  Arriving late affects you and other patients whose appointments are after yours.  Also, if you miss three or more appointments without notifying the office, you may be dismissed from the clinic at the provider's discretion.      For prescription refill requests, have your pharmacy contact our office and allow 72 hours for refills to be completed.    Today you received the following chemotherapy and/or immunotherapy agents bevacizumab , leucovorin , irinotecan    To help prevent nausea and vomiting after your treatment, we encourage you to take your nausea medication as directed.  BELOW ARE SYMPTOMS THAT SHOULD BE REPORTED IMMEDIATELY: *FEVER GREATER THAN 100.4 F (38 C) OR HIGHER *CHILLS OR SWEATING *NAUSEA AND VOMITING THAT IS NOT CONTROLLED WITH YOUR NAUSEA MEDICATION *UNUSUAL SHORTNESS OF BREATH *UNUSUAL BRUISING OR BLEEDING *URINARY PROBLEMS (pain or burning when urinating, or frequent urination) *BOWEL PROBLEMS (unusual diarrhea, constipation, pain near the anus) TENDERNESS IN MOUTH AND THROAT WITH OR WITHOUT PRESENCE OF ULCERS (sore throat, sores in mouth, or a toothache) UNUSUAL RASH, SWELLING OR PAIN  UNUSUAL VAGINAL DISCHARGE OR ITCHING   Items with * indicate a potential emergency  and should be followed up as soon as possible or go to the Emergency Department if any problems should occur.  Please show the CHEMOTHERAPY ALERT CARD or IMMUNOTHERAPY ALERT CARD at check-in to the Emergency Department and triage nurse.  Should you have questions after your visit or need to cancel or reschedule your appointment, please contact [facility] [place] - A DEPT OF [facility] [phone]  and follow the prompts.  Office hours are 8:00 a.m. to 4:30 p.m. Monday - Friday. Please note that voicemails left after 4:00 p.m. may not be returned until the following business day.  We are closed weekends and major holidays. You have access to a nurse at all times for urgent questions. Please call the main number to the clinic [phone] and follow the prompts.  For any non-urgent questions, you may also contact your provider using MyChart. We now offer e-Visits for anyone 50 and older to request care online for non-urgent symptoms. For details visit [email or web address].   Also download the MyChart app! Go to the app store, search MyChart, open the app, select , and log in with your MyChart username and password.

## 2024-06-01 ENCOUNTER — Encounter: Payer: Self-pay | Admitting: Oncology

## 2024-06-01 ENCOUNTER — Encounter: Payer: Self-pay | Admitting: Radiation Oncology

## 2024-06-01 ENCOUNTER — Telehealth: Payer: Self-pay | Admitting: Radiation Oncology

## 2024-06-01 LAB — CEA: CEA: 15.4 ng/mL — ABNORMAL HIGH (ref 0.0–4.7)

## 2024-06-01 NOTE — Telephone Encounter (Signed)
 Called pt to sched CT - confirmed date/time/location - pt req appt reminder via mail - LH

## 2024-06-02 ENCOUNTER — Ambulatory Visit (HOSPITAL_COMMUNITY)

## 2024-06-02 ENCOUNTER — Inpatient Hospital Stay

## 2024-06-02 VITALS — BP 128/72 | HR 94 | Temp 96.7°F | Resp 20

## 2024-06-02 DIAGNOSIS — Z5111 Encounter for antineoplastic chemotherapy: Secondary | ICD-10-CM | POA: Diagnosis not present

## 2024-06-02 DIAGNOSIS — C189 Malignant neoplasm of colon, unspecified: Secondary | ICD-10-CM

## 2024-06-02 MED ORDER — PEGFILGRASTIM-JMDB 6 MG/0.6ML ~~LOC~~ SOSY
6.0000 mg | PREFILLED_SYRINGE | Freq: Once | SUBCUTANEOUS | Status: AC
  Administered 2024-06-02: 6 mg via SUBCUTANEOUS
  Filled 2024-06-02: qty 0.6

## 2024-06-02 NOTE — Patient Instructions (Signed)

## 2024-06-02 NOTE — Progress Notes (Signed)
 Patient tolerated injection with no complaints voiced.  Site clean and dry with no bruising or swelling noted at site.  See MAR for details.  Band aid applied.  Patient stable during and after injection.  Vss with discharge and left in satisfactory condition with no s/s of distress noted.

## 2024-06-07 ENCOUNTER — Ambulatory Visit (HOSPITAL_COMMUNITY)

## 2024-06-07 ENCOUNTER — Ambulatory Visit (HOSPITAL_COMMUNITY)
Admission: RE | Admit: 2024-06-07 | Discharge: 2024-06-07 | Disposition: A | Discharge status: Home / Self Care | Source: Ambulatory Visit | Attending: Oncology | Admitting: Oncology

## 2024-06-07 DIAGNOSIS — C189 Malignant neoplasm of colon, unspecified: Secondary | ICD-10-CM | POA: Diagnosis present

## 2024-06-07 DIAGNOSIS — C787 Secondary malignant neoplasm of liver and intrahepatic bile duct: Secondary | ICD-10-CM | POA: Insufficient documentation

## 2024-06-07 MED ORDER — IOHEXOL 9 MG/ML PO SOLN
ORAL | Status: AC
  Filled 2024-06-07: qty 1000

## 2024-06-07 MED ORDER — IOHEXOL 300 MG/ML  SOLN
100.0000 mL | Freq: Once | INTRAMUSCULAR | Status: AC | PRN
  Administered 2024-06-07: 100 mL via INTRAVENOUS

## 2024-06-07 MED ORDER — IOHEXOL 300 MG/ML  SOLN: 100.0000 mL | Freq: Once | INTRAMUSCULAR | Status: DC | PRN

## 2024-06-12 ENCOUNTER — Other Ambulatory Visit: Payer: Self-pay | Admitting: Oncology

## 2024-06-12 DIAGNOSIS — C189 Malignant neoplasm of colon, unspecified: Secondary | ICD-10-CM

## 2024-06-14 ENCOUNTER — Encounter: Payer: Self-pay | Admitting: Oncology

## 2024-06-14 ENCOUNTER — Inpatient Hospital Stay

## 2024-06-14 ENCOUNTER — Inpatient Hospital Stay: Attending: Hematology

## 2024-06-14 ENCOUNTER — Inpatient Hospital Stay: Admitting: Oncology

## 2024-06-14 DIAGNOSIS — R180 Malignant ascites: Secondary | ICD-10-CM

## 2024-06-14 DIAGNOSIS — Z7189 Other specified counseling: Secondary | ICD-10-CM

## 2024-06-14 DIAGNOSIS — C189 Malignant neoplasm of colon, unspecified: Secondary | ICD-10-CM

## 2024-06-14 DIAGNOSIS — F411 Generalized anxiety disorder: Secondary | ICD-10-CM

## 2024-06-14 DIAGNOSIS — C787 Secondary malignant neoplasm of liver and intrahepatic bile duct: Secondary | ICD-10-CM | POA: Diagnosis not present

## 2024-06-14 DIAGNOSIS — Z5111 Encounter for antineoplastic chemotherapy: Secondary | ICD-10-CM | POA: Diagnosis not present

## 2024-06-14 DIAGNOSIS — D649 Anemia, unspecified: Secondary | ICD-10-CM

## 2024-06-14 LAB — CBC WITH DIFFERENTIAL/PLATELET
Abs Immature Granulocytes: 0.06 K/uL (ref 0.00–0.07)
Basophils Absolute: 0.1 K/uL (ref 0.0–0.1)
Basophils Relative: 1 %
Eosinophils Absolute: 0.2 K/uL (ref 0.0–0.5)
Eosinophils Relative: 2 %
HCT: 34.8 % — ABNORMAL LOW (ref 36.0–46.0)
Hemoglobin: 10.9 g/dL — ABNORMAL LOW (ref 12.0–15.0)
Immature Granulocytes: 1 %
Lymphocytes Relative: 13 %
Lymphs Abs: 1.3 K/uL (ref 0.7–4.0)
MCH: 29.6 pg (ref 26.0–34.0)
MCHC: 31.3 g/dL (ref 30.0–36.0)
MCV: 94.6 fL (ref 80.0–100.0)
Monocytes Absolute: 1 K/uL (ref 0.1–1.0)
Monocytes Relative: 10 %
Neutro Abs: 7.4 K/uL (ref 1.7–7.7)
Neutrophils Relative %: 73 %
Platelets: 98 K/uL — ABNORMAL LOW (ref 150–400)
RBC: 3.68 MIL/uL — ABNORMAL LOW (ref 3.87–5.11)
RDW: 20.4 % — ABNORMAL HIGH (ref 11.5–15.5)
WBC: 10 K/uL (ref 4.0–10.5)
nRBC: 0 % (ref 0.0–0.2)

## 2024-06-14 LAB — URINALYSIS, DIPSTICK ONLY
Bilirubin Urine: NEGATIVE
Glucose, UA: NEGATIVE mg/dL
Hgb urine dipstick: NEGATIVE
Ketones, ur: NEGATIVE mg/dL
Leukocytes,Ua: NEGATIVE
Nitrite: NEGATIVE
Protein, ur: NEGATIVE mg/dL
Specific Gravity, Urine: 1.012 (ref 1.005–1.030)
pH: 6 (ref 5.0–8.0)

## 2024-06-14 LAB — COMPREHENSIVE METABOLIC PANEL WITH GFR
ALT: 25 U/L (ref 0–44)
AST: 49 U/L — ABNORMAL HIGH (ref 15–41)
Albumin: 3.5 g/dL (ref 3.5–5.0)
Alkaline Phosphatase: 254 U/L — ABNORMAL HIGH (ref 38–126)
Anion gap: 14 (ref 5–15)
BUN: 9 mg/dL (ref 8–23)
CO2: 21 mmol/L — ABNORMAL LOW (ref 22–32)
Calcium: 8.4 mg/dL — ABNORMAL LOW (ref 8.9–10.3)
Chloride: 101 mmol/L (ref 98–111)
Creatinine, Ser: 0.74 mg/dL (ref 0.44–1.00)
GFR, Estimated: >60 mL/min (ref >60)
Glucose, Bld: 178 mg/dL — ABNORMAL HIGH (ref 70–99)
Potassium: 3.5 mmol/L (ref 3.5–5.1)
Sodium: 136 mmol/L (ref 135–145)
Total Bilirubin: 1 mg/dL (ref 0.0–1.2)
Total Protein: 6.4 g/dL — ABNORMAL LOW (ref 6.5–8.1)

## 2024-06-14 LAB — MAGNESIUM: Magnesium: 1.9 mg/dL (ref 1.7–2.4)

## 2024-06-14 NOTE — Patient Instructions (Signed)
 [facility] at [facility] Discharge Instructions   You were seen and examined today by Dr. Davonna.  She reviewed the results of your lab work which are normal/stable.   She reviewed the results of your CT scan which shows the cancer has gotten worse.   We will hold your treatment today. We will change your treatment.    Return as scheduled.    Thank you for choosing [facility] at [facility] to provide your oncology and hematology care.  To afford each patient quality time with our provider, please arrive at least 15 minutes before your scheduled appointment time.   If you have a lab appointment with the Cancer Center please come in thru the Main Entrance and check in at the main information desk.  You need to re-schedule your appointment should you arrive 10 or more minutes late.  We strive to give you quality time with our providers, and arriving late affects you and other patients whose appointments are after yours.  Also, if you no show three or more times for appointments you may be dismissed from the clinic at the providers discretion.     Again, thank you for choosing [facility]  Our hope is that these requests will decrease the amount of time that you wait before being seen by our physicians.       _____________________________________________________________  Should you have questions after your visit to [facility], please contact our office at [phone] and follow the prompts.  Our office hours are 8:00 a.m. and 4:30 p.m. Monday - Friday.  Please note that voicemails left after 4:00 p.m. may not be returned until the following business day.  We are closed weekends and major holidays.  You do have access to a nurse 24-7, just call the main number to the clinic [phone] and do not press any options, hold on the line and a nurse will answer the phone.    For prescription refill requests, have your  pharmacy contact our office and allow 72 hours.    Due to Covid, you will need to wear a mask upon entering the hospital. If you do not have a mask, a mask will be given to you at the Main Entrance upon arrival. For doctor visits, patients may have 1 support person age 23 or older with them. For treatment visits, patients can not have anyone with them due to social distancing guidelines and our immunocompromised population.

## 2024-06-14 NOTE — Progress Notes (Signed)
 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 Patient had multiple SBRT to lung lesions . Last one completed on 04/24/2024.   - We reviewed the CT scan findings together.  Patient has extensive pulmonary metastatic disease with innumerable irregular nodules throughout both lungs and some of them are slightly larger and there are several new small pulmonary nodules.  She also has new onset ascites. - Discussed that the current treatment is not recommended anymore due to progression. - Considering patient has KRAS/NRAS wild-type mutation, next line of treatment per standard of care is panitumumab with or without irinotecan  - Discussed risk versus benefits and side effects in detail. -Panitumumab commonly causes an acne-like skin rash, dry or itchy skin, nail changes, and low magnesium levels, with the rash often correlating with treatment response. Irinotecan  frequently causes diarrhea (both early cholinergic and delayed forms), nausea, vomiting, hair loss, and low white blood cell counts that increase infection risk. Both drugs can cause fatigue and  gastrointestinal side effects and require close monitoring to manage skin toxicity, electrolyte abnormalities, and blood counts.  Provided printed information on the medication. -Labs reviewed today: CMP: Normal creatinine, AST:49, ALT:25, Bil:1.0.  Alkaline phosphatase: 254.  CBC: Hemoglobin: 10.9, WBC normal and platelets of 98.   -Will hold chemotherapy today and schedule panitumumab antiemetic and infusions. - Will send prophylactic doxycycline pills and clindamycin gel. - Will repeat a CT scan after 3 cycles.  - Will obtain CEA with every other cycle.   Return to clinic in 1 month with labs for follow-up   Malignant ascites New ascites likely due to metastatic colon cancer progression. The patient is experiencing some symptoms such as shortness of breath with activity but not severe.  - Monitor for symptoms including dyspnea and peripheral edema. - Instructed her to report worsening symptoms for possible paracentesis.  Chemotherapy-induced anemia Mild anemia secondary to chemotherapy. Hemoglobin decreased but not at intervention threshold. - Monitor hemoglobin levels.  Chemotherapy-induced peripheral neuropathy Improvement in neuropathy after increased gabapentin  dosage.  - Continued gabapentin  at increased dose.  300 mg every 8 hours  Generalized anxiety disorder Managed with as-needed Ativan . - Continued current regimen of Ativan  as needed.  Goals of care discussion Discussed poor prognosis with metastatic colon cancer.  DNR status elected in context of incurable metastatic cancer. Declines chest compressions and mechanical ventilation.  - Documented DNR status in medical record.   The patient understands the plans discussed today and is in agreement with them.  She knows to contact our office if she develops concerns prior to her next appointment.  The total time spent in the appointment was 32 minutes for the encounter with patient, including review of chart and various  tests results, discussions about plan of care and coordination of  care plan    Mickiel Dry, MD  [facility] [place] - A DEPT OF [facility] [address] [place] [address] Dept: [phone] Dept Fax: [phone]   No orders of the defined types were placed in this encounter.    ONCOLOGY HISTORY:   I have reviewed her chart and materials related to her cancer extensively and collaborated history with the patient. 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 There is pharyngeal wall thickening within the mid sigmoid colon with surrounding extraluminal gas and fluid collections compatible with abscess. Free air is also noted in the upper abdomen adjacent to the liver. Numerous low-density masses throughout the liver. Appearance is concerning for metastases. -04/29/2020: Sigmoid colectomy and end colostomy Pathology: Adenocarcinoma, moderately differentiated, 3 cm  -  Lymphovascular space and perineural invasion present  -  No carcinoma identified in eight lymph nodes (0/8) .  - MMR preserved. MSI-stable. - pT4a, pN1c  -05/02/2020: CEA : 60.2 -05/02/2020: CT chest: No definite evidence of metastatic disease to the chest with tiny 2 mm RIGHT middle lobe pulmonary nodule.  Numerous low-attenuation lesions in the liver are more compatible with hepatic metastatic disease rather than hepatic abscess. -05/03/2020: Liver biopsy.   Pathology: Adenocarcinoma- Morphology compatible with colorectal carcinoma -05/07/2020: CT AP: Post distal colonic resection with sigmoid colostomy and Hartmann pouch. Previously identified distal sigmoid tumor no longer seen. Suspected hepatic metastases.  -06/04/2020-11/06/2020: FOLFOX and bevacizumab   -05/03/2020: [facility] testing: MSI-stable, KRAS/NRAS wild-type, TMB:  low 64mut/Mb, ARId1A, MAP2K2,TP53,APC: positive -11/20/2020-02/17/2023: 5FU with bevacizumab , discontinued due to progression -02/25/2023: CT CAP: Increased size and conspicuity of the hepatic metastasis. New scattered bilateral pulmonary nodules, concerning for pulmonary metastasis. Increased nodular mesenteric and omental stranding, concerning for peritoneal carcinomatosis.  -03/10/2023-05/24/2023: FOLFOX + bevacizumab  (oxaliplatin  readded), discontinued due to progression -06/02/2023: CT CAP: Numerous bilateral pulmonary nodules, new and enlarged compared to prior examination, consistent with worsened pulmonary metastatic disease. Several very ill-defined liver metastases, some of which are not appreciably changed, others of which appear to have slightly enlarged.  -06/09/2023-06/02/2024: FOLFIRI and bevacizumab  -01/10/2024-02/17/2024: SBRT to lung lesions [facility]) -03/01/2024: CEA:12.8 -03/07/2024: CT CAP:  Innumerable bilateral pulmonary nodules some of which have increased in size and others which are stable. Bilobar hepatic lesions are overall stable from prior examination. New small volume free fluid in the left hemiabdomen/pelvis with similar peritoneal thickening and peritoneal/omental stranding.  - 04/11/2024-04/24/2024: SBRT to the lung lesions  Current Treatment:  Planned panitumumab  and irinotecan   INTERVAL HISTORY:   Discussed the use of AI scribe software for clinical note transcription with the patient, who gave verbal consent to proceed.  History of Present Illness Susan Martin is a 63 year old female with metastatic colon cancer who presents for oncology follow-up due to disease progression and discussion of new systemic treatment options.  She is accompanied by her brother today.  She has stage IV colon cancer with pulmonary metastases, including malignant ascites. Recent imaging demonstrates multiple new pulmonary nodules and new abdominal ascites.   She experiences  significant post-injection discomfort following chemotherapy, describing persistent symptoms lasting up to one week. She denies abdominal pain related to ascites and reports only mild, intermittent swelling. She denies dyspnea at rest but has mild exertional dyspnea with activities such as washing dishes, which resolves with rest. She denies fevers, chills, or sweats.  She has mild, occasional diarrhea managed with as-needed loperamide. Peripheral neuropathy symptoms have improved since her gabapentin  dose was increased at the last visit. She continues anxiolytic therapy with alprazolam  and lorazepam  and remains independent in activities of daily living.  During the visit, she discussed and elected for DNR status, declining chest compressions and mechanical ventilation in the event of cardiac arrest. She had not previously considered end-of-life preferences but agreed to document her wishes given the incurable nature of her malignancy.   I have reviewed the past medical history, past surgical history, social history and family history with the patient and they are unchanged from previous note.  ALLERGIES:  has no known allergies.  MEDICATIONS:  Current Outpatient Medications  Medication Sig Dispense Refill   amLODipine  (NORVASC ) 10 MG tablet Take 1 tablet (10 mg total) by mouth daily. 90 tablet 2   BEVACIZUMAB  IV Inject 5 mg/kg into the vein every 14 (fourteen) days.     blood glucose meter kit and supplies KIT Dispense based on patient and insurance preference. Use up to four times daily as directed. 1 each 6   clonazePAM  (KLONOPIN ) 1 MG tablet TAKE 1 TABLET (1 MG TOTAL) BY MOUTH IN THE MORNING, AT NOON, AND AT BEDTIME. 90 tablet 3   dexamethasone  (DECADRON ) 2 MG tablet Take 1 tablet (2 mg total) by mouth as directed for 20 doses. (Patient not taking: Reported on 05/30/2024) 20 tablet 0   fluorouracil  CALGB 19297 in sodium chloride  0.9 % 150 mL Inject 2,400 mg/m2 into the vein over 48 hr.      gabapentin  (NEURONTIN ) 300 MG capsule TAKE 1 CAPSULE (100 MG TOTAL) BY MOUTH 3 TIMES A DAY 90 capsule 2   glimepiride  (AMARYL ) 1 MG tablet TAKE 1 TABLET BY MOUTH EVERY DAY WITH BREAKFAST 90 tablet 1   Lancets (ONETOUCH DELICA PLUS LANCET33G) MISC Apply topically.     lansoprazole  (PREVACID ) 30 MG capsule Take 1 capsule (30 mg total) by mouth daily at 12 noon. 30 capsule 0   LEUCOVORIN  CALCIUM  IV Inject 400 mg/m2 into the vein every 21 ( twenty-one) days.     lidocaine  (XYLOCAINE ) 2 % solution Use as directed 15 mLs in the mouth or throat every 6 (six) hours as needed for mouth pain. Swish and spit/swallow every six hours as needed for mouth pain 480 mL 3   lidocaine -prilocaine  (EMLA ) cream Apply 1 Application topically as needed. 30 g 0   LORazepam  (ATIVAN ) 0.5 MG tablet Take 1 tablet (0.5 mg total) by mouth 3 (three) times daily. 90 tablet 2   Misc. Devices MISC Please provide with New Image Cera Plus 2 3/4 (2 - piece Ostomy skin barrier flange Ref # [id number]) & New Image  2 3/4 (2 - piece drainable Ostomy pouches Ref # [id number]) 5 Box 12   nystatin (MYCOSTATIN) 100000 UNIT/ML suspension Take by mouth.     ONETOUCH ULTRA test strip Use as instructed 100 each 6   OXALIPLATIN  IV Inject into the vein every 14 (fourteen) days.     oxyCODONE  (OXY IR/ROXICODONE ) 5 MG immediate release tablet Take 5 mg by mouth every 6 (six) hours as needed. for pain     PARoxetine  (PAXIL ) 40 MG tablet Take 1 tablet (40 mg total) by mouth every morning. 90 tablet 3   polyethylene glycol (MIRALAX  / GLYCOLAX ) packet Take 17 g by mouth every other day.     potassium chloride  (KLOR-CON  M10) 10 MEQ tablet Take 2 tablets (20 mEq total) by mouth daily. 180  tablet 3   sucralfate  (CARAFATE ) 1 g tablet Take 1 tablet (1 g total) by mouth 2 (two) times daily. Dissolve tablet in 4 T of warm water  swish and swallow morning and evening. 60 tablet 1   triamterene -hydrochlorothiazide  (MAXZIDE -25) 37.5-25 MG tablet Take 1 tablet by mouth daily.  90 tablet 1   No current facility-administered medications for this visit.    VITALS:  There were no vitals taken for this visit.  Wt Readings from Last 3 Encounters:  05/31/24 153 lb 9.6 oz (69.7 kg)  05/30/24 151 lb (68.5 kg)  05/10/24 150 lb 2.1 oz (68.1 kg)    There is no height or weight on file to calculate BMI.  Performance status (ECOG): 1 - Symptomatic but completely ambulatory  PHYSICAL EXAM:   GENERAL:alert, no distress and comfortable SKIN: skin color, texture, turgor are normal, no rashes or significant lesions LYMPH:  no palpable lymphadenopathy in the cervical, axillary or inguinal LUNGS: clear to auscultation and percussion with normal breathing effort HEART: regular rate & rhythm and no murmurs and no lower extremity edema ABDOMEN:abdomen soft, non-tender and normal bowel sounds, colostomy bag in place Musculoskeletal:no cyanosis of digits and no clubbing  NEURO: alert & oriented x 3 with fluent speech  LABORATORY DATA:  I have reviewed the data as listed   Lab Results  Component Value Date   WBC 6.4 05/31/2024   NEUTROABS 4.3 05/31/2024   HGB 10.4 (L) 05/31/2024   HCT 33.2 (L) 05/31/2024   MCV 96.8 05/31/2024   PLT 106 (L) 05/31/2024      Chemistry      Component Value Date/Time   NA 137 05/31/2024 0838   NA 140 03/05/2016 1201   K 3.8 05/31/2024 0838   CL 103 05/31/2024 0838   CO2 20 (L) 05/31/2024 0838   BUN 8 05/31/2024 0838   BUN 10 03/05/2016 1201   CREATININE 0.71 05/31/2024 0838      Component Value Date/Time   CALCIUM  8.7 (L) 05/31/2024 0838   ALKPHOS 210 (H) 05/31/2024 0838   AST 44 (H) 05/31/2024 0838   ALT 23 05/31/2024 0838   BILITOT 1.2 05/31/2024 0838   BILITOT 0.3 03/05/2016 1201       Latest Reference Range & Units 05/31/24 08:38  CEA 0.0 - 4.7 ng/mL 15.4 (H)  (H): Data is abnormally high   Latest Reference Range & Units 06/14/24 09:06  Appearance CLEAR  HAZY !  Bilirubin Urine NEGATIVE  NEGATIVE  Color, Urine  YELLOW  YELLOW  Glucose, UA NEGATIVE mg/dL NEGATIVE  Hgb urine dipstick NEGATIVE  NEGATIVE  Ketones, ur NEGATIVE mg/dL NEGATIVE  Leukocytes,Ua NEGATIVE  NEGATIVE  Nitrite NEGATIVE  NEGATIVE  pH 5.0 - 8.0  6.0  Protein NEGATIVE mg/dL NEGATIVE  Specific Gravity, Urine 1.005 - 1.030  1.012  !: Data is abnormal   RADIOGRAPHIC STUDIES: I have personally reviewed the radiological images as listed and agreed with the findings in the report.  CT CHEST ABDOMEN PELVIS W CONTRAST CLINICAL DATA:  Restaging metastatic colon cancer. * Tracking Code: BO *  EXAM: CT CHEST, ABDOMEN, AND PELVIS WITH CONTRAST  TECHNIQUE: Multidetector CT imaging of the chest, abdomen and pelvis was performed following the standard protocol during bolus administration of intravenous contrast.  RADIATION DOSE REDUCTION: This exam was performed according to the departmental dose-optimization program which includes automated exposure control, adjustment of the mA and/or kV according to patient size and/or use of iterative reconstruction technique.  CONTRAST:  100mL OMNIPAQUE  IOHEXOL  300  MG/ML  SOLN  COMPARISON:  Numerous prior CT examinations. The most recent is 03/07/2024  FINDINGS: CT CHEST FINDINGS  Cardiovascular: The heart is normal in size. No pericardial effusion. The aorta is normal in caliber. No dissection. No aortic or coronary artery calcifications. The pulmonary arteries are grossly.  Mediastinum/Nodes: No mediastinal or hilar mass or lymphadenopathy. The esophagus is. The thyroid gland is unchanged and within normal limits.  Lungs/Pleura: Again demonstrated is extensive pulmonary metastatic disease with innumerable irregular nodules throughout both lungs. Right upper lobe nodule on image number 33/5 measures 9 mm and previously measured 8 mm.  Left upper lobe nodule on image 39/5 measures 10 mm and previously measured 10 mm.  Superior segment left lower lobe nodule on image 44/5  measures 10 mm and previously measured 7 mm. 7 mm nodule in the left upper lobe just anterior to the major fissure on image 51/5 previously measured 4 mm.  10 mm right lower lobe nodule on image number 76/5 is stable.  13 mm left lower lobe nodule on image 114/5 previously measured 9 mm.  There are several new small pulmonary nodules.  New extensive right lung airspace process with airspace consolidation and air bronchograms in the right upper lobe and right lower lobe. Findings suggest superimposed pneumonia or aspiration. There is also an associated new right pleural effusion  Musculoskeletal: No significant bony findings.  CT ABDOMEN PELVIS FINDINGS  Hepatobiliary: Several small enhancing hepatic lesions are again demonstrated. These are surrounded by a halo of low attenuation. The left hepatic lobe lesion on image 53/2 measures maximum of 13 mm and previously measured 12 mm.  Central segment 8 lesion measures 11 mm and previously measured 11 mm. Segment 6 lesion on image 51/2 measures 18 mm and previously measured 16 mm. Slightly more inferiorly in segment 6 is a 10 mm lesion on image 59/2 which previously measured 10 mm. Do not see any new hepatic lesions. The portal and hepatic veins are patent. Gallbladder is grossly. No common bile duct dilatation.  Pancreas: No mass, inflammation or ductal dilatation.  Spleen: Stable splenomegaly.  Adrenals/Urinary Tract: Adrenal glands and kidneys are unremarkable. No bladder lesions.  Stomach/Bowel: The stomach, duodenum, small bowel and colon are grossly normal. No inflammatory changes or obstructive findings. Surgical changes from a left hemicolectomy with Hartmann's pouch and left abdominal colostomy. Stable parastomal hernia containing fat and vessels.  Vascular/Lymphatic: Stable atherosclerotic calcifications involving the aorta and branch vessels but no aneurysm or dissection. No mesenteric or retroperitoneal  lymphadenopathy. Small scattered lymph nodes are stable. No pelvic or inguinal adenopathy.  Reproductive: Surgically absent.  Other: New moderate abdominal/pelvic ascites. I do not see any discrete enhancing peritoneal surface lesions or omental lesions. There is an ill-defined area of soft tissue density chest anterior to the rectosigmoid region just below the Hartmann's pouch. This measures approximately 2.9 x 1.4 cm and in retrospect is stable. It may just be part of the vaginal cuff or postsurgical change. Paracentesis may be helpful for diagnostic purposes.  Musculoskeletal: No significant bony findings. No lytic or sclerotic bone lesions.  IMPRESSION: 1. New extensive right lung airspace process with airspace consolidation and air bronchograms in the right upper lobe and right lower lobe. Findings suggest superimposed pneumonia or aspiration. There is also an associated new right pleural effusion. 2. Extensive pulmonary metastatic disease with innumerable irregular nodules throughout both lungs. Some of the nodules are slightly larger and there are several new small pulmonary nodules. 3. Stable hepatic metastatic disease. 4.  New moderate abdominal/pelvic ascites. I do not see any discrete enhancing peritoneal surface lesions or omental lesions. There is an ill-defined area of soft tissue density anterior to the rectosigmoid region just below the Hartmann's pouch. This may just be part of the vaginal cuff or postsurgical change. Paracentesis may be helpful for diagnostic purposes. 5. Stable splenomegaly. 6. Aortic atherosclerosis.  Aortic Atherosclerosis (ICD10-I70.0).  Electronically Signed   By: MYRTIS Stammer M.D.   On: 06/13/2024 11:52

## 2024-06-14 NOTE — Telephone Encounter (Signed)
 Please advise

## 2024-06-14 NOTE — Progress Notes (Signed)
No treatment today due to progression.

## 2024-06-14 NOTE — Progress Notes (Signed)
 DISCONTINUE ON PATHWAY REGIMEN - Colorectal     A cycle is every 14 days:     Bevacizumab -xxxx      Irinotecan       Leucovorin       Fluorouracil       Fluorouracil    **Always confirm dose/schedule in your pharmacy ordering system**  PRIOR TREATMENT: [id number]: FOLFIRI + Bevacizumab  q14 Days  START ON PATHWAY REGIMEN - Colorectal     A cycle is every 14 days:     Panitumumab      Irinotecan    **Always confirm dose/schedule in your pharmacy ordering system**  Patient Characteristics: Distant Metastases, Nonsurgical Candidate, KRAS/NRAS Wild-Type (BRAF V600 Wild-Type/Unknown), Standard Cytotoxic Therapy, Third Line Standard Cytotoxic Therapy, No Prior Anti-EGFR Therapy Therapeutic Status: Distant Metastases Tumor Location: Colon BRAF Mutation Status: Wild-Type (no mutation) KRAS/NRAS Mutation Status: Wild-Type (no mutation) Microsatellite/Mismatch Repair Status: MSS/pMMR Preferred Therapy Approach: Standard Cytotoxic Therapy Standard Cytotoxic Line of Therapy: Third Careers Adviser Cytotoxic Therapy Intent of Therapy: Non-Curative / Palliative Intent, Discussed with Patient

## 2024-06-15 LAB — CEA: CEA: 16.1 ng/mL — ABNORMAL HIGH (ref 0.0–4.7)

## 2024-06-16 ENCOUNTER — Other Ambulatory Visit: Payer: Self-pay

## 2024-06-16 ENCOUNTER — Inpatient Hospital Stay

## 2024-06-16 ENCOUNTER — Other Ambulatory Visit: Payer: Self-pay | Admitting: Oncology

## 2024-06-16 VITALS — BP 119/68 | HR 85 | Temp 97.4°F | Resp 18

## 2024-06-16 DIAGNOSIS — C189 Malignant neoplasm of colon, unspecified: Secondary | ICD-10-CM

## 2024-06-16 DIAGNOSIS — Z5111 Encounter for antineoplastic chemotherapy: Secondary | ICD-10-CM | POA: Diagnosis not present

## 2024-06-16 MED ORDER — DEXAMETHASONE SOD PHOSPHATE PF 10 MG/ML IJ SOLN
10.0000 mg | Freq: Once | INTRAMUSCULAR | Status: AC
  Administered 2024-06-16: 10 mg via INTRAVENOUS

## 2024-06-16 MED ORDER — DEXAMETHASONE 4 MG PO TABS: 8.0000 mg | ORAL_TABLET | Freq: Every day | ORAL | 5 refills | Status: DC

## 2024-06-16 MED ORDER — ONDANSETRON HCL 8 MG PO TABS: 8.0000 mg | ORAL_TABLET | Freq: Three times a day (TID) | ORAL | 1 refills | Status: AC | PRN

## 2024-06-16 MED ORDER — ATROPINE SULFATE 1 MG/ML IV SOLN
0.5000 mg | Freq: Once | INTRAVENOUS | Status: AC
  Administered 2024-06-16: 0.5 mg via INTRAVENOUS
  Filled 2024-06-16: qty 1

## 2024-06-16 MED ORDER — LORAZEPAM 1 MG PO TABS: 1.0000 mg | ORAL_TABLET | Freq: Three times a day (TID) | ORAL | 2 refills | Status: AC

## 2024-06-16 MED ORDER — SODIUM CHLORIDE 0.9 % IV SOLN
6.0000 mg/kg | Freq: Once | INTRAVENOUS | Status: AC
  Administered 2024-06-16: 400 mg via INTRAVENOUS
  Filled 2024-06-16: qty 20

## 2024-06-16 MED ORDER — PROCHLORPERAZINE MALEATE 10 MG PO TABS: 10.0000 mg | ORAL_TABLET | Freq: Four times a day (QID) | ORAL | 1 refills | Status: AC | PRN

## 2024-06-16 MED ORDER — LIDOCAINE-PRILOCAINE 2.5-2.5 % EX CREA: TOPICAL_CREAM | CUTANEOUS | 3 refills | Status: AC

## 2024-06-16 MED ORDER — SODIUM CHLORIDE 0.9 % IV SOLN: INTRAVENOUS | Status: DC

## 2024-06-16 MED ORDER — SODIUM CHLORIDE 0.9 % IV SOLN
180.0000 mg/m2 | Freq: Once | INTRAVENOUS | Status: AC
  Administered 2024-06-16: 300 mg via INTRAVENOUS
  Filled 2024-06-16: qty 15

## 2024-06-16 MED ORDER — PALONOSETRON HCL INJECTION 0.25 MG/5ML
0.2500 mg | Freq: Once | INTRAVENOUS | Status: AC
  Administered 2024-06-16: 0.25 mg via INTRAVENOUS
  Filled 2024-06-16: qty 5

## 2024-06-16 MED ORDER — DOXYCYCLINE HYCLATE 100 MG PO TABS: 100.0000 mg | ORAL_TABLET | Freq: Two times a day (BID) | ORAL | 2 refills | Status: AC

## 2024-06-16 MED ORDER — CLINDAMYCIN PHOS (TWICE-DAILY) 1 % EX GEL: Freq: Two times a day (BID) | CUTANEOUS | 0 refills | Status: DC

## 2024-06-16 MED ORDER — LOPERAMIDE HCL 2 MG PO CAPS: ORAL_CAPSULE | ORAL | 3 refills | Status: AC

## 2024-06-16 NOTE — Patient Instructions (Signed)
 [facility] [place] - A DEPT OF [place]  Discharge Instructions: Thank you for choosing [facility] to provide your oncology and hematology care.  If you have a lab appointment with the Cancer Center - please note that after April 8th, 2024, all labs will be drawn in the cancer center.  You do not have to check in or register with the main entrance as you have in the past but will complete your check-in in the cancer center.  Wear comfortable clothing and clothing appropriate for easy access to any Portacath or PICC line.   We strive to give you quality time with your provider. You may need to reschedule your appointment if you arrive late (15 or more minutes).  Arriving late affects you and other patients whose appointments are after yours.  Also, if you miss three or more appointments without notifying the office, you may be dismissed from the clinic at the providers discretion.      For prescription refill requests, have your pharmacy contact our office and allow 72 hours for refills to be completed.    Today you received the following chemotherapy and/or immunotherapy agents irinotecan  and Panitumumab.     To help prevent nausea and vomiting after your treatment, we encourage you to take your nausea medication as directed.  BELOW ARE SYMPTOMS THAT SHOULD BE REPORTED IMMEDIATELY: *FEVER GREATER THAN 100.4 F (38 C) OR HIGHER *CHILLS OR SWEATING *NAUSEA AND VOMITING THAT IS NOT CONTROLLED WITH YOUR NAUSEA MEDICATION *UNUSUAL SHORTNESS OF BREATH *UNUSUAL BRUISING OR BLEEDING *URINARY PROBLEMS (pain or burning when urinating, or frequent urination) *BOWEL PROBLEMS (unusual diarrhea, constipation, pain near the anus) TENDERNESS IN MOUTH AND THROAT WITH OR WITHOUT PRESENCE OF ULCERS (sore throat, sores in mouth, or a toothache) UNUSUAL RASH, SWELLING OR PAIN  UNUSUAL VAGINAL DISCHARGE OR ITCHING   Items with * indicate a potential emergency and  should be followed up as soon as possible or go to the Emergency Department if any problems should occur.  Please show the CHEMOTHERAPY ALERT CARD or IMMUNOTHERAPY ALERT CARD at check-in to the Emergency Department and triage nurse.  Should you have questions after your visit or need to cancel or reschedule your appointment, please contact [facility] [place] - A DEPT OF [facility] [phone]  and follow the prompts.  Office hours are 8:00 a.m. to 4:30 p.m. Monday - Friday. Please note that voicemails left after 4:00 p.m. may not be returned until the following business day.  We are closed weekends and major holidays. You have access to a nurse at all times for urgent questions. Please call the main number to the clinic [phone] and follow the prompts.  For any non-urgent questions, you may also contact your provider using MyChart. We now offer e-Visits for anyone 63 and older to request care online for non-urgent symptoms. For details visit [email or web address].   Also download the MyChart app! Go to the app store, search MyChart, open the app, select , and log in with your MyChart username and password.

## 2024-06-16 NOTE — Progress Notes (Addendum)
 Pharmacist Chemotherapy Monitoring - Initial Assessment    Anticipated start date: 06/16/24   The following has been reviewed per standard work regarding the patient's treatment regimen: The patient's diagnosis, treatment plan and drug doses, and organ/hematologic function Lab orders and baseline tests specific to treatment regimen  The treatment plan start date, drug sequencing, and pre-medications Prior authorization status  Patient's documented medication list, including drug-drug interaction screen and prescriptions for anti-emetics and supportive care specific to the treatment regimen The drug concentrations, fluid compatibility, administration routes, and timing of the medications to be used The patient's access for treatment and lifetime cumulative dose history, if applicable  The patient's medication allergies and previous infusion related reactions, if applicable   Changes made to treatment plan:  treatment plan date  Follow up needed:  N/A   Susan Martin, [facility], 06/16/2024  9:27 AM

## 2024-06-16 NOTE — Progress Notes (Signed)
 Treatment given per orders. Patient tolerated it well without problems. Vitals stable and discharged home from clinic ambulatory. Follow up as scheduled.   Education done with pt today: side effects of Vectibix regarding rash, went over instructions to start taking Doxycycline BID and foods to avoid with that , specific instructions on how to take it.   Gave instructions on if and when she develops a rash or any skin irritations to start using the clindamycin gel to the affected areas.   Gave handouts on side of effects of chemo given today and to call us  if she has any questions and concerns. A nurse will call and follow up on Monday to see how pt is doing.   Patient understood all instructions and has no further questions at this time.

## 2024-06-16 NOTE — Progress Notes (Signed)
 Received OK to proceed treat with platelets 98k from Dr Davonna. Using labs from 06/14/24.  Niels Molt, PharmD

## 2024-06-26 ENCOUNTER — Encounter: Payer: Self-pay | Admitting: *Deleted

## 2024-06-28 ENCOUNTER — Inpatient Hospital Stay

## 2024-06-30 ENCOUNTER — Inpatient Hospital Stay

## 2024-06-30 ENCOUNTER — Inpatient Hospital Stay: Attending: Hematology

## 2024-06-30 ENCOUNTER — Other Ambulatory Visit: Payer: Self-pay | Admitting: Oncology

## 2024-06-30 DIAGNOSIS — Z7952 Long term (current) use of systemic steroids: Secondary | ICD-10-CM | POA: Insufficient documentation

## 2024-06-30 DIAGNOSIS — Z7984 Long term (current) use of oral hypoglycemic drugs: Secondary | ICD-10-CM | POA: Diagnosis not present

## 2024-06-30 DIAGNOSIS — Z5111 Encounter for antineoplastic chemotherapy: Secondary | ICD-10-CM | POA: Insufficient documentation

## 2024-06-30 DIAGNOSIS — R180 Malignant ascites: Secondary | ICD-10-CM | POA: Diagnosis not present

## 2024-06-30 DIAGNOSIS — C7800 Secondary malignant neoplasm of unspecified lung: Secondary | ICD-10-CM | POA: Diagnosis not present

## 2024-06-30 DIAGNOSIS — Z79620 Long term (current) use of immunosuppressive biologic: Secondary | ICD-10-CM | POA: Insufficient documentation

## 2024-06-30 DIAGNOSIS — D696 Thrombocytopenia, unspecified: Secondary | ICD-10-CM | POA: Insufficient documentation

## 2024-06-30 DIAGNOSIS — C787 Secondary malignant neoplasm of liver and intrahepatic bile duct: Secondary | ICD-10-CM | POA: Insufficient documentation

## 2024-06-30 DIAGNOSIS — C189 Malignant neoplasm of colon, unspecified: Secondary | ICD-10-CM | POA: Diagnosis not present

## 2024-06-30 DIAGNOSIS — E876 Hypokalemia: Secondary | ICD-10-CM

## 2024-06-30 DIAGNOSIS — Z79899 Other long term (current) drug therapy: Secondary | ICD-10-CM | POA: Insufficient documentation

## 2024-06-30 DIAGNOSIS — G620 Drug-induced polyneuropathy: Secondary | ICD-10-CM | POA: Insufficient documentation

## 2024-06-30 DIAGNOSIS — Z5112 Encounter for antineoplastic immunotherapy: Secondary | ICD-10-CM | POA: Diagnosis present

## 2024-06-30 DIAGNOSIS — Z79634 Long term (current) use of topoisomerase inhibitor: Secondary | ICD-10-CM | POA: Insufficient documentation

## 2024-06-30 DIAGNOSIS — T451X5A Adverse effect of antineoplastic and immunosuppressive drugs, initial encounter: Secondary | ICD-10-CM | POA: Insufficient documentation

## 2024-06-30 DIAGNOSIS — R0602 Shortness of breath: Secondary | ICD-10-CM | POA: Insufficient documentation

## 2024-06-30 DIAGNOSIS — R21 Rash and other nonspecific skin eruption: Secondary | ICD-10-CM | POA: Insufficient documentation

## 2024-06-30 LAB — COMPREHENSIVE METABOLIC PANEL WITH GFR
ALT: 37 U/L (ref 0–44)
AST: 37 U/L (ref 15–41)
Albumin: 3.4 g/dL — ABNORMAL LOW (ref 3.5–5.0)
Alkaline Phosphatase: 171 U/L — ABNORMAL HIGH (ref 38–126)
Anion gap: 14 (ref 5–15)
BUN: 12 mg/dL (ref 8–23)
CO2: 22 mmol/L (ref 22–32)
Calcium: 8.4 mg/dL — ABNORMAL LOW (ref 8.9–10.3)
Chloride: 100 mmol/L (ref 98–111)
Creatinine, Ser: 0.6 mg/dL (ref 0.44–1.00)
GFR, Estimated: >60 mL/min
Glucose, Bld: 167 mg/dL — ABNORMAL HIGH (ref 70–99)
Potassium: 3.4 mmol/L — ABNORMAL LOW (ref 3.5–5.1)
Sodium: 136 mmol/L (ref 135–145)
Total Bilirubin: 1.4 mg/dL — ABNORMAL HIGH (ref 0.0–1.2)
Total Protein: 6 g/dL — ABNORMAL LOW (ref 6.5–8.1)

## 2024-06-30 LAB — CBC WITH DIFFERENTIAL/PLATELET
Abs Immature Granulocytes: 0.02 K/uL (ref 0.00–0.07)
Basophils Absolute: 0 K/uL (ref 0.0–0.1)
Basophils Relative: 1 %
Eosinophils Absolute: 0.3 K/uL (ref 0.0–0.5)
Eosinophils Relative: 8 %
HCT: 35.2 % — ABNORMAL LOW (ref 36.0–46.0)
Hemoglobin: 11.2 g/dL — ABNORMAL LOW (ref 12.0–15.0)
Immature Granulocytes: 1 %
Lymphocytes Relative: 18 %
Lymphs Abs: 0.7 K/uL (ref 0.7–4.0)
MCH: 29.5 pg (ref 26.0–34.0)
MCHC: 31.8 g/dL (ref 30.0–36.0)
MCV: 92.6 fL (ref 80.0–100.0)
Monocytes Absolute: 0.7 K/uL (ref 0.1–1.0)
Monocytes Relative: 18 %
Neutro Abs: 2.1 K/uL (ref 1.7–7.7)
Neutrophils Relative %: 54 %
Platelets: 78 K/uL — ABNORMAL LOW (ref 150–400)
RBC: 3.8 MIL/uL — ABNORMAL LOW (ref 3.87–5.11)
RDW: 18.4 % — ABNORMAL HIGH (ref 11.5–15.5)
WBC: 3.8 K/uL — ABNORMAL LOW (ref 4.0–10.5)
nRBC: 0 % (ref 0.0–0.2)

## 2024-06-30 LAB — MAGNESIUM: Magnesium: 1.8 mg/dL (ref 1.7–2.4)

## 2024-06-30 MED ORDER — TRIAMCINOLONE ACETONIDE 0.025 % EX OINT: 1.0000 | TOPICAL_OINTMENT | Freq: Two times a day (BID) | CUTANEOUS | 0 refills | Status: AC

## 2024-06-30 MED ORDER — ALBUTEROL SULFATE HFA 108 (90 BASE) MCG/ACT IN AERS: 2.0000 | INHALATION_SPRAY | Freq: Four times a day (QID) | RESPIRATORY_TRACT | 2 refills | Status: AC | PRN

## 2024-06-30 MED ORDER — POTASSIUM CHLORIDE CRYS ER 20 MEQ PO TBCR
40.0000 meq | EXTENDED_RELEASE_TABLET | Freq: Once | ORAL | Status: AC
  Administered 2024-06-30: 40 meq via ORAL
  Filled 2024-06-30: qty 2

## 2024-06-30 NOTE — Progress Notes (Signed)
 Patient presents today for chemotherapy/immunotherapy infusion of Vectibix  and Irinotecan . Vital signs are stable.  Patient has new onset of rash to face and progression in wheezing. Delon Hope NP made aware and is to assess patient. Prescription for steroid cream and inhaler sent to pharmacy. Labs reviewed, platelets 78. Delon Hope NP aware. Per Delon Hope NP treatment to be held today. Patient's potassium also noted to be 3.4. Kdur 40mEq po to be given per order.    Patients port flushed without difficulty.  Good blood return noted with no bruising or swelling noted at site. Gauze dressing applied. Patient  left in satisfactory condition with no s/s of distress noted.

## 2024-06-30 NOTE — Progress Notes (Signed)
 Re: facial rash, wheezing and thrombocytopenia  Called back to infusion for new onset facial rash and wheezing with inspiration.  Patient here for second dose of chemo for metastatic colon cancer with mets to the liver.  Reports new onset facial rash that is not itchy since her last infusion.  She has tried OTC Vaseline and was planning on picking up other cream.  We discussed using Aquaphor as well as a low potency steroid cream.  She will let us  know if symptoms do not improve prior to next infusion.  Reviewed labs which showed low platelet count so we decided to hold off on treatment for today.  She was also wheezing with inspiration.  Patient said she does not have a fever and does not feel poorly.  We discussed trying albuterol inhaler which she has used in the past and this worked well.  New prescription sent to pharmacy.  Patient will let us  know if she develops a fever, cough or any other concerning symptom.  Delon Hope, AGNP-C Department of Hematology/Oncology [facility] at [facility]  Phone: [phone]  06/30/2024 11:20 AM

## 2024-06-30 NOTE — Patient Instructions (Signed)
 [facility] [place] - A DEPT OF [facility]  Discharge Instructions: Thank you for choosing [facility] to provide your oncology and hematology care.  If you have a lab appointment with the Cancer Center - please note that after April 8th, 2024, all labs will be drawn in the cancer center.  You do not have to check in or register with the main entrance as you have in the past but will complete your check-in in the cancer center.  Wear comfortable clothing and clothing appropriate for easy access to any Portacath or PICC line.   We strive to give you quality time with your provider. You may need to reschedule your appointment if you arrive late (15 or more minutes).  Arriving late affects you and other patients whose appointments are after yours.  Also, if you miss three or more appointments without notifying the office, you may be dismissed from the clinic at the providers discretion.      For prescription refill requests, have your pharmacy contact our office and allow 72 hours for refills to be completed.    No treatment today due to low platelet count, new onset of rash to face, and wheezing.   Used Prescribed medication of inhaler and steroid cream. May also use Aquaphor lotion.  To help prevent nausea and vomiting after your treatment, we encourage you to take your nausea medication as directed.  BELOW ARE SYMPTOMS THAT SHOULD BE REPORTED IMMEDIATELY: *FEVER GREATER THAN 100.4 F (38 C) OR HIGHER *CHILLS OR SWEATING *NAUSEA AND VOMITING THAT IS NOT CONTROLLED WITH YOUR NAUSEA MEDICATION *UNUSUAL SHORTNESS OF BREATH *UNUSUAL BRUISING OR BLEEDING *URINARY PROBLEMS (pain or burning when urinating, or frequent urination) *BOWEL PROBLEMS (unusual diarrhea, constipation, pain near the anus) TENDERNESS IN MOUTH AND THROAT WITH OR WITHOUT PRESENCE OF ULCERS (sore throat, sores in mouth, or a toothache) UNUSUAL RASH, SWELLING OR PAIN  UNUSUAL VAGINAL  DISCHARGE OR ITCHING   Items with * indicate a potential emergency and should be followed up as soon as possible or go to the Emergency Department if any problems should occur.  Please show the CHEMOTHERAPY ALERT CARD or IMMUNOTHERAPY ALERT CARD at check-in to the Emergency Department and triage nurse.  Should you have questions after your visit or need to cancel or reschedule your appointment, please contact [facility] [place] - A DEPT OF [facility] [phone]  and follow the prompts.  Office hours are 8:00 a.m. to 4:30 p.m. Monday - Friday. Please note that voicemails left after 4:00 p.m. may not be returned until the following business day.  We are closed weekends and major holidays. You have access to a nurse at all times for urgent questions. Please call the main number to the clinic [phone] and follow the prompts.  For any non-urgent questions, you may also contact your provider using MyChart. We now offer e-Visits for anyone 89 and older to request care online for non-urgent symptoms. For details visit [email or web address].   Also download the MyChart app! Go to the app store, search MyChart, open the app, select , and log in with your MyChart username and password.

## 2024-07-01 LAB — CEA: CEA: 14.2 ng/mL — ABNORMAL HIGH (ref 0.0–4.7)

## 2024-07-04 ENCOUNTER — Other Ambulatory Visit: Payer: Self-pay

## 2024-07-04 ENCOUNTER — Ambulatory Visit: Payer: Self-pay

## 2024-07-10 ENCOUNTER — Other Ambulatory Visit: Payer: Self-pay | Admitting: *Deleted

## 2024-07-10 ENCOUNTER — Other Ambulatory Visit

## 2024-07-10 DIAGNOSIS — C787 Secondary malignant neoplasm of liver and intrahepatic bile duct: Secondary | ICD-10-CM

## 2024-07-10 DIAGNOSIS — E876 Hypokalemia: Secondary | ICD-10-CM

## 2024-07-10 DIAGNOSIS — E119 Type 2 diabetes mellitus without complications: Secondary | ICD-10-CM

## 2024-07-10 MED ORDER — GLIMEPIRIDE 1 MG PO TABS: 1.0000 mg | ORAL_TABLET | Freq: Every day | ORAL | 1 refills | Status: AC

## 2024-07-10 MED ORDER — POTASSIUM CHLORIDE CRYS ER 10 MEQ PO TBCR: 20.0000 meq | EXTENDED_RELEASE_TABLET | Freq: Every day | ORAL | 3 refills | Status: AC

## 2024-07-12 NOTE — Progress Notes (Signed)
 " Patient Care Team: Rogers Hai, MD (Inactive) as PCP - General (Hematology) Celestia Joesph SQUIBB, RN as Oncology Nurse Navigator (Oncology) Lenn Aran, MD as Consulting Physician (Radiation Oncology)  Clinic Day:  07/13/2024  Referring physician: Davonna Siad, MD   CHIEF COMPLAINT:  CC: Metastatic colon adenocarcinoma to liver    ASSESSMENT & PLAN:   Assessment & Plan: Susan Martin  is a 64 y.o. female with metastatic colon adenocarcinoma to liver   Assessment and Plan  Metastatic colon carcinoma to the liver Metastatic colon carcinoma, metastatic to liver and lungs initially diagnosed in 2021 s/p hemicolectomy (due to perforation) and treatment with first-line FOLFOX and bevacizumab  Patient was on maintenance 5-FU and bevacizumab  when she progressed and Oxaliplatin  was restarted 2nd line: Changed to FOLFIRI and bevacizumab  with progression. Patient had multiple SBRT to lung lesions . Last one completed on 04/24/2024. 3rd line: Irinoteacn and panitumumab  started on 06/16/2024   - C2D1 of panitumumab  and Irinotecan  - Held Day 14 of previous cycle for thrombocytopenia and rash. Both significantly improved today. -Labs reviewed today: CMP: Normal creatinine, ALP:188, AST:58, ALT:30, Bil:1.0.  Alkaline phosphatase: 254.  CBC: Hemoglobin: 12.0, WBC normal and platelets of 118.   - Continue prophylactic doxycycline  pills and clindamycin  gel. - Will repeat a CT scan in 3 months to assess for response. Scheduled for 08/21/2024 - Will obtain CEA with every other cycle.   Return to clinic after CT scan to discuss results  Malignant ascites New ascites likely due to metastatic colon cancer progression. The patient is experiencing some symptoms such as shortness of breath with activity but not severe. Improved today  Rash Likely secondary to panitumumab  Using doxycycline  pills and clindamycin  gel Improved from prior  Chemotherapy-induced peripheral  neuropathy Improvement in neuropathy after increased gabapentin  dosage.  - Continued gabapentin  at increased dose.  300 mg every 8 hours  Generalized anxiety disorder Managed with as-needed Ativan .  - Continue current regimen of Ativan  as needed.  Diarrhea Secondary to chemotherapy. Improved now  -Take imodium  as needed   Chemotherapy-induced anemia Resolved today  The patient understands the plans discussed today and is in agreement with them.  She knows to contact our office if she develops concerns prior to her next appointment.  The total time spent in the appointment was 32 minutes for the encounter with patient, including review of chart and various tests results, discussions about plan of care and coordination of care plan   I,Helena R Teague,acting as a scribe for Siad Davonna, MD.,have documented all relevant documentation on the behalf of Siad Davonna, MD,as directed by  Siad Davonna, MD while in the presence of Siad Davonna, MD.  I, Siad Davonna MD, have reviewed the above documentation for accuracy and completeness, and I agree with the above.      Siad Davonna, MD  [facility] [place] - A DEPT OF [facility] [address] [place] [address] Dept: [phone] Dept Fax: [phone]   Orders Placed This Encounter  Procedures   CT CHEST ABDOMEN PELVIS W CONTRAST    Standing Status:   Future    Expected Date:   09/05/2024    Expiration Date:   07/13/2025    If indicated for the ordered procedure, I authorize the administration of contrast media per Radiology protocol:   Yes    Does the patient have a contrast media/X-ray dye allergy?:   No    Preferred imaging location?:   [facility]    If indicated for the ordered procedure, I authorize the administration of oral contrast media per Radiology protocol:   Yes     ONCOLOGY HISTORY:   I have reviewed her chart and materials  related to her cancer extensively and collaborated history with the patient. 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 There is pharyngeal wall thickening within the mid sigmoid colon with surrounding extraluminal gas and fluid collections compatible with abscess. Free air is also noted in the upper abdomen adjacent to the liver. Numerous low-density masses throughout the liver. Appearance is concerning for metastases. -04/29/2020: Sigmoid colectomy and end colostomy Pathology: Adenocarcinoma, moderately differentiated, 3 cm  -  Lymphovascular space and perineural invasion present  -  No carcinoma identified in eight lymph nodes (0/8) .  - MMR preserved. MSI-stable. - pT4a, pN1c  -05/02/2020: CEA : 60.2 -05/02/2020: CT chest: No definite evidence of metastatic disease to the chest with tiny 2 mm RIGHT middle lobe pulmonary nodule.  Numerous low-attenuation lesions in the liver are more compatible with hepatic metastatic disease rather than hepatic abscess. -05/03/2020: Liver biopsy.   Pathology: Adenocarcinoma- Morphology compatible with colorectal carcinoma -05/07/2020: CT AP: Post distal colonic resection with sigmoid colostomy and Hartmann pouch. Previously identified distal sigmoid tumor no longer seen. Suspected hepatic metastases.  -06/04/2020-11/06/2020: FOLFOX and bevacizumab   -05/03/2020: [facility] testing: MSI-stable, KRAS/NRAS wild-type, TMB: low 60mut/Mb, ARId1A, MAP2K2,TP53,APC: positive -11/20/2020-02/17/2023: 5FU with bevacizumab , discontinued due to progression -02/25/2023: CT CAP: Increased size and conspicuity of the hepatic metastasis. New scattered bilateral pulmonary nodules, concerning for pulmonary metastasis. Increased nodular mesenteric and omental stranding, concerning for peritoneal carcinomatosis.  -03/10/2023-05/24/2023: FOLFOX +  bevacizumab  (oxaliplatin  readded), discontinued due to progression -06/02/2023: CT CAP: Numerous bilateral pulmonary nodules, new and enlarged compared to prior examination, consistent with worsened pulmonary metastatic disease. Several very ill-defined liver metastases, some of which are not appreciably changed, others of which appear to have slightly enlarged.  -06/09/2023-06/02/2024: FOLFIRI and bevacizumab  -01/10/2024-02/17/2024: SBRT to lung lesions [facility]) -03/01/2024: CEA:12.8 -03/07/2024: CT CAP:  Innumerable bilateral pulmonary nodules some of which have increased in size and others which are stable. Bilobar hepatic lesions are overall stable from prior examination. New small volume free fluid in the left hemiabdomen/pelvis with similar peritoneal thickening and peritoneal/omental stranding.  - 04/11/2024-04/24/2024: SBRT to the lung lesions -06/13/2024: CT CAP: New extensive right lung airspace process with airspace consolidation and air bronchograms in the right upper lobe and right lower lobe. Findings suggest superimposed pneumonia or aspiration. There is also an associated new right pleural effusion. Extensive pulmonary metastatic disease with innumerable irregular nodules throughout both lungs. Some of the nodules are slightly larger and there are several new small pulmonary nodules. Stable hepatic metastatic disease. New moderate abdominal/pelvic ascites. I do not see any discrete enhancing peritoneal surface lesions or omental lesions. There is an ill-defined area of soft tissue density anterior to the rectosigmoid region just below the Hartmann's pouch. This may just be part of the vaginal cuff or postsurgical change.  - 06/16/2024- Current: Panitumumab  and irinotecan   Current Treatment:  Panitumumab  and Irinotecan   INTERVAL HISTORY:   Yenesis Even is here today for follow-up of colon cancer. She is accompanied by her brother today.   Carinna states her chemotherapy induced rash  is improving with kenalog  cream, clindamycin  gel and doxycyline pills. Her diarrhea has improved after previously having diarrhea daily  after her first cycle of treatment. She otherwise tolerated her first cycle of treatment well. We discussed her labs today, which showed a slight decrease in her CEA. She states she is doing well overall today and her ascites is improving. Her peripheral neuropathy has improved as well with her increased dose of gabapentin . She is taking Ativan  as prescribed.   I have reviewed the past medical history, past surgical history, social history and family history with the patient and they are unchanged from previous note.  ALLERGIES:  has no known allergies.  MEDICATIONS:  Current Outpatient Medications  Medication Sig Dispense Refill   albuterol  (VENTOLIN  HFA) 108 (90 Base) MCG/ACT inhaler Inhale 2 puffs into the lungs every 6 (six) hours as needed for wheezing or shortness of breath. 8 g 2   amLODipine  (NORVASC ) 10 MG tablet Take 1 tablet (10 mg total) by mouth daily. 90 tablet 2   BEVACIZUMAB  IV Inject 5 mg/kg into the vein every 14 (fourteen) days.     blood glucose meter kit and supplies KIT Dispense based on patient and insurance preference. Use up to four times daily as directed. 1 each 6   clindamycin  (CLINDAGEL) 1 % gel APPLY TOPICALLY TWICE A DAY 30 g 0   clonazePAM  (KLONOPIN ) 1 MG tablet TAKE 1 TABLET (1 MG TOTAL) BY MOUTH IN THE MORNING, AT NOON, AND AT BEDTIME. 90 tablet 3   dexamethasone  (DECADRON ) 4 MG tablet Take 2 tablets (8 mg total) by mouth daily. Take 2 pills (8 mg) by mouth daily starting the day after chemotherapy treatments for a total of 3 days. Take only as needed for nausea. 30 tablet 1   doxycycline  (VIBRA -TABS) 100 MG tablet Take 1 tablet (100 mg total) by mouth 2 (two) times daily. 180 tablet 2   fluorouracil  CALGB 19297 in sodium chloride  0.9 % 150 mL Inject 2,400 mg/m2 into the vein over 48 hr.     gabapentin  (NEURONTIN ) 300 MG capsule  TAKE 1 CAPSULE (100 MG TOTAL) BY MOUTH 3 TIMES A DAY 90 capsule 2   glimepiride  (AMARYL ) 1 MG tablet Take 1 tablet (1 mg total) by mouth daily with breakfast. 90 tablet 1   Lancets (ONETOUCH DELICA PLUS LANCET33G) MISC Apply topically.     lansoprazole  (PREVACID ) 30 MG capsule Take 1 capsule (30 mg total) by mouth daily at 12 noon. 30 capsule 0   lidocaine  (XYLOCAINE ) 2 % solution Use as directed 15 mLs in the mouth or throat every 6 (six) hours as needed for mouth pain. Swish and spit/swallow every six hours as needed for mouth pain 480 mL 3   lidocaine -prilocaine  (EMLA ) cream Apply to affected area once 30 g 3   loperamide  (IMODIUM ) 2 MG capsule Take 2 tabs by mouth with first loose stool, then 1 tab with each additional loose stool as needed. Do not exceed 8 tabs in a 24-hour period 60 capsule 3   LORazepam  (ATIVAN ) 1 MG tablet Take 1 tablet (1 mg total) by mouth 3 (three) times daily. 90 tablet 2   Misc. Devices MISC Please provide with New Image Cera Plus 2 3/4 (2 - piece Ostomy skin barrier flange Ref # [id number]) & New Image  2 3/4 (2 - piece drainable Ostomy pouches Ref # [id number]) 5 Box 12   nystatin (MYCOSTATIN) 100000 UNIT/ML suspension Take by mouth.     ondansetron  (ZOFRAN ) 8 MG tablet Take 1 tablet (8 mg total) by mouth every 8 (eight) hours as needed for nausea, vomiting or refractory  nausea / vomiting. Start on the third day after chemotherapy. 30 tablet 1   ONETOUCH ULTRA test strip Use as instructed 100 each 6   oxyCODONE  (OXY IR/ROXICODONE ) 5 MG immediate release tablet Take 5 mg by mouth every 6 (six) hours as needed. for pain     PARoxetine  (PAXIL ) 40 MG tablet Take 1 tablet (40 mg total) by mouth every morning. 90 tablet 3   polyethylene glycol (MIRALAX  / GLYCOLAX ) packet Take 17 g by mouth every other day.     potassium chloride  (KLOR-CON  M10) 10 MEQ tablet Take 2 tablets (20 mEq total) by mouth daily. 180 tablet 3   prochlorperazine  (COMPAZINE ) 10 MG tablet Take 1 tablet (10 mg  total) by mouth every 6 (six) hours as needed for nausea or vomiting. 30 tablet 1   sucralfate  (CARAFATE ) 1 g tablet Take 1 tablet (1 g total) by mouth 2 (two) times daily. Dissolve tablet in 4 T of warm water  swish and swallow morning and evening. 60 tablet 1   triamcinolone  (KENALOG ) 0.025 % ointment Apply 1 Application topically 2 (two) times daily. 30 g 0   triamterene -hydrochlorothiazide  (MAXZIDE -25) 37.5-25 MG tablet Take 1 tablet by mouth daily. 90 tablet 1   No current facility-administered medications for this visit.   Facility-Administered Medications Ordered in Other Visits  Medication Dose Route Frequency Provider Last Rate Last Admin   0.9 %  sodium chloride  infusion   Intravenous Continuous Keyshon Stein, MD 10 mL/hr at 07/13/24 0859 New Bag at 07/13/24 0859   irinotecan  (CAMPTOSAR ) 300 mg in sodium chloride  0.9 % 500 mL chemo infusion  180 mg/m2 (Treatment Plan Recorded) Intravenous Once Shannel Zahm, MD       panitumumab  (VECTIBIX ) 400 mg in sodium chloride  0.9 % 100 mL chemo infusion  6 mg/kg (Treatment Plan Recorded) Intravenous Once Keontae Levingston, MD        VITALS:  There were no vitals taken for this visit.  Wt Readings from Last 3 Encounters:  07/13/24 151 lb 0.2 oz (68.5 kg)  06/30/24 150 lb 12.8 oz (68.4 kg)  06/14/24 156 lb 1.4 oz (70.8 kg)    There is no height or weight on file to calculate BMI.  Performance status (ECOG): 1 - Symptomatic but completely ambulatory  PHYSICAL EXAM:   GENERAL:alert, no distress and comfortable SKIN: skin color, texture, turgor are normal, no rashes or significant lesions LYMPH:  no palpable lymphadenopathy in the cervical, axillary or inguinal LUNGS: clear to auscultation and percussion with normal breathing effort HEART: regular rate & rhythm and no murmurs and no lower extremity edema ABDOMEN:abdomen soft, non-tender and normal bowel sounds, colostomy bag in place Musculoskeletal:no cyanosis of digits and no  clubbing  NEURO: alert & oriented x 3 with fluent speech  LABORATORY DATA:  I have reviewed the data as listed   Lab Results  Component Value Date   WBC 7.2 07/13/2024   NEUTROABS 5.6 07/13/2024   HGB 12.0 07/13/2024   HCT 38.3 07/13/2024   MCV 93.0 07/13/2024   PLT 118 (L) 07/13/2024      Chemistry      Component Value Date/Time   NA 137 07/13/2024 0749   NA 140 03/05/2016 1201   K 3.8 07/13/2024 0749   CL 101 07/13/2024 0749   CO2 23 07/13/2024 0749   BUN 12 07/13/2024 0749   BUN 10 03/05/2016 1201   CREATININE 0.68 07/13/2024 0749      Component Value Date/Time   CALCIUM  9.0 07/13/2024 0749  ALKPHOS 188 (H) 07/13/2024 0749   AST 58 (H) 07/13/2024 0749   ALT 30 07/13/2024 0749   BILITOT 1.0 07/13/2024 0749   BILITOT 0.3 03/05/2016 1201       Latest Reference Range & Units 06/30/24 09:43  CEA 0.0 - 4.7 ng/mL 14.2 (H)  (H): Data is abnormally high   Latest Reference Range & Units 06/14/24 09:06  Appearance CLEAR  HAZY !  Bilirubin Urine NEGATIVE  NEGATIVE  Color, Urine YELLOW  YELLOW  Glucose, UA NEGATIVE mg/dL NEGATIVE  Hgb urine dipstick NEGATIVE  NEGATIVE  Ketones, ur NEGATIVE mg/dL NEGATIVE  Leukocytes,Ua NEGATIVE  NEGATIVE  Nitrite NEGATIVE  NEGATIVE  pH 5.0 - 8.0  6.0  Protein NEGATIVE mg/dL NEGATIVE  Specific Gravity, Urine 1.005 - 1.030  1.012  !: Data is abnormal   RADIOGRAPHIC STUDIES: I have personally reviewed the radiological images as listed and agreed with the findings in the report. "

## 2024-07-13 ENCOUNTER — Inpatient Hospital Stay: Admitting: Oncology

## 2024-07-13 ENCOUNTER — Other Ambulatory Visit: Payer: Self-pay | Admitting: Oncology

## 2024-07-13 ENCOUNTER — Inpatient Hospital Stay

## 2024-07-13 VITALS — BP 151/66 | HR 95 | Temp 96.8°F | Resp 20 | Wt 151.0 lb

## 2024-07-13 VITALS — BP 137/67 | HR 96 | Temp 96.8°F | Resp 20

## 2024-07-13 DIAGNOSIS — R197 Diarrhea, unspecified: Secondary | ICD-10-CM | POA: Diagnosis not present

## 2024-07-13 DIAGNOSIS — R180 Malignant ascites: Secondary | ICD-10-CM

## 2024-07-13 DIAGNOSIS — C787 Secondary malignant neoplasm of liver and intrahepatic bile duct: Secondary | ICD-10-CM

## 2024-07-13 DIAGNOSIS — R21 Rash and other nonspecific skin eruption: Secondary | ICD-10-CM | POA: Diagnosis not present

## 2024-07-13 DIAGNOSIS — G620 Drug-induced polyneuropathy: Secondary | ICD-10-CM | POA: Diagnosis not present

## 2024-07-13 DIAGNOSIS — C189 Malignant neoplasm of colon, unspecified: Secondary | ICD-10-CM | POA: Diagnosis not present

## 2024-07-13 DIAGNOSIS — F411 Generalized anxiety disorder: Secondary | ICD-10-CM

## 2024-07-13 DIAGNOSIS — T451X5A Adverse effect of antineoplastic and immunosuppressive drugs, initial encounter: Secondary | ICD-10-CM

## 2024-07-13 DIAGNOSIS — Z5112 Encounter for antineoplastic immunotherapy: Secondary | ICD-10-CM | POA: Diagnosis not present

## 2024-07-13 LAB — CBC WITH DIFFERENTIAL/PLATELET
Abs Immature Granulocytes: 0.02 K/uL (ref 0.00–0.07)
Basophils Absolute: 0 K/uL (ref 0.0–0.1)
Basophils Relative: 1 %
Eosinophils Absolute: 0.1 K/uL (ref 0.0–0.5)
Eosinophils Relative: 2 %
HCT: 38.3 % (ref 36.0–46.0)
Hemoglobin: 12 g/dL (ref 12.0–15.0)
Immature Granulocytes: 0 %
Lymphocytes Relative: 11 %
Lymphs Abs: 0.8 K/uL (ref 0.7–4.0)
MCH: 29.1 pg (ref 26.0–34.0)
MCHC: 31.3 g/dL (ref 30.0–36.0)
MCV: 93 fL (ref 80.0–100.0)
Monocytes Absolute: 0.7 K/uL (ref 0.1–1.0)
Monocytes Relative: 10 %
Neutro Abs: 5.6 K/uL (ref 1.7–7.7)
Neutrophils Relative %: 76 %
Platelets: 118 K/uL — ABNORMAL LOW (ref 150–400)
RBC: 4.12 MIL/uL (ref 3.87–5.11)
RDW: 18.3 % — ABNORMAL HIGH (ref 11.5–15.5)
WBC: 7.2 K/uL (ref 4.0–10.5)
nRBC: 0 % (ref 0.0–0.2)

## 2024-07-13 LAB — COMPREHENSIVE METABOLIC PANEL WITH GFR
ALT: 30 U/L (ref 0–44)
AST: 58 U/L — ABNORMAL HIGH (ref 15–41)
Albumin: 3.2 g/dL — ABNORMAL LOW (ref 3.5–5.0)
Alkaline Phosphatase: 188 U/L — ABNORMAL HIGH (ref 38–126)
Anion gap: 13 (ref 5–15)
BUN: 12 mg/dL (ref 8–23)
CO2: 23 mmol/L (ref 22–32)
Calcium: 9 mg/dL (ref 8.9–10.3)
Chloride: 101 mmol/L (ref 98–111)
Creatinine, Ser: 0.68 mg/dL (ref 0.44–1.00)
GFR, Estimated: >60 mL/min
Glucose, Bld: 146 mg/dL — ABNORMAL HIGH (ref 70–99)
Potassium: 3.8 mmol/L (ref 3.5–5.1)
Sodium: 137 mmol/L (ref 135–145)
Total Bilirubin: 1 mg/dL (ref 0.0–1.2)
Total Protein: 6.3 g/dL — ABNORMAL LOW (ref 6.5–8.1)

## 2024-07-13 LAB — MAGNESIUM: Magnesium: 1.8 mg/dL (ref 1.7–2.4)

## 2024-07-13 MED ORDER — SODIUM CHLORIDE 0.9 % IV SOLN: INTRAVENOUS | Status: DC

## 2024-07-13 MED ORDER — DEXAMETHASONE SOD PHOSPHATE PF 10 MG/ML IJ SOLN
10.0000 mg | Freq: Once | INTRAMUSCULAR | Status: AC
  Administered 2024-07-13: 10 mg via INTRAVENOUS
  Filled 2024-07-13: qty 1

## 2024-07-13 MED ORDER — PALONOSETRON HCL INJECTION 0.25 MG/5ML
0.2500 mg | Freq: Once | INTRAVENOUS | Status: AC
  Administered 2024-07-13: 0.25 mg via INTRAVENOUS
  Filled 2024-07-13: qty 5

## 2024-07-13 MED ORDER — SODIUM CHLORIDE 0.9 % IV SOLN
6.0000 mg/kg | Freq: Once | INTRAVENOUS | Status: AC
  Administered 2024-07-13: 400 mg via INTRAVENOUS
  Filled 2024-07-13: qty 20

## 2024-07-13 MED ORDER — DEXAMETHASONE 4 MG PO TABS: 8.0000 mg | ORAL_TABLET | Freq: Every day | ORAL | 1 refills | Status: AC

## 2024-07-13 MED ORDER — SODIUM CHLORIDE 0.9 % IV SOLN
180.0000 mg/m2 | Freq: Once | INTRAVENOUS | Status: AC
  Administered 2024-07-13: 300 mg via INTRAVENOUS
  Filled 2024-07-13: qty 15

## 2024-07-13 NOTE — Patient Instructions (Signed)
 [facility] [place] - A DEPT OF [facility]  Discharge Instructions: Thank you for choosing [facility] to provide your oncology and hematology care.  If you have a lab appointment with the Cancer Center - please note that after April 8th, 2024, all labs will be drawn in the cancer center.  You do not have to check in or register with the main entrance as you have in the past but will complete your check-in in the cancer center.  Wear comfortable clothing and clothing appropriate for easy access to any Portacath or PICC line.   We strive to give you quality time with your provider. You may need to reschedule your appointment if you arrive late (15 or more minutes).  Arriving late affects you and other patients whose appointments are after yours.  Also, if you miss three or more appointments without notifying the office, you may be dismissed from the clinic at the providers discretion.      For prescription refill requests, have your pharmacy contact our office and allow 72 hours for refills to be completed.    Today you received the following chemotherapy and/or immunotherapy agents Panitumumab , irinotecan    To help prevent nausea and vomiting after your treatment, we encourage you to take your nausea medication as directed.  BELOW ARE SYMPTOMS THAT SHOULD BE REPORTED IMMEDIATELY: *FEVER GREATER THAN 100.4 F (38 C) OR HIGHER *CHILLS OR SWEATING *NAUSEA AND VOMITING THAT IS NOT CONTROLLED WITH YOUR NAUSEA MEDICATION *UNUSUAL SHORTNESS OF BREATH *UNUSUAL BRUISING OR BLEEDING *URINARY PROBLEMS (pain or burning when urinating, or frequent urination) *BOWEL PROBLEMS (unusual diarrhea, constipation, pain near the anus) TENDERNESS IN MOUTH AND THROAT WITH OR WITHOUT PRESENCE OF ULCERS (sore throat, sores in mouth, or a toothache) UNUSUAL RASH, SWELLING OR PAIN  UNUSUAL VAGINAL DISCHARGE OR ITCHING   Items with * indicate a potential emergency and should be  followed up as soon as possible or go to the Emergency Department if any problems should occur.  Please show the CHEMOTHERAPY ALERT CARD or IMMUNOTHERAPY ALERT CARD at check-in to the Emergency Department and triage nurse.  Should you have questions after your visit or need to cancel or reschedule your appointment, please contact [facility] [place] - A DEPT OF [facility] [phone]  and follow the prompts.  Office hours are 8:00 a.m. to 4:30 p.m. Monday - Friday. Please note that voicemails left after 4:00 p.m. may not be returned until the following business day.  We are closed weekends and major holidays. You have access to a nurse at all times for urgent questions. Please call the main number to the clinic [phone] and follow the prompts.  For any non-urgent questions, you may also contact your provider using MyChart. We now offer e-Visits for anyone 53 and older to request care online for non-urgent symptoms. For details visit [email or web address].   Also download the MyChart app! Go to the app store, search MyChart, open the app, select [place], and log in with your MyChart username and password.

## 2024-07-13 NOTE — Progress Notes (Signed)
 Patient has been examined by Dr. Davonna. Vital signs and labs have been reviewed by MD - ANC, Creatinine, LFTs, hemoglobin, and platelets have been reviewed by M.D. - pt may proceed with treatment.  Primary RN and pharmacy notified.

## 2024-07-13 NOTE — Patient Instructions (Signed)

## 2024-07-13 NOTE — Progress Notes (Signed)
 Labs reviewed ok to treat today per MD.  Treatment given per orders. Patient tolerated it well without problems. Vitals stable and discharged home from clinic ambulatory. Follow up as scheduled.

## 2024-07-17 ENCOUNTER — Ambulatory Visit: Admitting: Radiation Oncology

## 2024-07-27 ENCOUNTER — Inpatient Hospital Stay

## 2024-07-27 VITALS — BP 125/74 | HR 79 | Temp 96.6°F | Resp 18

## 2024-07-27 DIAGNOSIS — C189 Malignant neoplasm of colon, unspecified: Secondary | ICD-10-CM

## 2024-07-27 DIAGNOSIS — Z5112 Encounter for antineoplastic immunotherapy: Secondary | ICD-10-CM | POA: Diagnosis not present

## 2024-07-27 LAB — CBC WITH DIFFERENTIAL/PLATELET
Abs Immature Granulocytes: 0.01 10*3/uL (ref 0.00–0.07)
Basophils Absolute: 0 10*3/uL (ref 0.0–0.1)
Basophils Relative: 1 %
Eosinophils Absolute: 0.4 10*3/uL (ref 0.0–0.5)
Eosinophils Relative: 10 %
HCT: 36 % (ref 36.0–46.0)
Hemoglobin: 11.4 g/dL — ABNORMAL LOW (ref 12.0–15.0)
Immature Granulocytes: 0 %
Lymphocytes Relative: 17 %
Lymphs Abs: 0.7 10*3/uL (ref 0.7–4.0)
MCH: 28.9 pg (ref 26.0–34.0)
MCHC: 31.7 g/dL (ref 30.0–36.0)
MCV: 91.1 fL (ref 80.0–100.0)
Monocytes Absolute: 0.5 10*3/uL (ref 0.1–1.0)
Monocytes Relative: 12 %
Neutro Abs: 2.3 10*3/uL (ref 1.7–7.7)
Neutrophils Relative %: 60 %
Platelets: 98 10*3/uL — ABNORMAL LOW (ref 150–400)
RBC: 3.95 MIL/uL (ref 3.87–5.11)
RDW: 18.4 % — ABNORMAL HIGH (ref 11.5–15.5)
WBC: 3.9 10*3/uL — ABNORMAL LOW (ref 4.0–10.5)
nRBC: 0 % (ref 0.0–0.2)

## 2024-07-27 LAB — COMPREHENSIVE METABOLIC PANEL WITH GFR
ALT: 29 U/L (ref 0–44)
AST: 46 U/L — ABNORMAL HIGH (ref 15–41)
Albumin: 3.4 g/dL — ABNORMAL LOW (ref 3.5–5.0)
Alkaline Phosphatase: 181 U/L — ABNORMAL HIGH (ref 38–126)
Anion gap: 12 (ref 5–15)
BUN: 11 mg/dL (ref 8–23)
CO2: 26 mmol/L (ref 22–32)
Calcium: 8.7 mg/dL — ABNORMAL LOW (ref 8.9–10.3)
Chloride: 105 mmol/L (ref 98–111)
Creatinine, Ser: 0.66 mg/dL (ref 0.44–1.00)
GFR, Estimated: >60 mL/min
Glucose, Bld: 179 mg/dL — ABNORMAL HIGH (ref 70–99)
Potassium: 3.5 mmol/L (ref 3.5–5.1)
Sodium: 142 mmol/L (ref 135–145)
Total Bilirubin: 0.8 mg/dL (ref 0.0–1.2)
Total Protein: 6.1 g/dL — ABNORMAL LOW (ref 6.5–8.1)

## 2024-07-27 LAB — MAGNESIUM: Magnesium: 1.8 mg/dL (ref 1.7–2.4)

## 2024-07-27 MED ORDER — SODIUM CHLORIDE 0.9 % IV SOLN: INTRAVENOUS | Status: DC

## 2024-07-27 MED ORDER — ATROPINE SULFATE 1 MG/ML IV SOLN
0.5000 mg | Freq: Once | INTRAVENOUS | Status: AC | PRN
  Administered 2024-07-27: 0.5 mg via INTRAVENOUS
  Filled 2024-07-27: qty 1

## 2024-07-27 MED ORDER — DEXAMETHASONE SOD PHOSPHATE PF 10 MG/ML IJ SOLN
10.0000 mg | Freq: Once | INTRAMUSCULAR | Status: AC
  Administered 2024-07-27: 10 mg via INTRAVENOUS
  Filled 2024-07-27: qty 1

## 2024-07-27 MED ORDER — SODIUM CHLORIDE 0.9 % IV SOLN
6.0000 mg/kg | Freq: Once | INTRAVENOUS | Status: AC
  Administered 2024-07-27: 400 mg via INTRAVENOUS
  Filled 2024-07-27: qty 20

## 2024-07-27 MED ORDER — SODIUM CHLORIDE 0.9 % IV SOLN
180.0000 mg/m2 | Freq: Once | INTRAVENOUS | Status: AC
  Administered 2024-07-27: 300 mg via INTRAVENOUS
  Filled 2024-07-27: qty 15

## 2024-07-27 MED ORDER — PALONOSETRON HCL INJECTION 0.25 MG/5ML
0.2500 mg | Freq: Once | INTRAVENOUS | Status: AC
  Administered 2024-07-27: 0.25 mg via INTRAVENOUS
  Filled 2024-07-27: qty 5

## 2024-07-27 NOTE — Progress Notes (Signed)
" °   07/27/24 1000  Spiritual Encounters  Type of Visit Follow up  Care provided to: Patient  Referral source Chaplain assessment  Reason for visit Routine spiritual support  OnCall Visit No   Reason for Visit: Chaplain making scheduled follow-up with Pt I previously connected with.  Description of Visit: I entered the room and found Jamylah sitting in the chair receiving her treatment, with no one there as her support person.  Her brother often comes with her, be Evangeline states, I gave him the day off.   When I entered the room Avory was doing something on her phone and she continued fiddling with it while I spoke.  She also remarked that she was having a hard time hearing (there was another Pt in the room and the television was also on and they were talking).  Pt seemed overwhelmed and was giving non-verbal clues that she wasn't open to talking at this time.   I politely concluded the conversation and will re-visit at next appointment.   Plan of Care: I will continue to follow [place] on a monthly basis.   Maude Roll, MDiv Chaplain, [facility] [email or web address] [phone] 07/27/2024 11:02 AM  "

## 2024-07-27 NOTE — Patient Instructions (Signed)
 [facility] [place] - A DEPT OF [facility]  Discharge Instructions: Thank you for choosing [facility] to provide your oncology and hematology care.  If you have a lab appointment with the Cancer Center - please note that after April 8th, 2024, all labs will be drawn in the cancer center.  You do not have to check in or register with the main entrance as you have in the past but will complete your check-in in the cancer center.  Wear comfortable clothing and clothing appropriate for easy access to any Portacath or PICC line.   We strive to give you quality time with your provider. You may need to reschedule your appointment if you arrive late (15 or more minutes).  Arriving late affects you and other patients whose appointments are after yours.  Also, if you miss three or more appointments without notifying the office, you may be dismissed from the clinic at the providers discretion.      For prescription refill requests, have your pharmacy contact our office and allow 72 hours for refills to be completed.    Today you received the following chemotherapy and/or immunotherapy agents Panitumumab , irinotecan    To help prevent nausea and vomiting after your treatment, we encourage you to take your nausea medication as directed.  BELOW ARE SYMPTOMS THAT SHOULD BE REPORTED IMMEDIATELY: *FEVER GREATER THAN 100.4 F (38 C) OR HIGHER *CHILLS OR SWEATING *NAUSEA AND VOMITING THAT IS NOT CONTROLLED WITH YOUR NAUSEA MEDICATION *UNUSUAL SHORTNESS OF BREATH *UNUSUAL BRUISING OR BLEEDING *URINARY PROBLEMS (pain or burning when urinating, or frequent urination) *BOWEL PROBLEMS (unusual diarrhea, constipation, pain near the anus) TENDERNESS IN MOUTH AND THROAT WITH OR WITHOUT PRESENCE OF ULCERS (sore throat, sores in mouth, or a toothache) UNUSUAL RASH, SWELLING OR PAIN  UNUSUAL VAGINAL DISCHARGE OR ITCHING   Items with * indicate a potential emergency and should be  followed up as soon as possible or go to the Emergency Department if any problems should occur.  Please show the CHEMOTHERAPY ALERT CARD or IMMUNOTHERAPY ALERT CARD at check-in to the Emergency Department and triage nurse.  Should you have questions after your visit or need to cancel or reschedule your appointment, please contact [facility] [place] - A DEPT OF [facility] [phone]  and follow the prompts.  Office hours are 8:00 a.m. to 4:30 p.m. Monday - Friday. Please note that voicemails left after 4:00 p.m. may not be returned until the following business day.  We are closed weekends and major holidays. You have access to a nurse at all times for urgent questions. Please call the main number to the clinic [phone] and follow the prompts.  For any non-urgent questions, you may also contact your provider using MyChart. We now offer e-Visits for anyone 53 and older to request care online for non-urgent symptoms. For details visit [email or web address].   Also download the MyChart app! Go to the app store, search MyChart, open the app, select [place], and log in with your MyChart username and password.

## 2024-07-27 NOTE — Progress Notes (Signed)
 Labs reviewed today, platelets are 98, ok to proceed with treatment today per MD

## 2024-07-28 ENCOUNTER — Other Ambulatory Visit: Payer: Self-pay | Admitting: Radiation Oncology

## 2024-07-28 LAB — CEA: CEA: 14.4 ng/mL — ABNORMAL HIGH (ref 0.0–4.7)

## 2024-08-02 NOTE — Progress Notes (Signed)
 Genova Kiner                                          MRN: [id number]   08/02/2024   The [facility] Quality Team Specialist reviewed this patient medical record for the purposes of chart review for care gap closure. The following were reviewed: chart review for care gap closure-kidney health evaluation for diabetes:eGFR  and uACR.    [facility] Quality Team

## 2024-08-03 ENCOUNTER — Other Ambulatory Visit: Payer: Self-pay | Admitting: Oncology

## 2024-08-04 ENCOUNTER — Encounter: Payer: Self-pay | Admitting: Oncology

## 2024-08-10 ENCOUNTER — Inpatient Hospital Stay: Attending: Hematology

## 2024-08-10 ENCOUNTER — Inpatient Hospital Stay

## 2024-08-10 ENCOUNTER — Inpatient Hospital Stay: Admitting: Oncology

## 2024-08-21 ENCOUNTER — Other Ambulatory Visit (HOSPITAL_COMMUNITY)

## 2024-08-24 ENCOUNTER — Inpatient Hospital Stay

## 2024-08-28 ENCOUNTER — Ambulatory Visit: Admitting: Radiation Oncology

## 2024-08-31 ENCOUNTER — Ambulatory Visit (HOSPITAL_COMMUNITY)

## 2024-09-07 ENCOUNTER — Inpatient Hospital Stay: Attending: Hematology | Admitting: Oncology

## 2024-09-07 ENCOUNTER — Inpatient Hospital Stay

## 2024-10-27 ENCOUNTER — Ambulatory Visit
